# Patient Record
Sex: Male | Born: 1935 | Race: White | Hispanic: No | Marital: Married | Smoking: Former smoker
Health system: Southern US, Community
[De-identification: ages and names within clinical notes are randomized; demographics above are authoritative.]

## PROBLEM LIST (undated history)

## (undated) DIAGNOSIS — K5792 Diverticulitis of intestine, part unspecified, without perforation or abscess without bleeding: Secondary | ICD-10-CM

## (undated) DIAGNOSIS — I5022 Chronic systolic (congestive) heart failure: Secondary | ICD-10-CM

## (undated) DIAGNOSIS — C9210 Chronic myeloid leukemia, BCR/ABL-positive, not having achieved remission: Secondary | ICD-10-CM

## (undated) DIAGNOSIS — T4145XA Adverse effect of unspecified anesthetic, initial encounter: Secondary | ICD-10-CM

## (undated) DIAGNOSIS — Z8719 Personal history of other diseases of the digestive system: Secondary | ICD-10-CM

## (undated) DIAGNOSIS — I255 Ischemic cardiomyopathy: Secondary | ICD-10-CM

## (undated) DIAGNOSIS — N183 Chronic kidney disease, stage 3 unspecified: Secondary | ICD-10-CM

## (undated) DIAGNOSIS — E785 Hyperlipidemia, unspecified: Secondary | ICD-10-CM

## (undated) DIAGNOSIS — K2270 Barrett's esophagus without dysplasia: Secondary | ICD-10-CM

## (undated) DIAGNOSIS — I6529 Occlusion and stenosis of unspecified carotid artery: Secondary | ICD-10-CM

## (undated) DIAGNOSIS — T8859XA Other complications of anesthesia, initial encounter: Secondary | ICD-10-CM

## (undated) DIAGNOSIS — G4730 Sleep apnea, unspecified: Secondary | ICD-10-CM

## (undated) DIAGNOSIS — R490 Dysphonia: Secondary | ICD-10-CM

## (undated) DIAGNOSIS — I2510 Atherosclerotic heart disease of native coronary artery without angina pectoris: Secondary | ICD-10-CM

## (undated) DIAGNOSIS — Z87442 Personal history of urinary calculi: Secondary | ICD-10-CM

## (undated) DIAGNOSIS — J381 Polyp of vocal cord and larynx: Secondary | ICD-10-CM

## (undated) DIAGNOSIS — K631 Perforation of intestine (nontraumatic): Secondary | ICD-10-CM

## (undated) HISTORY — DX: Polyp of vocal cord and larynx: J38.1

## (undated) HISTORY — DX: Barrett's esophagus without dysplasia: K22.70

## (undated) HISTORY — DX: Occlusion and stenosis of unspecified carotid artery: I65.29

## (undated) HISTORY — PX: CHOLECYSTECTOMY: SHX55

## (undated) HISTORY — PX: BOWEL RESECTION: SHX1257

## (undated) HISTORY — PX: LAPAROSCOPIC CHOLECYSTECTOMY: SUR755

## (undated) HISTORY — PX: EYE SURGERY: SHX253

## (undated) HISTORY — DX: Dysphonia: R49.0

## (undated) HISTORY — PX: CAROTID ENDARTERECTOMY: SUR193

## (undated) HISTORY — PX: INGUINAL HERNIA REPAIR: SHX194

---
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
## 2009-06-07 ENCOUNTER — Emergency Department (HOSPITAL_BASED_OUTPATIENT_CLINIC_OR_DEPARTMENT_OTHER): Admission: EM | Admit: 2009-06-07 | Discharge: 2009-06-07 | Payer: Self-pay | Admitting: Emergency Medicine

## 2012-11-15 ENCOUNTER — Inpatient Hospital Stay (HOSPITAL_BASED_OUTPATIENT_CLINIC_OR_DEPARTMENT_OTHER)
Admission: EM | Admit: 2012-11-15 | Discharge: 2012-11-20 | DRG: 871 | Disposition: A | Discharge status: Home / Self Care | Payer: Medicare Other | Attending: Internal Medicine | Admitting: Internal Medicine

## 2012-11-15 ENCOUNTER — Emergency Department (HOSPITAL_BASED_OUTPATIENT_CLINIC_OR_DEPARTMENT_OTHER): Payer: Medicare Other

## 2012-11-15 ENCOUNTER — Encounter (HOSPITAL_BASED_OUTPATIENT_CLINIC_OR_DEPARTMENT_OTHER): Payer: Self-pay | Admitting: *Deleted

## 2012-11-15 DIAGNOSIS — E860 Dehydration: Secondary | ICD-10-CM

## 2012-11-15 DIAGNOSIS — Z66 Do not resuscitate: Secondary | ICD-10-CM | POA: Diagnosis present

## 2012-11-15 DIAGNOSIS — E872 Acidosis, unspecified: Secondary | ICD-10-CM | POA: Diagnosis present

## 2012-11-15 DIAGNOSIS — N170 Acute kidney failure with tubular necrosis: Secondary | ICD-10-CM | POA: Diagnosis present

## 2012-11-15 DIAGNOSIS — R197 Diarrhea, unspecified: Secondary | ICD-10-CM | POA: Diagnosis present

## 2012-11-15 DIAGNOSIS — K5732 Diverticulitis of large intestine without perforation or abscess without bleeding: Secondary | ICD-10-CM

## 2012-11-15 DIAGNOSIS — N179 Acute kidney failure, unspecified: Secondary | ICD-10-CM

## 2012-11-15 DIAGNOSIS — A419 Sepsis, unspecified organism: Principal | ICD-10-CM | POA: Diagnosis present

## 2012-11-15 DIAGNOSIS — K5792 Diverticulitis of intestine, part unspecified, without perforation or abscess without bleeding: Secondary | ICD-10-CM

## 2012-11-15 DIAGNOSIS — E876 Hypokalemia: Secondary | ICD-10-CM

## 2012-11-15 LAB — URINALYSIS, ROUTINE W REFLEX MICROSCOPIC
Glucose, UA: NEGATIVE mg/dL
Ketones, ur: 15 mg/dL — AB
Nitrite: NEGATIVE
Protein, ur: 100 mg/dL — AB
Specific Gravity, Urine: 1.02 (ref 1.005–1.030)
Urobilinogen, UA: 1 mg/dL (ref 0.0–1.0)
pH: 5 (ref 5.0–8.0)

## 2012-11-15 LAB — CBC WITH DIFFERENTIAL/PLATELET
Band Neutrophils: 9 % (ref 0–10)
Basophils Absolute: 0 10*3/uL (ref 0.0–0.1)
Basophils Relative: 0 % (ref 0–1)
Eosinophils Absolute: 0 10*3/uL (ref 0.0–0.7)
Eosinophils Relative: 0 % (ref 0–5)
HCT: 52 % (ref 39.0–52.0)
Hemoglobin: 18.3 g/dL — ABNORMAL HIGH (ref 13.0–17.0)
Lymphocytes Relative: 8 % — ABNORMAL LOW (ref 12–46)
Lymphs Abs: 0.5 10*3/uL — ABNORMAL LOW (ref 0.7–4.0)
MCH: 29.1 pg (ref 26.0–34.0)
MCHC: 35.2 g/dL (ref 30.0–36.0)
MCV: 82.7 fL (ref 78.0–100.0)
Monocytes Absolute: 0.8 10*3/uL (ref 0.1–1.0)
Monocytes Relative: 13 % — ABNORMAL HIGH (ref 3–12)
Neutro Abs: 5.2 10*3/uL (ref 1.7–7.7)
Neutrophils Relative %: 70 % (ref 43–77)
Platelets: 220 10*3/uL (ref 150–400)
RBC: 6.29 MIL/uL — ABNORMAL HIGH (ref 4.22–5.81)
RDW: 14.9 % (ref 11.5–15.5)
WBC: 6.5 10*3/uL (ref 4.0–10.5)

## 2012-11-15 LAB — COMPREHENSIVE METABOLIC PANEL
ALT: 51 U/L (ref 0–53)
AST: 225 U/L — ABNORMAL HIGH (ref 0–37)
Albumin: 3.7 g/dL (ref 3.5–5.2)
Alkaline Phosphatase: 138 U/L — ABNORMAL HIGH (ref 39–117)
BUN: 56 mg/dL — ABNORMAL HIGH (ref 6–23)
CO2: 14 mEq/L — ABNORMAL LOW (ref 19–32)
Calcium: 8.2 mg/dL — ABNORMAL LOW (ref 8.4–10.5)
Chloride: 93 mEq/L — ABNORMAL LOW (ref 96–112)
Creatinine, Ser: 4.4 mg/dL — ABNORMAL HIGH (ref 0.50–1.35)
GFR calc Af Amer: 13 mL/min — ABNORMAL LOW (ref >90)
GFR calc non Af Amer: 11 mL/min — ABNORMAL LOW (ref >90)
Glucose, Bld: 125 mg/dL — ABNORMAL HIGH (ref 70–99)
Potassium: 3 mEq/L — ABNORMAL LOW (ref 3.5–5.1)
Sodium: 131 mEq/L — ABNORMAL LOW (ref 135–145)
Total Bilirubin: 0.7 mg/dL (ref 0.3–1.2)
Total Protein: 8.3 g/dL (ref 6.0–8.3)

## 2012-11-15 LAB — URINE MICROSCOPIC-ADD ON

## 2012-11-15 LAB — LACTIC ACID, PLASMA: Lactic Acid, Venous: 2.1 mmol/L (ref 0.5–2.2)

## 2012-11-15 LAB — LIPASE, BLOOD: Lipase: 12 U/L (ref 11–59)

## 2012-11-15 IMAGING — CT CT ABD-PELV W/O CM
2 of 4 series · 15 of 46 positions shown, 17 images · non-contrast
Comparison: Report from CT of the abdomen and pelvis performed
[DATE]

CLINICAL DATA: Vomiting for 3 days.

CT ABDOMEN AND PELVIS WITHOUT CONTRAST
TECHNIQUE: Multidetector CT imaging of the abdomen and pelvis was
performed following the standard protocol without intravenous
contrast.

[Series 2: abd/pelvis 5.0 b31f · axial · 0.73mm/px · z∈[-468,-38]mm · 12 of 94 slices shown, 14 images]
[im 4/94  soft-tissue]
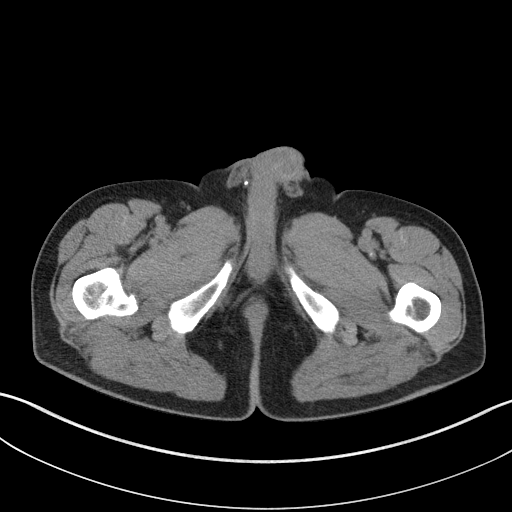
[im 4/94  bone]
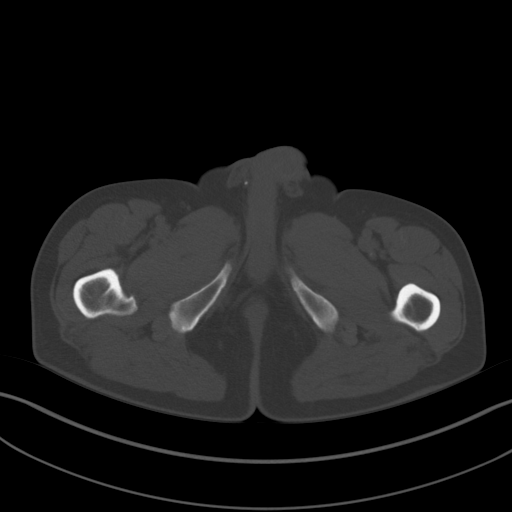
[im 12/94  soft-tissue]
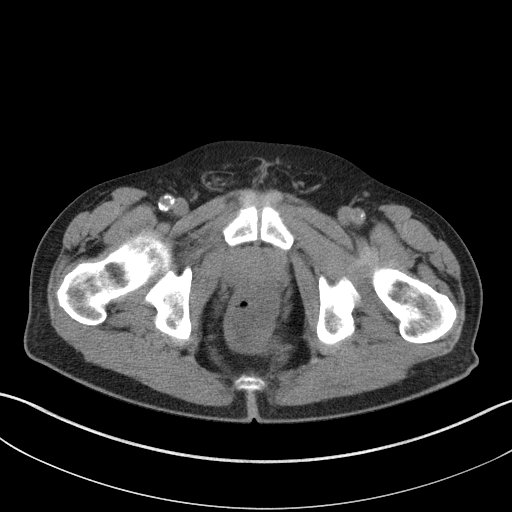
[im 20/94  soft-tissue]
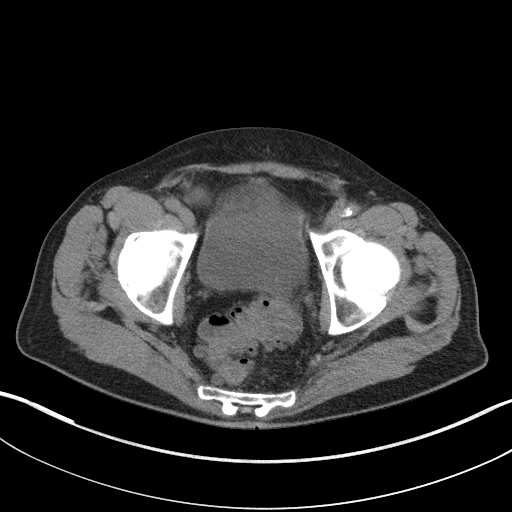
[im 28/94  soft-tissue]
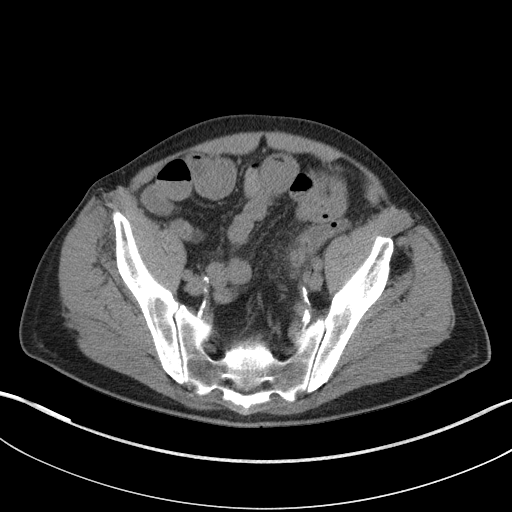
[im 35/94  soft-tissue]
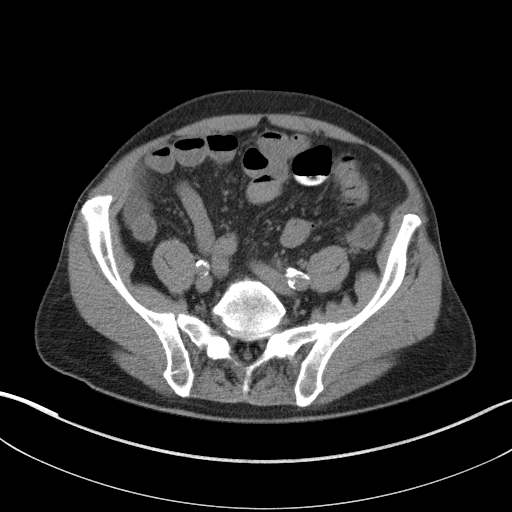
[im 43/94  soft-tissue]
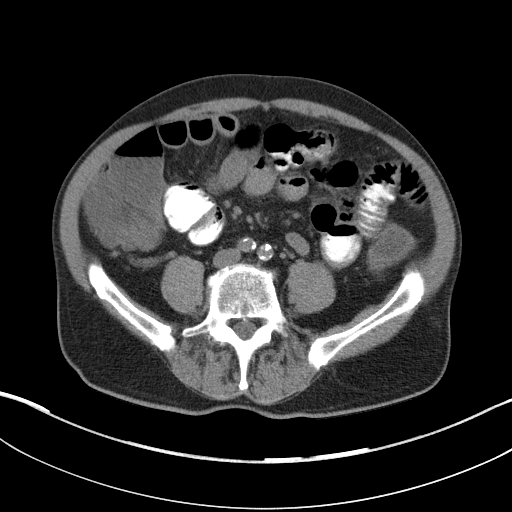
[im 51/94  soft-tissue]
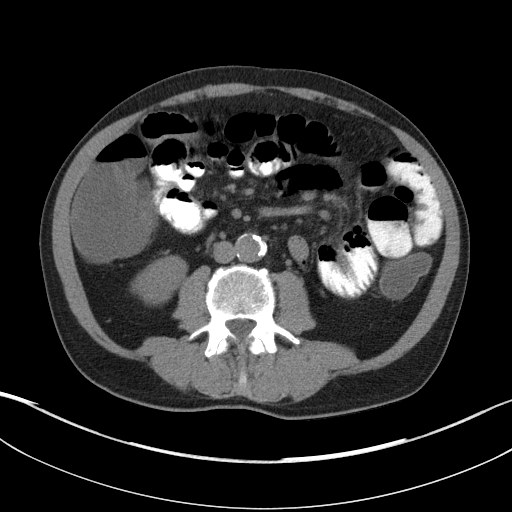
[im 59/94  soft-tissue]
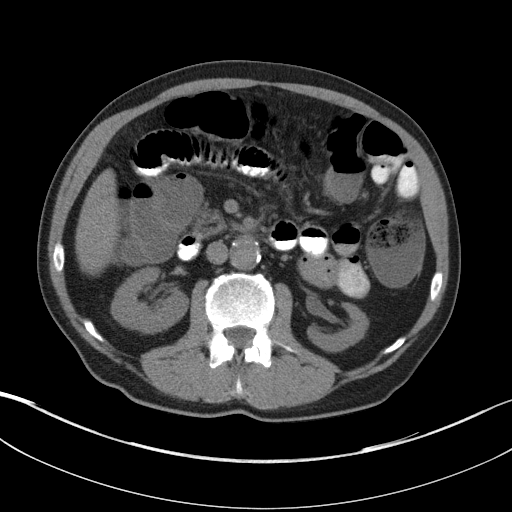
[im 66/94  soft-tissue]
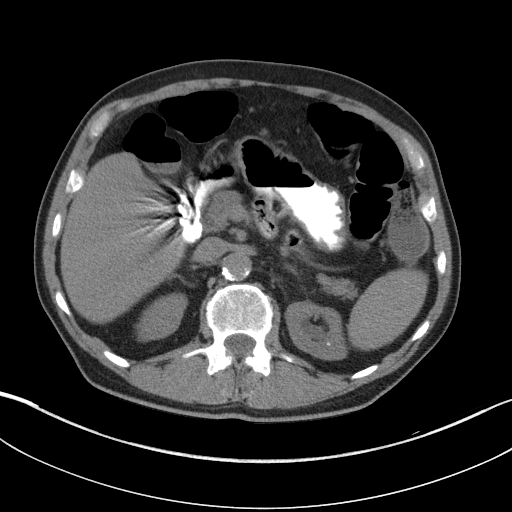
[im 66/94  bone]
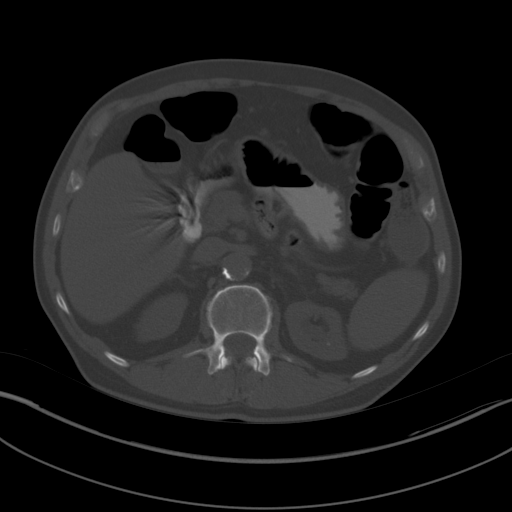
[im 74/94  soft-tissue]
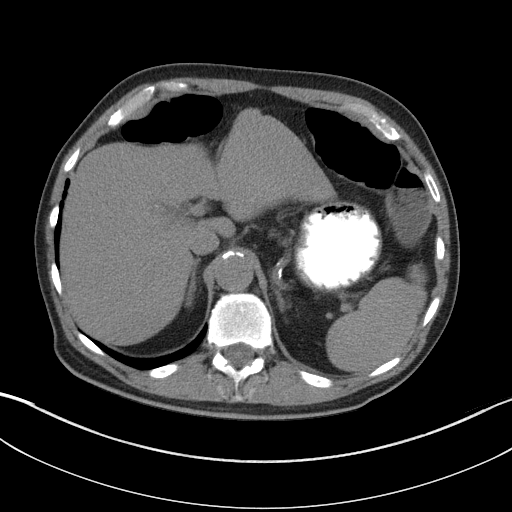
[im 82/94  soft-tissue]
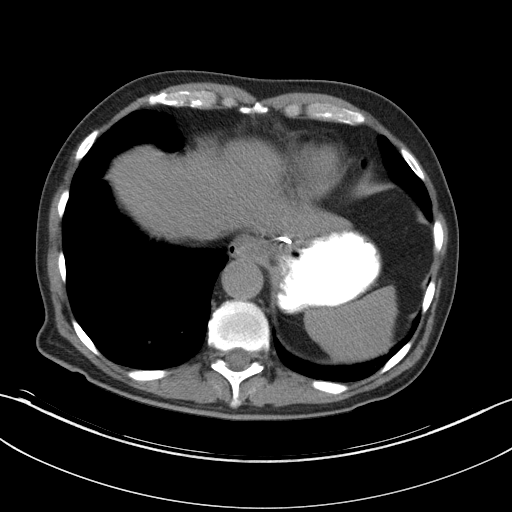
[im 90/94  soft-tissue]
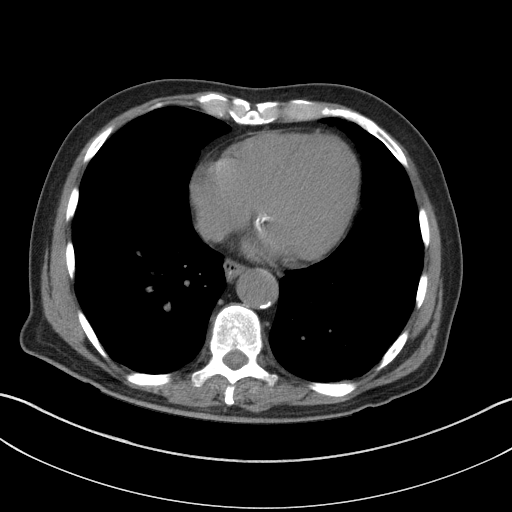

[Series 5: abd/pelvis 3.0 coronal · coronal · 0.88mm/px · 3 of 86 slices shown]
[im 29/86  soft-tissue]
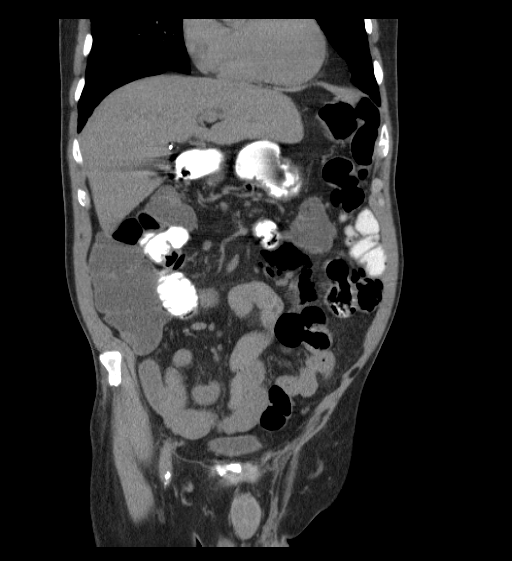
[im 38/86  soft-tissue]
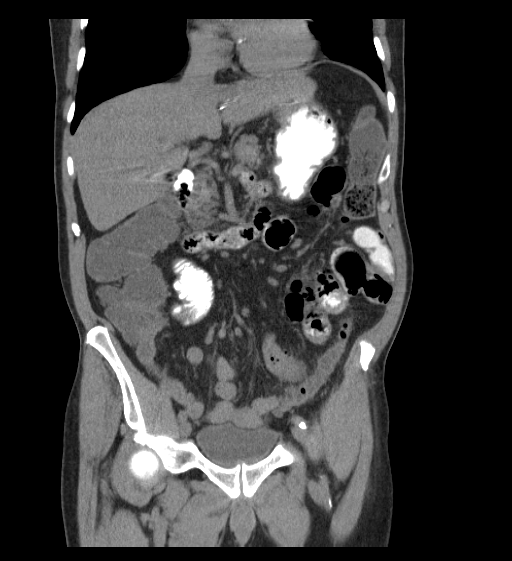
[im 48/86  soft-tissue]
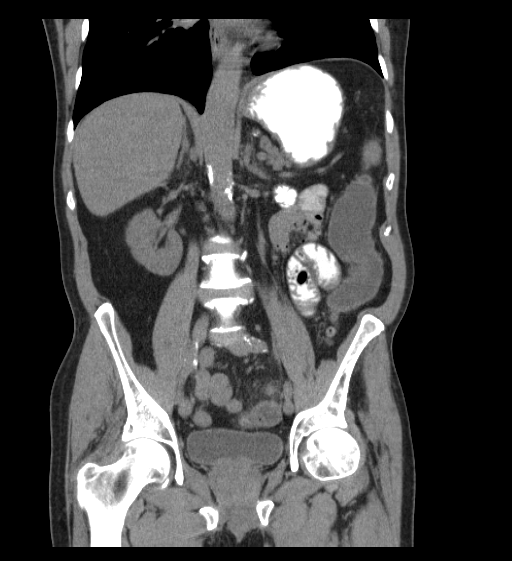

[15 of 46 positions shown; findings below may reference images not displayed]

FINDINGS: Minimal bibasilar atelectasis is noted; scattered
emphysematous change is seen at the lung bases.  Mild scattered
coronary artery calcifications are noted.  Postoperative change is
noted along the gastroesophageal junction.

The liver and spleen are unremarkable in appearance. The patient is
status post cholecystectomy, with clips noted at the gallbladder
fossa.  The slightly decreased attenuation at the pancreatic head
is thought to reflect beam hardening artifact from adjacent
contrast.  The pancreas and adrenal glands are grossly unremarkable
in appearance.

Scattered small left renal stones are seen, measuring up to 6 mm in
size.  Mild left-sided renal pelvicaliectasis remains within normal
limits, and likely reflects the inflammatory process of the sigmoid
colon impressing against the distal left ureter. The right kidney
is grossly unremarkable in appearance.  Minimal nonspecific right-
sided perinephric stranding is seen.

The small bowel is unremarkable in appearance.  The stomach is
within normal limits.  No acute vascular abnormalities are seen.
Numerous scattered mildly prominent mesenteric nodes are seen,
nonspecific in appearance.  Relatively diffuse calcification is
noted along the abdominal aorta and its branches, with mild ectasia
of the distal abdominal aorta.

The appendix is normal in caliber, without evidence for
appendicitis.

There is focal soft tissue inflammation noted along the mid sigmoid
colon, with associated inflamed diverticula, compatible with mild
acute diverticulitis.  Additional diverticulosis is seen scattered
about the sigmoid colon.  The colon is largely filled with fluid
and is otherwise unremarkable in appearance.  There is no evidence
of perforation or abscess formation.  No significant free fluid is
seen.

The bladder is mildly distended and grossly unremarkable.  The
prostate is borderline enlarged, measuring 4.9 cm in transverse
dimension.  No inguinal lymphadenopathy is seen.

No acute osseous abnormalities are identified.  Vacuum phenomenon
and disc space narrowing are noted at L4-L5, with mild associated
endplate sclerotic change.  An associated disc protrusion is seen,
without evidence of impression on exiting nerve roots.
IMPRESSION: 1.  Mild acute diverticulitis noted along the mid sigmoid colon,
with associated focal soft tissue inflammation.  No evidence of
perforation or abscess formation; no free fluid seen.
2.  Diverticulosis scattered about the sigmoid colon.
3.  Relatively diffuse calcification along the abdominal aorta and
its branches.
4.  Scattered small left renal stones seen, measuring up to 6 mm in
size.  Mild left-sided pelvicaliectasis appears to reflect the
inflammatory process at the sigmoid colon impressing against the
distal left ureter; no evidence of hydronephrosis.
5.  Mild scattered coronary artery calcifications seen.
6.  Scattered emphysematous change at the lung bases.
7.  Borderline enlarged prostate.

## 2012-11-15 MED ORDER — ONDANSETRON HCL 4 MG/2ML IJ SOLN
4.0000 mg | Freq: Once | INTRAMUSCULAR | Status: AC
  Administered 2012-11-15: 4 mg via INTRAVENOUS
  Filled 2012-11-15: qty 2

## 2012-11-15 MED ORDER — SODIUM CHLORIDE 0.9 % IV BOLUS (SEPSIS)
1000.0000 mL | Freq: Once | INTRAVENOUS | Status: AC
  Administered 2012-11-15: 1000 mL via INTRAVENOUS

## 2012-11-15 MED ORDER — METRONIDAZOLE IN NACL 5-0.79 MG/ML-% IV SOLN
500.0000 mg | Freq: Once | INTRAVENOUS | Status: AC
  Administered 2012-11-16: 500 mg via INTRAVENOUS
  Filled 2012-11-15: qty 100

## 2012-11-15 MED ORDER — MORPHINE SULFATE 4 MG/ML IJ SOLN
4.0000 mg | Freq: Once | INTRAMUSCULAR | Status: AC
  Administered 2012-11-15: 4 mg via INTRAVENOUS
  Filled 2012-11-15: qty 1

## 2012-11-15 MED ORDER — IOHEXOL 300 MG/ML  SOLN
25.0000 mL | INTRAMUSCULAR | Status: AC
  Administered 2012-11-15 (×2): 25 mL via ORAL

## 2012-11-15 MED ORDER — SODIUM CHLORIDE 0.9 % IV BOLUS (SEPSIS): 1000.0000 mL | Freq: Once | INTRAVENOUS | Status: DC

## 2012-11-15 MED ORDER — CIPROFLOXACIN IN D5W 400 MG/200ML IV SOLN
400.0000 mg | Freq: Once | INTRAVENOUS | Status: AC
  Administered 2012-11-15: 400 mg via INTRAVENOUS
  Filled 2012-11-15: qty 200

## 2012-11-15 MED ORDER — SODIUM CHLORIDE 0.9 % IV SOLN: INTRAVENOUS | Status: DC

## 2012-11-15 NOTE — ED Notes (Signed)
Patient transported to CT 

## 2012-11-15 NOTE — ED Notes (Signed)
Kathi Der, phone [phone].  Call when we know the dispo pla

## 2012-11-15 NOTE — ED Notes (Signed)
Pt to triage in w/c with wife reporting pt with vomiting x 3 days, seen at [facility] and given medications. Cont with vomiting, unable to retain po.

## 2012-11-15 NOTE — ED Notes (Signed)
Patient finished w/ oral contrast, tolerated well.

## 2012-11-15 NOTE — ED Provider Notes (Addendum)
History     CSN: [id number]  Arrival date & time 11/15/12  1845   First MD Initiated Contact with Patient 11/15/12 1922      Chief Complaint  Patient presents with  . Emesis    (Consider location/radiation/quality/duration/timing/severity/associated sxs/prior treatment) Patient is a 78 y.o. male presenting with vomiting and abdominal pain. The history is provided by the patient and the spouse.  Emesis  This is a new problem. Episode onset: 3 days ago. The problem occurs 2 to 4 times per day. The problem has not changed since onset.The emesis has an appearance of stomach contents. There has been no fever. Associated symptoms include abdominal pain, chills and diarrhea. Associated symptoms comments: No bloody stools. Risk factors: No ill contacts, bad food exposure or, recent travel or antibiotics.  Abdominal Pain The primary symptoms of the illness include abdominal pain, vomiting and diarrhea.  The abdominal pain began yesterday. The abdominal pain has been gradually worsening since its onset. The abdominal pain is located in the LLQ and suprapubic region. The abdominal pain does not radiate. The severity of the abdominal pain is 8/10. The abdominal pain is relieved by nothing. The abdominal pain is exacerbated by movement and eating.  Additional symptoms associated with the illness include chills and anorexia. Symptoms associated with the illness do not include urgency, frequency or back pain.    History reviewed. No pertinent past medical history.  History reviewed. No pertinent past surgical history.  History reviewed. No pertinent family history.  History  Substance Use Topics  . Smoking status: Not on file  . Smokeless tobacco: Not on file  . Alcohol Use: Not on file      Review of Systems  Constitutional: Positive for chills.  Gastrointestinal: Positive for vomiting, abdominal pain, diarrhea and anorexia.  Genitourinary: Negative for urgency and frequency.    Musculoskeletal: Negative for back pain.  All other systems reviewed and are negative.    Allergies  Review of patient's allergies indicates no known allergies.  Home Medications  No current outpatient prescriptions on file.  BP 114/70  Pulse 88  Resp 20  SpO2 98%  Physical Exam  Constitutional: He is oriented to person, place, and time. He appears well-developed and well-nourished. He appears distressed.  HENT:  Head: Normocephalic and atraumatic.  Right Ear: External ear normal.  Left Ear: External ear normal.  Mouth/Throat: Oropharynx is clear and moist. Mucous membranes are dry.  Eyes: Conjunctivae normal and EOM are normal. Pupils are equal, round, and reactive to light. Right eye exhibits no discharge.  Neck: Normal range of motion. Neck supple.  Cardiovascular: Normal rate, regular rhythm, normal heart sounds and intact distal pulses.   No murmur heard. Pulmonary/Chest: Tachypnea noted. No respiratory distress. He has no decreased breath sounds. He has no wheezes. He has no rhonchi. He has no rales.  Abdominal: Soft. Normal appearance and bowel sounds are normal. He exhibits distension. There is tenderness in the suprapubic area and left lower quadrant. There is guarding. There is no rebound and no CVA tenderness.  Musculoskeletal: Normal range of motion. He exhibits no edema and no tenderness.  Neurological: He is alert and oriented to person, place, and time.  Skin: Skin is warm and dry. No rash noted.  Psychiatric: He has a normal mood and affect.    ED Course  Procedures (including critical care time)  Labs Reviewed  CBC WITH DIFFERENTIAL - Abnormal; Notable for the following:    RBC 6.29 (*)  Hemoglobin 18.3 (*)     Lymphocytes Relative 8 (*)     Monocytes Relative 13 (*)     Lymphs Abs 0.5 (*)     All other components within normal limits  COMPREHENSIVE METABOLIC PANEL - Abnormal; Notable for the following:    Sodium 131 (*)     Potassium 3.0 (*)      Chloride 93 (*)     CO2 14 (*)     Glucose, Bld 125 (*)     BUN 56 (*)     Creatinine, Ser 4.40 (*)     Calcium 8.2 (*)     AST 225 (*)     Alkaline Phosphatase 138 (*)     GFR calc non Af Amer 11 (*)     GFR calc Af Amer 13 (*)     All other components within normal limits  URINALYSIS, ROUTINE W REFLEX MICROSCOPIC - Abnormal; Notable for the following:    Color, Urine AMBER (*)  BIOCHEMICALS MAY BE AFFECTED BY COLOR   APPearance TURBID (*)     Hgb urine dipstick LARGE (*)     Bilirubin Urine MODERATE (*)     Ketones, ur 15 (*)     Protein, ur 100 (*)     Leukocytes, UA SMALL (*)     All other components within normal limits  URINE MICROSCOPIC-ADD ON - Abnormal; Notable for the following:    Bacteria, UA FEW (*)     Casts GRANULAR CAST (*)  HYALINE CASTS   All other components within normal limits  LIPASE, BLOOD  LACTIC ACID, PLASMA  CLOSTRIDIUM DIFFICILE BY PCR  CLOSTRIDIUM DIFFICILE BY PCR   Ct Abdomen Pelvis Wo Contrast  11/15/2012  *RADIOLOGY REPORT*  Clinical Data: Vomiting for 3 days.  CT ABDOMEN AND PELVIS WITHOUT CONTRAST  Technique:  Multidetector CT imaging of the abdomen and pelvis was performed following the standard protocol without intravenous contrast.  Comparison: Report from CT of the abdomen and pelvis performed 04/08/2010  Findings: Minimal bibasilar atelectasis is noted; scattered emphysematous change is seen at the lung bases.  Mild scattered coronary artery calcifications are noted.  Postoperative change is noted along the gastroesophageal junction.  The liver and spleen are unremarkable in appearance. The patient is status post cholecystectomy, with clips noted at the gallbladder fossa.  The slightly decreased attenuation at the pancreatic head is thought to reflect beam hardening artifact from adjacent contrast.  The pancreas and adrenal glands are grossly unremarkable in appearance.  Scattered small left renal stones are seen, measuring up to 6 mm in size.   Mild left-sided renal pelvicaliectasis remains within normal limits, and likely reflects the inflammatory process of the sigmoid colon impressing against the distal left ureter. The right kidney is grossly unremarkable in appearance.  Minimal nonspecific right- sided perinephric stranding is seen.  The small bowel is unremarkable in appearance.  The stomach is within normal limits.  No acute vascular abnormalities are seen. Numerous scattered mildly prominent mesenteric nodes are seen, nonspecific in appearance.  Relatively diffuse calcification is noted along the abdominal aorta and its branches, with mild ectasia of the distal abdominal aorta.  The appendix is normal in caliber, without evidence for appendicitis.  There is focal soft tissue inflammation noted along the mid sigmoid colon, with associated inflamed diverticula, compatible with mild acute diverticulitis.  Additional diverticulosis is seen scattered about the sigmoid colon.  The colon is largely filled with fluid and is otherwise unremarkable in appearance.  There  is no evidence of perforation or abscess formation.  No significant free fluid is seen.  The bladder is mildly distended and grossly unremarkable.  The prostate is borderline enlarged, measuring 4.9 cm in transverse dimension.  No inguinal lymphadenopathy is seen.  No acute osseous abnormalities are identified.  Vacuum phenomenon and disc space narrowing are noted at L4-L5, with mild associated endplate sclerotic change.  An associated disc protrusion is seen, without evidence of impression on exiting nerve roots.  IMPRESSION:  1.  Mild acute diverticulitis noted along the mid sigmoid colon, with associated focal soft tissue inflammation.  No evidence of perforation or abscess formation; no free fluid seen. 2.  Diverticulosis scattered about the sigmoid colon. 3.  Relatively diffuse calcification along the abdominal aorta and its branches. 4.  Scattered small left renal stones seen, measuring  up to 6 mm in size.  Mild left-sided pelvicaliectasis appears to reflect the inflammatory process at the sigmoid colon impressing against the distal left ureter; no evidence of hydronephrosis. 5.  Mild scattered coronary artery calcifications seen. 6.  Scattered emphysematous change at the lung bases. 7.  Borderline enlarged prostate.   Original Report Authenticated By: Tonia Ghent, M.D.    CRITICAL CARE Performed by: Gwyneth Sprout   Total critical care time: 30  Critical care time was exclusive of separately billable procedures and treating other patients.  Critical care was necessary to treat or prevent imminent or life-threatening deterioration.  Critical care was time spent personally by me on the following activities: development of treatment plan with patient and/or surrogate as well as nursing, discussions with consultants, evaluation of patient's response to treatment, examination of patient, obtaining history from patient or surrogate, ordering and performing treatments and interventions, ordering and review of laboratory studies, ordering and review of radiographic studies, pulse oximetry and re-evaluation of patient's condition.   1. Acute renal failure   2. Diverticulitis   3. Dehydration       MDM   Patient with nausea, vomiting, diarrhea and abdominal pain for the last 3 days that's worsening. He was seen at urgent care earlier today and given an IM injection of pain and nausea medicine however he continues to feel badly eating here. Patient takes no medications and has no past medical history. He is status post cholecystectomy but states he still has his appendix. He denies any recent antibiotic use. On exam he is dehydrated with a left lower corner tenderness that is significant. Concern for diverticulitis versus extraction versus appendicitis versus UTI. Patient given IV fluids. CBC, CMP, lipase, UA, lactic acid, CT of the abdomen and pelvis pending.  11:30  PM Patient found to be in acute renal failure, also elevation in his liver enzymes which I also feel is from dehydration. CT showing acute diverticulitis which would be the cause for his lower abdominal pain. Patient was started on Cipro and Flagyl. He has no risk factors for C. difficile however he did have a loose stool here and it was sent for C. difficile and stool PCR. He is feeling better after IV fluids however due to the above issues he is receiving his third liter of fluid and he will be admitted for further care.      Gwyneth Sprout, MD 11/15/12 2252  Gwyneth Sprout, MD 11/15/12 1610  Gwyneth Sprout, MD 11/15/12 2340

## 2012-11-15 NOTE — Progress Notes (Addendum)
78 yo M 3 day history of N/V/D, mild diverticulitis, acute kidney failure.  No h/o CKD.  Initial labs c/w ATN and severe dehydration.

## 2012-11-16 ENCOUNTER — Inpatient Hospital Stay (HOSPITAL_COMMUNITY): Payer: Medicare Other

## 2012-11-16 ENCOUNTER — Encounter (HOSPITAL_COMMUNITY): Payer: Self-pay | Admitting: *Deleted

## 2012-11-16 DIAGNOSIS — E876 Hypokalemia: Secondary | ICD-10-CM | POA: Diagnosis present

## 2012-11-16 DIAGNOSIS — K5792 Diverticulitis of intestine, part unspecified, without perforation or abscess without bleeding: Secondary | ICD-10-CM | POA: Diagnosis present

## 2012-11-16 DIAGNOSIS — N179 Acute kidney failure, unspecified: Secondary | ICD-10-CM | POA: Diagnosis present

## 2012-11-16 DIAGNOSIS — E860 Dehydration: Secondary | ICD-10-CM | POA: Diagnosis present

## 2012-11-16 LAB — CBC
HCT: 43.8 % (ref 39.0–52.0)
Hemoglobin: 15.1 g/dL (ref 13.0–17.0)
MCH: 29.2 pg (ref 26.0–34.0)
MCHC: 34.5 g/dL (ref 30.0–36.0)
MCV: 84.7 fL (ref 78.0–100.0)
Platelets: 211 10*3/uL (ref 150–400)
RBC: 5.17 MIL/uL (ref 4.22–5.81)
RDW: 14.1 % (ref 11.5–15.5)
WBC: 13.9 10*3/uL — ABNORMAL HIGH (ref 4.0–10.5)

## 2012-11-16 LAB — BASIC METABOLIC PANEL
BUN: 59 mg/dL — ABNORMAL HIGH (ref 6–23)
CO2: 15 mEq/L — ABNORMAL LOW (ref 19–32)
Calcium: 7.6 mg/dL — ABNORMAL LOW (ref 8.4–10.5)
Chloride: 94 mEq/L — ABNORMAL LOW (ref 96–112)
Creatinine, Ser: 3.89 mg/dL — ABNORMAL HIGH (ref 0.50–1.35)
GFR calc Af Amer: 15 mL/min — ABNORMAL LOW (ref >90)
GFR calc non Af Amer: 13 mL/min — ABNORMAL LOW (ref >90)
Glucose, Bld: 92 mg/dL (ref 70–99)
Potassium: 3.2 mEq/L — ABNORMAL LOW (ref 3.5–5.1)
Sodium: 130 mEq/L — ABNORMAL LOW (ref 135–145)

## 2012-11-16 LAB — TSH: TSH: 4.564 u[IU]/mL — ABNORMAL HIGH (ref 0.350–4.500)

## 2012-11-16 LAB — CLOSTRIDIUM DIFFICILE BY PCR
Toxigenic C. Difficile by PCR: NEGATIVE
Toxigenic C. Difficile by PCR: NEGATIVE

## 2012-11-16 LAB — PROTIME-INR
INR: 1.38 (ref 0.00–1.49)
Prothrombin Time: 16.6 seconds — ABNORMAL HIGH (ref 11.6–15.2)

## 2012-11-16 LAB — CREATININE, URINE, RANDOM: Creatinine, Urine: 223.36 mg/dL

## 2012-11-16 LAB — MAGNESIUM: Magnesium: 1 mg/dL — ABNORMAL LOW (ref 1.5–2.5)

## 2012-11-16 LAB — SODIUM, URINE, RANDOM: Sodium, Ur: 17 mEq/L

## 2012-11-16 IMAGING — US US RENAL
1 series · 14 of 25 positions shown · non-contrast
Comparison: CT [DATE]

CLINICAL DATA: Acute renal failure.

RENAL/URINARY TRACT ULTRASOUND COMPLETE

[Series 1: us renal · 0.30mm/px · 14 of 44 slices shown]
[im 1/44]
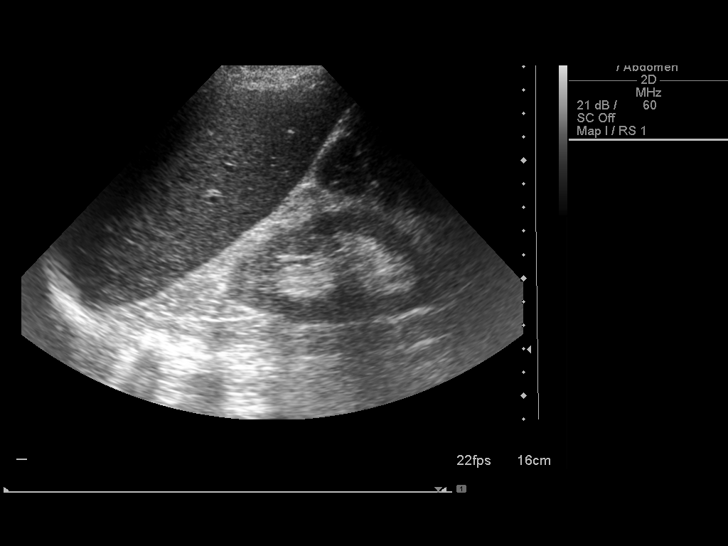
[im 4/44]
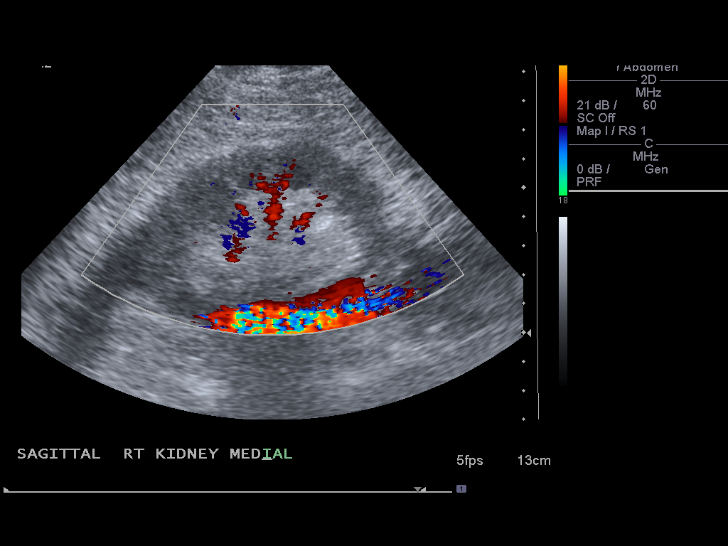
[im 8/44]
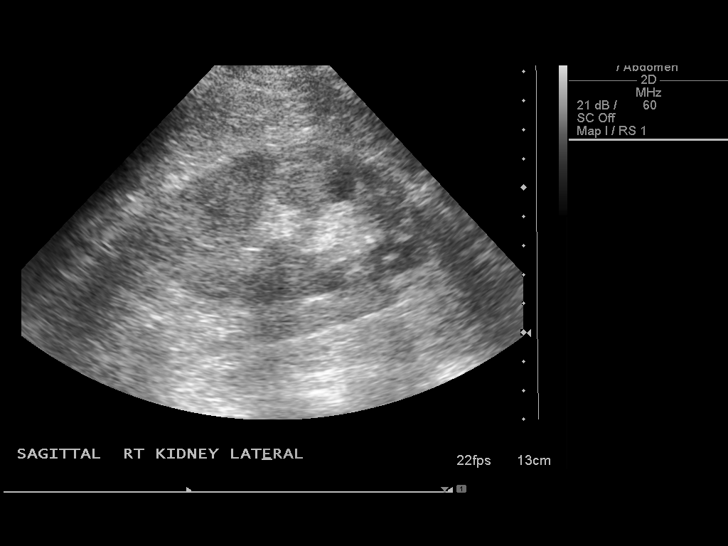
[im 11/44]
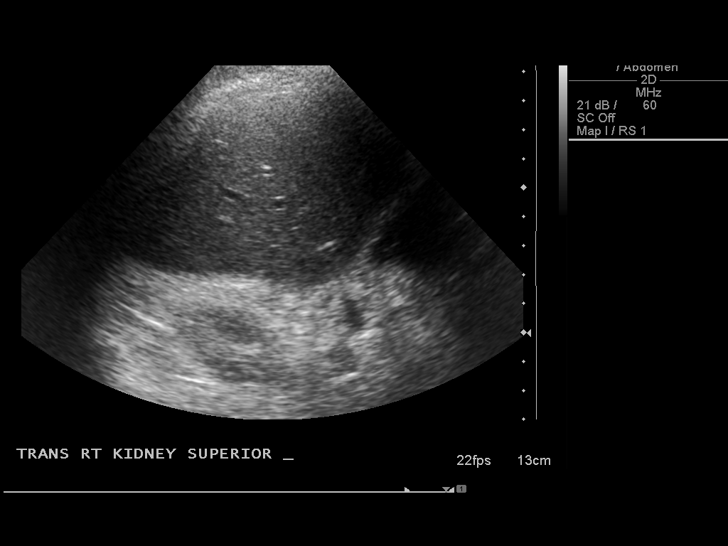
[im 15/44]
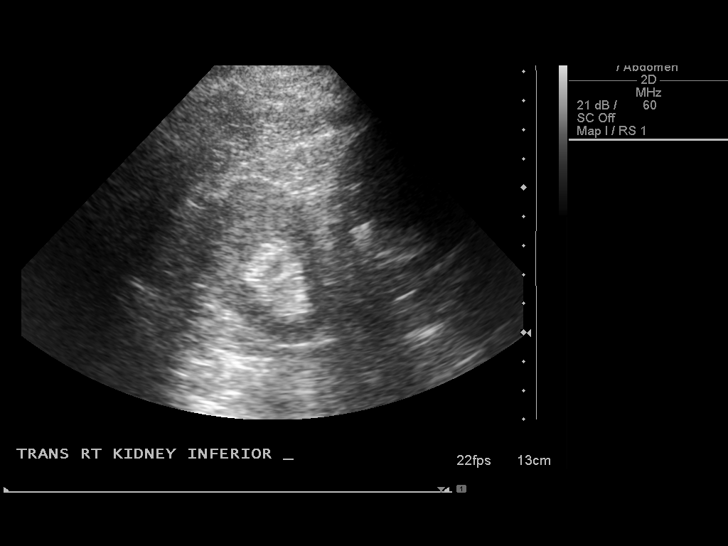
[im 17/44]
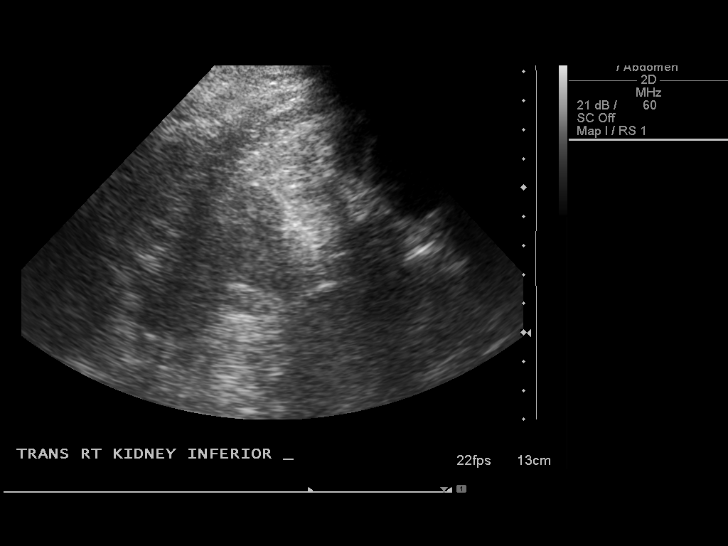
[im 20/44]
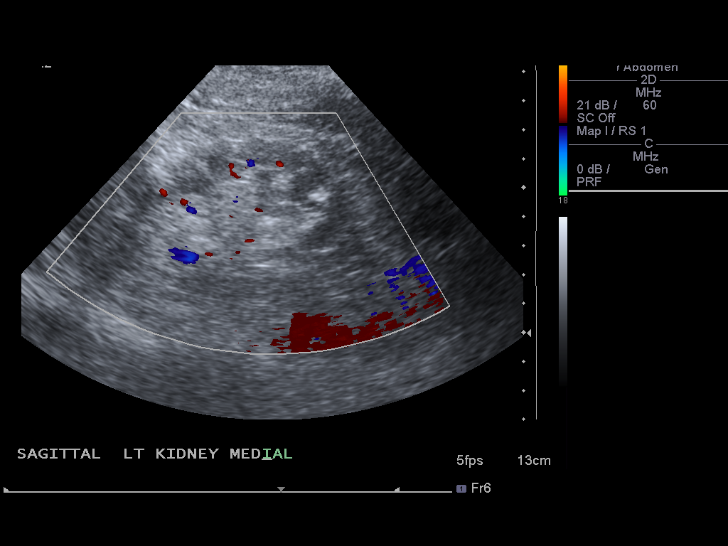
[im 24/44]
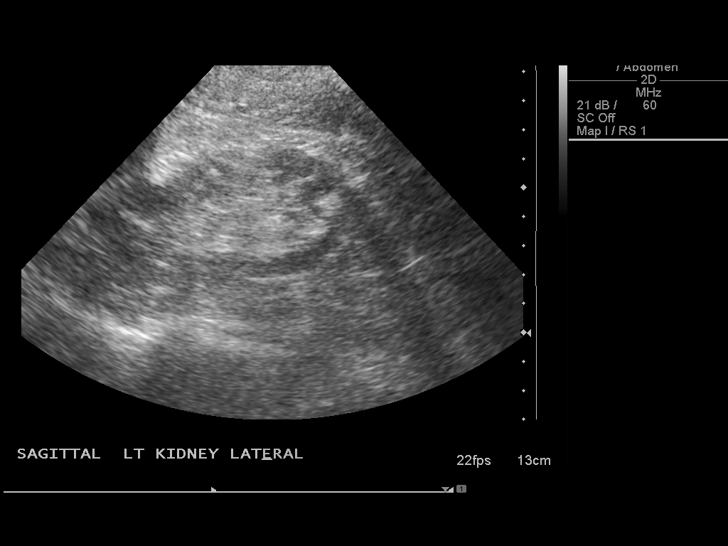
[im 27/44]
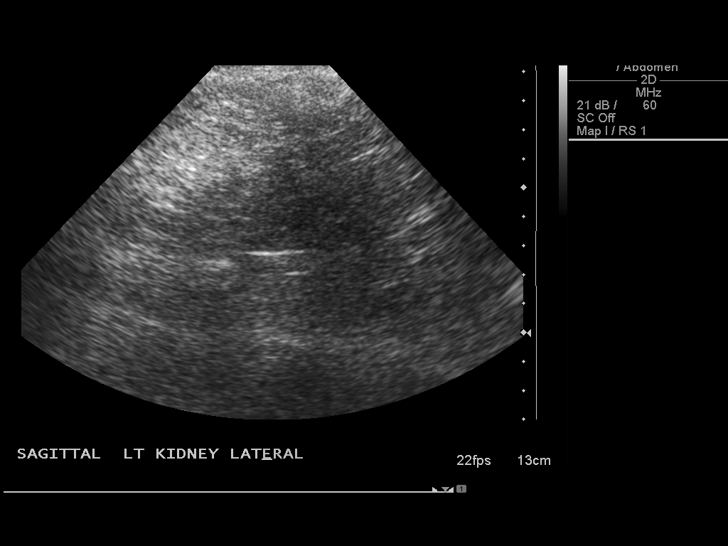
[im 29/44]
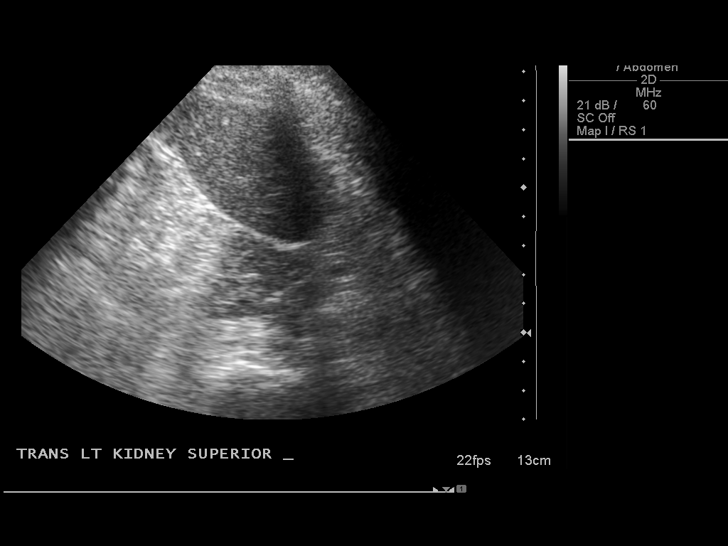
[im 33/44]
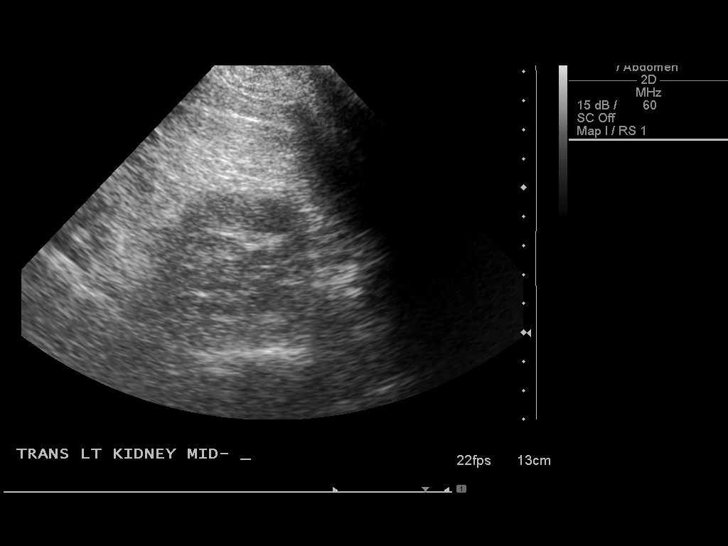
[im 36/44]
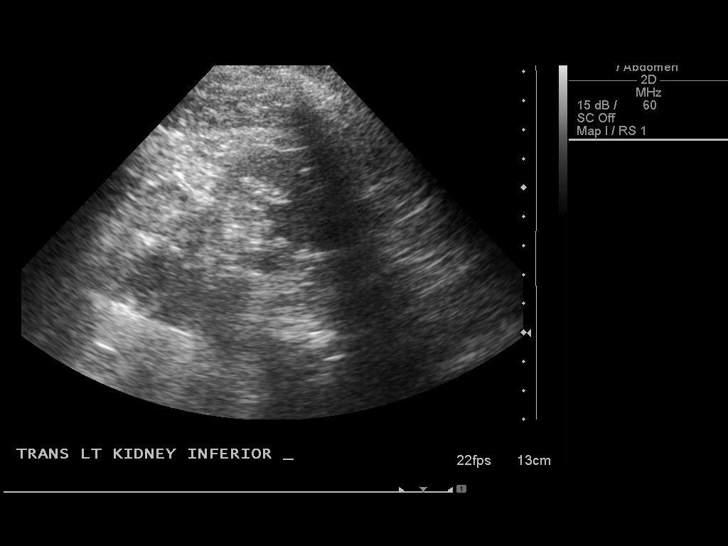
[im 40/44]
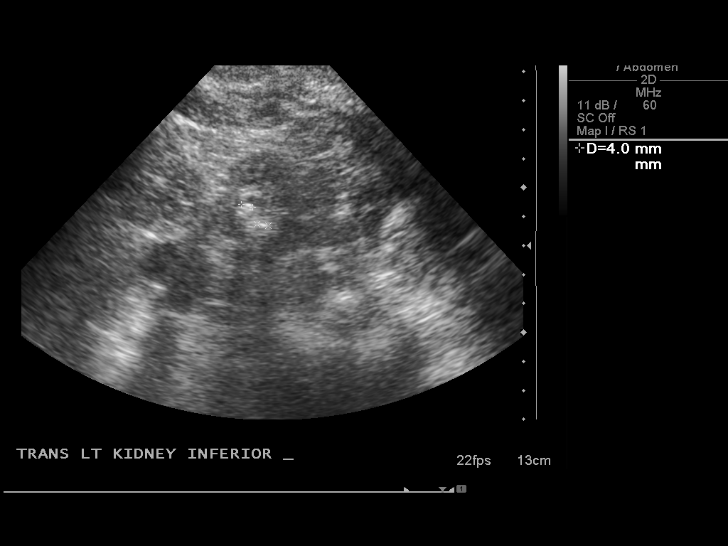
[im 44/44]
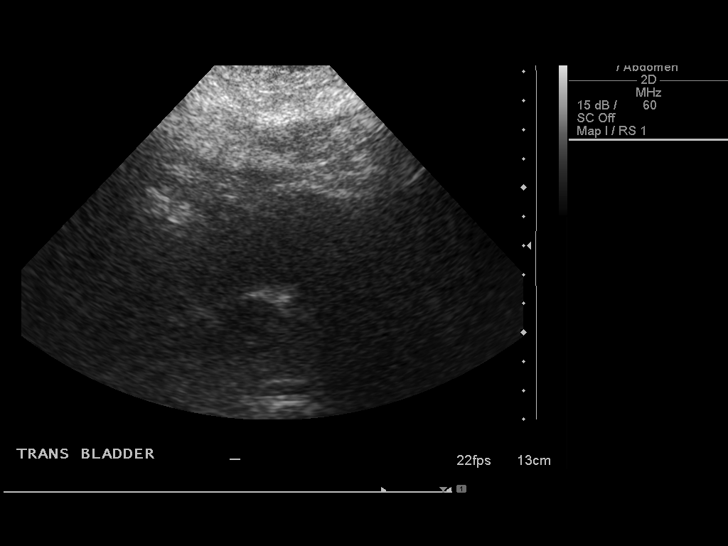

[14 of 25 positions shown; findings below may reference images not displayed]

FINDINGS: Right Kidney:  10.9 cm.  No focal abnormality.  No hydronephrosis.
Normal echotexture.

Left Kidney:  10.0 cm.  Mild cortical thinning.  Echogenic
shadowing foci within the left kidney compatible with
nonobstructing stones as seen on prior CT.  No hydronephrosis.
Normal echotexture.

Bladder:   Decompressed.  Not well visualized.
IMPRESSION: Mild cortical thinning within the left kidney.  Left
nephrolithiasis.  No acute findings.

## 2012-11-16 MED ORDER — POTASSIUM CHLORIDE IN NACL 20-0.9 MEQ/L-% IV SOLN
INTRAVENOUS | Status: DC
  Filled 2012-11-16 (×2): qty 1000

## 2012-11-16 MED ORDER — METRONIDAZOLE IN NACL 5-0.79 MG/ML-% IV SOLN
500.0000 mg | Freq: Three times a day (TID) | INTRAVENOUS | Status: DC
  Administered 2012-11-16 (×3): 500 mg via INTRAVENOUS
  Filled 2012-11-16 (×5): qty 100

## 2012-11-16 MED ORDER — HEPARIN SODIUM (PORCINE) 5000 UNIT/ML IJ SOLN
5000.0000 [IU] | Freq: Three times a day (TID) | INTRAMUSCULAR | Status: DC
  Administered 2012-11-16 – 2012-11-20 (×12): 5000 [IU] via SUBCUTANEOUS
  Filled 2012-11-16 (×15): qty 1

## 2012-11-16 MED ORDER — POTASSIUM CHLORIDE IN NACL 20-0.9 MEQ/L-% IV SOLN: INTRAVENOUS | Status: DC

## 2012-11-16 MED ORDER — POTASSIUM CHLORIDE CRYS ER 20 MEQ PO TBCR
40.0000 meq | EXTENDED_RELEASE_TABLET | Freq: Once | ORAL | Status: AC
  Administered 2012-11-16: 40 meq via ORAL
  Filled 2012-11-16: qty 2

## 2012-11-16 MED ORDER — SODIUM CHLORIDE 0.9 % IV BOLUS (SEPSIS): 500.0000 mL | Freq: Once | INTRAVENOUS | Status: DC

## 2012-11-16 MED ORDER — CIPROFLOXACIN IN D5W 400 MG/200ML IV SOLN
400.0000 mg | Freq: Two times a day (BID) | INTRAVENOUS | Status: DC
  Administered 2012-11-16 (×2): 400 mg via INTRAVENOUS
  Filled 2012-11-16 (×4): qty 200

## 2012-11-16 MED ORDER — HYDROCODONE-ACETAMINOPHEN 5-325 MG PO TABS
1.0000 | ORAL_TABLET | ORAL | Status: DC | PRN
  Administered 2012-11-16 – 2012-11-19 (×5): 1 via ORAL
  Filled 2012-11-16 (×5): qty 1

## 2012-11-16 MED ORDER — SODIUM BICARBONATE 650 MG PO TABS
650.0000 mg | ORAL_TABLET | Freq: Four times a day (QID) | ORAL | Status: DC
  Administered 2012-11-16 – 2012-11-20 (×18): 650 mg via ORAL
  Filled 2012-11-16 (×21): qty 1

## 2012-11-16 MED ORDER — POTASSIUM CHLORIDE IN NACL 20-0.9 MEQ/L-% IV SOLN
INTRAVENOUS | Status: AC
  Administered 2012-11-16: 03:00:00 via INTRAVENOUS
  Filled 2012-11-16: qty 1000

## 2012-11-16 MED ORDER — GUAIFENESIN-DM 100-10 MG/5ML PO SYRP
5.0000 mL | ORAL_SOLUTION | ORAL | Status: DC | PRN
  Filled 2012-11-16: qty 5

## 2012-11-16 MED ORDER — ONDANSETRON HCL 4 MG PO TABS: 4.0000 mg | ORAL_TABLET | Freq: Four times a day (QID) | ORAL | Status: DC | PRN

## 2012-11-16 MED ORDER — ONDANSETRON HCL 4 MG/2ML IJ SOLN: 4.0000 mg | Freq: Four times a day (QID) | INTRAMUSCULAR | Status: DC | PRN

## 2012-11-16 MED ORDER — BOOST / RESOURCE BREEZE PO LIQD
1.0000 | Freq: Three times a day (TID) | ORAL | Status: DC
  Administered 2012-11-16 – 2012-11-19 (×10): 1 via ORAL

## 2012-11-16 MED ORDER — ALBUTEROL SULFATE (5 MG/ML) 0.5% IN NEBU: 2.5000 mg | INHALATION_SOLUTION | RESPIRATORY_TRACT | Status: DC | PRN

## 2012-11-16 NOTE — ED Notes (Addendum)
Disregard previous weight documentation. Wrong patient,weight incorrect.

## 2012-11-16 NOTE — Progress Notes (Signed)
Patient seen and evaluated earlier this AM by my associate.  Please review his H and P for further details.  Patient currently mentions that he feels improved.   ARF: - Creatinine improving on IVF's. Most likely due to prerenal azotemia - Renal u/s pending but Abdominal CT scan mentions the following regarding patient's kidneys:  Scattered small left renal stones are seen, measuring up to 6 mm in  size. Mild left-sided renal pelvicaliectasis remains within normal  limits, and likely reflects the inflammatory process of the sigmoid  colon impressing against the distal left ureter. The right kidney  is grossly unremarkable in appearance. Minimal nonspecific right-  sided perinephric stranding is seen. - Will plan on continuing IVF at this juncture  Diverticulitis - continue antibiotic regimen - CT scan reports no evidence of perforation or abscess formation  Continue to monitor  Joydan Gretzinger, [facility]

## 2012-11-16 NOTE — Progress Notes (Signed)
INITIAL NUTRITION ASSESSMENT  DOCUMENTATION CODES Per approved criteria  -Underweight   INTERVENTION:  Resource Breeze supplement 3 times daily between meals (250 kcals, 9 gm protein per 8 fl oz carton) RD to follow for nutrition care plan  NUTRITION DIAGNOSIS: Inadequate oral intake related to diverticulitis as evidenced by clear liquid diet  Goal: Oral intake with meals & supplements to meet >/= 90% of estimated nutrition needs  Monitor:  PO & supplemental intake, weight, labs, I/O's  Reason for Assessment: Malnutrition Screening Tool Report  78 y.o. male  Admitting Dx: Diverticulitis  ASSESSMENT: Patient admitted with abdominal pain and vomiting; CT scan showed no evidence of perforation or abscess formation; patient sleeping upon RD entry; RD briefly spoke with daughter, however, unable to provide much nutrition hx on the as she lives in [place], [place]; states patient was not eating well 2 days PTA; believes his weight has been stable; would benefit from supplements -- RD to order.  Height: Ht Readings from Last 1 Encounters:  11/16/12 6\' 9"  (2.057 m)    Weight: Wt Readings from Last 1 Encounters:  11/16/12 145 lb 4.5 oz (65.9 kg)    Ideal Body Weight: 105.4 kg  % Ideal Body Weight: 63%  Wt Readings from Last 10 Encounters:  11/16/12 145 lb 4.5 oz (65.9 kg)    Usual Body Weight: ~ 150 lb  % Usual Body Weight: 97%  BMI:  Body mass index is 15.57 kg/(m^2).  Estimated Nutritional Needs: Kcal: 2000-2200 Protein: 100-110 gm Fluid: 2.0-2.2 L  Skin: Intact  Diet Order: Clear Liquid  EDUCATION NEEDS: -No education needs identified at this time   Intake/Output Summary (Last 24 hours) at 11/16/12 1509 Last data filed at 11/16/12 1300  Gross per 24 hour  Intake    560 ml  Output      3 ml  Net    557 ml    Last BM: 12/28  Labs:   Lab 11/16/12 0500 11/15/12 2100  NA 130* 131*  K 3.2* 3.0*  CL 94* 93*  CO2 15* 14*  BUN 59* 56*  CREATININE  3.89* 4.40*  CALCIUM 7.6* 8.2*  MG 1.0* --  PHOS -- --  GLUCOSE 92 125*    Scheduled Meds:   . ciprofloxacin  400 mg Intravenous BID  . heparin  5,000 Units Subcutaneous Q8H  . metronidazole  500 mg Intravenous Q8H  . sodium bicarbonate  650 mg Oral QID  . sodium chloride  1,000 mL Intravenous Once  . sodium chloride  500 mL Intravenous Once    Continuous Infusions:   History reviewed. No pertinent past medical history.  History reviewed. No pertinent past surgical history.  Kirkland Hun, RD, LDN Pager #: [phone] After-Hours Pager #: [phone]

## 2012-11-16 NOTE — H&P (Addendum)
[facility]                                                                                    Patient Demographics  [name] [name], is a 78 y.o. male  CSN: [id number]  MRN: [id number]  DOB - January 10, 1930  Admit Date - 11/15/2012  Outpatient Primary MD for the patient is No primary provider on file.   With History of -  History reviewed. No pertinent past medical history.    History reviewed. No pertinent past surgical history.  in for   Chief Complaint  Patient presents with  . Emesis     HPI  [name] [name]  is a 78 y.o. male, who is in excellent health and has no past medical problems all home medications, had last been lab work at primary care physician's office one year ago and it was normal, he apparently went out with family members on Tuesday to have breakfast at a [employer], following which he started developing abdominal pain and diarrhea with some emesis since yesterday, his abdominal pain is in the lower quadrants, it is intermittent and crampy in nature, associated with nausea vomiting diarrhea, nonradiating pain with no aggravating or relieving factors, he has been feeling thirsty and making less urine today, he [facility] as his symptoms are not getting better where he was diagnosed with diverticulitis on CT scan along with dehydration and acute renal failure.   Patient denies any fever chills, denies any exposure to sick contacts or antibiotics recently. Denies any previous history of renal problems.    Review of Systems    In addition to the HPI above, No Fever-chills, No Headache, No changes with Vision or hearing, No problems swallowing food or Liquids, No Chest pain, Cough or Shortness of Breath, No Blood in stool or Urine, No dysuria, No new skin rashes or bruises, No new joints pains-aches,  No new weakness, tingling, numbness in any extremity, No recent weight gain or loss, No polyuria, polydypsia or  polyphagia, No significant Mental Stressors.  A full 10 point Review of Systems was done, except as stated above, all other Review of Systems were negative.   Social History History  Substance Use Topics  . Smoking status: Not on file  . Smokeless tobacco: Not on file  . Alcohol Use: Not on file     Family History No history of colon cancer or renal failure  Prior to Admission medications   Not on File    No Known Allergies  Physical Exam  Vitals  Blood pressure 89/56, pulse 92, temperature 98.9 F (37.2 C), temperature source Rectal, resp. rate 14, weight 63.957 kg (141 lb), SpO2 99.00%.   1. General elderly white male lying in bed in NAD,    2. Normal affect and insight, Not Suicidal or Homicidal, Awake Alert, Oriented X 3.  3. No F.N deficits, ALL C.Nerves Intact, Strength 5/5 all 4 extremities, Sensation intact all 4 extremities, Plantars down going.  4. Ears and Eyes appear Normal, Conjunctivae clear, PERRLA. Moist Oral Mucosa.  5. Supple Neck, No JVD, No cervical lymphadenopathy appriciated, No Carotid Bruits.  6. Symmetrical Chest  wall movement, Good air movement bilaterally, CTAB.  7. RRR, No Gallops, Rubs or Murmurs, No Parasternal Heave.  8. Positive Bowel Sounds, Abdomen Soft, some lower quadrant tenderness, No organomegaly appriciated,No rebound -guarding or rigidity.  9.  No Cyanosis, Normal Skin Turgor, No Skin Rash or Bruise.  10. Good muscle tone,  joints appear normal , no effusions, Normal ROM.  11. No Palpable Lymph Nodes in Neck or Axillae    Data Review  CBC  Lab 11/15/12 2100  WBC 6.5  HGB 18.3*  HCT 52.0  PLT 220  MCV 82.7  MCH 29.1  MCHC 35.2  RDW 14.9  LYMPHSABS 0.5*  MONOABS 0.8  EOSABS 0.0  BASOSABS 0.0  BANDABS --   ------------------------------------------------------------------------------------------------------------------  Chemistries   Lab 11/15/12 2100  NA 131*  K 3.0*  CL 93*  CO2 14*  GLUCOSE  125*  BUN 56*  CREATININE 4.40*  CALCIUM 8.2*  MG --  AST 225*  ALT 51  ALKPHOS 138*  BILITOT 0.7   ------------------------------------------------------------------------------------------------------------------ CrCl is unknown because there is no height on file for the current visit. ------------------------------------------------------------------------------------------------------------------ No results found for this basename: TSH,T4TOTAL,FREET3,T3FREE,THYROIDAB in the last 72 hours   Coagulation profile No results found for this basename: INR:5,PROTIME:5 in the last 168 hours ------------------------------------------------------------------------------------------------------------------- No results found for this basename: DDIMER:2 in the last 72 hours -------------------------------------------------------------------------------------------------------------------  Cardiac Enzymes No results found for this basename: CK:3,CKMB:3,TROPONINI:3,MYOGLOBIN:3 in the last 168 hours ------------------------------------------------------------------------------------------------------------------ No components found with this basename: POCBNP:3   ---------------------------------------------------------------------------------------------------------------  Urinalysis    Component Value Date/Time   COLORURINE AMBER* 11/15/2012 2244   APPEARANCEUR TURBID* 11/15/2012 2244   LABSPEC 1.020 11/15/2012 2244   PHURINE 5.0 11/15/2012 2244   GLUCOSEU NEGATIVE 11/15/2012 2244   HGBUR LARGE* 11/15/2012 2244   BILIRUBINUR MODERATE* 11/15/2012 2244   KETONESUR 15* 11/15/2012 2244   PROTEINUR 100* 11/15/2012 2244   UROBILINOGEN 1.0 11/15/2012 2244   NITRITE NEGATIVE 11/15/2012 2244   LEUKOCYTESUR SMALL* 11/15/2012 2244    ----------------------------------------------------------------------------------------------------------------  Imaging results:   Ct Abdomen Pelvis Wo  Contrast  11/15/2012  *RADIOLOGY REPORT*  Clinical Data: Vomiting for 3 days.  CT ABDOMEN AND PELVIS WITHOUT CONTRAST  Technique:  Multidetector CT imaging of the abdomen and pelvis was performed following the standard protocol without intravenous contrast.  Comparison: Report from CT of the abdomen and pelvis performed 04/08/2010  Findings: Minimal bibasilar atelectasis is noted; scattered emphysematous change is seen at the lung bases.  Mild scattered coronary artery calcifications are noted.  Postoperative change is noted along the gastroesophageal junction.  The liver and spleen are unremarkable in appearance. The patient is status post cholecystectomy, with clips noted at the gallbladder fossa.  The slightly decreased attenuation at the pancreatic head is thought to reflect beam hardening artifact from adjacent contrast.  The pancreas and adrenal glands are grossly unremarkable in appearance.  Scattered small left renal stones are seen, measuring up to 6 mm in size.  Mild left-sided renal pelvicaliectasis remains within normal limits, and likely reflects the inflammatory process of the sigmoid colon impressing against the distal left ureter. The right kidney is grossly unremarkable in appearance.  Minimal nonspecific right- sided perinephric stranding is seen.  The small bowel is unremarkable in appearance.  The stomach is within normal limits.  No acute vascular abnormalities are seen. Numerous scattered mildly prominent mesenteric nodes are seen, nonspecific in appearance.  Relatively diffuse calcification is noted along the abdominal aorta and its branches, with mild ectasia of the distal abdominal  aorta.  The appendix is normal in caliber, without evidence for appendicitis.  There is focal soft tissue inflammation noted along the mid sigmoid colon, with associated inflamed diverticula, compatible with mild acute diverticulitis.  Additional diverticulosis is seen scattered about the sigmoid colon.  The  colon is largely filled with fluid and is otherwise unremarkable in appearance.  There is no evidence of perforation or abscess formation.  No significant free fluid is seen.  The bladder is mildly distended and grossly unremarkable.  The prostate is borderline enlarged, measuring 4.9 cm in transverse dimension.  No inguinal lymphadenopathy is seen.  No acute osseous abnormalities are identified.  Vacuum phenomenon and disc space narrowing are noted at L4-L5, with mild associated endplate sclerotic change.  An associated disc protrusion is seen, without evidence of impression on exiting nerve roots.  IMPRESSION:  1.  Mild acute diverticulitis noted along the mid sigmoid colon, with associated focal soft tissue inflammation.  No evidence of perforation or abscess formation; no free fluid seen. 2.  Diverticulosis scattered about the sigmoid colon. 3.  Relatively diffuse calcification along the abdominal aorta and its branches. 4.  Scattered small left renal stones seen, measuring up to 6 mm in size.  Mild left-sided pelvicaliectasis appears to reflect the inflammatory process at the sigmoid colon impressing against the distal left ureter; no evidence of hydronephrosis. 5.  Mild scattered coronary artery calcifications seen. 6.  Scattered emphysematous change at the lung bases. 7.  Borderline enlarged prostate.   Original Report Authenticated By: Tonia Ghent, M.D.       Assessment & Plan   1. Acute diverticulitis causing diarrhea nausea vomiting, dehydration , hypotension with prerenal azotemia and acute renal failure. Plan is to do the patient on MedSurg bed, obtain stool cultures along with C. difficile, for now empiric IV Cipro Flagyl, aggressive IV fluids, repeat CBC BMP in the morning.    2. Acute renal failure due to #1 above. No past history of renal problems, patient on no medications, likely due to dehydration from #1 above, will obtain UA, urine sodium and creatinine, IV fluids, repeat BMP in  the morning, check renal ultrasound.    3. Hypokalemia. Potassium will be replaced and rechecked.    4. Metabolic acidosis due to #2, will replace bicarbonate by mouth, monitor BMP closely, if no rapid improvement changed to IV bicarbonate replacement. We'll try to give normal saline instead of D5 bicarbonate IV as his blood pressure is low.    5. Monitor the above highlighted CT findings around the ureter.     DVT Prophylaxis Heparin    AM Labs Ordered, also please review Full Orders  Family Communication: Admission, patients condition and plan of care including tests being ordered have been discussed with the patient and son who indicate understanding and agree with the plan and Code Status.  Code Status DNR  Disposition Plan: home  Time spent in minutes : 35  Condition [name] [name] K M.D on 11/16/2012 at 2:40 AM  Between 7am to 7pm - Pager - [phone]  After 7pm go to [email or web address] - password [email or web address]  And look for the night coverage person covering me after hours  [facility] Office  [phone]

## 2012-11-17 DIAGNOSIS — A419 Sepsis, unspecified organism: Principal | ICD-10-CM

## 2012-11-17 LAB — BASIC METABOLIC PANEL
BUN: 54 mg/dL — ABNORMAL HIGH (ref 6–23)
CO2: 17 mEq/L — ABNORMAL LOW (ref 19–32)
Calcium: 8 mg/dL — ABNORMAL LOW (ref 8.4–10.5)
Chloride: 98 mEq/L (ref 96–112)
Creatinine, Ser: 3.08 mg/dL — ABNORMAL HIGH (ref 0.50–1.35)
GFR calc Af Amer: 20 mL/min — ABNORMAL LOW (ref >90)
GFR calc non Af Amer: 18 mL/min — ABNORMAL LOW (ref >90)
Glucose, Bld: 82 mg/dL (ref 70–99)
Potassium: 2.6 mEq/L — CL (ref 3.5–5.1)
Sodium: 132 mEq/L — ABNORMAL LOW (ref 135–145)

## 2012-11-17 LAB — CBC
HCT: 43.2 % (ref 39.0–52.0)
Hemoglobin: 15.5 g/dL (ref 13.0–17.0)
MCH: 30.1 pg (ref 26.0–34.0)
MCHC: 35.9 g/dL (ref 30.0–36.0)
MCV: 83.9 fL (ref 78.0–100.0)
Platelets: 200 10*3/uL (ref 150–400)
RBC: 5.15 MIL/uL (ref 4.22–5.81)
RDW: 14.1 % (ref 11.5–15.5)
WBC: 16.1 10*3/uL — ABNORMAL HIGH (ref 4.0–10.5)

## 2012-11-17 LAB — INFLUENZA PANEL BY PCR (TYPE A & B)
H1N1 flu by pcr: NOT DETECTED
Influenza A By PCR: NEGATIVE
Influenza B By PCR: NEGATIVE

## 2012-11-17 LAB — MAGNESIUM: Magnesium: 1.2 mg/dL — ABNORMAL LOW (ref 1.5–2.5)

## 2012-11-17 MED ORDER — POTASSIUM CHLORIDE 20 MEQ/15ML (10%) PO LIQD
40.0000 meq | Freq: Once | ORAL | Status: AC
  Administered 2012-11-17: 40 meq via ORAL
  Filled 2012-11-17: qty 30

## 2012-11-17 MED ORDER — PIPERACILLIN-TAZOBACTAM IN DEX 2-0.25 GM/50ML IV SOLN
2.2500 g | Freq: Four times a day (QID) | INTRAVENOUS | Status: DC
  Administered 2012-11-17 – 2012-11-18 (×5): 2.25 g via INTRAVENOUS
  Filled 2012-11-17 (×8): qty 50

## 2012-11-17 MED ORDER — CHOLESTYRAMINE 4 G PO PACK
4.0000 g | PACK | Freq: Every day | ORAL | Status: DC
  Administered 2012-11-17 – 2012-11-20 (×4): 4 g via ORAL
  Filled 2012-11-17 (×5): qty 1

## 2012-11-17 MED ORDER — MAGNESIUM SULFATE 40 MG/ML IJ SOLN
4.0000 g | Freq: Once | INTRAMUSCULAR | Status: AC
  Administered 2012-11-17: 4 g via INTRAVENOUS
  Filled 2012-11-17: qty 100

## 2012-11-17 MED ORDER — POTASSIUM CHLORIDE 10 MEQ/100ML IV SOLN
10.0000 meq | INTRAVENOUS | Status: AC
  Administered 2012-11-17 (×2): 10 meq via INTRAVENOUS
  Filled 2012-11-17 (×2): qty 100

## 2012-11-17 MED ORDER — SODIUM CHLORIDE 0.9 % IV SOLN
INTRAVENOUS | Status: DC
  Administered 2012-11-17 – 2012-11-19 (×2): via INTRAVENOUS
  Filled 2012-11-17 (×6): qty 1000

## 2012-11-17 NOTE — Progress Notes (Signed)
CRITICAL VALUE ALERT  Critical value received: Potassium 2.6  Date of notification:  11/17/12  Time of notification:  12:58  Critical value read back:yes  Nurse who received alert:  Fayne Norrie, RN  MD notified (1st page):  Dr. Algis Downs. Tat  Time of first page:  1300  MD notified (2nd page):  Time of second page:  Responding MD:  Dr. Algis Downs. Tat  Time MD responded:  1306- Orders placed for IV and PO potassium (see MAR for details).

## 2012-11-17 NOTE — Progress Notes (Signed)
[facility] PROGRESS NOTE  [name] [name] ZOX:[id number] DOB: 28-Dec-1929 DOA: 11/15/2012 PCP: No primary provider on file.  Assessment/Plan: Sepsis -present at time of admission -Secondary to acute diverticulitis -WBC increased today -Blood cultures x2 sets -Empirically broaden antibiotic coverage -Start Zosyn;  discontinue Cipro and Flagyl  Acute diverticulitis  -Appears to be clinically improving with decreased abdominal pain and improved vomiting  -Advance diet  -Continue IV antibiotics  Acute kidney injury  -Likely a combination of ATN and hypovolemia -Continue IV hydration  -Renal ultrasound negative for hydronephrosis  Hypokalemia  -Replete  -Partly due to diarrhea and hypomagnesemia  -Replete magnesium  Diarrhea  -Questran when necessary -Clostridium difficile toxin negative x2    Family Communication:   Daughter at beside Disposition Plan:   Home when medically stable      Antibiotics:  Cipro December 27 >> December 29    Flagyl December 27 >>> December 29    Zosyn December 29 >>>    Procedures/Studies: Ct Abdomen Pelvis Wo Contrast  11/15/2012  *RADIOLOGY REPORT*  Clinical Data: Vomiting for 3 days.  CT ABDOMEN AND PELVIS WITHOUT CONTRAST  Technique:  Multidetector CT imaging of the abdomen and pelvis was performed following the standard protocol without intravenous contrast.  Comparison: Report from CT of the abdomen and pelvis performed 04/08/2010  Findings: Minimal bibasilar atelectasis is noted; scattered emphysematous change is seen at the lung bases.  Mild scattered coronary artery calcifications are noted.  Postoperative change is noted along the gastroesophageal junction.  The liver and spleen are unremarkable in appearance. The patient is status post cholecystectomy, with clips noted at the gallbladder fossa.  The slightly decreased attenuation at the pancreatic head is thought to reflect beam hardening artifact from adjacent contrast.  The  pancreas and adrenal glands are grossly unremarkable in appearance.  Scattered small left renal stones are seen, measuring up to 6 mm in size.  Mild left-sided renal pelvicaliectasis remains within normal limits, and likely reflects the inflammatory process of the sigmoid colon impressing against the distal left ureter. The right kidney is grossly unremarkable in appearance.  Minimal nonspecific right- sided perinephric stranding is seen.  The small bowel is unremarkable in appearance.  The stomach is within normal limits.  No acute vascular abnormalities are seen. Numerous scattered mildly prominent mesenteric nodes are seen, nonspecific in appearance.  Relatively diffuse calcification is noted along the abdominal aorta and its branches, with mild ectasia of the distal abdominal aorta.  The appendix is normal in caliber, without evidence for appendicitis.  There is focal soft tissue inflammation noted along the mid sigmoid colon, with associated inflamed diverticula, compatible with mild acute diverticulitis.  Additional diverticulosis is seen scattered about the sigmoid colon.  The colon is largely filled with fluid and is otherwise unremarkable in appearance.  There is no evidence of perforation or abscess formation.  No significant free fluid is seen.  The bladder is mildly distended and grossly unremarkable.  The prostate is borderline enlarged, measuring 4.9 cm in transverse dimension.  No inguinal lymphadenopathy is seen.  No acute osseous abnormalities are identified.  Vacuum phenomenon and disc space narrowing are noted at L4-L5, with mild associated endplate sclerotic change.  An associated disc protrusion is seen, without evidence of impression on exiting nerve roots.  IMPRESSION:  1.  Mild acute diverticulitis noted along the mid sigmoid colon, with associated focal soft tissue inflammation.  No evidence of perforation or abscess formation; no free fluid seen. 2.  Diverticulosis scattered about the  sigmoid colon. 3.  Relatively diffuse calcification along the abdominal aorta and its branches. 4.  Scattered small left renal stones seen, measuring up to 6 mm in size.  Mild left-sided pelvicaliectasis appears to reflect the inflammatory process at the sigmoid colon impressing against the distal left ureter; no evidence of hydronephrosis. 5.  Mild scattered coronary artery calcifications seen. 6.  Scattered emphysematous change at the lung bases. 7.  Borderline enlarged prostate.   Original Report Authenticated By: Tonia Ghent, M.D.    US Renal  11/16/2012  *RADIOLOGY REPORT*  Clinical Data: Acute renal failure.  RENAL/URINARY TRACT ULTRASOUND COMPLETE  Comparison:  CT 11/15/2012  Findings:  Right Kidney:  10.9 cm.  No focal abnormality.  No hydronephrosis. Normal echotexture.  Left Kidney:  10.0 cm.  Mild cortical thinning.  Echogenic shadowing foci within the left kidney compatible with nonobstructing stones as seen on prior CT.  No hydronephrosis. Normal echotexture.  Bladder:   Decompressed.  Not well visualized.  IMPRESSION: Mild cortical thinning within the left kidney.  Left nephrolithiasis.  No acute findings.   Original Report Authenticated By: Charlett Nose, M.D.          Subjective:  patient is feeling slightly better today. Abdominal pain is improving. No more vomiting. No more nausea. He wants to eat more substantial food. Denies fevers, chills, chest pain, shortness of breath, dysuria, hematuria. No rashes.   Objective: Filed Vitals:   11/16/12 1338 11/16/12 1800 11/16/12 2200 11/17/12 0600  BP: 90/57 99/64 87/57  87/57  Pulse: 83 81 73 73  Temp: 97.5 F (36.4 C) 98.2 F (36.8 C) 98 F (36.7 C) 97.6 F (36.4 C)  TempSrc: Oral Axillary Oral Oral  Resp: 18 18 18 18   Height:      Weight:   67.6 kg (149 lb 0.5 oz)   SpO2: 97% 96% 92% 93%    Intake/Output Summary (Last 24 hours) at 11/17/12 0917 Last data filed at 11/17/12 0700  Gross per 24 hour  Intake   1260 ml    Output    607 ml  Net    653 ml   Weight change: -40.265 kg (-88 lb 12.3 oz) Exam:   General:  Pt is alert, follows commands appropriately, not in acute distress  HEENT: No icterus, No thrush,  [place]/AT  Cardiovascular: RRR, S1/S2, no rubs, no gallops  Respiratory: CTA bilaterally, no wheezing, no crackles, no rhonchi  Abdomen: Soft/+BS,, non distended, no guarding; mild left lower quadrant tenderness without any guarding or peritoneal signs.   Extremities: No edema, No lymphangitis, No petechiae, No rashes, no synovitis  Data Reviewed: Basic Metabolic Panel:  Lab 11/16/12 1610 11/15/12 2100  NA 130* 131*  K 3.2* 3.0*  CL 94* 93*  CO2 15* 14*  GLUCOSE 92 125*  BUN 59* 56*  CREATININE 3.89* 4.40*  CALCIUM 7.6* 8.2*  MG 1.0* --  PHOS -- --   Liver Function Tests:  Lab 11/15/12 2100  AST 225*  ALT 51  ALKPHOS 138*  BILITOT 0.7  PROT 8.3  ALBUMIN 3.7    Lab 11/15/12 2100  LIPASE 12  AMYLASE --   No results found for this basename: AMMONIA:5 in the last 168 hours CBC:  Lab 11/17/12 0739 11/16/12 0500 11/15/12 2100  WBC 16.1* 13.9* 6.5  NEUTROABS -- -- 5.2  HGB 15.5 15.1 18.3*  HCT 43.2 43.8 52.0  MCV 83.9 84.7 82.7  PLT 200 211 220   Cardiac Enzymes: No results found for this basename: CKTOTAL:5,CKMB:5,CKMBINDEX:5,TROPONINI:5  in the last 168 hours BNP: No components found with this basename: POCBNP:5 CBG: No results found for this basename: GLUCAP:5 in the last 168 hours  Recent Results (from the past 240 hour(s))  CLOSTRIDIUM DIFFICILE BY PCR     Status: Normal   Collection Time   11/15/12  9:15 PM      Component Value Range Status Comment   C difficile by pcr NEGATIVE  NEGATIVE Final   CLOSTRIDIUM DIFFICILE BY PCR     Status: Normal   Collection Time   11/16/12  3:20 AM      Component Value Range Status Comment   C difficile by pcr NEGATIVE  NEGATIVE Final      Scheduled Meds:   . feeding supplement  1 Container Oral TID BM  . heparin   5,000 Units Subcutaneous Q8H  . piperacillin-tazobactam (ZOSYN)  IV  2.25 g Intravenous Q6H  . sodium bicarbonate  650 mg Oral QID  . sodium chloride  1,000 mL Intravenous Once  . sodium chloride  500 mL Intravenous Once   Continuous Infusions:   . sodium chloride 0.9 % 1,000 mL with potassium chloride 20 mEq infusion       Fumiye Lubben, DO  [facility] Pager [phone]  If 7PM-7AM, please contact night-coverage [email or web address] Password [email or web address] 11/17/2012, 9:17 AM   LOS: 2 days

## 2012-11-17 NOTE — Progress Notes (Signed)
Late entry: Upon initiation of IV potassium at 13:45, at a rate of 121ml/hr, patient c/o severe pain in Left forearm at site of IV. IV fluid stopped until patient states pain no longer remains. Restarted IV potassium at 65ml/hr. Pt immediately states severe pain at site of IV in left ac. Discontinued left ac IV. Attempted two peripheral IV starts, had charge nurse make 3rd attempt; all unsuccessful. IV team paged for new IV insertion. Prior to new IV establishment pt given PO potassium dose. Dr. Arbutus Leas made aware of pt's inability to tolerate IV potassium and discontinuation of IV. New IV established by IV team in right forearm. IV potassium restarted at 50 ml/hr. Pt tolerated for 30 minutes at that rate and then began to c/o severe pain at IV site in rite forearm. Discontinued potassium and began run of Normal Saline with Magnesium piggy backed. See MAR for details. Patient tolerated well. Will continue to monitor patient closely and relay information to night shift nurse.

## 2012-11-18 LAB — CBC WITH DIFFERENTIAL/PLATELET
Basophils Absolute: 0 10*3/uL (ref 0.0–0.1)
Basophils Relative: 0 % (ref 0–1)
Eosinophils Absolute: 0.2 10*3/uL (ref 0.0–0.7)
Eosinophils Relative: 2 % (ref 0–5)
HCT: 41.5 % (ref 39.0–52.0)
Hemoglobin: 14.4 g/dL (ref 13.0–17.0)
Lymphocytes Relative: 11 % — ABNORMAL LOW (ref 12–46)
Lymphs Abs: 1.1 10*3/uL (ref 0.7–4.0)
MCH: 28.9 pg (ref 26.0–34.0)
MCHC: 34.7 g/dL (ref 30.0–36.0)
MCV: 83.2 fL (ref 78.0–100.0)
Monocytes Absolute: 1.3 10*3/uL — ABNORMAL HIGH (ref 0.1–1.0)
Monocytes Relative: 12 % (ref 3–12)
Neutro Abs: 8.2 10*3/uL — ABNORMAL HIGH (ref 1.7–7.7)
Neutrophils Relative %: 75 % (ref 43–77)
Platelets: 207 10*3/uL (ref 150–400)
RBC: 4.99 MIL/uL (ref 4.22–5.81)
RDW: 14.4 % (ref 11.5–15.5)
WBC: 11 10*3/uL — ABNORMAL HIGH (ref 4.0–10.5)

## 2012-11-18 LAB — BASIC METABOLIC PANEL
BUN: 40 mg/dL — ABNORMAL HIGH (ref 6–23)
CO2: 16 mEq/L — ABNORMAL LOW (ref 19–32)
Calcium: 8.1 mg/dL — ABNORMAL LOW (ref 8.4–10.5)
Chloride: 103 mEq/L (ref 96–112)
Creatinine, Ser: 2.32 mg/dL — ABNORMAL HIGH (ref 0.50–1.35)
GFR calc Af Amer: 29 mL/min — ABNORMAL LOW (ref >90)
GFR calc non Af Amer: 25 mL/min — ABNORMAL LOW (ref >90)
Glucose, Bld: 81 mg/dL (ref 70–99)
Potassium: 3.2 mEq/L — ABNORMAL LOW (ref 3.5–5.1)
Sodium: 135 mEq/L (ref 135–145)

## 2012-11-18 LAB — MAGNESIUM: Magnesium: 2.5 mg/dL (ref 1.5–2.5)

## 2012-11-18 MED ORDER — POTASSIUM CHLORIDE 20 MEQ/15ML (10%) PO LIQD
40.0000 meq | Freq: Once | ORAL | Status: AC
  Administered 2012-11-18: 40 meq via ORAL
  Filled 2012-11-18: qty 30

## 2012-11-18 MED ORDER — PIPERACILLIN-TAZOBACTAM 3.375 G IVPB
3.3750 g | Freq: Three times a day (TID) | INTRAVENOUS | Status: DC
  Administered 2012-11-18 – 2012-11-19 (×3): 3.375 g via INTRAVENOUS
  Filled 2012-11-18 (×5): qty 50

## 2012-11-18 NOTE — Progress Notes (Signed)
[facility] PROGRESS NOTE  [name] [name] NWG:[id number] DOB: [birth year] DOA: 11/15/2012 PCP: No primary provider on file.  Assessment/Plan: Sepsis  -present at time of admission  -Secondary to acute diverticulitis  -WBC improving today  -Blood cultures x2 sets  -Empirically broaden antibiotic coverage  -Start Zosyn; discontinue Cipro and Flagyl  Acute diverticulitis  -Appears to be clinically improving with decreased abdominal pain and improved vomiting  -Advance diet to cardiac -Continue IV antibiotics  Acute kidney injury  -Likely a combination of ATN and hypovolemia  -Continue IV hydration  -Renal ultrasound negative for hydronephrosis  Hypokalemia  -Replete  -Partly due to diarrhea and hypomagnesemia  -Repleted magnesium  Diarrhea  -Questran when necessary  -Clostridium difficile toxin negative x2  Family Communication: wife at beside  Disposition Plan: Home when medically stable  Antibiotics:  Cipro December 27 >> December 29  Flagyl December 27 >>> December 29  Zosyn December 29 >>>      Family Communication:   Pt at beside Disposition Plan:   Home when medically stable     Procedures/Studies: Ct Abdomen Pelvis Wo Contrast  11/15/2012  *RADIOLOGY REPORT*  Clinical Data: Vomiting for 3 days.  CT ABDOMEN AND PELVIS WITHOUT CONTRAST  Technique:  Multidetector CT imaging of the abdomen and pelvis was performed following the standard protocol without intravenous contrast.  Comparison: Report from CT of the abdomen and pelvis performed 04/08/2010  Findings: Minimal bibasilar atelectasis is noted; scattered emphysematous change is seen at the lung bases.  Mild scattered coronary artery calcifications are noted.  Postoperative change is noted along the gastroesophageal junction.  The liver and spleen are unremarkable in appearance. The patient is status post cholecystectomy, with clips noted at the gallbladder fossa.  The slightly decreased attenuation at the  pancreatic head is thought to reflect beam hardening artifact from adjacent contrast.  The pancreas and adrenal glands are grossly unremarkable in appearance.  Scattered small left renal stones are seen, measuring up to 6 mm in size.  Mild left-sided renal pelvicaliectasis remains within normal limits, and likely reflects the inflammatory process of the sigmoid colon impressing against the distal left ureter. The right kidney is grossly unremarkable in appearance.  Minimal nonspecific right- sided perinephric stranding is seen.  The small bowel is unremarkable in appearance.  The stomach is within normal limits.  No acute vascular abnormalities are seen. Numerous scattered mildly prominent mesenteric nodes are seen, nonspecific in appearance.  Relatively diffuse calcification is noted along the abdominal aorta and its branches, with mild ectasia of the distal abdominal aorta.  The appendix is normal in caliber, without evidence for appendicitis.  There is focal soft tissue inflammation noted along the mid sigmoid colon, with associated inflamed diverticula, compatible with mild acute diverticulitis.  Additional diverticulosis is seen scattered about the sigmoid colon.  The colon is largely filled with fluid and is otherwise unremarkable in appearance.  There is no evidence of perforation or abscess formation.  No significant free fluid is seen.  The bladder is mildly distended and grossly unremarkable.  The prostate is borderline enlarged, measuring 4.9 cm in transverse dimension.  No inguinal lymphadenopathy is seen.  No acute osseous abnormalities are identified.  Vacuum phenomenon and disc space narrowing are noted at L4-L5, with mild associated endplate sclerotic change.  An associated disc protrusion is seen, without evidence of impression on exiting nerve roots.  IMPRESSION:  1.  Mild acute diverticulitis noted along the mid sigmoid colon, with associated focal soft tissue inflammation.  No evidence  of  perforation or abscess formation; no free fluid seen. 2.  Diverticulosis scattered about the sigmoid colon. 3.  Relatively diffuse calcification along the abdominal aorta and its branches. 4.  Scattered small left renal stones seen, measuring up to 6 mm in size.  Mild left-sided pelvicaliectasis appears to reflect the inflammatory process at the sigmoid colon impressing against the distal left ureter; no evidence of hydronephrosis. 5.  Mild scattered coronary artery calcifications seen. 6.  Scattered emphysematous change at the lung bases. 7.  Borderline enlarged prostate.   Original Report Authenticated By: Tonia Ghent, M.D.    US Renal  11/16/2012  *RADIOLOGY REPORT*  Clinical Data: Acute renal failure.  RENAL/URINARY TRACT ULTRASOUND COMPLETE  Comparison:  CT 11/15/2012  Findings:  Right Kidney:  10.9 cm.  No focal abnormality.  No hydronephrosis. Normal echotexture.  Left Kidney:  10.0 cm.  Mild cortical thinning.  Echogenic shadowing foci within the left kidney compatible with nonobstructing stones as seen on prior CT.  No hydronephrosis. Normal echotexture.  Bladder:   Decompressed.  Not well visualized.  IMPRESSION: Mild cortical thinning within the left kidney.  Left nephrolithiasis.  No acute findings.   Original Report Authenticated By: Charlett Nose, M.D.          Subjective: Patient is tolerating his diet. He continues to feel better. No more nausea or vomiting. Movements of slow down. Denies any fevers, chills, chest pain, shortness breath, abdominal pain, tissue, hematuria, rashes, headache, dizziness.  Objective: Filed Vitals:   11/17/12 1700 11/17/12 2207 11/18/12 0603 11/18/12 1017  BP: 117/69 118/66 122/66 129/71  Pulse: 72 74 75 76  Temp: 98.5 F (36.9 C) 97.6 F (36.4 C) 97.4 F (36.3 C) 98 F (36.7 C)  TempSrc:  Oral Oral Oral  Resp: 18 18 18 18   Height:      Weight:  66.7 kg (147 lb 0.8 oz)    SpO2: 90% 96% 99% 97%    Intake/Output Summary (Last 24 hours) at  11/18/12 1343 Last data filed at 11/18/12 1301  Gross per 24 hour  Intake   1410 ml  Output   1100 ml  Net    310 ml   Weight change: -0.9 kg (-1 lb 15.8 oz) Exam:   General:  Pt is alert, follows commands appropriately, not in acute distress  HEENT: No icterus, No thrush,  [place]/AT  Cardiovascular: RRR, S1/S2, no rubs, no gallops  Respiratory: CTA bilaterally, no wheezing, no crackles, no rhonchi  Abdomen: Soft/+BS, non tender, non distended, no guarding  Extremities: No edema, No lymphangitis, No petechiae, No rashes, no synovitis  Data Reviewed: Basic Metabolic Panel:  Lab 11/18/12 4540 11/17/12 1056 11/16/12 0500 11/15/12 2100  NA 135 132* 130* 131*  K 3.2* 2.6* 3.2* 3.0*  CL 103 98 94* 93*  CO2 16* 17* 15* 14*  GLUCOSE 81 82 92 125*  BUN 40* 54* 59* 56*  CREATININE 2.32* 3.08* 3.89* 4.40*  CALCIUM 8.1* 8.0* 7.6* 8.2*  MG 2.5 1.2* 1.0* --  PHOS -- -- -- --   Liver Function Tests:  Lab 11/15/12 2100  AST 225*  ALT 51  ALKPHOS 138*  BILITOT 0.7  PROT 8.3  ALBUMIN 3.7    Lab 11/15/12 2100  LIPASE 12  AMYLASE --   No results found for this basename: AMMONIA:5 in the last 168 hours CBC:  Lab 11/18/12 0720 11/17/12 0739 11/16/12 0500 11/15/12 2100  WBC 11.0* 16.1* 13.9* 6.5  NEUTROABS 8.2* -- -- 5.2  HGB  14.4 15.5 15.1 18.3*  HCT 41.5 43.2 43.8 52.0  MCV 83.2 83.9 84.7 82.7  PLT 207 200 211 220   Cardiac Enzymes: No results found for this basename: CKTOTAL:5,CKMB:5,CKMBINDEX:5,TROPONINI:5 in the last 168 hours BNP: No components found with this basename: POCBNP:5 CBG: No results found for this basename: GLUCAP:5 in the last 168 hours  Recent Results (from the past 240 hour(s))  CLOSTRIDIUM DIFFICILE BY PCR     Status: Normal   Collection Time   11/15/12  9:15 PM      Component Value Range Status Comment   C difficile by pcr NEGATIVE  NEGATIVE Final   STOOL CULTURE     Status: Normal (Preliminary result)   Collection Time   11/16/12  3:19 AM       Component Value Range Status Comment   Specimen Description STOOL   Final    Special Requests Normal   Final    Culture NO SUSPICIOUS COLONIES, CONTINUING TO HOLD   Final    Report Status PENDING   Incomplete   CLOSTRIDIUM DIFFICILE BY PCR     Status: Normal   Collection Time   11/16/12  3:20 AM      Component Value Range Status Comment   C difficile by pcr NEGATIVE  NEGATIVE Final   CULTURE, BLOOD (ROUTINE X 2)     Status: Normal (Preliminary result)   Collection Time   11/17/12 11:25 AM      Component Value Range Status Comment   Specimen Description BLOOD RIGHT ARM   Final    Special Requests BOTTLES DRAWN AEROBIC AND ANAEROBIC 10CC   Final    Culture  Setup Time 11/17/2012 18:44   Final    Culture     Final    Value:        BLOOD CULTURE RECEIVED NO GROWTH TO DATE CULTURE WILL BE HELD FOR 5 DAYS BEFORE ISSUING A FINAL NEGATIVE REPORT   Report Status PENDING   Incomplete   CULTURE, BLOOD (ROUTINE X 2)     Status: Normal (Preliminary result)   Collection Time   11/17/12 11:35 AM      Component Value Range Status Comment   Specimen Description BLOOD RIGHT HAND   Final    Special Requests BOTTLES DRAWN AEROBIC ONLY 5CC   Final    Culture  Setup Time 11/17/2012 18:44   Final    Culture     Final    Value:        BLOOD CULTURE RECEIVED NO GROWTH TO DATE CULTURE WILL BE HELD FOR 5 DAYS BEFORE ISSUING A FINAL NEGATIVE REPORT   Report Status PENDING   Incomplete      Scheduled Meds:   . cholestyramine  4 g Oral Daily  . feeding supplement  1 Container Oral TID BM  . heparin  5,000 Units Subcutaneous Q8H  . piperacillin-tazobactam (ZOSYN)  IV  2.25 g Intravenous Q6H  . sodium bicarbonate  650 mg Oral QID  . sodium chloride  1,000 mL Intravenous Once  . sodium chloride  500 mL Intravenous Once   Continuous Infusions:   . sodium chloride 0.9 % 1,000 mL with potassium chloride 20 mEq infusion 75 mL/hr at 11/17/12 1659     Asbury Hair, DO  [facility] Pager  [phone]  If 7PM-7AM, please contact night-coverage [email or web address] Password [email or web address] 11/18/2012, 1:43 PM   LOS: 3 days

## 2012-11-18 NOTE — Progress Notes (Signed)
Utilization review completed.  

## 2012-11-19 LAB — BASIC METABOLIC PANEL
BUN: 32 mg/dL — ABNORMAL HIGH (ref 6–23)
CO2: 17 mEq/L — ABNORMAL LOW (ref 19–32)
Calcium: 7.9 mg/dL — ABNORMAL LOW (ref 8.4–10.5)
Chloride: 107 mEq/L (ref 96–112)
Creatinine, Ser: 2 mg/dL — ABNORMAL HIGH (ref 0.50–1.35)
GFR calc Af Amer: 35 mL/min — ABNORMAL LOW (ref >90)
GFR calc non Af Amer: 30 mL/min — ABNORMAL LOW (ref >90)
Glucose, Bld: 98 mg/dL (ref 70–99)
Potassium: 3.9 mEq/L (ref 3.5–5.1)
Sodium: 135 mEq/L (ref 135–145)

## 2012-11-19 LAB — STOOL CULTURE: Special Requests: NORMAL

## 2012-11-19 LAB — CBC
HCT: 39.3 % (ref 39.0–52.0)
Hemoglobin: 13.7 g/dL (ref 13.0–17.0)
MCH: 29.3 pg (ref 26.0–34.0)
MCHC: 34.9 g/dL (ref 30.0–36.0)
MCV: 84.2 fL (ref 78.0–100.0)
Platelets: 186 10*3/uL (ref 150–400)
RBC: 4.67 MIL/uL (ref 4.22–5.81)
RDW: 14.8 % (ref 11.5–15.5)
WBC: 10.2 10*3/uL (ref 4.0–10.5)

## 2012-11-19 LAB — MAGNESIUM: Magnesium: 2.2 mg/dL (ref 1.5–2.5)

## 2012-11-19 MED ORDER — METRONIDAZOLE 500 MG PO TABS
500.0000 mg | ORAL_TABLET | Freq: Three times a day (TID) | ORAL | Status: DC
  Administered 2012-11-19 – 2012-11-20 (×3): 500 mg via ORAL
  Filled 2012-11-19 (×6): qty 1

## 2012-11-19 MED ORDER — LEVOFLOXACIN 750 MG PO TABS
750.0000 mg | ORAL_TABLET | ORAL | Status: DC
  Administered 2012-11-19: 750 mg via ORAL
  Filled 2012-11-19: qty 1

## 2012-11-19 MED ORDER — POTASSIUM CHLORIDE 2 MEQ/ML IV SOLN
INTRAVENOUS | Status: DC
  Administered 2012-11-19: 20:00:00 via INTRAVENOUS
  Filled 2012-11-19 (×3): qty 1000

## 2012-11-19 NOTE — Plan of Care (Signed)
Problem: Food- and Nutrition-Related Knowledge Deficit (NB-1.1) Goal: Nutrition education Formal process to instruct or train a patient/client in a skill or to impart knowledge to help patients/clients voluntarily manage or modify food choices and eating behavior to maintain or improve health.  Outcome: Completed/Met Date Met:  11/19/12  RD consulted for nutrition education regarding diverticulitis.  RD provided "Low Fiber Diet Education" and "Gradually Increasing Fiber in Diet" handouts from the [employer] of [employer]. Discussed different food groups and need for reduced fiber when symptoms resolve.  Teach back method used.  Expect good compliance.  RD to continue to follow nutrition care plan.  Jarold Motto MS, RD, LDN Pager: [phone] After-hours pager: [phone]

## 2012-11-19 NOTE — Progress Notes (Signed)
[facility] PROGRESS NOTE  Nathanal Hermiz OZH:[id number] DOB: 08-Feb-1930 DOA: 11/15/2012 PCP: No primary provider on file.  Assessment/Plan: Sepsis  -present at time of admission  -Secondary to acute diverticulitis  -WBC improving   -Blood cultures x2 sets  -Empirically broaden antibiotic coverage  -Discontinue Zosyn, start oral Levaquin and Flagyl Acute diverticulitis  -Appears to be clinically improving with decreased abdominal pain and improved vomiting  -Advance diet to cardiac  -Convert to by mouth antibiotics as discussed above Acute kidney injury  -Likely a combination of ATN and hypovolemia  -Continue IV hydration  -Renal ultrasound negative for hydronephrosis  -Serum creatinine continues to improve Hypokalemia  -Replete  -Partly due to diarrhea and hypomagnesemia  -Repleted magnesium  Diarrhea  -Questran when necessary  -Clostridium difficile toxin negative x2  Family Communication: wife at beside  Disposition Plan: Home when medically stable  Antibiotics:  Cipro December 27 >> December 29  Flagyl December 27 >>> December 29  Zosyn December 29 >>> December 31 Levaquin December 31>>> Flagyl December 31>>>      Family Communication:   Wife at beside Disposition Plan:   Home when medically stable    Procedures/Studies: Ct Abdomen Pelvis Wo Contrast  11/15/2012  *RADIOLOGY REPORT*  Clinical Data: Vomiting for 3 days.  CT ABDOMEN AND PELVIS WITHOUT CONTRAST  Technique:  Multidetector CT imaging of the abdomen and pelvis was performed following the standard protocol without intravenous contrast.  Comparison: Report from CT of the abdomen and pelvis performed 04/08/2010  Findings: Minimal bibasilar atelectasis is noted; scattered emphysematous change is seen at the lung bases.  Mild scattered coronary artery calcifications are noted.  Postoperative change is noted along the gastroesophageal junction.  The liver and spleen are unremarkable in appearance. The  patient is status post cholecystectomy, with clips noted at the gallbladder fossa.  The slightly decreased attenuation at the pancreatic head is thought to reflect beam hardening artifact from adjacent contrast.  The pancreas and adrenal glands are grossly unremarkable in appearance.  Scattered small left renal stones are seen, measuring up to 6 mm in size.  Mild left-sided renal pelvicaliectasis remains within normal limits, and likely reflects the inflammatory process of the sigmoid colon impressing against the distal left ureter. The right kidney is grossly unremarkable in appearance.  Minimal nonspecific right- sided perinephric stranding is seen.  The small bowel is unremarkable in appearance.  The stomach is within normal limits.  No acute vascular abnormalities are seen. Numerous scattered mildly prominent mesenteric nodes are seen, nonspecific in appearance.  Relatively diffuse calcification is noted along the abdominal aorta and its branches, with mild ectasia of the distal abdominal aorta.  The appendix is normal in caliber, without evidence for appendicitis.  There is focal soft tissue inflammation noted along the mid sigmoid colon, with associated inflamed diverticula, compatible with mild acute diverticulitis.  Additional diverticulosis is seen scattered about the sigmoid colon.  The colon is largely filled with fluid and is otherwise unremarkable in appearance.  There is no evidence of perforation or abscess formation.  No significant free fluid is seen.  The bladder is mildly distended and grossly unremarkable.  The prostate is borderline enlarged, measuring 4.9 cm in transverse dimension.  No inguinal lymphadenopathy is seen.  No acute osseous abnormalities are identified.  Vacuum phenomenon and disc space narrowing are noted at L4-L5, with mild associated endplate sclerotic change.  An associated disc protrusion is seen, without evidence of impression on exiting nerve roots.  IMPRESSION:  1.  Mild  acute diverticulitis noted along the mid sigmoid colon, with associated focal soft tissue inflammation.  No evidence of perforation or abscess formation; no free fluid seen. 2.  Diverticulosis scattered about the sigmoid colon. 3.  Relatively diffuse calcification along the abdominal aorta and its branches. 4.  Scattered small left renal stones seen, measuring up to 6 mm in size.  Mild left-sided pelvicaliectasis appears to reflect the inflammatory process at the sigmoid colon impressing against the distal left ureter; no evidence of hydronephrosis. 5.  Mild scattered coronary artery calcifications seen. 6.  Scattered emphysematous change at the lung bases. 7.  Borderline enlarged prostate.   Original Report Authenticated By: Tonia Ghent, M.D.    US Renal  11/16/2012  *RADIOLOGY REPORT*  Clinical Data: Acute renal failure.  RENAL/URINARY TRACT ULTRASOUND COMPLETE  Comparison:  CT 11/15/2012  Findings:  Right Kidney:  10.9 cm.  No focal abnormality.  No hydronephrosis. Normal echotexture.  Left Kidney:  10.0 cm.  Mild cortical thinning.  Echogenic shadowing foci within the left kidney compatible with nonobstructing stones as seen on prior CT.  No hydronephrosis. Normal echotexture.  Bladder:   Decompressed.  Not well visualized.  IMPRESSION: Mild cortical thinning within the left kidney.  Left nephrolithiasis.  No acute findings.   Original Report Authenticated By: Charlett Nose, M.D.          Subjective: Patient states that stools firming up, but is still loose. Denies any hematochezia. Denies fevers, chills, chest pain, shortness breath, nausea, vomiting, diarrhea, abdominal pain. He is able to eat a cardiac diet.  Objective: Filed Vitals:   11/19/12 0449 11/19/12 0800 11/19/12 1400 11/19/12 1816  BP: 112/61 106/73 108/80 123/73  Pulse: 68 72 84 84  Temp: 98.1 F (36.7 C) 97.6 F (36.4 C) 97.2 F (36.2 C) 98.7 F (37.1 C)  TempSrc: Oral Oral Oral Oral  Resp: 18 18 18 18   Height:        Weight:      SpO2: 100% 100% 100% 100%    Intake/Output Summary (Last 24 hours) at 11/19/12 1925 Last data filed at 11/19/12 1817  Gross per 24 hour  Intake 2392.5 ml  Output   3176 ml  Net -783.5 ml   Weight change: 0.001 kg (0.1 oz) Exam:   General:  Pt is alert, follows commands appropriately, not in acute distress  HEENT: No icterus, No thrush, No neck mass, [place]/AT  Cardiovascular: RRR, S1/S2, no rubs, no gallops  Respiratory: CTA bilaterally, no wheezing, no crackles, no rhonchi  Abdomen: Soft/+BS, non tender, non distended, no guarding  Extremities: No edema, No lymphangitis, No petechiae, No rashes, no synovitis  Data Reviewed: Basic Metabolic Panel:  Lab 11/19/12 2440 11/18/12 0720 11/17/12 1056 11/16/12 0500 11/15/12 2100  NA 135 135 132* 130* 131*  K 3.9 3.2* 2.6* 3.2* 3.0*  CL 107 103 98 94* 93*  CO2 17* 16* 17* 15* 14*  GLUCOSE 98 81 82 92 125*  BUN 32* 40* 54* 59* 56*  CREATININE 2.00* 2.32* 3.08* 3.89* 4.40*  CALCIUM 7.9* 8.1* 8.0* 7.6* 8.2*  MG 2.2 2.5 1.2* 1.0* --  PHOS -- -- -- -- --   Liver Function Tests:  Lab 11/15/12 2100  AST 225*  ALT 51  ALKPHOS 138*  BILITOT 0.7  PROT 8.3  ALBUMIN 3.7    Lab 11/15/12 2100  LIPASE 12  AMYLASE --   No results found for this basename: AMMONIA:5 in the last 168 hours CBC:  Lab 11/19/12 0600 11/18/12 0720  11/17/12 0739 11/16/12 0500 11/15/12 2100  WBC 10.2 11.0* 16.1* 13.9* 6.5  NEUTROABS -- 8.2* -- -- 5.2  HGB 13.7 14.4 15.5 15.1 18.3*  HCT 39.3 41.5 43.2 43.8 52.0  MCV 84.2 83.2 83.9 84.7 82.7  PLT 186 207 200 211 220   Cardiac Enzymes: No results found for this basename: CKTOTAL:5,CKMB:5,CKMBINDEX:5,TROPONINI:5 in the last 168 hours BNP: No components found with this basename: POCBNP:5 CBG: No results found for this basename: GLUCAP:5 in the last 168 hours  Recent Results (from the past 240 hour(s))  CLOSTRIDIUM DIFFICILE BY PCR     Status: Normal   Collection Time   11/15/12  9:15  PM      Component Value Range Status Comment   C difficile by pcr NEGATIVE  NEGATIVE Final   STOOL CULTURE     Status: Normal   Collection Time   11/16/12  3:19 AM      Component Value Range Status Comment   Specimen Description STOOL   Final    Special Requests Normal   Final    Culture     Final    Value: NO SALMONELLA, SHIGELLA, CAMPYLOBACTER, YERSINIA, OR E.COLI 0157:H7 ISOLATED   Report Status 11/19/2012 FINAL   Final   CLOSTRIDIUM DIFFICILE BY PCR     Status: Normal   Collection Time   11/16/12  3:20 AM      Component Value Range Status Comment   C difficile by pcr NEGATIVE  NEGATIVE Final   CULTURE, BLOOD (ROUTINE X 2)     Status: Normal (Preliminary result)   Collection Time   11/17/12 11:25 AM      Component Value Range Status Comment   Specimen Description BLOOD RIGHT ARM   Final    Special Requests BOTTLES DRAWN AEROBIC AND ANAEROBIC 10CC   Final    Culture  Setup Time 11/17/2012 18:44   Final    Culture     Final    Value:        BLOOD CULTURE RECEIVED NO GROWTH TO DATE CULTURE WILL BE HELD FOR 5 DAYS BEFORE ISSUING A FINAL NEGATIVE REPORT   Report Status PENDING   Incomplete   CULTURE, BLOOD (ROUTINE X 2)     Status: Normal (Preliminary result)   Collection Time   11/17/12 11:35 AM      Component Value Range Status Comment   Specimen Description BLOOD RIGHT HAND   Final    Special Requests BOTTLES DRAWN AEROBIC ONLY 5CC   Final    Culture  Setup Time 11/17/2012 18:44   Final    Culture     Final    Value:        BLOOD CULTURE RECEIVED NO GROWTH TO DATE CULTURE WILL BE HELD FOR 5 DAYS BEFORE ISSUING A FINAL NEGATIVE REPORT   Report Status PENDING   Incomplete      Scheduled Meds:   . cholestyramine  4 g Oral Daily  . feeding supplement  1 Container Oral TID BM  . heparin  5,000 Units Subcutaneous Q8H  . levofloxacin  750 mg Oral Q48H  . metroNIDAZOLE  500 mg Oral Q8H  . sodium bicarbonate  650 mg Oral QID  . sodium chloride  1,000 mL Intravenous Once  .  sodium chloride  500 mL Intravenous Once   Continuous Infusions:   . sodium chloride 0.9 % 1,000 mL with potassium chloride 10 mEq infusion       Topanga Alvelo, DO  [facility] Pager  [phone]  If 7PM-7AM, please contact night-coverage [email or web address] Password [email or web address] 11/19/2012, 7:25 PM   LOS: 4 days

## 2012-11-20 HISTORY — PX: COLON SURGERY: SHX602

## 2012-11-20 LAB — BASIC METABOLIC PANEL
BUN: 22 mg/dL (ref 6–23)
CO2: 19 mEq/L (ref 19–32)
Calcium: 8.1 mg/dL — ABNORMAL LOW (ref 8.4–10.5)
Chloride: 107 mEq/L (ref 96–112)
Creatinine, Ser: 1.58 mg/dL — ABNORMAL HIGH (ref 0.50–1.35)
GFR calc Af Amer: 46 mL/min — ABNORMAL LOW (ref >90)
GFR calc non Af Amer: 40 mL/min — ABNORMAL LOW (ref >90)
Glucose, Bld: 85 mg/dL (ref 70–99)
Potassium: 3.6 mEq/L (ref 3.5–5.1)
Sodium: 136 mEq/L (ref 135–145)

## 2012-11-20 MED ORDER — LEVOFLOXACIN 750 MG PO TABS: 750.0000 mg | ORAL_TABLET | ORAL | Status: DC

## 2012-11-20 MED ORDER — METRONIDAZOLE 500 MG PO TABS: 500.0000 mg | ORAL_TABLET | Freq: Three times a day (TID) | ORAL | Status: DC

## 2012-11-20 NOTE — Progress Notes (Signed)
Patient discharged to home. Patient AVS reviewed. Patient verbalized understanding of medications and follow-up appointments.  Patient remains stable; no signs or symptoms of distress.  Pt educated to return to the ER in cases of exacerbation of admitting diagnosis, SOB, dizziness, fever, chest pain, or fainting.

## 2012-11-20 NOTE — Progress Notes (Signed)
Patient declined both pneumonia and influenza vaccine. Pneumonia and Influenza information sheets given to patient.

## 2012-11-20 NOTE — Discharge Summary (Signed)
Physician Discharge Summary  [name] [name] UVO:[id number] DOB: [birth year] DOA: 11/15/2012  PCP: No primary provider on file.  Admit date: 11/15/2012 Discharge date: 11/20/2012  Recommendations for Outpatient Follow-up:  1. Pt will need to follow up with PCP in 1 week post discharge 2. Please obtain BMP to evaluate electrolytes and kidney function 3. Please also check CBC to evaluate Hg and Hct levels  Discharge Diagnoses:  Sepsis  -present at time of admission  -Secondary to acute diverticulitis  -WBC improving  -Blood cultures x2 sets --neg -Discontinued Zosyn, started oral Levaquin and Flagyl x 7 days more Acute diverticulitis  -Appears to be clinically improving with decreased abdominal pain and improved vomiting  -tolerating diet -Convert to by mouth antibiotics as discussed above  Acute kidney injury  -Likely a combination of ATN and hypovolemia  -Continue IV hydration  -Renal ultrasound negative for hydronephrosis  -Serum creatinine continues to improve  Hypokalemia  -Replete  -Partly due to diarrhea and hypomagnesemia  -Repleted magnesium  Diarrhea  -Questran when necessary  -Clostridium difficile toxin negative x2   Antibiotics:  Cipro December 27 >> December 29  Flagyl December 27 >>> December 29  Zosyn December 29 >>> December 31  Levaquin December 31>>>  Flagyl December 31>>>   Discharge Condition: stable  Disposition: home  Diet:cardiac Wt Readings from Last 3 Encounters:  11/19/12 66.701 kg (147 lb 0.8 oz)    History of present illness:  79 y.o. male, who is in excellent health and has no past medical problems all home medications, had last been lab work at primary care physician's office one year ago and it was normal, he apparently went out with family members on Tuesday to have breakfast at a [employer], following which he started developing abdominal pain and diarrhea with some emesis since day before admission. Pain inlowerquadrants, it is  intermittent and crampy in nature, associated with nausea vomiting diarrhea, nonradiating pain with no aggravating or relieving factors, he has been feeling thirsty and making less urine today, he [facility] as his symptoms are not getting better where he was diagnosed with diverticulitis on CT scan along with dehydration and acute renal failure.      Hospital Course:  The patient was found to be in acute renal failure with a serum creatinine of 4.40 at the time of admission. The patient was started on intravenous fluids. As the patient has been very healthy, there was no previous blood draws to clarify what his baseline renal function was. The patient was also found to be hypomagnesemic and hypokalemic secondary to his vomiting and diarrhea. C. difficile toxin was done and it was negative x2. Stool cultures were negative. Blood cultures x2 sets were negative. Influenza PCR was negative. CT of the abdomen and pelvis was obtained and showed mild acute diverticulitis of the sigmoid colon. Renal ultrasound did not show any hydronephrosis. There was a nonobstructive left kidney stone. There was no evidence of perforation or abscess on CT of the abdomen and pelvis. The patient was started on Cipro and Flagyl empirically. Initial white blood cell count was noted to be 13,900. Increased to 16,100 on the following day. The patient's antibiotic coverage was broadened to Zosyn. His Cipro and Flagyl were discontinued. After changing his antibiotics, the patient's white blood cell count improved and gradually came back to normal. With intravenous hydration and electrolyte replacement the patient's renal function improved. On the day of discharge, his serum creatinine was 1.58. The patient's potassium is  3.6 on day of discharge. Magnesium was normal. The patient was temporarily placed on bicarbonate replacement do to his gap metabolic acidosis. This was thought to be due to his acute renal failure  with superimposed diarrhea which likely contributed to a concomitant non-gap metabolic acidosis. The patient's vomiting improved. His diet was gradually advanced. On the day of discharge, the patient had been tolerating a cardiac diet x48 hours. The patient's loose stool gradually improved with Questran 4 g daily. On the day of discharge, the patient states that his stool was firming up. There was no hematochezia or melena. The patient remained hemodynamically stable. On the day prior to discharge, the patient's Zosyn was discontinued, and the patient was started on Levaquin and Flagyl orally. He tolerated it well. The patient remained afebrile and hemodynamically stable, and he continued to improve. The patient will be discharged with Levaquin and Flagyl for 7 additional days which would complete 11 days of therapy.   Consultants: none  Discharge Exam: Filed Vitals:   11/20/12 0446  BP: 132/75  Pulse: 73  Temp: 98.3 F (36.8 C)  Resp: 18   Filed Vitals:   11/19/12 1400 11/19/12 1816 11/19/12 2013 11/20/12 0446  BP: 108/80 123/73 119/68 132/75  Pulse: 84 84 75 73  Temp: 97.2 F (36.2 C) 98.7 F (37.1 C) 97.5 F (36.4 C) 98.3 F (36.8 C)  TempSrc: Oral Oral Oral Oral  Resp: 18 18 18 18   Height:   5' 10.5" (1.791 m)   Weight:   66.701 kg (147 lb 0.8 oz)   SpO2: 100% 100% 96% 94%   General: A&O x 3, NAD, pleasant, cooperative Cardiovascular: RRR, no rub, no gallop, no S3 Respiratory: CTAB, no wheeze, no rhonchi Abdomen:soft, nontender, nondistended, positive bowel sounds Extremities: No edema, No lymphangitis, no petechiae  Discharge Instructions  Discharge Orders    Future Orders Please Complete By Expires   Diet - low sodium heart healthy      Increase activity slowly      Discharge instructions      Comments:   Follow up with your primary care doctor in 1 week       Medication List     As of 11/20/2012  8:55 AM    TAKE these medications          CALCIUM-MAGNESIUM-ZINC PO   Take 1 tablet by mouth daily.      CINNAMON PO   Take 1 tablet by mouth daily.      CO Q-10 PO   Take 1 capsule by mouth daily.      fish oil-omega-3 fatty acids 1000 MG capsule   Take 1 g by mouth daily.      levofloxacin 750 MG tablet   Commonly known as: LEVAQUIN   Take 1 tablet (750 mg total) by mouth every other day.      metroNIDAZOLE 500 MG tablet   Commonly known as: FLAGYL   Take 1 tablet (500 mg total) by mouth every 8 (eight) hours.      NIACIN PO   Take 1 tablet by mouth daily.      POTASSIUM PO   Take 1 tablet by mouth daily.      VITAMIN A PO   Take 1 tablet by mouth daily.      VITAMIN B-12 PO   Take 1 tablet by mouth daily.      VITAMIN B-6 PO   Take 1 tablet by mouth daily.      VITAMIN  C PO   Take 1 tablet by mouth daily.      VITAMIN E PO   Take 1 tablet by mouth daily.      ZINC PO   Take 1 tablet by mouth daily.      ASK your doctor about these medications         VITAMIN D PO   Take 1 tablet by mouth daily.         The results of significant diagnostics from this hospitalization (including imaging, microbiology, ancillary and laboratory) are listed below for reference.    Significant Diagnostic Studies: Ct Abdomen Pelvis Wo Contrast  11/15/2012  *RADIOLOGY REPORT*  Clinical Data: Vomiting for 3 days.  CT ABDOMEN AND PELVIS WITHOUT CONTRAST  Technique:  Multidetector CT imaging of the abdomen and pelvis was performed following the standard protocol without intravenous contrast.  Comparison: Report from CT of the abdomen and pelvis performed 04/08/2010  Findings: Minimal bibasilar atelectasis is noted; scattered emphysematous change is seen at the lung bases.  Mild scattered coronary artery calcifications are noted.  Postoperative change is noted along the gastroesophageal junction.  The liver and spleen are unremarkable in appearance. The patient is status post cholecystectomy, with clips noted at the  gallbladder fossa.  The slightly decreased attenuation at the pancreatic head is thought to reflect beam hardening artifact from adjacent contrast.  The pancreas and adrenal glands are grossly unremarkable in appearance.  Scattered small left renal stones are seen, measuring up to 6 mm in size.  Mild left-sided renal pelvicaliectasis remains within normal limits, and likely reflects the inflammatory process of the sigmoid colon impressing against the distal left ureter. The right kidney is grossly unremarkable in appearance.  Minimal nonspecific right- sided perinephric stranding is seen.  The small bowel is unremarkable in appearance.  The stomach is within normal limits.  No acute vascular abnormalities are seen. Numerous scattered mildly prominent mesenteric nodes are seen, nonspecific in appearance.  Relatively diffuse calcification is noted along the abdominal aorta and its branches, with mild ectasia of the distal abdominal aorta.  The appendix is normal in caliber, without evidence for appendicitis.  There is focal soft tissue inflammation noted along the mid sigmoid colon, with associated inflamed diverticula, compatible with mild acute diverticulitis.  Additional diverticulosis is seen scattered about the sigmoid colon.  The colon is largely filled with fluid and is otherwise unremarkable in appearance.  There is no evidence of perforation or abscess formation.  No significant free fluid is seen.  The bladder is mildly distended and grossly unremarkable.  The prostate is borderline enlarged, measuring 4.9 cm in transverse dimension.  No inguinal lymphadenopathy is seen.  No acute osseous abnormalities are identified.  Vacuum phenomenon and disc space narrowing are noted at L4-L5, with mild associated endplate sclerotic change.  An associated disc protrusion is seen, without evidence of impression on exiting nerve roots.  IMPRESSION:  1.  Mild acute diverticulitis noted along the mid sigmoid colon, with  associated focal soft tissue inflammation.  No evidence of perforation or abscess formation; no free fluid seen. 2.  Diverticulosis scattered about the sigmoid colon. 3.  Relatively diffuse calcification along the abdominal aorta and its branches. 4.  Scattered small left renal stones seen, measuring up to 6 mm in size.  Mild left-sided pelvicaliectasis appears to reflect the inflammatory process at the sigmoid colon impressing against the distal left ureter; no evidence of hydronephrosis. 5.  Mild scattered coronary artery calcifications seen. 6.  Scattered  emphysematous change at the lung bases. 7.  Borderline enlarged prostate.   Original Report Authenticated By: Tonia Ghent, M.D.    US Renal  11/16/2012  *RADIOLOGY REPORT*  Clinical Data: Acute renal failure.  RENAL/URINARY TRACT ULTRASOUND COMPLETE  Comparison:  CT 11/15/2012  Findings:  Right Kidney:  10.9 cm.  No focal abnormality.  No hydronephrosis. Normal echotexture.  Left Kidney:  10.0 cm.  Mild cortical thinning.  Echogenic shadowing foci within the left kidney compatible with nonobstructing stones as seen on prior CT.  No hydronephrosis. Normal echotexture.  Bladder:   Decompressed.  Not well visualized.  IMPRESSION: Mild cortical thinning within the left kidney.  Left nephrolithiasis.  No acute findings.   Original Report Authenticated By: Charlett Nose, M.D.      Microbiology: Recent Results (from the past 240 hour(s))  CLOSTRIDIUM DIFFICILE BY PCR     Status: Normal   Collection Time   11/15/12  9:15 PM      Component Value Range Status Comment   C difficile by pcr NEGATIVE  NEGATIVE Final   STOOL CULTURE     Status: Normal   Collection Time   11/16/12  3:19 AM      Component Value Range Status Comment   Specimen Description STOOL   Final    Special Requests Normal   Final    Culture     Final    Value: NO SALMONELLA, SHIGELLA, CAMPYLOBACTER, YERSINIA, OR E.COLI 0157:H7 ISOLATED   Report Status 11/19/2012 FINAL   Final     CLOSTRIDIUM DIFFICILE BY PCR     Status: Normal   Collection Time   11/16/12  3:20 AM      Component Value Range Status Comment   C difficile by pcr NEGATIVE  NEGATIVE Final   CULTURE, BLOOD (ROUTINE X 2)     Status: Normal (Preliminary result)   Collection Time   11/17/12 11:25 AM      Component Value Range Status Comment   Specimen Description BLOOD RIGHT ARM   Final    Special Requests BOTTLES DRAWN AEROBIC AND ANAEROBIC 10CC   Final    Culture  Setup Time 11/17/2012 18:44   Final    Culture     Final    Value:        BLOOD CULTURE RECEIVED NO GROWTH TO DATE CULTURE WILL BE HELD FOR 5 DAYS BEFORE ISSUING A FINAL NEGATIVE REPORT   Report Status PENDING   Incomplete   CULTURE, BLOOD (ROUTINE X 2)     Status: Normal (Preliminary result)   Collection Time   11/17/12 11:35 AM      Component Value Range Status Comment   Specimen Description BLOOD RIGHT HAND   Final    Special Requests BOTTLES DRAWN AEROBIC ONLY 5CC   Final    Culture  Setup Time 11/17/2012 18:44   Final    Culture     Final    Value:        BLOOD CULTURE RECEIVED NO GROWTH TO DATE CULTURE WILL BE HELD FOR 5 DAYS BEFORE ISSUING A FINAL NEGATIVE REPORT   Report Status PENDING   Incomplete      Labs: Basic Metabolic Panel:  Lab 11/20/12 4098 11/19/12 0600 11/18/12 0720 11/17/12 1056 11/16/12 0500  NA 136 135 135 132* 130*  K 3.6 3.9 -- -- --  CL 107 107 103 98 94*  CO2 19 17* 16* 17* 15*  GLUCOSE 85 98 81 82 92  BUN 22 32*  40* 54* 59*  CREATININE 1.58* 2.00* 2.32* 3.08* 3.89*  CALCIUM 8.1* 7.9* 8.1* 8.0* 7.6*  MG -- 2.2 2.5 1.2* 1.0*  PHOS -- -- -- -- --   Liver Function Tests:  Lab 11/15/12 2100  AST 225*  ALT 51  ALKPHOS 138*  BILITOT 0.7  PROT 8.3  ALBUMIN 3.7    Lab 11/15/12 2100  LIPASE 12  AMYLASE --   No results found for this basename: AMMONIA:5 in the last 168 hours CBC:  Lab 11/19/12 0600 11/18/12 0720 11/17/12 0739 11/16/12 0500 11/15/12 2100  WBC 10.2 11.0* 16.1* 13.9* 6.5   NEUTROABS -- 8.2* -- -- 5.2  HGB 13.7 14.4 15.5 15.1 18.3*  HCT 39.3 41.5 43.2 43.8 52.0  MCV 84.2 83.2 83.9 84.7 82.7  PLT 186 207 200 211 220   Cardiac Enzymes: No results found for this basename: CKTOTAL:5,CKMB:5,CKMBINDEX:5,TROPONINI:5 in the last 168 hours BNP: No components found with this basename: POCBNP:5 CBG: No results found for this basename: GLUCAP:5 in the last 168 hours  Time coordinating discharge:  Greater than 30 minutes  Signed:  Maryella Abood, DO [facility] Pager: [phone] 11/20/2012, 8:55 AM

## 2012-11-23 LAB — CULTURE, BLOOD (ROUTINE X 2)
Culture: NO GROWTH
Culture: NO GROWTH

## 2013-07-22 ENCOUNTER — Encounter: Payer: Self-pay | Admitting: Cardiology

## 2013-07-24 ENCOUNTER — Encounter: Payer: Self-pay | Admitting: Cardiology

## 2013-08-06 ENCOUNTER — Inpatient Hospital Stay (HOSPITAL_COMMUNITY)
Admission: EM | Admit: 2013-08-06 | Discharge: 2013-08-12 | DRG: 388 | Disposition: A | Discharge status: Home / Self Care | Payer: Medicare Other | Attending: General Surgery | Admitting: General Surgery

## 2013-08-06 ENCOUNTER — Encounter (HOSPITAL_COMMUNITY): Payer: Self-pay | Admitting: Emergency Medicine

## 2013-08-06 ENCOUNTER — Emergency Department (HOSPITAL_COMMUNITY): Payer: Medicare Other

## 2013-08-06 DIAGNOSIS — J810 Acute pulmonary edema: Secondary | ICD-10-CM

## 2013-08-06 DIAGNOSIS — J9601 Acute respiratory failure with hypoxia: Secondary | ICD-10-CM

## 2013-08-06 DIAGNOSIS — J690 Pneumonitis due to inhalation of food and vomit: Secondary | ICD-10-CM | POA: Diagnosis not present

## 2013-08-06 DIAGNOSIS — I5021 Acute systolic (congestive) heart failure: Secondary | ICD-10-CM

## 2013-08-06 DIAGNOSIS — R7309 Other abnormal glucose: Secondary | ICD-10-CM | POA: Diagnosis not present

## 2013-08-06 DIAGNOSIS — G9340 Encephalopathy, unspecified: Secondary | ICD-10-CM | POA: Diagnosis present

## 2013-08-06 DIAGNOSIS — K56609 Unspecified intestinal obstruction, unspecified as to partial versus complete obstruction: Principal | ICD-10-CM

## 2013-08-06 DIAGNOSIS — R109 Unspecified abdominal pain: Secondary | ICD-10-CM

## 2013-08-06 DIAGNOSIS — I2589 Other forms of chronic ischemic heart disease: Secondary | ICD-10-CM | POA: Diagnosis present

## 2013-08-06 DIAGNOSIS — J811 Chronic pulmonary edema: Secondary | ICD-10-CM | POA: Diagnosis not present

## 2013-08-06 DIAGNOSIS — J9600 Acute respiratory failure, unspecified whether with hypoxia or hypercapnia: Secondary | ICD-10-CM | POA: Diagnosis not present

## 2013-08-06 DIAGNOSIS — I509 Heart failure, unspecified: Secondary | ICD-10-CM | POA: Diagnosis present

## 2013-08-06 DIAGNOSIS — N183 Chronic kidney disease, stage 3 unspecified: Secondary | ICD-10-CM

## 2013-08-06 DIAGNOSIS — R112 Nausea with vomiting, unspecified: Secondary | ICD-10-CM

## 2013-08-06 DIAGNOSIS — I2510 Atherosclerotic heart disease of native coronary artery without angina pectoris: Secondary | ICD-10-CM | POA: Diagnosis present

## 2013-08-06 HISTORY — DX: Chronic kidney disease, stage 3 unspecified: N18.30

## 2013-08-06 HISTORY — DX: Chronic kidney disease, stage 3 (moderate): N18.3

## 2013-08-06 HISTORY — DX: Perforation of intestine (nontraumatic): K63.1

## 2013-08-06 HISTORY — DX: Diverticulitis of intestine, part unspecified, without perforation or abscess without bleeding: K57.92

## 2013-08-06 LAB — LIPASE, BLOOD: Lipase: 40 U/L (ref 11–59)

## 2013-08-06 LAB — CBC WITH DIFFERENTIAL/PLATELET
Basophils Absolute: 0 10*3/uL (ref 0.0–0.1)
Basophils Relative: 0 % (ref 0–1)
Eosinophils Absolute: 0.2 10*3/uL (ref 0.0–0.7)
Eosinophils Relative: 3 % (ref 0–5)
HCT: 46.4 % (ref 39.0–52.0)
Hemoglobin: 15.3 g/dL (ref 13.0–17.0)
Lymphocytes Relative: 25 % (ref 12–46)
Lymphs Abs: 2 10*3/uL (ref 0.7–4.0)
MCH: 28.8 pg (ref 26.0–34.0)
MCHC: 33 g/dL (ref 30.0–36.0)
MCV: 87.4 fL (ref 78.0–100.0)
Monocytes Absolute: 0.6 10*3/uL (ref 0.1–1.0)
Monocytes Relative: 7 % (ref 3–12)
Neutro Abs: 5.5 10*3/uL (ref 1.7–7.7)
Neutrophils Relative %: 66 % (ref 43–77)
Platelets: 161 10*3/uL (ref 150–400)
RBC: 5.31 MIL/uL (ref 4.22–5.81)
RDW: 14.4 % (ref 11.5–15.5)
WBC: 8.3 10*3/uL (ref 4.0–10.5)

## 2013-08-06 LAB — COMPREHENSIVE METABOLIC PANEL
ALT: 16 U/L (ref 0–53)
AST: 23 U/L (ref 0–37)
Albumin: 3.9 g/dL (ref 3.5–5.2)
Alkaline Phosphatase: 122 U/L — ABNORMAL HIGH (ref 39–117)
BUN: 16 mg/dL (ref 6–23)
CO2: 26 mEq/L (ref 19–32)
Calcium: 9.6 mg/dL (ref 8.4–10.5)
Chloride: 103 mEq/L (ref 96–112)
Creatinine, Ser: 1.25 mg/dL (ref 0.50–1.35)
GFR calc Af Amer: 61 mL/min — ABNORMAL LOW (ref >90)
GFR calc non Af Amer: 53 mL/min — ABNORMAL LOW (ref >90)
Glucose, Bld: 150 mg/dL — ABNORMAL HIGH (ref 70–99)
Potassium: 4.2 mEq/L (ref 3.5–5.1)
Sodium: 141 mEq/L (ref 135–145)
Total Bilirubin: 0.3 mg/dL (ref 0.3–1.2)
Total Protein: 7.8 g/dL (ref 6.0–8.3)

## 2013-08-06 LAB — URINALYSIS, ROUTINE W REFLEX MICROSCOPIC
Bilirubin Urine: NEGATIVE
Glucose, UA: NEGATIVE mg/dL
Hgb urine dipstick: NEGATIVE
Ketones, ur: NEGATIVE mg/dL
Nitrite: NEGATIVE
Protein, ur: NEGATIVE mg/dL
Specific Gravity, Urine: 1.016 (ref 1.005–1.030)
Urobilinogen, UA: 0.2 mg/dL (ref 0.0–1.0)
pH: 5 (ref 5.0–8.0)

## 2013-08-06 LAB — TYPE AND SCREEN
ABO/RH(D): O POS
Antibody Screen: NEGATIVE

## 2013-08-06 LAB — URINE MICROSCOPIC-ADD ON

## 2013-08-06 LAB — POCT I-STAT TROPONIN I: Troponin i, poc: 0.02 ng/mL (ref 0.00–0.08)

## 2013-08-06 LAB — ABO/RH: ABO/RH(D): O POS

## 2013-08-06 LAB — CG4 I-STAT (LACTIC ACID): Lactic Acid, Venous: 4.33 mmol/L — ABNORMAL HIGH (ref 0.5–2.2)

## 2013-08-06 LAB — LACTIC ACID, PLASMA: Lactic Acid, Venous: 1.1 mmol/L (ref 0.5–2.2)

## 2013-08-06 LAB — TROPONIN I: Troponin I: <0.3 ng/mL (ref <0.30)

## 2013-08-06 IMAGING — US US AORTA
1 series · 14 of 16 positions shown · non-contrast
Comparison: CT scan dated [DATE]

CLINICAL DATA: Abdominal pain.

ULTRASOUND OF ABDOMINAL AORTA
TECHNIQUE: Ultrasound examination of the abdominal aorta was
performed to evaluate for abdominal aortic aneurysm.

[Series 1: us aorta · 0.27mm/px · 14 of 16 slices shown]
[im 1/16]
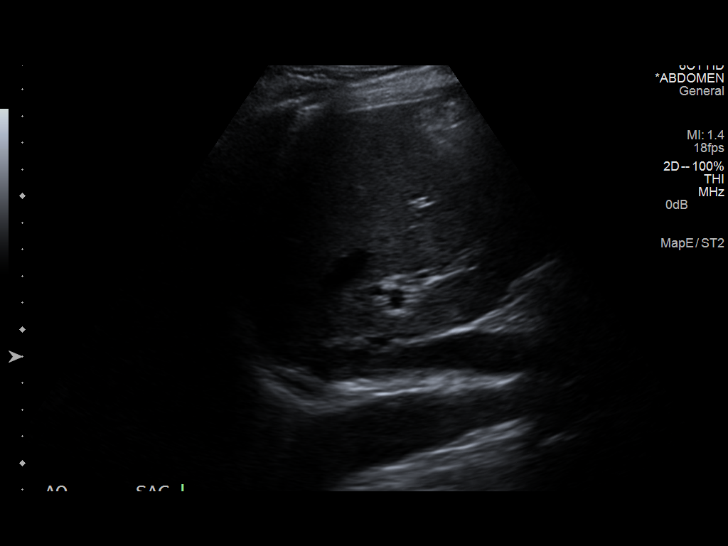
[im 2/16]
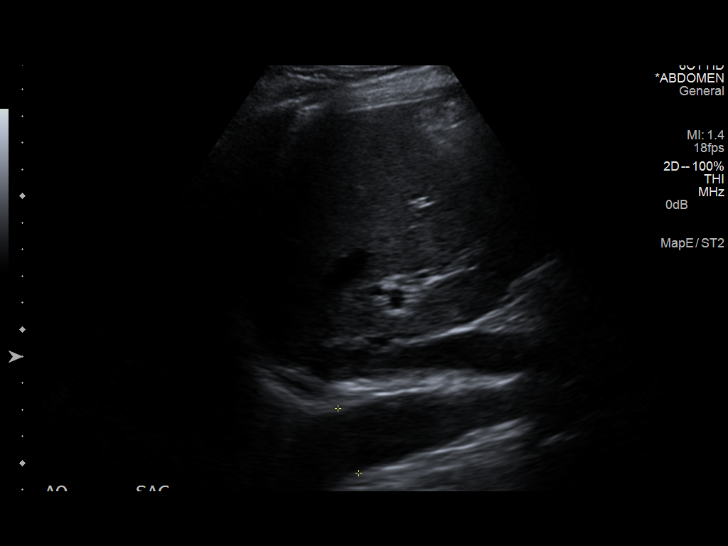
[im 3/16]
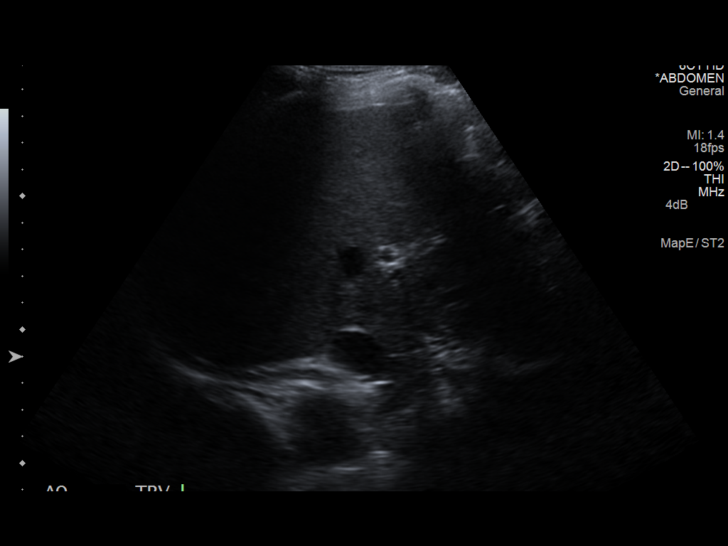
[im 5/16]
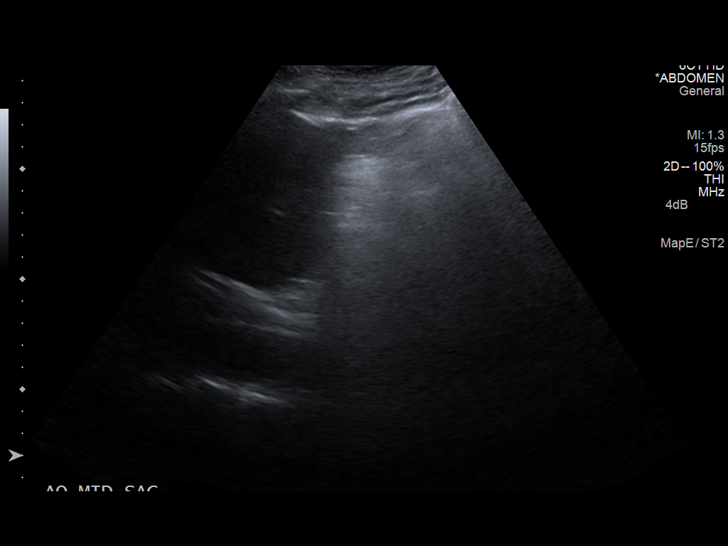
[im 6/16]
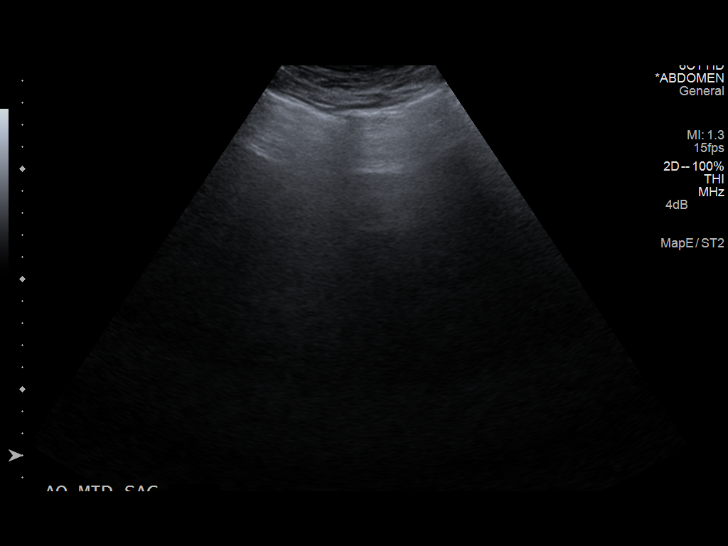
[im 7/16]
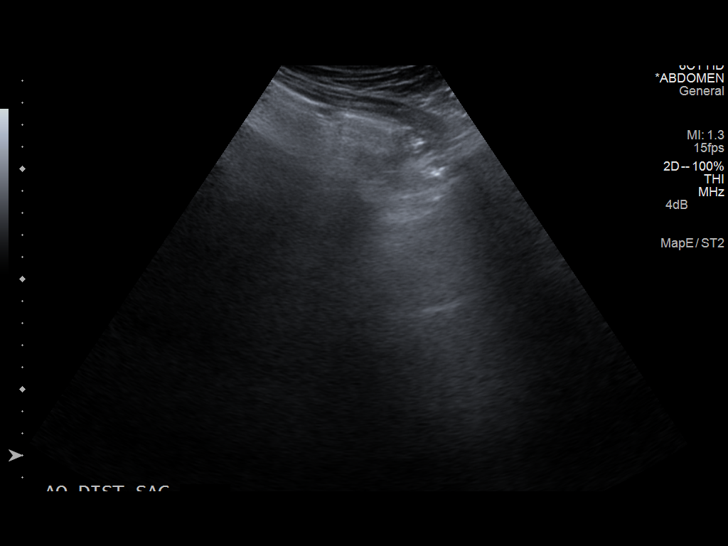
[im 8/16]
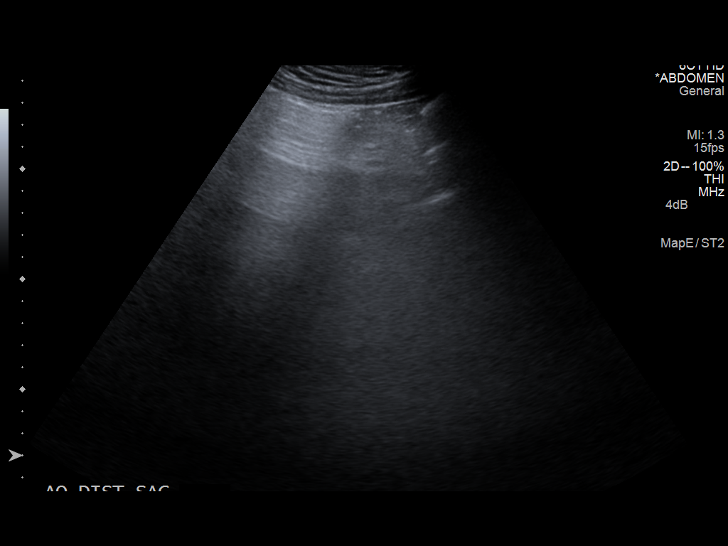
[im 9/16]
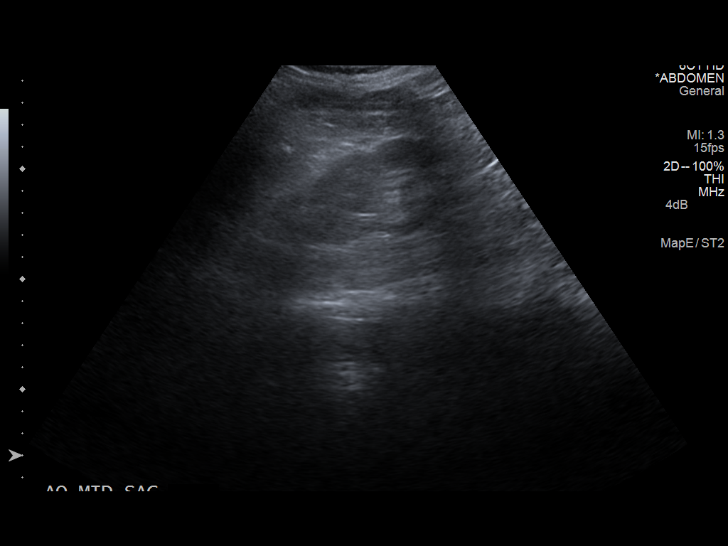
[im 10/16]
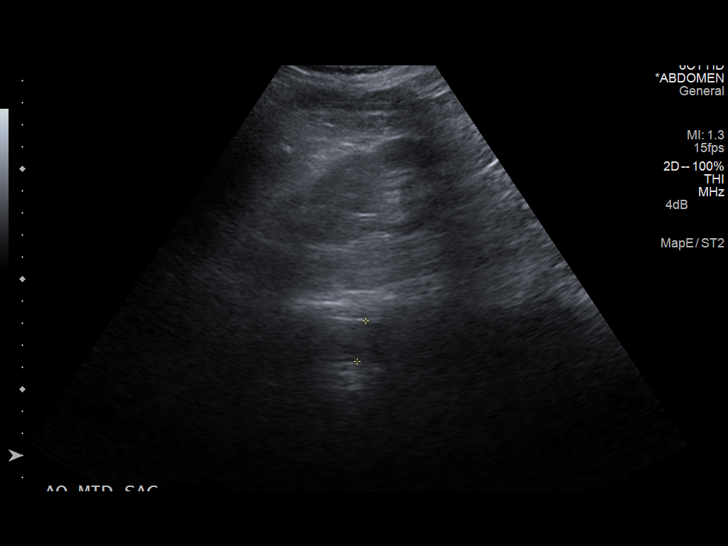
[im 11/16]
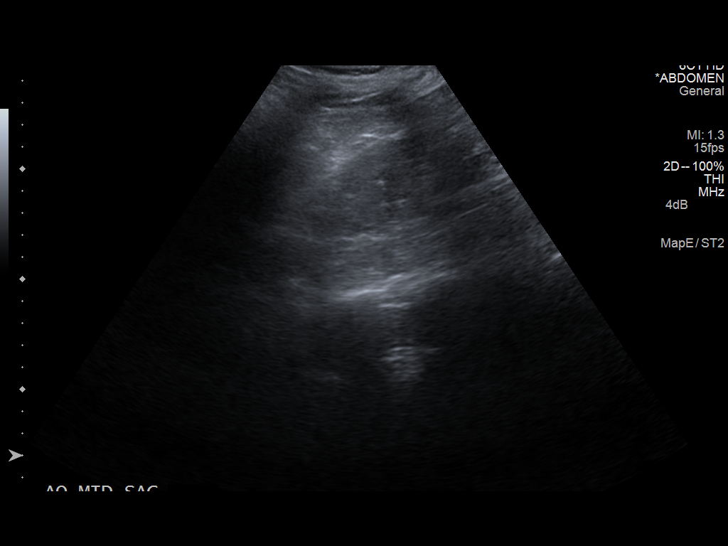
[im 13/16]
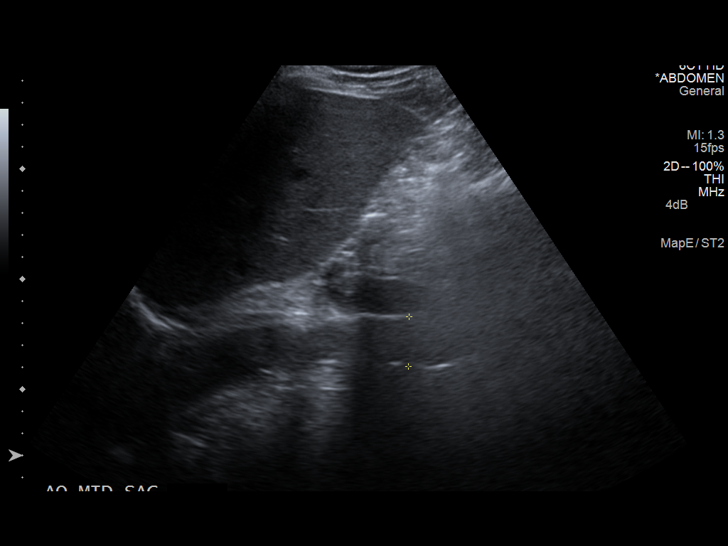
[im 14/16]
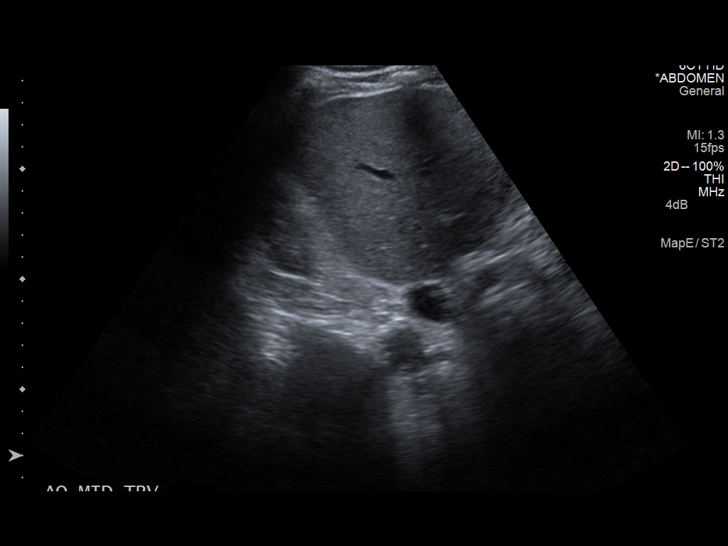
[im 15/16]
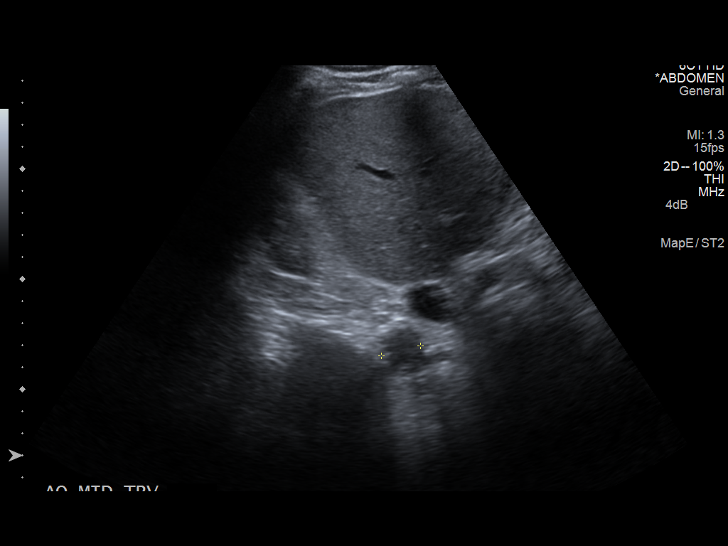
[im 16/16]
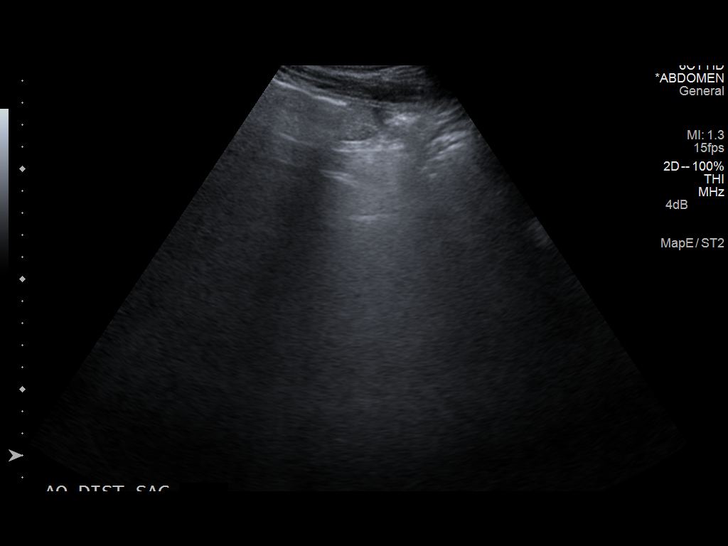

[14 of 16 positions shown; findings below may reference images not displayed]

Abdominal Aorta:  No aneurysm identified.

      Maximum AP diameter:  2.5 cm.
      Maximum TRV diameter:  2.8 cm.
IMPRESSION: No abdominal aortic aneurysm identified.Atherosclerotic
irregularity.

## 2013-08-06 IMAGING — CT CT ABD-PELV W/ CM
1 of 3 series · 14 of 32 positions shown, 19 images · IV contrast (OMNIPAQUE 300)
Comparison: [DATE]

CLINICAL DATA: Right flank pain, dysuria.

EXAM:
CT ABDOMEN AND PELVIS WITH CONTRAST
TECHNIQUE: Multidetector CT imaging of the abdomen and pelvis was performed
using the standard protocol following bolus administration of
intravenous contrast.
CONTRAST:  80mL OMNIPAQUE IOHEXOL 300 MG/ML  SOLN

[Series 2: abd/pel with · axial · 0.79mm/px · z∈[-523,-103]mm · 14 of 94 slices shown, 19 images]
[im 5/94  soft-tissue]
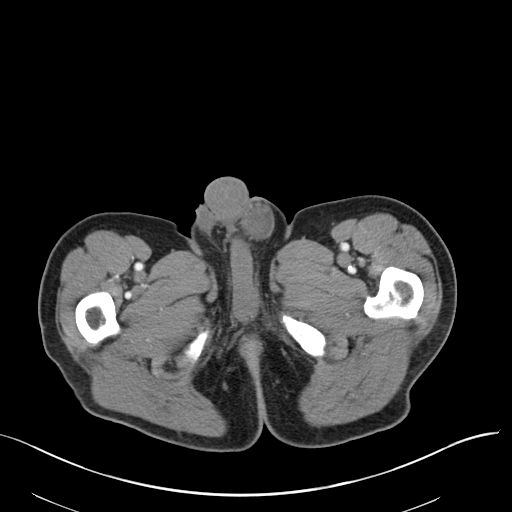
[im 5/94  bone]
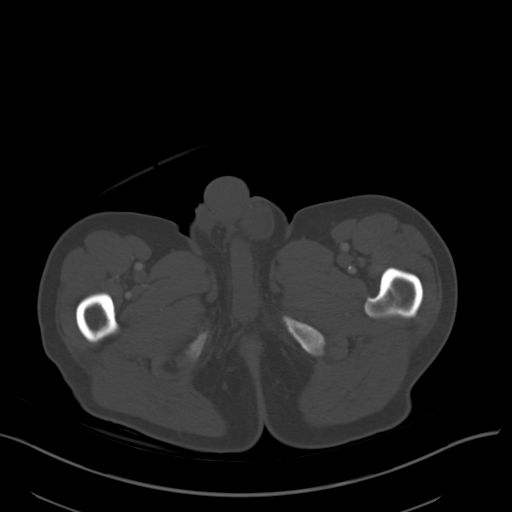
[im 15/94  soft-tissue]
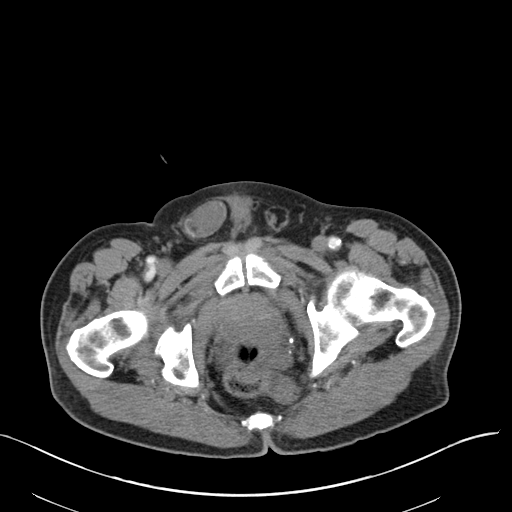
[im 20/94  soft-tissue]
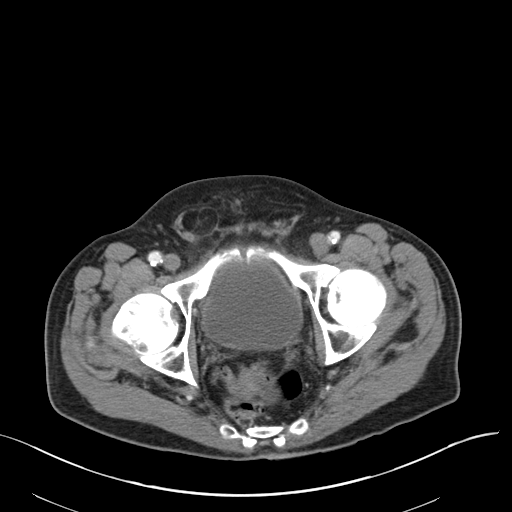
[im 25/94  soft-tissue]
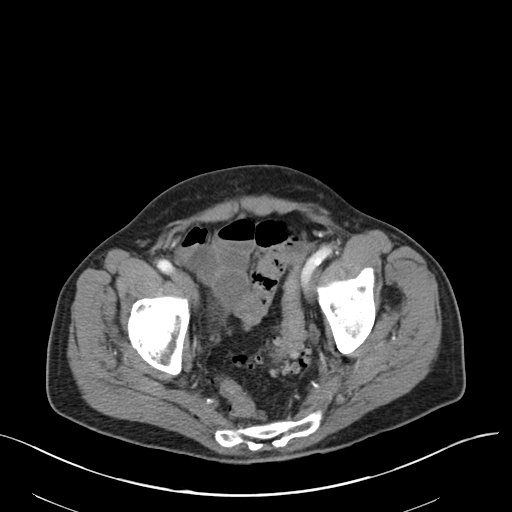
[im 35/94  soft-tissue]
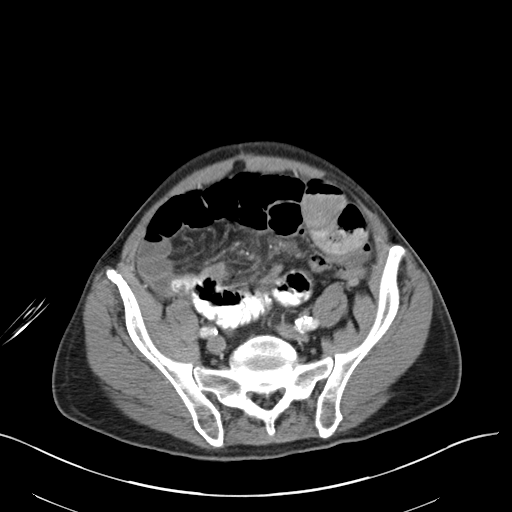
[im 40/94  soft-tissue]
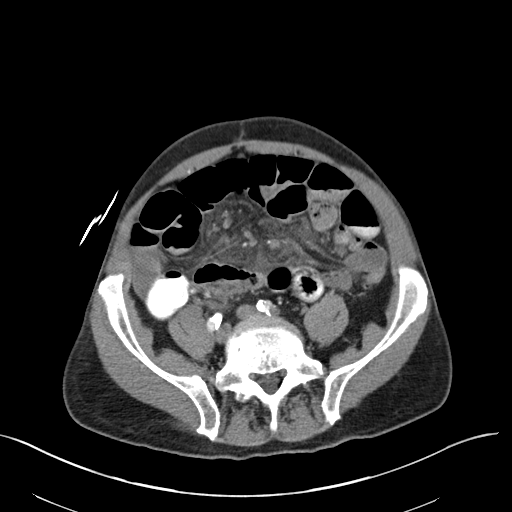
[im 49/94  soft-tissue]
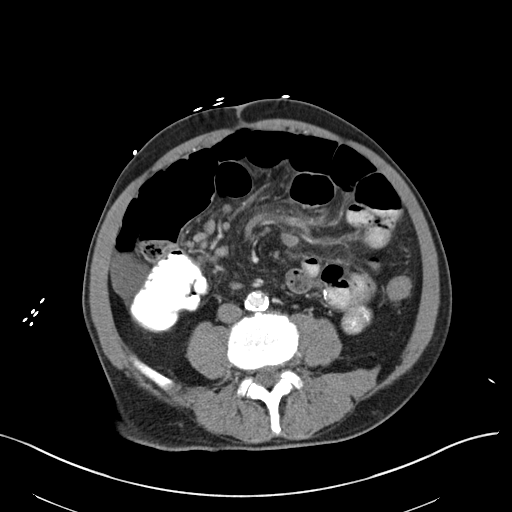
[im 54/94  soft-tissue]
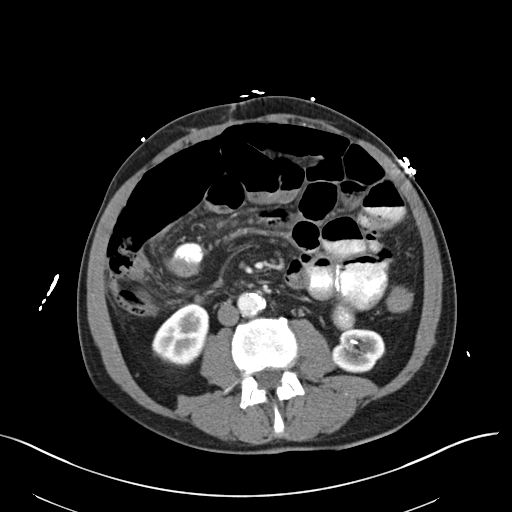
[im 59/94  soft-tissue]
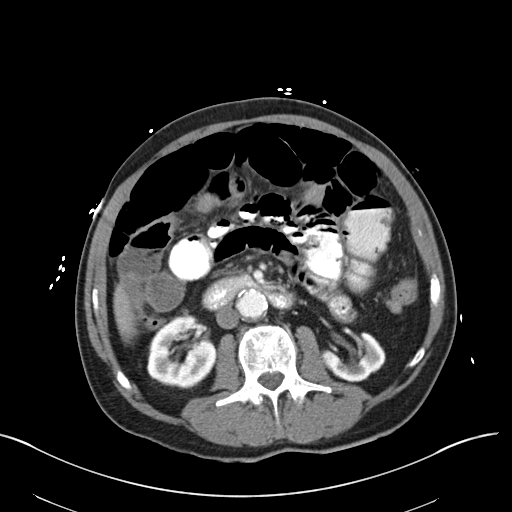
[im 59/94  bone]
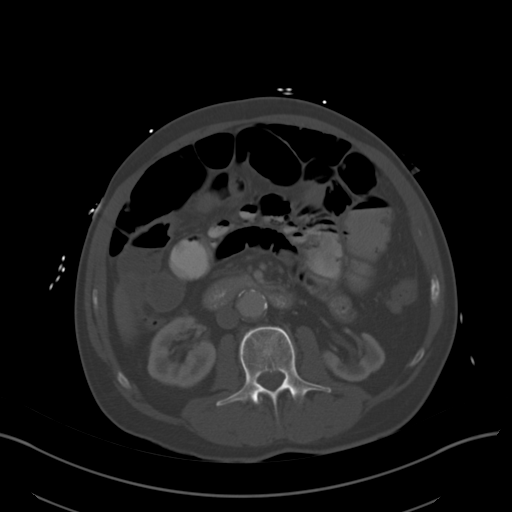
[im 69/94  soft-tissue]
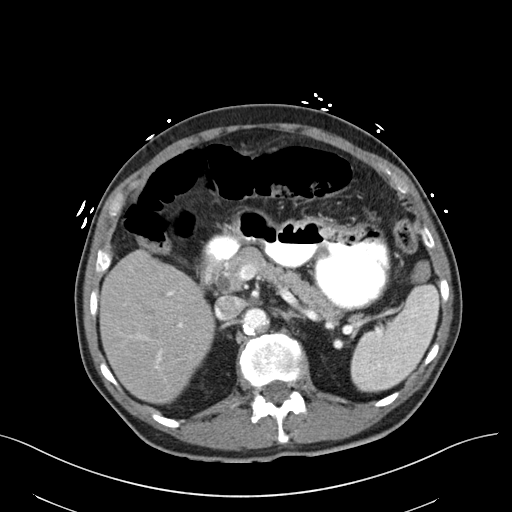
[im 74/94  soft-tissue]
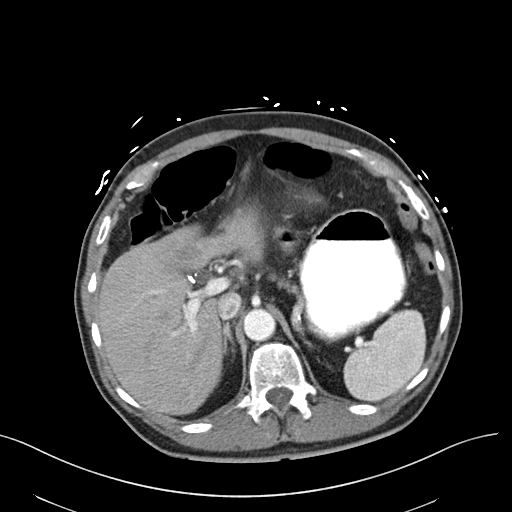
[im 74/94  lung]
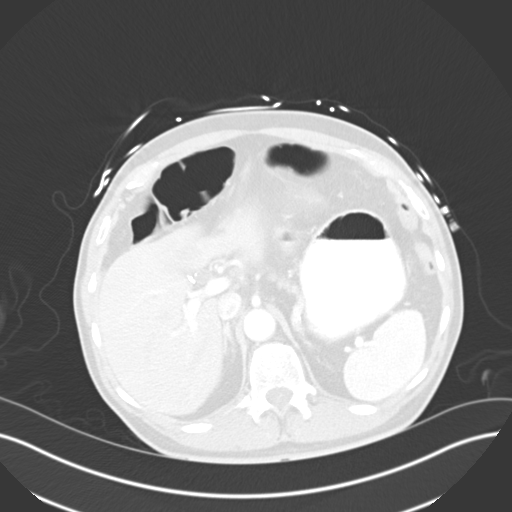
[im 79/94  soft-tissue]
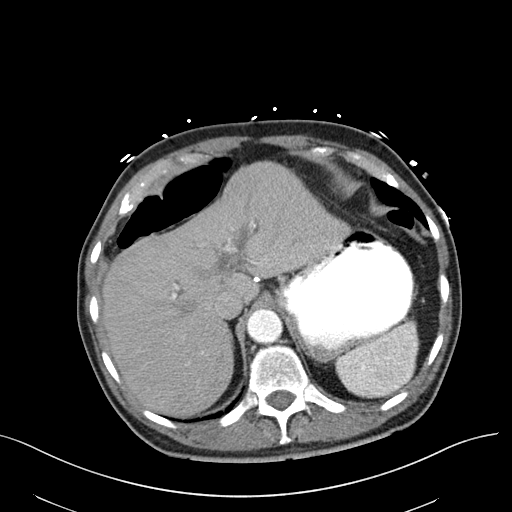
[im 79/94  lung]
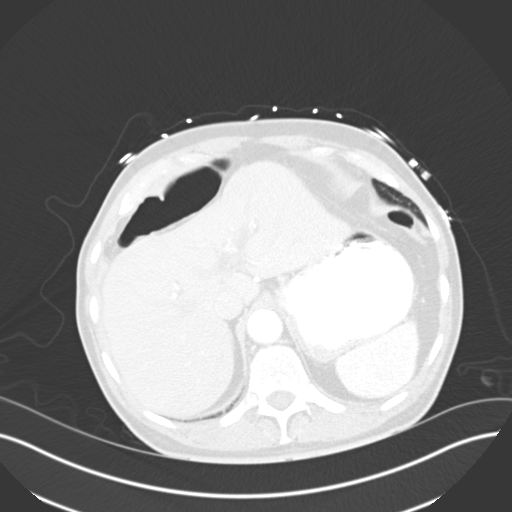
[im 84/94  lung]
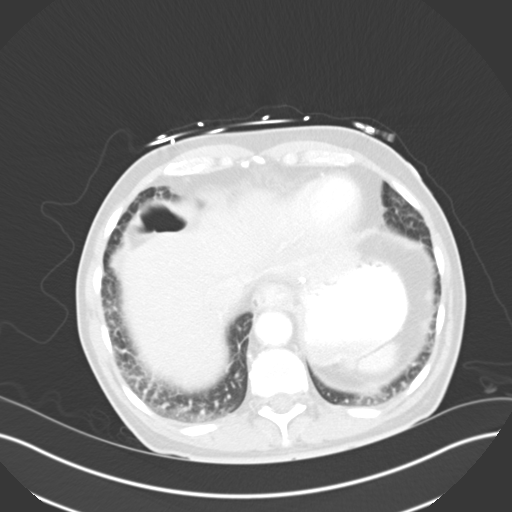
[im 89/94  soft-tissue]
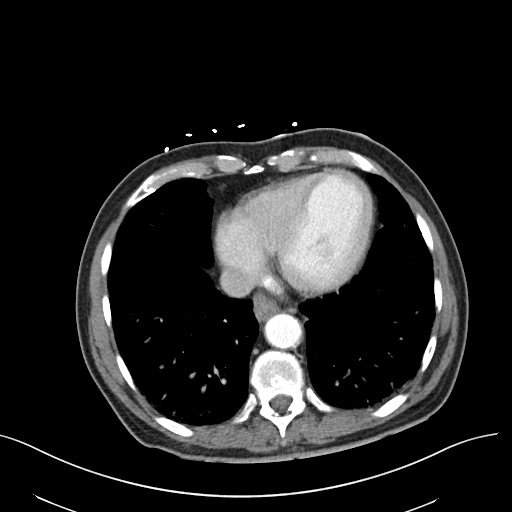
[im 89/94  lung]
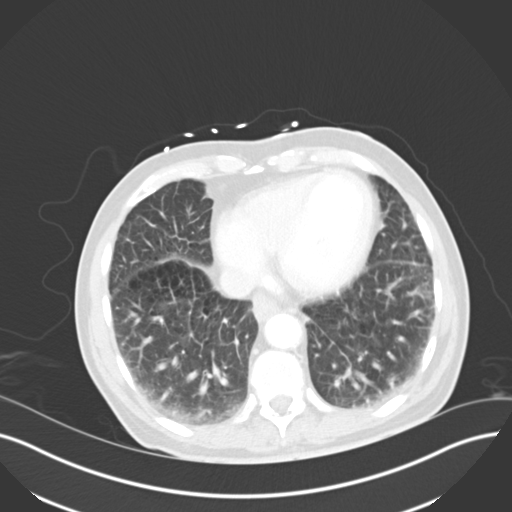

[14 of 32 positions shown; findings below may reference images not displayed]

FINDINGS: Dependent atelectasis in the visualized lung bases.

Coronary calcifications. Surgical clips in the gallbladder fossa.
Stable prominence of the common duct without significant
intrahepatic biliary ductal dilatation. Unremarkable spleen, adrenal
glands, pancreas, right kidney. There is mild left lower pole renal
parenchymal atrophy with a 6 mm calculus in the lower pole. No
hydronephrosis. Moderate aortoiliac atheromatous plaque. Stomach
physiologically distended. Multiple distended small bowel loops with
poor progression of the oral contrast material beyond the proximal
small bowel. Anastomotic staple line is noted in the mid jejunum.
There is no definite transition point however to any distal
decompressed small bowel. The colon is nondilated, with multiple
distal descending and sigmoid diverticula. No definite abscess.
Urinary bladder is physiologically distended. Mild enlargement of
prostate. No ascites. No free air. Interval increase in number of
visible central mesentery lymph nodes, some enlarged up to 1 cm
short axis diameter. There is mild edema in the lower mesentery.
Degenerate disc disease L4-5.
IMPRESSION: 1. Dilated small bowel with poor progression of oral contrast
material, no definite transition point or etiology of obstruction.
2. Progressive central mesenteric adenopathy with some mild
mesenteric edema.
3. Left nephrolithiasis without hydronephrosis.

## 2013-08-06 IMAGING — CR DG ABDOMEN ACUTE W/ 1V CHEST
3 series · 3 of 3 positions shown · non-contrast
Comparison: CT [DATE].

CLINICAL DATA: Upper abdominal pain, nausea, vomiting.

EXAM:
ACUTE ABDOMEN SERIES (ABDOMEN 2 VIEW & CHEST 1 VIEW)

[w abdomen decub]
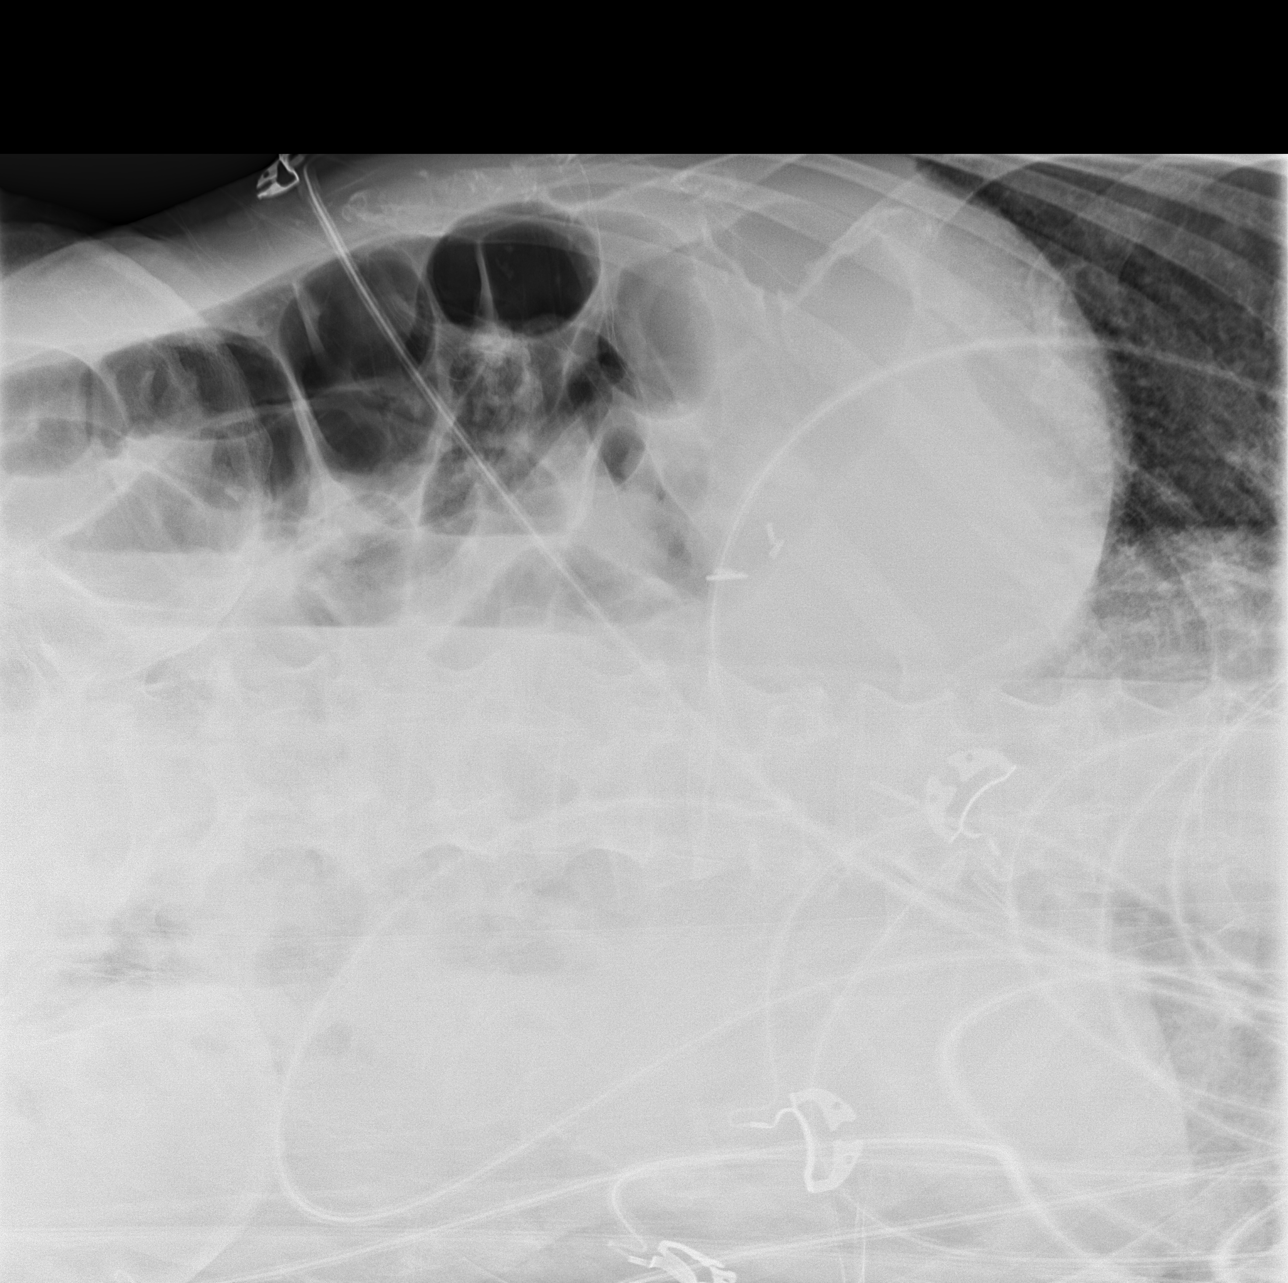

[x abdomen supine]
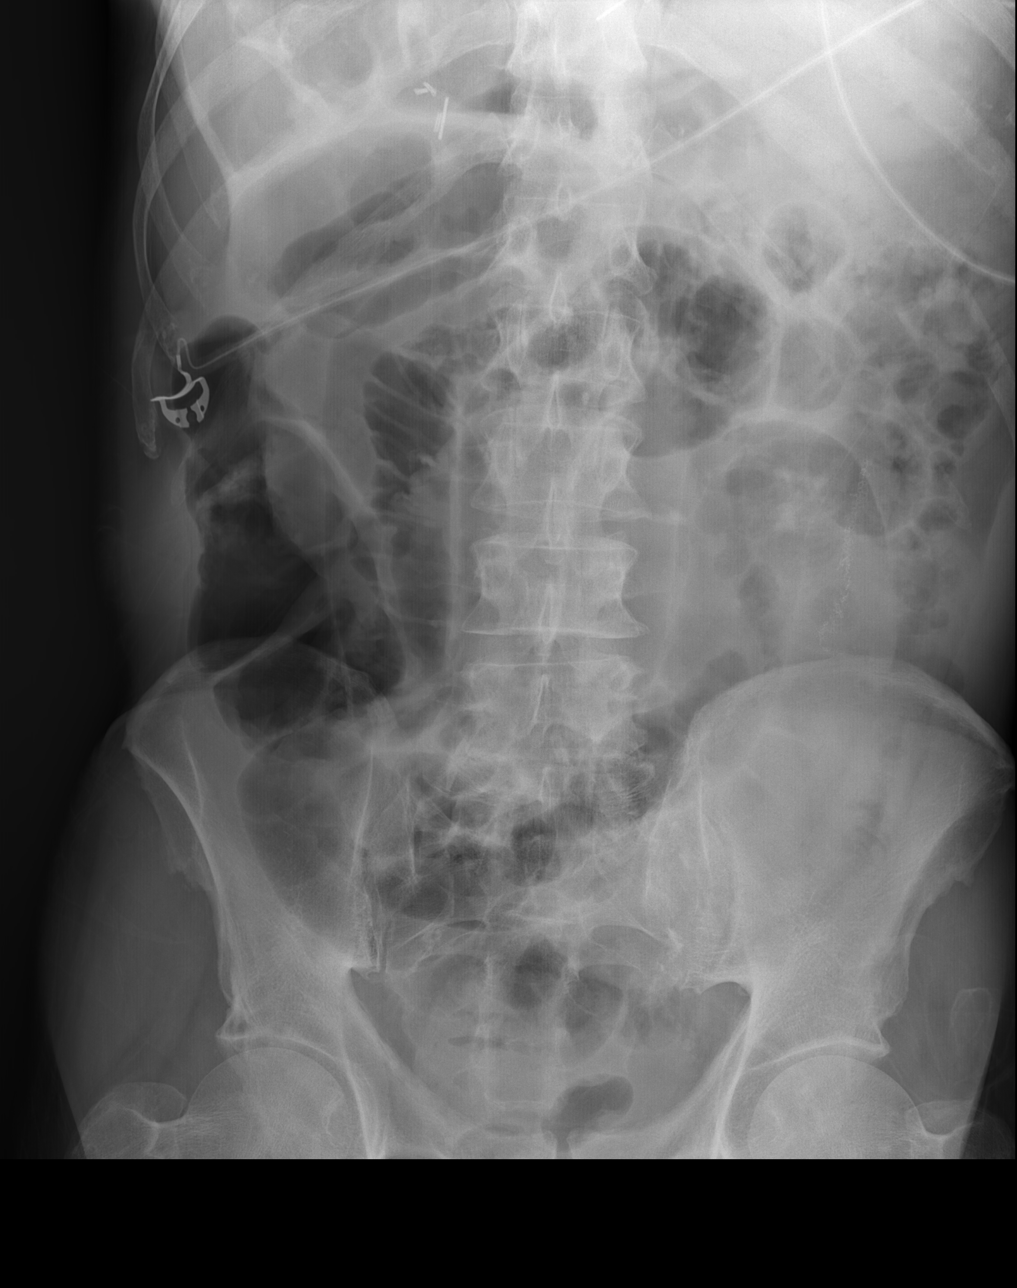

[x chest ap]
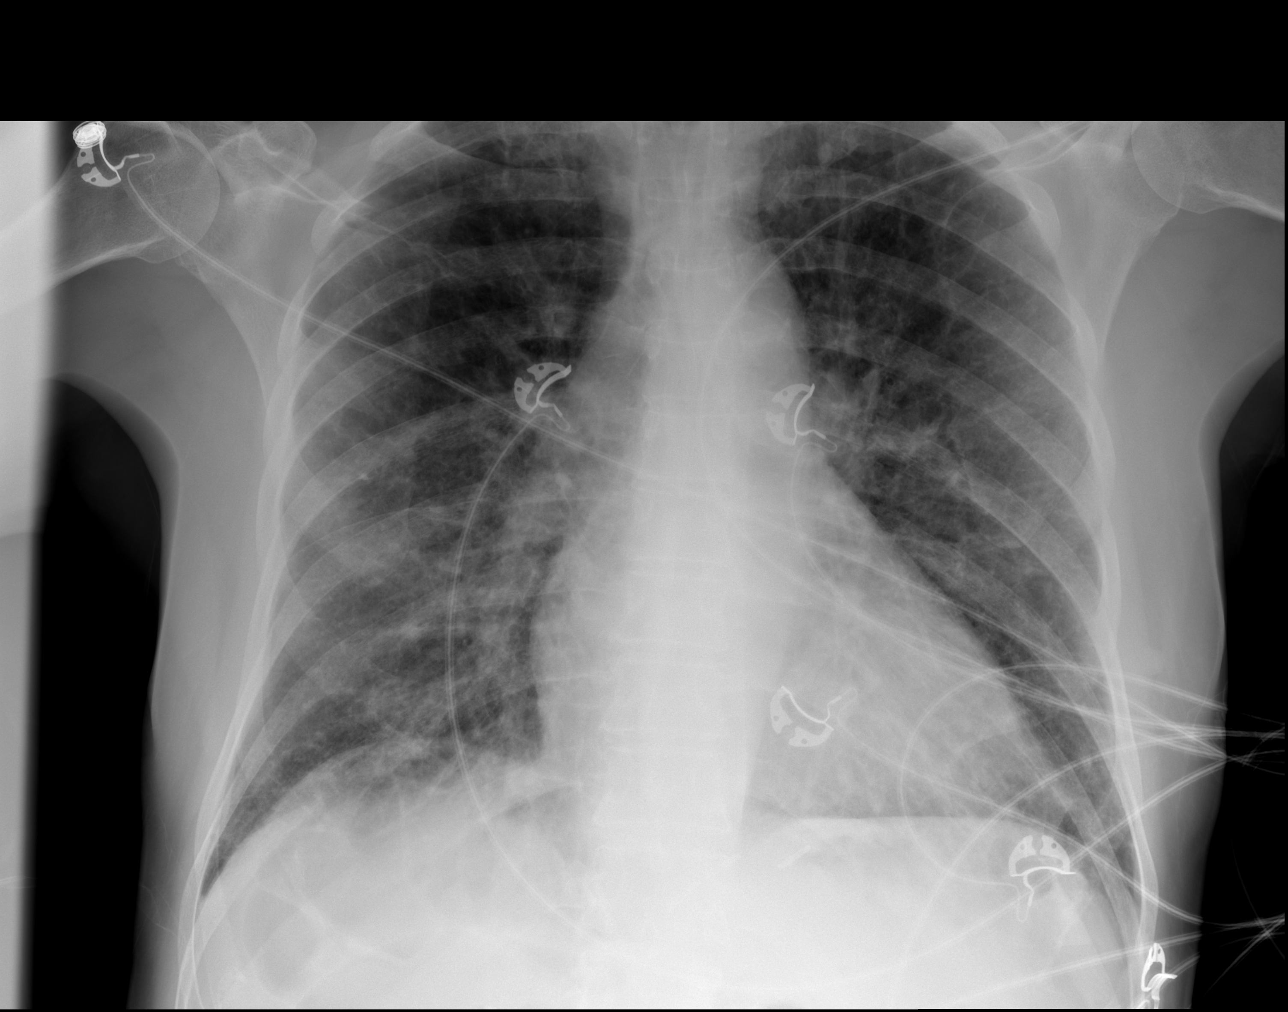

[3 of 3 positions shown; findings below may reference images not displayed]

FINDINGS: Heart is borderline in size. Mild interstitial prominence throughout
the lungs, likely chronic interstitial lung disease. No confluent
opacities or effusions.

Mild gaseous distension of bowel throughout the abdomen and pelvis.
This may reflect mild ileus. No evidence of bowel obstruction or
free air. Prior cholecystectomy. No organomegaly. No suspicious
calcification.
IMPRESSION: Mild diffuse gaseous distention of bowel, question mild ileus. No
evidence of bowel obstruction or free air.

Diffuse interstitial prominence throughout the lungs, likely chronic
interstitial lung disease.

## 2013-08-06 MED ORDER — MORPHINE SULFATE 4 MG/ML IJ SOLN
4.0000 mg | Freq: Once | INTRAMUSCULAR | Status: AC
  Administered 2013-08-06: 4 mg via INTRAVENOUS
  Filled 2013-08-06: qty 1

## 2013-08-06 MED ORDER — ONDANSETRON HCL 4 MG/2ML IJ SOLN
4.0000 mg | Freq: Once | INTRAMUSCULAR | Status: AC
  Administered 2013-08-06: 4 mg via INTRAVENOUS
  Filled 2013-08-06: qty 2

## 2013-08-06 MED ORDER — HYDROCORTISONE 1 % EX CREA
1.0000 "application " | TOPICAL_CREAM | Freq: Two times a day (BID) | CUTANEOUS | Status: DC | PRN
  Filled 2013-08-06: qty 28

## 2013-08-06 MED ORDER — MORPHINE SULFATE 2 MG/ML IJ SOLN
2.0000 mg | INTRAMUSCULAR | Status: DC | PRN
  Administered 2013-08-06: 2 mg via INTRAVENOUS
  Filled 2013-08-06: qty 1

## 2013-08-06 MED ORDER — IOHEXOL 300 MG/ML  SOLN
100.0000 mL | Freq: Once | INTRAMUSCULAR | Status: AC | PRN
  Administered 2013-08-06: 80 mL via INTRAVENOUS

## 2013-08-06 MED ORDER — HEPARIN SODIUM (PORCINE) 5000 UNIT/ML IJ SOLN
5000.0000 [IU] | Freq: Three times a day (TID) | INTRAMUSCULAR | Status: DC
  Administered 2013-08-06 – 2013-08-12 (×17): 5000 [IU] via SUBCUTANEOUS
  Filled 2013-08-06 (×20): qty 1

## 2013-08-06 MED ORDER — ONDANSETRON HCL 4 MG/2ML IJ SOLN: 4.0000 mg | Freq: Four times a day (QID) | INTRAMUSCULAR | Status: DC | PRN

## 2013-08-06 MED ORDER — POLYVINYL ALCOHOL 1.4 % OP SOLN
1.0000 [drp] | Freq: Three times a day (TID) | OPHTHALMIC | Status: DC | PRN
  Filled 2013-08-06: qty 15

## 2013-08-06 MED ORDER — SODIUM CHLORIDE 0.9 % IV SOLN
INTRAVENOUS | Status: DC
  Administered 2013-08-06 – 2013-08-08 (×4): via INTRAVENOUS

## 2013-08-06 MED ORDER — CARBOXYMETHYLCELLULOSE SODIUM 0.5 % OP SOLN: 1.0000 [drp] | Freq: Three times a day (TID) | OPHTHALMIC | Status: DC | PRN

## 2013-08-06 MED ORDER — SODIUM CHLORIDE 0.9 % IV BOLUS (SEPSIS)
1000.0000 mL | Freq: Once | INTRAVENOUS | Status: AC
  Administered 2013-08-06: 1000 mL via INTRAVENOUS

## 2013-08-06 MED ORDER — IOHEXOL 300 MG/ML  SOLN
50.0000 mL | Freq: Once | INTRAMUSCULAR | Status: AC | PRN
  Administered 2013-08-06: 50 mL via ORAL

## 2013-08-06 NOTE — Progress Notes (Signed)
Utilization Review completed.  Arlina Sabina RN CM  

## 2013-08-06 NOTE — ED Notes (Addendum)
Pt reports having 10/10 centralized abdominal pain that had a sudden onset two hours ago. Pt reports nausea, emesis, diaphoresis, and abdominal distention, however denies diarrhea. Pt states he had a perforated bowel in January of 2014. Pt is A/O x4.

## 2013-08-06 NOTE — H&P (Signed)
[name] [name] is an 79 y.o. male.   Chief Complaint: abd pain, nausea, vomiting HPI: this is an 79 year old male who presented to the ER at night with diffuse abdominal pain. He states that this started approximately 2:00 this afternoon with an episode of nausea and vomiting. He has noticed some bloating after meals for approximately 2 weeks. He is otherwise healthy with no significant chronic past medical history.  He has had a laparoscopic cholecystectomy and open hiatal hernia repair in the distant past. He has also underwent bilateral inguinal hernia repairs. Last December he had an episode of mild diverticulitis, for which he was hospitalized here for approximately a week. He then had new abdominal pain in January. He was seen by his primary care physician, where a CT was ordered and showed perforation of the small bowel. He underwent an emergent surgery with [place] bowel [place].  History reviewed. No pertinent past medical history.  Past Surgical History  Procedure Laterality Date  . Cholecystectomy    . Inguinal hernia repair    small bowel resection  hiatal hernia repair  No family history on file. Social History:  reports that he has never smoked. He has never used smokeless tobacco. He reports that he does not drink alcohol. His drug history is not on file.  Allergies: No Known Allergies   (Not in a hospital admission)  Results for orders placed during the hospital encounter of 08/06/13 (from the past 48 hour(s))  CBC WITH DIFFERENTIAL     Status: None   Collection Time    08/06/13  2:30 PM      Result Value Range   WBC 8.3  4.0 - 10.5 K/uL   RBC 5.31  4.22 - 5.81 MIL/uL   Hemoglobin 15.3  13.0 - 17.0 g/dL   HCT 95.6  21.3 - 08.6 %   MCV 87.4  78.0 - 100.0 fL   MCH 28.8  26.0 - 34.0 pg   MCHC 33.0  30.0 - 36.0 g/dL   RDW 57.8  46.9 - 62.9 %   Platelets 161  150 - 400 K/uL   Neutrophils Relative % 66  43 - 77 %   Neutro Abs 5.5  1.7 - 7.7 K/uL   Lymphocytes Relative 25  12 - 46 %   Lymphs Abs 2.0  0.7 - 4.0 K/uL   Monocytes Relative 7  3 - 12 %   Monocytes Absolute 0.6  0.1 - 1.0 K/uL   Eosinophils Relative 3  0 - 5 %   Eosinophils Absolute 0.2  0.0 - 0.7 K/uL   Basophils Relative 0  0 - 1 %   Basophils Absolute 0.0  0.0 - 0.1 K/uL  COMPREHENSIVE METABOLIC PANEL     Status: Abnormal   Collection Time    08/06/13  2:30 PM      Result Value Range   Sodium 141  135 - 145 mEq/L   Potassium 4.2  3.5 - 5.1 mEq/L   Chloride 103  96 - 112 mEq/L   CO2 26  19 - 32 mEq/L   Glucose, Bld 150 (*) 70 - 99 mg/dL   BUN 16  6 - 23 mg/dL   Creatinine, Ser 5.28  0.50 - 1.35 mg/dL   Calcium 9.6  8.4 - 41.3 mg/dL   Total Protein 7.8  6.0 - 8.3 g/dL   Albumin 3.9  3.5 - 5.2 g/dL   AST 23  0 - 37 U/L   ALT 16  0 - 53 U/L   Alkaline Phosphatase 122 (*) 39 - 117 U/L   Total Bilirubin 0.3  0.3 - 1.2 mg/dL   GFR calc non Af Amer 53 (*) >90 mL/min   GFR calc Af Amer 61 (*) >90 mL/min   Comment: (NOTE)     The eGFR has been calculated using the CKD EPI equation.     This calculation has not been validated in all clinical situations.     eGFR's persistently <90 mL/min signify possible Chronic Kidney     Disease.  LIPASE, BLOOD     Status: None   Collection Time    08/06/13  2:48 PM      Result Value Range   Lipase 40  11 - 59 U/L  TROPONIN I     Status: None   Collection Time    08/06/13  2:48 PM      Result Value Range   Troponin I <0.30  <0.30 ng/mL   Comment:            Due to the release kinetics of cTnI,     a negative result within the first hours     of the onset of symptoms does not rule out     myocardial infarction with certainty.     If myocardial infarction is still suspected,     repeat the test at appropriate intervals.  POCT I-STAT TROPONIN I     Status: None   Collection Time    08/06/13  2:48 PM      Result Value Range   Troponin i, poc 0.02  0.00 - 0.08 ng/mL   Comment 3            Comment: Due to the release  kinetics of cTnI,     a negative result within the first hours     of the onset of symptoms does not rule out     myocardial infarction with certainty.     If myocardial infarction is still suspected,     repeat the test at appropriate intervals.  ABO/RH     Status: None   Collection Time    08/06/13  2:55 PM      Result Value Range   ABO/RH(D) O POS    TYPE AND SCREEN     Status: None   Collection Time    08/06/13  3:10 PM      Result Value Range   ABO/RH(D) O POS     Antibody Screen NEG     Sample Expiration 08/09/2013    CG4 I-STAT (LACTIC ACID)     Status: Abnormal   Collection Time    08/06/13  3:14 PM      Result Value Range   Lactic Acid, Venous 4.33 (*) 0.5 - 2.2 mmol/L  URINALYSIS, ROUTINE W REFLEX MICROSCOPIC     Status: Abnormal   Collection Time    08/06/13  4:34 PM      Result Value Range   Color, Urine YELLOW  YELLOW   APPearance CLEAR  CLEAR   Specific Gravity, Urine 1.016  1.005 - 1.030   pH 5.0  5.0 - 8.0   Glucose, UA NEGATIVE  NEGATIVE mg/dL   Hgb urine dipstick NEGATIVE  NEGATIVE   Bilirubin Urine NEGATIVE  NEGATIVE   Ketones, ur NEGATIVE  NEGATIVE mg/dL   Protein, ur NEGATIVE  NEGATIVE mg/dL   Urobilinogen, UA 0.2  0.0 - 1.0 mg/dL   Nitrite NEGATIVE  NEGATIVE  Leukocytes, UA TRACE (*) NEGATIVE  URINE MICROSCOPIC-ADD ON     Status: Abnormal   Collection Time    08/06/13  4:34 PM      Result Value Range   Squamous Epithelial / LPF RARE  RARE   WBC, UA 3-6  <3 WBC/hpf   Bacteria, UA RARE  RARE   Casts HYALINE CASTS (*) NEGATIVE   Ct Abdomen Pelvis W Contrast  08/06/2013   CLINICAL DATA:  Right flank pain, dysuria.  EXAM: CT ABDOMEN AND PELVIS WITH CONTRAST  TECHNIQUE: Multidetector CT imaging of the abdomen and pelvis was performed using the standard protocol following bolus administration of intravenous contrast.  CONTRAST:  80mL OMNIPAQUE IOHEXOL 300 MG/ML  SOLN  COMPARISON:  12/09/2012  FINDINGS: Dependent atelectasis in the visualized lung  bases.  Coronary calcifications. Surgical clips in the gallbladder fossa. Stable prominence of the common duct without significant intrahepatic biliary ductal dilatation. Unremarkable spleen, adrenal glands, pancreas, right kidney. There is mild left lower pole renal parenchymal atrophy with a 6 mm calculus in the lower pole. No hydronephrosis. Moderate aortoiliac atheromatous plaque. Stomach physiologically distended. Multiple distended small bowel loops with poor progression of the oral contrast material beyond the proximal small bowel. Anastomotic staple line is noted in the mid jejunum. There is no definite transition point however to any distal decompressed small bowel. The colon is nondilated, with multiple distal descending and sigmoid diverticula. No definite abscess. Urinary bladder is physiologically distended. Mild enlargement of prostate. No ascites. No free air. Interval increase in number of visible central mesentery lymph nodes, some enlarged up to 1 cm short axis diameter. There is mild edema in the lower mesentery. Degenerate disc disease L4-5.  IMPRESSION: 1. Dilated small bowel with poor progression of oral contrast material, no definite transition point or etiology of obstruction. 2. Progressive central mesenteric adenopathy with some mild mesenteric edema. 3. Left nephrolithiasis without hydronephrosis.   Electronically Signed   By: Oley Balm M.D.   On: 08/06/2013 17:49   US Aorta  08/06/2013   *RADIOLOGY REPORT*  Clinical Data:  Abdominal pain.  ULTRASOUND OF ABDOMINAL AORTA  Technique:  Ultrasound examination of the abdominal aorta was performed to evaluate for abdominal aortic aneurysm.  Comparison: CT scan dated 12/09/2012  Abdominal Aorta:  No aneurysm identified.        Maximum AP diameter:  2.5 cm.       Maximum TRV diameter:  2.8 cm.  IMPRESSION: No abdominal aortic aneurysm identified.Atherosclerotic irregularity.   Original Report Authenticated By: Francene Boyers, M.D.   Dg  Abd Acute W/chest  08/06/2013   CLINICAL DATA:  Upper abdominal pain, nausea, vomiting.  EXAM: ACUTE ABDOMEN SERIES (ABDOMEN 2 VIEW & CHEST 1 VIEW)  COMPARISON:  CT 12/09/2012.  FINDINGS: Heart is borderline in size. Mild interstitial prominence throughout the lungs, likely chronic interstitial lung disease. No confluent opacities or effusions.  Mild gaseous distension of bowel throughout the abdomen and pelvis. This may reflect mild ileus. No evidence of bowel obstruction or free air. Prior cholecystectomy. No organomegaly. No suspicious calcification.  IMPRESSION: Mild diffuse gaseous distention of bowel, question mild ileus. No evidence of bowel obstruction or free air.  Diffuse interstitial prominence throughout the lungs, likely chronic interstitial lung disease.   Electronically Signed   By: Charlett Nose M.D.   On: 08/06/2013 16:10    Review of Systems  Constitutional: Negative for fever and chills.  Eyes: Negative for blurred vision.  Respiratory: Negative for cough and shortness of  breath.   Cardiovascular: Negative for chest pain and palpitations.  Gastrointestinal: Positive for nausea, vomiting and abdominal pain. Negative for heartburn, diarrhea, constipation and blood in stool.  Genitourinary: Negative for dysuria, urgency and frequency.  Musculoskeletal: Negative for myalgias.  Skin: Negative for itching.  Neurological: Negative for dizziness and headaches.    Blood pressure 178/79, pulse 58, temperature 98.2 F (36.8 C), temperature source Oral, resp. rate 18, SpO2 97.00%. Physical Exam  Constitutional: He is oriented to person, place, and time. He appears well-developed and well-nourished.  HENT:  Head: Normocephalic and atraumatic.  Eyes: Pupils are equal, round, and reactive to light.  Neck: Normal range of motion.  Cardiovascular: Normal rate and regular rhythm.   Respiratory: Effort normal and breath sounds normal.  GI: Soft. He exhibits distension. There is no  tenderness. There is no rebound and no guarding.  Musculoskeletal: Normal range of motion.  Neurological: He is alert and oriented to person, place, and time.  Skin: Skin is warm and dry.     Assessment/Plan Kaeo Jacome is an 79 year old male who presents to the emergency department with increasing diffuse abdominal pain and nausea and vomiting. A CT scan has revealed concern for small bowel obstruction. An NG had been placed and 7 mL of fluid has been removed so far. His lab work appears normal except for mildly elevated lactate. His vitals have also been normal. I would like to admit him to a telemetry monitoring floor.  I will start him on some IV fluids and recheck his lactate later tonight to make sure it is trending in the right direction. We will also recheck his labs in the morning. I will also get an abdominal x-ray tomorrow morning to evaluate for resolution/progression of small bowel obstruction.  Chayne Baumgart C. 08/06/2013, 7:06 PM

## 2013-08-06 NOTE — Progress Notes (Signed)
pcp is dr Delton See [place] in [place]

## 2013-08-06 NOTE — ED Provider Notes (Signed)
CSN: [id number]     Arrival date & time 08/06/13  1357 History   First MD Initiated Contact with Patient 08/06/13 1455     Chief Complaint  Patient presents with  . Abdominal Pain   (Consider location/radiation/quality/duration/timing/severity/associated sxs/prior Treatment) HPI Comments: Patient presents with sudden onset of midline abdominal pain. 3 Hours ago. Has been constant. Associated with one episode of vomiting the pain somewhat better. His history of perforated small intestine last year. States this is not feel the same. Denies any fevers, chills, testicular pain or urinary symptoms. No chest pain or shortness of breath. Denies any change in bowel habits. No blood in the stools. Pain is not radiating range central. He feels he is more distended than usual.  The history is provided by the patient.    History reviewed. No pertinent past medical history. Past Surgical History  Procedure Laterality Date  . Cholecystectomy    . Inguinal hernia repair     No family history on file. History  Substance Use Topics  . Smoking status: Never Smoker   . Smokeless tobacco: Never Used  . Alcohol Use: No    Review of Systems  Constitutional: Positive for activity change and appetite change. Negative for fever.  HENT: Negative for congestion and rhinorrhea.   Eyes: Negative for visual disturbance.  Respiratory: Negative for cough, chest tightness and shortness of breath.   Cardiovascular: Negative for chest pain.  Gastrointestinal: Positive for nausea, vomiting and abdominal pain. Negative for diarrhea.  Genitourinary: Negative for dysuria and hematuria.  Musculoskeletal: Negative for back pain and arthralgias.  Skin: Negative for rash.  Neurological: Negative for dizziness, weakness and headaches.  A complete 10 system review of systems was obtained and all systems are negative except as noted in the HPI and PMH.    Allergies  Review of patient's allergies indicates no known  allergies.  Home Medications   Current Outpatient Rx  Name  Route  Sig  Dispense  Refill  . Ascorbic Acid (VITAMIN C PO)   Oral   Take 1,000 mg by mouth daily.          Marland Kitchen CALCIUM-MAGNESIUM-ZINC PO   Oral   Take 1 tablet by mouth daily.         . carboxymethylcellulose (REFRESH PLUS) 0.5 % SOLN   Both Ears   Place 1 drop into both ears 3 (three) times daily as needed.         . Cholecalciferol (VITAMIN D PO)   Oral   Take 1 tablet by mouth daily.         Marland Kitchen CINNAMON PO   Oral   Take 1 tablet by mouth daily.         . Coenzyme Q10 (CO Q-10 PO)   Oral   Take 1 capsule by mouth daily.         . fish oil-omega-3 fatty acids 1000 MG capsule   Oral   Take 1 g by mouth daily.         . hydrocortisone cream 1 %   Topical   Apply 1 application topically 2 (two) times daily as needed. Itching         . NIACIN PO   Oral   Take 1 tablet by mouth daily.         Marland Kitchen omeprazole (PRILOSEC) 20 MG capsule   Oral   Take 20 mg by mouth daily.         Marland Kitchen POTASSIUM PO  Oral   Take 1 tablet by mouth daily.         . Pyridoxine HCl (VITAMIN B-6 PO)   Oral   Take 1 tablet by mouth daily.         . Thiamine HCl (VITAMIN B-1 PO)   Oral   Take 1 tablet by mouth daily.         Marland Kitchen VITAMIN A PO   Oral   Take 1 tablet by mouth daily.         Marland Kitchen VITAMIN E PO   Oral   Take 1 tablet by mouth daily.         . Cyanocobalamin (VITAMIN B-12 PO)   Oral   Take 1 tablet by mouth daily.          BP 178/79  Pulse 58  Temp(Src) 98.2 F (36.8 C) (Oral)  Resp 18  SpO2 97% Physical Exam  Constitutional: He is oriented to person, place, and time. He appears well-developed and well-nourished. No distress.  HENT:  Head: Normocephalic and atraumatic.  Mouth/Throat: Oropharynx is clear and moist. No oropharyngeal exudate.  Eyes: Conjunctivae and EOM are normal. Pupils are equal, round, and reactive to light.  Neck: Normal range of motion.  Cardiovascular:  Normal rate, regular rhythm, normal heart sounds and intact distal pulses.   No murmur heard. +2 femoral DP, PT pulses  Pulmonary/Chest: Effort normal and breath sounds normal. No respiratory distress.  Abdominal: Soft. He exhibits distension. There is tenderness. There is no rebound and no guarding.  dfstended abdomen, tympanitic No peritoneal signs  Musculoskeletal: Normal range of motion. He exhibits no edema and no tenderness.  Neurological: He is alert and oriented to person, place, and time. No cranial nerve deficit. He exhibits normal muscle tone. Coordination normal.    ED Course  Procedures (including critical care time) Labs Review Labs Reviewed  COMPREHENSIVE METABOLIC PANEL - Abnormal; Notable for the following:    Glucose, Bld 150 (*)    Alkaline Phosphatase 122 (*)    GFR calc non Af Amer 53 (*)    GFR calc Af Amer 61 (*)    All other components within normal limits  URINALYSIS, ROUTINE W REFLEX MICROSCOPIC - Abnormal; Notable for the following:    Leukocytes, UA TRACE (*)    All other components within normal limits  URINE MICROSCOPIC-ADD ON - Abnormal; Notable for the following:    Casts HYALINE CASTS (*)    All other components within normal limits  CG4 I-STAT (LACTIC ACID) - Abnormal; Notable for the following:    Lactic Acid, Venous 4.33 (*)    All other components within normal limits  CBC WITH DIFFERENTIAL  LIPASE, BLOOD  TROPONIN I  POCT I-STAT TROPONIN I  TYPE AND SCREEN  ABO/RH   Imaging Review Ct Abdomen Pelvis W Contrast  08/06/2013   CLINICAL DATA:  Right flank pain, dysuria.  EXAM: CT ABDOMEN AND PELVIS WITH CONTRAST  TECHNIQUE: Multidetector CT imaging of the abdomen and pelvis was performed using the standard protocol following bolus administration of intravenous contrast.  CONTRAST:  80mL OMNIPAQUE IOHEXOL 300 MG/ML  SOLN  COMPARISON:  12/09/2012  FINDINGS: Dependent atelectasis in the visualized lung bases.  Coronary calcifications. Surgical  clips in the gallbladder fossa. Stable prominence of the common duct without significant intrahepatic biliary ductal dilatation. Unremarkable spleen, adrenal glands, pancreas, right kidney. There is mild left lower pole renal parenchymal atrophy with a 6 mm calculus in the lower pole. No hydronephrosis. Moderate  aortoiliac atheromatous plaque. Stomach physiologically distended. Multiple distended small bowel loops with poor progression of the oral contrast material beyond the proximal small bowel. Anastomotic staple line is noted in the mid jejunum. There is no definite transition point however to any distal decompressed small bowel. The colon is nondilated, with multiple distal descending and sigmoid diverticula. No definite abscess. Urinary bladder is physiologically distended. Mild enlargement of prostate. No ascites. No free air. Interval increase in number of visible central mesentery lymph nodes, some enlarged up to 1 cm short axis diameter. There is mild edema in the lower mesentery. Degenerate disc disease L4-5.  IMPRESSION: 1. Dilated small bowel with poor progression of oral contrast material, no definite transition point or etiology of obstruction. 2. Progressive central mesenteric adenopathy with some mild mesenteric edema. 3. Left nephrolithiasis without hydronephrosis.   Electronically Signed   By: Oley Balm M.D.   On: 08/06/2013 17:49   US Aorta  08/06/2013   *RADIOLOGY REPORT*  Clinical Data:  Abdominal pain.  ULTRASOUND OF ABDOMINAL AORTA  Technique:  Ultrasound examination of the abdominal aorta was performed to evaluate for abdominal aortic aneurysm.  Comparison: CT scan dated 12/09/2012  Abdominal Aorta:  No aneurysm identified.        Maximum AP diameter:  2.5 cm.       Maximum TRV diameter:  2.8 cm.  IMPRESSION: No abdominal aortic aneurysm identified.Atherosclerotic irregularity.   Original Report Authenticated By: Francene Boyers, M.D.   Dg Abd Acute W/chest  08/06/2013   CLINICAL  DATA:  Upper abdominal pain, nausea, vomiting.  EXAM: ACUTE ABDOMEN SERIES (ABDOMEN 2 VIEW & CHEST 1 VIEW)  COMPARISON:  CT 12/09/2012.  FINDINGS: Heart is borderline in size. Mild interstitial prominence throughout the lungs, likely chronic interstitial lung disease. No confluent opacities or effusions.  Mild gaseous distension of bowel throughout the abdomen and pelvis. This may reflect mild ileus. No evidence of bowel obstruction or free air. Prior cholecystectomy. No organomegaly. No suspicious calcification.  IMPRESSION: Mild diffuse gaseous distention of bowel, question mild ileus. No evidence of bowel obstruction or free air.  Diffuse interstitial prominence throughout the lungs, likely chronic interstitial lung disease.   Electronically Signed   By: Charlett Nose M.D.   On: 08/06/2013 16:10    MDM   1. Bowel obstruction    Abdominal pain that was sudden onset, distended without peritoneal signs.   AAS shows ileus.  Lactate 4.4  Intact distal pulses. US shows no AAA.  CT scan shows dilated small bowel without evidence transition point. We'll place an NG tube for apparent small bowel obstruction. In setting of elevated a lactate and mesenteric edema, mesenteric ischemia is considered but seems less likely given small bowel obstruction findings.  Discussed with Dr. Maisie Fus of surgery. NG tube was placed and 700 cc of stomach contents was removed. Vitals remained stable throughout ED course.     Glynn Octave, MD 08/06/13 [phone]

## 2013-08-06 NOTE — Progress Notes (Deleted)
Pt confirms pcp is Dr Lenise Herald in [place]  EPIC updated

## 2013-08-07 ENCOUNTER — Inpatient Hospital Stay (HOSPITAL_COMMUNITY): Payer: Medicare Other

## 2013-08-07 LAB — CBC
HCT: 38.4 % — ABNORMAL LOW (ref 39.0–52.0)
Hemoglobin: 12.4 g/dL — ABNORMAL LOW (ref 13.0–17.0)
MCH: 28.2 pg (ref 26.0–34.0)
MCHC: 32.3 g/dL (ref 30.0–36.0)
MCV: 87.5 fL (ref 78.0–100.0)
Platelets: 147 10*3/uL — ABNORMAL LOW (ref 150–400)
RBC: 4.39 MIL/uL (ref 4.22–5.81)
RDW: 14.6 % (ref 11.5–15.5)
WBC: 8.8 10*3/uL (ref 4.0–10.5)

## 2013-08-07 LAB — BASIC METABOLIC PANEL
BUN: 16 mg/dL (ref 6–23)
CO2: 25 mEq/L (ref 19–32)
Calcium: 8.2 mg/dL — ABNORMAL LOW (ref 8.4–10.5)
Chloride: 105 mEq/L (ref 96–112)
Creatinine, Ser: 1.25 mg/dL (ref 0.50–1.35)
GFR calc Af Amer: 61 mL/min — ABNORMAL LOW (ref >90)
GFR calc non Af Amer: 53 mL/min — ABNORMAL LOW (ref >90)
Glucose, Bld: 102 mg/dL — ABNORMAL HIGH (ref 70–99)
Potassium: 3.8 mEq/L (ref 3.5–5.1)
Sodium: 139 mEq/L (ref 135–145)

## 2013-08-07 LAB — LACTIC ACID, PLASMA: Lactic Acid, Venous: 0.8 mmol/L (ref 0.5–2.2)

## 2013-08-07 IMAGING — CR DG ABD PORTABLE 2V
1 series · 2 of 2 positions shown · non-contrast
Comparison: [DATE] CT

CLINICAL DATA: NG tube placement

PORTABLE ABDOMEN - 2 VIEW

[Series 1: AP · U · 2 of 2 slices shown]
[im 1/2]
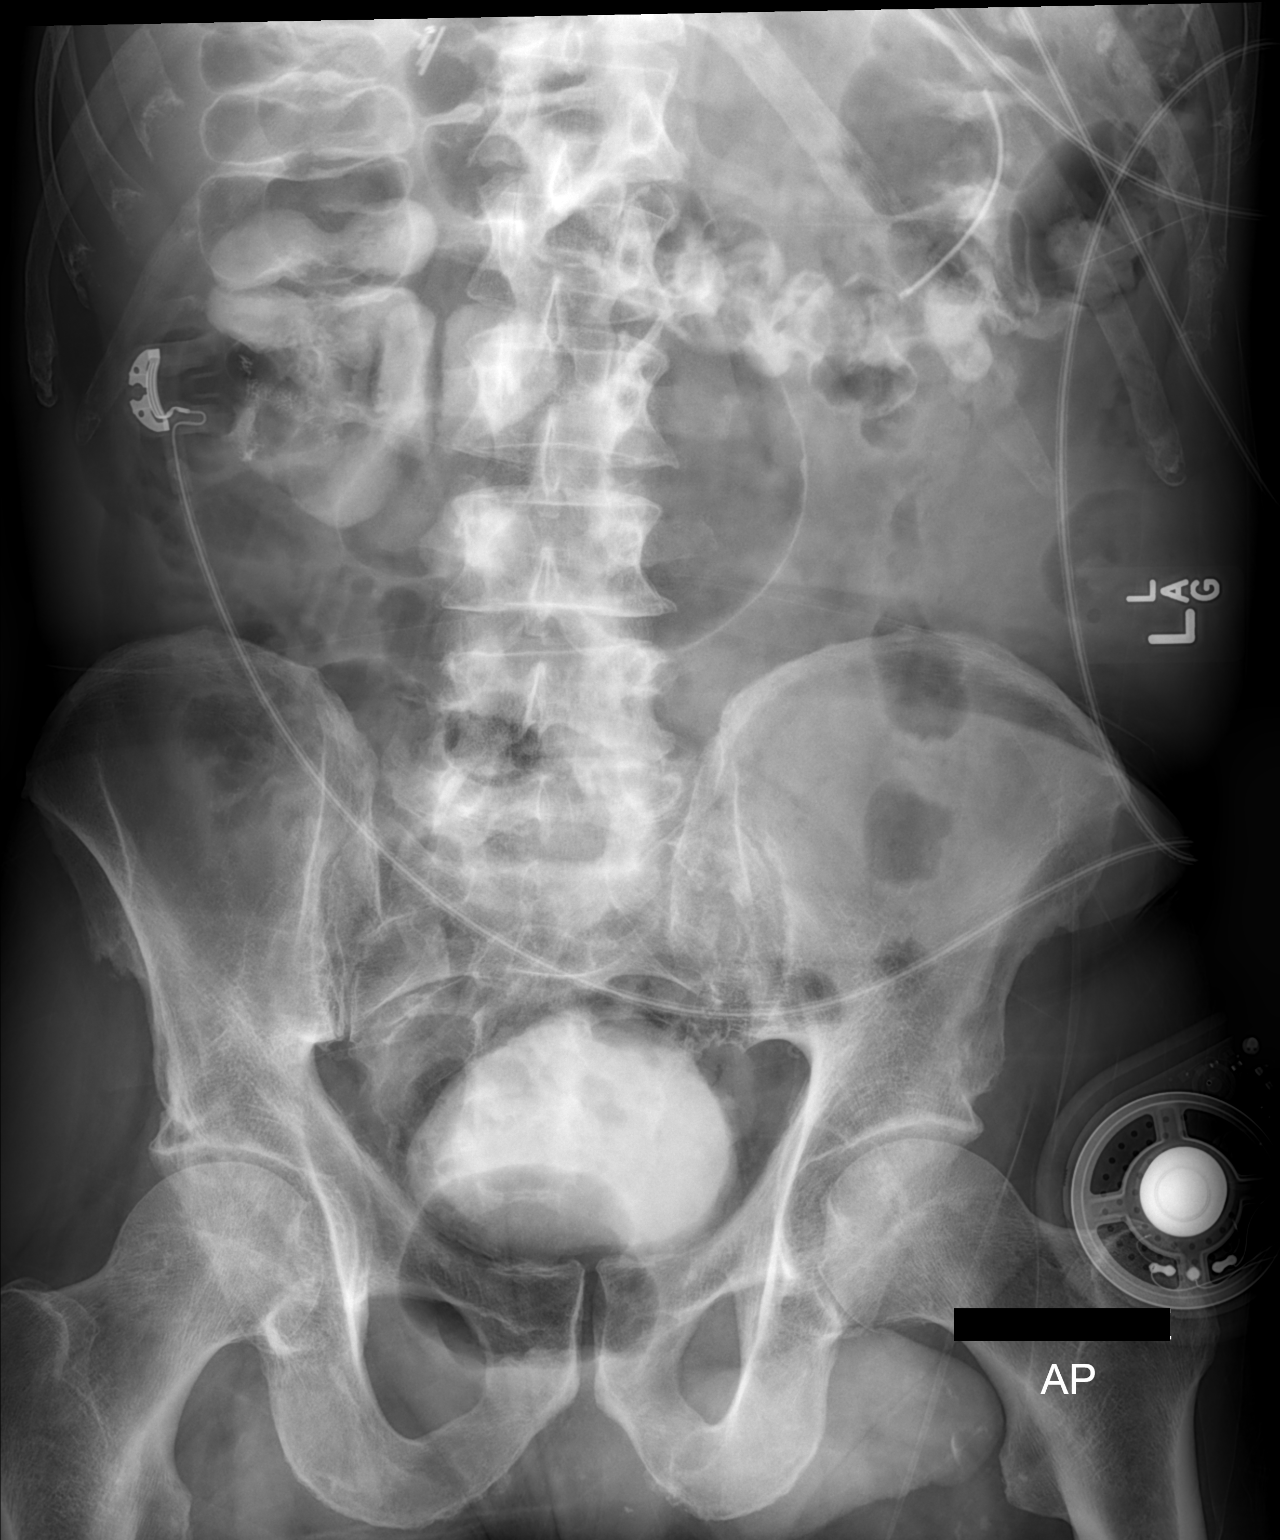
[im 2/2]
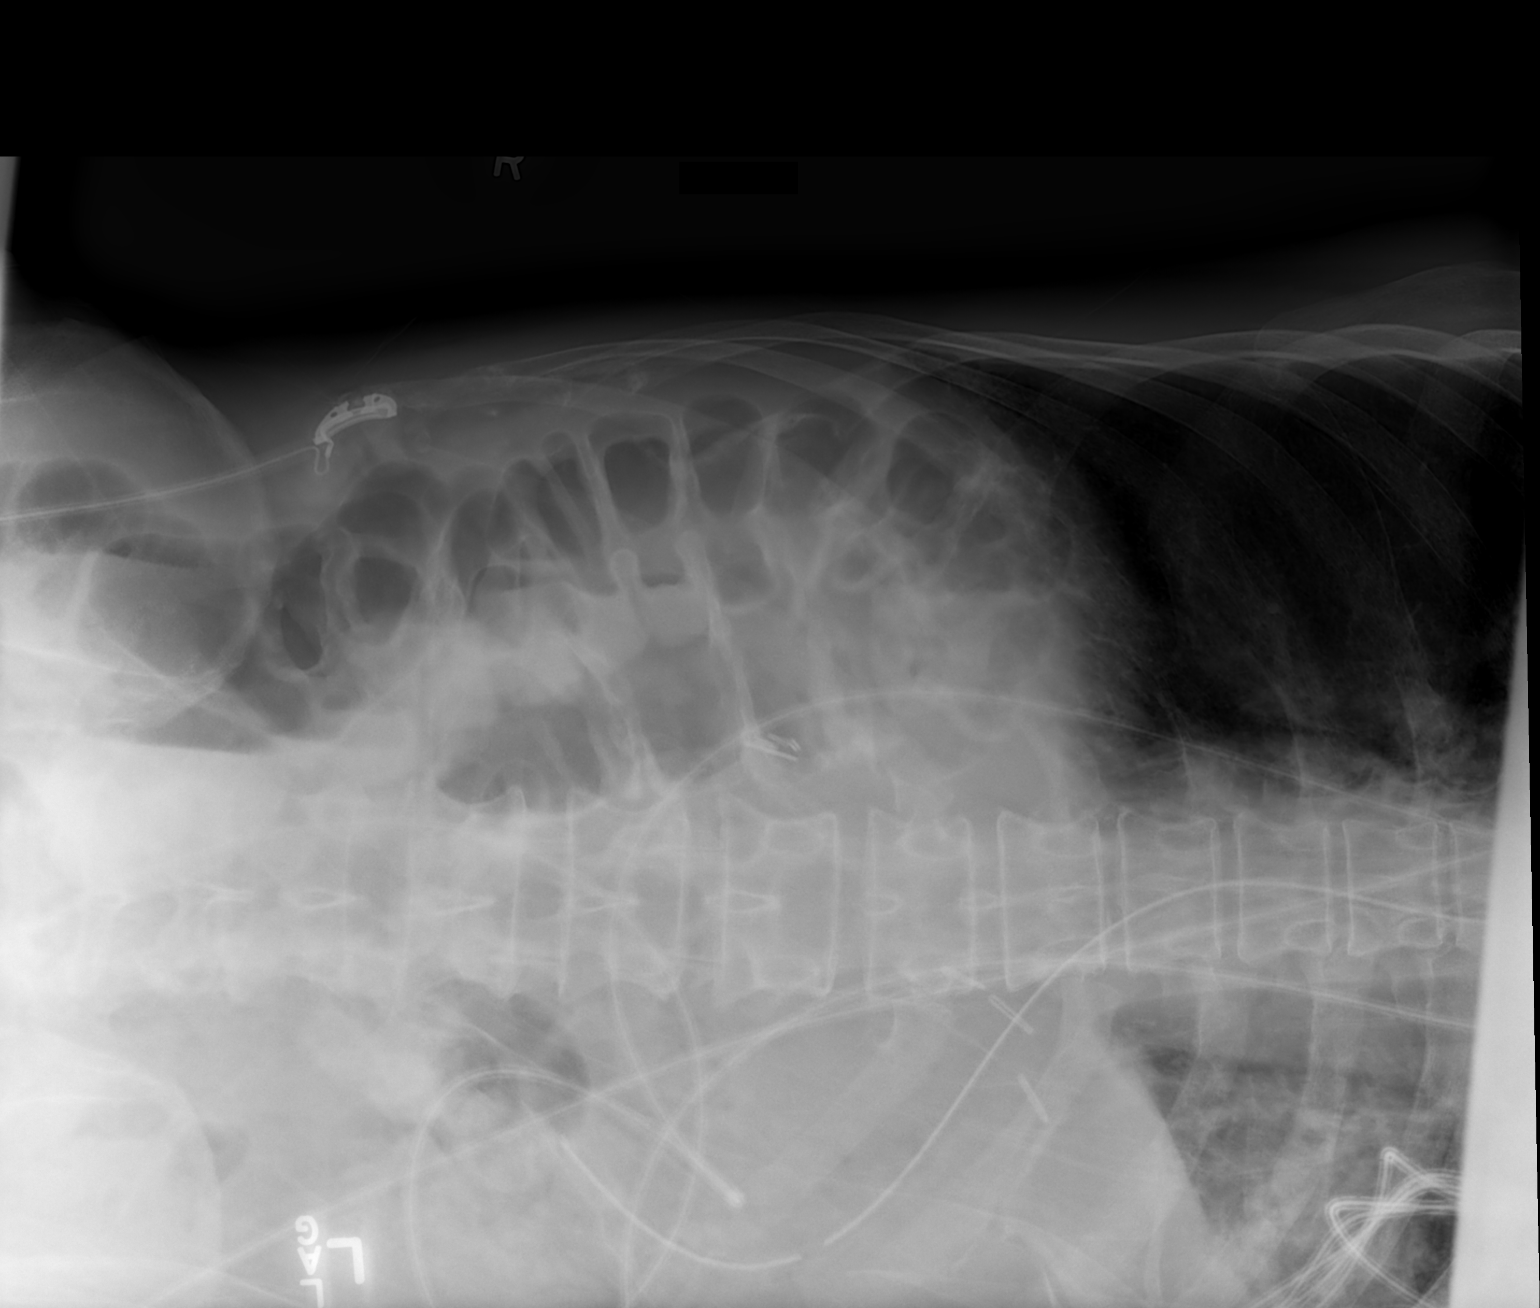

[2 of 2 positions shown; findings below may reference images not displayed]

FINDINGS: Contrast material is noted within nondilated colon as
well as residual contrast within the bladder.  Nasogastric tube
terminates over the expected location of the decompressed stomach.
A loop of bowel over the mid abdomen could represent a mildly
dilated small bowel but accurate measurement cannot be obtained due
to overlying structures.  Overall, there is less gaseous small
bowel distention than on prior exam.  No free air.  No air fluid
level.
IMPRESSION: Interval distal passage of contrast and nondilated colon and
decreased gaseous distention of small bowel, suggesting improvement
in small bowel obstruction or ileus.

## 2013-08-07 MED ORDER — PHENOL 1.4 % MT LIQD
1.0000 | OROMUCOSAL | Status: DC | PRN
  Administered 2013-08-08: 1 via OROMUCOSAL
  Filled 2013-08-07: qty 177

## 2013-08-07 MED ORDER — CEPASTAT 14.5 MG MT LOZG
1.0000 | LOZENGE | OROMUCOSAL | Status: DC | PRN
  Administered 2013-08-08: 1 via BUCCAL
  Filled 2013-08-07 (×2): qty 18

## 2013-08-07 NOTE — Progress Notes (Signed)
Subjective: Feels better he has had a little flatus.  Less abdominal distension.  Objective: Vital signs in last 24 hours: Temp:  [97 F (36.1 C)-98.2 F (36.8 C)] 97 F (36.1 C) (09/18 0428) Pulse Rate:  [51-58] 51 (09/18 0428) Resp:  [16-20] 16 (09/18 0428) BP: (108-178)/(56-79) 108/56 mmHg (09/18 0428) SpO2:  [94 %-98 %] 98 % (09/18 0428) Weight:  [67.631 kg (149 lb 1.6 oz)] 67.631 kg (149 lb 1.6 oz) (09/17 2100) Last BM Date: 08/06/13 1150 ml from NG recorded. NPO Afebrile VSS Labs OK, H/H down some with hydration Film show some contrast is in the colon now.  Intake/Output from previous day: 09/17 0701 - 09/18 0700 In: -  Out: 1420 [Urine:270; Emesis/NG output:1150] Intake/Output this shift: Total I/O In: -  Out: 300 [Urine:300]  General appearance: alert, cooperative and no distress GI: soft, mildly distended, few BS, he is not tender.  Lab Results:   Recent Labs  08/06/13 1430 08/07/13 0445  WBC 8.3 8.8  HGB 15.3 12.4*  HCT 46.4 38.4*  PLT 161 147*    BMET  Recent Labs  08/06/13 1430 08/07/13 0445  NA 141 139  K 4.2 3.8  CL 103 105  CO2 26 25  GLUCOSE 150* 102*  BUN 16 16  CREATININE 1.25 1.25  CALCIUM 9.6 8.2*   PT/INR No results found for this basename: LABPROT, INR,  in the last 72 hours   Recent Labs Lab 08/06/13 1430  AST 23  ALT 16  ALKPHOS 122*  BILITOT 0.3  PROT 7.8  ALBUMIN 3.9     Lipase     Component Value Date/Time   LIPASE 40 08/06/2013 1448     Studies/Results: Ct Abdomen Pelvis W Contrast  08/06/2013   CLINICAL DATA:  Right flank pain, dysuria.  EXAM: CT ABDOMEN AND PELVIS WITH CONTRAST  TECHNIQUE: Multidetector CT imaging of the abdomen and pelvis was performed using the standard protocol following bolus administration of intravenous contrast.  CONTRAST:  80mL OMNIPAQUE IOHEXOL 300 MG/ML  SOLN  COMPARISON:  12/09/2012  FINDINGS: Dependent atelectasis in the visualized lung bases.  Coronary calcifications.  Surgical clips in the gallbladder fossa. Stable prominence of the common duct without significant intrahepatic biliary ductal dilatation. Unremarkable spleen, adrenal glands, pancreas, right kidney. There is mild left lower pole renal parenchymal atrophy with a 6 mm calculus in the lower pole. No hydronephrosis. Moderate aortoiliac atheromatous plaque. Stomach physiologically distended. Multiple distended small bowel loops with poor progression of the oral contrast material beyond the proximal small bowel. Anastomotic staple line is noted in the mid jejunum. There is no definite transition point however to any distal decompressed small bowel. The colon is nondilated, with multiple distal descending and sigmoid diverticula. No definite abscess. Urinary bladder is physiologically distended. Mild enlargement of prostate. No ascites. No free air. Interval increase in number of visible central mesentery lymph nodes, some enlarged up to 1 cm short axis diameter. There is mild edema in the lower mesentery. Degenerate disc disease L4-5.  IMPRESSION: 1. Dilated small bowel with poor progression of oral contrast material, no definite transition point or etiology of obstruction. 2. Progressive central mesenteric adenopathy with some mild mesenteric edema. 3. Left nephrolithiasis without hydronephrosis.   Electronically Signed   By: Oley Balm M.D.   On: 08/06/2013 17:49   US Aorta  08/06/2013   *RADIOLOGY REPORT*  Clinical Data:  Abdominal pain.  ULTRASOUND OF ABDOMINAL AORTA  Technique:  Ultrasound examination of the abdominal aorta was performed  to evaluate for abdominal aortic aneurysm.  Comparison: CT scan dated 12/09/2012  Abdominal Aorta:  No aneurysm identified.        Maximum AP diameter:  2.5 cm.       Maximum TRV diameter:  2.8 cm.  IMPRESSION: No abdominal aortic aneurysm identified.Atherosclerotic irregularity.   Original Report Authenticated By: Francene Boyers, M.D.   Dg Abd Acute W/chest  08/06/2013    CLINICAL DATA:  Upper abdominal pain, nausea, vomiting.  EXAM: ACUTE ABDOMEN SERIES (ABDOMEN 2 VIEW & CHEST 1 VIEW)  COMPARISON:  CT 12/09/2012.  FINDINGS: Heart is borderline in size. Mild interstitial prominence throughout the lungs, likely chronic interstitial lung disease. No confluent opacities or effusions.  Mild gaseous distension of bowel throughout the abdomen and pelvis. This may reflect mild ileus. No evidence of bowel obstruction or free air. Prior cholecystectomy. No organomegaly. No suspicious calcification.  IMPRESSION: Mild diffuse gaseous distention of bowel, question mild ileus. No evidence of bowel obstruction or free air.  Diffuse interstitial prominence throughout the lungs, likely chronic interstitial lung disease.   Electronically Signed   By: Charlett Nose M.D.   On: 08/06/2013 16:10   Dg Abd Portable 2v  08/07/2013   *RADIOLOGY REPORT*  Clinical Data: NG tube placement  PORTABLE ABDOMEN - 2 VIEW  Comparison: 08/06/2013 CT  Findings: Contrast material is noted within nondilated colon as well as residual contrast within the bladder.  Nasogastric tube terminates over the expected location of the decompressed stomach. A loop of bowel over the mid abdomen could represent a mildly dilated small bowel but accurate measurement cannot be obtained due to overlying structures.  Overall, there is less gaseous small bowel distention than on prior exam.  No free air.  No air fluid level.  IMPRESSION: Interval distal passage of contrast and nondilated colon and decreased gaseous distention of small bowel, suggesting improvement in small bowel obstruction or ileus.   Original Report Authenticated By: Christiana Pellant, M.D.    Medications: . heparin  5,000 Units Subcutaneous Q8H   . sodium chloride 100 mL/hr at 08/07/13 0981   Prior to Admission medications   Medication Sig Start Date End Date Taking? Authorizing Provider  Ascorbic Acid (VITAMIN C PO) Take 1,000 mg by mouth daily.    Yes Historical  Provider, MD  CALCIUM-MAGNESIUM-ZINC PO Take 1 tablet by mouth daily.   Yes Historical Provider, MD  carboxymethylcellulose (REFRESH PLUS) 0.5 % SOLN Place 1 drop into both ears 3 (three) times daily as needed.   Yes Historical Provider, MD  Cholecalciferol (VITAMIN D PO) Take 1 tablet by mouth daily.   Yes Historical Provider, MD  CINNAMON PO Take 1 tablet by mouth daily.   Yes Historical Provider, MD  Coenzyme Q10 (CO Q-10 PO) Take 1 capsule by mouth daily.   Yes Historical Provider, MD  fish oil-omega-3 fatty acids 1000 MG capsule Take 1 g by mouth daily.   Yes Historical Provider, MD  hydrocortisone cream 1 % Apply 1 application topically 2 (two) times daily as needed. Itching   Yes Historical Provider, MD  NIACIN PO Take 1 tablet by mouth daily.   Yes Historical Provider, MD  omeprazole (PRILOSEC) 20 MG capsule Take 20 mg by mouth daily.   Yes Historical Provider, MD  POTASSIUM PO Take 1 tablet by mouth daily.   Yes Historical Provider, MD  Pyridoxine HCl (VITAMIN B-6 PO) Take 1 tablet by mouth daily.   Yes Historical Provider, MD  Thiamine HCl (VITAMIN B-1 PO) Take 1  tablet by mouth daily.   Yes Historical Provider, MD  VITAMIN A PO Take 1 tablet by mouth daily.   Yes Historical Provider, MD  VITAMIN E PO Take 1 tablet by mouth daily.   Yes Historical Provider, MD  Cyanocobalamin (VITAMIN B-12 PO) Take 1 tablet by mouth daily.    Historical Provider, MD     Assessment/Plan SBO; s/p cholecystectomy, SB repair, Hiatal hernia repair and inguinal hernia repair Hospitalized 10/2012 with ARF/Dehydration/sepsis/acute diverticulitis/enteritis Colon resection Jan 2014 in [facility], for Diverticulosis episode in 10/2012.  Plan:  Continue NG for now, hydration and bowel rest.  Recheck labs in AM.    LOS: 1 day    Kimimila Tauzin 08/07/2013

## 2013-08-07 NOTE — Progress Notes (Signed)
INITIAL NUTRITION ASSESSMENT  DOCUMENTATION CODES Per approved criteria  -Not Applicable   INTERVENTION: Diet advancement per team. RD to follow along to assess need for further nutrition interventions.  NUTRITION DIAGNOSIS: Inadequate oral intake related to inability to eat as evidenced by NPO status.   Goal: Diet advancement as tolerated. Intake to meet at least 90% of estimated needs.  Monitor:  weight trends, lab trends, I/O's, diet advancement/PO intake  Reason for Assessment: Malnutrition Screening Tool  79 y.o. male  Admitting Dx: possible small bowel obstruction  ASSESSMENT: PMHx significant for cholecystectomy, inguinal hernia repair, mild diverticulitis, and small bowel resection. Admitted with abdominal pain, n/v x x 1 day; bloating after meals x 2 weeks. Work-up reveals possible small bowel obstruction.  NGT placed on admission. Pt with 1.4 liters of output yesterday, 300 ml so far this morning. Abdominal xray this morning suggests improvement in small bowel obstruction/ileus.  Patient reports that his weight is improving. He had surgery in January and dropped down to 130 lb, pt has since re-gained his weight. He doesn't follow any diet restrictions or have any food allergies. Pt was able to eat well even during his reported symptoms of bloating x 2 weeks post meals.  Pt denies any questions/concerns at this time re: nutrition.  Height: Ht Readings from Last 1 Encounters:  08/06/13 5\' 9"  (1.753 m)    Weight: Wt Readings from Last 1 Encounters:  08/06/13 149 lb 1.6 oz (67.631 kg)    Ideal Body Weight: 160 lb  % Ideal Body Weight: 93%  Wt Readings from Last 10 Encounters:  08/06/13 149 lb 1.6 oz (67.631 kg)  11/19/12 147 lb 0.8 oz (66.701 kg)    Usual Body Weight: 147 lb  % Usual Body Weight: 101%  BMI:  Body mass index is 22.01 kg/(m^2). WNL  Estimated Nutritional Needs: Kcal: 1450 - 1700 Protein: 67 - 80 g Fluid: 1.8 - 2 liters  Skin:  intact  Diet Order: NPO  EDUCATION NEEDS: -No education needs identified at this time   Intake/Output Summary (Last 24 hours) at 08/07/13 0837 Last data filed at 08/07/13 0754  Gross per 24 hour  Intake      0 ml  Output   1720 ml  Net  -1720 ml    Last BM: 9/17  Labs:   Recent Labs Lab 08/06/13 1430 08/07/13 0445  NA 141 139  K 4.2 3.8  CL 103 105  CO2 26 25  BUN 16 16  CREATININE 1.25 1.25  CALCIUM 9.6 8.2*  GLUCOSE 150* 102*    CBG (last 3)  No results found for this basename: GLUCAP,  in the last 72 hours  Scheduled Meds: . heparin  5,000 Units Subcutaneous Q8H    Continuous Infusions: . sodium chloride 100 mL/hr at 08/06/13 2237    History reviewed. No pertinent past medical history.  Past Surgical History  Procedure Laterality Date  . Cholecystectomy    . Inguinal hernia repair      Jarold Motto MS, RD, LDN Pager: [phone] After-hours pager: [phone]

## 2013-08-07 NOTE — Progress Notes (Signed)
Seen, examined, agree with above.    SBO seems to be clinically improving.

## 2013-08-08 LAB — BASIC METABOLIC PANEL
BUN: 18 mg/dL (ref 6–23)
CO2: 25 mEq/L (ref 19–32)
Calcium: 8.7 mg/dL (ref 8.4–10.5)
Chloride: 106 mEq/L (ref 96–112)
Creatinine, Ser: 1.31 mg/dL (ref 0.50–1.35)
GFR calc Af Amer: 58 mL/min — ABNORMAL LOW (ref >90)
GFR calc non Af Amer: 50 mL/min — ABNORMAL LOW (ref >90)
Glucose, Bld: 70 mg/dL (ref 70–99)
Potassium: 3.6 mEq/L (ref 3.5–5.1)
Sodium: 142 mEq/L (ref 135–145)

## 2013-08-08 LAB — CBC
HCT: 41.2 % (ref 39.0–52.0)
Hemoglobin: 13.5 g/dL (ref 13.0–17.0)
MCH: 28.9 pg (ref 26.0–34.0)
MCHC: 32.8 g/dL (ref 30.0–36.0)
MCV: 88.2 fL (ref 78.0–100.0)
Platelets: 147 10*3/uL — ABNORMAL LOW (ref 150–400)
RBC: 4.67 MIL/uL (ref 4.22–5.81)
RDW: 14.7 % (ref 11.5–15.5)
WBC: 8.3 10*3/uL (ref 4.0–10.5)

## 2013-08-08 MED ORDER — POTASSIUM CHLORIDE IN NACL 40-0.9 MEQ/L-% IV SOLN
INTRAVENOUS | Status: DC
  Administered 2013-08-08 – 2013-08-09 (×3): via INTRAVENOUS
  Administered 2013-08-09: 20 mL/h via INTRAVENOUS
  Filled 2013-08-08 (×6): qty 1000

## 2013-08-08 MED ORDER — PANTOPRAZOLE SODIUM 40 MG IV SOLR
40.0000 mg | Freq: Every day | INTRAVENOUS | Status: DC
  Administered 2013-08-08 – 2013-08-11 (×4): 40 mg via INTRAVENOUS
  Filled 2013-08-08 (×6): qty 40

## 2013-08-08 NOTE — Progress Notes (Signed)
Seen,examined.  Start clears.   Hope to d/c NGT and advance diet more tomorrow.

## 2013-08-08 NOTE — Progress Notes (Signed)
Subjective: He feels better, he says he's hungry, does not feel distended  Objective: Vital signs in last 24 hours: Temp:  [97.6 F (36.4 C)-97.8 F (36.6 C)] 97.8 F (36.6 C) (09/19 0700) Pulse Rate:  [53-68] 68 (09/19 0700) Resp:  [18-20] 20 (09/19 0700) BP: (109-142)/(55-89) 142/62 mmHg (09/19 0700) SpO2:  [90 %-97 %] 93 % (09/19 0700) Last BM Date: 08/06/13 2450 from NG yesterday Afebrile, VSS Labs OK No film this Am Intake/Output from previous day: 09/18 0701 - 09/19 0700 In: 3238.3 [I.V.:3238.3] Out: 3650 [Urine:1200; Emesis/NG output:2450] Intake/Output this shift:    General appearance: alert, cooperative, no distress and aside from the NG tube Resp: clear to auscultation bilaterally GI: soft, not tender, +BS,  Lab Results:   Recent Labs  08/07/13 0445 08/08/13 0400  WBC 8.8 8.3  HGB 12.4* 13.5  HCT 38.4* 41.2  PLT 147* 147*    BMET  Recent Labs  08/07/13 0445 08/08/13 0400  NA 139 142  K 3.8 3.6  CL 105 106  CO2 25 25  GLUCOSE 102* 70  BUN 16 18  CREATININE 1.25 1.31  CALCIUM 8.2* 8.7   PT/INR No results found for this basename: LABPROT, INR,  in the last 72 hours   Recent Labs Lab 08/06/13 1430  AST 23  ALT 16  ALKPHOS 122*  BILITOT 0.3  PROT 7.8  ALBUMIN 3.9     Lipase     Component Value Date/Time   LIPASE 40 08/06/2013 1448     Studies/Results: Ct Abdomen Pelvis W Contrast  08/06/2013   CLINICAL DATA:  Right flank pain, dysuria.  EXAM: CT ABDOMEN AND PELVIS WITH CONTRAST  TECHNIQUE: Multidetector CT imaging of the abdomen and pelvis was performed using the standard protocol following bolus administration of intravenous contrast.  CONTRAST:  80mL OMNIPAQUE IOHEXOL 300 MG/ML  SOLN  COMPARISON:  12/09/2012  FINDINGS: Dependent atelectasis in the visualized lung bases.  Coronary calcifications. Surgical clips in the gallbladder fossa. Stable prominence of the common duct without significant intrahepatic biliary ductal  dilatation. Unremarkable spleen, adrenal glands, pancreas, right kidney. There is mild left lower pole renal parenchymal atrophy with a 6 mm calculus in the lower pole. No hydronephrosis. Moderate aortoiliac atheromatous plaque. Stomach physiologically distended. Multiple distended small bowel loops with poor progression of the oral contrast material beyond the proximal small bowel. Anastomotic staple line is noted in the mid jejunum. There is no definite transition point however to any distal decompressed small bowel. The colon is nondilated, with multiple distal descending and sigmoid diverticula. No definite abscess. Urinary bladder is physiologically distended. Mild enlargement of prostate. No ascites. No free air. Interval increase in number of visible central mesentery lymph nodes, some enlarged up to 1 cm short axis diameter. There is mild edema in the lower mesentery. Degenerate disc disease L4-5.  IMPRESSION: 1. Dilated small bowel with poor progression of oral contrast material, no definite transition point or etiology of obstruction. 2. Progressive central mesenteric adenopathy with some mild mesenteric edema. 3. Left nephrolithiasis without hydronephrosis.   Electronically Signed   By: Oley Balm M.D.   On: 08/06/2013 17:49   US Aorta  08/06/2013   *RADIOLOGY REPORT*  Clinical Data:  Abdominal pain.  ULTRASOUND OF ABDOMINAL AORTA  Technique:  Ultrasound examination of the abdominal aorta was performed to evaluate for abdominal aortic aneurysm.  Comparison: CT scan dated 12/09/2012  Abdominal Aorta:  No aneurysm identified.        Maximum AP diameter:  2.5  cm.       Maximum TRV diameter:  2.8 cm.  IMPRESSION: No abdominal aortic aneurysm identified.Atherosclerotic irregularity.   Original Report Authenticated By: Francene Boyers, M.D.   Dg Abd Acute W/chest  08/06/2013   CLINICAL DATA:  Upper abdominal pain, nausea, vomiting.  EXAM: ACUTE ABDOMEN SERIES (ABDOMEN 2 VIEW & CHEST 1 VIEW)   COMPARISON:  CT 12/09/2012.  FINDINGS: Heart is borderline in size. Mild interstitial prominence throughout the lungs, likely chronic interstitial lung disease. No confluent opacities or effusions.  Mild gaseous distension of bowel throughout the abdomen and pelvis. This may reflect mild ileus. No evidence of bowel obstruction or free air. Prior cholecystectomy. No organomegaly. No suspicious calcification.  IMPRESSION: Mild diffuse gaseous distention of bowel, question mild ileus. No evidence of bowel obstruction or free air.  Diffuse interstitial prominence throughout the lungs, likely chronic interstitial lung disease.   Electronically Signed   By: Charlett Nose M.D.   On: 08/06/2013 16:10   Dg Abd Portable 2v  08/07/2013   *RADIOLOGY REPORT*  Clinical Data: NG tube placement  PORTABLE ABDOMEN - 2 VIEW  Comparison: 08/06/2013 CT  Findings: Contrast material is noted within nondilated colon as well as residual contrast within the bladder.  Nasogastric tube terminates over the expected location of the decompressed stomach. A loop of bowel over the mid abdomen could represent a mildly dilated small bowel but accurate measurement cannot be obtained due to overlying structures.  Overall, there is less gaseous small bowel distention than on prior exam.  No free air.  No air fluid level.  IMPRESSION: Interval distal passage of contrast and nondilated colon and decreased gaseous distention of small bowel, suggesting improvement in small bowel obstruction or ileus.   Original Report Authenticated By: Christiana Pellant, M.D.    Medications: . heparin  5,000 Units Subcutaneous Q8H    Assessment/Plan SBO; s/p cholecystectomy, SB repair, Hiatal hernia repair and inguinal hernia repair  Hospitalized 10/2012 with ARF/Dehydration/sepsis/acute diverticulitis/enteritis  Colon resection Jan 2014 in [facility], for Diverticulosis episode in 10/2012.   Plan:  I am going to try some clamping trials, and walk him around some  today.   Increase fluids to 125 ml/hr.   LOS: 2 days    Edmond Ginsberg 08/08/2013

## 2013-08-08 NOTE — Progress Notes (Addendum)
Care of pt assumed at this time.  Pt resting comfortably in chair, NG tube clamped since 1pm, tolerating well.  Denies questions or needs at this time.  Agree with previously charted assessment.  Will monitor.  Ardyth Gal, RN 08/08/2013

## 2013-08-09 ENCOUNTER — Encounter (HOSPITAL_COMMUNITY): Payer: Medicare Other | Admitting: Anesthesiology

## 2013-08-09 ENCOUNTER — Encounter (HOSPITAL_COMMUNITY): Payer: Self-pay | Admitting: Anesthesiology

## 2013-08-09 ENCOUNTER — Inpatient Hospital Stay (HOSPITAL_COMMUNITY): Payer: Medicare Other

## 2013-08-09 DIAGNOSIS — R0602 Shortness of breath: Secondary | ICD-10-CM

## 2013-08-09 DIAGNOSIS — J9600 Acute respiratory failure, unspecified whether with hypoxia or hypercapnia: Secondary | ICD-10-CM

## 2013-08-09 DIAGNOSIS — K56609 Unspecified intestinal obstruction, unspecified as to partial versus complete obstruction: Secondary | ICD-10-CM | POA: Diagnosis present

## 2013-08-09 DIAGNOSIS — J9601 Acute respiratory failure with hypoxia: Secondary | ICD-10-CM | POA: Diagnosis not present

## 2013-08-09 DIAGNOSIS — J811 Chronic pulmonary edema: Secondary | ICD-10-CM | POA: Diagnosis not present

## 2013-08-09 DIAGNOSIS — J810 Acute pulmonary edema: Secondary | ICD-10-CM

## 2013-08-09 LAB — D-DIMER, QUANTITATIVE (NOT AT ARMC): D-Dimer, Quant: 3.02 ug/mL-FEU — ABNORMAL HIGH (ref 0.00–0.48)

## 2013-08-09 LAB — BLOOD GAS, ARTERIAL
Acid-base deficit: 5.7 mmol/L — ABNORMAL HIGH (ref 0.0–2.0)
Bicarbonate: 21.1 mEq/L (ref 20.0–24.0)
Drawn by: 310571
FIO2: 1 %
MECHVT: 550 mL
O2 Saturation: 96.3 %
PEEP: 10 cmH2O
Patient temperature: 98.6
RATE: 15 resp/min
TCO2: 18.6 mmol/L (ref 0–100)
pCO2 arterial: 47.3 mmHg — ABNORMAL HIGH (ref 35.0–45.0)
pH, Arterial: 7.272 — ABNORMAL LOW (ref 7.350–7.450)
pO2, Arterial: 102 mmHg — ABNORMAL HIGH (ref 80.0–100.0)

## 2013-08-09 LAB — BASIC METABOLIC PANEL
BUN: 17 mg/dL (ref 6–23)
CO2: 24 mEq/L (ref 19–32)
Calcium: 8.4 mg/dL (ref 8.4–10.5)
Chloride: 111 mEq/L (ref 96–112)
Creatinine, Ser: 1.22 mg/dL (ref 0.50–1.35)
GFR calc Af Amer: 63 mL/min — ABNORMAL LOW (ref >90)
GFR calc non Af Amer: 54 mL/min — ABNORMAL LOW (ref >90)
Glucose, Bld: 98 mg/dL (ref 70–99)
Potassium: 4.2 mEq/L (ref 3.5–5.1)
Sodium: 143 mEq/L (ref 135–145)

## 2013-08-09 LAB — TROPONIN I
Troponin I: <0.3 ng/mL (ref <0.30)
Troponin I: <0.3 ng/mL (ref <0.30)

## 2013-08-09 LAB — LACTIC ACID, PLASMA: Lactic Acid, Venous: 4 mmol/L — ABNORMAL HIGH (ref 0.5–2.2)

## 2013-08-09 LAB — PROCALCITONIN: Procalcitonin: <0.1 ng/mL

## 2013-08-09 LAB — MRSA PCR SCREENING: MRSA by PCR: NEGATIVE

## 2013-08-09 LAB — GLUCOSE, CAPILLARY: Glucose-Capillary: 110 mg/dL — ABNORMAL HIGH (ref 70–99)

## 2013-08-09 IMAGING — DX DG CHEST 1V PORT
1 series · 2 of 2 positions shown · non-contrast
Comparison: Of [DATE]

CLINICAL DATA: Respiratory distress.

EXAM:
PORTABLE CHEST - 1 VIEW

[Series 1: AP · U · 2 of 2 slices shown]
[im 1/2]
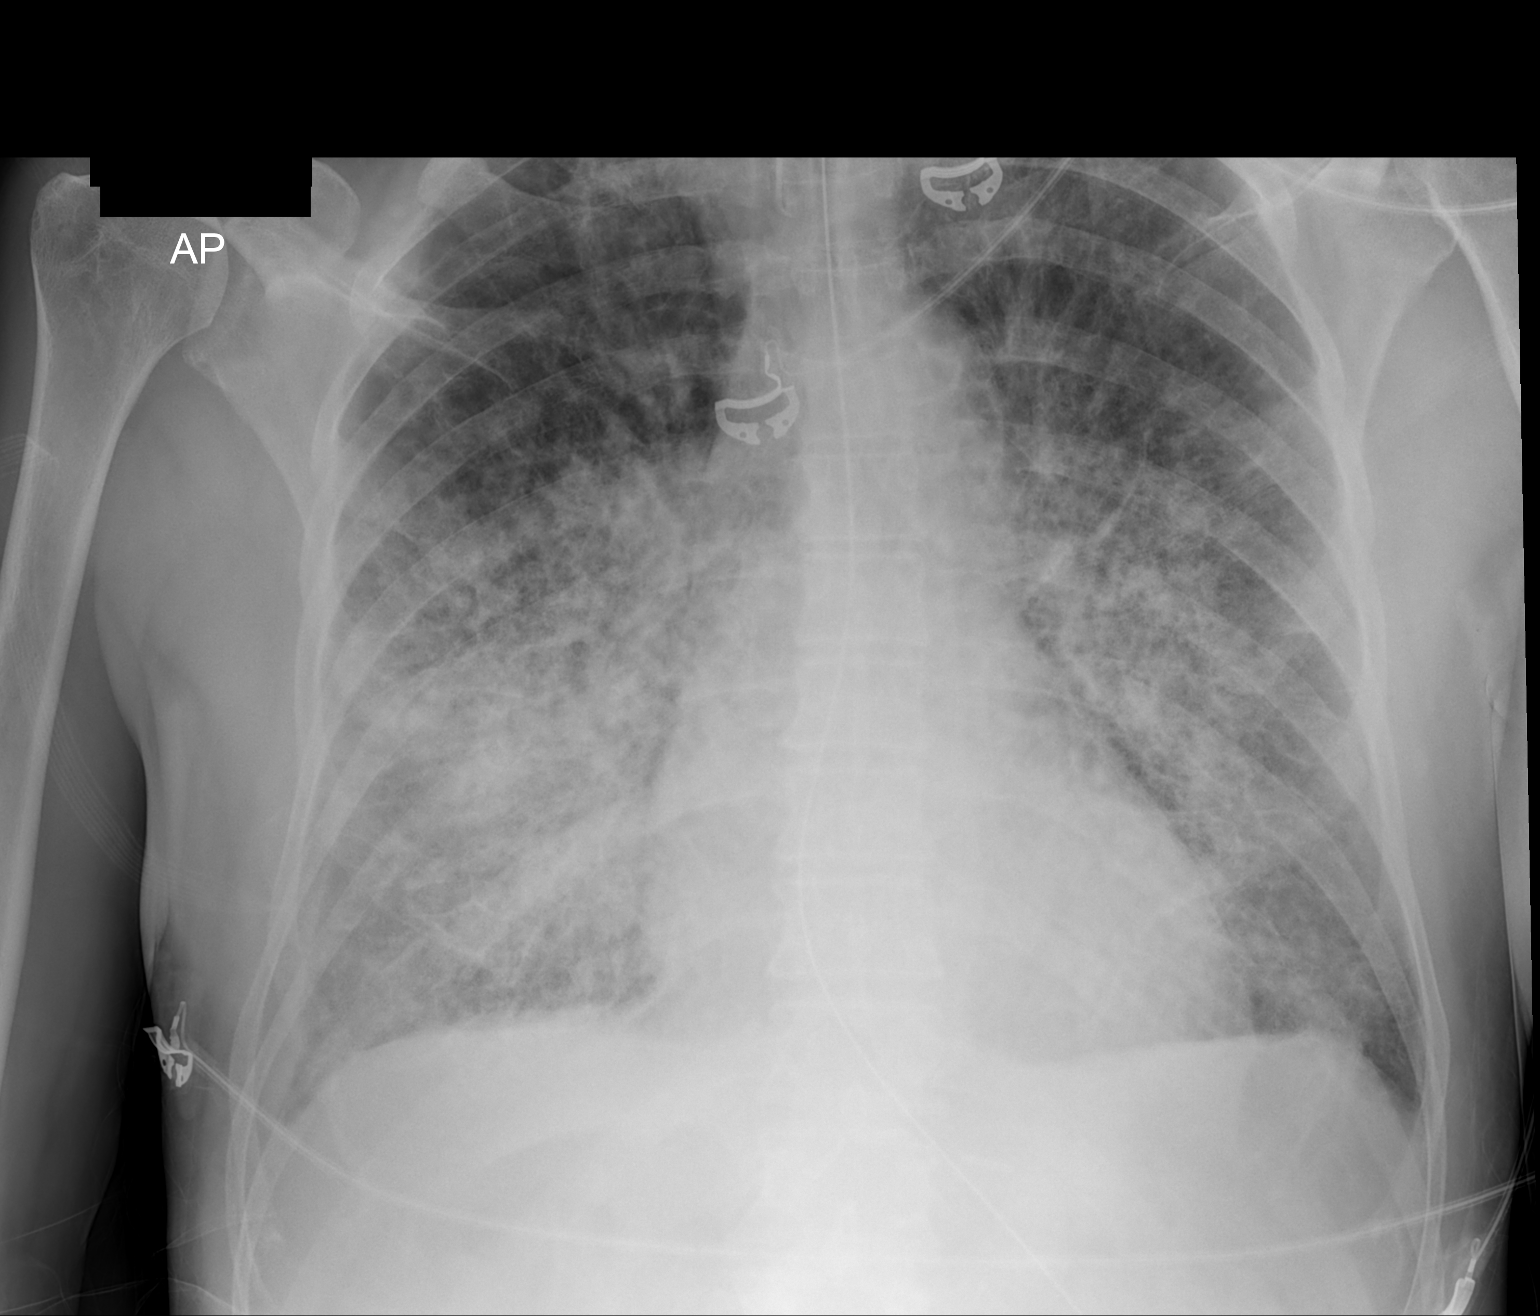
[im 2/2]
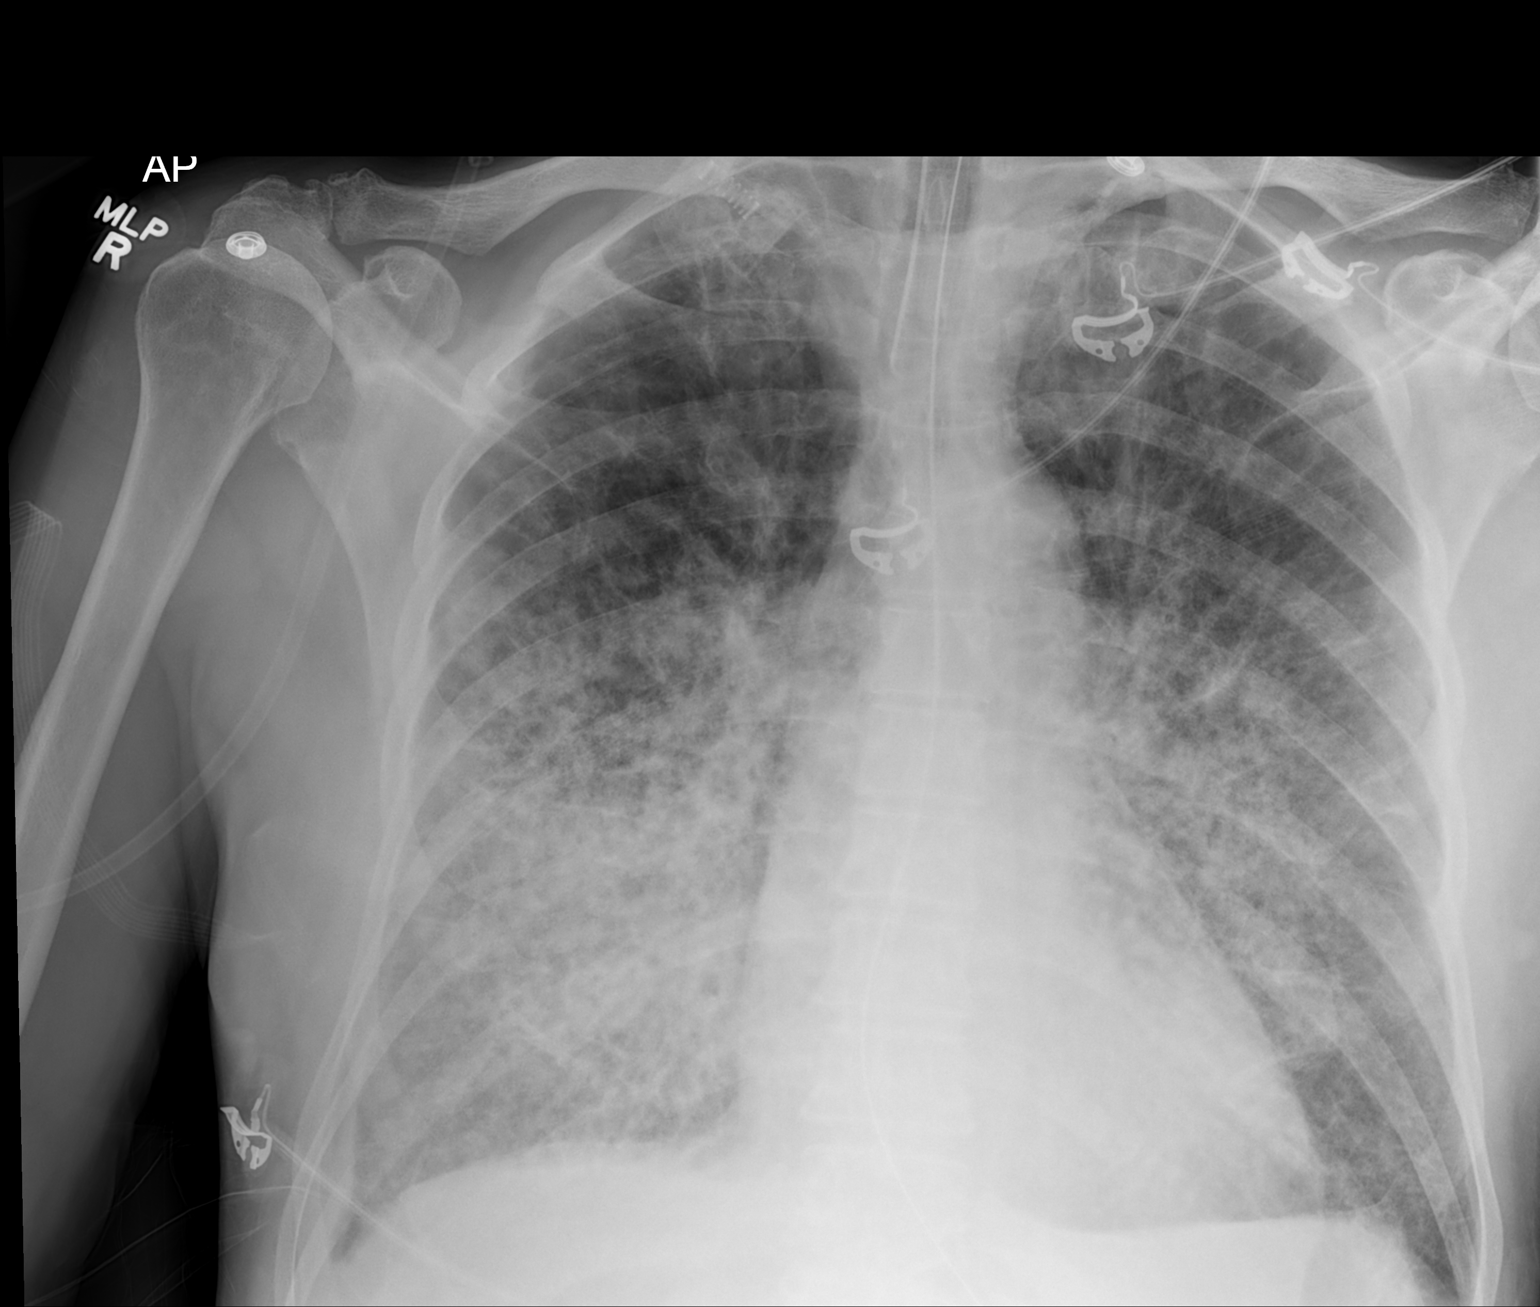

[2 of 2 positions shown; findings below may reference images not displayed]

FINDINGS: Endotracheal tube tip 6 cm above the carina. Bilateral interstitial
and airspace opacities minimally worsened compared to the earlier
exam. Underlying emphysema is present. No cardiomegaly.
IMPRESSION: 1. Mildly worsened perihilar airspace opacity and diffuse
interstitial opacity. Appearance may reflect ARDS, atypical
pneumonia, or noncardiogenic edema superimposed on underlying
emphysema.

## 2013-08-09 IMAGING — CR DG CHEST 1V PORT
1 series · 2 of 2 positions shown · non-contrast
Comparison: [DATE]

CLINICAL DATA: Shortness of breath.

EXAM:
PORTABLE CHEST - 1 VIEW

[Series 1: AP · U · 2 of 2 slices shown]
[im 1/2]
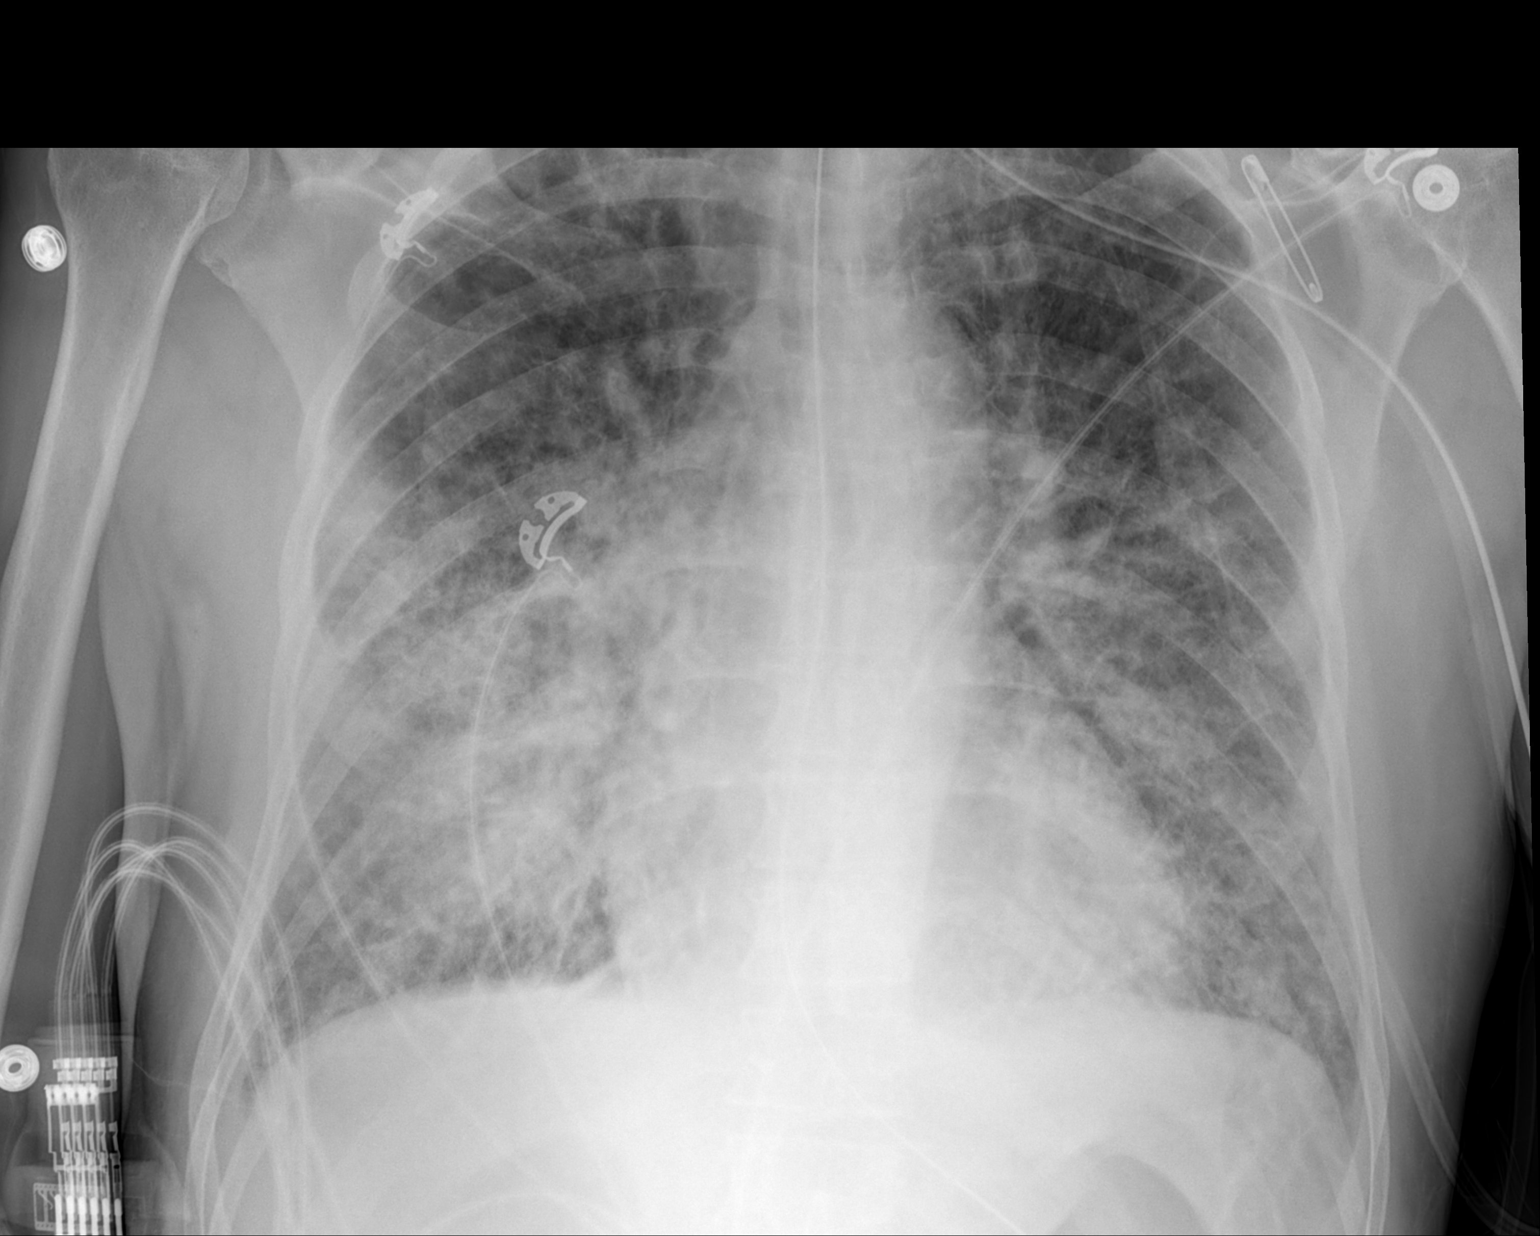
[im 2/2]
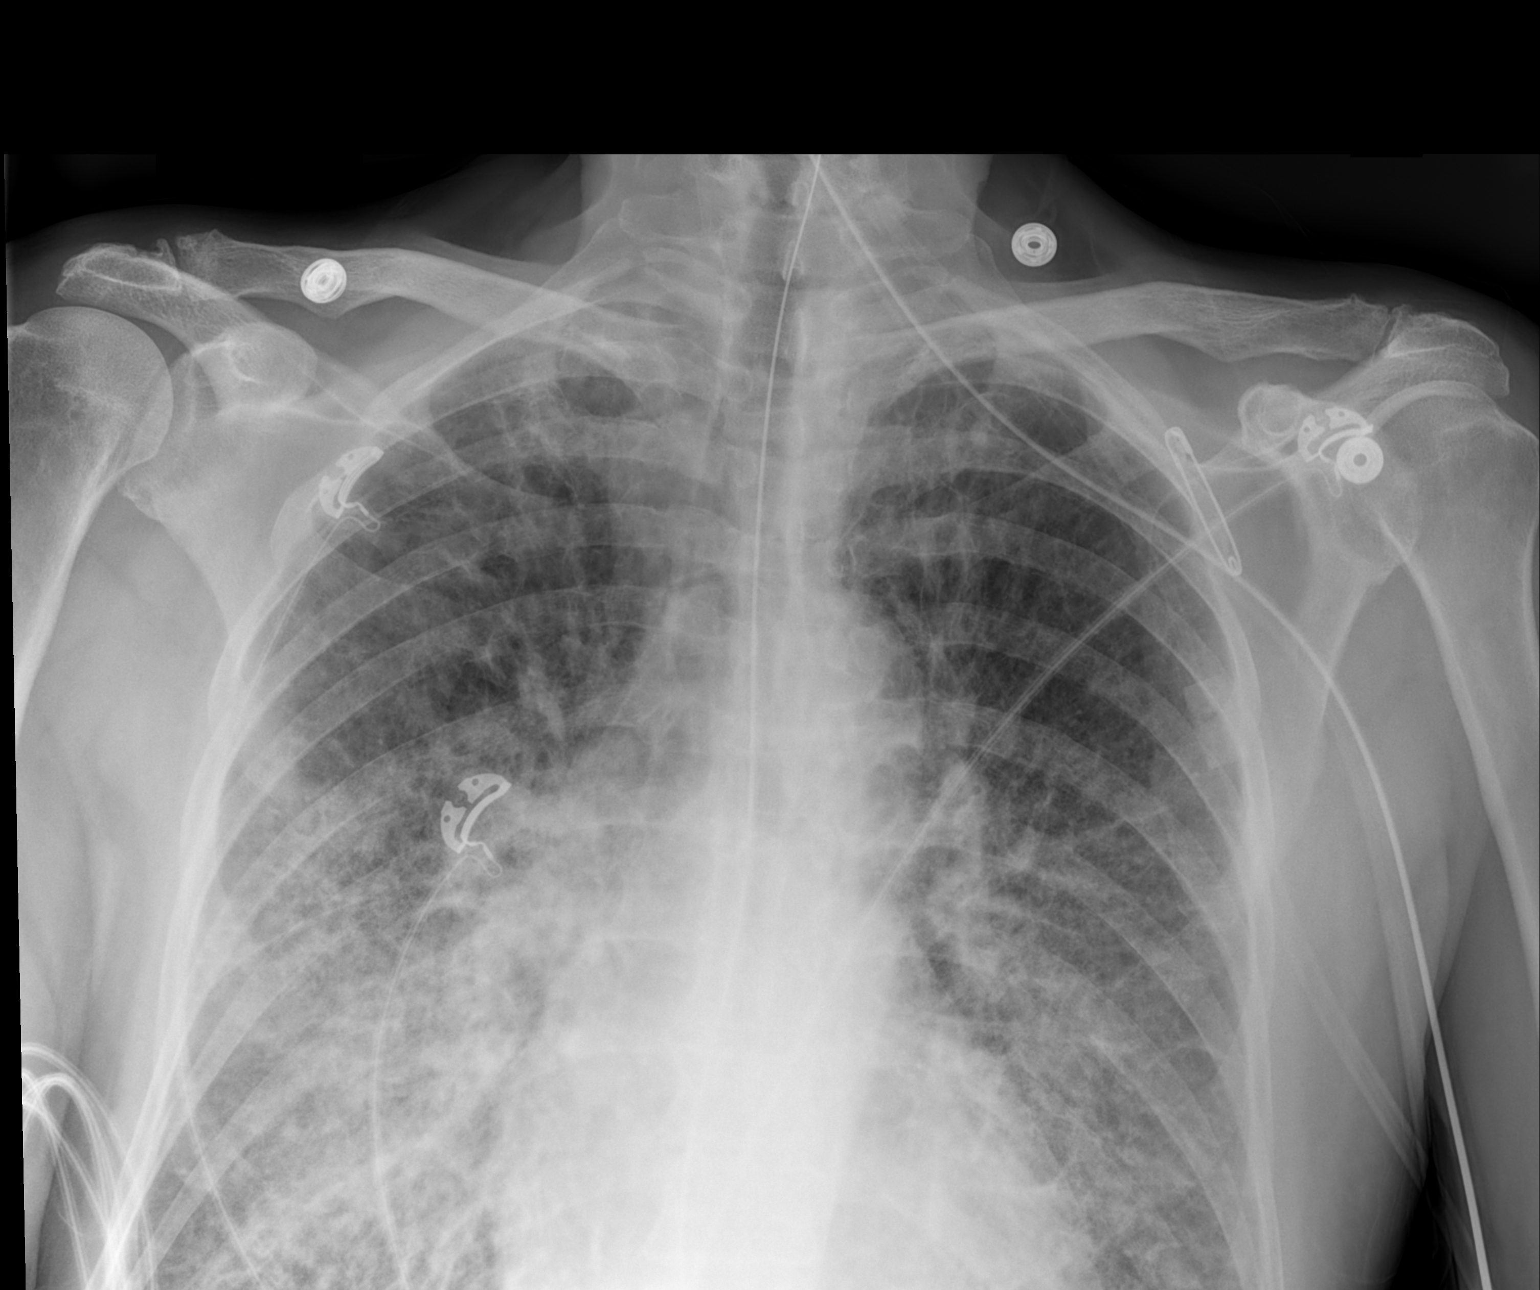

[2 of 2 positions shown; findings below may reference images not displayed]

FINDINGS: Nasogastric tube extends down into the stomach.

Bilateral interstitial and right greater than left perihilar
airspace opacities are present, significantly increased compared to
[DATE]. Heart size remains within normal limits.

Emphysema noted.
IMPRESSION: 1. Diffuse interstitial opacity with perihilar airspace opacities,
right greater than left, with top differential diagnostic
considerations including atypical pneumonia and acute noncardiogenic
pulmonary edema.
2. Emphysema.

## 2013-08-09 IMAGING — CR DG CHEST 1V PORT
1 series · 1 of 1 positions shown · non-contrast
Comparison: Study obtained earlier in the day

CLINICAL DATA: Hypoxia with endotracheal tube repositioning

EXAM:
PORTABLE CHEST - 1 VIEW

[AP]
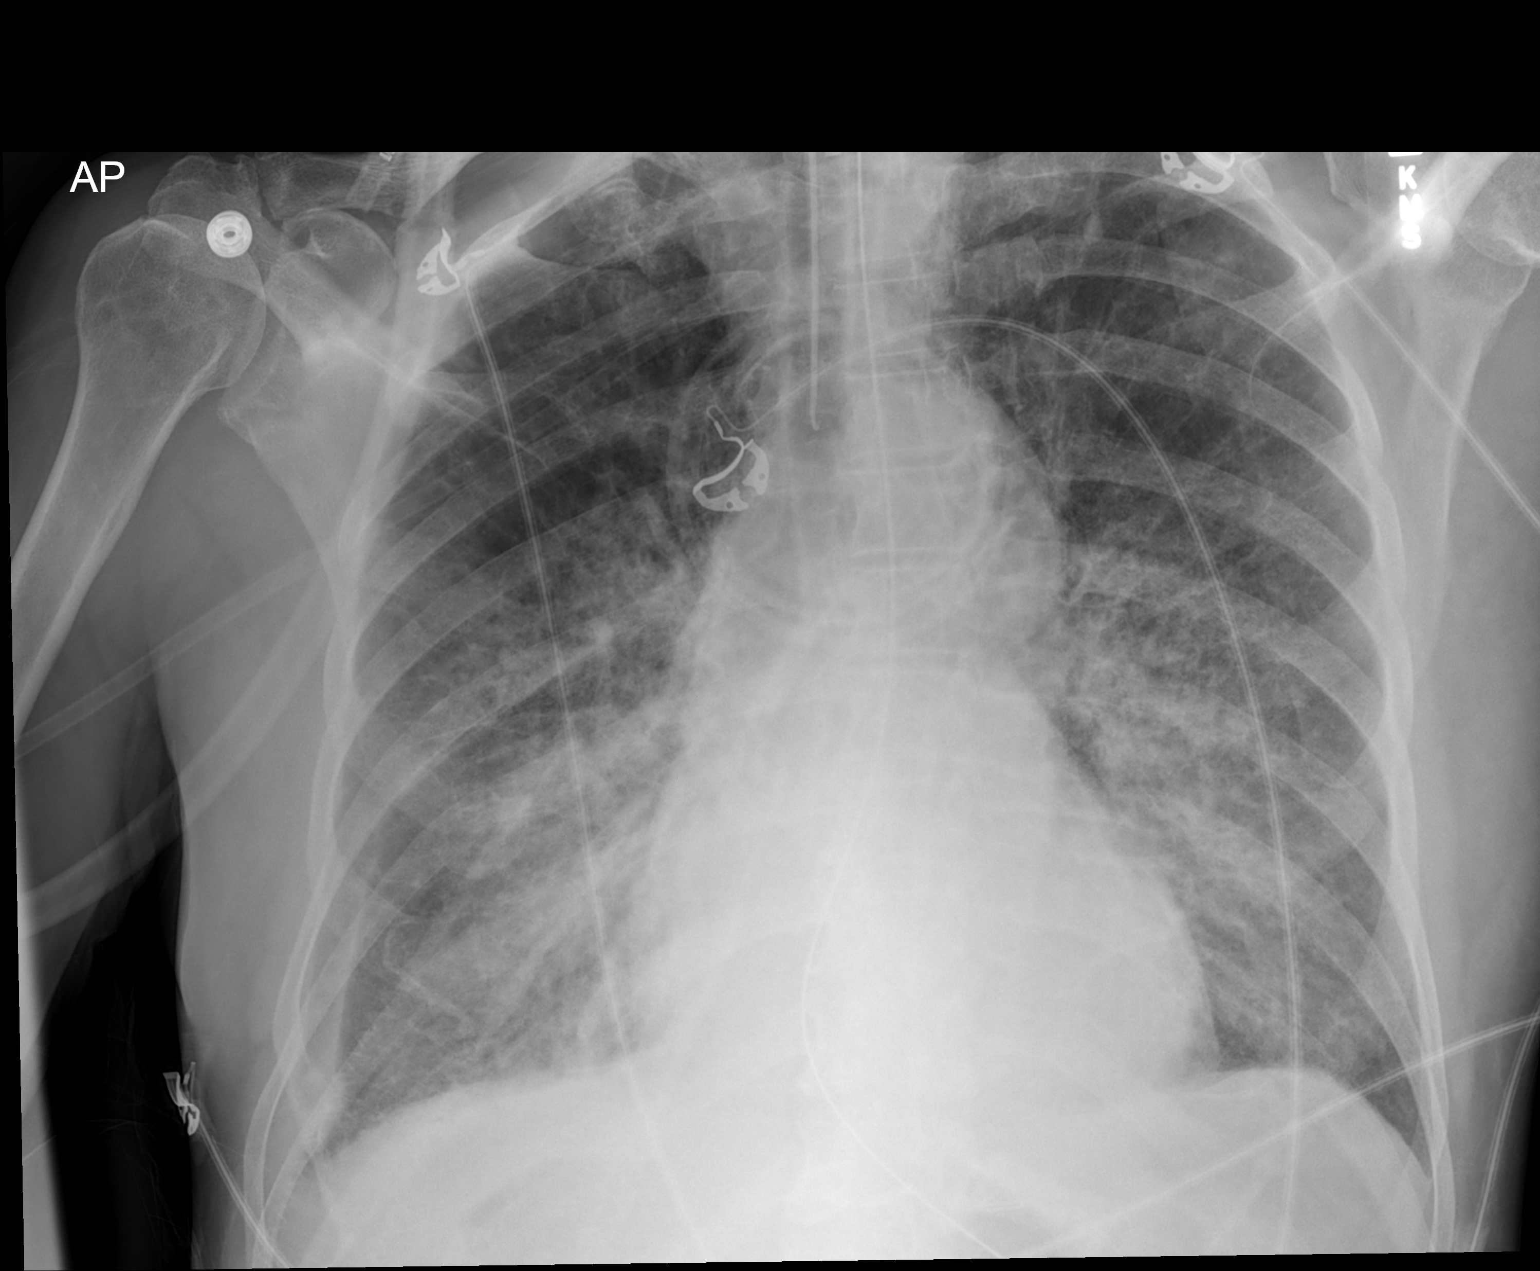

[1 of 1 positions shown; findings below may reference images not displayed]

FINDINGS: The endotracheal tube tip is 3.7 cm above the carina. No
pneumothorax. There remains moderate interstitial and alveolar
edema, less than earlier in the day. No new opacity. The heart size
and pulmonary vascularity are normal. No apparent adenopathy.
IMPRESSION: Endotracheal tube tip 3.7 cm above the carina. No pneumothorax.
Moderate edema, less than earlier in the day. No new opacity.

## 2013-08-09 MED ORDER — BIOTENE DRY MOUTH MT LIQD
15.0000 mL | Freq: Four times a day (QID) | OROMUCOSAL | Status: DC
  Administered 2013-08-09 – 2013-08-10 (×3): 15 mL via OROMUCOSAL

## 2013-08-09 MED ORDER — SODIUM CHLORIDE 0.9 % IV SOLN
25.0000 ug/h | INTRAVENOUS | Status: DC
  Administered 2013-08-09: 25 ug/h via INTRAVENOUS
  Filled 2013-08-09: qty 50

## 2013-08-09 MED ORDER — MIDAZOLAM HCL 5 MG/5ML IJ SOLN
INTRAMUSCULAR | Status: DC | PRN
  Administered 2013-08-09: 2 mg via INTRAVENOUS

## 2013-08-09 MED ORDER — MIDAZOLAM HCL 2 MG/2ML IJ SOLN
2.0000 mg | INTRAMUSCULAR | Status: DC | PRN
  Administered 2013-08-09: 2 mg via INTRAVENOUS
  Administered 2013-08-09: 4 mg via INTRAVENOUS
  Administered 2013-08-09: 2 mg via INTRAVENOUS
  Administered 2013-08-09: 4 mg via INTRAVENOUS
  Administered 2013-08-10 (×3): 2 mg via INTRAVENOUS
  Filled 2013-08-09: qty 4
  Filled 2013-08-09: qty 2
  Filled 2013-08-09: qty 4
  Filled 2013-08-09: qty 2
  Filled 2013-08-09: qty 4

## 2013-08-09 MED ORDER — PROPOFOL 10 MG/ML IV BOLUS
INTRAVENOUS | Status: DC | PRN
  Administered 2013-08-09: 20 mg via INTRAVENOUS
  Administered 2015-09-17: 110 mg via INTRAVENOUS

## 2013-08-09 MED ORDER — LEVALBUTEROL HCL 0.63 MG/3ML IN NEBU
0.6300 mg | INHALATION_SOLUTION | Freq: Once | RESPIRATORY_TRACT | Status: DC
  Filled 2013-08-09: qty 3

## 2013-08-09 MED ORDER — FENTANYL BOLUS VIA INFUSION
25.0000 ug | Freq: Four times a day (QID) | INTRAVENOUS | Status: DC | PRN
  Administered 2013-08-09: 75 ug via INTRAVENOUS
  Administered 2013-08-09: 50 ug via INTRAVENOUS
  Filled 2013-08-09: qty 100

## 2013-08-09 MED ORDER — CHLORHEXIDINE GLUCONATE 0.12 % MT SOLN
15.0000 mL | Freq: Two times a day (BID) | OROMUCOSAL | Status: DC
  Administered 2013-08-09: 15 mL via OROMUCOSAL
  Filled 2013-08-09 (×3): qty 15

## 2013-08-09 MED ORDER — FENTANYL CITRATE 0.05 MG/ML IJ SOLN
100.0000 ug | Freq: Once | INTRAMUSCULAR | Status: AC
  Administered 2013-08-09: 100 ug via INTRAVENOUS

## 2013-08-09 MED ORDER — MIDAZOLAM HCL 2 MG/2ML IJ SOLN
4.0000 mg | Freq: Once | INTRAMUSCULAR | Status: AC
  Administered 2013-08-09: 4 mg via INTRAVENOUS
  Filled 2013-08-09: qty 4

## 2013-08-09 MED ORDER — FUROSEMIDE 10 MG/ML IJ SOLN
INTRAMUSCULAR | Status: AC
  Administered 2013-08-09: 40 mg
  Filled 2013-08-09: qty 4

## 2013-08-09 MED ORDER — FUROSEMIDE 10 MG/ML IJ SOLN
40.0000 mg | Freq: Three times a day (TID) | INTRAMUSCULAR | Status: AC
  Administered 2013-08-09 (×2): 40 mg via INTRAVENOUS
  Filled 2013-08-09: qty 4

## 2013-08-09 MED ORDER — MORPHINE SULFATE 10 MG/ML IJ SOLN
INTRAMUSCULAR | Status: AC
  Administered 2013-08-09: 10 mg
  Filled 2013-08-09: qty 1

## 2013-08-09 NOTE — Consult Note (Signed)
PULMONARY  / CRITICAL CARE MEDICINE  Name: [name] [name] MRN: [id number] DOB: [birth year]    ADMISSION DATE:  08/06/2013 CONSULTATION DATE:  08/09/13  REFERRING MD :  [facility] - Dr. Biagio Quint PRIMARY SERVICE: [facility]  CHIEF COMPLAINT:  Acute Respiratory Failure  BRIEF PATIENT DESCRIPTION: 79 y/o M admitted 9/17 with diffuse abdominal pain, N/V.  Found to have SBO on CT ABD.  Medically managed per [facility].  Decompensated 9/20 from a respiratory standpoint requiring intubation.  PCCM consulted for ICU mgmt.  SIGNIFICANT EVENTS / STUDIES:  9/17 - Admit per [facility] for SBO.  CT ABD >>concerning for SBO 9/20 - Decompensated on floor with resp distress, intubated.  Tx to ICU  LINES / TUBES: OETT 9/20>>> PIVs Foley 9/20  CULTURES: BCx2 9/20>>> Sputum 9/20>>>  ANTIBIOTICS: NONE  HISTORY OF PRESENT ILLNESS:  79 y/o M with PMH of laparoscopic cholecystectomy and open hiatal hernia repair (remote), bilateral inguinal hernia repair, diverticulitis, perforated small bowel s/p resection 11/2012 (in [place]) who presented to [facility] ER on 9/17 with diffuse abdominal pain, nausea and vomiting.  He relayed he had abdominal bloating after meals for 2 weeks prior to admit  Hospital evaluation revealed a CT ABD with dilated small bowel with poor progression of oral contrast  material, no definite transition point or etiology of obstruction.  Patient was treated with medical management - bowel rest, NGT etc.  9/19 NGT clamped and patient tolerated well.  Early am 9/20 patient was awake / alert, developed cough overnight.  Around lunch time 9/20, patient developed acute onset shortness of breath with cxr concerning for edema, progression to obtunded with sats in 50's requiring intubation per anesthesia.  PCCM consulted for ICU mgmt.    PAST MEDICAL HISTORY :  History reviewed. No pertinent past medical history. Past Surgical History  Procedure Laterality Date  . Cholecystectomy    . Inguinal hernia repair     Prior to  Admission medications   Medication Sig Start Date End Date Taking? Authorizing Provider  Ascorbic Acid (VITAMIN C PO) Take 1,000 mg by mouth daily.    Yes Historical Provider, MD  CALCIUM-MAGNESIUM-ZINC PO Take 1 tablet by mouth daily.   Yes Historical Provider, MD  carboxymethylcellulose (REFRESH PLUS) 0.5 % SOLN Place 1 drop into both ears 3 (three) times daily as needed.   Yes Historical Provider, MD  Cholecalciferol (VITAMIN D PO) Take 1 tablet by mouth daily.   Yes Historical Provider, MD  CINNAMON PO Take 1 tablet by mouth daily.   Yes Historical Provider, MD  Coenzyme Q10 (CO Q-10 PO) Take 1 capsule by mouth daily.   Yes Historical Provider, MD  fish oil-omega-3 fatty acids 1000 MG capsule Take 1 g by mouth daily.   Yes Historical Provider, MD  hydrocortisone cream 1 % Apply 1 application topically 2 (two) times daily as needed. Itching   Yes Historical Provider, MD  NIACIN PO Take 1 tablet by mouth daily.   Yes Historical Provider, MD  omeprazole (PRILOSEC) 20 MG capsule Take 20 mg by mouth daily.   Yes Historical Provider, MD  POTASSIUM PO Take 1 tablet by mouth daily.   Yes Historical Provider, MD  Pyridoxine HCl (VITAMIN B-6 PO) Take 1 tablet by mouth daily.   Yes Historical Provider, MD  Thiamine HCl (VITAMIN B-1 PO) Take 1 tablet by mouth daily.   Yes Historical Provider, MD  VITAMIN A PO Take 1 tablet by mouth daily.   Yes Historical Provider, MD  VITAMIN E PO Take  1 tablet by mouth daily.   Yes Historical Provider, MD  Cyanocobalamin (VITAMIN B-12 PO) Take 1 tablet by mouth daily.    Historical Provider, MD   No Known Allergies  FAMILY HISTORY:  No family history on file. SOCIAL HISTORY:  reports that he has never smoked. He has never used smokeless tobacco. He reports that he does not drink alcohol. His drug history is not on file.  REVIEW OF SYSTEMS:  Unable to complete as pt is altered on vent.   SUBJECTIVE:  Agitated, not yet getting sedatives  VITAL SIGNS: Temp:   [97.5 F (36.4 C)-98.1 F (36.7 C)] 98.1 F (36.7 C) (09/20 0444) Pulse Rate:  [56-100] 100 (09/20 1104) Resp:  [18-22] 22 (09/20 1104) BP: (112-154)/(52-76) 154/52 mmHg (09/20 1104) SpO2:  [94 %-95 %] 94 % (09/20 1104) FiO2 (%):  [100 %] 100 % (09/20 1212)  HEMODYNAMICS:    VENTILATOR SETTINGS: Vent Mode:  [-] PRVC FiO2 (%):  [100 %] 100 % Set Rate:  [15 bmp] 15 bmp Vt Set:  [550 mL] 550 mL PEEP:  [10 cmH20] 10 cmH20 Plateau Pressure:  [31 cmH20] 31 cmH20  INTAKE / OUTPUT: Intake/Output     09/19 0701 - 09/20 0700 09/20 0701 - 09/21 0700   P.O. 600 480   I.V. (mL/kg) 2347.9 (34.7)    NG/GT 700    Total Intake(mL/kg) 3647.9 (53.9) 480 (7.1)   Urine (mL/kg/hr) 1300 (0.8)    Emesis/NG output     Total Output 1300     Net +2347.9 +480        Stool Occurrence 1 x      PHYSICAL EXAMINATION: General:  wdwn elderly male in NAD on vent Neuro: sedate on vent HEENT:  Mm pink/moist, OETT / NGT in place Cardiovascular:  s1s2 rrr, distant tones due to adventitious breath sounds Lungs:  resp's even/non-labored, lungs bilaterally coarse rhonchi, crackles, pink frothy secretions in HME Abdomen:  Distended, slightly firm, decreased BS Musculoskeletal:  No acute deformities Skin:  Warm/dry, no edema  LABS:  CBC Recent Labs     08/06/13  1430  08/07/13  0445  08/08/13  0400  WBC  8.3  8.8  8.3  HGB  15.3  12.4*  13.5  HCT  46.4  38.4*  41.2  PLT  161  147*  147*   Coag's No results found for this basename: APTT, INR,  in the last 72 hours BMET Recent Labs     08/07/13  0445  08/08/13  0400  08/09/13  0621  NA  139  142  143  K  3.8  3.6  4.2  CL  105  106  111  CO2  25  25  24   BUN  16  18  17   CREATININE  1.25  1.31  1.22  GLUCOSE  102*  70  98   Electrolytes Recent Labs     08/07/13  0445  08/08/13  0400  08/09/13  0621  CALCIUM  8.2*  8.7  8.4   Sepsis Markers No results found for this basename: LACTICACIDVEN, PROCALCITON, O2SATVEN,  in the last 72  hours ABG No results found for this basename: PHART, PCO2ART, PO2ART,  in the last 72 hours Liver Enzymes Recent Labs     08/06/13  1430  AST  23  ALT  16  ALKPHOS  122*  BILITOT  0.3  ALBUMIN  3.9   Cardiac Enzymes Recent Labs     08/06/13  1448  TROPONINI  <0.30   Glucose No results found for this basename: GLUCAP,  in the last 72 hours  Imaging Dg Chest Port 1 View  08/09/2013   CLINICAL DATA:  Shortness of breath.  EXAM: PORTABLE CHEST - 1 VIEW  COMPARISON:  08/06/2013  FINDINGS: Nasogastric tube extends down into the stomach.  Bilateral interstitial and right greater than left perihilar airspace opacities are present, significantly increased compared to 08/06/2013. Heart size remains within normal limits.  Emphysema noted.  IMPRESSION: 1. Diffuse interstitial opacity with perihilar airspace opacities, right greater than left, with top differential diagnostic considerations including atypical pneumonia and acute noncardiogenic pulmonary edema. 2. Emphysema.   Electronically Signed   By: Herbie Baltimore   On: 08/09/2013 11:48    ASSESSMENT / PLAN:  PULMONARY A: Acute Respiratory Failure - in setting of pulmonary edema.  Lower lungs on CT ABD were essentially clear on admit.  Acute Pulmonary Edema  Acute Hypoxemia  P:   -full vent support -f/u ABG in one hour -f/u cxr in am  -assess D-Dimer, LE Doppler for stratification of PE -SBT / WUA in am  -now lasix -aspiration precautions  CARDIOVASCULAR A:  Tachycardia  R/O MI - in setting of acute decompensation  CAD - calcifications noted on CT abd P:  -assess EKG -cycle enzymes -assess ECHO   RENAL A:   P:   -follow sr cr with diuresis -NS with KCL, decrease to KVO -monitory lytes with NGT  GASTROINTESTINAL A:   SBO - verified with CT Abd P:   -NGT to suction  -bowel rest -maintain electrolytes -PPI -? Short term course reglan to aide in motility  HEMATOLOGIC A:   DVT Proph  P:  -Heparin for  DVT  INFECTIOUS A:   Low clinical suspicion for acute infectious etiology P:   -assess PCT -assess blood cultures, sputum   ENDOCRINE A:   Hyperglycemia - no hx of DM  P:   -monitor glucose -q6 CBG's while NPO  NEUROLOGIC A:   Acute Encephalopathy - in setting of hypoxemia / respiratory distress P:   -monitor mental status, more alert post intubation.  Following commands.  -sedation protocol  Canary Brim, NP-C [facility] Pgr: [phone] or [phone]    I have personally obtained a history, examined the patient, evaluated laboratory and imaging results, formulated the assessment and plan and placed orders.  Todays summary: Acute onset pulmonary edema on an 82yoM admitted with SBO on surgery service. Plan full vent supp, diureses as able, cycle enzymes, check echo.   CRITICAL CARE: The patient is critically ill with multiple organ systems failure and requires high complexity decision making for assessment and support, frequent evaluation and titration of therapies, application of advanced monitoring technologies and extensive interpretation of multiple databases. Critical Care Time devoted to patient care services described in this note is 40 minutes.   Dorcas Carrow Beeper  [phone]  Cell  [phone]  If no response or cell goes to voicemail, call beeper [phone]  08/09/2013, 12:31 PM

## 2013-08-09 NOTE — Progress Notes (Signed)
Patient still has the NG tube clamped.  Patient did not c/o nausea or vomiting last night. Will continue to monitor the patient.

## 2013-08-09 NOTE — Progress Notes (Signed)
Called to patient's room by patient's daughter stating that pt could not breathe. Upon entering room pt noted to be in respiratory distress sitting on side trying to catch his breath  with abdominal breathing. Congested breath sounds noted. MD on floor and called to pt's room. Rapid response initiated. Rapid response up to floor and assisted with care of patient. Anesthesia notified for intubation. Pt intubated on floor at 1148. 2 mg of versed given IV at 1151. 10 of morphine IV also given at 1152.  20 of deprivan given at 1154. Pt was intubated and transferred to ICU rm 1232. Vwilliams,rn.

## 2013-08-09 NOTE — Progress Notes (Signed)
VASCULAR LAB PRELIMINARY  PRELIMINARY  PRELIMINARY  PRELIMINARY  Bilateral lower extremity venous Dopplers completed.    Preliminary report:  There is no DVT or SVT noted in the bilateral lower extremities.  Joyce Leckey, RVT 08/09/2013, 5:48 PM

## 2013-08-09 NOTE — Significant Event (Signed)
Rapid Response Event Note  Overview: Time Called: 1130 Arrival Time: 1133 Event Type: Respiratory  Initial Focused Assessment: Pt lying in bed, in resp distress. Pt diaphoretic and tachypneic.  Dr Biagio Quint present.  Pt was on NRM, became less responsive, assisted with ambu bag until anesthesia arrived to intubate pt.   Interventions: Lasix IV push given, EKG done.  Intubated by anesthesia. Given MS 10mg , Versed and Diprivan per CRNA.     Event Summary:Pt transferred to rm 1232.   BP 168/95.      at      at    Outcome: Transferred (Comment)     Natasha Bence

## 2013-08-09 NOTE — Progress Notes (Addendum)
  Subjective: He has been doing okay.  Tolerating clears.  No nausea or vomiting.  No abdominal pain.  Had acute onset of SOB this am and has had a cough develop last night.    Objective: Vital signs in last 24 hours: Temp:  [97.5 F (36.4 C)-98.1 F (36.7 C)] 98.1 F (36.7 C) (09/20 0444) Pulse Rate:  [56-100] 100 (09/20 1104) Resp:  [18-22] 22 (09/20 1104) BP: (112-154)/(52-76) 154/52 mmHg (09/20 1104) SpO2:  [94 %-95 %] 94 % (09/20 1104) Last BM Date: 08/06/13  Intake/Output from previous day: 09/19 0701 - 09/20 0700 In: 3647.9 [P.O.:600; I.V.:2347.9; NG/GT:700] Out: 1300 [Urine:1300] Intake/Output this shift: Total I/O In: 480 [P.O.:480] Out: -   General appearance: alert, cooperative and no distress Resp: bilat wheeze, sats, 94% on 2L [place] Cardio: HR 94, regular, on tele and rhythm strip looks okay without EKG changes GI: soft, nontender, mild distension, no peritoneal signs, NG clamped  Lab Results:   Recent Labs  08/07/13 0445 08/08/13 0400  WBC 8.8 8.3  HGB 12.4* 13.5  HCT 38.4* 41.2  PLT 147* 147*   BMET  Recent Labs  08/08/13 0400 08/09/13 0621  NA 142 143  K 3.6 4.2  CL 106 111  CO2 25 24  GLUCOSE 70 98  BUN 18 17  CREATININE 1.31 1.22  CALCIUM 8.7 8.4   PT/INR No results found for this basename: LABPROT, INR,  in the last 72 hours ABG No results found for this basename: PHART, PCO2, PO2, HCO3,  in the last 72 hours  Studies/Results: No results found.  Anti-infectives: Anti-infectives   None      Assessment/Plan: s/p * No surgery found * will hold on advancing since he is still a little distended and given his acute respiratory issue.  He looks okay but sounds like he has some wheezing.  Given his new cough and wheeze will try xopenex (HR 94) and check xray, I suspect that this is respiratory origin and possible pneumonia.  If xray with infiltrate, will start abx.  Continuous pulse ox.  Check EKG.  On subq heparin.  LOS: 3 days     Ceilidh Torregrossa DAVID 08/09/2013  Within minutes of writing this note, he began having more significant cough and labored breathing.   xray was concerning for pulmonary edema.  40mg  lasix given and foley placed but he continued to struggle with his breathing.  He became obtunded and sats down to 50's.  Anesthesia performed rapid  intubation in the room and we have transferred him to ICU for vent management.  Will plan on consulting CCM for vent management and management of his pulmonary edema.  This could be aspiration as well or pneumonia.  Will leave NG to suction.

## 2013-08-09 NOTE — Progress Notes (Signed)
Comfortable on vent.  sats normal.  O2 down to 50%.  Xray improved.

## 2013-08-09 NOTE — Progress Notes (Signed)
Much more comfortable.  sats 91% on 80%fio2.  HR 70 NSR

## 2013-08-10 ENCOUNTER — Inpatient Hospital Stay (HOSPITAL_COMMUNITY): Payer: Medicare Other

## 2013-08-10 DIAGNOSIS — J95821 Acute postprocedural respiratory failure: Secondary | ICD-10-CM

## 2013-08-10 DIAGNOSIS — I359 Nonrheumatic aortic valve disorder, unspecified: Secondary | ICD-10-CM

## 2013-08-10 DIAGNOSIS — K56609 Unspecified intestinal obstruction, unspecified as to partial versus complete obstruction: Secondary | ICD-10-CM

## 2013-08-10 LAB — GLUCOSE, CAPILLARY
Glucose-Capillary: 103 mg/dL — ABNORMAL HIGH (ref 70–99)
Glucose-Capillary: 93 mg/dL (ref 70–99)
Glucose-Capillary: 98 mg/dL (ref 70–99)
Glucose-Capillary: 111 mg/dL — ABNORMAL HIGH (ref 70–99)

## 2013-08-10 LAB — CBC
HCT: 46.5 % (ref 39.0–52.0)
Hemoglobin: 15.2 g/dL (ref 13.0–17.0)
MCH: 29 pg (ref 26.0–34.0)
MCHC: 32.7 g/dL (ref 30.0–36.0)
MCV: 88.6 fL (ref 78.0–100.0)
Platelets: 180 10*3/uL (ref 150–400)
RBC: 5.25 MIL/uL (ref 4.22–5.81)
RDW: 14.9 % (ref 11.5–15.5)
WBC: 14.3 10*3/uL — ABNORMAL HIGH (ref 4.0–10.5)

## 2013-08-10 LAB — PROCALCITONIN: Procalcitonin: 0.38 ng/mL

## 2013-08-10 LAB — BASIC METABOLIC PANEL
BUN: 17 mg/dL (ref 6–23)
CO2: 26 mEq/L (ref 19–32)
Calcium: 9 mg/dL (ref 8.4–10.5)
Chloride: 103 mEq/L (ref 96–112)
Creatinine, Ser: 1.53 mg/dL — ABNORMAL HIGH (ref 0.50–1.35)
GFR calc Af Amer: 48 mL/min — ABNORMAL LOW (ref >90)
GFR calc non Af Amer: 41 mL/min — ABNORMAL LOW (ref >90)
Glucose, Bld: 119 mg/dL — ABNORMAL HIGH (ref 70–99)
Potassium: 4 mEq/L (ref 3.5–5.1)
Sodium: 139 mEq/L (ref 135–145)

## 2013-08-10 LAB — TROPONIN I: Troponin I: <0.3 ng/mL (ref <0.30)

## 2013-08-10 IMAGING — CR DG CHEST 1V PORT
1 series · 1 of 1 positions shown · non-contrast
Comparison: [DATE]

CLINICAL DATA: Check ETT

EXAM:
PORTABLE CHEST - 1 VIEW

[AP]
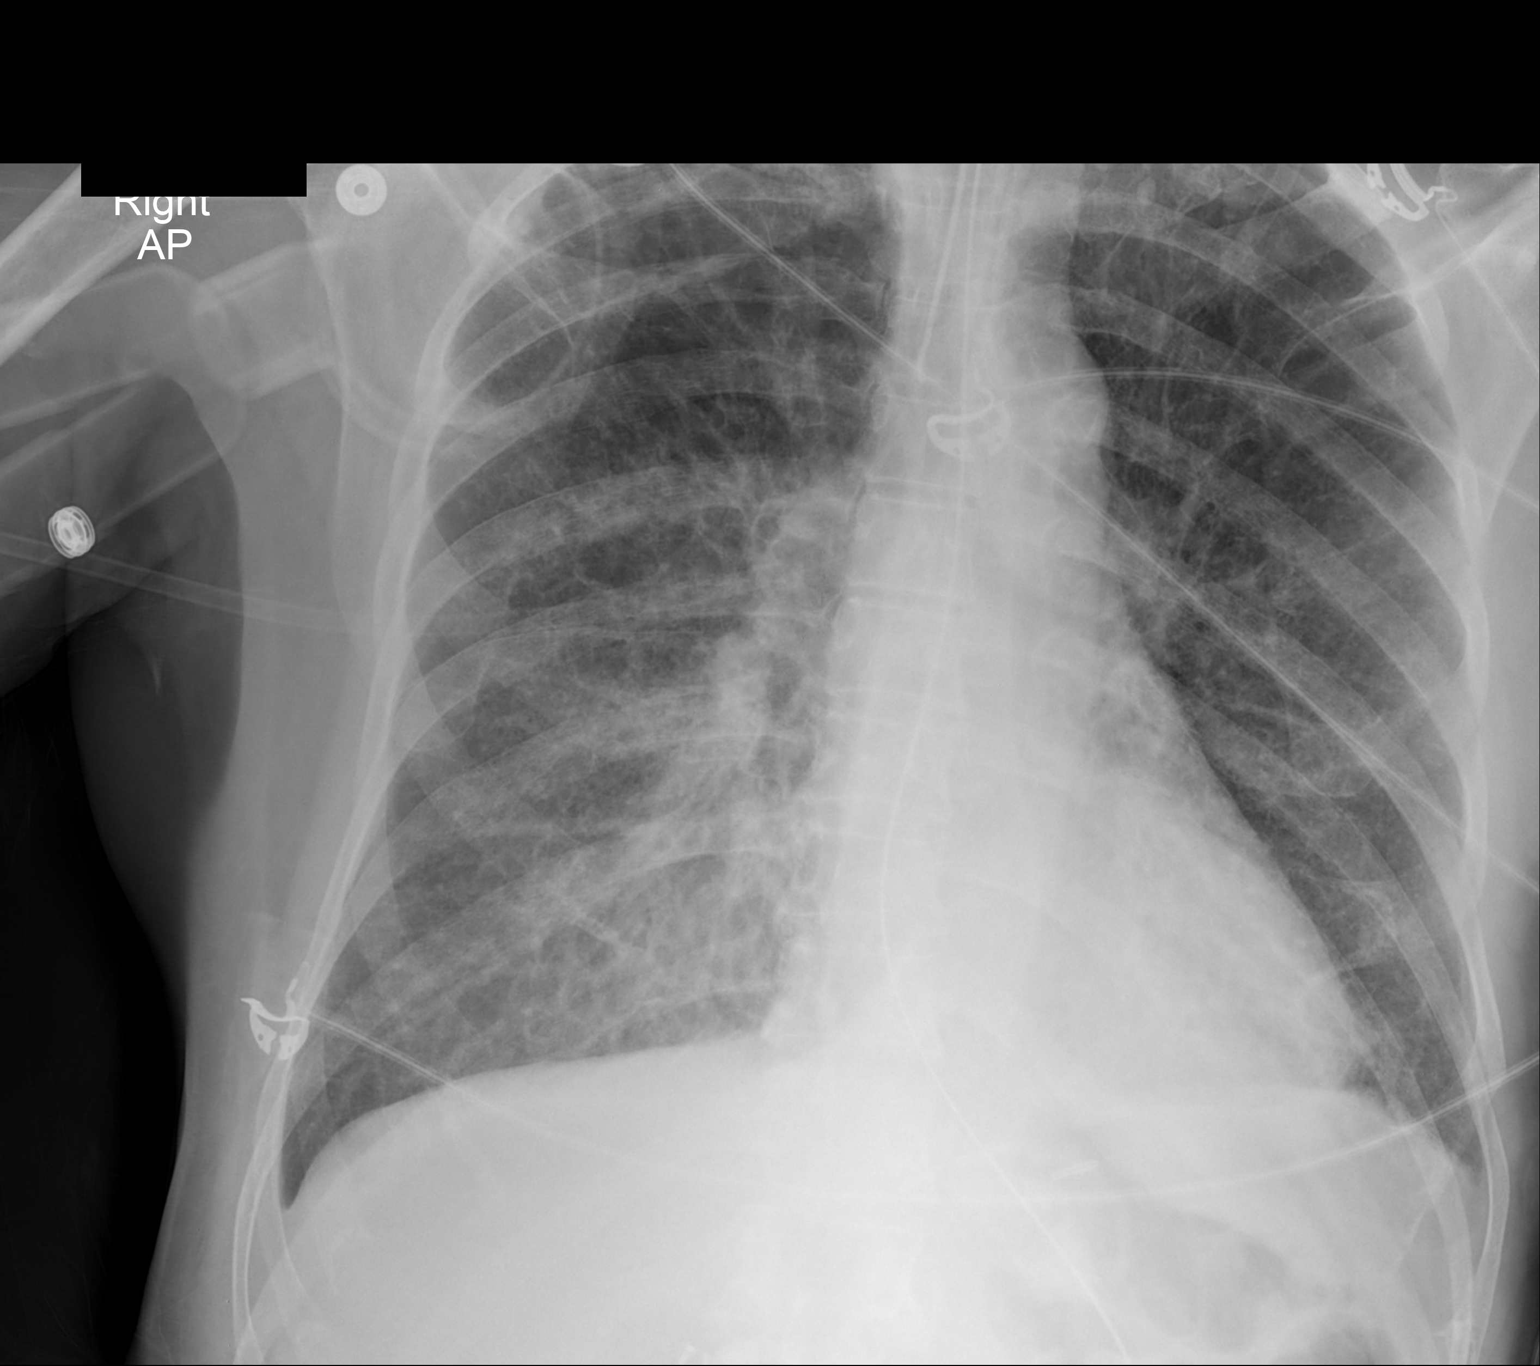

[1 of 1 positions shown; findings below may reference images not displayed]

FINDINGS: Endotracheal tube terminates 3.5 cm above the carina.

Mild patchy perihilar/right lower lobe opacities, reflecting mild
interstitial edema, improved. Possible trace left pleural effusion.
No pneumothorax.

The heart is normal in size.

Enteric tube courses below the diaphragm.
IMPRESSION: Endotracheal tube terminates 3.5 cm above the carina.

Suspected improving mild interstitial edema.

## 2013-08-10 MED ORDER — LIP MEDEX EX OINT
1.0000 "application " | TOPICAL_OINTMENT | Freq: Two times a day (BID) | CUTANEOUS | Status: DC
  Administered 2013-08-10 – 2013-08-11 (×4): 1 via TOPICAL
  Filled 2013-08-10: qty 7

## 2013-08-10 MED ORDER — MAGIC MOUTHWASH
15.0000 mL | Freq: Four times a day (QID) | ORAL | Status: DC | PRN
  Filled 2013-08-10: qty 15

## 2013-08-10 MED ORDER — ALUM & MAG HYDROXIDE-SIMETH 200-200-20 MG/5ML PO SUSP: 30.0000 mL | Freq: Four times a day (QID) | ORAL | Status: DC | PRN

## 2013-08-10 MED ORDER — DIPHENHYDRAMINE HCL 50 MG/ML IJ SOLN
12.5000 mg | Freq: Four times a day (QID) | INTRAMUSCULAR | Status: DC | PRN
  Administered 2013-08-11 (×2): 12.5 mg via INTRAVENOUS
  Filled 2013-08-10: qty 1

## 2013-08-10 MED ORDER — FUROSEMIDE 10 MG/ML IJ SOLN
40.0000 mg | Freq: Three times a day (TID) | INTRAMUSCULAR | Status: AC
  Administered 2013-08-10 (×2): 40 mg via INTRAVENOUS
  Filled 2013-08-10 (×2): qty 4

## 2013-08-10 MED ORDER — BISACODYL 10 MG RE SUPP
10.0000 mg | Freq: Every day | RECTAL | Status: DC
  Administered 2013-08-11 – 2013-08-12 (×2): 10 mg via RECTAL
  Filled 2013-08-10 (×2): qty 1

## 2013-08-10 MED ORDER — RISAQUAD PO CAPS
1.0000 | ORAL_CAPSULE | Freq: Every day | ORAL | Status: DC
  Administered 2013-08-10 – 2013-08-12 (×3): 1 via ORAL
  Filled 2013-08-10 (×3): qty 1

## 2013-08-10 MED FILL — Medication: Qty: 1 | Status: AC

## 2013-08-10 NOTE — Progress Notes (Signed)
Wasted in sink 100 ml Fentanyl  70mcg/ml in 250 ml infusion. Witnessed by Sandie Ano, RN.

## 2013-08-10 NOTE — Progress Notes (Signed)
PULMONARY  / CRITICAL CARE MEDICINE  Name: [name] [name] MRN: [id number] DOB: [birth year]    ADMISSION DATE:  08/06/2013 CONSULTATION DATE:  08/09/13  REFERRING MD :  [facility] - Dr. Biagio Quint PRIMARY SERVICE: [facility]  CHIEF COMPLAINT:  Acute Respiratory Failure  BRIEF PATIENT DESCRIPTION: 79 y/o M admitted 9/17 with diffuse abdominal pain, N/V.  Found to have SBO on CT ABD.  Medically managed per CCS.  Decompensated 9/20 from a respiratory standpoint requiring intubation.  PCCM consulted for ICU mgmt.  SIGNIFICANT EVENTS / STUDIES:  9/17 - Admit per CCS for SBO.  CT ABD >>concerning for SBO 9/20 - Decompensated on floor with resp distress, intubated.  Tx to ICU  LINES / TUBES: OETT 9/20>>> PIVs Foley 9/20  CULTURES: BCx2 9/20>>> Sputum 9/20>>>  ANTIBIOTICS: NONE  SUBJECTIVE:  Improved. VC 2400 on Ps 5/5.   VITAL SIGNS: Temp:  [97.5 F (36.4 C)-99.2 F (37.3 C)] 97.5 F (36.4 C) (09/21 0035) Pulse Rate:  [56-106] 61 (09/21 0630) Resp:  [14-27] 15 (09/21 0630) BP: (81-171)/(43-122) 108/52 mmHg (09/21 0630) SpO2:  [91 %-97 %] 94 % (09/21 0630) FiO2 (%):  [40 %-100 %] 40 % (09/21 0344)  HEMODYNAMICS: CV stable    VENTILATOR SETTINGS: Vent Mode:  [-] PRVC FiO2 (%):  [40 %-100 %] 40 % Set Rate:  [15 bmp] 15 bmp Vt Set:  [550 mL] 550 mL PEEP:  [8 cmH20-10 cmH20] 8 cmH20 Plateau Pressure:  [17 cmH20-31 cmH20] 17 cmH20  INTAKE / OUTPUT: Intake/Output     09/20 0701 - 09/21 0700 09/21 0701 - 09/22 0700   P.O. 480    I.V. (mL/kg) 475 (7)    Other 230    NG/GT 120    Total Intake(mL/kg) 1305 (19.3)    Urine (mL/kg/hr) 4200 (2.6)    Emesis/NG output 400 (0.2)    Total Output 4600     Net -3295           Diuresed well  PHYSICAL EXAMINATION: General:  wdwn elderly male in NAD on vent Neuro: sedate on vent HEENT:  Mm pink/moist, OETT / NGT in place Cardiovascular:  s1s2 rrr, distant tones due to adventitious breath sounds Lungs: improved BS Abdomen:  Distended, slightly  firm, decreased BS Musculoskeletal:  No acute deformities Skin:  Warm/dry, no edema  LABS:  CBC Recent Labs     08/08/13  0400  08/10/13  0100  WBC  8.3  14.3*  HGB  13.5  15.2  HCT  41.2  46.5  PLT  147*  180   Coag's No results found for this basename: APTT, INR,  in the last 72 hours BMET Recent Labs     08/08/13  0400  08/09/13  0621  08/10/13  0100  NA  142  143  139  K  3.6  4.2  4.0  CL  106  111  103  CO2  25  24  26   BUN  18  17  17   CREATININE  1.31  1.22  1.53*  GLUCOSE  70  98  119*   Electrolytes Recent Labs     08/08/13  0400  08/09/13  0621  08/10/13  0100  CALCIUM  8.7  8.4  9.0   Sepsis Markers Recent Labs     08/09/13  1240  08/10/13  0100  PROCALCITON  <0.10  0.38   ABG Recent Labs     08/09/13  1315  PHART  7.272*  PCO2ART  47.3*  PO2ART  102.0*   Liver Enzymes No results found for this basename: AST, ALT, ALKPHOS, BILITOT, ALBUMIN,  in the last 72 hours Cardiac Enzymes Recent Labs     08/09/13  1240  08/09/13  1836  08/10/13  0100  TROPONINI  <0.30  <0.30  <0.30   Glucose Recent Labs     08/09/13  1634  08/10/13  0041  GLUCAP  110*  103*    Imaging Dg Chest Port 1 View  08/10/2013   CLINICAL DATA:  Check ETT  EXAM: PORTABLE CHEST - 1 VIEW  COMPARISON:  08/09/2013  FINDINGS: Endotracheal tube terminates 3.5 cm above the carina.  Mild patchy perihilar/right lower lobe opacities, reflecting mild interstitial edema, improved. Possible trace left pleural effusion. No pneumothorax.  The heart is normal in size.  Enteric tube courses below the diaphragm.  IMPRESSION: Endotracheal tube terminates 3.5 cm above the carina.  Suspected improving mild interstitial edema.   Electronically Signed   By: Charline Bills M.D.   On: 08/10/2013 07:36   Dg Chest Port 1 View  08/09/2013   CLINICAL DATA:  Hypoxia with endotracheal tube repositioning  EXAM: PORTABLE CHEST - 1 VIEW  COMPARISON:  Study obtained earlier in the day  FINDINGS:  The endotracheal tube tip is 3.7 cm above the carina. No pneumothorax. There remains moderate interstitial and alveolar edema, less than earlier in the day. No new opacity. The heart size and pulmonary vascularity are normal. No apparent adenopathy.  IMPRESSION: Endotracheal tube tip 3.7 cm above the carina. No pneumothorax. Moderate edema, less than earlier in the day. No new opacity.   Electronically Signed   By: Bretta Bang   On: 08/09/2013 17:10   Dg Chest Port 1 View  08/09/2013   CLINICAL DATA:  Respiratory distress.  EXAM: PORTABLE CHEST - 1 VIEW  COMPARISON:  Of 08/09/2013  FINDINGS: Endotracheal tube tip 6 cm above the carina. Bilateral interstitial and airspace opacities minimally worsened compared to the earlier exam. Underlying emphysema is present. No cardiomegaly.  IMPRESSION: 1. Mildly worsened perihilar airspace opacity and diffuse interstitial opacity. Appearance may reflect ARDS, atypical pneumonia, or noncardiogenic edema superimposed on underlying emphysema.   Electronically Signed   By: Herbie Baltimore   On: 08/09/2013 12:35   Dg Chest Port 1 View  08/09/2013   CLINICAL DATA:  Shortness of breath.  EXAM: PORTABLE CHEST - 1 VIEW  COMPARISON:  08/06/2013  FINDINGS: Nasogastric tube extends down into the stomach.  Bilateral interstitial and right greater than left perihilar airspace opacities are present, significantly increased compared to 08/06/2013. Heart size remains within normal limits.  Emphysema noted.  IMPRESSION: 1. Diffuse interstitial opacity with perihilar airspace opacities, right greater than left, with top differential diagnostic considerations including atypical pneumonia and acute noncardiogenic pulmonary edema. 2. Emphysema.   Electronically Signed   By: Herbie Baltimore   On: 08/09/2013 11:48    ASSESSMENT / PLAN:  PULMONARY A: Acute Respiratory Failure - in setting of pulmonary edema.  Lower lungs on CT ABD were essentially clear on admit.  Acute Pulmonary  Edema >>improved 9/21 Acute Hypoxemia  Doubt VTE but note D Dimer elevated 3.0   Venous doppler pending Doubt infection P:   -push to extubate 9/21 AM -diurese further   CARDIOVASCULAR A:  Tachycardia  Neg cardiac enzymes CAD - calcifications noted on CT abd P:  -assess ECHO Diurese further   RENAL A:  Mild elevation SCr Mild elevation in lactic acid P:   -follow sr cr  with diuresis - KVO ivf -monitory lytes with NGT  GASTROINTESTINAL A:   SBO - verified with CT Abd P:   -NGT to suction  -bowel rest -maintain electrolytes -PPI  HEMATOLOGIC A:   DVT Proph  P:  -Heparin for DVT  INFECTIOUS A:   Low clinical suspicion for acute infectious etiology PCT normal P:   -assess PCT>>>normal -assess blood cultures, sputum  -hold ABX for now  ENDOCRINE A:   Hyperglycemia - no hx of DM  P:   -monitor glucose -q6 CBG's while NPO  NEUROLOGIC A:   Acute Encephalopathy now improved 9/21 P:   -hold fentanyl drip    I have personally obtained a history, examined the patient, evaluated laboratory and imaging results, formulated the assessment and plan and placed orders.  Todays summary: Acute onset pulmonary edema on an 82yoM admitted with SBO on surgery service. Now improved 9/21 with neg cardiac enzymes.  Plan PSV 5/5 and poss extubation 9/21.   CRITICAL CARE: The patient is critically ill with multiple organ systems failure and requires high complexity decision making for assessment and support, frequent evaluation and titration of therapies, application of advanced monitoring technologies and extensive interpretation of multiple databases. Critical Care Time devoted to patient care services described in this note is 35 minutes.   Dorcas Carrow Beeper  [phone]  Cell  [phone]  If no response or cell goes to voicemail, call beeper [phone]  08/10/2013, 8:00 AM

## 2013-08-10 NOTE — Progress Notes (Signed)
Pt up to chair 3.5 hours. Continues to require 4L [place] to maintain >90% O2 sats. Has not experienced any nausea or cramping with consumption of clear liquids. Passing gas. No residuals from NG tube. Abdomen soft with bowel sounds.

## 2013-08-10 NOTE — Procedures (Signed)
Extubation Procedure Note  Patient Details:   Name: [name] [name] DOB: 12-26-1929 MRN: [id number]   Airway Documentation:  Airway 7.5 mm (Active)  Secured at (cm) 26 cm 08/10/2013  9:12 AM  Measured From Lips 08/10/2013  9:12 AM  Secured Location Right 08/10/2013  9:12 AM  Secured By [employer] 08/10/2013  9:12 AM  Tube Holder Repositioned Yes 08/10/2013  9:12 AM  Cuff Pressure (cm H2O) 24 cm H2O 08/09/2013  8:35 PM  Site Condition Dry 08/09/2013 12:12 PM    Evaluation  O2 sats: stable throughout Complications: No apparent complications Patient did tolerate procedure well. Bilateral Breath Sounds: Clear;Diminished Suctioning: Oral Yes  Ellin Mayhew 08/10/2013, 10:53 AM

## 2013-08-10 NOTE — Progress Notes (Signed)
  Echocardiogram 2D Echocardiogram has been performed.  Arvil Chaco 08/10/2013, 9:08 AM

## 2013-08-10 NOTE — Progress Notes (Addendum)
[name] [name] [id number] 02/04/1930  CARE TEAM:  PCP: Albertina Senegal, MD  Outpatient Care Team: Patient Care Team: Albertina Senegal, MD as PCP - General (Internal Medicine)  Inpatient Treatment Team: Treatment Team: Attending Provider: Bishop Limbo, MD; Registered Nurse: Gareth Eagle, RN; Technician: Karren Burly, NT; Registered Nurse: Velda Shell, RN; Rounding Team: Md Pccm, MD; Technician: Levora Dredge, NT; Registered Nurse: Toney Sang, RN; Registered Nurse: Fenton Foy, RN   Subjective:  Intubated -3 L yesterday from 4L UOP Much more alert / strong ICU & family in room Pulm/CCM at door - planning extub trial this AM No events on vent - minimal settings   Objective:  Vital signs:  Filed Vitals:   08/10/13 0344 08/10/13 0400 08/10/13 0500 08/10/13 0630  BP: 90/49 117/93 95/60 108/52  Pulse: 61 64 60 61  Temp:      TempSrc:      Resp: 20 17 15 15   Height:      Weight:      SpO2: 97% 93% 94% 94%    Last BM Date: 08/06/13  Intake/Output   Yesterday:  09/20 0701 - 09/21 0700 In: 1305 [P.O.:480; I.V.:475; NG/GT:120] Out: 4600 [Urine:4200; Emesis/NG output:400] This shift:     Bowel function:  Flatus: y  BM: n  Drain: NGT 400 thin bilious  Physical Exam:  General: Pt awake/alert in no acute distress.   Eyes: PERRL, normal EOM.  Sclera clear.  No icterus Neuro: CN II-XII intact w/o focal sensory/motor deficits. Lymph: No head/neck/groin lymphadenopathy Psych:  No delerium/psychosis/paranoia HENT: Normocephalic, Mucus membranes moist.  ETT & NGT in place.  No thrush Neck: Supple, No tracheal deviation Chest: Coarse BS but no chest wall pain w good excursion CV:  Pulses intact.  Regular rhythm MS: Normal AROM mjr joints.  No obvious deformity Abdomen: Soft.  Mildly distended.  Nontender.  No evidence of peritonitis.  No incarcerated hernias. Ext:  SCDs BLE.  No mjr edema.  No cyanosis Skin: No petechiae / purpura   Problem  List:   Principal Problem:   Acute respiratory failure with hypoxia Active Problems:   Acute pulmonary edema   Small bowel obstruction   Assessment  [name] [name]  79 y.o. male       Improving but w many issues  Plan:  -agree w weaning trial of vent -f/u ECHO (being performed right now) -diuresis as tolerated - follow inc Cr closely  -NGT until extubated, then OK to start clears PO w NGT clamped if succeeds in extibation today -VTE prophylaxis- SCDs, etc -mobilize as tolerated to help recovery  I updated the patient's status to the pt's daughter x .  Recommendations were made.  Questions were answered.  She expressed understanding & appreciation.   Ardeth Sportsman, M.D., F.A.C.S. Gastrointestinal and Minimally Invasive Surgery [facility] [address] [place], [place] [address] [phone] Main / Paging   08/10/2013   Results:   Labs: Results for orders placed during the hospital encounter of 08/06/13 (from the past 48 hour(s))  BASIC METABOLIC PANEL     Status: Abnormal   Collection Time    08/09/13  6:21 AM      Result Value Range   Sodium 143  135 - 145 mEq/L   Potassium 4.2  3.5 - 5.1 mEq/L   Chloride 111  96 - 112 mEq/L   CO2 24  19 - 32 mEq/L   Glucose, Bld 98  70 -  99 mg/dL   BUN 17  6 - 23 mg/dL   Creatinine, Ser 1.61  0.50 - 1.35 mg/dL   Calcium 8.4  8.4 - 09.6 mg/dL   GFR calc non Af Amer 54 (*) >90 mL/min   GFR calc Af Amer 63 (*) >90 mL/min   Comment: (NOTE)     The eGFR has been calculated using the CKD EPI equation.     This calculation has not been validated in all clinical situations.     eGFR's persistently <90 mL/min signify possible Chronic Kidney     Disease.  TROPONIN I     Status: None   Collection Time    08/09/13 12:40 PM      Result Value Range   Troponin I <0.30  <0.30 ng/mL   Comment:            Due to the release kinetics of cTnI,     a negative result within the first hours     of  the onset of symptoms does not rule out     myocardial infarction with certainty.     If myocardial infarction is still suspected,     repeat the test at appropriate intervals.  LACTIC ACID, PLASMA     Status: Abnormal   Collection Time    08/09/13 12:40 PM      Result Value Range   Lactic Acid, Venous 4.0 (*) 0.5 - 2.2 mmol/L  D-DIMER, QUANTITATIVE     Status: Abnormal   Collection Time    08/09/13 12:40 PM      Result Value Range   D-Dimer, Quant 3.02 (*) 0.00 - 0.48 ug/mL-FEU   Comment:            AT THE INHOUSE ESTABLISHED CUTOFF     VALUE OF 0.48 ug/mL FEU,     THIS ASSAY HAS BEEN DOCUMENTED     IN THE LITERATURE TO HAVE     A SENSITIVITY AND NEGATIVE     PREDICTIVE VALUE OF AT LEAST     98 TO 99%.  THE TEST RESULT     SHOULD BE CORRELATED WITH     AN ASSESSMENT OF THE CLINICAL     PROBABILITY OF DVT / VTE.  PROCALCITONIN     Status: None   Collection Time    08/09/13 12:40 PM      Result Value Range   Procalcitonin <0.10     Comment:            Interpretation:     PCT (Procalcitonin) <= 0.5 ng/mL:     Systemic infection (sepsis) is not likely.     Local bacterial infection is possible.     (NOTE)             ICU PCT Algorithm               Non ICU PCT Algorithm        ----------------------------     ------------------------------             PCT < 0.25 ng/mL                 PCT < 0.1 ng/mL         Stopping of antibiotics            Stopping of antibiotics           strongly encouraged.               strongly encouraged.        ----------------------------     ------------------------------  PCT level decrease by               PCT < 0.25 ng/mL           >= 80% from peak PCT           OR PCT 0.25 - 0.5 ng/mL          Stopping of antibiotics                                                 encouraged.         Stopping of antibiotics               encouraged.        ----------------------------     ------------------------------           PCT level decrease  by              PCT >= 0.25 ng/mL           < 80% from peak PCT            AND PCT >= 0.5 ng/mL            Continuing antibiotics                                                  encouraged.           Continuing antibiotics                encouraged.        ----------------------------     ------------------------------         PCT level increase compared          PCT > 0.5 ng/mL             with peak PCT AND              PCT >= 0.5 ng/mL             Escalation of antibiotics                                              strongly encouraged.          Escalation of antibiotics            strongly encouraged.  BLOOD GAS, ARTERIAL     Status: Abnormal   Collection Time    08/09/13  1:15 PM      Result Value Range   FIO2 1.00     Delivery systems VENTILATOR     Mode PRESSURE REGULATED VOLUME CONTROL     VT 550     Rate 15     Peep/cpap 10.0     pH, Arterial 7.272 (*) 7.350 - 7.450   pCO2 arterial 47.3 (*) 35.0 - 45.0 mmHg   pO2, Arterial 102.0 (*) 80.0 - 100.0 mmHg   Bicarbonate 21.1  20.0 - 24.0 mEq/L   TCO2 18.6  0 - 100 mmol/L   Acid-base deficit 5.7 (*) 0.0 - 2.0 mmol/L   O2 Saturation 96.3  Patient temperature 98.6     Collection site RIGHT RADIAL     Drawn by [phone]     Sample type ARTERIAL DRAW     Allens test (pass/fail) PASS  PASS  GLUCOSE, CAPILLARY     Status: Abnormal   Collection Time    08/09/13  4:34 PM      Result Value Range   Glucose-Capillary 110 (*) 70 - 99 mg/dL  TROPONIN I     Status: None   Collection Time    08/09/13  6:36 PM      Result Value Range   Troponin I <0.30  <0.30 ng/mL   Comment:            Due to the release kinetics of cTnI,     a negative result within the first hours     of the onset of symptoms does not rule out     myocardial infarction with certainty.     If myocardial infarction is still suspected,     repeat the test at appropriate intervals.  MRSA PCR SCREENING     Status: None   Collection Time    08/09/13  7:10 PM       Result Value Range   MRSA by PCR NEGATIVE  NEGATIVE   Comment:            The GeneXpert MRSA Assay (FDA     approved for NASAL specimens     only), is one component of a     comprehensive MRSA colonization     surveillance program. It is not     intended to diagnose MRSA     infection nor to guide or     monitor treatment for     MRSA infections.  GLUCOSE, CAPILLARY     Status: Abnormal   Collection Time    08/10/13 12:41 AM      Result Value Range   Glucose-Capillary 103 (*) 70 - 99 mg/dL   Comment 1 Notify RN    TROPONIN I     Status: None   Collection Time    08/10/13  1:00 AM      Result Value Range   Troponin I <0.30  <0.30 ng/mL   Comment:            Due to the release kinetics of cTnI,     a negative result within the first hours     of the onset of symptoms does not rule out     myocardial infarction with certainty.     If myocardial infarction is still suspected,     repeat the test at appropriate intervals.  PROCALCITONIN     Status: None   Collection Time    08/10/13  1:00 AM      Result Value Range   Procalcitonin 0.38     Comment:            Interpretation:     PCT (Procalcitonin) <= 0.5 ng/mL:     Systemic infection (sepsis) is not likely.     Local bacterial infection is possible.     (NOTE)             ICU PCT Algorithm               Non ICU PCT Algorithm        ----------------------------     ------------------------------             PCT < 0.25 ng/mL  PCT < 0.1 ng/mL         Stopping of antibiotics            Stopping of antibiotics           strongly encouraged.               strongly encouraged.        ----------------------------     ------------------------------           PCT level decrease by               PCT < 0.25 ng/mL           >= 80% from peak PCT           OR PCT 0.25 - 0.5 ng/mL          Stopping of antibiotics                                                 encouraged.         Stopping of antibiotics                encouraged.        ----------------------------     ------------------------------           PCT level decrease by              PCT >= 0.25 ng/mL           < 80% from peak PCT            AND PCT >= 0.5 ng/mL            Continuing antibiotics                                                  encouraged.           Continuing antibiotics                encouraged.        ----------------------------     ------------------------------         PCT level increase compared          PCT > 0.5 ng/mL             with peak PCT AND              PCT >= 0.5 ng/mL             Escalation of antibiotics                                              strongly encouraged.          Escalation of antibiotics            strongly encouraged.  BASIC METABOLIC PANEL     Status: Abnormal   Collection Time    08/10/13  1:00 AM      Result Value Range   Sodium 139  135 - 145 mEq/L   Potassium 4.0  3.5 - 5.1 mEq/L   Chloride 103  96 - 112  mEq/L   CO2 26  19 - 32 mEq/L   Glucose, Bld 119 (*) 70 - 99 mg/dL   BUN 17  6 - 23 mg/dL   Creatinine, Ser 1.61 (*) 0.50 - 1.35 mg/dL   Calcium 9.0  8.4 - 09.6 mg/dL   GFR calc non Af Amer 41 (*) >90 mL/min   GFR calc Af Amer 48 (*) >90 mL/min   Comment: (NOTE)     The eGFR has been calculated using the CKD EPI equation.     This calculation has not been validated in all clinical situations.     eGFR's persistently <90 mL/min signify possible Chronic Kidney     Disease.  CBC     Status: Abnormal   Collection Time    08/10/13  1:00 AM      Result Value Range   WBC 14.3 (*) 4.0 - 10.5 K/uL   RBC 5.25  4.22 - 5.81 MIL/uL   Hemoglobin 15.2  13.0 - 17.0 g/dL   HCT 04.5  40.9 - 81.1 %   MCV 88.6  78.0 - 100.0 fL   MCH 29.0  26.0 - 34.0 pg   MCHC 32.7  30.0 - 36.0 g/dL   RDW 91.4  78.2 - 95.6 %   Platelets 180  150 - 400 K/uL    Imaging / Studies: Dg Chest Port 1 View  08/10/2013   CLINICAL DATA:  Check ETT  EXAM: PORTABLE CHEST - 1 VIEW  COMPARISON:  08/09/2013   FINDINGS: Endotracheal tube terminates 3.5 cm above the carina.  Mild patchy perihilar/right lower lobe opacities, reflecting mild interstitial edema, improved. Possible trace left pleural effusion. No pneumothorax.  The heart is normal in size.  Enteric tube courses below the diaphragm.  IMPRESSION: Endotracheal tube terminates 3.5 cm above the carina.  Suspected improving mild interstitial edema.   Electronically Signed   By: Charline Bills M.D.   On: 08/10/2013 07:36   Dg Chest Port 1 View  08/09/2013   CLINICAL DATA:  Hypoxia with endotracheal tube repositioning  EXAM: PORTABLE CHEST - 1 VIEW  COMPARISON:  Study obtained earlier in the day  FINDINGS: The endotracheal tube tip is 3.7 cm above the carina. No pneumothorax. There remains moderate interstitial and alveolar edema, less than earlier in the day. No new opacity. The heart size and pulmonary vascularity are normal. No apparent adenopathy.  IMPRESSION: Endotracheal tube tip 3.7 cm above the carina. No pneumothorax. Moderate edema, less than earlier in the day. No new opacity.   Electronically Signed   By: Bretta Bang   On: 08/09/2013 17:10   Dg Chest Port 1 View  08/09/2013   CLINICAL DATA:  Respiratory distress.  EXAM: PORTABLE CHEST - 1 VIEW  COMPARISON:  Of 08/09/2013  FINDINGS: Endotracheal tube tip 6 cm above the carina. Bilateral interstitial and airspace opacities minimally worsened compared to the earlier exam. Underlying emphysema is present. No cardiomegaly.  IMPRESSION: 1. Mildly worsened perihilar airspace opacity and diffuse interstitial opacity. Appearance may reflect ARDS, atypical pneumonia, or noncardiogenic edema superimposed on underlying emphysema.   Electronically Signed   By: Herbie Baltimore   On: 08/09/2013 12:35   Dg Chest Port 1 View  08/09/2013   CLINICAL DATA:  Shortness of breath.  EXAM: PORTABLE CHEST - 1 VIEW  COMPARISON:  08/06/2013  FINDINGS: Nasogastric tube extends down into the stomach.  Bilateral  interstitial and right greater than left perihilar airspace opacities are present, significantly increased compared to 08/06/2013. Heart  size remains within normal limits.  Emphysema noted.  IMPRESSION: 1. Diffuse interstitial opacity with perihilar airspace opacities, right greater than left, with top differential diagnostic considerations including atypical pneumonia and acute noncardiogenic pulmonary edema. 2. Emphysema.   Electronically Signed   By: Herbie Baltimore   On: 08/09/2013 11:48    Medications / Allergies: per chart  Antibiotics: Anti-infectives   None

## 2013-08-11 ENCOUNTER — Encounter (HOSPITAL_COMMUNITY): Payer: Self-pay | Admitting: Nurse Practitioner

## 2013-08-11 ENCOUNTER — Inpatient Hospital Stay (HOSPITAL_COMMUNITY): Payer: Medicare Other

## 2013-08-11 DIAGNOSIS — I5021 Acute systolic (congestive) heart failure: Secondary | ICD-10-CM | POA: Diagnosis not present

## 2013-08-11 DIAGNOSIS — E876 Hypokalemia: Secondary | ICD-10-CM

## 2013-08-11 DIAGNOSIS — N183 Chronic kidney disease, stage 3 unspecified: Secondary | ICD-10-CM

## 2013-08-11 DIAGNOSIS — I509 Heart failure, unspecified: Secondary | ICD-10-CM

## 2013-08-11 LAB — BASIC METABOLIC PANEL
BUN: 22 mg/dL (ref 6–23)
CO2: 29 mEq/L (ref 19–32)
Calcium: 9.1 mg/dL (ref 8.4–10.5)
Chloride: 97 mEq/L (ref 96–112)
Creatinine, Ser: 1.63 mg/dL — ABNORMAL HIGH (ref 0.50–1.35)
GFR calc Af Amer: 44 mL/min — ABNORMAL LOW (ref >90)
GFR calc non Af Amer: 38 mL/min — ABNORMAL LOW (ref >90)
Glucose, Bld: 113 mg/dL — ABNORMAL HIGH (ref 70–99)
Potassium: 3.3 mEq/L — ABNORMAL LOW (ref 3.5–5.1)
Sodium: 138 mEq/L (ref 135–145)

## 2013-08-11 LAB — CBC
HCT: 43.1 % (ref 39.0–52.0)
Hemoglobin: 14.5 g/dL (ref 13.0–17.0)
MCH: 29.2 pg (ref 26.0–34.0)
MCHC: 33.6 g/dL (ref 30.0–36.0)
MCV: 86.9 fL (ref 78.0–100.0)
Platelets: 158 10*3/uL (ref 150–400)
RBC: 4.96 MIL/uL (ref 4.22–5.81)
RDW: 14.6 % (ref 11.5–15.5)
WBC: 9.8 10*3/uL (ref 4.0–10.5)

## 2013-08-11 LAB — GLUCOSE, CAPILLARY
Glucose-Capillary: 101 mg/dL — ABNORMAL HIGH (ref 70–99)
Glucose-Capillary: 127 mg/dL — ABNORMAL HIGH (ref 70–99)
Glucose-Capillary: 142 mg/dL — ABNORMAL HIGH (ref 70–99)
Glucose-Capillary: 96 mg/dL (ref 70–99)

## 2013-08-11 LAB — PROCALCITONIN: Procalcitonin: 0.17 ng/mL

## 2013-08-11 IMAGING — CR DG CHEST 1V PORT
1 series · 1 of 1 positions shown · non-contrast
Comparison: [DATE]

CLINICAL DATA: Edema

EXAM:
PORTABLE CHEST - 1 VIEW

[AP]
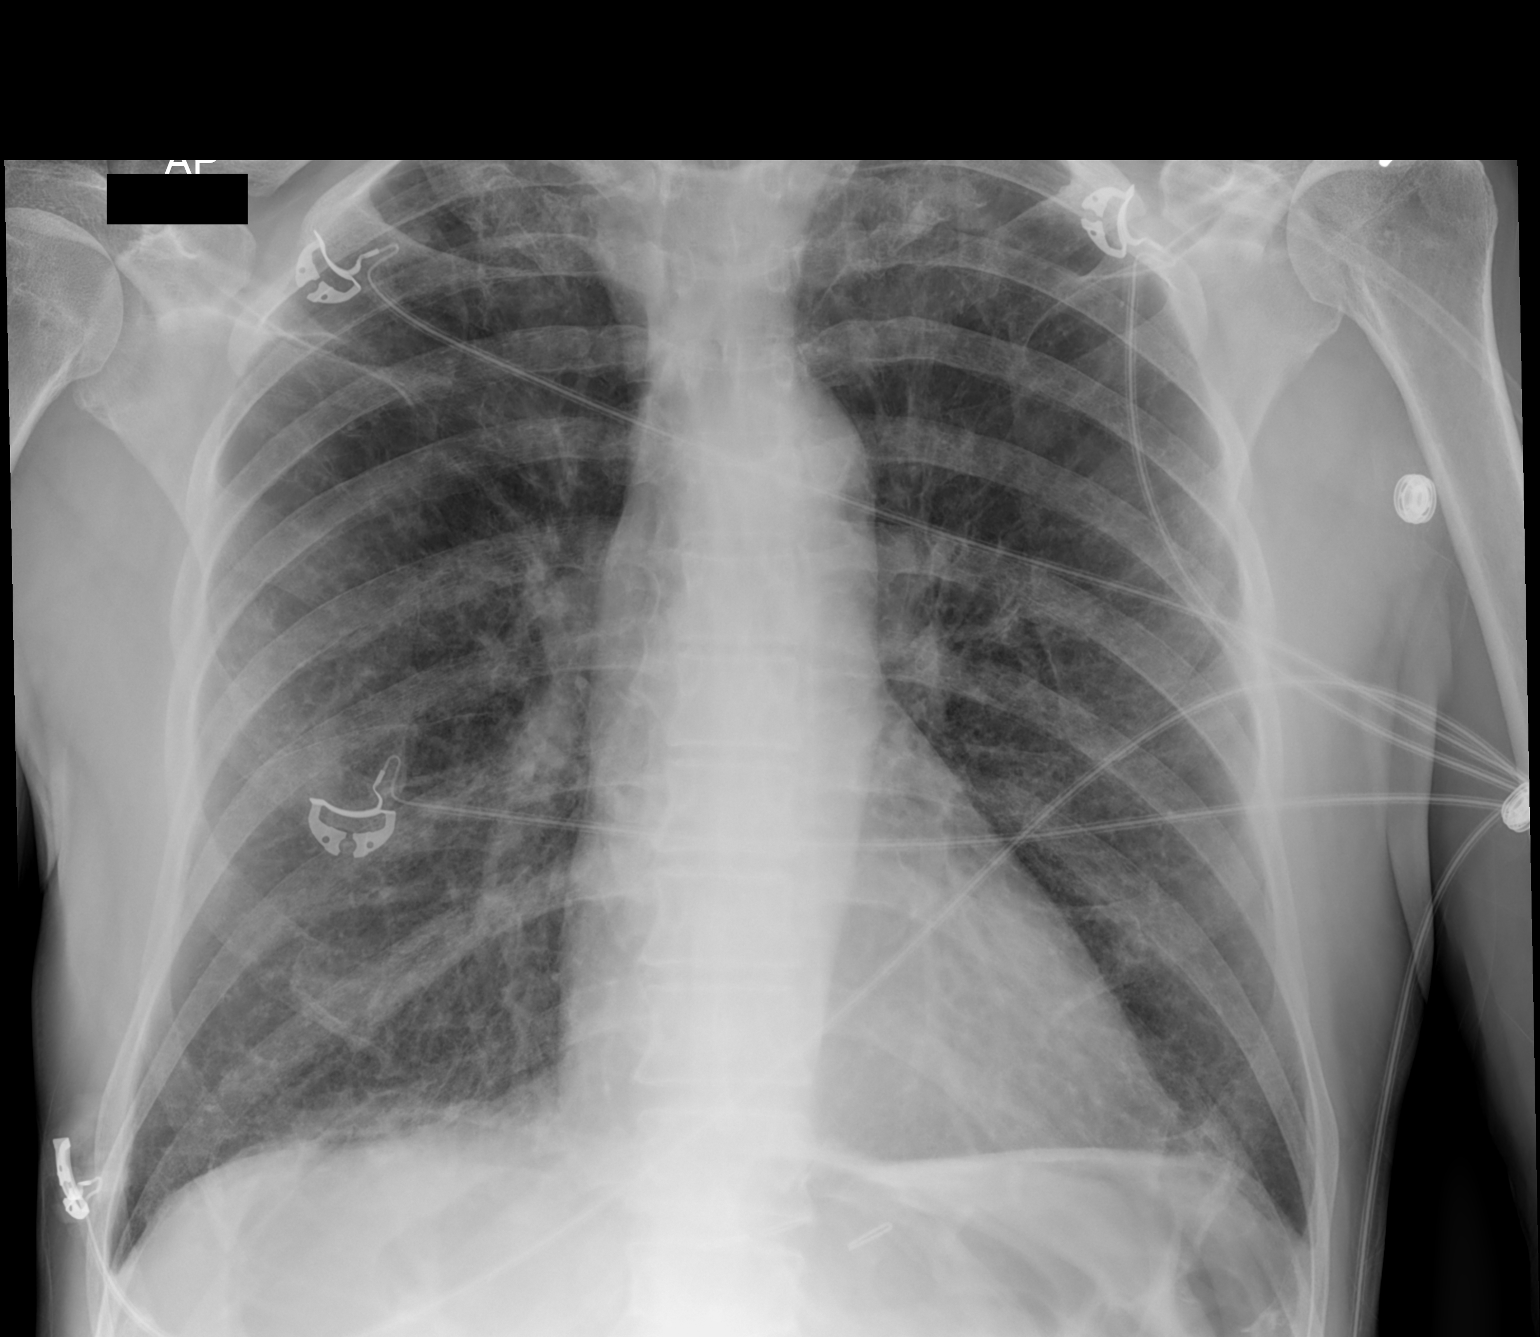

[1 of 1 positions shown; findings below may reference images not displayed]

FINDINGS: Interval tracheal and esophageal extubation. Decreasing interstitial
opacities, with residual coarse interstitium possibly the patient's
background. Hyperinflated lungs. Normal heart size and mediastinal
contours.
IMPRESSION: Continued improvement in pulmonary edema.

## 2013-08-11 MED ORDER — CARVEDILOL 3.125 MG PO TABS
3.1250 mg | ORAL_TABLET | Freq: Two times a day (BID) | ORAL | Status: DC
  Administered 2013-08-11 – 2013-08-12 (×2): 3.125 mg via ORAL
  Filled 2013-08-11 (×4): qty 1

## 2013-08-11 MED ORDER — POTASSIUM CHLORIDE CRYS ER 20 MEQ PO TBCR
20.0000 meq | EXTENDED_RELEASE_TABLET | Freq: Every day | ORAL | Status: DC
  Administered 2013-08-11 – 2013-08-12 (×2): 20 meq via ORAL
  Filled 2013-08-11 (×2): qty 1

## 2013-08-11 NOTE — Progress Notes (Signed)
PULMONARY  / CRITICAL CARE MEDICINE  Name: [name] [name] MRN: [id number] DOB: 1930/01/04    ADMISSION DATE:  08/06/2013 CONSULTATION DATE:  08/09/13  REFERRING MD :  [facility] - Dr. Biagio Quint PRIMARY SERVICE: [facility]  CHIEF COMPLAINT:  Acute Respiratory Failure  BRIEF PATIENT DESCRIPTION: 79 y/o M admitted 9/17 with diffuse abdominal pain, N/V.  Found to have SBO on CT ABD.  Medically managed per [facility].  Decompensated 9/20 from a respiratory standpoint requiring intubation.  PCCM consulted for ICU mgmt.  SIGNIFICANT EVENTS / STUDIES:  9/17 - Admit per [facility] for SBO.  CT ABD >>concerning for SBO 9/20 - Decompensated on floor with resp distress, intubated.  Tx to ICU 9/20 - doppler legs >> no DVT 9/21 - Echo >>  EF 35 to 40%, mod AR  LINES / TUBES: OETT 9/20>>> PIVs Foley 9/20  CULTURES: BCx2 9/20>>> Sputum 9/20>>>  ANTIBIOTICS: NONE  SUBJECTIVE:  C/o sore throat.  Breathing better.  Denies chest pain.  VITAL SIGNS: Temp:  [97.1 F (36.2 C)-98.8 F (37.1 C)] 97.9 F (36.6 C) (09/22 0800) Pulse Rate:  [69-80] 69 (09/21 2000) Resp:  [12-20] 17 (09/22 0800) BP: (99-150)/(49-94) 129/94 mmHg (09/22 0800) SpO2:  [93 %-96 %] 95 % (09/22 0800) 2 liters [place]  INTAKE / OUTPUT: Intake/Output     09/21 0701 - 09/22 0700 09/22 0701 - 09/23 0700   P.O. 860 660   I.V. (mL/kg) 425 (6.3) 30 (0.4)   Other 20    NG/GT     Total Intake(mL/kg) 1305 (19.3) 690 (10.2)   Urine (mL/kg/hr) 3400 (2.1)    Emesis/NG output 120 (0.1)    Total Output 3520     Net -2215 +690        Urine Occurrence 1 x    Stool Occurrence 1 x     PHYSICAL EXAMINATION: General: No distress Neuro: alert, normal strength HEENT: no sinus tenderness Cardiovascular: regular, 2/6 SM Lungs: no wheeze Abdomen: soft Musculoskeletal:  No edema Skin: rashes  LABS:  CBC Recent Labs     08/10/13  0100  08/11/13  0330  WBC  14.3*  9.8  HGB  15.2  14.5  HCT  46.5  43.1  PLT  180  158   Coag's No results found for this  basename: APTT, INR,  in the last 72 hours  BMET Recent Labs     08/09/13  0621  08/10/13  0100  08/11/13  0330  NA  143  139  138  K  4.2  4.0  3.3*  CL  111  103  97  CO2  24  26  29   BUN  17  17  22   CREATININE  1.22  1.53*  1.63*  GLUCOSE  98  119*  113*   Electrolytes Recent Labs     08/09/13  0621  08/10/13  0100  08/11/13  0330  CALCIUM  8.4  9.0  9.1   Sepsis Markers Recent Labs     08/09/13  1240  08/10/13  0100  08/11/13  0330  PROCALCITON  <0.10  0.38  0.17   ABG Recent Labs     08/09/13  1315  PHART  7.272*  PCO2ART  47.3*  PO2ART  102.0*   Liver Enzymes No results found for this basename: AST, ALT, ALKPHOS, BILITOT, ALBUMIN,  in the last 72 hours  Cardiac Enzymes Recent Labs     08/09/13  1240  08/09/13  1836  08/10/13  0100  TROPONINI  <  0.30  <0.30  <0.30   Glucose Recent Labs     08/10/13  0041  08/10/13  0624  08/10/13  0852  08/10/13  1313  08/11/13  0005  08/11/13  0624  GLUCAP  103*  98  93  111*  127*  101*    Imaging Dg Chest Port 1 View  08/11/2013   CLINICAL DATA:  Edema  EXAM: PORTABLE CHEST - 1 VIEW  COMPARISON:  08/10/2013  FINDINGS: Interval tracheal and esophageal extubation. Decreasing interstitial opacities, with residual coarse interstitium possibly the patient's background. Hyperinflated lungs. Normal heart size and mediastinal contours.  IMPRESSION: Continued improvement in pulmonary edema.   Electronically Signed   By: Tiburcio Pea   On: 08/11/2013 05:39   Dg Chest Port 1 View  08/10/2013   CLINICAL DATA:  Check ETT  EXAM: PORTABLE CHEST - 1 VIEW  COMPARISON:  08/09/2013  FINDINGS: Endotracheal tube terminates 3.5 cm above the carina.  Mild patchy perihilar/right lower lobe opacities, reflecting mild interstitial edema, improved. Possible trace left pleural effusion. No pneumothorax.  The heart is normal in size.  Enteric tube courses below the diaphragm.  IMPRESSION: Endotracheal tube terminates 3.5 cm above the  carina.  Suspected improving mild interstitial edema.   Electronically Signed   By: Charline Bills M.D.   On: 08/10/2013 07:36   Dg Chest Port 1 View  08/09/2013   CLINICAL DATA:  Hypoxia with endotracheal tube repositioning  EXAM: PORTABLE CHEST - 1 VIEW  COMPARISON:  Study obtained earlier in the day  FINDINGS: The endotracheal tube tip is 3.7 cm above the carina. No pneumothorax. There remains moderate interstitial and alveolar edema, less than earlier in the day. No new opacity. The heart size and pulmonary vascularity are normal. No apparent adenopathy.  IMPRESSION: Endotracheal tube tip 3.7 cm above the carina. No pneumothorax. Moderate edema, less than earlier in the day. No new opacity.   Electronically Signed   By: Bretta Bang   On: 08/09/2013 17:10   Dg Chest Port 1 View  08/09/2013   CLINICAL DATA:  Respiratory distress.  EXAM: PORTABLE CHEST - 1 VIEW  COMPARISON:  Of 08/09/2013  FINDINGS: Endotracheal tube tip 6 cm above the carina. Bilateral interstitial and airspace opacities minimally worsened compared to the earlier exam. Underlying emphysema is present. No cardiomegaly.  IMPRESSION: 1. Mildly worsened perihilar airspace opacity and diffuse interstitial opacity. Appearance may reflect ARDS, atypical pneumonia, or noncardiogenic edema superimposed on underlying emphysema.   Electronically Signed   By: Herbie Baltimore   On: 08/09/2013 12:35   Dg Chest Port 1 View  08/09/2013   CLINICAL DATA:  Shortness of breath.  EXAM: PORTABLE CHEST - 1 VIEW  COMPARISON:  08/06/2013  FINDINGS: Nasogastric tube extends down into the stomach.  Bilateral interstitial and right greater than left perihilar airspace opacities are present, significantly increased compared to 08/06/2013. Heart size remains within normal limits.  Emphysema noted.  IMPRESSION: 1. Diffuse interstitial opacity with perihilar airspace opacities, right greater than left, with top differential diagnostic considerations including  atypical pneumonia and acute noncardiogenic pulmonary edema. 2. Emphysema.   Electronically Signed   By: Herbie Baltimore   On: 08/09/2013 11:48    ASSESSMENT / PLAN:  PULMONARY A: Acute Respiratory Failure 2nd to acute pulmonary edema >> resolved. P:   -oxygen as needed to keep SpO2 > 92% -bronchial hygiene -f/u CXR as needed  CARDIOVASCULAR A:  Acute systolic heart failure with acute pulmonary edema? Cause >> coronary  calcification noted on CT abd. P:  -recommend cardiology evaluation prior to d/c home -goal even to negative fluid balance  RENAL A:   Stage 3 CKD. P:   -monitor renal fx, urine outpt, electrolytes  GASTROINTESTINAL A:   SBO. Nutrition. P:   -per [facility]  HEMATOLOGIC A:   No acute issues. P:  -SQ Heparin for DVT  INFECTIOUS A:   No evidence for infection. P:   -monitor clincially  ENDOCRINE A:   Hyperglycemia - no hx of DM  P:   -monitor glucose -q6 CBG's while NPO  NEUROLOGIC A:   Acute Encephalopathy 2nd to hypoxia >> resolved. P:   -monitor clinically  Summary: He has acute systolic heart failure with coronary calcification noted on CT abd.  Respiratory failure resolved with diuresis.  He needs cardiology evaluation prior to d/c home.    Okay to transfer to telemetry.  PCCM will sign off.  Updated family at bedside about plan.  Coralyn Helling, MD [facility] Pulmonary/Critical Care 08/11/2013, 10:13 AM Pager:  [phone] After 3pm call: [phone]

## 2013-08-11 NOTE — Progress Notes (Signed)
  CARE MANAGEMENT NOTE 08/11/2013  Patient:  [name] [name], [name] [name]   Account Number:  [id number]  Date Initiated:  08/11/2013  Documentation initiated by:  DAVIS,RHONDA  Subjective/Objective Assessment:   pt admitted with sbo and having cardiac arrthymias, recent hx of bowel surg.     Action/Plan:   home when stable   Anticipated DC Date:  08/14/2013   Anticipated DC Plan:  HOME/SELF CARE         Choice offered to / List presented to:             Status of service:  In process, will continue to follow Medicare Important Message given?   (If response is "NO", the following Medicare IM given date fields will be blank) Date Medicare IM given:   Date Additional Medicare IM given:    Discharge Disposition:    Per UR Regulation:  Reviewed for med. necessity/level of care/duration of stay  If discussed at Long Length of Stay Meetings, dates discussed:    Comments:  [id number]/Rhonda Stark Jock, BSN, [place] [phone] Chart Reviewed for discharge and hospital needs.  Patient transferred to telem [id number]/po meds switched Discharge needs at time of review:  None Review of patient progress due on [id number].

## 2013-08-11 NOTE — Consult Note (Signed)
CARDIOLOGY CONSULT NOTE  [name] MRN: [id number], DOB/AGE: 07-21-1930   Admit date: 08/06/2013 Date of Consult: 08/11/2013   Primary Physician: Albertina Senegal, MD Primary Cardiologist: new - seen by J. Ozan Maclay, MD - lives in [place]  Pt. Profile  79 year old male without prior cardiac history who was admitted with small bowel obstruction and developed acute pulmonary edema and subsequently found to have a newly diagnosed cardiomyopathy.  Problem List  Past Medical History  Diagnosis Date  . Small bowel perforation     a. 11/2012 s/p emergent SB resection.  . Diverticulitis   . CKD (chronic kidney disease), stage III     Past Surgical History  Procedure Laterality Date  . Cholecystectomy    . Inguinal hernia repair       Allergies  No Known Allergies  HPI   79 year old male without prior cardiac history. He reports being very active at home without any history of chest pain. He is noted on 2 occasions dyspnea on exertion while mowing his lawn this hasn't happened since May of this year. Patient says he is typically very healthy with the exception of a few admissions for diverticulitis and subsequently small bowel perforation in January 2014.  Patient was in his usual state of health until approximately 2 weeks prior to admission when he began to note bloating after meals. On the day of admission, he noted diffuse abdominal discomfort and presented to the emergency department where CT showed small bowel obstruction. He was admitted by [facility] surgery for further evaluation. With conservative care and NG tube placement, patient began to feel better. History with IV fluids and as of the morning of September 20, he was net +2.3 L for the admission. Just after lunch time on September 20, patient developed acute dyspnea and respiratory failure requiring intubation. Chest x-ray showed pulmonary edema. He was seen back on her medicine and placed on intravenous Lasix  with improved oxygenation and he was able to be extubated by September 21. A 2-D echocardiogram was carried out and revealed an EF of 35-40% with akinesis of the apical septal, apical lateral, and apical myocardium. Moderate aortic insufficiency was also noted. Despite this acute event with finding of cardiac myopathy, patient's troponins have been normal. Since extubation, he's been doing well. His breathing is much improved and remains IV Lasix. Since the 20th, he has had a net negative of 4.8 L. He denies chest pain, PND, orthopnea, dizziness, syncope, edema, or early satiety.  Inpatient Medications  . acidophilus  1 capsule Oral Daily  . bisacodyl  10 mg Rectal Daily  . heparin  5,000 Units Subcutaneous Q8H  . lip balm  1 application Topical BID  . pantoprazole (PROTONIX) IV  40 mg Intravenous QHS  . potassium chloride  20 mEq Oral Q lunch   Family History Family History  Problem Relation Age of Onset  . Emphysema Father     died @ 60  . Diabetes Mother     died @ 72    Social History History   Social History  . Marital Status: Married    Spouse Name: N/A    Number of Children: N/A  . Years of Education: N/A   Occupational History  . Not on file.   Social History Main Topics  . Smoking status: Former Smoker -- 1.00 packs/day for 45 years  . Smokeless tobacco: Never Used  . Alcohol Use: No  . Drug Use: No  . Sexual Activity: Not Currently  Other Topics Concern  . Not on file   Social History Narrative   Pt lives in [place] with his wife of 14 yrs (second wife).  Retired Radio broadcast assistant.  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  Very active @ home.  Still push mows his yard.    Review of Systems  General:  No chills, fever, night sweats or weight changes.  Cardiovascular:  No chest pain, +++ dyspnea on 9/20, no edema, orthopnea, palpitations, paroxysmal nocturnal dyspnea. Dermatological: No rash, lesions/masses Respiratory: No cough, dyspnea Urologic:  No hematuria, dysuria Abdominal:  +++ abd discomfort, n, v,  leading to admission.  No diarrhea, bright red blood per rectum, melena, or hematemesis Neurologic:  No visual changes, wkns, changes in mental status. All other systems reviewed and are otherwise negative except as noted above.  Physical Exam  Blood pressure 112/62, pulse 69, temperature 97.9 F (36.6 C), temperature source Axillary, resp. rate 17, height 5\' 9"  (1.753 m), weight 149 lb 1.6 oz (67.631 kg), SpO2 98.00%.  General: Pleasant, NAD Psych: Normal affect. Neuro: Alert and oriented X 3. Moves all extremities spontaneously. HEENT: Normal  Neck: Supple without bruits or JVD. Lungs:  Resp regular and unlabored, few crackles @ bilat bases. Heart: RRR no s3, s4, or murmurs. Abdomen: Soft, non-tender, non-distended, BS + x 4.  Extremities: No clubbing, cyanosis or edema. DP/PT/Radials 2+ and equal bilaterally.  Labs   Recent Labs  08/09/13 1240 08/09/13 1836 08/10/13 0100  TROPONINI <0.30 <0.30 <0.30   Lab Results  Component Value Date   WBC 9.8 08/11/2013   HGB 14.5 08/11/2013   HCT 43.1 08/11/2013   MCV 86.9 08/11/2013   PLT 158 08/11/2013    Recent Labs Lab 08/06/13 1430  08/11/13 0330  NA 141  < > 138  K 4.2  < > 3.3*  CL 103  < > 97  CO2 26  < > 29  BUN 16  < > 22  CREATININE 1.25  < > 1.63*  CALCIUM 9.6  < > 9.1  PROT 7.8  --   --   BILITOT 0.3  --   --   ALKPHOS 122*  --   --   ALT 16  --   --   AST 23  --   --   GLUCOSE 150*  < > 113*  < > = values in this interval not displayed. No results found for this basename: CHOL,  HDL,  LDLCALC,  TRIG   Lab Results  Component Value Date   DDIMER 3.02* 08/09/2013    Radiology/Studies  Ct Abdomen Pelvis W Contrast  08/06/2013   CLINICAL DATA:  Right flank pain, dysuria.  EXAM: CT ABDOMEN AND PELVIS WITH CONTRAST IMPRESSION: 1. Dilated small bowel with poor progression of oral contrast material, no definite transition point or etiology of  obstruction. 2. Progressive central mesenteric adenopathy with some mild mesenteric edema. 3. Left nephrolithiasis without hydronephrosis.   Electronically Signed   By: Oley Balm M.D.   On: 08/06/2013 17:49   US Aorta  08/06/2013   *RADIOLOGY REPORT*  Clinical Data:  Abdominal pain.  ULTRASOUND OF ABDOMINAL AORTA   IMPRESSION: No abdominal aortic aneurysm identified.Atherosclerotic irregularity.   Original Report Authenticated By: Francene Boyers, M.D.   Dg Chest Port 1 View  08/11/2013   CLINICAL DATA:  Edema  EXAM: PORTABLE CHEST - 1 VIEW   IMPRESSION: Continued improvement in pulmonary edema.   Electronically Signed   By: Tiburcio Pea  On: 08/11/2013 05:39   Dg Abd Acute W/chest  08/06/2013   CLINICAL DATA:  Upper abdominal pain, nausea, vomiting.  EXAM: ACUTE ABDOMEN SERIES (ABDOMEN 2 VIEW & CHEST 1 VIEW)   IMPRESSION: Mild diffuse gaseous distention of bowel, question mild ileus. No evidence of bowel obstruction or free air.  Diffuse interstitial prominence throughout the lungs, likely chronic interstitial lung disease.   Electronically Signed   By: Charlett Nose M.D.   On: 08/06/2013 16:10   Dg Abd Portable 2v  08/07/2013   *RADIOLOGY REPORT*  Clinical Data: NG tube placement  PORTABLE ABDOMEN - 2 VIEW  IMPRESSION: Interval distal passage of contrast and nondilated colon and decreased gaseous distention of small bowel, suggesting improvement in small bowel obstruction or ileus.   Original Report Authenticated By: Christiana Pellant, M.D.   ECG  St, 102, poor R prog, lateral ST flattening with twi in V5-6.  2D Echocardiogram   Study Conclusions  - Study data: Technically difficult study - Left ventricle: The cavity size was normal. Wall thickness   was normal. Systolic function was moderately reduced. The   estimated ejection fraction was in the range of 35% to   40%. - Regional wall motion abnormality: Akinesis of the apical   septal, apical lateral, and apical myocardium. -  Aortic valve: Moderately calcified annulus.Uncertain   number of leaflets,moderately thickened leaflets. Moderate   regurgitation. Valve area: 1.95cm^2(VTI). Valve area:   1.81cm^2 (Vmax). - Pericardium, extracardiac: Suggestion of small pericardial   effusion anterior to the right venticle in the subcostal   view. No evidence of RA or RV diastolic collapse. _____________   ASSESSMENT AND PLAN  1.  Acute pulmonary edema with new finding of cardiomyopathy:  Pt developed acute pulmonary edema on 9/20 after receiving a fair amt of IV fluid in the setting of SBO.  He has no prior h/o CAD or CHF.  Of note, his admission ECG does show poor R progression suggestive of prior anterior infarct.  His echo shows EF of 35-40% with apical wall motion abnormalities.  Despite this acute event, his troponin's have been normal.  This all suggests that he likely had an out of hospital MI at some point with resultant cardiomyopathy and subsequent development of acute pulmonary edema/CHF in the setting of IVF and volume overload.  He is now diuresed and asymptomatic.  Will add low-dose coreg.  No acei/arb at this time 2/2 CKD.  Switch lasix to PO (may not need at d/c).  Will arrange outpt MV and outpt f/u.  May need cath if high risk MV.  Signed, Nicolasa Ducking, NP 08/11/2013, 1:08 PM  History and all data above reviewed.  Patient examined.  I agree with the findings as above.   The patient has new onset CHF with a reduced EF and new wall motion abnormality on echo.  However, he has no prior symptoms and no on going symptoms.  The patient denies any new symptoms such as chest discomfort, neck or arm discomfort. There has been no new shortness of breath, PND or orthopnea. There have been no reported palpitations, presyncope or syncope.   He is very active .  He has ruled out.  The EKG shows biphasic T wave in the anterior lead.  This may indicate a recent event but he has absolutely no recent or acute symptoms prior to  his acute dyspnea this admission.  He now feels very well and wants to eat and go home.   The patient exam reveals  COR:RRR  ,  Lungs: Clear  ,  Abd: Positive bowel sounds, no rebound no guarding, Ext No edema  .  All available labs, radiology testing, previous records reviewed. Agree with documented assessment and plan. Cardiomyopathy probably ischemic.  I will add a low dose of beta blocker and plan outpatient follow up with Lexiscan Myoview.  Fayrene Fearing Shinika Estelle  1:47 PM  08/11/2013

## 2013-08-11 NOTE — Progress Notes (Signed)
Pt walked to bathroom and was able to have very small formed BM and also passed a large amount of gas. Pt states he feels much more comfortable.

## 2013-08-11 NOTE — Progress Notes (Signed)
Subjective: Feels much better, extubated yesterday, and started some clears, had first clears dinner last PM.  Small BM x 1.  Objective: Vital signs in last 24 hours: Temp:  [97.1 F (36.2 C)-98.8 F (37.1 C)] 98.5 F (36.9 C) (09/22 0350) Pulse Rate:  [58-80] 69 (09/21 2000) Resp:  [11-20] 16 (09/22 0500) BP: (99-150)/(49-84) 106/49 mmHg (09/22 0317) SpO2:  [93 %-96 %] 95 % (09/22 0500) FiO2 (%):  [40 %] 40 % (09/21 0912) Last BM Date: 08/06/13 860 PO Afebrile, 99/56, 103/49 - last 2 pressures, HR in the 60's, SAts 93-95% K+3.3 Diet Clear liquids Intake/Output from previous day: 09/21 0701 - 09/22 0700 In: 1305 [P.O.:860; I.V.:425] Out: 3520 [Urine:3400; Emesis/NG output:120] Intake/Output this shift:    General appearance: alert, cooperative and no distress Resp: clear to auscultation bilaterally GI: soft, non-tender; bowel sounds normal; no masses,  no organomegaly and BS still a little hyper active.  Lab Results:   Recent Labs  08/10/13 0100 08/11/13 0330  WBC 14.3* 9.8  HGB 15.2 14.5  HCT 46.5 43.1  PLT 180 158    BMET  Recent Labs  08/10/13 0100 08/11/13 0330  NA 139 138  K 4.0 3.3*  CL 103 97  CO2 26 29  GLUCOSE 119* 113*  BUN 17 22  CREATININE 1.53* 1.63*  CALCIUM 9.0 9.1   PT/INR No results found for this basename: LABPROT, INR,  in the last 72 hours   Recent Labs Lab 08/06/13 1430  AST 23  ALT 16  ALKPHOS 122*  BILITOT 0.3  PROT 7.8  ALBUMIN 3.9     Lipase     Component Value Date/Time   LIPASE 40 08/06/2013 1448     Studies/Results: Dg Chest Port 1 View  08/11/2013   CLINICAL DATA:  Edema  EXAM: PORTABLE CHEST - 1 VIEW  COMPARISON:  08/10/2013  FINDINGS: Interval tracheal and esophageal extubation. Decreasing interstitial opacities, with residual coarse interstitium possibly the patient's background. Hyperinflated lungs. Normal heart size and mediastinal contours.  IMPRESSION: Continued improvement in pulmonary edema.    Electronically Signed   By: Tiburcio Pea   On: 08/11/2013 05:39   Dg Chest Port 1 View  08/10/2013   CLINICAL DATA:  Check ETT  EXAM: PORTABLE CHEST - 1 VIEW  COMPARISON:  08/09/2013  FINDINGS: Endotracheal tube terminates 3.5 cm above the carina.  Mild patchy perihilar/right lower lobe opacities, reflecting mild interstitial edema, improved. Possible trace left pleural effusion. No pneumothorax.  The heart is normal in size.  Enteric tube courses below the diaphragm.  IMPRESSION: Endotracheal tube terminates 3.5 cm above the carina.  Suspected improving mild interstitial edema.   Electronically Signed   By: Charline Bills M.D.   On: 08/10/2013 07:36   Dg Chest Port 1 View  08/09/2013   CLINICAL DATA:  Hypoxia with endotracheal tube repositioning  EXAM: PORTABLE CHEST - 1 VIEW  COMPARISON:  Study obtained earlier in the day  FINDINGS: The endotracheal tube tip is 3.7 cm above the carina. No pneumothorax. There remains moderate interstitial and alveolar edema, less than earlier in the day. No new opacity. The heart size and pulmonary vascularity are normal. No apparent adenopathy.  IMPRESSION: Endotracheal tube tip 3.7 cm above the carina. No pneumothorax. Moderate edema, less than earlier in the day. No new opacity.   Electronically Signed   By: Bretta Bang   On: 08/09/2013 17:10   Dg Chest Port 1 View  08/09/2013   CLINICAL DATA:  Respiratory distress.  EXAM: PORTABLE CHEST - 1 VIEW  COMPARISON:  Of 08/09/2013  FINDINGS: Endotracheal tube tip 6 cm above the carina. Bilateral interstitial and airspace opacities minimally worsened compared to the earlier exam. Underlying emphysema is present. No cardiomegaly.  IMPRESSION: 1. Mildly worsened perihilar airspace opacity and diffuse interstitial opacity. Appearance may reflect ARDS, atypical pneumonia, or noncardiogenic edema superimposed on underlying emphysema.   Electronically Signed   By: Herbie Baltimore   On: 08/09/2013 12:35   Dg Chest Port  1 View  08/09/2013   CLINICAL DATA:  Shortness of breath.  EXAM: PORTABLE CHEST - 1 VIEW  COMPARISON:  08/06/2013  FINDINGS: Nasogastric tube extends down into the stomach.  Bilateral interstitial and right greater than left perihilar airspace opacities are present, significantly increased compared to 08/06/2013. Heart size remains within normal limits.  Emphysema noted.  IMPRESSION: 1. Diffuse interstitial opacity with perihilar airspace opacities, right greater than left, with top differential diagnostic considerations including atypical pneumonia and acute noncardiogenic pulmonary edema. 2. Emphysema.   Electronically Signed   By: Herbie Baltimore   On: 08/09/2013 11:48    Medications: . acidophilus  1 capsule Oral Daily  . bisacodyl  10 mg Rectal Daily  . heparin  5,000 Units Subcutaneous Q8H  . levalbuterol  0.63 mg Nebulization Once  . lip balm  1 application Topical BID  . pantoprazole (PROTONIX) IV  40 mg Intravenous QHS    Assessment/Plan SBO; s/p cholecystectomy, SB repair, Hiatal hernia repair and inguinal hernia repair  Hospitalized 10/2012 with ARF/Dehydration/sepsis/acute diverticulitis/enteritis  Colon resection Jan 2014 in [facility], for Diverticulosis episode in 10/2012. Acute respiratory distress with intubation 08/09/13 2D Echo 08/10/13  EF 35-40% CRI new this admit, he has ARF/dehydration in DEC 2013.   Plan:  Will let CCM see and discuss, This may all be due to low CHF, no prior history of cardiac issues.  Will need medicine to see and work this up CM.   Continue to monitor renal function. K+  Give him a small amount.  LOS: 5 days    Aydrian Halpin 08/11/2013

## 2013-08-11 NOTE — Progress Notes (Signed)
[name] [name] is complaining of much discomfort related to his NGT. It is currently clamped, with only 20ml aspirated for residual. His abdomen is soft, not distended, and has active bowel sounds with considerable flatus. Dr. Deloris Ping consulted. NGT removed.

## 2013-08-11 NOTE — Progress Notes (Signed)
[facility] Nursing ICU Electrolyte Replacement Protocol  Patient Name: [name] [name] DOB: [birth year] MRN: [id number]  Date of Service  08/11/2013   HPI/Events of Note    Recent Labs Lab 08/07/13 0445 08/08/13 0400 08/09/13 0621 08/10/13 0100 08/11/13 0330  NA 139 142 143 139 138  K 3.8 3.6 4.2 4.0 3.3*  CL 105 106 111 103 97  CO2 25 25 24 26 29   GLUCOSE 102* 70 98 119* 113*  BUN 16 18 17 17 22   CREATININE 1.25 1.31 1.22 1.53* 1.63*  CALCIUM 8.2* 8.7 8.4 9.0 9.1    Estimated Creatinine Clearance: 34.6 ml/min (by C-G formula based on Cr of 1.63).  Intake/Output     09/21 0701 - 09/22 0700   P.O. 860   I.V. (mL/kg) 425 (6.3)   Other 20   Total Intake(mL/kg) 1305 (19.3)   Urine (mL/kg/hr) 3400 (2.1)   Emesis/NG output 120 (0.1)   Total Output 3520   Net -2215       Urine Occurrence 1 x   Stool Occurrence 1 x    - I/O DETAILED x24h    Total I/O In: 290 [P.O.:120; I.V.:170] Out: 1020 [Urine:1000; Emesis/NG output:20] - I/O THIS SHIFT    ASSESSMENT   eICURN Interventions  K+ 3.3  Electrolyte protocol criteria met not due to GFR 38.  NOT Replace per protocol.  MD notified   ASSESSMENT: MAJOR ELECTROLYTE    Merita Norton 08/11/2013, 6:07 AM

## 2013-08-12 DIAGNOSIS — I5021 Acute systolic (congestive) heart failure: Secondary | ICD-10-CM

## 2013-08-12 LAB — BASIC METABOLIC PANEL
BUN: 22 mg/dL (ref 6–23)
CO2: 30 mEq/L (ref 19–32)
Calcium: 8.7 mg/dL (ref 8.4–10.5)
Chloride: 98 mEq/L (ref 96–112)
Creatinine, Ser: 1.55 mg/dL — ABNORMAL HIGH (ref 0.50–1.35)
GFR calc Af Amer: 47 mL/min — ABNORMAL LOW (ref >90)
GFR calc non Af Amer: 41 mL/min — ABNORMAL LOW (ref >90)
Glucose, Bld: 103 mg/dL — ABNORMAL HIGH (ref 70–99)
Potassium: 3.4 mEq/L — ABNORMAL LOW (ref 3.5–5.1)
Sodium: 137 mEq/L (ref 135–145)

## 2013-08-12 LAB — GLUCOSE, CAPILLARY
Glucose-Capillary: 103 mg/dL — ABNORMAL HIGH (ref 70–99)
Glucose-Capillary: 93 mg/dL (ref 70–99)
Glucose-Capillary: 98 mg/dL (ref 70–99)

## 2013-08-12 LAB — CULTURE, RESPIRATORY W GRAM STAIN

## 2013-08-12 LAB — CULTURE, RESPIRATORY: Special Requests: NORMAL

## 2013-08-12 MED ORDER — CARVEDILOL 3.125 MG PO TABS: 3.1250 mg | ORAL_TABLET | Freq: Two times a day (BID) | ORAL | Status: DC

## 2013-08-12 MED ORDER — FUROSEMIDE 20 MG PO TABS: 20.0000 mg | ORAL_TABLET | Freq: Every day | ORAL | Status: DC

## 2013-08-12 NOTE — Progress Notes (Signed)
    Subjective:  C/o generalized weakness. Otherwise feels ok. Denies chest pain or dyspnea.   Objective:  Vital Signs in the last 24 hours: Temp:  [97.8 F (36.6 C)-98.3 F (36.8 C)] 97.8 F (36.6 C) (09/23 0558) Pulse Rate:  [56-71] 66 (09/23 0818) Resp:  [16-20] 16 (09/23 0558) BP: (106-125)/(58-73) 109/59 mmHg (09/23 0818) SpO2:  [90 %-98 %] 90 % (09/23 0558)  Intake/Output from previous day: 09/22 0701 - 09/23 0700 In: 930 [P.O.:900; I.V.:30] Out: 300 [Urine:300]  Physical Exam: Pt is alert and oriented, NAD HEENT: normal Neck: JVP - normal Lungs: CTA bilaterally CV: RRR without murmur or gallop Abd: soft, NT, Positive BS, no hepatomegaly Ext: no C/C/E, distal pulses intact and equal Skin: warm/dry no rash   Lab Results:  Recent Labs  08/10/13 0100 08/11/13 0330  WBC 14.3* 9.8  HGB 15.2 14.5  PLT 180 158    Recent Labs  08/11/13 0330 08/12/13 0435  NA 138 137  K 3.3* 3.4*  CL 97 98  CO2 29 30  GLUCOSE 113* 103*  BUN 22 22  CREATININE 1.63* 1.55*    Recent Labs  08/09/13 1836 08/10/13 0100  TROPONINI <0.30 <0.30    Cardiac Studies: 2D Echo 08/10/2013: Study Conclusions  - Study data: Technically difficult study - Left ventricle: The cavity size was normal. Wall thickness was normal. Systolic function was moderately reduced. The estimated ejection fraction was in the range of 35% to 40%. - Regional wall motion abnormality: Akinesis of the apical septal, apical lateral, and apical myocardium. - Aortic valve: Moderately calcified annulus.Uncertain number of leaflets,moderately thickened leaflets. Moderate regurgitation. Valve area: 1.95cm^2(VTI). Valve area: 1.81cm^2 (Vmax). - Pericardium, extracardiac: Suggestion of small pericardial effusion anterior to the right venticle in the subcostal view. No evidence of RA or RV diastolic collapse.  Tele: Sinus rhythm, personally reviewed  Assessment/Plan:  1. Acute pulmonary edema -  resolved 2. Cardiomyopathy, suspect ischemic (segmental LV wall motion abnormalities) 3. Small bowel obstruction, improved with conservative Rx  Plans and med Rx as per note of Dr Antoine Poche from 9/22. He is tolerating coreg and lasix and I would continue these. Consider add ACE as outpatient, but hold for now considering his reduced creatinine clearance and risk of acute kidney injury. Plan outpatient Myoview and then follow-up with Dr Antoine Poche (this will be arranged). Please call if questions. thx   Tonny Bollman, M.D. 08/12/2013, 8:21 AM

## 2013-08-12 NOTE — Progress Notes (Signed)
Subjective: Pt feels great.  No abdominal pain, No N/V.  Having BM's and flatus.  No SOB/chest pain, not on O2.  Wants to go home.  Tolerating clears, would like to eat something more substantial.    Objective: Vital signs in last 24 hours: Temp:  [97.8 F (36.6 C)-98.3 F (36.8 C)] 97.8 F (36.6 C) (09/23 0558) Pulse Rate:  [56-71] 66 (09/23 0818) Resp:  [16-20] 16 (09/23 0558) BP: (106-125)/(58-73) 109/59 mmHg (09/23 0818) SpO2:  [90 %-98 %] 90 % (09/23 0558) Last BM Date: 08/11/13  Intake/Output from previous day: 09/22 0701 - 09/23 0700 In: 930 [P.O.:900; I.V.:30] Out: 300 [Urine:300] Intake/Output this shift: Total I/O In: -  Out: 150 [Urine:150]  PE: Gen:  Alert, NAD, pleasant Card:  RRR, no M/G/R heard Pulm:  CTA, no W/R/R, great effort Abd: Soft, NT/ND, +BS, no HSM, abdominal scars noted   Lab Results:   Recent Labs  08/10/13 0100 08/11/13 0330  WBC 14.3* 9.8  HGB 15.2 14.5  HCT 46.5 43.1  PLT 180 158   BMET  Recent Labs  08/11/13 0330 08/12/13 0435  NA 138 137  K 3.3* 3.4*  CL 97 98  CO2 29 30  GLUCOSE 113* 103*  BUN 22 22  CREATININE 1.63* 1.55*  CALCIUM 9.1 8.7   PT/INR No results found for this basename: LABPROT, INR,  in the last 72 hours CMP     Component Value Date/Time   NA 137 08/12/2013 0435   K 3.4* 08/12/2013 0435   CL 98 08/12/2013 0435   CO2 30 08/12/2013 0435   GLUCOSE 103* 08/12/2013 0435   BUN 22 08/12/2013 0435   CREATININE 1.55* 08/12/2013 0435   CALCIUM 8.7 08/12/2013 0435   PROT 7.8 08/06/2013 1430   ALBUMIN 3.9 08/06/2013 1430   AST 23 08/06/2013 1430   ALT 16 08/06/2013 1430   ALKPHOS 122* 08/06/2013 1430   BILITOT 0.3 08/06/2013 1430   GFRNONAA 41* 08/12/2013 0435   GFRAA 47* 08/12/2013 0435   Lipase     Component Value Date/Time   LIPASE 40 08/06/2013 1448       Studies/Results: Dg Chest Port 1 View  08/11/2013   CLINICAL DATA:  Edema  EXAM: PORTABLE CHEST - 1 VIEW  COMPARISON:  08/10/2013  FINDINGS:  Interval tracheal and esophageal extubation. Decreasing interstitial opacities, with residual coarse interstitium possibly the patient's background. Hyperinflated lungs. Normal heart size and mediastinal contours.  IMPRESSION: Continued improvement in pulmonary edema.   Electronically Signed   By: Tiburcio Pea   On: 08/11/2013 05:39    Anti-infectives: Anti-infectives   None       Assessment/Plan SBO; s/p cholecystectomy, SB repair, Hiatal hernia repair and inguinal hernia repair  Hospitalized 10/2012 with ARF/Dehydration/sepsis/acute diverticulitis/enteritis  Colon resection Jan 2014 in [facility], for Diverticulosis episode in 10/2012.  Acute respiratory distress with intubation 08/09/13  2D Echo 08/10/13 EF 35-40%  CRI new this admit, he has ARF/dehydration in DEC 2013.    Plan:  1.  Pulmonary has signed off, respiratory failure resolved 2.  Kidney function continues to improve 3.  Heart failure - symptoms are improved since diuresed, cards recommended OP evaluation and to continue Coreg and lasix.  Hold ACE due to ARF.  F/u with cardiology as an OP 4.  Made appt with PCP Dr. Juliene Pina PA-C [place] 5.  Discharge today after tolerating regular food.    LOS: 6 days    [name] [name] 08/12/2013, 10:32 AM Pager: [phone]

## 2013-08-12 NOTE — Discharge Summary (Signed)
Physician Discharge Summary  [name] MRN: [id number] DOB/AGE: [birth year] 79 y.o.  Admit date: 08/06/2013 Discharge date: 08/12/2013  Admitting Diagnosis: SBO Nausea/vomiting Abdominal pain  Discharge Diagnosis Patient Active Problem List   Diagnosis Date Noted  . Acute systolic heart failure 08/11/2013  . Chronic kidney disease (CKD), stage III (moderate) 08/11/2013  . Acute respiratory failure with hypoxia 08/09/2013  . Acute pulmonary edema 08/09/2013  . Small bowel obstruction 08/09/2013    Consultants Dr. Delford Field (Critical care/Pulmonology) Dr. Antoine Poche (Cardiology)  Imaging: Dg Chest Port 1 View  08/11/2013   CLINICAL DATA:  Edema  EXAM: PORTABLE CHEST - 1 VIEW  COMPARISON:  08/10/2013  FINDINGS: Interval tracheal and esophageal extubation. Decreasing interstitial opacities, with residual coarse interstitium possibly the patient's background. Hyperinflated lungs. Normal heart size and mediastinal contours.  IMPRESSION: Continued improvement in pulmonary edema.   Electronically Signed   By: Tiburcio Pea   On: 08/11/2013 05:39    Procedures None  Hospital Course:  79 year old male who presented to the ER at night with diffuse abdominal pain. He states that this started approximately 2:00 this on the day of admission with an episode of nausea and vomiting. He has noticed some bloating after meals for approximately 2 weeks. He has a history of a hospitalization 10/2012 for ARF, dehydration, sepsis, acute diverticulitis and enteritis.  He required a colon resection in 1//2014 for diverticulitis.  He has had a laparoscopic cholecystectomy and open hiatal hernia repair in the distant past. He has also underwent bilateral inguinal hernia repairs.   Workup showed a SBO on abdominal CT.  He was treated conservatively with medical management including bowel rest and NG tube.  On 08/08/13 the NG tube was clamped and the patient tolerated it well.  Early on 9/20 the  patient developed acute onset of SOB with CXR concerning for edema.  He progressed to be obtuned with oxygen saturations in the 50's requiring intubation per anesthesia.  Critical care saw the patient and transferred to the ICU.  He required diuresis which soon improved his pulmonary edema.  He also had mild elevation in SCr levels.  He was weaned from the ventilator and extubated on 08/10/13.  An echo was obtained and showed EF 35 to 40%, mod AR.  Bilateral LE dopplers showed no DVT.  He continued to improve after extubation and was moved to the floor on 08/11/13.  He was started on clear liquids.  He was then advanced on HD #7 to soft diet which he tolerated well.  His SCr has gradually improved and is trending down. On HD #7, the patient was voiding well, tolerating diet, ambulating well, pain well controlled, vital signs stable, and felt stable for discharge home.  Patient will follow up in our office as needed and knows to call with questions or concerns.  He will follow up with his PCP on this upcoming Tuesday and have his SCr rechecked.  He will also f/u with Cardiology for additional workup.  He will continue on Coreg at discharge.     Medication List         CALCIUM-MAGNESIUM-ZINC PO  Take 1 tablet by mouth daily.     carboxymethylcellulose 0.5 % Soln  Commonly known as:  REFRESH PLUS  Place 1 drop into both ears 3 (three) times daily as needed.     carvedilol 3.125 MG tablet  Commonly known as:  COREG  Take 1 tablet (3.125 mg total) by mouth 2 (two) times daily with a  meal.     CINNAMON PO  Take 1 tablet by mouth daily.     CO Q-10 PO  Take 1 capsule by mouth daily.     fish oil-omega-3 fatty acids 1000 MG capsule  Take 1 g by mouth daily.     hydrocortisone cream 1 %  Apply 1 application topically 2 (two) times daily as needed. Itching     NIACIN PO  Take 1 tablet by mouth daily.     omeprazole 20 MG capsule  Commonly known as:  PRILOSEC  Take 20 mg by mouth daily.      POTASSIUM PO  Take 1 tablet by mouth daily.     VITAMIN A PO  Take 1 tablet by mouth daily.     VITAMIN B-1 PO  Take 1 tablet by mouth daily.     VITAMIN B-12 PO  Take 1 tablet by mouth daily.     VITAMIN B-6 PO  Take 1 tablet by mouth daily.     VITAMIN C PO  Take 1,000 mg by mouth daily.     VITAMIN D PO  Take 1 tablet by mouth daily.     VITAMIN E PO  Take 1 tablet by mouth daily.             Follow-up Information   Follow up with DOYLE, PATRICIA K, PA-C On 08/19/2013. (Your appointment is on Tuesday September 30th at 10:00am, please arrive prior to your appointment for check in)    Specialty:  Physician Assistant   Contact information:   [address] [place] [address] [phone]       Follow up with CCS,MD, MD. (Follow up with Dr. Romie Levee as needed, but no specific follow up needed unless your symptoms worsen)    Specialty:  General Surgery   Contact information:   [address] [place] [address] [phone]       Call Rollene Rotunda, MD. (The office should call you with your appointment time)    Specialty:  Cardiology   Contact information:   [address] [place] [address] [phone]      This discharge summary took 65 minutes to write as numerous notes, labs, studies were reviewed to compile the patients hospital course.  In addition, I had to consulting with physicians for discharge arrangements, follow up and medication management.   Signed: Aris Georgia, [facility] [phone]  08/12/2013, 11:24 AM

## 2013-08-16 LAB — CULTURE, BLOOD (ROUTINE X 2)
Culture: NO GROWTH
Culture: NO GROWTH

## 2013-08-19 ENCOUNTER — Other Ambulatory Visit: Payer: Self-pay | Admitting: *Deleted

## 2013-08-19 ENCOUNTER — Other Ambulatory Visit: Payer: Self-pay

## 2013-08-19 ENCOUNTER — Encounter: Payer: Self-pay | Admitting: Cardiology

## 2013-08-19 DIAGNOSIS — I429 Cardiomyopathy, unspecified: Secondary | ICD-10-CM

## 2013-08-19 DIAGNOSIS — R0600 Dyspnea, unspecified: Secondary | ICD-10-CM

## 2013-09-12 ENCOUNTER — Encounter: Payer: Medicare Other | Admitting: Physician Assistant

## 2013-09-12 ENCOUNTER — Ambulatory Visit: Payer: Medicare Other | Admitting: Cardiology

## 2013-10-02 ENCOUNTER — Encounter: Payer: Self-pay | Admitting: *Deleted

## 2013-10-02 ENCOUNTER — Telehealth: Payer: Self-pay | Admitting: *Deleted

## 2013-10-02 ENCOUNTER — Ambulatory Visit (INDEPENDENT_AMBULATORY_CARE_PROVIDER_SITE_OTHER): Payer: Medicare Other | Admitting: Cardiology

## 2013-10-02 ENCOUNTER — Ambulatory Visit (INDEPENDENT_AMBULATORY_CARE_PROVIDER_SITE_OTHER)
Admission: RE | Admit: 2013-10-02 | Discharge: 2013-10-02 | Disposition: A | Discharge status: Home / Self Care | Payer: Medicare Other | Source: Ambulatory Visit | Attending: Cardiology | Admitting: Cardiology

## 2013-10-02 VITALS — BP 124/76 | HR 72 | Ht 69.0 in | Wt 154.0 lb

## 2013-10-02 DIAGNOSIS — I5022 Chronic systolic (congestive) heart failure: Secondary | ICD-10-CM

## 2013-10-02 DIAGNOSIS — I509 Heart failure, unspecified: Secondary | ICD-10-CM

## 2013-10-02 DIAGNOSIS — J811 Chronic pulmonary edema: Secondary | ICD-10-CM

## 2013-10-02 DIAGNOSIS — I2510 Atherosclerotic heart disease of native coronary artery without angina pectoris: Secondary | ICD-10-CM

## 2013-10-02 DIAGNOSIS — N183 Chronic kidney disease, stage 3 unspecified: Secondary | ICD-10-CM

## 2013-10-02 DIAGNOSIS — E876 Hypokalemia: Secondary | ICD-10-CM

## 2013-10-02 LAB — BASIC METABOLIC PANEL
BUN: 12 mg/dL (ref 6–23)
CO2: 29 mEq/L (ref 19–32)
Calcium: 8.1 mg/dL — ABNORMAL LOW (ref 8.4–10.5)
Chloride: 104 mEq/L (ref 96–112)
Creatinine, Ser: 1.3 mg/dL (ref 0.4–1.5)
GFR: 57.34 mL/min — ABNORMAL LOW (ref >60.00)
Glucose, Bld: 97 mg/dL (ref 70–99)
Potassium: 2.8 mEq/L — CL (ref 3.5–5.1)
Sodium: 138 mEq/L (ref 135–145)

## 2013-10-02 LAB — CBC WITH DIFFERENTIAL/PLATELET
Basophils Absolute: 0 10*3/uL (ref 0.0–0.1)
Basophils Relative: 0.2 % (ref 0.0–3.0)
Eosinophils Absolute: 0.3 10*3/uL (ref 0.0–0.7)
Eosinophils Relative: 3.1 % (ref 0.0–5.0)
HCT: 37.6 % — ABNORMAL LOW (ref 39.0–52.0)
Hemoglobin: 12.5 g/dL — ABNORMAL LOW (ref 13.0–17.0)
Lymphocytes Relative: 18.6 % (ref 12.0–46.0)
Lymphs Abs: 1.8 10*3/uL (ref 0.7–4.0)
MCHC: 33.3 g/dL (ref 30.0–36.0)
MCV: 86.2 fl (ref 78.0–100.0)
Monocytes Absolute: 1.3 10*3/uL — ABNORMAL HIGH (ref 0.1–1.0)
Monocytes Relative: 13.2 % — ABNORMAL HIGH (ref 3.0–12.0)
Neutro Abs: 6.1 10*3/uL (ref 1.4–7.7)
Neutrophils Relative %: 64.9 % (ref 43.0–77.0)
Platelets: 214 10*3/uL (ref 150.0–400.0)
RBC: 4.36 Mil/uL (ref 4.22–5.81)
RDW: 15.3 % — ABNORMAL HIGH (ref 11.5–14.6)
WBC: 9.5 10*3/uL (ref 4.5–10.5)

## 2013-10-02 LAB — BRAIN NATRIURETIC PEPTIDE: Pro B Natriuretic peptide (BNP): 856 pg/mL — ABNORMAL HIGH (ref 0.0–100.0)

## 2013-10-02 LAB — PROTIME-INR
INR: 1.3 ratio — ABNORMAL HIGH (ref 0.8–1.0)
Prothrombin Time: 13.3 s — ABNORMAL HIGH (ref 10.2–12.4)

## 2013-10-02 IMAGING — CR DG CHEST 2V
2 series · 2 of 2 positions shown · non-contrast
Comparison: [DATE]

CLINICAL DATA: Ex-smoker with pulmonary edema.

EXAM:
CHEST  2 VIEW

[view not recorded (1 of 2)]
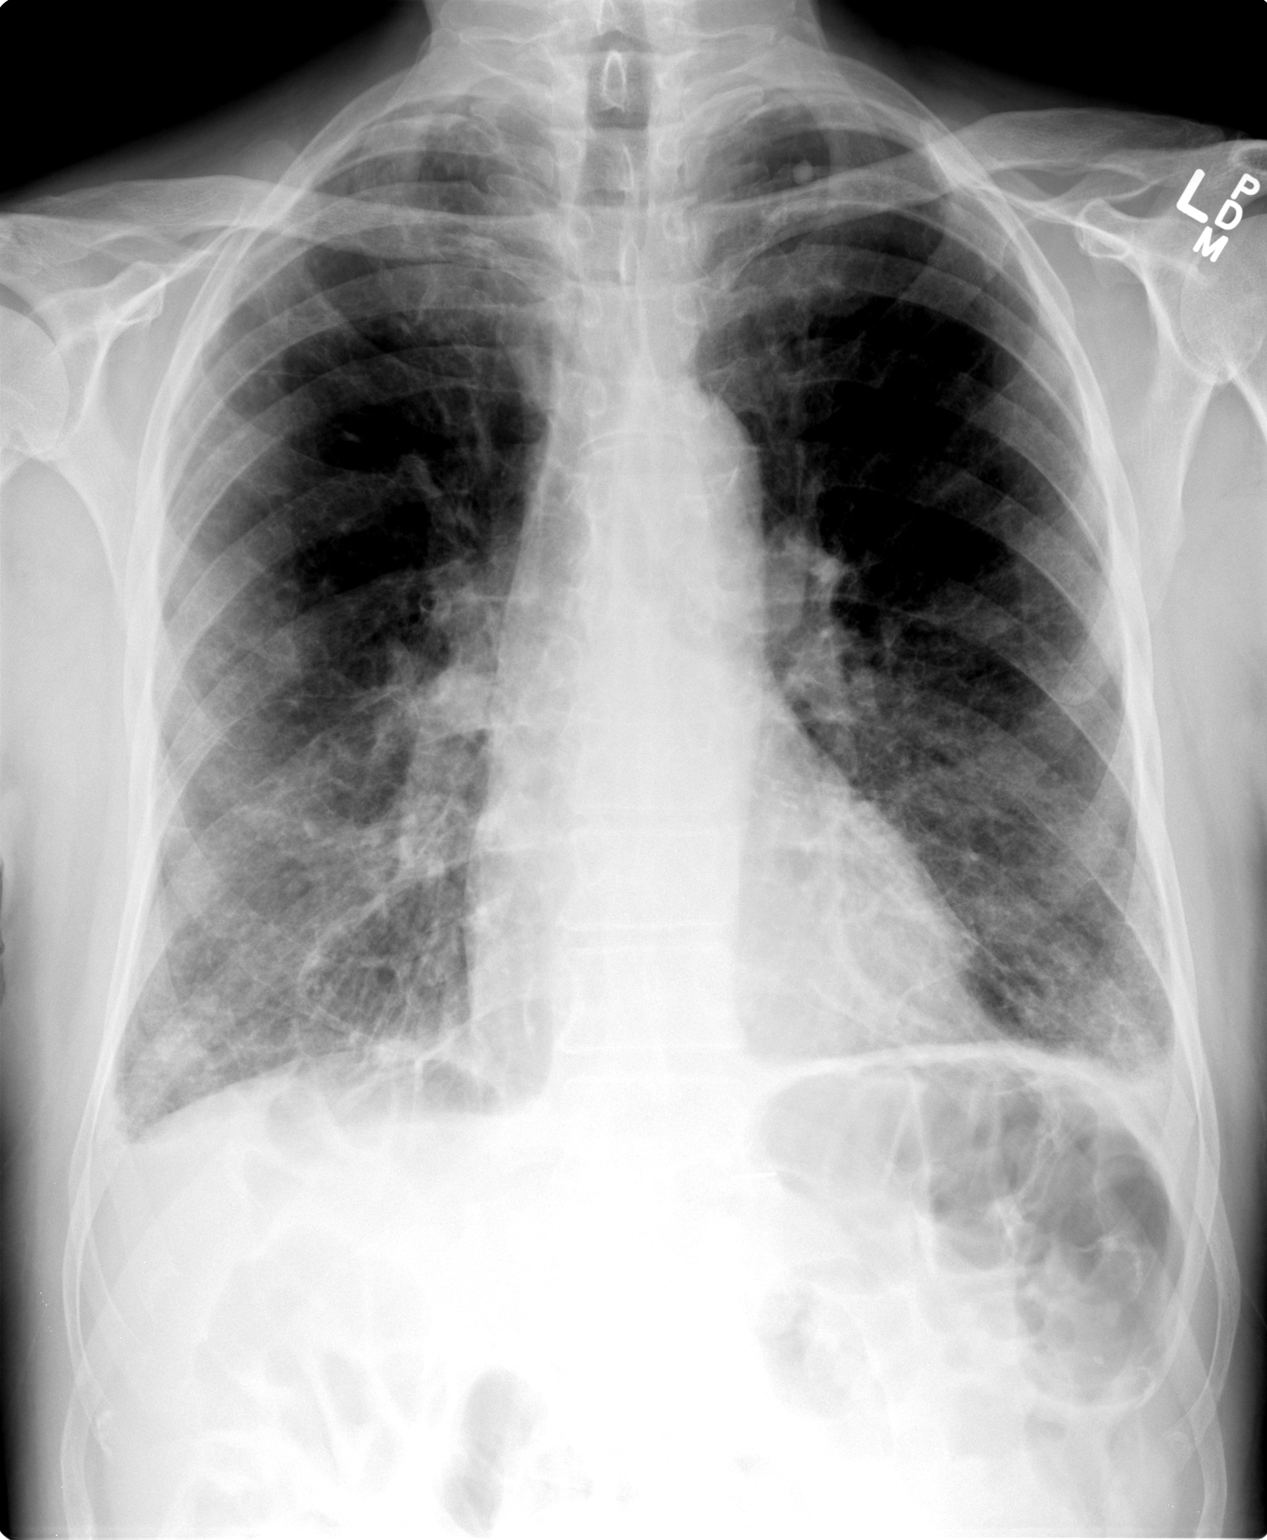

[view not recorded (2 of 2)]
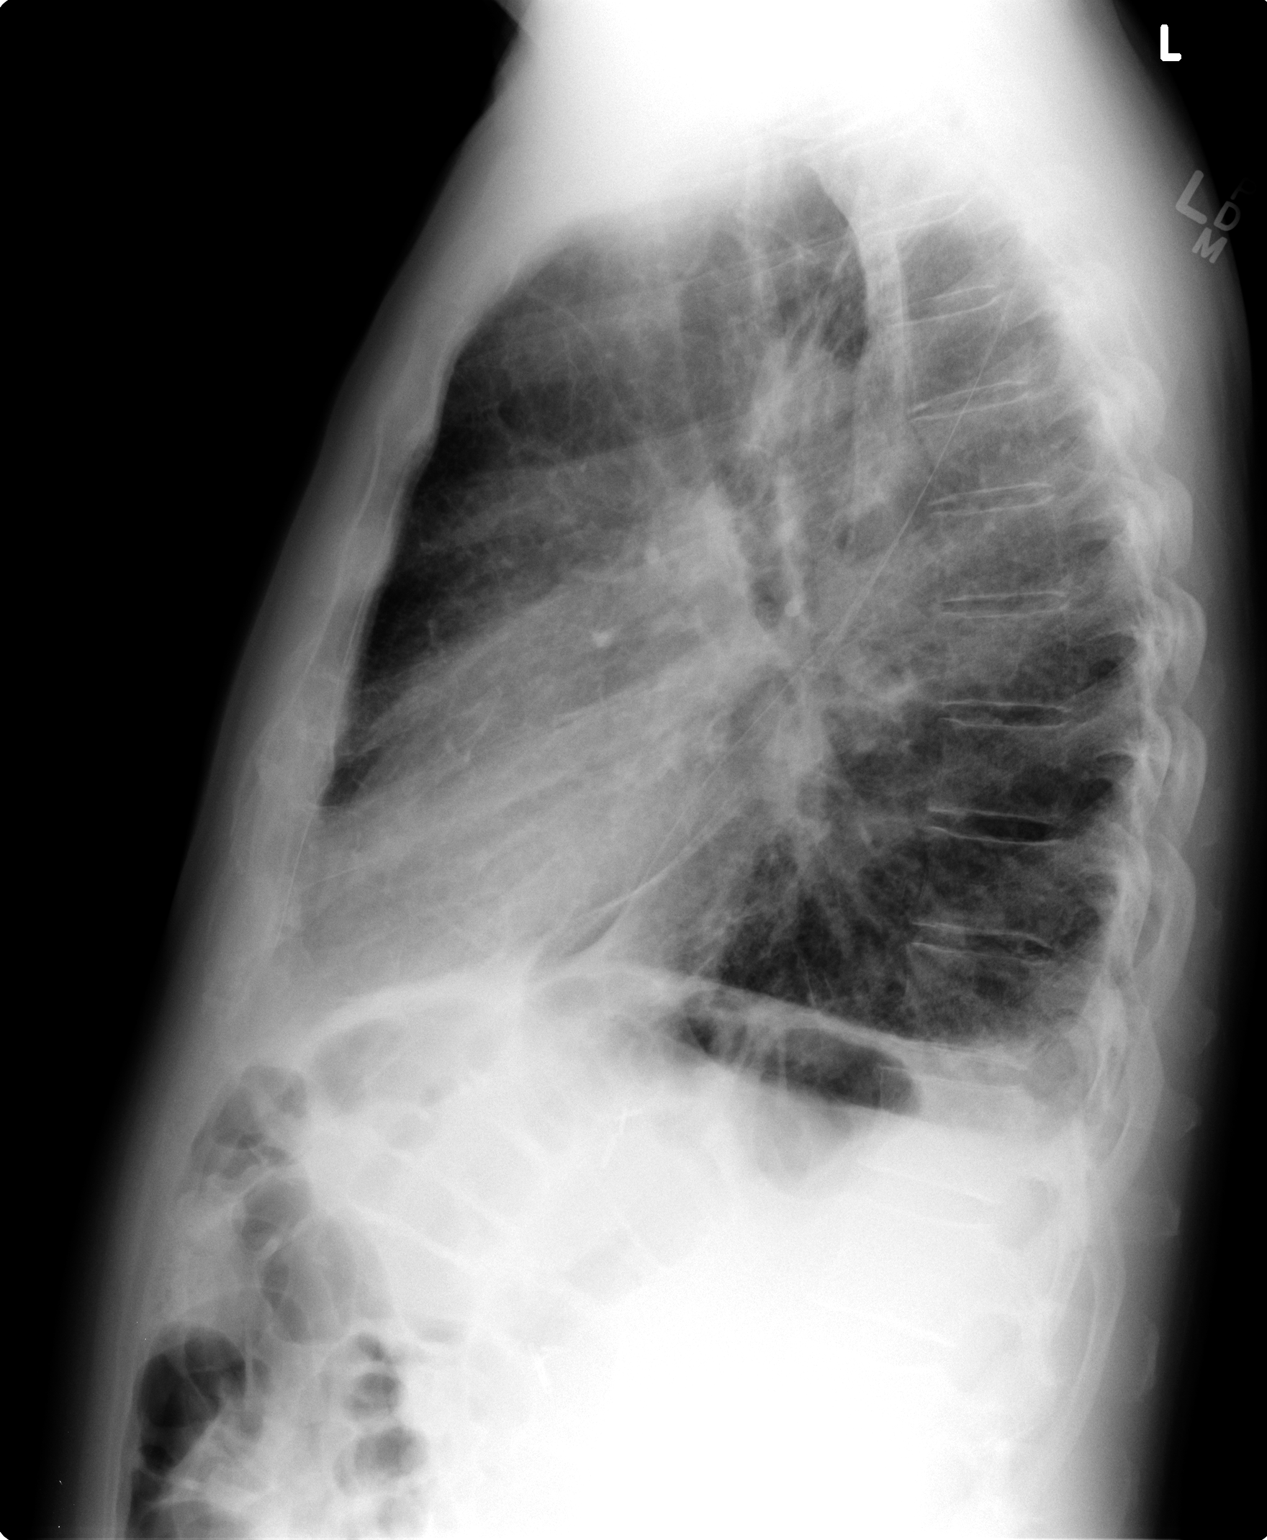

[2 of 2 positions shown; findings below may reference images not displayed]

FINDINGS: Midline trachea. Normal heart size with atherosclerosis in the
transverse aorta. Trace bilateral pleural effusions. No
pneumothorax. Left apical calcified granuloma. No lobar
consolidation. Patchy lower lobe predominant interstitial thickening
which is slightly increased. Mild bibasilar volume loss.

Mildly prominent gas-filled loops of bowel in the upper abdomen.
IMPRESSION: Slight increase in lower lobe predominant interstitial thickening,
suspicious for mild interstitial edema.

Small bilateral pleural effusions.

Prominent gas-filled bowel loops in the upper abdomen. Correlate
with abdominal complaints.

## 2013-10-02 MED ORDER — FUROSEMIDE 20 MG PO TABS: 20.0000 mg | ORAL_TABLET | Freq: Every day | ORAL | Status: DC

## 2013-10-02 MED ORDER — POTASSIUM CHLORIDE ER 10 MEQ PO TBCR: 10.0000 meq | EXTENDED_RELEASE_TABLET | Freq: Every day | ORAL | Status: DC

## 2013-10-02 MED ORDER — CARVEDILOL 3.125 MG PO TABS: 3.1250 mg | ORAL_TABLET | Freq: Two times a day (BID) | ORAL | Status: DC

## 2013-10-02 MED ORDER — ASPIRIN EC 81 MG PO TBEC
81.0000 mg | DELAYED_RELEASE_TABLET | Freq: Every day | ORAL | Status: AC
Start: 2013-10-02 — End: ?

## 2013-10-02 MED ORDER — LISINOPRIL 2.5 MG PO TABS
2.5000 mg | ORAL_TABLET | Freq: Every day | ORAL | Status: DC
Start: 2013-10-02 — End: 2013-10-10

## 2013-10-02 NOTE — Telephone Encounter (Signed)
Pt advised,verbalized understanding. 

## 2013-10-02 NOTE — Telephone Encounter (Signed)
Lab called K 2.8 today  Dr Shirlee Latch ordered pt take:  60 mEq K now Then 40 mEq daily BMET 1 week  I have attempted to contact pt by telephone several times with no answer. I will forward to Triage for follow-.

## 2013-10-02 NOTE — Patient Instructions (Addendum)
Take aspirin 81mg  daily.   Start coreg(carvedilol) 3.125mg  two times a day.   Start lisinopril 2.5mg  daily.  Start lasix (furosemide) 20mg  daily.  Start KCL(potassium) 10 mEq daily.  Your physician recommends that you have lab work today--BMET/BNP/CBCd/PT/INR.   A chest x-ray takes a picture of the organs and structures inside the chest, including the heart, lungs, and blood vessels. This test can show several things, including, whether the heart is enlarges; whether fluid is building up in the lungs; and whether pacemaker / defibrillator leads are still in place. TODAY  Your physician has requested that you have a cardiac catheterization. Cardiac catheterization is used to diagnose and/or treat various heart conditions. Doctors may recommend this procedure for a number of different reasons. The most common reason is to evaluate chest pain. Chest pain can be a symptom of coronary artery disease (CAD), and cardiac catheterization can show whether plaque is narrowing or blocking your heart's arteries. This procedure is also used to evaluate the valves, as well as measure the blood flow and oxygen levels in different parts of your heart. For further information please visit [email or web address] Please follow instruction sheet, as given. Thursday November 20,2014   Your physician recommends that you schedule a follow-up appointment in: about 3 weeks with Dr Shirlee Latch.

## 2013-10-03 ENCOUNTER — Other Ambulatory Visit: Payer: Self-pay | Admitting: *Deleted

## 2013-10-03 ENCOUNTER — Encounter (HOSPITAL_COMMUNITY): Payer: Self-pay | Admitting: Pharmacy Technician

## 2013-10-03 DIAGNOSIS — I2510 Atherosclerotic heart disease of native coronary artery without angina pectoris: Secondary | ICD-10-CM | POA: Insufficient documentation

## 2013-10-03 DIAGNOSIS — I5022 Chronic systolic (congestive) heart failure: Secondary | ICD-10-CM

## 2013-10-03 DIAGNOSIS — J811 Chronic pulmonary edema: Secondary | ICD-10-CM

## 2013-10-03 MED ORDER — FUROSEMIDE 20 MG PO TABS: 20.0000 mg | ORAL_TABLET | Freq: Every day | ORAL | Status: DC

## 2013-10-03 NOTE — Progress Notes (Signed)
[name], male   DOB: 07/10/1930, 79 y.o.   MRN: [id number] PCP: Dr. Pollock ([place])  79 yo with history of recurrent small bowel obstruction presents for followup after recent hospitalization.  In 9/14, patient was hospitalized with recurrent abdominal pain from small bowel obstruction.  This was managed conservatively with NG tube/NPO and IV fluid.  In the hospital, the patient developed pulmonary edema and actually ended up being intubated.  Troponin was normal.  Echo was done showing EF 35-40% with anteroseptal, anterolateral, and apical akinesis along with moderate AI.  He had no prior known cardiac disease.  It was thought that the patient had had an out of hospital MI at some point then developed pulmonary edema with IVF while NPO.  He was diuresed and extubated.  Creatinine was up to about 1.6 in the hospital so no cardiac cath was done (not thought to be urgent given normal creatinine).   Prior to admission, he was very active (push mowed lawn, etc).  Since discharge, he has felt "poorly" but has been trying to get back to his usual activities.  He can walk on flat ground without problem but does get short of breath walking up steps.  He has been more short of breath and "wheezy" this week, and he has also had a cough.  He has attributed all this to a "cold."  He was sent home from the hospital on Coreg and Lasix but has run out of these medications.  Creatinine was down to 1.4 when last checked.    Labs (9/14): K 3.4, creatinine 1.55 => 1.4, LDL 89, HDL 38  ECG: NSR, poor anterior R wave progression concerning for old anterior MI  PMH: 1. Nephrolithiasis 2. H/o CCY 3. H/o diverticulitis 4. H/o SBO: initially in 1/14 with small bowel resection, again in 9/14 treated conservatively.  5. Ischemic cardiomyopathy: Presumed.  Echo (9/14) with EF 35-40%, anteroseptal/anterolateral/apical akinesis and moderate AI.  Patient had acute pulmonary edema while hospitalized in 9/14.   6. CKD 7. Aortic insufficiency: Moderate on last echo  SH: Married (2nd time) with 4 children.  Lives in [place].  Previous heavy smoker.  Retired electrical contractor.   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   ROS: All systems reviewed and negative except as per HPI.   Current Outpatient Prescriptions  Medication Sig Dispense Refill  . CALCIUM-MAGNESIUM-ZINC PO Take 1 tablet by mouth daily.      . carboxymethylcellulose (REFRESH PLUS) 0.5 % SOLN Place 1 drop into both ears 3 (three) times daily as needed.      . carvedilol (COREG) 3.125 MG tablet Take 1 tablet (3.125 mg total) by mouth 2 (two) times daily with a meal.  60 tablet  3  . Cholecalciferol (VITAMIN D PO) Take 1 tablet by mouth daily.      . CINNAMON PO Take 1 tablet by mouth daily.      . Coenzyme Q10 (CO Q-10 PO) Take 1 capsule by mouth daily.      . Cyanocobalamin (VITAMIN B-12 PO) Take 1 tablet by mouth daily.      . fish oil-omega-3 fatty acids 1000 MG capsule Take 1 g by mouth daily.      . furosemide (LASIX) 20 MG tablet Take 1 tablet (20 mg total) by mouth daily.  30 tablet  3  . hydrocortisone cream 1 % Apply 1 application topically 2 (two) times daily as needed. Itching      . NIACIN PO Take 1   tablet by mouth daily.      . omeprazole (PRILOSEC) 20 MG capsule Take 20 mg by mouth daily.      . Pyridoxine HCl (VITAMIN B-6 PO) Take 1 tablet by mouth daily.      . Thiamine HCl (VITAMIN B-1 PO) Take 1 tablet by mouth daily.      . VITAMIN A PO Take 1 tablet by mouth daily.      . VITAMIN E PO Take 1 tablet by mouth daily.      . Ascorbic Acid (VITAMIN C PO) Take 1,000 mg by mouth daily.       . aspirin EC 81 MG tablet Take 1 tablet (81 mg total) by mouth daily.  90 tablet  3  . lisinopril (PRINIVIL,ZESTRIL) 2.5 MG tablet Take 1 tablet (2.5 mg total) by mouth daily.  30 tablet  3  . potassium chloride (K-DUR) 10 MEQ tablet 4 tablets (total 40 mEq) daily       No current facility-administered medications for this visit.    Facility-Administered Medications Ordered in Other Visits  Medication Dose Route Frequency Provider Last Rate Last Dose  . midazolam (VERSED) 5 MG/5ML injection    Anesthesia Intra-op Rebecca S Sullivan, CRNA   2 mg at 08/09/13 1146  . propofol (DIPRIVAN) 10 mg/mL bolus/IV push    Anesthesia Intra-op Rebecca S Sullivan, CRNA   20 mg at 08/09/13 1147    BP 124/76  Pulse 72  Ht 5' 9" (1.753 m)  Wt 154 lb (69.854 kg)  BMI 22.73 kg/m2  SpO2 96% General: NAD Neck: JVP 8-9 cm, no thyromegaly or thyroid nodule.  Lungs: Crackles at bases bilaterally. CV: Nondisplaced PMI.  Heart regular S1/S2, no S3/S4, 2/6 SEM RUSB, 1/6 diastolic murmur RUSB.  1+ ankle edema.  No carotid bruit.  Normal pedal pulses.  Abdomen: Soft, nontender, no hepatosplenomegaly, no distention.  Skin: Intact without lesions or rashes.  Neurologic: Alert and oriented x 3.  Psych: Normal affect. Extremities: No clubbing or cyanosis.   Assessment/Plan: 1. CHF: Chronic systolic CHF.  I suspect this is an ischemic cardiomyopathy based on ECG and echo.  However, stress (Takotsubo-type) cardiomyopathy is possible as well).  He is volume overloaded on exam today with probably NYHA class III symptoms.  He has run out of Coreg and Lasix.   - Start Lasix 20 mg daily + KCl 20 mEq daily.  - Restart Coreg 3.125 mg bid.  - Given improving creatinine, will start low dose ACEI with lisinopril 2.5 mg daily.  - Needs BMET in about a week.  - Will get CXR today given cough and wheezing.  2. CAD: Suspected based on ECG and echo.  He may have had an out of hospital MI prior to last hospitalization.  He had acute pulmonary edema in the setting of IV fluids but troponin was negative.  I do think that it would be helpful to document the presence and extent of CAD now that creatinine has improved.  - I will arrange for LHC.  Would avoid LV-gram with CKD.  - Start ASA 81 mg daily.   - Will check lipids and start statin if significant CAD is  found on cath.  3. CKD: Creatinine was elevated while NPO at last admission.  It was trending down when last checked.  Will check creatinine today.   Norleen Xie 10/03/2013    

## 2013-10-07 ENCOUNTER — Other Ambulatory Visit (INDEPENDENT_AMBULATORY_CARE_PROVIDER_SITE_OTHER): Payer: Medicare Other

## 2013-10-07 DIAGNOSIS — I5022 Chronic systolic (congestive) heart failure: Secondary | ICD-10-CM

## 2013-10-07 DIAGNOSIS — E876 Hypokalemia: Secondary | ICD-10-CM

## 2013-10-07 DIAGNOSIS — J811 Chronic pulmonary edema: Secondary | ICD-10-CM

## 2013-10-07 LAB — BASIC METABOLIC PANEL
BUN: 14 mg/dL (ref 6–23)
CO2: 30 mEq/L (ref 19–32)
Calcium: 8.6 mg/dL (ref 8.4–10.5)
Chloride: 103 mEq/L (ref 96–112)
Creatinine, Ser: 1.3 mg/dL (ref 0.4–1.5)
GFR: 54.86 mL/min — ABNORMAL LOW (ref >60.00)
Glucose, Bld: 99 mg/dL (ref 70–99)
Potassium: 3.7 mEq/L (ref 3.5–5.1)
Sodium: 139 mEq/L (ref 135–145)

## 2013-10-09 ENCOUNTER — Encounter (HOSPITAL_COMMUNITY)
Admission: RE | Disposition: A | Discharge status: Home / Self Care | Payer: Self-pay | Source: Ambulatory Visit | Attending: Cardiology

## 2013-10-09 ENCOUNTER — Encounter (HOSPITAL_COMMUNITY): Payer: Self-pay | Admitting: General Practice

## 2013-10-09 ENCOUNTER — Ambulatory Visit (HOSPITAL_COMMUNITY)
Admission: RE | Admit: 2013-10-09 | Discharge: 2013-10-10 | Disposition: A | Discharge status: Home / Self Care | Payer: Medicare Other | Source: Ambulatory Visit | Attending: Cardiology | Admitting: Cardiology

## 2013-10-09 DIAGNOSIS — Z87891 Personal history of nicotine dependence: Secondary | ICD-10-CM | POA: Insufficient documentation

## 2013-10-09 DIAGNOSIS — Z7982 Long term (current) use of aspirin: Secondary | ICD-10-CM | POA: Insufficient documentation

## 2013-10-09 DIAGNOSIS — E785 Hyperlipidemia, unspecified: Secondary | ICD-10-CM | POA: Insufficient documentation

## 2013-10-09 DIAGNOSIS — N183 Chronic kidney disease, stage 3 unspecified: Secondary | ICD-10-CM | POA: Insufficient documentation

## 2013-10-09 DIAGNOSIS — I5021 Acute systolic (congestive) heart failure: Secondary | ICD-10-CM

## 2013-10-09 DIAGNOSIS — I2510 Atherosclerotic heart disease of native coronary artery without angina pectoris: Secondary | ICD-10-CM

## 2013-10-09 DIAGNOSIS — I359 Nonrheumatic aortic valve disorder, unspecified: Secondary | ICD-10-CM | POA: Insufficient documentation

## 2013-10-09 DIAGNOSIS — I5022 Chronic systolic (congestive) heart failure: Secondary | ICD-10-CM

## 2013-10-09 DIAGNOSIS — I200 Unstable angina: Secondary | ICD-10-CM | POA: Insufficient documentation

## 2013-10-09 DIAGNOSIS — I2589 Other forms of chronic ischemic heart disease: Secondary | ICD-10-CM | POA: Insufficient documentation

## 2013-10-09 DIAGNOSIS — I509 Heart failure, unspecified: Secondary | ICD-10-CM | POA: Insufficient documentation

## 2013-10-09 DIAGNOSIS — I2089 Other forms of angina pectoris: Secondary | ICD-10-CM

## 2013-10-09 DIAGNOSIS — E876 Hypokalemia: Secondary | ICD-10-CM

## 2013-10-09 DIAGNOSIS — I208 Other forms of angina pectoris: Secondary | ICD-10-CM

## 2013-10-09 DIAGNOSIS — J811 Chronic pulmonary edema: Secondary | ICD-10-CM

## 2013-10-09 HISTORY — PX: CARDIAC CATHETERIZATION: SHX172

## 2013-10-09 HISTORY — PX: PERCUTANEOUS CORONARY STENT INTERVENTION (PCI-S): SHX5485

## 2013-10-09 HISTORY — DX: Hyperlipidemia, unspecified: E78.5

## 2013-10-09 HISTORY — DX: Chronic systolic (congestive) heart failure: I50.22

## 2013-10-09 HISTORY — DX: Atherosclerotic heart disease of native coronary artery without angina pectoris: I25.10

## 2013-10-09 HISTORY — PX: CORONARY ANGIOPLASTY: SHX604

## 2013-10-09 HISTORY — PX: LEFT HEART CATHETERIZATION WITH CORONARY ANGIOGRAM: SHX5451

## 2013-10-09 HISTORY — DX: Ischemic cardiomyopathy: I25.5

## 2013-10-09 LAB — POCT ACTIVATED CLOTTING TIME: Activated Clotting Time: 380 seconds

## 2013-10-09 SURGERY — LEFT HEART CATHETERIZATION WITH CORONARY ANGIOGRAM
Anesthesia: LOCAL

## 2013-10-09 MED ORDER — SODIUM CHLORIDE 0.9 % IV SOLN
INTRAVENOUS | Status: DC
  Administered 2013-10-09: 07:00:00 via INTRAVENOUS

## 2013-10-09 MED ORDER — CLOPIDOGREL BISULFATE 300 MG PO TABS
ORAL_TABLET | ORAL | Status: AC
  Filled 2013-10-09: qty 1

## 2013-10-09 MED ORDER — SODIUM CHLORIDE 0.9 % IV SOLN
INTRAVENOUS | Status: AC
  Administered 2013-10-09: 20:00:00 via INTRAVENOUS

## 2013-10-09 MED ORDER — CLOPIDOGREL BISULFATE 75 MG PO TABS
75.0000 mg | ORAL_TABLET | Freq: Every day | ORAL | Status: DC
  Administered 2013-10-10: 75 mg via ORAL
  Filled 2013-10-09: qty 1

## 2013-10-09 MED ORDER — SODIUM CHLORIDE 0.9 % IV SOLN
0.2500 mg/kg/h | INTRAVENOUS | Status: AC
  Filled 2013-10-09: qty 250

## 2013-10-09 MED ORDER — FENTANYL CITRATE 0.05 MG/ML IJ SOLN
INTRAMUSCULAR | Status: AC
  Filled 2013-10-09: qty 2

## 2013-10-09 MED ORDER — ACETAMINOPHEN 325 MG PO TABS: 650.0000 mg | ORAL_TABLET | ORAL | Status: DC | PRN

## 2013-10-09 MED ORDER — MIDAZOLAM HCL 2 MG/2ML IJ SOLN
INTRAMUSCULAR | Status: AC
  Filled 2013-10-09: qty 2

## 2013-10-09 MED ORDER — VERAPAMIL HCL 2.5 MG/ML IV SOLN
INTRAVENOUS | Status: AC
  Filled 2013-10-09: qty 2

## 2013-10-09 MED ORDER — SODIUM CHLORIDE 0.9 % IV SOLN: 250.0000 mL | INTRAVENOUS | Status: DC | PRN

## 2013-10-09 MED ORDER — ASPIRIN 81 MG PO CHEW
81.0000 mg | CHEWABLE_TABLET | ORAL | Status: AC
  Administered 2013-10-09: 81 mg via ORAL

## 2013-10-09 MED ORDER — ONDANSETRON HCL 4 MG/2ML IJ SOLN: 4.0000 mg | Freq: Four times a day (QID) | INTRAMUSCULAR | Status: DC | PRN

## 2013-10-09 MED ORDER — NITROGLYCERIN 0.2 MG/ML ON CALL CATH LAB
INTRAVENOUS | Status: AC
  Filled 2013-10-09: qty 1

## 2013-10-09 MED ORDER — SODIUM CHLORIDE 0.9 % IJ SOLN: 3.0000 mL | INTRAMUSCULAR | Status: DC | PRN

## 2013-10-09 MED ORDER — ASPIRIN 81 MG PO CHEW
CHEWABLE_TABLET | ORAL | Status: AC
  Filled 2013-10-09: qty 1

## 2013-10-09 MED ORDER — SODIUM CHLORIDE 0.9 % IJ SOLN: 3.0000 mL | Freq: Two times a day (BID) | INTRAMUSCULAR | Status: DC

## 2013-10-09 MED ORDER — HEPARIN (PORCINE) IN NACL 2-0.9 UNIT/ML-% IJ SOLN
INTRAMUSCULAR | Status: AC
  Filled 2013-10-09: qty 1000

## 2013-10-09 MED ORDER — ASPIRIN EC 325 MG PO TBEC
325.0000 mg | DELAYED_RELEASE_TABLET | Freq: Every day | ORAL | Status: DC
  Filled 2013-10-09: qty 1

## 2013-10-09 MED ORDER — BIVALIRUDIN 250 MG IV SOLR
INTRAVENOUS | Status: AC
  Filled 2013-10-09: qty 250

## 2013-10-09 MED ORDER — HEPARIN SODIUM (PORCINE) 1000 UNIT/ML IJ SOLN
INTRAMUSCULAR | Status: AC
  Filled 2013-10-09: qty 1

## 2013-10-09 MED ORDER — LIDOCAINE HCL (PF) 1 % IJ SOLN
INTRAMUSCULAR | Status: AC
  Filled 2013-10-09: qty 30

## 2013-10-09 NOTE — CV Procedure (Signed)
    Cardiac Catheterization Procedure Note  Name: [name] [name] MRN: [id number] DOB: Jun 05, 1930  Procedure: Left Heart Cath, Selective Coronary Angiography  Indication: Exertional dyspnea, wall motion abnormalities on echo with decreased EF.    Procedural Details: Allen's test on right wrist was positive.  The right wrist was prepped, draped, and anesthetized with 1% lidocaine. Using the modified Seldinger technique, a 5 French sheath was introduced into the right radial artery. 3 mg of verapamil was administered through the sheath, weight-based unfractionated heparin was administered intravenously. Standard Judkins catheters were used for selective coronary angiography. Catheter exchanges were performed over an exchange length guidewire. There were no immediate procedural complications. Patient will be evaluated now for PCI.   Procedural Findings: Hemodynamics: AO 158/75 LV 158/18  Coronary angiography: Coronary dominance: left  Left mainstem: Mild luminal irregularities.   Left anterior descending (LAD): There is a small, high diagonal with 50-60% proximal stenosis.  There is a tight 95% proximal LAD stenosis with calcification.  He has good flow down the LAD beyond the stenosis.   Left circumflex (LCx): 30% stenosis proximal LCx, large OM1 with diffuse up to 40% stenosis in the mid-vessel, moderate OM2 with 60-70% proximal stenosis.  The LCx provides a left-sided PDA.   Right coronary artery (RCA): Nondominant small right without significant disease.   Left ventriculography: Not done, CKD.   Final Conclusions:  Tight proximal LAD stenosis is the likely cause of his symptoms and wall motion abnormalities on echo.  Discussed with Dr. Katrinka Blazing, plan intervention today.   [name] 10/09/2013 8:30 AM

## 2013-10-09 NOTE — H&P (View-Only) (Signed)
[name], male   DOB: [birth year], 79 y.o.   MRN: [id number] PCP: Dr. Julius Bowels [facility])  79 yo with history of recurrent small bowel obstruction presents for followup after recent hospitalization.  In 9/14, patient was hospitalized with recurrent abdominal pain from small bowel obstruction.  This was managed conservatively with NG tube/NPO and IV fluid.  In the hospital, the patient developed pulmonary edema and actually ended up being intubated.  Troponin was normal.  Echo was done showing EF 35-40% with anteroseptal, anterolateral, and apical akinesis along with moderate AI.  He had no prior known cardiac disease.  It was thought that the patient had had an out of hospital MI at some point then developed pulmonary edema with IVF while NPO.  He was diuresed and extubated.  Creatinine was up to about 1.6 in the hospital so no cardiac cath was done (not thought to be urgent given normal creatinine).   Prior to admission, he was very active (push [employer], etc).  Since discharge, he has felt "poorly" but has been trying to get back to his usual activities.  He can walk on flat ground without problem but does get short of breath walking up steps.  He has been more short of breath and "wheezy" this week, and he has also had a cough.  He has attributed all this to a "cold."  He was sent home from the hospital on Coreg and Lasix but has run out of these medications.  Creatinine was down to 1.4 when last checked.    Labs (9/14): K 3.4, creatinine 1.55 => 1.4, LDL 89, HDL 38  ECG: NSR, poor anterior R wave progression concerning for old anterior MI  PMH: 1. Nephrolithiasis 2. H/o CCY 3. H/o diverticulitis 4. H/o SBO: initially in 1/14 with small bowel resection, again in 9/14 treated conservatively.  5. Ischemic cardiomyopathy: Presumed.  Echo (9/14) with EF 35-40%, anteroseptal/anterolateral/apical akinesis and moderate AI.  Patient had acute pulmonary edema while hospitalized in 9/14.   6. CKD 7. Aortic insufficiency: Moderate on last echo  SH: Married (2nd time) with 4 children.  Lives in [place].  Previous heavy smoker.  Retired Radio broadcast assistant.   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   ROS: All systems reviewed and negative except as per HPI.   Current Outpatient Prescriptions  Medication Sig Dispense Refill  . CALCIUM-MAGNESIUM-ZINC PO Take 1 tablet by mouth daily.      . carboxymethylcellulose (REFRESH PLUS) 0.5 % SOLN Place 1 drop into both ears 3 (three) times daily as needed.      . carvedilol (COREG) 3.125 MG tablet Take 1 tablet (3.125 mg total) by mouth 2 (two) times daily with a meal.  60 tablet  3  . Cholecalciferol (VITAMIN D PO) Take 1 tablet by mouth daily.      Marland Kitchen CINNAMON PO Take 1 tablet by mouth daily.      . Coenzyme Q10 (CO Q-10 PO) Take 1 capsule by mouth daily.      . Cyanocobalamin (VITAMIN B-12 PO) Take 1 tablet by mouth daily.      . fish oil-omega-3 fatty acids 1000 MG capsule Take 1 g by mouth daily.      . furosemide (LASIX) 20 MG tablet Take 1 tablet (20 mg total) by mouth daily.  30 tablet  3  . hydrocortisone cream 1 % Apply 1 application topically 2 (two) times daily as needed. Itching      . NIACIN PO Take 1  tablet by mouth daily.      Marland Kitchen omeprazole (PRILOSEC) 20 MG capsule Take 20 mg by mouth daily.      . Pyridoxine HCl (VITAMIN B-6 PO) Take 1 tablet by mouth daily.      . Thiamine HCl (VITAMIN B-1 PO) Take 1 tablet by mouth daily.      Marland Kitchen VITAMIN A PO Take 1 tablet by mouth daily.      Marland Kitchen VITAMIN E PO Take 1 tablet by mouth daily.      . Ascorbic Acid (VITAMIN C PO) Take 1,000 mg by mouth daily.       Marland Kitchen aspirin EC 81 MG tablet Take 1 tablet (81 mg total) by mouth daily.  90 tablet  3  . lisinopril (PRINIVIL,ZESTRIL) 2.5 MG tablet Take 1 tablet (2.5 mg total) by mouth daily.  30 tablet  3  . potassium chloride (K-DUR) 10 MEQ tablet 4 tablets (total 40 mEq) daily       No current facility-administered medications for this visit.    Facility-Administered Medications Ordered in Other Visits  Medication Dose Route Frequency Provider Last Rate Last Dose  . midazolam (VERSED) 5 MG/5ML injection    Anesthesia Intra-op Valeda Malm, CRNA   2 mg at 08/09/13 1146  . propofol (DIPRIVAN) 10 mg/mL bolus/IV push    Anesthesia Intra-op Valeda Malm, CRNA   20 mg at 08/09/13 1147    BP 124/76  Pulse 72  Ht 5\' 9"  (1.753 m)  Wt 154 lb (69.854 kg)  BMI 22.73 kg/m2  SpO2 96% General: NAD Neck: JVP 8-9 cm, no thyromegaly or thyroid nodule.  Lungs: Crackles at bases bilaterally. CV: Nondisplaced PMI.  Heart regular S1/S2, no S3/S4, 2/6 SEM RUSB, 1/6 diastolic murmur RUSB.  1+ ankle edema.  No carotid bruit.  Normal pedal pulses.  Abdomen: Soft, nontender, no hepatosplenomegaly, no distention.  Skin: Intact without lesions or rashes.  Neurologic: Alert and oriented x 3.  Psych: Normal affect. Extremities: No clubbing or cyanosis.   Assessment/Plan: 1. CHF: Chronic systolic CHF.  I suspect this is an ischemic cardiomyopathy based on ECG and echo.  However, stress (Takotsubo-type) cardiomyopathy is possible as well).  He is volume overloaded on exam today with probably NYHA class III symptoms.  He has run out of Coreg and Lasix.   - Start Lasix 20 mg daily + KCl 20 mEq daily.  - Restart Coreg 3.125 mg bid.  - Given improving creatinine, will start low dose ACEI with lisinopril 2.5 mg daily.  - Needs BMET in about a week.  - Will get CXR today given cough and wheezing.  2. CAD: Suspected based on ECG and echo.  He may have had an out of hospital MI prior to last hospitalization.  He had acute pulmonary edema in the setting of IV fluids but troponin was negative.  I do think that it would be helpful to document the presence and extent of CAD now that creatinine has improved.  - I will arrange for LHC.  Would avoid LV-gram with CKD.  - Start ASA 81 mg daily.   - Will check lipids and start statin if significant CAD is  found on cath.  3. CKD: Creatinine was elevated while NPO at last admission.  It was trending down when last checked.  Will check creatinine today.   [name] 10/03/2013

## 2013-10-09 NOTE — Progress Notes (Signed)
TR BAND REMOVAL  LOCATION:    right radial  DEFLATED PER PROTOCOL:    yes  TIME BAND OFF / DRESSING APPLIED:    1750   SITE UPON ARRIVAL:    Level 0  SITE AFTER BAND REMOVAL:    Level 0  REVERSE ALLEN'S TEST:     positive  CIRCULATION SENSATION AND MOVEMENT:    Within Normal Limits   yes  COMMENTS:   Tolerated procedure well

## 2013-10-09 NOTE — H&P (Signed)
  Was asked to evaluate the patient by Dr. Shirlee Latch. He has a history of acute heart failure and known to have an anterior wall motion abnormality on echo. Catheterization today demonstrated high-grade mid LAD disease beyond a heavily calcified proximal segment. No other significant coronary disease is present. He frequently has dyspnea without provocation. This is considered to be an anginal equivalent. This would therefore be classified as class III to class IV angina. Angiography demonstrated a segmental heavily calcified greater than 95% stenosis in the mid LAD. Dr. Shirlee Latch has requested that we perform percutaneous revascularization. This was discussed with the patient and a higher than usual risk related to proximal calcification. After some consideration we decided to move forward. We'll also mindful of renal insufficiency and the need to conserve contrast. The risk of stroke, death, myocardial infarction, emergency surgery, infection, limb ischemia, among others were discussed in detail with the patient while on the Cath Lab table.

## 2013-10-09 NOTE — Interval H&P Note (Signed)
Cath Lab Visit (complete for each Cath Lab visit)  Clinical Evaluation Leading to the Procedure:   ACS: no  Non-ACS:    Anginal Classification: CCS III  Anti-ischemic medical therapy: Minimal Therapy (1 class of medications)  Non-Invasive Test Results: No non-invasive testing performed  Prior CABG: No previous CABG      History and Physical Interval Note:  10/09/2013 7:48 AM  [name] [name]  has presented today for surgery, with the diagnosis of c/p  The various methods of treatment have been discussed with the patient and family. After consideration of risks, benefits and other options for treatment, the patient has consented to  Procedure(s): LEFT HEART CATHETERIZATION WITH CORONARY ANGIOGRAM (N/A) as a surgical intervention .  The patient's history has been reviewed, patient examined, no change in status, stable for surgery.  I have reviewed the patient's chart and labs.  Questions were answered to the patient's satisfaction.     Jhene Westmoreland [employer]

## 2013-10-09 NOTE — CV Procedure (Signed)
Percutaneous Coronary Intervention Report  [name] [name]  79 y.o.  male 07-21-1930  Procedure Date: 10/09/2013  Referring Physician: [name], M.D. Primary Cardiologist:: Same  INDICATIONS: Left ventricular systolic dysfunction, recurrent heart failure, spontaneously occurring episodes of dyspnea, and high grade proximal to mid LAD stenosis felt to represent the culprit lesion for the patient's spontaneous episodes of dyspnea/heart failure. The symptom complex would classify as crescendo angina/last III-IV  PROCEDURE: PCI with overlapping Drug Eluting stents proximal to mid LAD  CONSENT:  The risks, benefits, and details of the procedure were explained in detail to the patient. Risks including death, stroke, heart attack, kidney injury, allergy, limb ischemia, bleeding and radiation injury were discussed.  The patient verbalized understanding and wanted to proceed.  Informed written consent was obtained.  PROCEDURE TECHNIQUE:  After Xylocaine anesthesia the 5 French Slender sheath in the right artery was used to place an XB LAD 6 cm guide catheter. No difficulty with placement was noted. Guide shots were obtained. An Angiomax bolus and infusion was started. 600 mg of Plavix was loaded.  The procedure was felt to carry at least moderate risk because of heavy vessel calcification and tortuosity. We easily crossed the stenosis with a Pro-water 0.014 wire. I prophylactically placed a BMW 0.014 wire into the circumflex to provide guide catheter support. We predilated using a 2.5 x 15 mm long semi compliant balloon.. We then tried a position a 20 x 2.75 Promus Premier. This stent would not track through proximal tortuosity in the LAD. Because of this a buddy wire was placed down the LAD in addition to the guidewire being used as are rail for the procedure. Despite having this additional support the stent would not track. We then predilated the more proximal moderately diseased segment. This  region had to be aggressively dilated up to 3.0 mm with a noncompliant balloon. We were then able to positioned and deployed the [employer] stent. The plan was then to place a more proximal stent overlapping with the mid stent. After deployment of the mid stent, we could not advance a more proximal stent into the previously placed stent. More aggressive dilatation at the proximal stent margin with a 3.25 x 12 mm Baltic balloon to 14 atmospheres was performed. Again with the double buddy wire for support, we were eventually able to get a 20 x 3.0 Promus Premier stent to overlap. This stent was deployed back to the ostium of the LAD. High pressure post deployment balloon dilatation was performed from the mid portion of the distal stent back to the proximal portion of the overlapping stented region to 14 atmospheres using a 3.25 mm balloon. Postdilatation in the more distal mid stent was performed to high pressure using a 3.0 x 12 mm balloon to 14 atmospheres. A distal eccentric step up at the stent margin is concerning but brisk flow was noted. After some deliberation, my goal was to deploy a third stent overlapping with the distal margin of the previously placed stents. An 8 x 2.75 mm Promus Premier would not track to the midportion of the overlapping stents due to friction area the case was terminated. The patient remained stable throughout the procedure.  MEDICATIONS: Versed/fentanyl/Plavix/Angiomax    CONTRAST:  Total of 400 cc. (325 for the PCI)  COMPLICATIONS:  Large contrast volume.  RESULTS: 95% mid LAD calcified lesion reduced to 0 % after ballooning and overlapping stents in the proximal to mid vessel. Beyond the distal stent margin, an eccentric 50%  stenosis is noted which does not limit flow. I was unable to pass an 8 mm long stent beyond the mid vessel.  CONCLUSION: 1. Successful PCI of a heavily calcified proximal to mid LAD stenosis form 95% to 0%.  2. 50% eccentric stenosis beyond the  distal stent margin, without flow limitation. Unable to pass an 8 mm stent thru the proximal segment to treat.  PLAN: Aspirin and plavix, indefinite.  The distal lesion(beyond stents) will likely be treatable after some intimal growth decreases the frictional resistance in the stented segment. This will not be necessary if no symptoms occur.  Home in AM if no problems with renal function.  Aggressive hydration due to contrast load.

## 2013-10-10 ENCOUNTER — Other Ambulatory Visit: Payer: Self-pay | Admitting: Nurse Practitioner

## 2013-10-10 ENCOUNTER — Encounter (HOSPITAL_COMMUNITY): Payer: Self-pay | Admitting: Nurse Practitioner

## 2013-10-10 DIAGNOSIS — I2510 Atherosclerotic heart disease of native coronary artery without angina pectoris: Secondary | ICD-10-CM

## 2013-10-10 DIAGNOSIS — I509 Heart failure, unspecified: Secondary | ICD-10-CM

## 2013-10-10 DIAGNOSIS — E785 Hyperlipidemia, unspecified: Secondary | ICD-10-CM | POA: Diagnosis present

## 2013-10-10 DIAGNOSIS — N183 Chronic kidney disease, stage 3 unspecified: Secondary | ICD-10-CM

## 2013-10-10 DIAGNOSIS — I208 Other forms of angina pectoris: Secondary | ICD-10-CM

## 2013-10-10 DIAGNOSIS — I255 Ischemic cardiomyopathy: Secondary | ICD-10-CM | POA: Insufficient documentation

## 2013-10-10 DIAGNOSIS — I2089 Other forms of angina pectoris: Secondary | ICD-10-CM

## 2013-10-10 DIAGNOSIS — I5022 Chronic systolic (congestive) heart failure: Secondary | ICD-10-CM

## 2013-10-10 DIAGNOSIS — I200 Unstable angina: Secondary | ICD-10-CM

## 2013-10-10 LAB — BASIC METABOLIC PANEL
BUN: 14 mg/dL (ref 6–23)
CO2: 24 mEq/L (ref 19–32)
Calcium: 8.4 mg/dL (ref 8.4–10.5)
Chloride: 104 mEq/L (ref 96–112)
Creatinine, Ser: 1.16 mg/dL (ref 0.50–1.35)
GFR calc Af Amer: 67 mL/min — ABNORMAL LOW (ref >90)
GFR calc non Af Amer: 58 mL/min — ABNORMAL LOW (ref >90)
Glucose, Bld: 103 mg/dL — ABNORMAL HIGH (ref 70–99)
Potassium: 3.8 mEq/L (ref 3.5–5.1)
Sodium: 140 mEq/L (ref 135–145)

## 2013-10-10 LAB — CBC
HCT: 38.7 % — ABNORMAL LOW (ref 39.0–52.0)
Hemoglobin: 12.9 g/dL — ABNORMAL LOW (ref 13.0–17.0)
MCH: 29.3 pg (ref 26.0–34.0)
MCHC: 33.3 g/dL (ref 30.0–36.0)
MCV: 87.8 fL (ref 78.0–100.0)
Platelets: 209 10*3/uL (ref 150–400)
RBC: 4.41 MIL/uL (ref 4.22–5.81)
RDW: 14.7 % (ref 11.5–15.5)
WBC: 9.8 10*3/uL (ref 4.0–10.5)

## 2013-10-10 MED ORDER — LISINOPRIL 2.5 MG PO TABS: 2.5000 mg | ORAL_TABLET | Freq: Every day | ORAL | Status: DC

## 2013-10-10 MED ORDER — ATORVASTATIN CALCIUM 40 MG PO TABS
40.0000 mg | ORAL_TABLET | Freq: Every day | ORAL | Status: DC
  Filled 2013-10-10: qty 1

## 2013-10-10 MED ORDER — POTASSIUM CHLORIDE ER 20 MEQ PO TBCR: 20.0000 meq | EXTENDED_RELEASE_TABLET | Freq: Every day | ORAL | Status: DC

## 2013-10-10 MED ORDER — ASPIRIN EC 81 MG PO TBEC
81.0000 mg | DELAYED_RELEASE_TABLET | Freq: Every day | ORAL | Status: DC
  Administered 2013-10-10: 81 mg via ORAL
  Filled 2013-10-10: qty 1

## 2013-10-10 MED ORDER — POTASSIUM CHLORIDE CRYS ER 20 MEQ PO TBCR
20.0000 meq | EXTENDED_RELEASE_TABLET | Freq: Once | ORAL | Status: AC
  Administered 2013-10-10: 09:00:00 20 meq via ORAL
  Filled 2013-10-10: qty 1

## 2013-10-10 MED ORDER — FUROSEMIDE 20 MG PO TABS
20.0000 mg | ORAL_TABLET | Freq: Every day | ORAL | Status: DC
  Administered 2013-10-10: 10:00:00 20 mg via ORAL
  Filled 2013-10-10: qty 1

## 2013-10-10 MED ORDER — PANTOPRAZOLE SODIUM 40 MG PO TBEC: 40.0000 mg | DELAYED_RELEASE_TABLET | Freq: Every day | ORAL | Status: DC

## 2013-10-10 MED ORDER — ATORVASTATIN CALCIUM 40 MG PO TABS: 40.0000 mg | ORAL_TABLET | Freq: Every day | ORAL | Status: DC

## 2013-10-10 MED ORDER — CLOPIDOGREL BISULFATE 75 MG PO TABS: 75.0000 mg | ORAL_TABLET | Freq: Every day | ORAL | Status: DC

## 2013-10-10 MED ORDER — CARVEDILOL 3.125 MG PO TABS
3.1250 mg | ORAL_TABLET | Freq: Two times a day (BID) | ORAL | Status: DC
  Administered 2013-10-10: 3.125 mg via ORAL
  Filled 2013-10-10: qty 1

## 2013-10-10 NOTE — Discharge Summary (Signed)
Discharge Summary   [name],  MRN: [id number], DOB/AGE: 02-24-1930 79 y.o.  Admit date: 10/09/2013 Discharge date: 10/10/2013  Primary Care Provider: Albertina Senegal Primary Cardiologist: Golden Circle, MD   Discharge Diagnoses Principal Problem:   Unstable angina  **s/p PCI and DES to the LAD x 2 this admission.  Active Problems:   CAD (coronary artery disease)   Chronic kidney disease (CKD), stage III (moderate)   Chronic systolic CHF (congestive heart failure)   Hyperlipidemia  Allergies No Known Allergies  Procedures  Cardiac Catheterization and Percutaneous Coronary Intervention 11.20.2014  Hemodynamics: AO 158/75 LV 158/18  Coronary angiography: Coronary dominance: left  Left mainstem: Mild luminal irregularities.  Left anterior descending (LAD): There is a small, high diagonal with 50-60% proximal stenosis.  There is a tight 95% proximal LAD stenosis with calcification.  He has good flow down the LAD beyond the stenosis.     **The LAD was successfully stented using a 2.75 x 20 mm Promus Premier DES and a 3.0 x 20 mm Promus Premier DES.**  Left circumflex (LCx): 30% stenosis proximal LCx, large OM1 with diffuse up to 40% stenosis in the mid-vessel, moderate OM2 with 60-70% proximal stenosis.  The LCx provides a left-sided PDA.   Right coronary artery (RCA): Nondominant small right without significant disease.   Left ventriculography: Not done, CKD.   _____________   History of Present Illness  79 y/o male with a h/o cardiomyopathy which was diagnosed in 07/2013 while he was hospitalized with a small bowel obstruction and he developed pulmonary edema requiring intubation.  Echocardiogram showed and EF of 35-40% with anteroseptal, anterolateral, and apical akinesis.  Troponin was normal and thus it was felt that he likely had a prior out of hospital MI at some point.  Creatinine was elevated and so an initial conservative approach was planned.   Unfortunately, upon being seen in clinic recently, he c/o dyspnea on exertion and was felt to be volume overloaded.  His medications were adjusted and given his history, decision was made to pursue diagnostic catheterization.  Hospital Course  Patient presented to the [facility] cath lab on 11/20 and underwent diagnostic catheterization revealing severe proximal LAD disease and otherwise non-obstructive CAD.  Films were reviewed and decision was made to intervene upon the LAD.  He then underwent successful placement of 2 Promus Premier drug-eluting stents within the proximal LAD.  Patient tolerated procedure well and post-procedure, he has been ambulating without recurrent symptoms or limitations.  He will be discharged home today in good condition.  In the setting of chronic kidney disease, his lisinopril and lasix were held leading into his catheterization.  We have advised him to resume his lasix today and lisinopril tomorrow.  We have arranged for outpatient BMET in 1 week and cardiology f/u in 2 wks.  Discharge Vitals Blood pressure 146/78, pulse 78, temperature 97.7 F (36.5 C), temperature source Oral, resp. rate 18, height 5\' 9"  (1.753 m), weight 135 lb (61.236 kg), SpO2 95.00%.  Filed Weights   10/09/13 0637  Weight: 135 lb (61.236 kg)   Labs  CBC  Recent Labs  10/10/13 0625  WBC 9.8  HGB 12.9*  HCT 38.7*  MCV 87.8  PLT 209   Basic Metabolic Panel  Recent Labs  10/10/13 0625  NA 140  K 3.8  CL 104  CO2 24  GLUCOSE 103*  BUN 14  CREATININE 1.16  CALCIUM 8.4   Disposition  Pt is being discharged home today in good condition.  Follow-up Plans & Appointments  Follow-up Information   Follow up with [name], MD On 10/22/2013. (11:45 AM)    Specialty:  Cardiology   Contact information:   [address] [place] [address] [phone]       Follow up with [facility] On 10/17/2013. (anytime between 9am and 2pm for blood chemistry)     Contact information:   [address] [place] [address] [phone]      Follow up with Albertina Senegal, MD. (as scheduled.)    Specialty:  Internal Medicine   Contact information:   [address] [place] [address] [phone]      Discharge Medications    Medication List    STOP taking these medications       omeprazole 20 MG capsule  Commonly known as:  PRILOSEC      TAKE these medications       aspirin EC 81 MG tablet  Take 1 tablet (81 mg total) by mouth daily.     atorvastatin 40 MG tablet  Commonly known as:  LIPITOR  Take 1 tablet (40 mg total) by mouth daily at 6 PM.     carvedilol 3.125 MG tablet  Commonly known as:  COREG  Take 1 tablet (3.125 mg total) by mouth 2 (two) times daily with a meal.     clopidogrel 75 MG tablet  Commonly known as:  PLAVIX  Take 1 tablet (75 mg total) by mouth daily with breakfast.     furosemide 20 MG tablet  Commonly known as:  LASIX  Take 1 tablet (20 mg total) by mouth daily.     lisinopril 2.5 MG tablet  Commonly known as:  PRINIVIL,ZESTRIL  Take 1 tablet (2.5 mg total) by mouth daily.     NIACIN PO  Take 1 tablet by mouth daily.     pantoprazole 40 MG tablet  Commonly known as:  PROTONIX  Take 1 tablet (40 mg total) by mouth daily.     Potassium Chloride ER 20 MEQ Tbcr  Take 20 mEq by mouth daily.     VITAMIN B-1 PO  Take 1 tablet by mouth daily.     VITAMIN C PO  Take 1,000 mg by mouth daily.     VITAMIN E PO  Take 1 tablet by mouth daily.       Outstanding Labs/Studies  BMET next week.  Duration of Discharge Encounter   Greater than 30 minutes including physician time.  Signed, Nicolasa Ducking NP 10/10/2013, 10:57 AM

## 2013-10-10 NOTE — Progress Notes (Signed)
[name], male   DOB: 1930/01/24, 79 y.o.   MRN: [id number]   SUBJECTIVE: No complaints this morning, no chest pain.  Wants to go home.   Marland Kitchen aspirin EC  81 mg Oral Daily  . atorvastatin  40 mg Oral q1800  . carvedilol  3.125 mg Oral BID WC  . clopidogrel  75 mg Oral Q breakfast  . furosemide  20 mg Oral Daily  . potassium chloride  20 mEq Oral Once      Filed Vitals:   10/09/13 1948 10/09/13 2000 10/10/13 0001 10/10/13 0400  BP: 162/82  141/69 164/78  Pulse:  72 75 77  Temp:   97.7 F (36.5 C) 97.6 F (36.4 C)  TempSrc:   Oral Oral  Resp:   20 18  Height:      Weight:      SpO2:  94% 93% 95%    Intake/Output Summary (Last 24 hours) at 10/10/13 0737 Last data filed at 10/10/13 0500  Gross per 24 hour  Intake   2935 ml  Output   1326 ml  Net   1609 ml    LABS: Basic Metabolic Panel:  Recent Labs  14/78/29 1054 10/10/13 0625  NA 139 140  K 3.7 3.8  CL 103 104  CO2 30 24  GLUCOSE 99 103*  BUN 14 14  CREATININE 1.3 1.16  CALCIUM 8.6 8.4   Liver Function Tests: No results found for this basename: AST, ALT, ALKPHOS, BILITOT, PROT, ALBUMIN,  in the last 72 hours No results found for this basename: LIPASE, AMYLASE,  in the last 72 hours CBC:  Recent Labs  10/10/13 0625  WBC 9.8  HGB 12.9*  HCT 38.7*  MCV 87.8  PLT 209   Cardiac Enzymes: No results found for this basename: CKTOTAL, CKMB, CKMBINDEX, TROPONINI,  in the last 72 hours BNP: No components found with this basename: POCBNP,  D-Dimer: No results found for this basename: DDIMER,  in the last 72 hours Hemoglobin A1C: No results found for this basename: HGBA1C,  in the last 72 hours Fasting Lipid Panel: No results found for this basename: CHOL, HDL, LDLCALC, TRIG, CHOLHDL, LDLDIRECT,  in the last 72 hours Thyroid Function Tests: No results found for this basename: TSH, T4TOTAL, FREET3, T3FREE, THYROIDAB,  in the last 72 hours Anemia Panel: No results found for this basename:  VITAMINB12, FOLATE, FERRITIN, TIBC, IRON, RETICCTPCT,  in the last 72 hours  RADIOLOGY: Dg Chest 2 View  10/02/2013   CLINICAL DATA:  Ex-smoker with pulmonary edema.  EXAM: CHEST  2 VIEW  COMPARISON:  08/11/2013  FINDINGS: Midline trachea. Normal heart size with atherosclerosis in the transverse aorta. Trace bilateral pleural effusions. No pneumothorax. Left apical calcified granuloma. No lobar consolidation. Patchy lower lobe predominant interstitial thickening which is slightly increased. Mild bibasilar volume loss.  Mildly prominent gas-filled loops of bowel in the upper abdomen.  IMPRESSION: Slight increase in lower lobe predominant interstitial thickening, suspicious for mild interstitial edema.  Small bilateral pleural effusions.  Prominent gas-filled bowel loops in the upper abdomen. Correlate with abdominal complaints.   Electronically Signed   By: Jeronimo Greaves M.D.   On: 10/02/2013 16:34    PHYSICAL EXAM General: NAD Neck: JVP 10 cm, no thyromegaly or thyroid nodule.  Lungs: Clear to auscultation bilaterally with normal respiratory effort. CV: Nondisplaced PMI.  Heart regular S1/S2, no S3/S4, no murmur.  No peripheral edema.  No carotid bruit.  Normal pedal pulses.  Abdomen: Soft, nontender,  no hepatosplenomegaly, no distention.  Neurologic: Alert and oriented x 3.  Psych: Normal affect. Extremities: No clubbing or cyanosis.   TELEMETRY: Reviewed telemetry pt in normal rhythm  ASSESSMENT AND PLAN: 79 yo with newly noted cardiomyopathy and exertional dyspnea had LHC yesterday showing tight LAD stenosis.  He had PCI with DES.  1. CAD: PCI with DES to proximal LAD yesterday.  Considerable contrast was used but creatinine is normal today after hydration.  He will continue ASA 81 and Plavix 75 long-term. I will start him on atorvastatin 80 mg daily.  2. Ischemic cardiomyopathy: EF 35-40% with periapical wall motion abnormalities.  He is mildly volume overloaded today after IV fluid  hydration.  No dyspnea.   - Restart Lasix 20 mg daily and Coreg.  - Would hold lisinopril until tomorrow.  3. Disposition: Can go home today.  Needs followup with me in 2 weeks.  Needs BMET next week however.  Meds: ASA 81, Plavix 75, atorvastatin 40, lisinopril 2.5 daily (start tomorrow), Coreg 3.125 mg bid, Lasix 20 daily, KCl 20 daily.   [name] 10/10/2013 7:44 AM

## 2013-10-10 NOTE — Progress Notes (Signed)
CHF education packet reviewed w/ pt, CHF video viewed.  Questions answered.  Pt voices understanding.

## 2013-10-10 NOTE — Progress Notes (Signed)
CARDIAC REHAB PHASE I   PRE:  Rate/Rhythm: 87SR  BP:  Supine: 146/78  Sitting:   Standing:    SaO2:   MODE:  Ambulation: 600 ft   POST:  Rate/Rhythm: 106ST  BP:  Supine:   Sitting: 158/80  Standing:    SaO2: 94%RA 0850-0946 Pt walked 600 ft on RA with steady gait. Tolerated well. No CP. Education completed. Discussed CRP 2. Pt declined. States very active and wants to exercise on his own. Reviewed daily weights, watching sodium and zones of when to call MD. Pt has viewed CHF video.   Luetta Nutting, RN BSN  10/10/2013 9:39 AM

## 2013-10-14 ENCOUNTER — Telehealth: Payer: Self-pay | Admitting: Cardiology

## 2013-10-14 NOTE — Telephone Encounter (Signed)
New message     Talk to a nurse regarding next appt for Dr [name] [name] about some things she want the dr to know but do not want her dad to know she told the dr.

## 2013-10-14 NOTE — Telephone Encounter (Signed)
Answered daughter, Bev's questions.

## 2013-10-17 ENCOUNTER — Other Ambulatory Visit (INDEPENDENT_AMBULATORY_CARE_PROVIDER_SITE_OTHER): Payer: Medicare Other

## 2013-10-17 DIAGNOSIS — N183 Chronic kidney disease, stage 3 unspecified: Secondary | ICD-10-CM

## 2013-10-17 LAB — BASIC METABOLIC PANEL
BUN: 27 mg/dL — ABNORMAL HIGH (ref 6–23)
CO2: 24 mEq/L (ref 19–32)
Calcium: 9 mg/dL (ref 8.4–10.5)
Chloride: 103 mEq/L (ref 96–112)
Creatinine, Ser: 1.6 mg/dL — ABNORMAL HIGH (ref 0.4–1.5)
GFR: 45.63 mL/min — ABNORMAL LOW (ref >60.00)
Glucose, Bld: 95 mg/dL (ref 70–99)
Potassium: 4.9 mEq/L (ref 3.5–5.1)
Sodium: 134 mEq/L — ABNORMAL LOW (ref 135–145)

## 2013-10-22 ENCOUNTER — Encounter: Payer: Self-pay | Admitting: Cardiology

## 2013-10-22 ENCOUNTER — Encounter: Payer: Self-pay | Admitting: *Deleted

## 2013-10-22 ENCOUNTER — Ambulatory Visit (INDEPENDENT_AMBULATORY_CARE_PROVIDER_SITE_OTHER): Payer: Medicare Other | Admitting: Cardiology

## 2013-10-22 VITALS — BP 114/68 | HR 75 | Ht 68.0 in | Wt 140.0 lb

## 2013-10-22 DIAGNOSIS — I2510 Atherosclerotic heart disease of native coronary artery without angina pectoris: Secondary | ICD-10-CM

## 2013-10-22 DIAGNOSIS — I509 Heart failure, unspecified: Secondary | ICD-10-CM

## 2013-10-22 DIAGNOSIS — E785 Hyperlipidemia, unspecified: Secondary | ICD-10-CM

## 2013-10-22 DIAGNOSIS — N183 Chronic kidney disease, stage 3 unspecified: Secondary | ICD-10-CM

## 2013-10-22 DIAGNOSIS — I5022 Chronic systolic (congestive) heart failure: Secondary | ICD-10-CM

## 2013-10-22 MED ORDER — CARVEDILOL 6.25 MG PO TABS: 6.2500 mg | ORAL_TABLET | Freq: Two times a day (BID) | ORAL | Status: DC

## 2013-10-22 MED ORDER — FUROSEMIDE 20 MG PO TABS: ORAL_TABLET | ORAL | Status: DC

## 2013-10-22 MED ORDER — LOSARTAN POTASSIUM 25 MG PO TABS: ORAL_TABLET | ORAL | Status: DC

## 2013-10-22 NOTE — Patient Instructions (Signed)
Stop lisinopril.  Start losartan 12.5mg  daily. This will be 1/2 of a 25mg  tablet daily.   Decrease lasix(furosemide) to 20mg  every other day.   Stop KCL(potassium).  Increase coreg (carvedilol) to 6.25mg  two times a day. You can take 2 of your 3.125mg  tablets two times a day and use your current supply.  You have been referred to Cardiac Rehab at [facility].   Your physician recommends that you return for lab work in: 2 weeks--BMET/BNP.  Your physician recommends that you schedule a follow-up appointment in: February 2015 with Dr Shirlee Latch.  Your physician recommends that you return for a FASTING lipid profile: February 2015 before you see Dr Shirlee Latch.  Your physician has requested that you have an echocardiogram. Echocardiography is a painless test that uses sound waves to create images of your heart. It provides your doctor with information about the size and shape of your heart and how well your heart's chambers and valves are working. This procedure takes approximately one hour. There are no restrictions for this procedure. February 2015 before you see Dr Shirlee Latch.

## 2013-10-22 NOTE — Progress Notes (Signed)
[name], male   DOB: 1930/01/11, 79 y.o.   MRN: [id number] PCP: Dr. Julius Bowels [facility])  79 yo with history of recurrent small bowel obstruction and CAD with ischemic cardiomyopathy presents for followup.  In 9/14, patient was hospitalized with recurrent abdominal pain from small bowel obstruction.  This was managed conservatively with NG tube/NPO and IV fluid.  In the hospital, the patient developed pulmonary edema and actually ended up being intubated.  Troponin was normal. Echo was done showing EF 35-40% with anteroseptal, anterolateral, and apical akinesis along with moderate AI.  He had no prior known cardiac disease.  It was thought that the patient had had an out of hospital MI at some point then developed pulmonary edema with IVF while NPO.  He was diuresed and extubated.  Creatinine was up to about 1.6 in the hospital so no cardiac cath was done (not thought to be urgent given normal creatinine).   Prior to that admission, he was very active (push [employer], etc). Since then, he had been short of breath with moderate exertion such as walking up steps.  I took him for [facility].  This showed 95% mid LAD stenosis that was treated with 2 overlapping Promus DES.  Since then, he has not been very active but denies exertional dyspnea or chest pain.  At last appointment, I started him on Lasix 20 mg daily, and he has lost 14 lbs.  He has developed a dry cough since starting lisinopril.   Labs (9/14): K 3.4, creatinine 1.55 => 1.4, LDL 89, HDL 38 Labs (11/14): K 4.9, creatinine 1.6  PMH: 1. Nephrolithiasis 2. H/o CCY 3. H/o diverticulitis 4. H/o SBO: initially in 1/14 with small bowel resection, again in 9/14 treated conservatively.  5. Ischemic cardiomyopathy: Echo (9/14) with EF 35-40%, anteroseptal/anterolateral/apical akinesis and moderate AI.  Patient had acute pulmonary edema while hospitalized in 9/14.  6. CKD 7. Aortic insufficiency: Moderate on last echo 8. CAD: LHC (11/14)  with 95% mLAD stenosis treated with 2 overlapping Promus DES.  There was residual 50% stenosis beyond the stent margin.    SH: Married (2nd time) with 4 children.  Lives in [place].  Previous heavy smoker.  Retired Radio broadcast assistant.   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   ROS: All systems reviewed and negative except as per HPI.   Current Outpatient Prescriptions  Medication Sig Dispense Refill  . Ascorbic Acid (VITAMIN C PO) Take 1,000 mg by mouth daily.       Marland Kitchen aspirin EC 81 MG tablet Take 1 tablet (81 mg total) by mouth daily.  90 tablet  3  . atorvastatin (LIPITOR) 40 MG tablet Take 1 tablet (40 mg total) by mouth daily at 6 PM.  30 tablet  6  . clopidogrel (PLAVIX) 75 MG tablet Take 1 tablet (75 mg total) by mouth daily with breakfast.  30 tablet  6  . NIACIN PO Take 1 tablet by mouth daily.      . pantoprazole (PROTONIX) 40 MG tablet Take 1 tablet (40 mg total) by mouth daily.  30 tablet  6  . Thiamine HCl (VITAMIN B-1 PO) Take 1 tablet by mouth daily.      Marland Kitchen VITAMIN E PO Take 1 tablet by mouth daily.      . carvedilol (COREG) 6.25 MG tablet Take 1 tablet (6.25 mg total) by mouth 2 (two) times daily.  60 tablet  3  . furosemide (LASIX) 20 MG tablet 1 tablet every other  day  30 tablet  3  . losartan (COZAAR) 25 MG tablet 1/2 tablet (total 12.5mg ) daily  30 tablet  3   No current facility-administered medications for this visit.   Facility-Administered Medications Ordered in Other Visits  Medication Dose Route Frequency Provider Last Rate Last Dose  . midazolam (VERSED) 5 MG/5ML injection    Anesthesia Intra-op Valeda Malm, CRNA   2 mg at 08/09/13 1146  . propofol (DIPRIVAN) 10 mg/mL bolus/IV push    Anesthesia Intra-op Valeda Malm, CRNA   20 mg at 08/09/13 1147    BP 114/68  Pulse 75  Ht 5\' 8"  (1.727 m)  Wt 140 lb (63.504 kg)  BMI 21.29 kg/m2 General: NAD Neck: JVP 7 cm, no thyromegaly or thyroid nodule.  Lungs: Clear bilaterally CV: Nondisplaced PMI.   Heart regular S1/S2, no S3/S4, 2/6 SEM RUSB, 1/6 diastolic murmur RUSB.  No edema.  No carotid bruit.  Normal pedal pulses.  Abdomen: Soft, nontender, no hepatosplenomegaly, no distention.  Skin: Intact without lesions or rashes.  Neurologic: Alert and oriented x 3.  Psych: Normal affect. Extremities: No clubbing or cyanosis.   Assessment/Plan: 1. Chronic systolic CHF: Ischemic cardiomyopathy, EF 35-40%. He looks euvolemic today.  - Decrease Lasix to 20 mg every other day and stop KCl. - Increase Coreg to 6.25 mg bid.  - Cough may be due to lisinopril.  Stop lisinopril, start losartan 12.5 mg daily.  - BMET/BNP in 2 wks.  - Repeat echo in 2/14.    2. CAD:  He may have had an out of hospital MI prior to last hospitalization.  He had acute pulmonary edema in the setting of IV fluids but troponin was negative.  Now s/p DES to mLAD with overlapping Promus stents.  Of note, there was residual 50% stenosis just beyond the stent margin.    - Continue ASA, Plavix and statin.  - I recommended that he do cardiac rehab.  3. Hyperlipidemia: Continue atorvastatin, check lipids in 2/14.  3. CKD: Last creatinine 1.6. Will get BMET in 2 wks as above.   [name] 10/22/2013

## 2013-11-05 ENCOUNTER — Other Ambulatory Visit: Payer: Self-pay | Admitting: *Deleted

## 2013-11-05 ENCOUNTER — Other Ambulatory Visit (INDEPENDENT_AMBULATORY_CARE_PROVIDER_SITE_OTHER): Payer: Medicare Other

## 2013-11-05 DIAGNOSIS — I2510 Atherosclerotic heart disease of native coronary artery without angina pectoris: Secondary | ICD-10-CM

## 2013-11-05 DIAGNOSIS — I5022 Chronic systolic (congestive) heart failure: Secondary | ICD-10-CM

## 2013-11-05 DIAGNOSIS — E876 Hypokalemia: Secondary | ICD-10-CM

## 2013-11-05 LAB — BASIC METABOLIC PANEL
BUN: 14 mg/dL (ref 6–23)
CO2: 27 mEq/L (ref 19–32)
Calcium: 7.9 mg/dL — ABNORMAL LOW (ref 8.4–10.5)
Chloride: 105 mEq/L (ref 96–112)
Creatinine, Ser: 1.3 mg/dL (ref 0.4–1.5)
GFR: 57.33 mL/min — ABNORMAL LOW (ref >60.00)
Glucose, Bld: 94 mg/dL (ref 70–99)
Potassium: 3.3 mEq/L — ABNORMAL LOW (ref 3.5–5.1)
Sodium: 139 mEq/L (ref 135–145)

## 2013-11-05 LAB — BRAIN NATRIURETIC PEPTIDE: Pro B Natriuretic peptide (BNP): 142 pg/mL — ABNORMAL HIGH (ref 0.0–100.0)

## 2013-11-05 MED ORDER — POTASSIUM CHLORIDE CRYS ER 20 MEQ PO TBCR: 20.0000 meq | EXTENDED_RELEASE_TABLET | Freq: Every day | ORAL | Status: DC

## 2013-11-21 ENCOUNTER — Other Ambulatory Visit (INDEPENDENT_AMBULATORY_CARE_PROVIDER_SITE_OTHER): Payer: Managed Care, Other (non HMO)

## 2013-11-21 DIAGNOSIS — E876 Hypokalemia: Secondary | ICD-10-CM

## 2013-11-21 LAB — BASIC METABOLIC PANEL
BUN: 17 mg/dL (ref 6–23)
CO2: 29 mEq/L (ref 19–32)
Calcium: 8.5 mg/dL (ref 8.4–10.5)
Chloride: 105 mEq/L (ref 96–112)
Creatinine, Ser: 1.4 mg/dL (ref 0.4–1.5)
GFR: 50.44 mL/min — ABNORMAL LOW (ref >60.00)
Glucose, Bld: 104 mg/dL — ABNORMAL HIGH (ref 70–99)
Potassium: 4.1 mEq/L (ref 3.5–5.1)
Sodium: 141 mEq/L (ref 135–145)

## 2013-12-01 ENCOUNTER — Telehealth: Payer: Self-pay | Admitting: Cardiology

## 2013-12-01 NOTE — Telephone Encounter (Signed)
New Prob   Pt has a few questions regarding his Potassium prescription. Please call.

## 2013-12-01 NOTE — Telephone Encounter (Signed)
Spoke with patient about potassium prescription.

## 2013-12-22 ENCOUNTER — Ambulatory Visit (HOSPITAL_COMMUNITY): Payer: Managed Care, Other (non HMO) | Attending: Cardiology | Admitting: Cardiology

## 2013-12-22 ENCOUNTER — Other Ambulatory Visit (INDEPENDENT_AMBULATORY_CARE_PROVIDER_SITE_OTHER): Payer: Managed Care, Other (non HMO)

## 2013-12-22 ENCOUNTER — Encounter: Payer: Self-pay | Admitting: Interventional Cardiology

## 2013-12-22 DIAGNOSIS — I359 Nonrheumatic aortic valve disorder, unspecified: Secondary | ICD-10-CM | POA: Insufficient documentation

## 2013-12-22 DIAGNOSIS — I5022 Chronic systolic (congestive) heart failure: Secondary | ICD-10-CM

## 2013-12-22 DIAGNOSIS — I2510 Atherosclerotic heart disease of native coronary artery without angina pectoris: Secondary | ICD-10-CM

## 2013-12-22 DIAGNOSIS — I2589 Other forms of chronic ischemic heart disease: Secondary | ICD-10-CM | POA: Insufficient documentation

## 2013-12-22 DIAGNOSIS — I509 Heart failure, unspecified: Secondary | ICD-10-CM | POA: Insufficient documentation

## 2013-12-22 DIAGNOSIS — I059 Rheumatic mitral valve disease, unspecified: Secondary | ICD-10-CM | POA: Insufficient documentation

## 2013-12-22 DIAGNOSIS — E785 Hyperlipidemia, unspecified: Secondary | ICD-10-CM | POA: Insufficient documentation

## 2013-12-22 LAB — LIPID PANEL
Cholesterol: 77 mg/dL (ref 0–200)
HDL: 32 mg/dL — ABNORMAL LOW (ref >39.00)
LDL Cholesterol: 34 mg/dL (ref 0–99)
Total CHOL/HDL Ratio: 2
Triglycerides: 54 mg/dL (ref 0.0–149.0)
VLDL: 10.8 mg/dL (ref 0.0–40.0)

## 2013-12-22 NOTE — Progress Notes (Signed)
Echo performed. 

## 2013-12-26 ENCOUNTER — Encounter: Payer: Self-pay | Admitting: Cardiology

## 2013-12-26 ENCOUNTER — Ambulatory Visit: Payer: Medicare Other | Admitting: Cardiology

## 2013-12-26 ENCOUNTER — Ambulatory Visit (INDEPENDENT_AMBULATORY_CARE_PROVIDER_SITE_OTHER): Payer: Managed Care, Other (non HMO) | Admitting: Cardiology

## 2013-12-26 VITALS — BP 122/68 | HR 65 | Ht 69.0 in | Wt 146.0 lb

## 2013-12-26 DIAGNOSIS — I5022 Chronic systolic (congestive) heart failure: Secondary | ICD-10-CM

## 2013-12-26 DIAGNOSIS — I509 Heart failure, unspecified: Secondary | ICD-10-CM

## 2013-12-26 DIAGNOSIS — N183 Chronic kidney disease, stage 3 unspecified: Secondary | ICD-10-CM

## 2013-12-26 DIAGNOSIS — I2510 Atherosclerotic heart disease of native coronary artery without angina pectoris: Secondary | ICD-10-CM

## 2013-12-26 MED ORDER — SPIRONOLACTONE 25 MG PO TABS: ORAL_TABLET | ORAL | Status: DC

## 2013-12-26 MED ORDER — CARVEDILOL 12.5 MG PO TABS: 12.5000 mg | ORAL_TABLET | Freq: Two times a day (BID) | ORAL | Status: DC

## 2013-12-26 NOTE — Patient Instructions (Signed)
Stop KCL(potassium).  Start spironolactone 12.5mg  daily. This will be 1/2 of a 25mg  tablet daily.  Increase coreg (carvedilol) to 12.5mg  two times a day. You can take 2 of your 6.25mg  tablets two times a day and use your current supply.  Your physician recommends that you return for lab work in: 2 weeks--BMET.   Your physician recommends that you schedule a follow-up appointment in: 3 months with Dr Aundra Dubin.

## 2013-12-28 NOTE — Progress Notes (Signed)
[name], male   DOB: 1930/01/09, 80 y.o.   MRN: [id number] PCP: Dr. Sallyanne Havers [facility])  80 yo with history of recurrent small bowel obstruction and CAD with ischemic cardiomyopathy presents for followup.  In 9/14, patient was hospitalized with recurrent abdominal pain from small bowel obstruction.  This was managed conservatively with NG tube/NPO and IV fluid.  In the hospital, the patient developed pulmonary edema and actually ended up being intubated.  Troponin was normal. Echo was done showing EF 35-40% with anteroseptal, anterolateral, and apical akinesis along with moderate AI.  He had no prior known cardiac disease.  It was thought that the patient had had an out of hospital MI at some point then developed pulmonary edema with IVF while NPO.  He was diuresed and extubated.  Creatinine was up to about 1.6 in the hospital so no cardiac cath was done (not thought to be urgent given normal creatinine).   I took him for [facility] in 11/14.  This showed 95% mid LAD stenosis that was treated with 2 overlapping Promus DES.    He has been doing well recently.  He is in cardiac rehab at the hospital in [facility].  He denies exertional chest pain or dyspnea.  Echo in 1/15 shosed EF 30-35% with normal RV.   Labs (9/14): K 3.4, creatinine 1.55 => 1.4, LDL 89, HDL 38 Labs (11/14): K 4.9, creatinine 1.6 Labs (1/15): K 4.1, creatinine 1.4 Labs (2/15): LDL 34, HDL 32  PMH: 1. Nephrolithiasis 2. H/o CCY 3. H/o diverticulitis 4. H/o SBO: initially in 1/14 with small bowel resection, again in 9/14 treated conservatively.  5. Ischemic cardiomyopathy: Echo (9/14) with EF 35-40%, anteroseptal/anterolateral/apical akinesis and moderate AI.  Patient had acute pulmonary edema while hospitalized in 9/14. Echo (1/15) with EF 30-35% with wall motion abnormalities, normal RV size and systolic function, mild MR and mild AI.  6. CKD 7. Aortic insufficiency: mild on last echo 8. CAD: LHC (11/14) with 95% mLAD  stenosis treated with 2 overlapping Promus DES.  There was residual 50% stenosis beyond the stent margin.    SH: Married (2nd time) with 4 children.  Lives in [place].  Previous heavy smoker.  Retired Chief Financial Officer.   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   ROS: All systems reviewed and negative except as per HPI.   Current Outpatient Prescriptions  Medication Sig Dispense Refill  . Ascorbic Acid (VITAMIN C PO) Take 1,000 mg by mouth daily.       Marland Kitchen aspirin EC 81 MG tablet Take 1 tablet (81 mg total) by mouth daily.  90 tablet  3  . atorvastatin (LIPITOR) 40 MG tablet Take 1 tablet (40 mg total) by mouth daily at 6 PM.  30 tablet  6  . clopidogrel (PLAVIX) 75 MG tablet Take 1 tablet (75 mg total) by mouth daily with breakfast.  30 tablet  6  . furosemide (LASIX) 20 MG tablet 1 tablet every other day  30 tablet  3  . losartan (COZAAR) 25 MG tablet 1/2 tablet (total 12.5mg ) daily  30 tablet  3  . NIACIN PO Take 1 tablet by mouth daily.      . pantoprazole (PROTONIX) 40 MG tablet Take 1 tablet (40 mg total) by mouth daily.  30 tablet  6  . Thiamine HCl (VITAMIN B-1 PO) Take 1 tablet by mouth daily.      . carvedilol (COREG) 12.5 MG tablet Take 1 tablet (12.5 mg total) by mouth 2 (  two) times daily.  60 tablet  6  . spironolactone (ALDACTONE) 25 MG tablet 1/2 tablet (12.5mg ) daily  15 tablet  6  . VITAMIN E PO Take 1 tablet by mouth daily.       No current facility-administered medications for this visit.   Facility-Administered Medications Ordered in Other Visits  Medication Dose Route Frequency Provider Last Rate Last Dose  . midazolam (VERSED) 5 MG/5ML injection    Anesthesia Intra-op Sherry Ruffing, CRNA   2 mg at 08/09/13 1146  . propofol (DIPRIVAN) 10 mg/mL bolus/IV push    Anesthesia Intra-op Sherry Ruffing, CRNA   20 mg at 08/09/13 1147    BP 122/68  Pulse 65  Ht 5\' 9"  (1.753 m)  Wt 66.225 kg (146 lb)  BMI 21.55 kg/m2 General: NAD Neck: JVP 7 cm, no thyromegaly  or thyroid nodule.  Lungs: Clear bilaterally CV: Nondisplaced PMI.  Heart regular S1/S2, no S3/S4, 1/6 SEM RUSB.  No edema.  No carotid bruit.  Normal pedal pulses.  Abdomen: Soft, nontender, no hepatosplenomegaly, no distention.  Skin: Intact without lesions or rashes.  Neurologic: Alert and oriented x 3.  Psych: Normal affect. Extremities: No clubbing or cyanosis.   Assessment/Plan: 1. Chronic systolic CHF: Ischemic cardiomyopathy, EF 30-35%. He looks euvolemic today.  NYHA class I-II symptoms.  - Continue current Lasix and losartan.  - Increase Coreg to 12.5 mg bid.  - Start spironolactone 12.5 mg daily and can stop KCl.   - BMET in 2 wks.  - Would avoid ICD given age.     2. CAD:  He may have had an out of hospital MI prior to last hospitalization in the fall.  Now s/p DES to mLAD with overlapping Promus stents.  Of note, there was residual 50% stenosis just beyond the stent margin.    - Continue ASA, Plavix and statin.  - Continue cardiac rehab.  3. Hyperlipidemia: Continue atorvastatin, good lipids in 2/15.   3. CKD: Stable.   Loralie Champagne 12/28/2013

## 2014-04-28 ENCOUNTER — Other Ambulatory Visit (HOSPITAL_COMMUNITY): Payer: Self-pay | Admitting: Nurse Practitioner

## 2014-05-04 ENCOUNTER — Telehealth: Payer: Self-pay | Admitting: Cardiology

## 2014-05-04 NOTE — Telephone Encounter (Signed)
Pt's daughter, Rise Paganini states pt thinks his BP has been running too low.

## 2014-05-04 NOTE — Telephone Encounter (Signed)
New problem   Pt thinks his bottom number on his bp is low. Please call pt's daughter

## 2014-05-04 NOTE — Telephone Encounter (Signed)
She states pt's BP has been about 120s/50-60s. Pt is tired, for several months, not new, denies lightheadedness or dizziness or other symptoms. Pt's daughter is going to take and record BP and HR. I have scheduled pt to see Dr Aundra Dubin 06/09/14 and daughter will bring record of BP and HR.

## 2014-05-15 ENCOUNTER — Other Ambulatory Visit (HOSPITAL_COMMUNITY): Payer: Self-pay | Admitting: Nurse Practitioner

## 2014-06-02 ENCOUNTER — Encounter: Payer: Self-pay | Admitting: *Deleted

## 2014-06-09 ENCOUNTER — Encounter (INDEPENDENT_AMBULATORY_CARE_PROVIDER_SITE_OTHER): Payer: Self-pay

## 2014-06-09 ENCOUNTER — Ambulatory Visit (INDEPENDENT_AMBULATORY_CARE_PROVIDER_SITE_OTHER): Payer: Managed Care, Other (non HMO) | Admitting: Cardiology

## 2014-06-09 ENCOUNTER — Encounter: Payer: Self-pay | Admitting: Cardiology

## 2014-06-09 VITALS — BP 127/72 | HR 51 | Ht 69.0 in | Wt 150.6 lb

## 2014-06-09 DIAGNOSIS — I5022 Chronic systolic (congestive) heart failure: Secondary | ICD-10-CM

## 2014-06-09 DIAGNOSIS — I2589 Other forms of chronic ischemic heart disease: Secondary | ICD-10-CM

## 2014-06-09 DIAGNOSIS — I255 Ischemic cardiomyopathy: Secondary | ICD-10-CM

## 2014-06-09 DIAGNOSIS — I509 Heart failure, unspecified: Secondary | ICD-10-CM

## 2014-06-09 DIAGNOSIS — I2510 Atherosclerotic heart disease of native coronary artery without angina pectoris: Secondary | ICD-10-CM

## 2014-06-09 MED ORDER — CARVEDILOL 6.25 MG PO TABS: 6.2500 mg | ORAL_TABLET | Freq: Two times a day (BID) | ORAL | Status: DC

## 2014-06-09 NOTE — Patient Instructions (Addendum)
Your physician recommends that you have  lab work as soon as possible--BMET/BNP.  Decrease coreg (carvedilol) to 6.25mg  two times a day.   Take lasix (furosemide) as needed for weight gain of 2 pounds in 24 hours or 3 pounds in 1 week.   Your physician recommends that you schedule a follow-up appointment in: 2 months with Dr Aundra Dubin.

## 2014-06-10 ENCOUNTER — Other Ambulatory Visit (HOSPITAL_COMMUNITY): Payer: Self-pay | Admitting: Cardiology

## 2014-06-10 NOTE — Progress Notes (Signed)
Patient ID: Bastian Andreoli, male   DOB: 09-30-1930, 80 y.o.   MRN: [id number] PCP: Dr. Sallyanne Havers [facility])  80 yo with history of recurrent small bowel obstruction and CAD with ischemic cardiomyopathy presents for followup.  In 9/14, patient was hospitalized with recurrent abdominal pain from small bowel obstruction.  This was managed conservatively with NG tube/NPO and IV fluid.  In the hospital, the patient developed pulmonary edema and actually ended up being intubated.  Troponin was normal. Echo was done showing EF 35-40% with anteroseptal, anterolateral, and apical akinesis along with moderate AI.  He had no prior known cardiac disease.  It was thought that the patient had had an out of hospital MI at some point then developed pulmonary edema with IVF while NPO.  He was diuresed and extubated.  Creatinine was up to about 1.6 in the hospital so no cardiac cath was done (not thought to be urgent given normal creatinine).   I took him for [facility] in 11/14.  This showed 95% mid LAD stenosis that was treated with 2 overlapping Promus DES.  Echo in 2/15 showed EF 30-35% with normal RV.  Recently, Mr Marchi has felt like his BP is too low at times (SBP 90s-100s on occasion).  HR has ranged 40s-60s.  He brings HR and BP readings from home.  He feels tired and lethargic.  He has mild dyspnea when he mows his grass.  He does not get significant dyspnea walking on flat ground or up a flight of steps.  Weight is stable.  No chest pain.    Labs (9/14): K 3.4, creatinine 1.55 => 1.4, LDL 89, HDL 38 Labs (11/14): K 4.9, creatinine 1.6 Labs (1/15): K 4.1, creatinine 1.4 Labs (2/15): LDL 34, HDL 32  ECG: NSR, anterior T wave inversions  PMH: 1. Nephrolithiasis 2. H/o CCY 3. H/o diverticulitis 4. H/o SBO: initially in 1/14 with small bowel resection, again in 9/14 treated conservatively.  5. Ischemic cardiomyopathy: Echo (9/14) with EF 35-40%, anteroseptal/anterolateral/apical akinesis and moderate AI.  Patient had  acute pulmonary edema while hospitalized in 9/14. Echo (2/15) with EF 30-35% with wall motion abnormalities, normal RV size and systolic function, mild MR and mild AI.  6. CKD 7. Aortic insufficiency: mild on last echo 8. CAD: LHC (11/14) with 95% mLAD stenosis treated with 2 overlapping Promus DES.  There was residual 50% stenosis beyond the stent margin.    SH: Married (2nd time) with 4 children.  Lives in [place].  Previous heavy smoker.  Retired Chief Financial Officer.   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   ROS: All systems reviewed and negative except as per HPI.   Current Outpatient Prescriptions  Medication Sig Dispense Refill  . Ascorbic Acid (VITAMIN C PO) Take 1,000 mg by mouth daily.       Marland Kitchen aspirin EC 81 MG tablet Take 1 tablet (81 mg total) by mouth daily.  90 tablet  3  . atorvastatin (LIPITOR) 40 MG tablet TAKE 1 TABLET BY MOUTH DAILY AT 6 PM  30 tablet  0  . clopidogrel (PLAVIX) 75 MG tablet TAKE 1 TABLET BY MOUTH EVERY DAY WITH BREAKFAST  30 tablet  1  . Cyanocobalamin (VITAMIN B 12 PO) Take by mouth daily.      Marland Kitchen losartan (COZAAR) 25 MG tablet 1/2 tablet (total 12.5mg ) daily  30 tablet  3  . NIACIN PO Take 1 tablet by mouth daily.      . pantoprazole (PROTONIX) 40 MG tablet TAKE 1  TABLET BY MOUTH DAILY.  30 tablet  0  . spironolactone (ALDACTONE) 25 MG tablet 1/2 tablet (12.5mg ) daily  15 tablet  6  . Thiamine HCl (VITAMIN B-1 PO) Take 1 tablet by mouth daily.      Marland Kitchen VITAMIN E PO Take 1 tablet by mouth daily.      . carvedilol (COREG) 6.25 MG tablet Take 1 tablet (6.25 mg total) by mouth 2 (two) times daily.  180 tablet  3  . furosemide (LASIX) 20 MG tablet 1 tablet as needed       No current facility-administered medications for this visit.   Facility-Administered Medications Ordered in Other Visits  Medication Dose Route Frequency Provider Last Rate Last Dose  . midazolam (VERSED) 5 MG/5ML injection    Anesthesia Intra-op Sherry Ruffing, CRNA   2 mg at 08/09/13  1146  . propofol (DIPRIVAN) 10 mg/mL bolus/IV push    Anesthesia Intra-op Sherry Ruffing, CRNA   20 mg at 08/09/13 1147    BP 127/72  Pulse 51  Ht 5\' 9"  (1.753 m)  Wt 150 lb 9.6 oz (68.312 kg)  BMI 22.23 kg/m2 General: NAD Neck: JVP 7 cm, no thyromegaly or thyroid nodule.  Lungs: Clear bilaterally CV: Nondisplaced PMI.  Heart regular S1/S2, no S3/S4, 1/6 SEM RUSB.  No edema.  No carotid bruit.  Normal pedal pulses.  Abdomen: Soft, nontender, no hepatosplenomegaly, no distention.  Skin: Intact without lesions or rashes.  Neurologic: Alert and oriented x 3.  Psych: Normal affect. Extremities: No clubbing or cyanosis.   Assessment/Plan: 1. Chronic systolic CHF: Ischemic cardiomyopathy, EF 30-35%. He looks euvolemic today.  NYHA class II symptoms.  Main complaint is fatigue with low HR and at times, low BP.  - Continue current spironolactone and losartan.  - Cut back on Coreg to 6.25 mg bid.  Fatigue seemed to worsen after Coreg was increased at last visit.  - He can stop Lasix and use it as needed at this point (2 lb weight gain in 24 hours or 3 lbs in a week).   - BMET/BNP today. - Would avoid ICD given age.     2. CAD:  He likely had an out of hospital MI prior to last hospitalization in fall 2014.  Now s/p DES to mLAD with overlapping Promus stents.  Of note, there was residual 50% stenosis just beyond the stent margin.    - Continue ASA, Plavix and statin.  3. Hyperlipidemia: Continue atorvastatin, good lipids in 2/15.   3. CKD: Stable.   Loralie Champagne 06/10/2014

## 2014-06-11 ENCOUNTER — Other Ambulatory Visit (INDEPENDENT_AMBULATORY_CARE_PROVIDER_SITE_OTHER): Payer: Managed Care, Other (non HMO)

## 2014-06-11 DIAGNOSIS — I5022 Chronic systolic (congestive) heart failure: Secondary | ICD-10-CM

## 2014-06-11 DIAGNOSIS — I2510 Atherosclerotic heart disease of native coronary artery without angina pectoris: Secondary | ICD-10-CM

## 2014-06-11 LAB — BASIC METABOLIC PANEL
BUN: 19 mg/dL (ref 6–23)
CO2: 26 mEq/L (ref 19–32)
Calcium: 8.5 mg/dL (ref 8.4–10.5)
Chloride: 108 mEq/L (ref 96–112)
Creatinine, Ser: 1.5 mg/dL (ref 0.4–1.5)
GFR: 48.04 mL/min — ABNORMAL LOW (ref >60.00)
Glucose, Bld: 114 mg/dL — ABNORMAL HIGH (ref 70–99)
Potassium: 4.4 mEq/L (ref 3.5–5.1)
Sodium: 139 mEq/L (ref 135–145)

## 2014-06-11 LAB — BRAIN NATRIURETIC PEPTIDE: Pro B Natriuretic peptide (BNP): 179 pg/mL — ABNORMAL HIGH (ref 0.0–100.0)

## 2014-06-23 ENCOUNTER — Other Ambulatory Visit: Payer: Self-pay | Admitting: Cardiology

## 2014-07-14 ENCOUNTER — Other Ambulatory Visit (HOSPITAL_COMMUNITY): Payer: Self-pay | Admitting: Cardiology

## 2014-07-28 ENCOUNTER — Other Ambulatory Visit: Payer: Self-pay | Admitting: Cardiology

## 2014-08-13 ENCOUNTER — Other Ambulatory Visit (HOSPITAL_COMMUNITY): Payer: Self-pay | Admitting: Cardiology

## 2014-08-18 ENCOUNTER — Encounter: Payer: Self-pay | Admitting: Cardiology

## 2014-08-18 ENCOUNTER — Ambulatory Visit (INDEPENDENT_AMBULATORY_CARE_PROVIDER_SITE_OTHER): Payer: Managed Care, Other (non HMO) | Admitting: Cardiology

## 2014-08-18 VITALS — BP 116/60 | HR 55 | Ht 69.0 in | Wt 149.0 lb

## 2014-08-18 DIAGNOSIS — I2510 Atherosclerotic heart disease of native coronary artery without angina pectoris: Secondary | ICD-10-CM

## 2014-08-18 DIAGNOSIS — I509 Heart failure, unspecified: Secondary | ICD-10-CM

## 2014-08-18 DIAGNOSIS — N183 Chronic kidney disease, stage 3 unspecified: Secondary | ICD-10-CM

## 2014-08-18 DIAGNOSIS — I5022 Chronic systolic (congestive) heart failure: Secondary | ICD-10-CM

## 2014-08-18 NOTE — Patient Instructions (Signed)
Your physician recommends that you have  lab work today--BMET.  Your physician wants you to follow-up in: 4 months with Dr Aundra Dubin in the [facility] at [facility].

## 2014-08-19 LAB — BASIC METABOLIC PANEL
BUN: 18 mg/dL (ref 6–23)
CO2: 26 mEq/L (ref 19–32)
Calcium: 8.5 mg/dL (ref 8.4–10.5)
Chloride: 105 mEq/L (ref 96–112)
Creatinine, Ser: 1.6 mg/dL — ABNORMAL HIGH (ref 0.4–1.5)
GFR: 45.54 mL/min — ABNORMAL LOW (ref >60.00)
Glucose, Bld: 93 mg/dL (ref 70–99)
Potassium: 4 mEq/L (ref 3.5–5.1)
Sodium: 139 mEq/L (ref 135–145)

## 2014-08-19 NOTE — Progress Notes (Signed)
[name], male   DOB: 09/19/1930, 80 y.o.   MRN: [id number] PCP: Dr. Sallyanne Havers [facility])  80 yo with history of recurrent small bowel obstruction and CAD with ischemic cardiomyopathy presents for followup.  In 9/14, patient was hospitalized with recurrent abdominal pain from small bowel obstruction.  This was managed conservatively with NG tube/NPO and IV fluid.  In the hospital, the patient developed pulmonary edema and actually ended up being intubated.  Troponin was normal. Echo was done showing EF 35-40% with anteroseptal, anterolateral, and apical akinesis along with moderate AI.  He had no prior known cardiac disease.  It was thought that the patient had had an out of hospital MI at some point then developed pulmonary edema with IVF while NPO.  He was diuresed and extubated.  Creatinine was up to about 1.6 in the hospital so no cardiac cath was done (not thought to be urgent given normal creatinine).   I took him for [facility] in 11/14.  This showed 95% mid LAD stenosis that was treated with 2 overlapping Promus DES.  Echo in 2/15 showed EF 30-35% with normal RV.  At last visit, BP and HR were running low and [name] [name] was feeling fatigued.  I decreased Coreg to 6.25 mg bid and stopped Lasix. Since then, he is feeling better.  He uses Lasix once every 1-2 weeks.  He feels like his fatigue has improved.  He can mow the yard with no problem. No exertional dyspnea or chest pain.  He has been bruising a lot and wants to stop Plavix or ASA.  Labs (9/14): K 3.4, creatinine 1.55 => 1.4, LDL 89, HDL 38 Labs (11/14): K 4.9, creatinine 1.6 Labs (1/15): K 4.1, creatinine 1.4 Labs (2/15): LDL 34, HDL 32 Labs (7/15): K 4.4, creatinine 1.5, BNP 17  PMH: 1. Nephrolithiasis 2. H/o CCY 3. H/o diverticulitis 4. H/o SBO: initially in 1/14 with small bowel resection, again in 9/14 treated conservatively.  5. Ischemic cardiomyopathy: Echo (9/14) with EF 35-40%, anteroseptal/anterolateral/apical akinesis  and moderate AI.  Patient had acute pulmonary edema while hospitalized in 9/14. Echo (2/15) with EF 30-35% with wall motion abnormalities, normal RV size and systolic function, mild [name] and mild AI.  6. CKD 7. Aortic insufficiency: mild on last echo 8. CAD: LHC (11/14) with 95% mLAD stenosis treated with 2 overlapping Promus DES.  There was residual 50% stenosis beyond the stent margin.    SH: Married (2nd time) with 4 children.  Lives in [place].  Previous heavy smoker.  Retired Chief Financial Officer.   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   ROS: All systems reviewed and negative except as per HPI.   Current Outpatient Prescriptions  Medication Sig Dispense Refill  . Ascorbic Acid (VITAMIN C PO) Take 1,000 mg by mouth daily.       Marland Kitchen aspirin EC 81 MG tablet Take 1 tablet (81 mg total) by mouth daily.  90 tablet  3  . atorvastatin (LIPITOR) 40 MG tablet TAKE 1 TABLET BY MOUTH DAILY AT 6 PM  30 tablet  5  . carvedilol (COREG) 12.5 MG tablet Take 6.25 mg by mouth 2 (two) times daily with a meal.      . clopidogrel (PLAVIX) 75 MG tablet TAKE 1 TABLET BY MOUTH DAILY WITH BREAKFAST  30 tablet  3  . Coenzyme Q10 (COQ10) 200 MG CAPS Take 1 capsule by mouth daily.      . Cyanocobalamin (VITAMIN B 12 PO) Take by mouth daily.      Marland Kitchen  furosemide (LASIX) 20 MG tablet Take 1 tablet (20 mg total) by mouth as needed.  30 tablet  3  . Krill Oil 300 MG CAPS Take 1 capsule by mouth daily.      Marland Kitchen losartan (COZAAR) 25 MG tablet TAKE 1/2 TABLET DAILY.  30 tablet  5  . Magnesium 250 MG TABS Take 1 tablet by mouth daily.      Marland Kitchen NIACIN PO Take 1 tablet by mouth daily.      . pantoprazole (PROTONIX) 40 MG tablet TAKE 1 TABLET BY MOUTH DAILY AS NEEDED      . spironolactone (ALDACTONE) 25 MG tablet TAKE 1/2 TABLET BY MOUTH DAILY  15 tablet  0  . Thiamine HCl (VITAMIN B-1 PO) Take 1 tablet by mouth daily.      . vitamin A 8000 UNIT capsule Take 8,000 Units by mouth daily.      Marland Kitchen VITAMIN E PO Take 1 tablet by mouth  daily.       No current facility-administered medications for this visit.   Facility-Administered Medications Ordered in Other Visits  Medication Dose Route Frequency Provider Last Rate Last Dose  . midazolam (VERSED) 5 MG/5ML injection    Anesthesia Intra-op Sherry Ruffing, CRNA   2 mg at 08/09/13 1146  . propofol (DIPRIVAN) 10 mg/mL bolus/IV push    Anesthesia Intra-op Sherry Ruffing, CRNA   20 mg at 08/09/13 1147    BP 116/60  Pulse 55  Ht 5\' 9"  (1.753 m)  Wt 149 lb (67.586 kg)  BMI 21.99 kg/m2  SpO2 97% General: NAD Neck: JVP 7 cm, no thyromegaly or thyroid nodule.  Lungs: Clear bilaterally CV: Nondisplaced PMI.  Heart regular S1/S2, no S3/S4, 1/6 SEM RUSB.  No edema.  No carotid bruit.  Normal pedal pulses.  Abdomen: Soft, nontender, no hepatosplenomegaly, no distention.  Skin: Intact without lesions or rashes.  Neurologic: Alert and oriented x 3.  Psych: Normal affect. Extremities: No clubbing or cyanosis.   Assessment/Plan: 1. Chronic systolic CHF: Ischemic cardiomyopathy, EF 30-35%. He looks euvolemic today.  NYHA class II symptoms, less fatigue with lower Coreg.  - Continue current Coreg, spironolactone, and losartan.  - Continue to use Lasix prn.   - BMET today. - Would avoid ICD given age.     2. CAD:  He likely had an out of hospital MI prior to last hospitalization in fall 2014.  Now s/p DES to mLAD with overlapping Promus stents.  Of note, there was residual 50% stenosis just beyond the stent margin.    - Continue ASA, Plavix and statin. He wants to stop ASA or Plavix due to bruising.  I asked him to continue ASA 81 long-term.  I think that he can stop Plavix in 12/15.  3. Hyperlipidemia: Continue atorvastatin, good lipids in 2/15.   4. CKD: Stable.   Followup in 4 months in CHF clinic.   Loralie Champagne 08/19/2014

## 2014-08-31 ENCOUNTER — Other Ambulatory Visit: Payer: Self-pay | Admitting: Cardiology

## 2014-09-28 ENCOUNTER — Other Ambulatory Visit: Payer: Self-pay | Admitting: Cardiology

## 2014-10-27 ENCOUNTER — Other Ambulatory Visit: Payer: Self-pay | Admitting: Cardiology

## 2014-10-29 ENCOUNTER — Encounter (HOSPITAL_COMMUNITY): Payer: Self-pay | Admitting: Cardiology

## 2014-12-08 ENCOUNTER — Telehealth: Payer: Self-pay | Admitting: Cardiology

## 2014-12-08 NOTE — Telephone Encounter (Signed)
Discussed with patient

## 2014-12-08 NOTE — Telephone Encounter (Signed)
New message      Pt want to know why is Dr Aundra Dubin seeing him at the heart failure clinic instead of [address]?  He always sees him at [address]

## 2014-12-14 ENCOUNTER — Ambulatory Visit (HOSPITAL_COMMUNITY)
Admission: RE | Admit: 2014-12-14 | Discharge: 2014-12-14 | Disposition: A | Discharge status: Home / Self Care | Payer: Medicare HMO | Source: Ambulatory Visit | Attending: Cardiology | Admitting: Cardiology

## 2014-12-14 VITALS — BP 108/64 | HR 60 | Wt 145.0 lb

## 2014-12-14 DIAGNOSIS — N189 Chronic kidney disease, unspecified: Secondary | ICD-10-CM | POA: Insufficient documentation

## 2014-12-14 DIAGNOSIS — R0989 Other specified symptoms and signs involving the circulatory and respiratory systems: Secondary | ICD-10-CM | POA: Diagnosis not present

## 2014-12-14 DIAGNOSIS — Z79899 Other long term (current) drug therapy: Secondary | ICD-10-CM | POA: Insufficient documentation

## 2014-12-14 DIAGNOSIS — I2510 Atherosclerotic heart disease of native coronary artery without angina pectoris: Secondary | ICD-10-CM | POA: Insufficient documentation

## 2014-12-14 DIAGNOSIS — I5022 Chronic systolic (congestive) heart failure: Secondary | ICD-10-CM | POA: Diagnosis not present

## 2014-12-14 DIAGNOSIS — Z7982 Long term (current) use of aspirin: Secondary | ICD-10-CM | POA: Insufficient documentation

## 2014-12-14 DIAGNOSIS — E785 Hyperlipidemia, unspecified: Secondary | ICD-10-CM | POA: Diagnosis not present

## 2014-12-14 DIAGNOSIS — Z7902 Long term (current) use of antithrombotics/antiplatelets: Secondary | ICD-10-CM | POA: Diagnosis not present

## 2014-12-14 DIAGNOSIS — I252 Old myocardial infarction: Secondary | ICD-10-CM | POA: Diagnosis not present

## 2014-12-14 DIAGNOSIS — I255 Ischemic cardiomyopathy: Secondary | ICD-10-CM | POA: Insufficient documentation

## 2014-12-14 DIAGNOSIS — I2583 Coronary atherosclerosis due to lipid rich plaque: Secondary | ICD-10-CM

## 2014-12-14 NOTE — Progress Notes (Signed)
[name], male   DOB: 12/13/1929, 81 y.o.   MRN: [id number] PCP: Dr. Sallyanne Havers [facility])  81 yo male with history of recurrent small bowel obstruction and CAD with ischemic cardiomyopathy presents for followup.  In 9/14, patient was hospitalized with recurrent abdominal pain from small bowel obstruction.  This was managed conservatively with NG tube/NPO and IV fluid.  In the hospital, the patient developed pulmonary edema and actually ended up being intubated.  Troponin was normal. Echo was done showing EF 35-40% with anteroseptal, anterolateral, and apical akinesis along with moderate AI.  He had no prior known cardiac disease.  It was thought that the patient had had an out of hospital MI at some point then developed pulmonary edema with IVF while NPO.  He was diuresed and extubated.  Creatinine was up to about 1.6 in the hospital so no cardiac cath was done (not thought to be urgent given normal creatinine).   I took him for [facility] in 11/14.  This showed 95% mid LAD stenosis that was treated with 2 overlapping Promus DES.  Echo in 2/15 showed EF 30-35% with normal RV.  At last visit, Dr. Aundra Dubin cut back on carvedilol to 6.25 bid due to bradycardia and fatigue. Says he feels a little better but still doesn't have energy he thinks he should have. Brings in list of BPs and HR. SBP 96-136 (average about 115) HR 55-65. Remains active. No CP or dyspnea. Weight is stable. Only uses lasix prn for feet swelling. (once every 3-4 weeks)  Labs (9/14): K 3.4, creatinine 1.55 => 1.4, LDL 89, HDL 38 Labs (11/14): K 4.9, creatinine 1.6 Labs (1/15): K 4.1, creatinine 1.4 Labs (2/15): LDL 34, HDL 32  ECG: NSR, anterior T wave inversions  PMH: 1. Nephrolithiasis 2. H/o CCY 3. H/o diverticulitis 4. H/o SBO: initially in 1/14 with small bowel resection, again in 9/14 treated conservatively.  5. Ischemic cardiomyopathy: Echo (9/14) with EF 35-40%, anteroseptal/anterolateral/apical akinesis and moderate  AI.  Patient had acute pulmonary edema while hospitalized in 9/14. Echo (2/15) with EF 30-35% with wall motion abnormalities, normal RV size and systolic function, mild MR and mild AI.  6. CKD 7. Aortic insufficiency: mild on last echo 8. CAD: LHC (11/14) with 95% mLAD stenosis treated with 2 overlapping Promus DES.  There was residual 50% stenosis beyond the stent margin.    SH: Married (2nd time) with 4 children.  Lives in [place].  Previous heavy smoker.  Retired Chief Financial Officer.   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   ROS: All systems reviewed and negative except as per HPI.   Current Outpatient Prescriptions  Medication Sig Dispense Refill  . Ascorbic Acid (VITAMIN C PO) Take 1,000 mg by mouth daily.     Marland Kitchen aspirin EC 81 MG tablet Take 1 tablet (81 mg total) by mouth daily. 90 tablet 3  . atorvastatin (LIPITOR) 40 MG tablet TAKE 1 TABLET BY MOUTH DAILY AT 6 PM 30 tablet 5  . carvedilol (COREG) 12.5 MG tablet Take 6.25 mg by mouth 2 (two) times daily with a meal.    . carvedilol (COREG) 12.5 MG tablet Take 0.5 tablets (6.25 mg total) by mouth 2 (two) times daily. 30 tablet 3  . clopidogrel (PLAVIX) 75 MG tablet TAKE 1 TABLET BY MOUTH DAILY WITH BREAKFAST 30 tablet 3  . Coenzyme Q10 (COQ10) 200 MG CAPS Take 1 capsule by mouth daily.    . Cyanocobalamin (VITAMIN B 12 PO) Take by mouth daily.    Marland Kitchen  furosemide (LASIX) 20 MG tablet Take 1 tablet (20 mg total) by mouth as needed. 30 tablet 3  . Krill Oil 300 MG CAPS Take 1 capsule by mouth daily.    Marland Kitchen losartan (COZAAR) 25 MG tablet TAKE 1/2 TABLET DAILY. 30 tablet 5  . Magnesium 250 MG TABS Take 1 tablet by mouth daily.    Marland Kitchen NIACIN PO Take 1 tablet by mouth daily.    . pantoprazole (PROTONIX) 40 MG tablet TAKE 1 TABLET BY MOUTH DAILY 30 tablet 1  . spironolactone (ALDACTONE) 25 MG tablet TAKE 1/2 TABLET BY MOUTH EVERY DAY 15 tablet 11  . Thiamine HCl (VITAMIN B-1 PO) Take 1 tablet by mouth daily.    . vitamin A 8000 UNIT capsule Take  8,000 Units by mouth daily.    Marland Kitchen VITAMIN E PO Take 1 tablet by mouth daily.     No current facility-administered medications for this encounter.   Facility-Administered Medications Ordered in Other Encounters  Medication Dose Route Frequency Provider Last Rate Last Dose  . midazolam (VERSED) 5 MG/5ML injection    Anesthesia Intra-op Sherry Ruffing, CRNA   2 mg at 08/09/13 1146  . propofol (DIPRIVAN) 10 mg/mL bolus/IV push    Anesthesia Intra-op Sherry Ruffing, CRNA   20 mg at 08/09/13 1147    BP 108/64 mmHg  Pulse 60  Wt 145 lb (65.772 kg)  SpO2 97% General: NAD Neck: JVP 6 cm, no thyromegaly or thyroid nodule.  Lungs: Clear bilaterally CV: Nondisplaced PMI.  Heart regular S1/S2, no S3/S4, 1/6 SEM RUSB.  No edema.  Soft R carotid bruit.  Normal pedal pulses.  Abdomen: Soft, nontender, no hepatosplenomegaly, no distention.  Skin: Intact without lesions or rashes.  Neurologic: Alert and oriented x 3.  Psych: Normal affect. Extremities: No clubbing or cyanosis.   Assessment/Plan: 1. Chronic systolic CHF: Ischemic cardiomyopathy, EF 30-35%. He looks euvolemic today.  NYHA class II symptoms.  HR is improved with cutting back carvedilol to 6.25 bid - Continue current spironolactone, carvedilol and losartan.  - He can stop Lasix and use it as needed at this point (2 lb weight gain in 24 hours or 3 lbs in a week).   - Would avoid ICD given age.     2. CAD:  He likely had an out of hospital MI prior to last hospitalization in fall 2014.  Now s/p DES to mLAD with overlapping Promus stents.  Of note, there was residual 50% stenosis just beyond the stent margin.    - Continue ASA, Plavix and statin.  3. Hyperlipidemia: Continue atorvastatin, good lipids in 2/15.  Will have PCP recheck next month.  4. CKD: Stable. Will check BMET today. 5. R carotid bruit - will order carotid dopplers.   Glori Bickers MD 12/14/2014

## 2014-12-14 NOTE — Patient Instructions (Signed)
LABS TODAY  Your physician has requested that you have a carotid duplex. This test is an ultrasound of the carotid arteries in your neck. It looks at blood flow through these arteries that supply the brain with blood. Allow one hour for this exam. There are no restrictions or special instructions.   Your physician recommends that you schedule a follow-up appointment in: 4 MONTHS  Do the following things EVERYDAY: 1) Weigh yourself in the morning before breakfast. Write it down and keep it in a log. 2) Take your medicines as prescribed 3) Eat low salt foods-Limit salt (sodium) to 2000 mg per day.  4) Stay as active as you can everyday 5) Limit all fluids for the day to less than 2 liters 6)

## 2014-12-14 NOTE — Addendum Note (Signed)
Encounter addended by: Kerry Dory, CMA on: 12/14/2014  4:19 PM<BR>     Documentation filed: Visit Diagnoses, Dx Association, Patient Instructions Section, Orders

## 2014-12-16 ENCOUNTER — Other Ambulatory Visit (HOSPITAL_COMMUNITY): Payer: Self-pay

## 2014-12-16 ENCOUNTER — Telehealth: Payer: Self-pay | Admitting: Cardiology

## 2014-12-16 DIAGNOSIS — R0989 Other specified symptoms and signs involving the circulatory and respiratory systems: Secondary | ICD-10-CM

## 2014-12-16 NOTE — Telephone Encounter (Signed)
She thinks her father prefers to be seen in the [facility]. I advised her follow up with Dr Aundra Dubin could be scheduled at either location. She thanked me and said she would discuss with her father.

## 2014-12-16 NOTE — Telephone Encounter (Addendum)
New message     Pt request to talk to [facility].  She would not tell me what she wanted

## 2014-12-16 NOTE — Telephone Encounter (Signed)
Discussed [facility] appt 12/14/14 with daughter, pt was scheduled with Dr Haroldine Laws and not Dr Aundra Dubin. Advised daughter I felt that was a scheduling error and any follow up can be scheduled with Dr Aundra Dubin.

## 2014-12-19 ENCOUNTER — Other Ambulatory Visit: Payer: Self-pay | Admitting: Cardiology

## 2014-12-24 ENCOUNTER — Other Ambulatory Visit (HOSPITAL_COMMUNITY): Payer: Self-pay

## 2014-12-24 ENCOUNTER — Ambulatory Visit (HOSPITAL_COMMUNITY): Payer: Medicare HMO | Attending: Cardiology | Admitting: Cardiology

## 2014-12-24 ENCOUNTER — Other Ambulatory Visit: Payer: Self-pay | Admitting: *Deleted

## 2014-12-24 ENCOUNTER — Ambulatory Visit (HOSPITAL_COMMUNITY): Payer: Managed Care, Other (non HMO)

## 2014-12-24 DIAGNOSIS — R0989 Other specified symptoms and signs involving the circulatory and respiratory systems: Secondary | ICD-10-CM | POA: Diagnosis not present

## 2014-12-24 DIAGNOSIS — I6521 Occlusion and stenosis of right carotid artery: Secondary | ICD-10-CM

## 2014-12-24 DIAGNOSIS — I6523 Occlusion and stenosis of bilateral carotid arteries: Secondary | ICD-10-CM | POA: Diagnosis present

## 2014-12-24 MED ORDER — ATORVASTATIN CALCIUM 40 MG PO TABS: ORAL_TABLET | ORAL | Status: DC

## 2014-12-24 NOTE — Progress Notes (Signed)
Carotid duplex performed 

## 2014-12-25 ENCOUNTER — Other Ambulatory Visit: Payer: Self-pay | Admitting: *Deleted

## 2014-12-25 ENCOUNTER — Encounter: Payer: Self-pay | Admitting: Surgery

## 2014-12-25 ENCOUNTER — Other Ambulatory Visit (HOSPITAL_COMMUNITY): Payer: Medicare HMO

## 2014-12-25 ENCOUNTER — Encounter: Payer: Medicare HMO | Admitting: Vascular Surgery

## 2014-12-25 DIAGNOSIS — I6521 Occlusion and stenosis of right carotid artery: Secondary | ICD-10-CM

## 2014-12-28 ENCOUNTER — Encounter: Payer: Self-pay | Admitting: Surgery

## 2014-12-28 ENCOUNTER — Ambulatory Visit (HOSPITAL_COMMUNITY)
Admission: RE | Admit: 2014-12-28 | Discharge: 2014-12-28 | Disposition: A | Discharge status: Home / Self Care | Payer: Medicare HMO | Source: Ambulatory Visit | Attending: Surgery | Admitting: Surgery

## 2014-12-28 ENCOUNTER — Ambulatory Visit (INDEPENDENT_AMBULATORY_CARE_PROVIDER_SITE_OTHER): Payer: Medicare HMO | Admitting: Surgery

## 2014-12-28 ENCOUNTER — Other Ambulatory Visit (HOSPITAL_COMMUNITY): Payer: Medicare HMO

## 2014-12-28 ENCOUNTER — Other Ambulatory Visit: Payer: Self-pay | Admitting: *Deleted

## 2014-12-28 ENCOUNTER — Encounter: Payer: Medicare HMO | Admitting: Surgery

## 2014-12-28 VITALS — BP 122/71 | HR 61 | Ht 69.0 in | Wt 143.0 lb

## 2014-12-28 DIAGNOSIS — Z01810 Encounter for preprocedural cardiovascular examination: Secondary | ICD-10-CM

## 2014-12-28 DIAGNOSIS — I6523 Occlusion and stenosis of bilateral carotid arteries: Secondary | ICD-10-CM

## 2014-12-28 DIAGNOSIS — I6521 Occlusion and stenosis of right carotid artery: Secondary | ICD-10-CM | POA: Diagnosis not present

## 2014-12-28 NOTE — Progress Notes (Signed)
HISTORY AND PHYSICAL     CC:  Carotid artery  Referring Provider:  Drake Leach, MD  HPI: This is a 81 y.o. male who states he is here today b/c Dr. Tempie Hoist heard a swish in the right side of his neck, which lead to an ultrasound.  This revealed > 80% critical stenosis and the pt was referred to [facility].  He states he has not had any symptoms and denies any temporary blindness, hemiparesis, or trouble speaking.    In September 2014, he was hospitalized with recurrent abdominal pain from SBO.  It was managed conservatively with NGT and IVF.  He developed pulmonary edema and and was intubated.  Echo revealed 35-40%.  He was diuresed and extubated.  At that time, his creatinine was slightly elevated at 1.6 and was taken to the cath lab at a later date (Novemeber 2014) and was found to have a mid LAD stenosis and had a DES placed.  He is on Plavix for this.    He states that he does have some pain in his feet at rest.  He tells me that he had a herniated disk in the past and has had trouble with numbness and tingling in his legs.  He states that after he underwent a cholecystectomy, he did not have any back pain after that.    He is referred to [facility] for his abnormal carotid duplex study.  He is currently on aspirin, Plavix and statin as well as a beta blocker and ARB for his heart disease.    Past Medical History  Diagnosis Date  . Small bowel perforation     a. 11/2012 s/p emergent SB resection.  . Diverticulitis   . CKD (chronic kidney disease), stage III   . Coronary artery disease     a. 09/2013 Cath: LM min irregs, LAD 95p(2.75x20 Promus Premier DES & 3.0x20 Promus Premier DES overlapping), D1 50-60, LCX 30p, OM1 12m, OM2 60-70p, RCA small, nl.  . Ischemic cardiomyopathy     a. 07/2013 Echo: EF 35-40%, apical septal, apical lat, apical AK, mod MR.  . Chronic systolic CHF (congestive heart failure)     a. 07/2013 Echo: EF 35-40%  . Hyperlipidemia     Past Surgical History  Procedure  Laterality Date  . Laparoscopic cholecystectomy    . Inguinal hernia repair    . Cardiac catheterization  10/09/2013  . Coronary angioplasty  10/09/2013    MID LAD  . Colon surgery  11/2012    BOWEL SURGERY   . Left heart catheterization with coronary angiogram N/A 10/09/2013    Procedure: LEFT HEART CATHETERIZATION WITH CORONARY ANGIOGRAM;  Surgeon: Larey Dresser, MD;  Location: [facility] CATH LAB;  Service: Cardiovascular;  Laterality: N/A;  . Percutaneous coronary stent intervention (pci-s)  10/09/2013    Procedure: PERCUTANEOUS CORONARY STENT INTERVENTION (PCI-S);  Surgeon: Larey Dresser, MD;  Location: [facility] CATH LAB;  Service: Cardiovascular;;    No Known Allergies  Current Outpatient Prescriptions  Medication Sig Dispense Refill  . aspirin EC 81 MG tablet Take 1 tablet (81 mg total) by mouth daily. 90 tablet 3  . carvedilol (COREG) 12.5 MG tablet Take 6.25 mg by mouth 2 (two) times daily with a meal.    . Ascorbic Acid (VITAMIN C PO) Take 1,000 mg by mouth daily.     Marland Kitchen atorvastatin (LIPITOR) 40 MG tablet TAKE 1 TABLET BY MOUTH DAILY AT 6 PM 30 tablet 5  . carvedilol (COREG) 12.5 MG tablet Take 0.5  tablets (6.25 mg total) by mouth 2 (two) times daily. 30 tablet 3  . clopidogrel (PLAVIX) 75 MG tablet TAKE 1 TABLET BY MOUTH DAILY WITH BREAKFAST 30 tablet 6  . Coenzyme Q10 (COQ10) 200 MG CAPS Take 1 capsule by mouth daily.    . Cyanocobalamin (VITAMIN B 12 PO) Take by mouth daily.    . furosemide (LASIX) 20 MG tablet Take 1 tablet (20 mg total) by mouth as needed. 30 tablet 3  . Krill Oil 300 MG CAPS Take 1 capsule by mouth daily.    Marland Kitchen losartan (COZAAR) 25 MG tablet TAKE 1/2 TABLET DAILY. 30 tablet 5  . Magnesium 250 MG TABS Take 1 tablet by mouth daily.    Marland Kitchen NIACIN PO Take 1 tablet by mouth daily.    . pantoprazole (PROTONIX) 40 MG tablet TAKE 1 TABLET BY MOUTH DAILY 30 tablet 1  . spironolactone (ALDACTONE) 25 MG tablet TAKE 1/2 TABLET BY MOUTH EVERY DAY 15 tablet 11  . Thiamine HCl  (VITAMIN B-1 PO) Take 1 tablet by mouth daily.    . vitamin A 8000 UNIT capsule Take 8,000 Units by mouth daily.    Marland Kitchen VITAMIN E PO Take 1 tablet by mouth daily.     No current facility-administered medications for this visit.   Facility-Administered Medications Ordered in Other Visits  Medication Dose Route Frequency Provider Last Rate Last Dose  . midazolam (VERSED) 5 MG/5ML injection    Anesthesia Intra-op Sherry Ruffing, CRNA   2 mg at 08/09/13 1146  . propofol (DIPRIVAN) 10 mg/mL bolus/IV push    Anesthesia Intra-op Sherry Ruffing, CRNA   20 mg at 08/09/13 1147    Family History  Problem Relation Age of Onset  . Emphysema Father     died @ 85  . Diabetes Mother     died @ 57    History   Social History  . Marital Status: Married    Spouse Name: N/A    Number of Children: N/A  . Years of Education: N/A   Occupational History  . Not on file.   Social History Main Topics  . Smoking status: Former Smoker -- 1.00 packs/day for 45 years    Types: Cigarettes    Quit date: 06/09/1999  . Smokeless tobacco: Never Used  . Alcohol Use: No  . Drug Use: No  . Sexual Activity: Not Currently   Other Topics Concern  . Not on file   Social History Narrative   Pt lives in [place] with his wife of 14 yrs (second wife).  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  Very active @ home.  Still push mows his yard.     ROS: [x]  Positive   [ ]  Negative   [ ]  All sytems reviewed and are negative  Cardiovascular: []  chest pain/pressure []  palpitations []  SOB lying flat []  DOE []  pain in legs while walking [x]  pain in feet when lying flat []  hx of DVT []  hx of phlebitis []  swelling in legs []  varicose veins  Pulmonary: []  productive cough []  asthma []  wheezing  Neurologic: []  weakness in []  arms []  legs []  numbness in []  arms []  legs [] difficulty speaking or slurred speech []  temporary loss of vision in one eye []   dizziness  Hematologic: []  bleeding problems []  problems with blood clotting easily  GI []  vomiting blood []  blood in stool  GU: []  burning with urination []  blood in urine  Psychiatric: []  hx of major depression  Musculoskeletal: [x]  hx herniated disk  Integumentary: []  rashes []  ulcers  Constitutional: []  fever []  chills   PHYSICAL EXAMINATION:  Filed Vitals:   12/28/14 1516  BP: 122/71  Pulse:    Body mass index is 21.11 kg/(m^2).  General:  WDWN in NAD Gait: Not observed HENT: WNL, normocephalic Pulmonary: normal non-labored breathing , without Rales, rhonchi,  wheezing Cardiac: RRR, without  Murmurs, rubs or gallops; without carotid bruits Skin: without rashes, without ulcers  Vascular Exam/Pulses:  Right Left  Radial 2+ (normal) 2+ (normal)  Ulnar 1+ (weak) Unable to palpate   Popliteal Unable to palpate  Unable to palpate   DP Unable to palpate  Unable to palpate   PT Unable to palpate  Unable to palpate    Extremities: without ischemic changes, without Gangrene , without cellulitis; without open wounds;  Musculoskeletal: no muscle wasting or atrophy  Neurologic: A&O X 3; Appropriate Affect ; SENSATION: normal; MOTOR FUNCTION:  moving all extremities equally. Speech is fluent/normal   Non-Invasive Vascular Imaging:    Carotid duplex Jeneen Rinks 12/28/14: Right ICA velocities suggest a 60-79% stenosis (high end range)  Pt meds includes: Statin:  Yes.   Beta Blocker:  Yes.   Aspirin:  Yes.   ACEI:  No. ARB:  Yes.   Other Antiplatelet/Anticoagulant:  Yes.   Plavix   ASSESSMENT/PLAN:: 81 y.o. male with asymtomatic bilateral carotid artery stenosis with right > left   -pt was referred to [facility] for abnormal carotid duplex, which revealed 80-99% stenosis of the right ICA and 60-79% left ICA stenosis.  -pt had the carotid duplex repeated at our office today-this revealed 60-79% stenosis of the right ICA.  Given the differences in the scans,  we will obtain a CTA of his carotids to better determine the degree of stenosis.  -after the CTA, the pt will follow up with Dr. Trula Slade to discuss the results and determine the next step -pt and his family understand that if has sx, he is to go to the ED   Leontine Locket, PA-C [facility] [phone]  Clinic MD:  Pt seen and examined in conjunction with Dr. Trula Slade  I agree with the above.  I have seen and evaluated the patient.  He has asymptomatic left carotid stenosis.  Outside ultrasound identified greater than 80% stenosis.  We could not repeat the velocities in our office.  Here we measured 60-79 percent left carotid stenosis.  I have discussed this with the patient.  I feel a confirmatory test is necessary to determine the degree of stenosis.  In order to avoid an invasive procedure I'm sending him for CT angiogram of the neck and he will follow-up with me once that has been done.

## 2014-12-30 ENCOUNTER — Other Ambulatory Visit: Payer: Self-pay | Admitting: *Deleted

## 2014-12-30 DIAGNOSIS — Z01810 Encounter for preprocedural cardiovascular examination: Secondary | ICD-10-CM

## 2014-12-30 DIAGNOSIS — I6523 Occlusion and stenosis of bilateral carotid arteries: Secondary | ICD-10-CM

## 2015-01-06 ENCOUNTER — Telehealth: Payer: Self-pay | Admitting: *Deleted

## 2015-01-06 NOTE — Telephone Encounter (Signed)
[name] [name]'s PCP, Dr. Sallyanne Havers, called me today reporting that patient's bloodwork has shown that he has a leukocytosis issue that needs to be addressed prior to patient's carotid workup. Dr. Sallyanne Havers is informing the patient today of his findings and they will be sending him to hematology/ oncology for further evaluation. [name] [name] CTA of the neck has been tentatively rescheduled for 02-05-15 at [facility]. I sent a staff message to Dr. Trula Slade.

## 2015-01-11 ENCOUNTER — Ambulatory Visit: Payer: Medicare HMO | Admitting: Surgery

## 2015-02-05 ENCOUNTER — Encounter: Payer: Self-pay | Admitting: Surgery

## 2015-02-08 ENCOUNTER — Encounter: Payer: Self-pay | Admitting: Surgery

## 2015-02-08 ENCOUNTER — Telehealth (HOSPITAL_COMMUNITY): Payer: Self-pay | Admitting: Vascular Surgery

## 2015-02-08 ENCOUNTER — Other Ambulatory Visit: Payer: Self-pay | Admitting: Cardiology

## 2015-02-08 ENCOUNTER — Ambulatory Visit (INDEPENDENT_AMBULATORY_CARE_PROVIDER_SITE_OTHER): Payer: Medicare HMO | Admitting: Surgery

## 2015-02-08 VITALS — BP 127/54 | HR 61 | Ht 69.0 in | Wt 147.0 lb

## 2015-02-08 DIAGNOSIS — I6523 Occlusion and stenosis of bilateral carotid arteries: Secondary | ICD-10-CM | POA: Diagnosis not present

## 2015-02-08 NOTE — Telephone Encounter (Signed)
[facility] called needs cardiac clearance to get Vascular surgery .Marland Kitchen Please advise

## 2015-02-08 NOTE — Progress Notes (Signed)
Patient name: [name] [name] MRN: [id number] DOB: [birth year] Sex: male     Chief Complaint  Patient presents with  . Re-evaluation    1-2 wk f/u CTA neck - [facility]     HISTORY OF PRESENT ILLNESS: The patient is here today for follow-up of his carotid occlusive disease.  This was recently detected when a bruit was auscultated on exam.  His initial ultrasound showed greater than 80% right carotid stenosis.  That study is repeated here at [facility], and the stenosis was not as pronounced.  Therefore, I sent him for a confirmatory CT angiogram.  He denies having any interval symptoms.  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  He has not had any side effects from this medication.  The patient has a history of coronary artery disease.  His most recent intervention was DES treatment of his LAD in November 2014.  He has ischemic cardiomyopathy with an ejection fraction in the 30-40 percent range.  He is on dual antiplatelet therapy with aspirin and Plavix.  His hyperlipidemia is managed with a statin.  His blood pressure is well controlled with an ARB and beta blocker.  Past Medical History  Diagnosis Date  . Small bowel perforation     a. 11/2012 s/p emergent SB resection.  . Diverticulitis   . CKD (chronic kidney disease), stage III   . Coronary artery disease     a. 09/2013 Cath: LM min irregs, LAD 95p(2.75x20 Promus Premier DES & 3.0x20 Promus Premier DES overlapping), D1 50-60, LCX 30p, OM1 43m, OM2 60-70p, RCA small, nl.  . Ischemic cardiomyopathy     a. 07/2013 Echo: EF 35-40%, apical septal, apical lat, apical AK, mod MR.  . Chronic systolic CHF (congestive heart failure)     a. 07/2013 Echo: EF 35-40%  . Hyperlipidemia     Past Surgical History  Procedure Laterality Date  . Laparoscopic cholecystectomy    . Inguinal hernia repair    . Cardiac catheterization  10/09/2013  . Coronary angioplasty  10/09/2013    MID LAD  . Colon surgery   11/2012    BOWEL SURGERY   . Left heart catheterization with coronary angiogram N/A 10/09/2013    Procedure: LEFT HEART CATHETERIZATION WITH CORONARY ANGIOGRAM;  Surgeon: Larey Dresser, MD;  Location: [facility] CATH LAB;  Service: Cardiovascular;  Laterality: N/A;  . Percutaneous coronary stent intervention (pci-s)  10/09/2013    Procedure: PERCUTANEOUS CORONARY STENT INTERVENTION (PCI-S);  Surgeon: Larey Dresser, MD;  Location: [facility] CATH LAB;  Service: Cardiovascular;;    History   Social History  . Marital Status: Married    Spouse Name: N/A  . Number of Children: N/A  . Years of Education: N/A   Occupational History  . Not on file.   Social History Main Topics  . Smoking status: Former Smoker -- 1.00 packs/day for 45 years    Types: Cigarettes    Quit date: 06/09/1999  . Smokeless tobacco: Never Used  . Alcohol Use: No  . Drug Use: No  . Sexual Activity: Not Currently   Other Topics Concern  . Not on file   Social History Narrative   Pt lives in [place] with his wife of 14 yrs (second wife).  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  Very active @ home.  Still push mows his yard.    Family History  Problem Relation Age of Onset  .  Emphysema Father     died @ 17  . Diabetes Mother     died @ 57    Allergies as of February 17, 2015  . (No Known Allergies)    Current Outpatient Prescriptions on File Prior to Visit  Medication Sig Dispense Refill  . Ascorbic Acid (VITAMIN C PO) Take 1,000 mg by mouth daily.     Marland Kitchen aspirin EC 81 MG tablet Take 1 tablet (81 mg total) by mouth daily. 90 tablet 3  . atorvastatin (LIPITOR) 40 MG tablet TAKE 1 TABLET BY MOUTH DAILY AT 6 PM 30 tablet 5  . carvedilol (COREG) 12.5 MG tablet Take 6.25 mg by mouth 2 (two) times daily with a meal.    . clopidogrel (PLAVIX) 75 MG tablet TAKE 1 TABLET BY MOUTH DAILY WITH BREAKFAST 30 tablet 6  . Coenzyme Q10 (COQ10) 200 MG CAPS Take 1 capsule by mouth daily.    .  Cyanocobalamin (VITAMIN B 12 PO) Take by mouth daily.    . furosemide (LASIX) 20 MG tablet Take 1 tablet (20 mg total) by mouth as needed. 30 tablet 3  . Krill Oil 300 MG CAPS Take 1 capsule by mouth daily.    Marland Kitchen losartan (COZAAR) 25 MG tablet TAKE 1/2 TABLET DAILY. 30 tablet 5  . Magnesium 250 MG TABS Take 1 tablet by mouth daily.    Marland Kitchen NIACIN PO Take 1 tablet by mouth daily.    . pantoprazole (PROTONIX) 40 MG tablet TAKE 1 TABLET BY MOUTH DAILY 30 tablet 1  . spironolactone (ALDACTONE) 25 MG tablet TAKE 1/2 TABLET BY MOUTH EVERY DAY 15 tablet 11  . Thiamine HCl (VITAMIN B-1 PO) Take 1 tablet by mouth daily.    . vitamin A 8000 UNIT capsule Take 8,000 Units by mouth daily.    Marland Kitchen VITAMIN E PO Take 1 tablet by mouth daily.     Current Facility-Administered Medications on File Prior to Visit  Medication Dose Route Frequency Provider Last Rate Last Dose  . midazolam (VERSED) 5 MG/5ML injection    Anesthesia Intra-op Janine Ores, CRNA   2 mg at 08/09/13 1146  . propofol (DIPRIVAN) 10 mg/mL bolus/IV push    Anesthesia Intra-op Janine Ores, CRNA   20 mg at 08/09/13 1147     REVIEW OF SYSTEMS: Please see history of present illness, otherwise no changes since last visit   PHYSICAL EXAMINATION:   Vital signs are  Filed Vitals:   02-17-2015 1430 02/17/15 1432  BP: 118/6 127/54  Pulse: 61   Height: 5\' 9"  (1.753 m)   Weight: 147 lb (66.679 kg)   SpO2: 97%    Body mass index is 21.7 kg/(m^2). General: The patient appears their stated age. HEENT:  No gross abnormalities Pulmonary:  Non labored breathing Musculoskeletal: There are no major deformities. Neurologic: No focal weakness or paresthesias are detected, Skin: There are no ulcer or rashes noted. Psychiatric: The patient has normal affect. Cardiovascular: There is a regular rate and rhythm positive bruit   Diagnostic Studies Outside CT scan shows 90% right carotid stenosis at its origin.  The left is in the 50-75%  range  Assessment: Asymptomatic right carotid stenosis Plan: I spent greater than 45 minutes reviewing the patient's information, greater than 50% of this was face-to-face contact.  We presented data regarding treatment of a symptomatically carotid stenosis.  We will proceed with right carotid endarterectomy once he has been cleared from his oncologist as well as his cardiologist.  We discussed the risks of  operation including the risk of stroke nerve injury and bleeding.  I would like for him to be off his Plavix if possible, however I will do his operation on Plavix.  With his new diagnosis of CML and the initiation of Glivac, I want to give formal clearance from Dr. [name] before proceeding.  We will contact the patient to schedule his operation once he has been cleared medically.  Eldridge Abrahams, M.D. [facility] [place] Office: [phone] Pager:  [phone]

## 2015-02-10 NOTE — Telephone Encounter (Signed)
Dr. Trula Slade plans to do right carotid endarterectomy for stenosis >50%.  Willing to do while patient on plavix if necessary.  Needs clearance from our office.  Will forward to MD for review.  Renee Pain

## 2015-05-07 ENCOUNTER — Telehealth: Payer: Self-pay | Admitting: Cardiology

## 2015-05-07 NOTE — Progress Notes (Signed)
This nurse called [name] [name] to inquire about scheduling right carotid endarterectomy with Dr. Trula Slade. Patient states, "I am not going to have it right now. I like the doctor and everything. I will call if I change my mind." This nurse instructed patient to call if he had any further questions or concerns. Patient verbalized understanding.

## 2015-05-07 NOTE — Telephone Encounter (Signed)
OK to hold Plavix for surgery 

## 2015-05-07 NOTE — Telephone Encounter (Signed)
Request for surgical clearance:  1. What type of surgery is being performed? Carotid surgery   2. When is this surgery scheduled? Not yet scheduled   3. Are there any medications that need to be held prior to surgery and how long? Requesting Plavix held 5 days prior to surgery   4. Name of physician performing surgery? Dr. Trula Slade   5. What is your office phone and fax number?     [facility] Phone: [phone] Fax: [phone]

## 2015-05-20 ENCOUNTER — Other Ambulatory Visit: Payer: Self-pay | Admitting: Cardiology

## 2015-05-25 ENCOUNTER — Other Ambulatory Visit: Payer: Self-pay | Admitting: Cardiology

## 2015-06-17 ENCOUNTER — Telehealth: Payer: Self-pay | Admitting: Cardiology

## 2015-06-17 NOTE — Telephone Encounter (Signed)
Pt c/o swelling: STAT is pt has developed SOB within 24 hours  1. How long have you been experiencing swelling? Couple of months  2. Where is the swelling located? Eyes and feet  3.  Are you currently taking a "fluid pill"? Yes  4.  Are you currently SOB? No  5.  Have you traveled recently? No  Pt's daughter calling stating that he has been feeling really tired. Please call back and advise.

## 2015-06-17 NOTE — Telephone Encounter (Signed)
Patient's daughter calling about her father. She is worried about his swelling in his BLE and feeling tired all the time. Daughter states patient does not have any chest pain or SOB and his swelling goes down after he takes his lasix.  She is concerned that this might be his heart or Gleevec, the medication he is on for Leukemia. Daughter is really concerned and worried because her dad is trying to take a road trip from here to [place] soon. Daughter wants patient to be seen by Dr. Aundra Dubin. Patient has the earliest appointment which is in October. Daughter does not think he can wait that long and she is refusing to see a PA or NP. Informed patient's daughter that I would forward her message to Dr. Aundra Dubin and his nurse Webb Silversmith. Patient does not know his daughter is calling at this time.

## 2015-06-17 NOTE — Telephone Encounter (Signed)
I will arrange to see him next week on a day I am in CHF clinic.  Please set him up to see me then [facility].

## 2015-06-19 ENCOUNTER — Other Ambulatory Visit: Payer: Self-pay | Admitting: Cardiology

## 2015-06-21 ENCOUNTER — Ambulatory Visit (HOSPITAL_COMMUNITY)
Admission: RE | Admit: 2015-06-21 | Discharge: 2015-06-21 | Disposition: A | Discharge status: Home / Self Care | Payer: Medicare HMO | Source: Ambulatory Visit | Attending: Cardiology | Admitting: Cardiology

## 2015-06-21 ENCOUNTER — Encounter (HOSPITAL_COMMUNITY): Payer: Self-pay

## 2015-06-21 VITALS — BP 132/70 | HR 54 | Wt 146.5 lb

## 2015-06-21 DIAGNOSIS — Z7982 Long term (current) use of aspirin: Secondary | ICD-10-CM | POA: Diagnosis not present

## 2015-06-21 DIAGNOSIS — I2510 Atherosclerotic heart disease of native coronary artery without angina pectoris: Secondary | ICD-10-CM | POA: Diagnosis not present

## 2015-06-21 DIAGNOSIS — I5022 Chronic systolic (congestive) heart failure: Secondary | ICD-10-CM | POA: Insufficient documentation

## 2015-06-21 DIAGNOSIS — C9210 Chronic myeloid leukemia, BCR/ABL-positive, not having achieved remission: Secondary | ICD-10-CM | POA: Diagnosis not present

## 2015-06-21 DIAGNOSIS — R0989 Other specified symptoms and signs involving the circulatory and respiratory systems: Secondary | ICD-10-CM | POA: Diagnosis not present

## 2015-06-21 DIAGNOSIS — I252 Old myocardial infarction: Secondary | ICD-10-CM | POA: Diagnosis not present

## 2015-06-21 DIAGNOSIS — I255 Ischemic cardiomyopathy: Secondary | ICD-10-CM | POA: Insufficient documentation

## 2015-06-21 DIAGNOSIS — Z79899 Other long term (current) drug therapy: Secondary | ICD-10-CM | POA: Diagnosis not present

## 2015-06-21 DIAGNOSIS — I6523 Occlusion and stenosis of bilateral carotid arteries: Secondary | ICD-10-CM | POA: Diagnosis not present

## 2015-06-21 DIAGNOSIS — Z87891 Personal history of nicotine dependence: Secondary | ICD-10-CM | POA: Diagnosis not present

## 2015-06-21 DIAGNOSIS — N183 Chronic kidney disease, stage 3 unspecified: Secondary | ICD-10-CM

## 2015-06-21 DIAGNOSIS — R600 Localized edema: Secondary | ICD-10-CM | POA: Insufficient documentation

## 2015-06-21 DIAGNOSIS — N189 Chronic kidney disease, unspecified: Secondary | ICD-10-CM | POA: Diagnosis not present

## 2015-06-21 DIAGNOSIS — E785 Hyperlipidemia, unspecified: Secondary | ICD-10-CM | POA: Diagnosis not present

## 2015-06-21 LAB — LIPID PANEL
Cholesterol: 74 mg/dL (ref 0–200)
HDL: 33 mg/dL — ABNORMAL LOW (ref >40)
LDL Cholesterol: 26 mg/dL (ref 0–99)
Total CHOL/HDL Ratio: 2.2 RATIO
Triglycerides: 75 mg/dL (ref <150)
VLDL: 15 mg/dL (ref 0–40)

## 2015-06-21 LAB — CBC
HCT: 37.3 % — ABNORMAL LOW (ref 39.0–52.0)
Hemoglobin: 12.2 g/dL — ABNORMAL LOW (ref 13.0–17.0)
MCH: 31.7 pg (ref 26.0–34.0)
MCHC: 32.7 g/dL (ref 30.0–36.0)
MCV: 96.9 fL (ref 78.0–100.0)
Platelets: 130 10*3/uL — ABNORMAL LOW (ref 150–400)
RBC: 3.85 MIL/uL — ABNORMAL LOW (ref 4.22–5.81)
RDW: 15.8 % — ABNORMAL HIGH (ref 11.5–15.5)
WBC: 5.5 10*3/uL (ref 4.0–10.5)

## 2015-06-21 LAB — BASIC METABOLIC PANEL
Anion gap: 8 (ref 5–15)
BUN: 20 mg/dL (ref 6–20)
CO2: 26 mmol/L (ref 22–32)
Calcium: 8.6 mg/dL — ABNORMAL LOW (ref 8.9–10.3)
Chloride: 102 mmol/L (ref 101–111)
Creatinine, Ser: 1.87 mg/dL — ABNORMAL HIGH (ref 0.61–1.24)
GFR calc Af Amer: 37 mL/min — ABNORMAL LOW (ref >60)
GFR calc non Af Amer: 32 mL/min — ABNORMAL LOW (ref >60)
Glucose, Bld: 99 mg/dL (ref 65–99)
Potassium: 4.2 mmol/L (ref 3.5–5.1)
Sodium: 136 mmol/L (ref 135–145)

## 2015-06-21 MED ORDER — CARVEDILOL 12.5 MG PO TABS: 6.2500 mg | ORAL_TABLET | Freq: Two times a day (BID) | ORAL | Status: DC

## 2015-06-21 MED ORDER — FUROSEMIDE 20 MG PO TABS: 20.0000 mg | ORAL_TABLET | Freq: Every day | ORAL | Status: DC

## 2015-06-21 NOTE — Telephone Encounter (Signed)
Spoke with patient to give him a add on appointment for today 06/21/2015 @ 13p Pt reports his daughter is "full of it" and does not feel he needs the appointment Patient denies, SOB, LE edema, cough or chest pain, patient states he does not check his weight daily-checked sometime last week 143lbs  advised patient that daughter called with concerns of edema and increased fatigue, would he be willing to return to office for a quick visit  Pt agreeable, garage code and instructions given on how to reach [place] in the CHF clinic

## 2015-06-21 NOTE — Patient Instructions (Signed)
TAKE Lasix 20mg  (1 tablet) daily.  PLEASE WEAR COMPRESSION STOCKINGS.  FOLLOW UP in 3 MONTHS.  Labs TODAY (BMET CBC LIPIDS)  YOUR PROVIDER REQUESTS YOU HAVE AN ECHO at [facility] st location.

## 2015-06-22 ENCOUNTER — Telehealth (HOSPITAL_COMMUNITY): Payer: Self-pay | Admitting: *Deleted

## 2015-06-22 NOTE — Progress Notes (Signed)
[name], male   DOB: Nov 02, 1930, 81 y.o.   MRN: [id number] PCP: Dr. Sallyanne Havers [facility])  81 yo male with history of recurrent small bowel obstruction and CAD with ischemic cardiomyopathy presents for followup.  In 9/14, patient was hospitalized with recurrent abdominal pain from small bowel obstruction.  This was managed conservatively with NG tube/NPO and IV fluid.  In the hospital, the patient developed pulmonary edema and actually ended up being intubated.  Troponin was normal. Echo was done showing EF 35-40% with anteroseptal, anterolateral, and apical akinesis along with moderate AI.  He had no prior known cardiac disease.  It was thought that the patient had had an out of hospital MI at some point then developed pulmonary edema with IVF while NPO.  He was diuresed and extubated.  Creatinine was up to about 1.6 in the hospital so no cardiac cath was done (not thought to be urgent given normal creatinine).   I took him for [facility] in 11/14.  This showed 95% mid LAD stenosis that was treated with 2 overlapping Promus DES.  Echo in 2/15 showed EF 30-35% with normal RV.  Coreg was cut back to 6.25 bid due to bradycardia and fatigue.   Since last appointment, severe right carotid stenosis was diagnosed by carotid dopplers (bruit had been heard, asymptomatic). He saw Dr Trula Slade and was offered right CEA.  He is still thinking about this.  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  The CML seems to be under control.  Main complaint today is lower extremity edema.  It has been present since starting imatinib.  It waxes and wanes.  He has started taking Lasix 20 mg daily (had been using prn prior).  No exertional dyspnea though he fatigues more easily than in the past.  No chest pain.  No claudication. No stroke-like symptoms.   Labs (9/14): K 3.4, creatinine 1.55 => 1.4, LDL 89, HDL 38 Labs (11/14): K 4.9, creatinine 1.6 Labs (1/15): K 4.1, creatinine 1.4 Labs  (2/15): LDL 34, HDL 32 Labs (2/16): K 4.5, creatinine 1.5  PMH: 1. Nephrolithiasis 2. H/o CCY 3. H/o diverticulitis 4. H/o SBO: initially in 1/14 with small bowel resection, again in 9/14 treated conservatively.  5. Ischemic cardiomyopathy: Echo (9/14) with EF 35-40%, anteroseptal/anterolateral/apical akinesis and moderate AI.  Patient had acute pulmonary edema while hospitalized in 9/14. Echo (2/15) with EF 30-35% with wall motion abnormalities, normal RV size and systolic function, mild MR and mild AI.  6. CKD 7. Aortic insufficiency: mild on last echo 8. CAD: LHC (11/14) with 95% mLAD stenosis treated with 2 overlapping Promus DES.  There was residual 50% stenosis beyond the stent margin.   9. Carotid stenosis: 2/16 carotid dopplers with 80-99% RICA stenosis, 95-18% LICA stenosis. CTA neck showed 90% RICA stenosis.  10. CML: Diagnosed in 2016, on imatinib.   SH: Married (2nd time) with 4 children.  Lives in [place].  Previous heavy smoker.  Retired Chief Financial Officer.   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   ROS: All systems reviewed and negative except as per HPI.   Current Outpatient Prescriptions  Medication Sig Dispense Refill  . Ascorbic Acid (VITAMIN C PO) Take 1,000 mg by mouth daily.     Marland Kitchen aspirin EC 81 MG tablet Take 1 tablet (81 mg total) by mouth daily. 90 tablet 3  . atorvastatin (LIPITOR) 40 MG tablet TAKE 1 TABLET BY MOUTH DAILY AT 6 PM 30 tablet  5  . carvedilol (COREG) 12.5 MG tablet Take 0.5 tablets (6.25 mg total) by mouth 2 (two) times daily with a meal. 30 tablet 3  . Coenzyme Q10 (COQ10) 200 MG CAPS Take 1 capsule by mouth daily.    . Cyanocobalamin (VITAMIN B 12 PO) Take by mouth daily.    . furosemide (LASIX) 20 MG tablet Take 1 tablet (20 mg total) by mouth daily. Take an additional 20mg  daily as needed for swelling. 45 tablet 0  . Krill Oil 300 MG CAPS Take 1 capsule by mouth daily.    Marland Kitchen losartan (COZAAR) 25 MG tablet TAKE 1/2 TABLET BY MOUTH DAILY 30  tablet 0  . Magnesium 250 MG TABS Take 1 tablet by mouth daily.    Marland Kitchen NIACIN PO Take 1 tablet by mouth daily.    Marland Kitchen spironolactone (ALDACTONE) 25 MG tablet TAKE 1/2 TABLET BY MOUTH EVERY DAY 15 tablet 11  . Thiamine HCl (VITAMIN B-1 PO) Take 1 tablet by mouth daily.    . vitamin A 8000 UNIT capsule Take 8,000 Units by mouth daily.    Marland Kitchen VITAMIN E PO Take 1 tablet by mouth daily.    . pantoprazole (PROTONIX) 40 MG tablet TAKE 1 TABLET BY MOUTH DAILY (Patient not taking: Reported on 06/21/2015) 30 tablet 1   No current facility-administered medications for this encounter.   Facility-Administered Medications Ordered in Other Encounters  Medication Dose Route Frequency Provider Last Rate Last Dose  . midazolam (VERSED) 5 MG/5ML injection    Anesthesia Intra-op Janine Ores, CRNA   2 mg at 08/09/13 1146  . propofol (DIPRIVAN) 10 mg/mL bolus/IV push    Anesthesia Intra-op Janine Ores, CRNA   20 mg at 08/09/13 1147    BP 132/70 mmHg  Pulse 54  Wt 146 lb 8 oz (66.452 kg)  SpO2 97% General: NAD Neck: JVP 6 cm, no thyromegaly or thyroid nodule.  Lungs: Clear bilaterally CV: Nondisplaced PMI.  Heart regular S1/S2, no S3/S4, 1/6 SEM RUSB.  1+ edema on left, trace edema on right.  Soft R carotid bruit.  Normal pedal pulses.  Abdomen: Soft, nontender, no hepatosplenomegaly, no distention.  Skin: Intact without lesions or rashes.  Neurologic: Alert and oriented x 3.  Psych: Normal affect. Extremities: No clubbing or cyanosis.   Assessment/Plan: 1. Chronic systolic CHF: Ischemic cardiomyopathy, EF 30-35% in 2/15. He has peripheral edema but no JVD.  NYHA class II symptoms with more fatigue recently.  Lower extremity edema is present but no JVD.  It is possible that the peripheral edema is an effect of imatinib (this is one of the more common side effects).   - Continue current spironolactone, carvedilol and losartan.  - Take Lasix 20 mg daily.   - I would like him to get a repeat echo. -  Would avoid ICD given age. - Check BMET/BNP.      2. CAD:  He likely had an out of hospital MI prior to last hospitalization in fall 2014.  Now s/p DES to mLAD with overlapping Promus stents.  Of note, there was residual 50% stenosis just beyond the stent margin.    - Continue ASA and statin.     3. Hyperlipidemia: Continue atorvastatin, check lipids today.  4. CKD: Stable. Will check BMET today. 5. Carotid stenosis: Severe on right, moderate te on left.  No stroke-like symptoms. I recommended that he have CEA.  He is still thinking about it (has seen Dr Trula Slade).    Loralie Champagne MD 06/22/2015

## 2015-06-23 ENCOUNTER — Other Ambulatory Visit (HOSPITAL_COMMUNITY): Payer: Self-pay | Admitting: *Deleted

## 2015-06-23 MED ORDER — FUROSEMIDE 20 MG PO TABS: 20.0000 mg | ORAL_TABLET | ORAL | Status: DC

## 2015-06-24 ENCOUNTER — Encounter (HOSPITAL_COMMUNITY): Payer: Medicare HMO

## 2015-06-28 ENCOUNTER — Telehealth (HOSPITAL_COMMUNITY): Payer: Self-pay | Admitting: *Deleted

## 2015-07-13 ENCOUNTER — Other Ambulatory Visit (HOSPITAL_COMMUNITY): Payer: Self-pay | Admitting: Cardiology

## 2015-07-19 ENCOUNTER — Other Ambulatory Visit: Payer: Self-pay

## 2015-07-19 ENCOUNTER — Ambulatory Visit (HOSPITAL_COMMUNITY): Payer: Medicare HMO | Attending: Cardiology

## 2015-07-19 DIAGNOSIS — I071 Rheumatic tricuspid insufficiency: Secondary | ICD-10-CM | POA: Diagnosis not present

## 2015-07-19 DIAGNOSIS — I340 Nonrheumatic mitral (valve) insufficiency: Secondary | ICD-10-CM | POA: Insufficient documentation

## 2015-07-19 DIAGNOSIS — I517 Cardiomegaly: Secondary | ICD-10-CM | POA: Diagnosis not present

## 2015-07-19 DIAGNOSIS — I5022 Chronic systolic (congestive) heart failure: Secondary | ICD-10-CM | POA: Diagnosis not present

## 2015-07-19 DIAGNOSIS — I351 Nonrheumatic aortic (valve) insufficiency: Secondary | ICD-10-CM | POA: Insufficient documentation

## 2015-07-20 ENCOUNTER — Other Ambulatory Visit: Payer: Self-pay | Admitting: Cardiology

## 2015-07-22 ENCOUNTER — Telehealth: Payer: Self-pay | Admitting: Cardiology

## 2015-07-22 NOTE — Telephone Encounter (Signed)
I had told him before that I thought it would be a good idea and that his risk is not markedly high from it.

## 2015-07-22 NOTE — Telephone Encounter (Signed)
New message     Pt daughter has questions regarding echo test results Please call to discuss

## 2015-07-22 NOTE — Telephone Encounter (Signed)
Spoke w/pt's daughter regarding results, she is aware and would like Dr Claris Gladden recommendation for pt to have carotid surgery will send to him to review and give her a call back

## 2015-07-23 NOTE — Telephone Encounter (Signed)
Left message to call back  

## 2015-07-28 NOTE — Telephone Encounter (Signed)
Left message to call back  

## 2015-07-29 NOTE — Telephone Encounter (Signed)
Spoke w/pts daughter she is aware and agreeable

## 2015-08-09 ENCOUNTER — Ambulatory Visit: Payer: Medicare HMO | Admitting: Surgery

## 2015-08-11 ENCOUNTER — Ambulatory Visit: Payer: Medicare HMO | Admitting: Cardiology

## 2015-08-11 ENCOUNTER — Encounter: Payer: Self-pay | Admitting: Surgery

## 2015-08-16 ENCOUNTER — Encounter: Payer: Self-pay | Admitting: Surgery

## 2015-08-16 ENCOUNTER — Ambulatory Visit (INDEPENDENT_AMBULATORY_CARE_PROVIDER_SITE_OTHER): Payer: Medicare HMO | Admitting: Surgery

## 2015-08-16 VITALS — BP 125/59 | HR 59 | Ht 69.0 in | Wt 150.0 lb

## 2015-08-16 DIAGNOSIS — I6523 Occlusion and stenosis of bilateral carotid arteries: Secondary | ICD-10-CM

## 2015-08-16 NOTE — Progress Notes (Signed)
Patient name: [name] [name] MRN: [id number] DOB: 1930-05-28 Sex: male     Chief Complaint  Patient presents with  . Re-evaluation    OV to discuss CEA    HISTORY OF PRESENT ILLNESS:  The patient was initially referred to me several months ago for carotid stenosis. This was detected by hearing a bruit which led to an ultrasound. He has an outside CT scan that shows approximately 90% right carotid stenosis.   I scheduled him for right carotid endarterectomy which he delayed.  I needed to have clearance from cardiology which I have obtained.  I'll also like clearance from his oncologist who is treating him with Glivac  For CML.  He takes a statin for hypercholesterolemia. He takes an ARB for hypertension.  In September 2014, he was hospitalized with recurrent abdominal pain from SBO. It was managed conservatively with NGT and IVF. He developed pulmonary edema and and was intubated. Echo revealed 35-40%. He was diuresed and extubated. At that time, his creatinine was slightly elevated at 1.6 and was taken to the cath lab at a later date (Novemeber 2014) and was found to have a mid LAD stenosis and had a DES placed. He is on Plavix for this.   He states that he does have some pain in his feet at rest. He tells me that he had a herniated disk in the past and has had trouble with numbness and tingling in his legs. He states that after he underwent a cholecystectomy, he did not have any back pain after that.    Past Medical History  Diagnosis Date  . Small bowel perforation     a. 11/2012 s/p emergent SB resection.  . Diverticulitis   . CKD (chronic kidney disease), stage III   . Coronary artery disease     a. 09/2013 Cath: LM min irregs, LAD 95p(2.75x20 Promus Premier DES & 3.0x20 Promus Premier DES overlapping), D1 50-60, LCX 30p, OM1 37m, OM2 60-70p, RCA small, nl.  . Ischemic cardiomyopathy     a. 07/2013 Echo: EF 35-40%, apical septal, apical lat, apical AK, mod MR.  . Chronic  systolic CHF (congestive heart failure)     a. 07/2013 Echo: EF 35-40%  . Hyperlipidemia   . Carotid artery occlusion     Past Surgical History  Procedure Laterality Date  . Laparoscopic cholecystectomy    . Inguinal hernia repair    . Cardiac catheterization  10/09/2013  . Coronary angioplasty  10/09/2013    MID LAD  . Colon surgery  11/2012    BOWEL SURGERY   . Left heart catheterization with coronary angiogram N/A 10/09/2013    Procedure: LEFT HEART CATHETERIZATION WITH CORONARY ANGIOGRAM;  Surgeon: Larey Dresser, MD;  Location: [name] CATH LAB;  Service: Cardiovascular;  Laterality: N/A;  . Percutaneous coronary stent intervention (pci-s)  10/09/2013    Procedure: PERCUTANEOUS CORONARY STENT INTERVENTION (PCI-S);  Surgeon: Larey Dresser, MD;  Location: [facility] CATH LAB;  Service: Cardiovascular;;    Social History   Social History  . Marital Status: Married    Spouse Name: N/A  . Number of Children: N/A  . Years of Education: N/A   Occupational History  . Not on file.   Social History Main Topics  . Smoking status: Former Smoker -- 1.00 packs/day for 45 years    Types: Cigarettes    Quit date: 06/09/1999  . Smokeless tobacco: Never Used  . Alcohol Use: No  . Drug Use: No  .  Sexual Activity: Not Currently   Other Topics Concern  . Not on file   Social History Narrative   Pt lives in [place] with his wife of 14 yrs (second wife).  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  Very active @ home.  Still push mows his yard.    Family History  Problem Relation Age of Onset  . Emphysema Father     died @ 4  . Diabetes Mother     died @ 39    Allergies as of August 28, 2015  . (No Known Allergies)    Current Outpatient Prescriptions on File Prior to Visit  Medication Sig Dispense Refill  . Ascorbic Acid (VITAMIN C PO) Take 1,000 mg by mouth daily.     Marland Kitchen aspirin EC 81 MG tablet Take 1 tablet (81 mg total) by mouth daily. 90 tablet 3    . atorvastatin (LIPITOR) 40 MG tablet TAKE 1 TABLET BY MOUTH DAILY AT 6 PM 30 tablet 5  . carvedilol (COREG) 12.5 MG tablet Take 0.5 tablets (6.25 mg total) by mouth 2 (two) times daily with a meal. 30 tablet 3  . Coenzyme Q10 (COQ10) 200 MG CAPS Take 1 capsule by mouth daily.    . Cyanocobalamin (VITAMIN B 12 PO) Take by mouth daily.    . furosemide (LASIX) 20 MG tablet Take 1 tablet (20 mg total) by mouth every other day. Take an additional 20mg  daily as needed for swelling. 45 tablet 0  . furosemide (LASIX) 20 MG tablet TAKE 1 TABLET BY MOUTH DAILY. TAKE AN ADDITIONAL TABLET DAILY AS NEEDED 45 tablet 0  . Krill Oil 300 MG CAPS Take 1 capsule by mouth daily.    Marland Kitchen losartan (COZAAR) 25 MG tablet TAKE 1/2 TABLET BY MOUTH DAILY 30 tablet 0  . Magnesium 250 MG TABS Take 1 tablet by mouth daily.    Marland Kitchen NIACIN PO Take 1 tablet by mouth daily.    Marland Kitchen spironolactone (ALDACTONE) 25 MG tablet TAKE 1/2 TABLET BY MOUTH EVERY DAY 15 tablet 11  . Thiamine HCl (VITAMIN B-1 PO) Take 1 tablet by mouth daily.    . vitamin A 8000 UNIT capsule Take 8,000 Units by mouth daily.    Marland Kitchen VITAMIN E PO Take 1 tablet by mouth daily.    . pantoprazole (PROTONIX) 40 MG tablet TAKE 1 TABLET BY MOUTH DAILY (Patient not taking: Reported on 08-28-15) 30 tablet 1   Current Facility-Administered Medications on File Prior to Visit  Medication Dose Route Frequency Provider Last Rate Last Dose  . midazolam (VERSED) 5 MG/5ML injection    Anesthesia Intra-op Janine Ores, CRNA   2 mg at 08/09/13 1146  . propofol (DIPRIVAN) 10 mg/mL bolus/IV push    Anesthesia Intra-op Janine Ores, CRNA   20 mg at 08/09/13 1147     REVIEW OF SYSTEMS: Cardiovascular: No chest pain, chest pressure, palpitations, orthopnea, or dyspnea on exertion. No claudication or rest pain,  No history of DVT or phlebitis. Positive for foot pain at rest Pulmonary: No productive cough, asthma or wheezing. Neurologic: No weakness, paresthesias, aphasia, or  amaurosis. No dizziness. Hematologic: No bleeding problems or clotting disorders. Musculoskeletal: No joint pain or joint swelling. Gastrointestinal: No blood in stool or hematemesis Genitourinary: No dysuria or hematuria. Psychiatric:: No history of major depression. Integumentary: No rashes or ulcers. Constitutional: No fever or chills.  PHYSICAL EXAMINATION:   Vital signs are  Filed Vitals:   August 28, 2015 1435 28-Aug-2015 1436  BP: 132/60 125/59  Pulse: 59   Height: 5\' 9"  (1.753 m)   Weight: 150 lb (68.04 kg)   SpO2: 100%    Body mass index is 22.14 kg/(m^2). General: The patient appears their stated age. HEENT:  No gross abnormalities Pulmonary:  Non labored breathing Abdomen: Soft and non-tender Musculoskeletal: There are no major deformities. Neurologic: No focal weakness or paresthesias are detected, Skin: There are no ulcer or rashes noted. Psychiatric: The patient has normal affect. Cardiovascular: There is a regular rate and rhythm without significant murmur appreciated. Positive carotid bruit   Diagnostic Studies  outside CT scan shows 90% right carotid stenosis.  Assessment:  asymptomatic right carotid stenosis Plan:  the patient initially had conflicting ultrasound studies about the degree of stenosis.  A CT scan was performed to define his degree of stenosis which confirmed 90%.  He remained asymptomatic.  I have obtained cardiac clearance to proceed.  Per the patient, he is no longer taking Plavix.  This is not on his most recent medication record.  I would like to get formal clearance from his oncologist.  I am unsure of the surgical risks with Glivac , or if this needs to be  Discontinued in advance of his operation.  Once this is been completed, we will proceed with right carotid endarterectomy.  I discussed the risks and benefits the operation with the patient's family including his wife and daughter.  We discussed the risk of stroke, nerve injury, and bleeding , as  well as facial numbness.  He understands this and once to proceed at this time.  Eldridge Abrahams, M.D. [facility] Office: [phone] Pager:  [phone]

## 2015-08-17 ENCOUNTER — Other Ambulatory Visit: Payer: Self-pay | Admitting: Cardiology

## 2015-08-20 ENCOUNTER — Other Ambulatory Visit: Payer: Self-pay

## 2015-08-27 ENCOUNTER — Ambulatory Visit: Payer: Medicare HMO | Admitting: Cardiology

## 2015-09-02 ENCOUNTER — Encounter (HOSPITAL_COMMUNITY): Payer: Medicare HMO

## 2015-09-02 ENCOUNTER — Other Ambulatory Visit: Payer: Self-pay | Admitting: Cardiology

## 2015-09-06 ENCOUNTER — Encounter (HOSPITAL_BASED_OUTPATIENT_CLINIC_OR_DEPARTMENT_OTHER): Payer: Self-pay | Admitting: Emergency Medicine

## 2015-09-06 ENCOUNTER — Emergency Department (HOSPITAL_BASED_OUTPATIENT_CLINIC_OR_DEPARTMENT_OTHER)
Admission: EM | Admit: 2015-09-06 | Discharge: 2015-09-06 | Disposition: A | Discharge status: Home / Self Care | Payer: Medicare HMO | Attending: Emergency Medicine | Admitting: Emergency Medicine

## 2015-09-06 ENCOUNTER — Emergency Department (HOSPITAL_BASED_OUTPATIENT_CLINIC_OR_DEPARTMENT_OTHER): Payer: Medicare HMO

## 2015-09-06 DIAGNOSIS — Z8719 Personal history of other diseases of the digestive system: Secondary | ICD-10-CM | POA: Diagnosis not present

## 2015-09-06 DIAGNOSIS — N183 Chronic kidney disease, stage 3 (moderate): Secondary | ICD-10-CM | POA: Insufficient documentation

## 2015-09-06 DIAGNOSIS — Z79899 Other long term (current) drug therapy: Secondary | ICD-10-CM | POA: Diagnosis not present

## 2015-09-06 DIAGNOSIS — R1030 Lower abdominal pain, unspecified: Secondary | ICD-10-CM | POA: Diagnosis present

## 2015-09-06 DIAGNOSIS — E785 Hyperlipidemia, unspecified: Secondary | ICD-10-CM | POA: Insufficient documentation

## 2015-09-06 DIAGNOSIS — Z87891 Personal history of nicotine dependence: Secondary | ICD-10-CM | POA: Insufficient documentation

## 2015-09-06 DIAGNOSIS — I2510 Atherosclerotic heart disease of native coronary artery without angina pectoris: Secondary | ICD-10-CM | POA: Diagnosis not present

## 2015-09-06 DIAGNOSIS — Z9889 Other specified postprocedural states: Secondary | ICD-10-CM | POA: Diagnosis not present

## 2015-09-06 DIAGNOSIS — N201 Calculus of ureter: Secondary | ICD-10-CM | POA: Diagnosis not present

## 2015-09-06 DIAGNOSIS — N289 Disorder of kidney and ureter, unspecified: Secondary | ICD-10-CM | POA: Insufficient documentation

## 2015-09-06 DIAGNOSIS — Z7982 Long term (current) use of aspirin: Secondary | ICD-10-CM | POA: Insufficient documentation

## 2015-09-06 DIAGNOSIS — I5022 Chronic systolic (congestive) heart failure: Secondary | ICD-10-CM | POA: Insufficient documentation

## 2015-09-06 DIAGNOSIS — Z9861 Coronary angioplasty status: Secondary | ICD-10-CM | POA: Diagnosis not present

## 2015-09-06 LAB — URINALYSIS, ROUTINE W REFLEX MICROSCOPIC
Bilirubin Urine: NEGATIVE
Glucose, UA: NEGATIVE mg/dL
Hgb urine dipstick: NEGATIVE
Ketones, ur: NEGATIVE mg/dL
Leukocytes, UA: NEGATIVE
Nitrite: NEGATIVE
Protein, ur: NEGATIVE mg/dL
Specific Gravity, Urine: 1.007 (ref 1.005–1.030)
Urobilinogen, UA: 0.2 mg/dL (ref 0.0–1.0)
pH: 6 (ref 5.0–8.0)

## 2015-09-06 LAB — DIFFERENTIAL
Basophils Absolute: 0 10*3/uL (ref 0.0–0.1)
Basophils Relative: 0 %
Eosinophils Absolute: 0.4 10*3/uL (ref 0.0–0.7)
Eosinophils Relative: 7 %
Lymphocytes Relative: 18 %
Lymphs Abs: 1.1 10*3/uL (ref 0.7–4.0)
Monocytes Absolute: 0.7 10*3/uL (ref 0.1–1.0)
Monocytes Relative: 11 %
Neutro Abs: 4 10*3/uL (ref 1.7–7.7)
Neutrophils Relative %: 64 %

## 2015-09-06 LAB — COMPREHENSIVE METABOLIC PANEL
ALT: 19 U/L (ref 17–63)
AST: 31 U/L (ref 15–41)
Albumin: 3.6 g/dL (ref 3.5–5.0)
Alkaline Phosphatase: 88 U/L (ref 38–126)
Anion gap: 5 (ref 5–15)
BUN: 15 mg/dL (ref 6–20)
CO2: 27 mmol/L (ref 22–32)
Calcium: 7.3 mg/dL — ABNORMAL LOW (ref 8.9–10.3)
Chloride: 107 mmol/L (ref 101–111)
Creatinine, Ser: 1.46 mg/dL — ABNORMAL HIGH (ref 0.61–1.24)
GFR calc Af Amer: 50 mL/min — ABNORMAL LOW (ref >60)
GFR calc non Af Amer: 43 mL/min — ABNORMAL LOW (ref >60)
Glucose, Bld: 104 mg/dL — ABNORMAL HIGH (ref 65–99)
Potassium: 3.6 mmol/L (ref 3.5–5.1)
Sodium: 139 mmol/L (ref 135–145)
Total Bilirubin: 0.7 mg/dL (ref 0.3–1.2)
Total Protein: 6.3 g/dL — ABNORMAL LOW (ref 6.5–8.1)

## 2015-09-06 LAB — CBC
HCT: 35.8 % — ABNORMAL LOW (ref 39.0–52.0)
Hemoglobin: 11.6 g/dL — ABNORMAL LOW (ref 13.0–17.0)
MCH: 32.9 pg (ref 26.0–34.0)
MCHC: 32.4 g/dL (ref 30.0–36.0)
MCV: 101.4 fL — ABNORMAL HIGH (ref 78.0–100.0)
Platelets: 137 10*3/uL — ABNORMAL LOW (ref 150–400)
RBC: 3.53 MIL/uL — ABNORMAL LOW (ref 4.22–5.81)
RDW: 13.9 % (ref 11.5–15.5)
WBC: 6.2 10*3/uL (ref 4.0–10.5)

## 2015-09-06 LAB — LIPASE, BLOOD: Lipase: 32 U/L (ref 22–51)

## 2015-09-06 LAB — I-STAT CG4 LACTIC ACID, ED: Lactic Acid, Venous: 0.83 mmol/L (ref 0.5–2.0)

## 2015-09-06 IMAGING — CT CT ABD-PELV W/O CM
1 of 2 series · 15 of 32 positions shown, 19 images · non-contrast
Comparison: None.

CLINICAL DATA: Left lower quadrant pain for 3 days

EXAM:
CT ABDOMEN AND PELVIS WITHOUT CONTRAST
TECHNIQUE: Multidetector CT imaging of the abdomen and pelvis was performed
following the standard protocol without IV contrast.

[Series 2: abd/pelvis 5.0 b31f · axial · 0.68mm/px · z∈[-528,-118]mm · 15 of 90 slices shown, 19 images]
[im 4/90  soft-tissue]
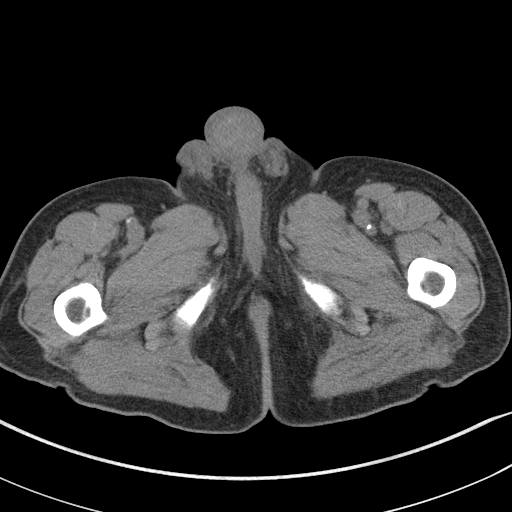
[im 4/90  bone]
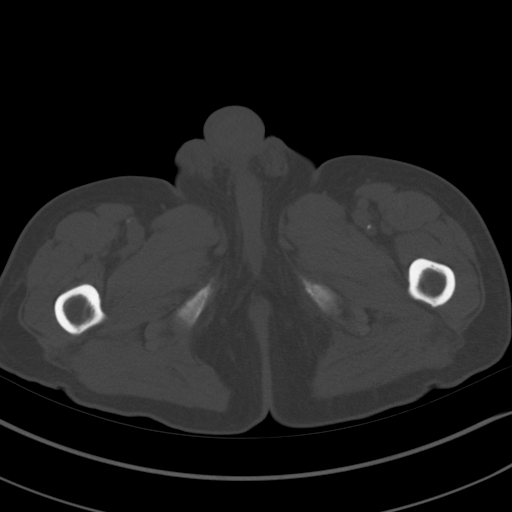
[im 12/90  soft-tissue]
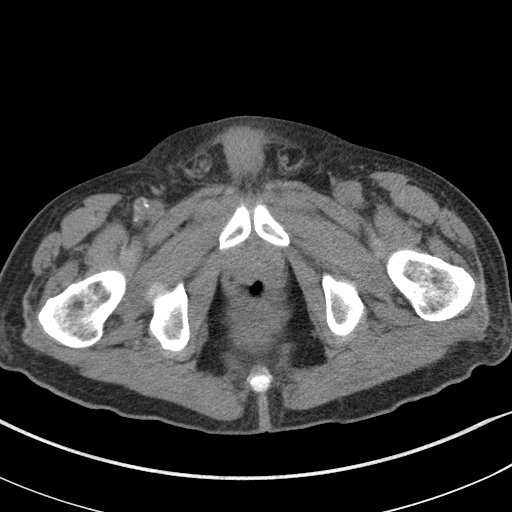
[im 20/90  soft-tissue]
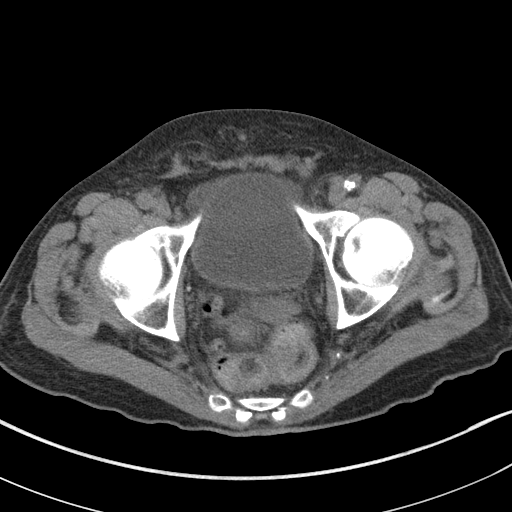
[im 24/90  soft-tissue]
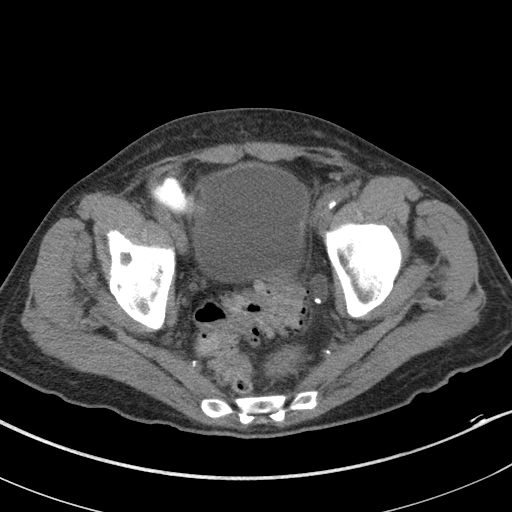
[im 31/90  soft-tissue]
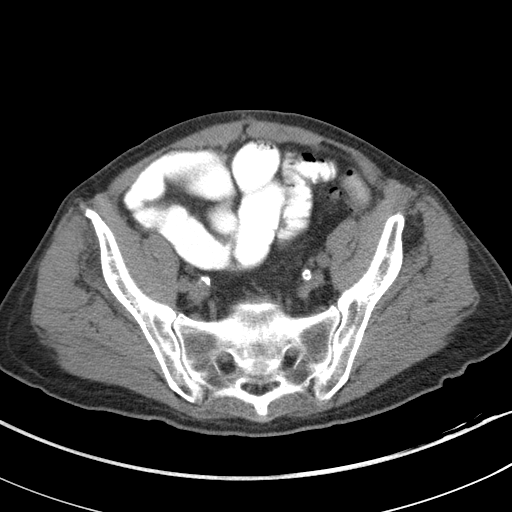
[im 39/90  soft-tissue]
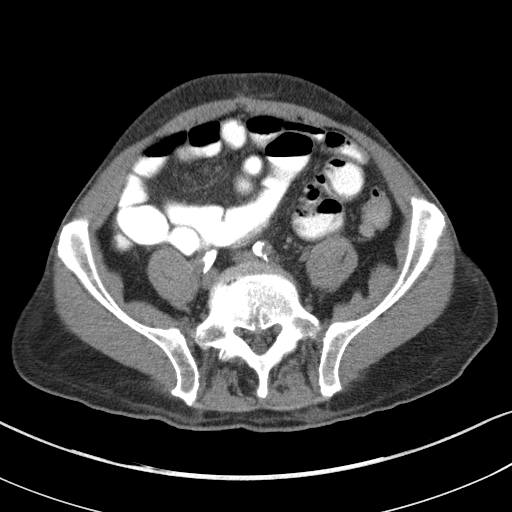
[im 47/90  soft-tissue]
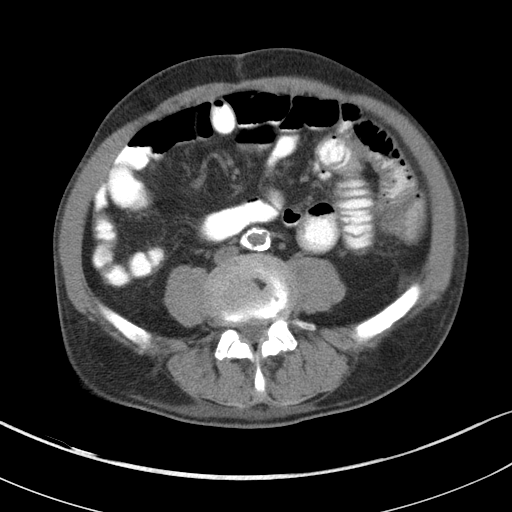
[im 51/90  soft-tissue]
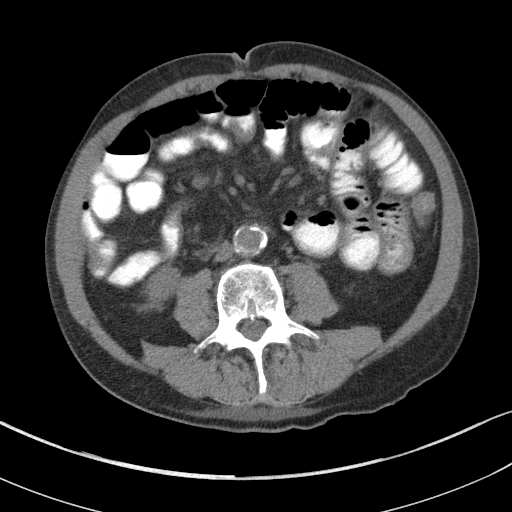
[im 59/90  soft-tissue]
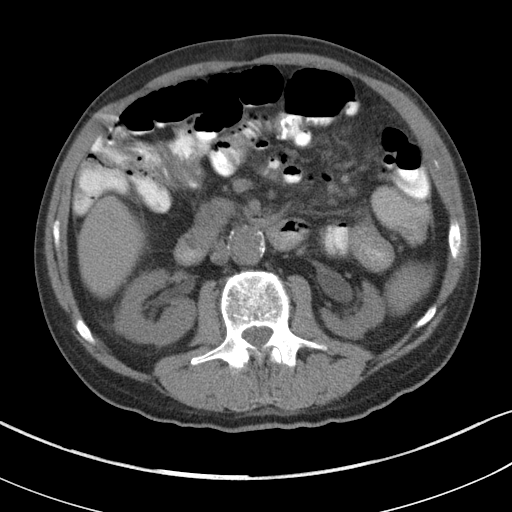
[im 59/90  bone]
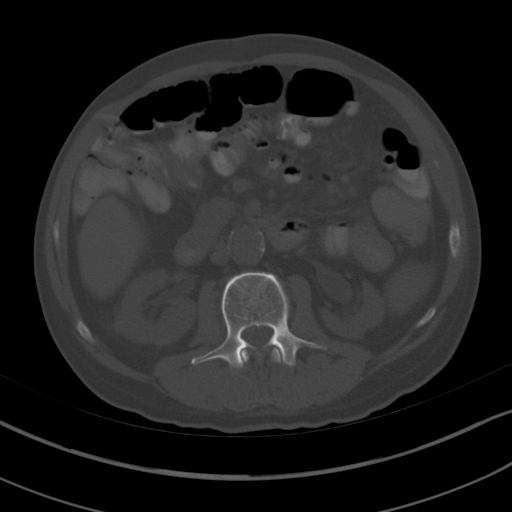
[im 66/90  soft-tissue]
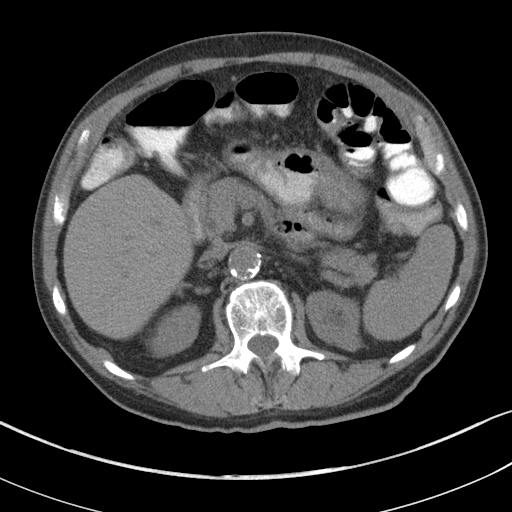
[im 70/90  soft-tissue]
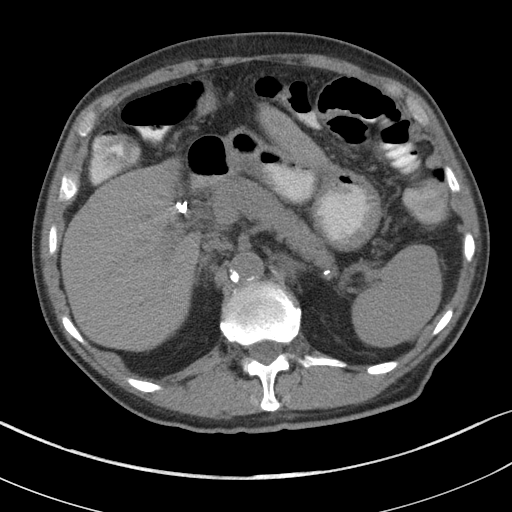
[im 74/90  lung]
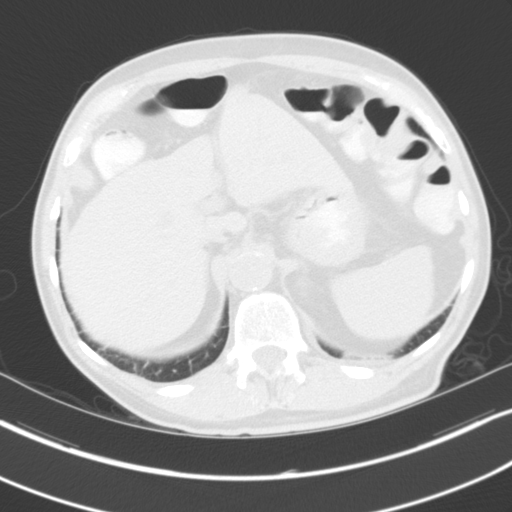
[im 78/90  soft-tissue]
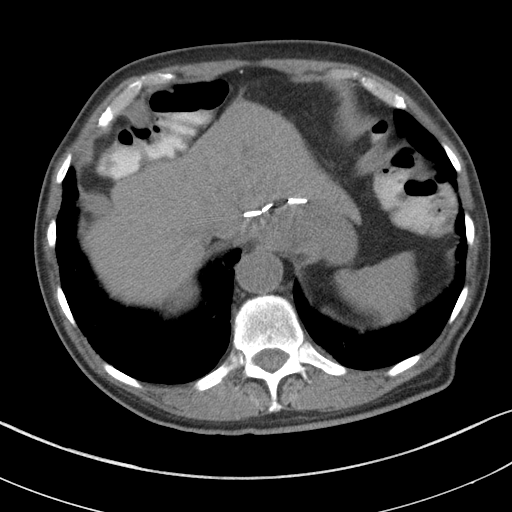
[im 78/90  lung]
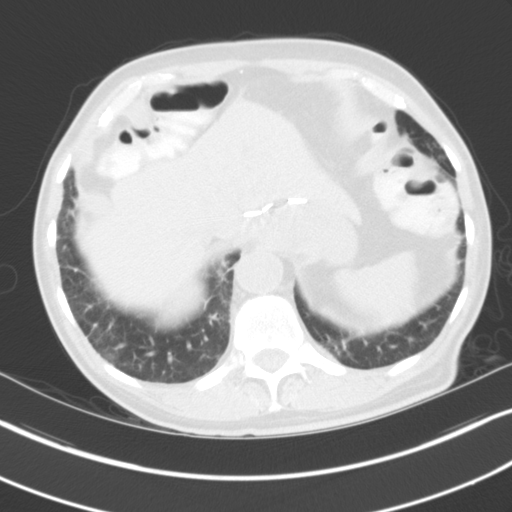
[im 82/90  lung]
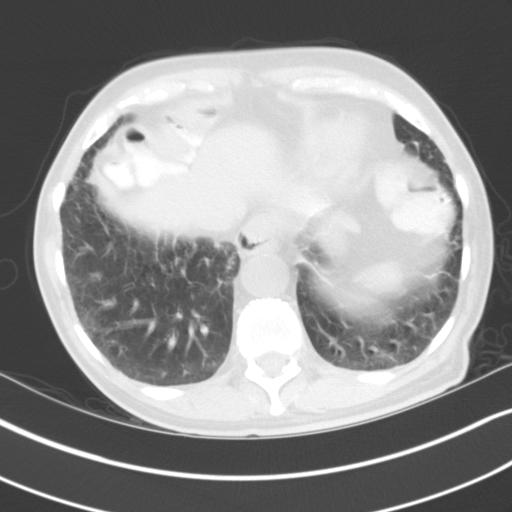
[im 86/90  soft-tissue]
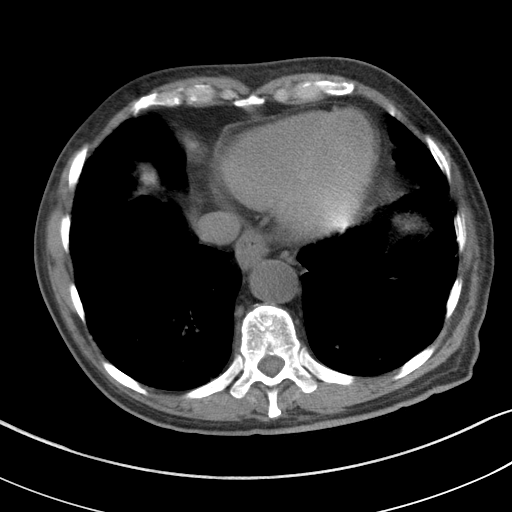
[im 86/90  lung]
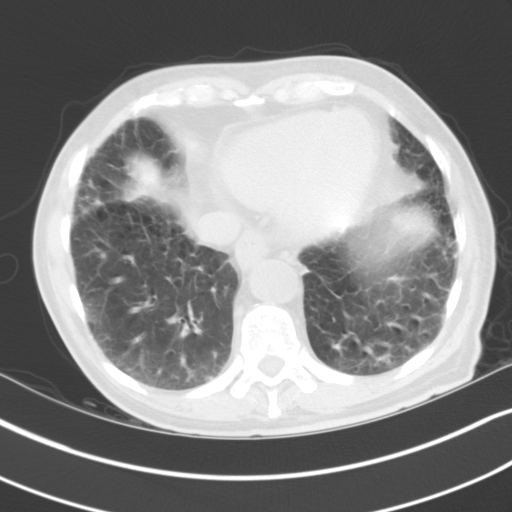

[15 of 32 positions shown; findings below may reference images not displayed]

FINDINGS: Emphysematous changes are noted in the lung bases bilaterally.

The gallbladder has been surgically removed. The liver, spleen,
adrenal glands and pancreas are all normal in their CT appearance.
The kidneys are well visualized bilaterally and demonstrate some
nonobstructing renal stones on the left. Fullness of the left renal
collecting system is seen although no definitive obstructive changes
are seen. There is a stable dilated area in the distal left ureter
best seen on image number 67 of series 2 which contains a small 5 mm
stone.

Aortoiliac calcifications are noted with changes of prior dissection
which are stable from the prior study. Diffuse diverticular change
of the descending and sigmoid colon is noted without evidence of
diverticulitis. The appendix is not well visualized although no
inflammatory changes are noted. Mild prominence of the small bowel
is noted although no true obstructive changes are seen. This may
represent a mild generalized ileus.

The bladder is well distended. No pelvic mass lesion is seen. No
acute bony abnormality is noted.
IMPRESSION: Nonobstructing left renal calculi stable from the prior exam.

Stable dilated distal left ureter containing a small 5 mm stone. No
acute obstructive changes are noted.

Diverticulosis without diverticulitis.

## 2015-09-06 MED ORDER — TAMSULOSIN HCL 0.4 MG PO CAPS: 0.4000 mg | ORAL_CAPSULE | Freq: Every day | ORAL | Status: DC

## 2015-09-06 MED ORDER — OXYCODONE-ACETAMINOPHEN 5-325 MG PO TABS: 1.0000 | ORAL_TABLET | ORAL | Status: DC | PRN

## 2015-09-06 MED ORDER — IOHEXOL 300 MG/ML  SOLN
50.0000 mL | Freq: Once | INTRAMUSCULAR | Status: AC | PRN
  Administered 2015-09-06: 50 mL via ORAL

## 2015-09-06 MED ORDER — MORPHINE SULFATE (PF) 4 MG/ML IV SOLN
4.0000 mg | Freq: Once | INTRAVENOUS | Status: DC
  Filled 2015-09-06: qty 1

## 2015-09-06 NOTE — ED Provider Notes (Signed)
CSN: [id number]     Arrival date & time 09/06/15  1623 History  By signing my name below, I, [name] [name], attest that this documentation has been prepared under the direction and in the presence of [name] Fuel, MD. Electronically Signed: Meriel [name], ED Scribe. 09/06/2015. 6:24 PM.   Chief Complaint  Patient presents with  . Groin Pain   The history is provided by the patient. No language interpreter was used.   HPI Comments: [name] [name] is a 81 y.o. male, with a PMhx of CKD and hernias and PShx of left inguinal hernia repair, who presents to the Emergency Department complaining of sudden onset, constant left suprapubic abdominal pain that is only present with movement onset 3 days ago after lifting bags into his car. He rates the pain as severe with movement but notes his pain is alleviated completely when he is stationary. He describes the pain to be similar to past pain experienced with hernias. Pt states he is able to feel a raised area in his left groin. PCP: Drake Leach, MD.  Past Medical History  Diagnosis Date  . Small bowel perforation ([place])     a. 11/2012 s/p emergent SB resection.  . Diverticulitis   . CKD (chronic kidney disease), stage III   . Coronary artery disease     a. 09/2013 Cath: LM min irregs, LAD 95p(2.75x20 Promus Premier DES & 3.0x20 Promus Premier DES overlapping), D1 50-60, LCX 30p, OM1 90m, OM2 60-70p, RCA small, nl.  . Ischemic cardiomyopathy     a. 07/2013 Echo: EF 35-40%, apical septal, apical lat, apical AK, mod MR.  . Chronic systolic CHF (congestive heart failure) ([place])     a. 07/2013 Echo: EF 35-40%  . Hyperlipidemia   . Carotid artery occlusion    Past Surgical History  Procedure Laterality Date  . Laparoscopic cholecystectomy    . Inguinal hernia repair    . Cardiac catheterization  10/09/2013  . Coronary angioplasty  10/09/2013    MID LAD  . Colon surgery  11/2012    BOWEL SURGERY   . Left heart catheterization with coronary angiogram  N/A 10/09/2013    Procedure: LEFT HEART CATHETERIZATION WITH CORONARY ANGIOGRAM;  Surgeon: Larey Dresser, MD;  Location: [facility] CATH LAB;  Service: Cardiovascular;  Laterality: N/A;  . Percutaneous coronary stent intervention (pci-s)  10/09/2013    Procedure: PERCUTANEOUS CORONARY STENT INTERVENTION (PCI-S);  Surgeon: Larey Dresser, MD;  Location: [facility] CATH LAB;  Service: Cardiovascular;;   Family History  Problem Relation Age of Onset  . Emphysema Father     died @ 41  . Diabetes Mother     died @ 47   Social History  Substance Use Topics  . Smoking status: Former Smoker -- 1.00 packs/day for 45 years    Types: Cigarettes    Quit date: 06/09/1999  . Smokeless tobacco: Never Used  . Alcohol Use: No    Review of Systems  Constitutional: Negative for fever.  Gastrointestinal: Positive for abdominal pain ( left suprapubic ).  All other systems reviewed and are negative.  Allergies  Review of patient's allergies indicates no known allergies.  Home Medications   Prior to Admission medications   Medication Sig Start Date End Date Taking? Authorizing Provider  Ascorbic Acid (VITAMIN C PO) Take 1,000 mg by mouth daily.     Historical Provider, MD  aspirin EC 81 MG tablet Take 1 tablet (81 mg total) by mouth daily. 10/02/13   Larey Dresser, MD  atorvastatin (LIPITOR) 40 MG tablet TAKE 1 TABLET BY MOUTH DAILY AT 6:00 PM 08/17/15   Larey Dresser, MD  carvedilol (COREG) 12.5 MG tablet Take 0.5 tablets (6.25 mg total) by mouth 2 (two) times daily with a meal. 06/21/15   Larey Dresser, MD  Coenzyme Q10 (COQ10) 200 MG CAPS Take 1 capsule by mouth daily.    Historical Provider, MD  Cyanocobalamin (VITAMIN B 12 PO) Take by mouth daily.    Historical Provider, MD  furosemide (LASIX) 20 MG tablet Take 1 tablet (20 mg total) by mouth every other day. Take an additional 20mg  daily as needed for swelling. 06/23/15   Larey Dresser, MD  furosemide (LASIX) 20 MG tablet TAKE 1 TABLET BY MOUTH DAILY.  TAKE AN ADDITIONAL TABLET DAILY AS NEEDED 07/13/15   Jolaine Artist, MD  Javier Docker Oil 300 MG CAPS Take 1 capsule by mouth daily.    Historical Provider, MD  Magnesium 250 MG TABS Take 1 tablet by mouth daily.    Historical Provider, MD  NIACIN PO Take 1 tablet by mouth daily.    Historical Provider, MD  pantoprazole (PROTONIX) 40 MG tablet TAKE 1 TABLET BY MOUTH DAILY Patient not taking: Reported on 08/16/2015 10/28/14   Larey Dresser, MD  spironolactone (ALDACTONE) 25 MG tablet TAKE 1/2 TABLET BY MOUTH EVERY DAY 09/02/15   Larey Dresser, MD  Thiamine HCl (VITAMIN B-1 PO) Take 1 tablet by mouth daily.    Historical Provider, MD  vitamin A 8000 UNIT capsule Take 8,000 Units by mouth daily.    Historical Provider, MD  VITAMIN E PO Take 1 tablet by mouth daily.    Historical Provider, MD   BP 148/72 mmHg  Pulse 67  Temp(Src) 98.4 F (36.9 C) (Oral)  Resp 16  Ht 5\' 9"  (1.753 m)  Wt 142 lb (64.411 kg)  BMI 20.96 kg/m2  SpO2 100% Physical Exam  Constitutional: He is oriented to person, place, and time. He appears well-developed and well-nourished.  HENT:  Head: Normocephalic and atraumatic.  Eyes: EOM are normal. Pupils are equal, round, and reactive to light.  Neck: Normal range of motion. Neck supple. No JVD present.  Cardiovascular: Normal rate, regular rhythm and normal heart sounds.   No murmur heard. Pulmonary/Chest: Effort normal and breath sounds normal. He has no wheezes. He has no rales. He exhibits no tenderness.  Abdominal: Soft. Bowel sounds are normal. He exhibits no distension and no mass. There is tenderness. There is no rebound and no guarding.  Localized tenderness to left suprapubic area, no hernia identified.   Genitourinary:  Circumcised penis, testes descended without masses or tenderness, no inguinal masses or hernias.   Musculoskeletal: Normal range of motion. He exhibits no edema.  Lymphadenopathy:    He has no cervical adenopathy.  Neurological: He is alert  and oriented to person, place, and time. No cranial nerve deficit. He exhibits normal muscle tone. Coordination normal.  Skin: Skin is warm and dry. No rash noted.  Psychiatric: He has a normal mood and affect. His behavior is normal. Judgment and thought content normal.  Nursing note and vitals reviewed.   ED Course  Procedures  DIAGNOSTIC STUDIES: Oxygen Saturation is 100% on RA, normal by my interpretation.    COORDINATION OF CARE: 6:18 PM Discussed treatment plan with pt at bedside and pt agreed to plan. Will order pain management medication, diagnostic labs and CT abdomen/pelvis.    Labs Review Results for orders placed or performed  during the hospital encounter of 09/06/15  Lipase, blood  Result Value Ref Range   Lipase 32 22 - 51 U/L  Comprehensive metabolic panel  Result Value Ref Range   Sodium 139 135 - 145 mmol/L   Potassium 3.6 3.5 - 5.1 mmol/L   Chloride 107 101 - 111 mmol/L   CO2 27 22 - 32 mmol/L   Glucose, Bld 104 (H) 65 - 99 mg/dL   BUN 15 6 - 20 mg/dL   Creatinine, Ser 1.46 (H) 0.61 - 1.24 mg/dL   Calcium 7.3 (L) 8.9 - 10.3 mg/dL   Total Protein 6.3 (L) 6.5 - 8.1 g/dL   Albumin 3.6 3.5 - 5.0 g/dL   AST 31 15 - 41 U/L   ALT 19 17 - 63 U/L   Alkaline Phosphatase 88 38 - 126 U/L   Total Bilirubin 0.7 0.3 - 1.2 mg/dL   GFR calc non Af Amer 43 (L) >60 mL/min   GFR calc Af Amer 50 (L) >60 mL/min   Anion gap 5 5 - 15  CBC  Result Value Ref Range   WBC 6.2 4.0 - 10.5 K/uL   RBC 3.53 (L) 4.22 - 5.81 MIL/uL   Hemoglobin 11.6 (L) 13.0 - 17.0 g/dL   HCT 35.8 (L) 39.0 - 52.0 %   MCV 101.4 (H) 78.0 - 100.0 fL   MCH 32.9 26.0 - 34.0 pg   MCHC 32.4 30.0 - 36.0 g/dL   RDW 13.9 11.5 - 15.5 %   Platelets 137 (L) 150 - 400 K/uL  Urinalysis, Routine w reflex microscopic (not at [facility])  Result Value Ref Range   Color, Urine YELLOW YELLOW   APPearance CLEAR CLEAR   Specific Gravity, Urine 1.007 1.005 - 1.030   pH 6.0 5.0 - 8.0   Glucose, UA NEGATIVE NEGATIVE mg/dL    Hgb urine dipstick NEGATIVE NEGATIVE   Bilirubin Urine NEGATIVE NEGATIVE   Ketones, ur NEGATIVE NEGATIVE mg/dL   Protein, ur NEGATIVE NEGATIVE mg/dL   Urobilinogen, UA 0.2 0.0 - 1.0 mg/dL   Nitrite NEGATIVE NEGATIVE   Leukocytes, UA NEGATIVE NEGATIVE  Differential  Result Value Ref Range   Neutrophils Relative % 64 %   Neutro Abs 4.0 1.7 - 7.7 K/uL   Lymphocytes Relative 18 %   Lymphs Abs 1.1 0.7 - 4.0 K/uL   Monocytes Relative 11 %   Monocytes Absolute 0.7 0.1 - 1.0 K/uL   Eosinophils Relative 7 %   Eosinophils Absolute 0.4 0.0 - 0.7 K/uL   Basophils Relative 0 %   Basophils Absolute 0.0 0.0 - 0.1 K/uL  I-Stat CG4 Lactic Acid, ED  Result Value Ref Range   Lactic Acid, Venous 0.83 0.5 - 2.0 mmol/L    Imaging Review Ct Abdomen Pelvis Wo Contrast  09/06/2015  CLINICAL DATA:  Left lower quadrant pain for 3 days EXAM: CT ABDOMEN AND PELVIS WITHOUT CONTRAST TECHNIQUE: Multidetector CT imaging of the abdomen and pelvis was performed following the standard protocol without IV contrast. COMPARISON:  None. FINDINGS: Emphysematous changes are noted in the lung bases bilaterally. The gallbladder has been surgically removed. The liver, spleen, adrenal glands and pancreas are all normal in their CT appearance. The kidneys are well visualized bilaterally and demonstrate some nonobstructing renal stones on the left. Fullness of the left renal collecting system is seen although no definitive obstructive changes are seen. There is a stable dilated area in the distal left ureter best seen on image number 67 of series 2 which contains a  small 5 mm stone. Aortoiliac calcifications are noted with changes of prior dissection which are stable from the prior study. Diffuse diverticular change of the descending and sigmoid colon is noted without evidence of diverticulitis. The appendix is not well visualized although no inflammatory changes are noted. Mild prominence of the small bowel is noted although no true  obstructive changes are seen. This may represent a mild generalized ileus. The bladder is well distended. No pelvic mass lesion is seen. No acute bony abnormality is noted. IMPRESSION: Nonobstructing left renal calculi stable from the prior exam. Stable dilated distal left ureter containing a small 5 mm stone. No acute obstructive changes are noted. Diverticulosis without diverticulitis. Electronically Signed   By: Inez Catalina M.D.   On: 09/06/2015 20:44   I have personally reviewed and evaluated these images and lab results as part of my medical decision-making.   MDM   Final diagnoses:  Ureteral calculus  Renal insufficiency    Left lower abdominal pain of uncertain cause. Patient reports history of hernia but I see no evidence of hernia on exam. Screening labs are obtained and he is sent for CT of abdomen and pelvis. Studydone without contrast because of renal insufficiency. He was ordered morphine for pain, but refused.  CT shows apparently chronic dilatation of ureter and there is a 5 mm calculus in the distal left ureter. I went to x-ray and this finding to the patient and he stated that he is completely pain-free. I suspect that he had temporary ureteral obstruction from the calculus but it moved to a dependent area of the ureter where it is no longer causing obstruction. Patient relates that in the remote past, he had a ureteral dilatation which left the ureter chronically dilated. I am concerned that this stone may have difficulty passing a both a stoma size and based on the abnormal ureter. He is discharged with a prescription for oxycodone-acetaminophen and tamsulosin and is referred to urology for follow-up.  I, Addis Tuohy, personally performed the services described in this documentation. All medical record entries made by the scribe were at my direction and in my presence.  I have reviewed the chart and discharge instructions and agree that the record reflects my personal performance  and is accurate and complete. Abbee Cremeens.  10/06/3566. 9:32 PM.      [name] Fuel, MD 01/41/03 0131

## 2015-09-06 NOTE — ED Notes (Signed)
Patient has a history of 2 hernia's and reports that on Friday while lifting some bags he had a sharp pain to his left groin. The patient reports that it was fine till this am when it became unbearable.

## 2015-09-06 NOTE — Discharge Instructions (Signed)
Return if pain is not being adequately controlled, or if you start running a fever.  Kidney Stones Kidney stones (urolithiasis) are deposits that form inside your kidneys. The intense pain is caused by the stone moving through the urinary tract. When the stone moves, the ureter goes into spasm around the stone. The stone is usually passed in the urine.  CAUSES   A disorder that makes certain neck glands produce too much parathyroid hormone (primary hyperparathyroidism).  A buildup of uric acid crystals, similar to gout in your joints.  Narrowing (stricture) of the ureter.  A kidney obstruction present at birth (congenital obstruction).  Previous surgery on the kidney or ureters.  Numerous kidney infections. SYMPTOMS   Feeling sick to your stomach (nauseous).  Throwing up (vomiting).  Blood in the urine (hematuria).  Pain that usually spreads (radiates) to the groin.  Frequency or urgency of urination. DIAGNOSIS   Taking a history and physical exam.  Blood or urine tests.  CT scan.  Occasionally, an examination of the inside of the urinary bladder (cystoscopy) is performed. TREATMENT   Observation.  Increasing your fluid intake.  Extracorporeal shock wave lithotripsy--This is a noninvasive procedure that uses shock waves to break up kidney stones.  Surgery may be needed if you have severe pain or persistent obstruction. There are various surgical procedures. Most of the procedures are performed with the use of small instruments. Only small incisions are needed to accommodate these instruments, so recovery time is minimized. The size, location, and chemical composition are all important variables that will determine the proper choice of action for you. Talk to your health care provider to better understand your situation so that you will minimize the risk of injury to yourself and your kidney.  HOME CARE INSTRUCTIONS   Drink enough water and fluids to keep your urine  clear or pale yellow. This will help you to pass the stone or stone fragments.  Strain all urine through the provided strainer. Keep all particulate matter and stones for your health care provider to see. The stone causing the pain may be as small as a grain of salt. It is very important to use the strainer each and every time you pass your urine. The collection of your stone will allow your health care provider to analyze it and verify that a stone has actually passed. The stone analysis will often identify what you can do to reduce the incidence of recurrences.  Only take over-the-counter or prescription medicines for pain, discomfort, or fever as directed by your health care provider.  Keep all follow-up visits as told by your health care provider. This is important.  Get follow-up X-rays if required. The absence of pain does not always mean that the stone has passed. It may have only stopped moving. If the urine remains completely obstructed, it can cause loss of kidney function or even complete destruction of the kidney. It is your responsibility to make sure X-rays and follow-ups are completed. Ultrasounds of the kidney can show blockages and the status of the kidney. Ultrasounds are not associated with any radiation and can be performed easily in a matter of minutes.  Make changes to your daily diet as told by your health care provider. You may be told to:  Limit the amount of salt that you eat.  Eat 5 or more servings of fruits and vegetables each day.  Limit the amount of meat, poultry, fish, and eggs that you eat.  Collect a 24-hour urine sample  as told by your health care provider.You may need to collect another urine sample every 6-12 months. SEEK MEDICAL CARE IF:  You experience pain that is progressive and unresponsive to any pain medicine you have been prescribed. SEEK IMMEDIATE MEDICAL CARE IF:   Pain cannot be controlled with the prescribed medicine.  You have a fever or  shaking chills.  The severity or intensity of pain increases over 18 hours and is not relieved by pain medicine.  You develop a new onset of abdominal pain.  You feel faint or pass out.  You are unable to urinate.   This information is not intended to replace advice given to you by your health care provider. Make sure you discuss any questions you have with your health care provider.   Document Released: 11/06/2005 Document Revised: 07/28/2015 Document Reviewed: 04/09/2013 Elsevier Interactive Patient Education 2016 Elsevier Inc.  Tamsulosin capsules What is this medicine? TAMSULOSIN (tam SOO loe sin) is used to treat enlargement of the prostate gland in men, a condition called benign prostatic hyperplasia or BPH. It is not for use in women. It works by relaxing muscles in the prostate and bladder neck. This improves urine flow and reduces BPH symptoms. This medicine may be used for other purposes; ask your health care provider or pharmacist if you have questions. What should I tell my health care provider before I take this medicine? They need to know if you have any of the following conditions: -advanced kidney disease -advanced liver disease -low blood pressure -prostate cancer -an unusual or allergic reaction to tamsulosin, sulfa drugs, other medicines, foods, dyes, or preservatives -pregnant or trying to get pregnant -breast-feeding How should I use this medicine? Take this medicine by mouth about 30 minutes after the same meal every day. Follow the directions on the prescription label. Swallow the capsules whole with a glass of water. Do not crush, chew, or open capsules. Do not take your medicine more often than directed. Do not stop taking your medicine unless your doctor tells you to. Talk to your pediatrician regarding the use of this medicine in children. Special care may be needed. Overdosage: If you think you have taken too much of this medicine contact a poison control  center or emergency room at once. NOTE: This medicine is only for you. Do not share this medicine with others. What if I miss a dose? If you miss a dose, take it as soon as you can. If it is almost time for your next dose, take only that dose. Do not take double or extra doses. If you stop taking your medicine for several days or more, ask your doctor or health care professional what dose you should start back on. What may interact with this medicine? -cimetidine -fluoxetine -ketoconazole -medicines for erectile disfunction like sildenafil, tadalafil, vardenafil -medicines for high blood pressure -other alpha-blockers like alfuzosin, doxazosin, phentolamine, phenoxybenzamine, prazosin, terazosin -warfarin This list may not describe all possible interactions. Give your health care provider a list of all the medicines, herbs, non-prescription drugs, or dietary supplements you use. Also tell them if you smoke, drink alcohol, or use illegal drugs. Some items may interact with your medicine. What should I watch for while using this medicine? Visit your doctor or health care professional for regular check ups. You will need lab work done before you start this medicine and regularly while you are taking it. Check your blood pressure as directed. Ask your health care professional what your blood pressure should be, and  when you should contact him or her. This medicine may make you feel dizzy or lightheaded. This is more likely to happen after the first dose, after an increase in dose, or during hot weather or exercise. Drinking alcohol and taking some medicines can make this worse. Do not drive, use machinery, or do anything that needs mental alertness until you know how this medicine affects you. Do not sit or stand up quickly. If you begin to feel dizzy, sit down until you feel better. These effects can decrease once your body adjusts to the medicine. Contact your doctor or health care professional right  away if you have an erection that lasts longer than 4 hours or if it becomes painful. This may be a sign of a serious problem and must be treated right away to prevent permanent damage. If you are thinking of having cataract surgery, tell your eye surgeon that you have taken this medicine. What side effects may I notice from receiving this medicine? Side effects that you should report to your doctor or health care professional as soon as possible: -allergic reactions like skin rash or itching, hives, swelling of the lips, mouth, tongue, or throat -breathing problems -change in vision -feeling faint or lightheaded -irregular heartbeat -prolonged or painful erection -weakness Side effects that usually do not require medical attention (report to your doctor or health care professional if they continue or are bothersome): -back pain -change in sex drive or performance -constipation, nausea or vomiting -cough -drowsy -runny or stuffy nose -trouble sleeping This list may not describe all possible side effects. Call your doctor for medical advice about side effects. You may report side effects to FDA at 1-800-FDA-1088. Where should I keep my medicine? Keep out of the reach of children. Store at room temperature between 15 and 30 degrees C (59 and 86 degrees F). Throw away any unused medicine after the expiration date. NOTE: This sheet is a summary. It may not cover all possible information. If you have questions about this medicine, talk to your doctor, pharmacist, or health care provider.    2016, Elsevier/Gold Standard. (2012-11-06 14:11:34)  Acetaminophen; Oxycodone tablets What is this medicine? ACETAMINOPHEN; OXYCODONE (a set a MEE noe fen; ox i KOE done) is a pain reliever. It is used to treat moderate to severe pain. This medicine may be used for other purposes; ask your health care provider or pharmacist if you have questions. What should I tell my health care provider before I take  this medicine? They need to know if you have any of these conditions: -brain tumor -Crohn's disease, inflammatory bowel disease, or ulcerative colitis -drug abuse or addiction -head injury -heart or circulation problems -if you often drink alcohol -kidney disease or problems going to the bathroom -liver disease -lung disease, asthma, or breathing problems -an unusual or allergic reaction to acetaminophen, oxycodone, other opioid analgesics, other medicines, foods, dyes, or preservatives -pregnant or trying to get pregnant -breast-feeding How should I use this medicine? Take this medicine by mouth with a full glass of water. Follow the directions on the prescription label. You can take it with or without food. If it upsets your stomach, take it with food. Take your medicine at regular intervals. Do not take it more often than directed. Talk to your pediatrician regarding the use of this medicine in children. Special care may be needed. Patients over 60 years old may have a stronger reaction and need a smaller dose. Overdosage: If you think you have taken too  much of this medicine contact a poison control center or emergency room at once. NOTE: This medicine is only for you. Do not share this medicine with others. What if I miss a dose? If you miss a dose, take it as soon as you can. If it is almost time for your next dose, take only that dose. Do not take double or extra doses. What may interact with this medicine? -alcohol -antihistamines -barbiturates like amobarbital, butalbital, butabarbital, methohexital, pentobarbital, phenobarbital, thiopental, and secobarbital -benztropine -drugs for bladder problems like solifenacin, trospium, oxybutynin, tolterodine, hyoscyamine, and methscopolamine -drugs for breathing problems like ipratropium and tiotropium -drugs for certain stomach or intestine problems like propantheline, homatropine methylbromide, glycopyrrolate, atropine, belladonna, and  dicyclomine -general anesthetics like etomidate, ketamine, nitrous oxide, propofol, desflurane, enflurane, halothane, isoflurane, and sevoflurane -medicines for depression, anxiety, or psychotic disturbances -medicines for sleep -muscle relaxants -naltrexone -narcotic medicines (opiates) for pain -phenothiazines like perphenazine, thioridazine, chlorpromazine, mesoridazine, fluphenazine, prochlorperazine, promazine, and trifluoperazine -scopolamine -tramadol -trihexyphenidyl This list may not describe all possible interactions. Give your health care provider a list of all the medicines, herbs, non-prescription drugs, or dietary supplements you use. Also tell them if you smoke, drink alcohol, or use illegal drugs. Some items may interact with your medicine. What should I watch for while using this medicine? Tell your doctor or health care professional if your pain does not go away, if it gets worse, or if you have new or a different type of pain. You may develop tolerance to the medicine. Tolerance means that you will need a higher dose of the medication for pain relief. Tolerance is normal and is expected if you take this medicine for a long time. Do not suddenly stop taking your medicine because you may develop a severe reaction. Your body becomes used to the medicine. This does NOT mean you are addicted. Addiction is a behavior related to getting and using a drug for a non-medical reason. If you have pain, you have a medical reason to take pain medicine. Your doctor will tell you how much medicine to take. If your doctor wants you to stop the medicine, the dose will be slowly lowered over time to avoid any side effects. You may get drowsy or dizzy. Do not drive, use machinery, or do anything that needs mental alertness until you know how this medicine affects you. Do not stand or sit up quickly, especially if you are an older patient. This reduces the risk of dizzy or fainting spells. Alcohol may  interfere with the effect of this medicine. Avoid alcoholic drinks. There are different types of narcotic medicines (opiates) for pain. If you take more than one type at the same time, you may have more side effects. Give your health care provider a list of all medicines you use. Your doctor will tell you how much medicine to take. Do not take more medicine than directed. Call emergency for help if you have problems breathing. The medicine will cause constipation. Try to have a bowel movement at least every 2 to 3 days. If you do not have a bowel movement for 3 days, call your doctor or health care professional. Do not take Tylenol (acetaminophen) or medicines that have acetaminophen with this medicine. Too much acetaminophen can be very dangerous. Many nonprescription medicines contain acetaminophen. Always read the labels carefully to avoid taking more acetaminophen. What side effects may I notice from receiving this medicine? Side effects that you should report to your doctor or health care professional as  soon as possible: -allergic reactions like skin rash, itching or hives, swelling of the face, lips, or tongue -breathing difficulties, wheezing -confusion -light headedness or fainting spells -severe stomach pain -unusually weak or tired -yellowing of the skin or the whites of the eyes Side effects that usually do not require medical attention (report to your doctor or health care professional if they continue or are bothersome): -dizziness -drowsiness -nausea -vomiting This list may not describe all possible side effects. Call your doctor for medical advice about side effects. You may report side effects to FDA at 1-800-FDA-1088. Where should I keep my medicine? Keep out of the reach of children. This medicine can be abused. Keep your medicine in a safe place to protect it from theft. Do not share this medicine with anyone. Selling or giving away this medicine is dangerous and against the  law. This medicine may cause accidental overdose and death if it taken by other adults, children, or pets. Mix any unused medicine with a substance like cat litter or coffee grounds. Then throw the medicine away in a sealed container like a sealed bag or a coffee can with a lid. Do not use the medicine after the expiration date. Store at room temperature between 20 and 25 degrees C (68 and 77 degrees F). NOTE: This sheet is a summary. It may not cover all possible information. If you have questions about this medicine, talk to your doctor, pharmacist, or health care provider.    2016, Elsevier/Gold Standard. (2014-10-07 15:18:46)

## 2015-09-09 ENCOUNTER — Inpatient Hospital Stay (HOSPITAL_COMMUNITY): Admission: RE | Admit: 2015-09-09 | Payer: Medicare HMO | Source: Ambulatory Visit

## 2015-09-14 ENCOUNTER — Other Ambulatory Visit: Payer: Self-pay | Admitting: Cardiology

## 2015-09-16 ENCOUNTER — Encounter (HOSPITAL_COMMUNITY)
Admission: RE | Admit: 2015-09-16 | Discharge: 2015-09-16 | Disposition: A | Discharge status: Home / Self Care | Payer: Medicare HMO | Source: Ambulatory Visit | Attending: Surgery | Admitting: Surgery

## 2015-09-16 ENCOUNTER — Encounter (HOSPITAL_COMMUNITY): Payer: Self-pay

## 2015-09-16 HISTORY — DX: Personal history of urinary calculi: Z87.442

## 2015-09-16 HISTORY — DX: Personal history of other diseases of the digestive system: Z87.19

## 2015-09-16 HISTORY — DX: Chronic myeloid leukemia, BCR/ABL-positive, not having achieved remission: C92.10

## 2015-09-16 LAB — CBC
HCT: 36.8 % — ABNORMAL LOW (ref 39.0–52.0)
Hemoglobin: 12.1 g/dL — ABNORMAL LOW (ref 13.0–17.0)
MCH: 32.9 pg (ref 26.0–34.0)
MCHC: 32.9 g/dL (ref 30.0–36.0)
MCV: 100 fL (ref 78.0–100.0)
Platelets: 136 10*3/uL — ABNORMAL LOW (ref 150–400)
RBC: 3.68 MIL/uL — ABNORMAL LOW (ref 4.22–5.81)
RDW: 14.2 % (ref 11.5–15.5)
WBC: 6.3 10*3/uL (ref 4.0–10.5)

## 2015-09-16 LAB — ABO/RH: ABO/RH(D): O POS

## 2015-09-16 LAB — APTT: aPTT: 32 seconds (ref 24–37)

## 2015-09-16 LAB — TYPE AND SCREEN
ABO/RH(D): O POS
Antibody Screen: NEGATIVE

## 2015-09-16 LAB — PROTIME-INR
INR: 1.23 (ref 0.00–1.49)
Prothrombin Time: 15.7 seconds — ABNORMAL HIGH (ref 11.6–15.2)

## 2015-09-16 LAB — SURGICAL PCR SCREEN
MRSA, PCR: NEGATIVE
Staphylococcus aureus: POSITIVE — AB

## 2015-09-16 MED ORDER — CHLORHEXIDINE GLUCONATE CLOTH 2 % EX PADS: 6.0000 | MEDICATED_PAD | Freq: Once | CUTANEOUS | Status: DC

## 2015-09-16 MED ORDER — DEXTROSE 5 % IV SOLN
1.5000 g | INTRAVENOUS | Status: AC
  Administered 2015-09-17: 1.5 g via INTRAVENOUS
  Filled 2015-09-16: qty 1.5

## 2015-09-16 MED ORDER — SODIUM CHLORIDE 0.9 % IV SOLN: INTRAVENOUS | Status: DC

## 2015-09-16 NOTE — Progress Notes (Signed)
Left message on pt's voicemail with positive PCR of staph results. Will start treatment in AM when he arrives for surgery.

## 2015-09-16 NOTE — Pre-Procedure Instructions (Signed)
    [name] [name]  09/16/2015      [id number] - [place], [place] - [name] [place] [address] Phone: [phone] Fax: [phone]    Your procedure is scheduled on Friday, October 28th, 2016.  Report to [facility] Admitting at 6:30 A.M.  Call this number if you have problems the morning of surgery:  [phone]   Remember:  Do not eat food or drink liquids after midnight.  Take these medicines the morning of surgery with A SIP OF WATER: Carvedilol, Oxycodone-acetaminophen (Percocet), Pantoprazole (Protonix).  Follow MD's instructions for Aspirin.    Stop taking: Naproxen, Aleve, Ibuprofen, Advil, Motrin, BC's, Goody's, fish oil, all herbal medications, and all vitamins.     Do not wear jewelry.  Do not wear lotions, powders, or colognes.  You may NOT wear deodorant.   Men may shave face and neck.  Do not bring valuables to the hospital.  [facility] is not responsible for any belongings or valuables.  Contacts, dentures or bridgework may not be worn into surgery.  Leave your suitcase in the car.  After surgery it may be brought to your room.  For patients admitted to the hospital, discharge time will be determined by your treatment team.  Patients discharged the day of surgery will not be allowed to drive home.   Special instructions:  See attached sheets.   Please read over the following fact sheets that you were given. Pain Booklet, Coughing and Deep Breathing, Blood Transfusion Information, MRSA Information and Surgical Site Infection Prevention

## 2015-09-16 NOTE — Progress Notes (Signed)
PCP - Dr. Drake Leach Cardiologist - Dr. Aundra Dubin  EKG - 09/16/2015  CXR - denies  Echo - 06/2015 - Epic Stress test - 07/2013 - epic Cardiac cath - 09/2013 - epic  Patient denies shortness of breath and chest pain at PAT appointment.  Patient states that he was instructed to Aspirin prior to surgery.  Patient states that he normally takes Aspirin later in the day and will take it today, 10/27.

## 2015-09-16 NOTE — Progress Notes (Signed)
Anesthesia Chart Review: Patient is a 81 year old male scheduled for right CEA tomorrow by Dr. Trula Slade. PAT was at 2 PM today.  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  PCP is listed as Dr. Drake Leach. HEM-ONC is Dr. [name] (see Care Everywhere). Cardiologist is Dr. Loralie Champagne who referred patient to [facility] back in 12/2014 for treatment of his carotid artery disease. Patient was initially reluctant to proceed and then he required HEM-ON[facility] referral for leukocytosis and ultimately diagnosed with CML.  Dr. Stephens Shire 08/16/15 note indicates he has received cardiac clearance, as well as, oncology clearance.  Note from 05/07/15 indicates that Dr. Aundra Dubin gave permission to hold Plavix for surgery.   09/16/15 EKG: NSR, non-specific T wave abnormality.  07/19/15 Echo: Study Conclusions - Left ventricle: The cavity size was mildly dilated. Wall thickness was normal. Systolic function was moderately reduced. The estimated ejection fraction was in the range of 35% to 40%. There is akinesis of the mid-apicalanteroseptal and apical myocardium. Doppler parameters are consistent with abnormal left ventricular relaxation (grade 1 diastolic dysfunction). - Aortic valve: There was mild regurgitation. - Mitral valve: Calcified annulus. There was mild regurgitation. - Left atrium: The atrium was mildly dilated. Impressions: - Akinesis of the mid/distal anterior wall and apex; overall moderately reduced LV function; grade 1 diastolic dysfunction; mild LAE; mild AI and MR; trace  TR. (Previous EF 30-35% 12/22/13 and 35-40% 08/10/13.) 06/22/15 note from Dr. Aundra Dubin indicates that he would avoid ICD given age.   10/09/13 LHC:  Coronary angiography: Coronary dominance: left - Left mainstem: Mild luminal irregularities.  - Left anterior descending (LAD): There is a small, high diagonal with 50-60% proximal stenosis. There is a tight 95% proximal LAD stenosis with calcification. He has good flow down the LAD beyond the stenosis.  - Left circumflex (LCx): 30% stenosis proximal LCx, large OM1 with diffuse up to 40% stenosis in the mid-vessel, moderate OM2 with 60-70% proximal stenosis. The LCx provides a left-sided PDA.  - Right coronary artery (RCA): Nondominant small right without significant disease.  - Left ventriculography: Not done, CKD.  Final Conclusions: Tight proximal LAD stenosis is the likely cause of his symptoms and wall motion abnormalities on echo. Discussed with Dr. Tamala Julian, plan intervention today.  Intervention: 1. Successful PCI with overlapping Drug Eluting stents to heavily calcified proximal to mid LAD stenosis form 95% to 0%. 2. 50% eccentric stenosis beyond the distal stent margin, without flow limitation. Unable to pass an 8 mm stent thru the proximal segment to treat. The distal lesion (beyond stents) will likely be treatable after some intimal growth decreases the frictional resistance in the stented segment. This will not be necessary if no symptoms occur. Plan: Aspirin and plavix, indefinite.  12/24/14 Carotid duplex showed 80-99% RICA stenosis, 00-37% LICA stenosis. Normal subclavian arteries bilaterally. Patent vertebrals with antegrade flow. 12/28/14 right carotid duplex at [facility] showed 60-79% RICA stenosis (high end of range). Due to the discrepancy, carotid/neck CTA was done on 02/05/15 and showed severe RICA stenosis at its origin, estimated 90%. No stenotic atheromatous change left ICA origin with moderate disease proximal left ECA estimated 50-75%  stenosis.    Labs from 09/06/15. Cr 1.46,  down from 1.87 on 06/21/15. CBC and PT/PTT from today noted. WBC 6.3. PLT 136K.  If no acute changes then I would anticipate that he could proceed as planned.  George Hugh [facility] Short Stay Center/Anesthesiology Phone [phone] 09/16/2015 3:41 PM

## 2015-09-17 ENCOUNTER — Encounter (HOSPITAL_COMMUNITY): Payer: Self-pay | Admitting: *Deleted

## 2015-09-17 ENCOUNTER — Encounter (HOSPITAL_COMMUNITY)
Admission: RE | Disposition: A | Discharge status: Home / Self Care | Payer: Self-pay | Source: Ambulatory Visit | Attending: Surgery

## 2015-09-17 ENCOUNTER — Inpatient Hospital Stay (HOSPITAL_COMMUNITY): Payer: Medicare HMO | Admitting: Certified Registered"

## 2015-09-17 ENCOUNTER — Inpatient Hospital Stay (HOSPITAL_COMMUNITY)
Admission: RE | Admit: 2015-09-17 | Discharge: 2015-09-18 | DRG: 038 | Disposition: A | Discharge status: Home / Self Care | Payer: Medicare HMO | Source: Ambulatory Visit | Attending: Surgery | Admitting: Surgery

## 2015-09-17 ENCOUNTER — Inpatient Hospital Stay (HOSPITAL_COMMUNITY): Payer: Medicare HMO | Admitting: Vascular Surgery

## 2015-09-17 DIAGNOSIS — Z7902 Long term (current) use of antithrombotics/antiplatelets: Secondary | ICD-10-CM

## 2015-09-17 DIAGNOSIS — I130 Hypertensive heart and chronic kidney disease with heart failure and stage 1 through stage 4 chronic kidney disease, or unspecified chronic kidney disease: Secondary | ICD-10-CM | POA: Diagnosis present

## 2015-09-17 DIAGNOSIS — E785 Hyperlipidemia, unspecified: Secondary | ICD-10-CM | POA: Diagnosis present

## 2015-09-17 DIAGNOSIS — Z87442 Personal history of urinary calculi: Secondary | ICD-10-CM

## 2015-09-17 DIAGNOSIS — Z955 Presence of coronary angioplasty implant and graft: Secondary | ICD-10-CM | POA: Diagnosis not present

## 2015-09-17 DIAGNOSIS — C9210 Chronic myeloid leukemia, BCR/ABL-positive, not having achieved remission: Secondary | ICD-10-CM | POA: Diagnosis present

## 2015-09-17 DIAGNOSIS — Z79899 Other long term (current) drug therapy: Secondary | ICD-10-CM | POA: Diagnosis not present

## 2015-09-17 DIAGNOSIS — I255 Ischemic cardiomyopathy: Secondary | ICD-10-CM | POA: Diagnosis present

## 2015-09-17 DIAGNOSIS — I6521 Occlusion and stenosis of right carotid artery: Principal | ICD-10-CM | POA: Diagnosis present

## 2015-09-17 DIAGNOSIS — N183 Chronic kidney disease, stage 3 (moderate): Secondary | ICD-10-CM | POA: Diagnosis present

## 2015-09-17 DIAGNOSIS — Z87891 Personal history of nicotine dependence: Secondary | ICD-10-CM

## 2015-09-17 DIAGNOSIS — I2510 Atherosclerotic heart disease of native coronary artery without angina pectoris: Secondary | ICD-10-CM | POA: Diagnosis present

## 2015-09-17 DIAGNOSIS — Z951 Presence of aortocoronary bypass graft: Secondary | ICD-10-CM

## 2015-09-17 DIAGNOSIS — I5022 Chronic systolic (congestive) heart failure: Secondary | ICD-10-CM | POA: Diagnosis present

## 2015-09-17 DIAGNOSIS — R339 Retention of urine, unspecified: Secondary | ICD-10-CM | POA: Diagnosis present

## 2015-09-17 DIAGNOSIS — I6529 Occlusion and stenosis of unspecified carotid artery: Secondary | ICD-10-CM | POA: Diagnosis present

## 2015-09-17 DIAGNOSIS — Z7982 Long term (current) use of aspirin: Secondary | ICD-10-CM | POA: Diagnosis not present

## 2015-09-17 DIAGNOSIS — R0989 Other specified symptoms and signs involving the circulatory and respiratory systems: Secondary | ICD-10-CM | POA: Diagnosis present

## 2015-09-17 HISTORY — PX: ENDARTERECTOMY: SHX5162

## 2015-09-17 LAB — CBC
HCT: 34.8 % — ABNORMAL LOW (ref 39.0–52.0)
Hemoglobin: 11.9 g/dL — ABNORMAL LOW (ref 13.0–17.0)
MCH: 33.1 pg (ref 26.0–34.0)
MCHC: 34.2 g/dL (ref 30.0–36.0)
MCV: 96.9 fL (ref 78.0–100.0)
Platelets: 121 10*3/uL — ABNORMAL LOW (ref 150–400)
RBC: 3.59 MIL/uL — ABNORMAL LOW (ref 4.22–5.81)
RDW: 13.9 % (ref 11.5–15.5)
WBC: 7.5 10*3/uL (ref 4.0–10.5)

## 2015-09-17 LAB — CREATININE, SERUM
Creatinine, Ser: 1.35 mg/dL — ABNORMAL HIGH (ref 0.61–1.24)
GFR calc Af Amer: 55 mL/min — ABNORMAL LOW (ref >60)
GFR calc non Af Amer: 47 mL/min — ABNORMAL LOW (ref >60)

## 2015-09-17 SURGERY — ENDARTERECTOMY, CAROTID
Anesthesia: General | Site: Neck | Laterality: Right

## 2015-09-17 MED ORDER — EPHEDRINE SULFATE 50 MG/ML IJ SOLN
INTRAMUSCULAR | Status: DC | PRN
  Administered 2015-09-17 (×2): 15 mg via INTRAVENOUS

## 2015-09-17 MED ORDER — PROTAMINE SULFATE 10 MG/ML IV SOLN
INTRAVENOUS | Status: DC | PRN
  Administered 2015-09-17: 50 mg via INTRAVENOUS

## 2015-09-17 MED ORDER — PHENYLEPHRINE HCL 10 MG/ML IJ SOLN
INTRAMUSCULAR | Status: AC
  Filled 2015-09-17: qty 1

## 2015-09-17 MED ORDER — DIPHENHYDRAMINE HCL 25 MG PO CAPS: 25.0000 mg | ORAL_CAPSULE | Freq: Four times a day (QID) | ORAL | Status: DC | PRN

## 2015-09-17 MED ORDER — ASPIRIN EC 81 MG PO TBEC
81.0000 mg | DELAYED_RELEASE_TABLET | Freq: Every day | ORAL | Status: DC
  Filled 2015-09-17: qty 1

## 2015-09-17 MED ORDER — REMIFENTANIL HCL 1 MG IV SOLR
INTRAVENOUS | Status: DC | PRN
  Administered 2015-09-17: .15 ug/kg/min via INTRAVENOUS

## 2015-09-17 MED ORDER — EPHEDRINE SULFATE 50 MG/ML IJ SOLN
INTRAMUSCULAR | Status: AC
  Filled 2015-09-17: qty 1

## 2015-09-17 MED ORDER — PHENYLEPHRINE HCL 10 MG/ML IJ SOLN: 10.0000 mg | INTRAVENOUS | Status: DC | PRN

## 2015-09-17 MED ORDER — ONDANSETRON HCL 4 MG/2ML IJ SOLN
INTRAMUSCULAR | Status: DC | PRN
  Administered 2015-09-17: 8 mg via INTRAVENOUS

## 2015-09-17 MED ORDER — DOCUSATE SODIUM 100 MG PO CAPS: 100.0000 mg | ORAL_CAPSULE | Freq: Every day | ORAL | Status: DC

## 2015-09-17 MED ORDER — STERILE WATER FOR INJECTION IJ SOLN
INTRAMUSCULAR | Status: AC
  Filled 2015-09-17: qty 10

## 2015-09-17 MED ORDER — POTASSIUM CHLORIDE CRYS ER 20 MEQ PO TBCR: 20.0000 meq | EXTENDED_RELEASE_TABLET | Freq: Every day | ORAL | Status: DC | PRN

## 2015-09-17 MED ORDER — BISACODYL 5 MG PO TBEC: 5.0000 mg | DELAYED_RELEASE_TABLET | Freq: Every day | ORAL | Status: DC | PRN

## 2015-09-17 MED ORDER — TAMSULOSIN HCL 0.4 MG PO CAPS
0.4000 mg | ORAL_CAPSULE | Freq: Every day | ORAL | Status: DC
  Administered 2015-09-17: 0.4 mg via ORAL
  Filled 2015-09-17 (×2): qty 1

## 2015-09-17 MED ORDER — ALUM & MAG HYDROXIDE-SIMETH 200-200-20 MG/5ML PO SUSP
15.0000 mL | ORAL | Status: DC | PRN
Start: 2015-09-17 — End: 2015-09-18

## 2015-09-17 MED ORDER — MAGNESIUM 250 MG PO TABS: 1.0000 | ORAL_TABLET | Freq: Every day | ORAL | Status: DC

## 2015-09-17 MED ORDER — SENNOSIDES-DOCUSATE SODIUM 8.6-50 MG PO TABS
1.0000 | ORAL_TABLET | Freq: Every evening | ORAL | Status: DC | PRN
  Filled 2015-09-17: qty 1

## 2015-09-17 MED ORDER — HEMOSTATIC AGENTS (NO CHARGE) OPTIME
TOPICAL | Status: DC | PRN
  Administered 2015-09-17: 1 via TOPICAL

## 2015-09-17 MED ORDER — ACETAMINOPHEN 650 MG RE SUPP: 325.0000 mg | RECTAL | Status: DC | PRN

## 2015-09-17 MED ORDER — PHENOL 1.4 % MT LIQD: 1.0000 | OROMUCOSAL | Status: DC | PRN

## 2015-09-17 MED ORDER — MORPHINE SULFATE (PF) 2 MG/ML IV SOLN: 2.0000 mg | INTRAVENOUS | Status: DC | PRN

## 2015-09-17 MED ORDER — MAGNESIUM SULFATE 2 GM/50ML IV SOLN
2.0000 g | Freq: Every day | INTRAVENOUS | Status: DC | PRN
Start: 2015-09-17 — End: 2015-09-18

## 2015-09-17 MED ORDER — HYDROMORPHONE HCL 1 MG/ML IJ SOLN
INTRAMUSCULAR | Status: AC
  Administered 2015-09-17: 0.25 mg via INTRAVENOUS
  Filled 2015-09-17: qty 1

## 2015-09-17 MED ORDER — SUCCINYLCHOLINE CHLORIDE 20 MG/ML IJ SOLN
INTRAMUSCULAR | Status: AC
  Filled 2015-09-17: qty 1

## 2015-09-17 MED ORDER — REMIFENTANIL HCL 2 MG IV SOLR: 0.5000 ug/kg | Freq: Once | INTRAVENOUS | Status: DC

## 2015-09-17 MED ORDER — MAGNESIUM OXIDE 400 (241.3 MG) MG PO TABS
200.0000 mg | ORAL_TABLET | Freq: Every day | ORAL | Status: DC
  Filled 2015-09-17: qty 0.5

## 2015-09-17 MED ORDER — ONDANSETRON HCL 4 MG/2ML IJ SOLN
INTRAMUSCULAR | Status: AC
  Filled 2015-09-17: qty 2

## 2015-09-17 MED ORDER — 0.9 % SODIUM CHLORIDE (POUR BTL) OPTIME
TOPICAL | Status: DC | PRN
  Administered 2015-09-17: 2000 mL

## 2015-09-17 MED ORDER — REMIFENTANIL HCL 1 MG IV SOLR
INTRAVENOUS | Status: DC | PRN
  Administered 2015-09-17: 100 ug via INTRAVENOUS

## 2015-09-17 MED ORDER — METOPROLOL TARTRATE 1 MG/ML IV SOLN: 2.0000 mg | INTRAVENOUS | Status: DC | PRN

## 2015-09-17 MED ORDER — HEPARIN SODIUM (PORCINE) 1000 UNIT/ML IJ SOLN
INTRAMUSCULAR | Status: DC | PRN
  Administered 2015-09-17: 7000 [IU] via INTRAVENOUS

## 2015-09-17 MED ORDER — LIDOCAINE HCL (CARDIAC) 20 MG/ML IV SOLN
INTRAVENOUS | Status: DC | PRN
  Administered 2015-09-17: 50 mg via INTRAVENOUS

## 2015-09-17 MED ORDER — ACETAMINOPHEN 325 MG PO TABS: 325.0000 mg | ORAL_TABLET | ORAL | Status: DC | PRN

## 2015-09-17 MED ORDER — PROTAMINE SULFATE 10 MG/ML IV SOLN
INTRAVENOUS | Status: AC
  Filled 2015-09-17: qty 5

## 2015-09-17 MED ORDER — HEPARIN SODIUM (PORCINE) 1000 UNIT/ML IJ SOLN
INTRAMUSCULAR | Status: AC
  Filled 2015-09-17: qty 1

## 2015-09-17 MED ORDER — DEXAMETHASONE SODIUM PHOSPHATE 10 MG/ML IJ SOLN
INTRAMUSCULAR | Status: AC
  Filled 2015-09-17: qty 1

## 2015-09-17 MED ORDER — FUROSEMIDE 20 MG PO TABS
20.0000 mg | ORAL_TABLET | ORAL | Status: DC
  Filled 2015-09-17: qty 1

## 2015-09-17 MED ORDER — LIDOCAINE HCL (CARDIAC) 20 MG/ML IV SOLN
INTRAVENOUS | Status: AC
  Filled 2015-09-17: qty 5

## 2015-09-17 MED ORDER — SPIRONOLACTONE 12.5 MG HALF TABLET
12.5000 mg | ORAL_TABLET | Freq: Every day | ORAL | Status: DC
  Administered 2015-09-17: 12.5 mg via ORAL
  Filled 2015-09-17 (×2): qty 1

## 2015-09-17 MED ORDER — LABETALOL HCL 5 MG/ML IV SOLN: 10.0000 mg | INTRAVENOUS | Status: DC | PRN

## 2015-09-17 MED ORDER — DEXTROSE 5 % IV SOLN
1.5000 g | Freq: Two times a day (BID) | INTRAVENOUS | Status: DC
  Administered 2015-09-17: 1.5 g via INTRAVENOUS
  Filled 2015-09-17 (×2): qty 1.5

## 2015-09-17 MED ORDER — ONDANSETRON HCL 4 MG/2ML IJ SOLN: 4.0000 mg | Freq: Four times a day (QID) | INTRAMUSCULAR | Status: DC | PRN

## 2015-09-17 MED ORDER — LOSARTAN POTASSIUM 25 MG PO TABS
12.5000 mg | ORAL_TABLET | Freq: Every day | ORAL | Status: DC
  Filled 2015-09-17 (×2): qty 0.5

## 2015-09-17 MED ORDER — REMIFENTANIL HCL 2 MG IV SOLR
0.5000 ug/kg | INTRAVENOUS | Status: AC
  Administered 2015-09-17: 1000 ug via INTRAVENOUS

## 2015-09-17 MED ORDER — ATORVASTATIN CALCIUM 40 MG PO TABS
40.0000 mg | ORAL_TABLET | Freq: Every day | ORAL | Status: DC
  Filled 2015-09-17 (×2): qty 1

## 2015-09-17 MED ORDER — GLYCOPYRROLATE 0.2 MG/ML IJ SOLN
INTRAMUSCULAR | Status: AC
  Filled 2015-09-17: qty 1

## 2015-09-17 MED ORDER — OXYCODONE-ACETAMINOPHEN 5-325 MG PO TABS: 1.0000 | ORAL_TABLET | ORAL | Status: DC | PRN

## 2015-09-17 MED ORDER — LIDOCAINE HCL (CARDIAC) 20 MG/ML IV SOLN: INTRAVENOUS | Status: DC | PRN

## 2015-09-17 MED ORDER — HYDROCODONE-ACETAMINOPHEN 5-325 MG PO TABS: 1.0000 | ORAL_TABLET | ORAL | Status: DC | PRN

## 2015-09-17 MED ORDER — ALBUMIN HUMAN 5 % IV SOLN
INTRAVENOUS | Status: DC | PRN
  Administered 2015-09-17: 10:00:00 via INTRAVENOUS

## 2015-09-17 MED ORDER — ENOXAPARIN SODIUM 40 MG/0.4ML ~~LOC~~ SOLN
40.0000 mg | SUBCUTANEOUS | Status: DC
  Filled 2015-09-17 (×2): qty 0.4

## 2015-09-17 MED ORDER — SODIUM CHLORIDE 0.9 % IV SOLN
INTRAVENOUS | Status: DC
  Administered 2015-09-17: 20:00:00 via INTRAVENOUS

## 2015-09-17 MED ORDER — PROMETHAZINE HCL 25 MG/ML IJ SOLN: 6.2500 mg | INTRAMUSCULAR | Status: DC | PRN

## 2015-09-17 MED ORDER — LACTATED RINGERS IV SOLN
INTRAVENOUS | Status: DC
Start: 2015-09-17 — End: 2015-09-18
  Administered 2015-09-17 (×2): via INTRAVENOUS

## 2015-09-17 MED ORDER — SODIUM CHLORIDE 0.9 % IV SOLN: 500.0000 mL | Freq: Once | INTRAVENOUS | Status: DC | PRN

## 2015-09-17 MED ORDER — PROPOFOL 10 MG/ML IV BOLUS
INTRAVENOUS | Status: AC
  Filled 2015-09-17: qty 20

## 2015-09-17 MED ORDER — DEXAMETHASONE SODIUM PHOSPHATE 10 MG/ML IJ SOLN
INTRAMUSCULAR | Status: DC | PRN
  Administered 2015-09-17: 10 mg via INTRAVENOUS

## 2015-09-17 MED ORDER — HYDROMORPHONE HCL 1 MG/ML IJ SOLN
0.2500 mg | INTRAMUSCULAR | Status: DC | PRN
  Administered 2015-09-17 (×2): 0.25 mg via INTRAVENOUS

## 2015-09-17 MED ORDER — THROMBIN 20000 UNITS EX SOLR
CUTANEOUS | Status: AC
  Filled 2015-09-17: qty 20000

## 2015-09-17 MED ORDER — SUCCINYLCHOLINE CHLORIDE 20 MG/ML IJ SOLN: INTRAMUSCULAR | Status: DC | PRN

## 2015-09-17 MED ORDER — PANTOPRAZOLE SODIUM 40 MG PO TBEC: 40.0000 mg | DELAYED_RELEASE_TABLET | Freq: Every day | ORAL | Status: DC

## 2015-09-17 MED ORDER — PHENYLEPHRINE HCL 10 MG/ML IJ SOLN
10.0000 mg | INTRAVENOUS | Status: DC | PRN
  Administered 2015-09-17: 50 ug/min via INTRAVENOUS

## 2015-09-17 MED ORDER — SODIUM CHLORIDE 0.9 % IV SOLN
INTRAVENOUS | Status: DC | PRN
  Administered 2015-09-17: 500 mL

## 2015-09-17 MED ORDER — HYDRALAZINE HCL 20 MG/ML IJ SOLN: 5.0000 mg | INTRAMUSCULAR | Status: DC | PRN

## 2015-09-17 MED ORDER — LIDOCAINE HCL (PF) 1 % IJ SOLN
INTRAMUSCULAR | Status: AC
  Filled 2015-09-17: qty 30

## 2015-09-17 MED ORDER — POTASSIUM GLUCONATE 595 MG PO CAPS: 1.0000 | ORAL_CAPSULE | Freq: Every day | ORAL | Status: DC

## 2015-09-17 MED ORDER — GLYCOPYRROLATE 0.2 MG/ML IJ SOLN
INTRAMUSCULAR | Status: DC | PRN
  Administered 2015-09-17: 0.2 mg via INTRAVENOUS

## 2015-09-17 MED ORDER — GUAIFENESIN-DM 100-10 MG/5ML PO SYRP: 15.0000 mL | ORAL_SOLUTION | ORAL | Status: DC | PRN

## 2015-09-17 MED ORDER — LOSARTAN POTASSIUM 25 MG PO TABS: 12.5000 mg | ORAL_TABLET | Freq: Every day | ORAL | Status: DC

## 2015-09-17 MED ORDER — SUCCINYLCHOLINE CHLORIDE 20 MG/ML IJ SOLN
INTRAMUSCULAR | Status: DC | PRN
  Administered 2015-09-17: 70 mg via INTRAVENOUS

## 2015-09-17 MED ORDER — CARVEDILOL 6.25 MG PO TABS
6.2500 mg | ORAL_TABLET | Freq: Two times a day (BID) | ORAL | Status: DC
  Administered 2015-09-17: 6.25 mg via ORAL
  Filled 2015-09-17 (×4): qty 1

## 2015-09-17 MED ORDER — MUPIROCIN 2 % EX OINT
1.0000 "application " | TOPICAL_OINTMENT | Freq: Once | CUTANEOUS | Status: AC
  Administered 2015-09-17: 1 via TOPICAL
  Filled 2015-09-17: qty 22

## 2015-09-17 SURGICAL SUPPLY — 47 items
CANISTER SUCTION 2500CC (MISCELLANEOUS) ×2 IMPLANT
CATH ROBINSON RED A/P 18FR (CATHETERS) ×2 IMPLANT
CATH SUCT 10FR WHISTLE TIP (CATHETERS) ×2 IMPLANT
CLIP TI MEDIUM 6 (CLIP) ×2 IMPLANT
CLIP TI WIDE RED SMALL 6 (CLIP) ×4 IMPLANT
CRADLE DONUT ADULT HEAD (MISCELLANEOUS) ×2 IMPLANT
DRAIN CHANNEL 15F RND FF W/TCR (WOUND CARE) IMPLANT
ELECT REM PT RETURN 9FT ADLT (ELECTROSURGICAL) ×2
ELECTRODE REM PT RTRN 9FT ADLT (ELECTROSURGICAL) ×1 IMPLANT
EVACUATOR SILICONE 100CC (DRAIN) IMPLANT
GAUZE SPONGE 4X4 12PLY STRL (GAUZE/BANDAGES/DRESSINGS) ×2 IMPLANT
GLOVE BIO SURGEON STRL SZ 6.5 (GLOVE) ×4 IMPLANT
GLOVE BIOGEL PI IND STRL 6.5 (GLOVE) ×3 IMPLANT
GLOVE BIOGEL PI IND STRL 7.5 (GLOVE) ×2 IMPLANT
GLOVE BIOGEL PI INDICATOR 6.5 (GLOVE) ×3
GLOVE BIOGEL PI INDICATOR 7.5 (GLOVE) ×2
GLOVE ECLIPSE 7.0 STRL STRAW (GLOVE) ×2 IMPLANT
GLOVE SURG SS PI 7.5 STRL IVOR (GLOVE) ×2 IMPLANT
GOWN STRL REUS W/ TWL LRG LVL3 (GOWN DISPOSABLE) ×2 IMPLANT
GOWN STRL REUS W/ TWL XL LVL3 (GOWN DISPOSABLE) ×1 IMPLANT
GOWN STRL REUS W/TWL LRG LVL3 (GOWN DISPOSABLE) ×2
GOWN STRL REUS W/TWL XL LVL3 (GOWN DISPOSABLE) ×1
HEMOSTAT SNOW SURGICEL 2X4 (HEMOSTASIS) ×2 IMPLANT
INSERT FOGARTY SM (MISCELLANEOUS) IMPLANT
KIT BASIN OR (CUSTOM PROCEDURE TRAY) ×2 IMPLANT
KIT ROOM TURNOVER OR (KITS) ×2 IMPLANT
LIQUID BAND (GAUZE/BANDAGES/DRESSINGS) ×2 IMPLANT
NEEDLE HYPO 25GX1X1/2 BEV (NEEDLE) IMPLANT
NS IRRIG 1000ML POUR BTL (IV SOLUTION) ×6 IMPLANT
PACK CAROTID (CUSTOM PROCEDURE TRAY) ×2 IMPLANT
PAD ARMBOARD 7.5X6 YLW CONV (MISCELLANEOUS) ×4 IMPLANT
PATCH VASC XENOSURE 1CMX6CM (Vascular Products) ×1 IMPLANT
PATCH VASC XENOSURE 1X6 (Vascular Products) ×1 IMPLANT
SHUNT CAROTID BYPASS 10 (VASCULAR PRODUCTS) IMPLANT
SHUNT CAROTID BYPASS 12FRX15.5 (VASCULAR PRODUCTS) IMPLANT
SPONGE INTESTINAL PEANUT (DISPOSABLE) ×2 IMPLANT
SUT ETHILON 3 0 PS 1 (SUTURE) IMPLANT
SUT PROLENE 6 0 BV (SUTURE) ×8 IMPLANT
SUT PROLENE 7 0 BV 1 (SUTURE) IMPLANT
SUT PROLENE 7 0 BV1 MDA (SUTURE) ×2 IMPLANT
SUT SILK 3 0 TIES 17X18 (SUTURE)
SUT SILK 3-0 18XBRD TIE BLK (SUTURE) IMPLANT
SUT VIC AB 3-0 SH 27 (SUTURE) ×2
SUT VIC AB 3-0 SH 27X BRD (SUTURE) ×2 IMPLANT
SUT VICRYL 4-0 PS2 18IN ABS (SUTURE) ×2 IMPLANT
SYR CONTROL 10ML LL (SYRINGE) IMPLANT
WATER STERILE IRR 1000ML POUR (IV SOLUTION) ×2 IMPLANT

## 2015-09-17 NOTE — Anesthesia Procedure Notes (Signed)
Procedure Name: Intubation Date/Time: 09/17/2015 8:38 AM Performed by: Duke Salvia Pre-anesthesia Checklist: Patient identified, Emergency Drugs available, Suction available, Patient being monitored and Timeout performed Patient Re-evaluated:Patient Re-evaluated prior to inductionOxygen Delivery Method: Circle system utilized and Simple face mask Preoxygenation: Pre-oxygenation with 100% oxygen Intubation Type: IV induction Laryngoscope Size: Mac and 4 Grade View: Grade II Tube type: Oral Tube size: 7.5 mm Number of attempts: 1 Placement Confirmation: ETT inserted through vocal cords under direct vision,  positive ETCO2 and breath sounds checked- equal and bilateral Secured at: 22 cm Tube secured with: Tape Dental Injury: Teeth and Oropharynx as per pre-operative assessment

## 2015-09-17 NOTE — Progress Notes (Signed)
Pt refused all scheduled medications including hypertensive due to "hospital charges" that " eachl pill costs  more than the whole bottle" if acquired outside from a pharmacy. Family including spouse at the bedside and agreed with him. Education provided regarding not skip blood pressure medications. Verbalized understanding.

## 2015-09-17 NOTE — H&P (Signed)
Expand All Collapse All       Patient name: [name] MooreMRN: [id number] DOB: 09/22/1931Sex: male    Chief Complaint  Patient presents with  . Re-evaluation    OV to discuss CEA    HISTORY OF PRESENT ILLNESS: The patient was initially referred to me several months ago for carotid stenosis. This was detected by hearing a bruit which led to an ultrasound. He has an outside CT scan that shows approximately 90% right carotid stenosis. I scheduled him for right carotid endarterectomy which he delayed. I needed to have clearance from cardiology which I have obtained. 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 He takes a statin for hypercholesterolemia. He takes an ARB for hypertension.  In September 2014, he was hospitalized with recurrent abdominal pain from SBO. It was managed conservatively with NGT and IVF. He developed pulmonary edema and and was intubated. Echo revealed 35-40%. He was diuresed and extubated. At that time, his creatinine was slightly elevated at 1.6 and was taken to the cath lab at a later date (Novemeber 2014) and was found to have a mid LAD stenosis and had a DES placed. He is on Plavix for this.   He states that he does have some pain in his feet at rest. He tells me that he had a herniated disk in the past and has had trouble with numbness and tingling in his legs. He states that after he underwent a cholecystectomy, he did not have any back pain after that.    Past Medical History  Diagnosis Date  . Small bowel perforation     a. 11/2012 s/p emergent SB resection.  . Diverticulitis   . CKD (chronic kidney disease), stage III   . Coronary artery disease     a. 09/2013 Cath: LM min irregs, LAD 95p(2.75x20 Promus Premier DES & 3.0x20 Promus Premier DES overlapping), D1 50-60, LCX 30p, OM1 83m, OM2 60-70p, RCA small, nl.  . Ischemic cardiomyopathy      a. 07/2013 Echo: EF 35-40%, apical septal, apical lat, apical AK, mod MR.  . Chronic systolic CHF (congestive heart failure)     a. 07/2013 Echo: EF 35-40%  . Hyperlipidemia   . Carotid artery occlusion     Past Surgical History  Procedure Laterality Date  . Laparoscopic cholecystectomy    . Inguinal hernia repair    . Cardiac catheterization  10/09/2013  . Coronary angioplasty  10/09/2013    MID LAD  . Colon surgery  11/2012    BOWEL SURGERY   . Left heart catheterization with coronary angiogram N/A 10/09/2013    Procedure: LEFT HEART CATHETERIZATION WITH CORONARY ANGIOGRAM; Surgeon: Larey Dresser, MD; Location: [facility] CATH LAB; Service: Cardiovascular; Laterality: N/A;  . Percutaneous coronary stent intervention (pci-s)  10/09/2013    Procedure: PERCUTANEOUS CORONARY STENT INTERVENTION (PCI-S); Surgeon: Larey Dresser, MD; Location[facility] C[facility] LAB; Service: Cardiovascular;;    Social History   Social History  . Marital Status: Married    Spouse Name: N/A  . Number of Children: N/A  . Years of Education: N/A   Occupational History  . Not on file.   Social History Main Topics  . Smoking status: Former Smoker -- 1.00 packs/day for 45 years    Types: Cigarettes    Quit date: 06/09/1999  . Smokeless tobacco: Never Used  . Alcohol Use: No  . Drug Use: No  . Sexual Activity: Not Currently   Other Topics Concern  .  Not on file   Social History Narrative   Pt lives in [place] with his wife of 14 yrs (second wife). 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 Very active @ home. Still push mows his yard.    Family History  Problem Relation Age of Onset  . Emphysema Father     died @ 43  . Diabetes Mother     died @ 13    Allergies as of 2015-09-07  . (No Known Allergies)    Current  Outpatient Prescriptions on File Prior to Visit  Medication Sig Dispense Refill  . Ascorbic Acid (VITAMIN C PO) Take 1,000 mg by mouth daily.     Marland Kitchen aspirin EC 81 MG tablet Take 1 tablet (81 mg total) by mouth daily. 90 tablet 3  . atorvastatin (LIPITOR) 40 MG tablet TAKE 1 TABLET BY MOUTH DAILY AT 6 PM 30 tablet 5  . carvedilol (COREG) 12.5 MG tablet Take 0.5 tablets (6.25 mg total) by mouth 2 (two) times daily with a meal. 30 tablet 3  . Coenzyme Q10 (COQ10) 200 MG CAPS Take 1 capsule by mouth daily.    . Cyanocobalamin (VITAMIN B 12 PO) Take by mouth daily.    . furosemide (LASIX) 20 MG tablet Take 1 tablet (20 mg total) by mouth every other day. Take an additional 20mg  daily as needed for swelling. 45 tablet 0  . furosemide (LASIX) 20 MG tablet TAKE 1 TABLET BY MOUTH DAILY. TAKE AN ADDITIONAL TABLET DAILY AS NEEDED 45 tablet 0  . Krill Oil 300 MG CAPS Take 1 capsule by mouth daily.    Marland Kitchen losartan (COZAAR) 25 MG tablet TAKE 1/2 TABLET BY MOUTH DAILY 30 tablet 0  . Magnesium 250 MG TABS Take 1 tablet by mouth daily.    Marland Kitchen NIACIN PO Take 1 tablet by mouth daily.    Marland Kitchen spironolactone (ALDACTONE) 25 MG tablet TAKE 1/2 TABLET BY MOUTH EVERY DAY 15 tablet 11  . Thiamine HCl (VITAMIN B-1 PO) Take 1 tablet by mouth daily.    . vitamin A 8000 UNIT capsule Take 8,000 Units by mouth daily.    Marland Kitchen VITAMIN E PO Take 1 tablet by mouth daily.    . pantoprazole (PROTONIX) 40 MG tablet TAKE 1 TABLET BY MOUTH DAILY (Patient not taking: Reported on 2015-09-07) 30 tablet 1   Current Facility-Administered Medications on File Prior to Visit  Medication Dose Route Frequency Provider Last Rate Last Dose  . midazolam (VERSED) 5 MG/5ML injection   Anesthesia Intra-op Janine Ores, CRNA  2 mg at 08/09/13 1146  . propofol (DIPRIVAN) 10 mg/mL bolus/IV push   Anesthesia Intra-op Janine Ores, CRNA  20  mg at 08/09/13 1147     REVIEW OF SYSTEMS: Cardiovascular: No chest pain, chest pressure, palpitations, orthopnea, or dyspnea on exertion. No claudication or rest pain, No history of DVT or phlebitis. Positive for foot pain at rest Pulmonary: No productive cough, asthma or wheezing. Neurologic: No weakness, paresthesias, aphasia, or amaurosis. No dizziness. Hematologic: No bleeding problems or clotting disorders. Musculoskeletal: No joint pain or joint swelling. Gastrointestinal: No blood in stool or hematemesis Genitourinary: No dysuria or hematuria. Psychiatric:: No history of major depression. Integumentary: No rashes or ulcers. Constitutional: No fever or chills.  PHYSICAL EXAMINATION:  Vital signs are  Filed Vitals:   Sep 07, 2015 1435 Sep 07, 2015 1436  BP: 132/60 125/59  Pulse: 59   Height: 5\' 9"  (1.753 m)   Weight: 150 lb (68.04 kg)  SpO2: 100%    Body mass index is 22.14 kg/(m^2). General: The patient appears their stated age. HEENT: No gross abnormalities Pulmonary: Non labored breathing Abdomen: Soft and non-tender Musculoskeletal: There are no major deformities. Neurologic: No focal weakness or paresthesias are detected, Skin: There are no ulcer or rashes noted. Psychiatric: The patient has normal affect. Cardiovascular: There is a regular rate and rhythm without significant murmur appreciated. Positive carotid bruit   Diagnostic Studies outside CT scan shows 90% right carotid stenosis.  Assessment: asymptomatic right carotid stenosis Plan: the patient initially had conflicting ultrasound studies about the degree of stenosis. A CT scan was performed to define his degree of stenosis which confirmed 90%. He remained asymptomatic. I have obtained cardiac clearance to proceed. Per the patient, he is no longer taking Plavix. This is not on his most recent medication record. I would like to get formal clearance from his oncologist. I am  unsure of the surgical risks with Glivac , or if this needs to be Discontinued in advance of his operation. Once this is been completed, we will proceed with right carotid endarterectomy. I discussed the risks and benefits the operation with the patient's family including his wife and daughter. We discussed the risk of stroke, nerve injury, and bleeding , as well as facial numbness. He understands this and once to proceed at this time.  Eldridge Abrahams, M.D. [facility] Office: [phone] Pager: [phone]         No changes form most recent clinic visit Recent kidney stone.  I don't feel that is a reason to cancel, but left it up to the patient. No neurologic symptoms CV: RRR PULM:  CTA Neuro: intact  Plan Right CEA   [name] [name]

## 2015-09-17 NOTE — Anesthesia Preprocedure Evaluation (Addendum)
Anesthesia Evaluation  Patient identified by MRN, date of birth, ID band Patient awake    Reviewed: Allergy & Precautions, NPO status , Patient's Chart, lab work & pertinent test results  Airway Mallampati: II  TM Distance: >3 FB Neck ROM: full    Dental  (+) Teeth Intact, Missing, Dental Advidsory Given   Pulmonary neg pulmonary ROS, former smoker,    breath sounds clear to auscultation       Cardiovascular Exercise Tolerance: Good METS: 3 - Mets hypertension, Pt. on medications and Pt. on home beta blockers + angina + CAD, + Cardiac Stents, + CABG, + Peripheral Vascular Disease and +CHF  II Rhythm:regular Rate:Normal  07/19/15 Echo: Study Conclusions - Left ventricle: The cavity size was mildly dilated. Wall thickness was normal. Systolic function was moderately reduced. The estimated ejection fraction was in the range of 35% to 40%. There is akinesis of the mid-apicalanteroseptal and apical myocardium. Doppler parameters are consistent with abnormal left ventricular relaxation (grade 1 diastolic dysfunction). - Aortic valve: There was mild regurgitation. - Mitral valve: Calcified annulus. There was mild regurgitation. - Left atrium: The atrium was mildly dilated. Impressions: - Akinesis of the mid/distal anterior wall and apex; overall moderately reduced LV function; grade 1 diastolic dysfunction; mild LAE; mild AI and MR; trace TR. (Previous EF 30-35% 12/22/13 and 35-40% 08/10/13.) 06/22/15 note from Dr. Aundra Dubin indicates that he would avoid ICD given age.    Neuro/Psych negative neurological ROS  negative psych ROS   GI/Hepatic Neg liver ROS,   Endo/Other  negative endocrine ROS  Renal/GU Renal InsufficiencyRenal disease     Musculoskeletal negative musculoskeletal ROS (+)   Abdominal   Peds  Hematology negative hematology ROS (+) CML   Anesthesia Other Findings   Reproductive/Obstetrics negative  OB ROS                           Anesthesia Physical Anesthesia Plan  ASA: III  Anesthesia Plan: General   Post-op Pain Management:    Induction: Intravenous  Airway Management Planned: Oral ETT  Additional Equipment: Arterial line  Intra-op Plan:   Post-operative Plan: Extubation in OR  Informed Consent:   Plan Discussed with:   Anesthesia Plan Comments:         Anesthesia Quick Evaluation

## 2015-09-17 NOTE — Op Note (Signed)
Patient name: [name] [name] MRN: [id number] DOB: 03-10-1930 Sex: male  09/17/2015 Pre-operative Diagnosis: Asymptomatic   right carotid stenosis Post-operative diagnosis:  Same Surgeon:  Annamarie Major Assistants:  Lennie Muckle Procedure:    right carotid Endarterectomy with bovine pericardial  patch angioplasty Anesthesia:  General Blood Loss:  See anesthesia record Specimens:  Carotid Plaque to pathology  Findings:  85 %stenosis; Thrombus:  none  Indications:  81 year old with 90% asymptomatic right carotid stenosis by CTA scan  Procedure:  The patient was identified in the holding area and taken to Lead 12  The patient was then placed supine on the table.   General endotrachial anesthesia was administered.  The patient was prepped and draped in the usual sterile fashion.  A time out was called and antibiotics were administered.  The incision was made along the anterior border of the right sternocleidomastoid muscle.  Cautery was used to dissect through the subcutaneous tissue.  The platysma muscle was divided with cautery.  The internal jugular vein was exposed along its anterior medial border.  The common facial vein was exposed and then divided between 2-0 silk ties and metal clips.  The common carotid artery was then circumferentially exposed and encircled with an umbilical tape.  The vagus nerve was identified and protected.  Next sharp dissection was used to expose the external carotid artery and the superior thyroid artery.  The were encircled with a blue vessel loop and a 2-0 silk tie respectively.  Finally, the internal carotid was carefully dissected free.  An umbilical tape was placed around the internal carotid artery distal to the diseased segment.  The hypoglossal nerve was visualized throughout and protected.  The patient was given systemic heparinization.  A bovine carotid patch was selected and prepared on the back table.  A 10 french shunt was also prepared.  After blood  pressure readings were appropriate and the heparin had been given time to circulate, the internal carotid artery was occluded with a baby Gregory clamp.  The external and common carotid arteries were then occluded with vascular clamps and the 2-0 tie tightened on the superior thyroid artery.  A #11 blade was used to make an arteriotomy in the common carotid artery.  This was extended with Potts scissors along the anterior and lateral border of the common and internal carotid artery.  Approximately 85% stenosis was identified.  There was no thrombus identified.  The 10 french shunt was not placed, given pulsatile backbleeding.  A kleiner kuntz elevator was used to perform endarterectomy.  An eversion endarterectomy was performed in the external carotid artery.  A good distal endpoint was obtained in the internal carotid artery.  The specimen was removed and sent to pathology.  Heparinized saline was used to irrigate the endarterectomized field.  All potential embolic debris was removed.  Bovine pericardial patch angioplasty was then performed using a running 6-0 Prolene.  The common internal and external carotid arteries were all appropriately flushed. The artery was again irrigated with heparin saline.  The anastomosis was then secured. The clamp was first released on the external carotid artery followed by the common carotid artery approximately 30 seconds later, bloodflow was reestablish through the internal carotid artery.  Next, a hand-held  Doppler was used to evaluate the signals in the common, external, and internal  carotid arteries, all of which had appropriate signals. I then administered  50 mg protamine. The wound was then irrigated.  After hemostasis was achieved, the  carotid sheath was reapproximated with 3-0 Vicryl. The  platysma muscle was reapproximated with running 3-0 Vicryl. The skin  was closed with 4-0 Vicryl. Dermabond was placed on the skin. The  patient was then successfully extubated.  His neurologic exam was  similar to his preprocedural exam. The patient was then taken to recovery room  in stable condition. There were no complications.     Disposition:  To PACU in stable condition.  Relevant Operative Details:  Normal anatomy.  No shunt required  V. Annamarie Major, M.D. [facility] Office: [phone] Pager:  [phone]

## 2015-09-17 NOTE — Progress Notes (Addendum)
Remifentanil 1053 ug given during CAE....1mg  powder mixed with 10ml PFNS......981mcg wasted with pharmacist   Wandalee Ferdinand, CRNA   =========================   Addendum: - CRNA brought a syringed labeled remifenanil 50 mcg/mL, 24mL for me to witness wasting.  Kosisochukwu Burningham D. Mina Marble, PharmD, BCPS Pager:  [phone] 09/17/2015, 11:26 AM

## 2015-09-17 NOTE — Anesthesia Postprocedure Evaluation (Signed)
Anesthesia Post Note  Patient: [name] [name]  Procedure(s) Performed: Procedure(s) (LRB): Right Carotid ENDARTERECTOMY with Xenosure Patch Angioplasty (Right)  Anesthesia type: general  Patient location: PACU  Post pain: Pain level controlled  Post assessment: Patient's Cardiovascular Status Stable  Last Vitals:  Filed Vitals:   09/17/15 1315  BP:   Pulse: 67  Temp:   Resp: 11    Post vital signs: Reviewed and stable  Level of consciousness: sedated  Complications: No apparent anesthesia complications

## 2015-09-17 NOTE — Transfer of Care (Signed)
Immediate Anesthesia Transfer of Care Note  Patient: [name] [name]  Procedure(s) Performed: Procedure(s): Right Carotid ENDARTERECTOMY with Xenosure Patch Angioplasty (Right)  Patient Location: PACU  Anesthesia Type:General  Level of Consciousness: awake, alert , oriented and patient cooperative  Airway & Oxygen Therapy: Patient Spontanous Breathing and Patient connected to nasal cannula oxygen  Post-op Assessment: Report given to RN, Post -op Vital signs reviewed and stable, Patient moving all extremities X 4 and Patient able to stick tongue midline  Post vital signs: Reviewed and stable  Last Vitals:  Filed Vitals:   09/17/15 0638  BP: 120/55  Pulse: 66  Temp: 36.6 C  Resp: 20    Complications: No apparent anesthesia complications

## 2015-09-17 NOTE — Progress Notes (Signed)
  Day of Surgery Note    Subjective:  No complaints other than he wants to see his wife  Danley Danker Vitals:   09/17/15 1130  BP:   Pulse: 73  Temp:   Resp: 16    Incisions:   C/d/i without hematoma Extremities:  Moving all extremities equally Cardiac:  regular Lungs:  Non labored Neuro:  In tact; tongue midline  Assessment/Plan:  This is a 81 y.o. male who is s/p right carotid endarterectomy  -pt doing well this afternoon -to [facility] when bed available -anticipate d/c home tomorrow if he does well tonight -no pain from his kidney stones right now   Leontine Locket, PA-C 09/17/2015 11:55 AM

## 2015-09-18 LAB — BASIC METABOLIC PANEL
Anion gap: 13 (ref 5–15)
BUN: 16 mg/dL (ref 6–20)
CO2: 21 mmol/L — ABNORMAL LOW (ref 22–32)
Calcium: 6.5 mg/dL — ABNORMAL LOW (ref 8.9–10.3)
Chloride: 106 mmol/L (ref 101–111)
Creatinine, Ser: 1.35 mg/dL — ABNORMAL HIGH (ref 0.61–1.24)
GFR calc Af Amer: 55 mL/min — ABNORMAL LOW (ref >60)
GFR calc non Af Amer: 47 mL/min — ABNORMAL LOW (ref >60)
Glucose, Bld: 165 mg/dL — ABNORMAL HIGH (ref 65–99)
Potassium: 3.7 mmol/L (ref 3.5–5.1)
Sodium: 140 mmol/L (ref 135–145)

## 2015-09-18 LAB — CBC
HCT: 29.4 % — ABNORMAL LOW (ref 39.0–52.0)
Hemoglobin: 9.9 g/dL — ABNORMAL LOW (ref 13.0–17.0)
MCH: 32.8 pg (ref 26.0–34.0)
MCHC: 33.7 g/dL (ref 30.0–36.0)
MCV: 97.4 fL (ref 78.0–100.0)
Platelets: 110 10*3/uL — ABNORMAL LOW (ref 150–400)
RBC: 3.02 MIL/uL — ABNORMAL LOW (ref 4.22–5.81)
RDW: 14.2 % (ref 11.5–15.5)
WBC: 8.9 10*3/uL (ref 4.0–10.5)

## 2015-09-18 MED ORDER — HYDROCODONE-ACETAMINOPHEN 5-325 MG PO TABS: 1.0000 | ORAL_TABLET | ORAL | Status: DC | PRN

## 2015-09-18 MED ORDER — ENOXAPARIN SODIUM 30 MG/0.3ML ~~LOC~~ SOLN
30.0000 mg | SUBCUTANEOUS | Status: DC
  Filled 2015-09-18 (×2): qty 0.3

## 2015-09-18 MED ORDER — TAMSULOSIN HCL 0.4 MG PO CAPS: 0.4000 mg | ORAL_CAPSULE | Freq: Every day | ORAL | Status: DC

## 2015-09-18 NOTE — Progress Notes (Addendum)
UR COMPLETED  

## 2015-09-18 NOTE — Discharge Summary (Signed)
Discharge Summary     [name] [name] 1930/03/25 81 y.o. male  [id number]  Admission Date: 09/17/2015  Discharge Date: 09/18/15  Physician: Serafina Mitchell, MD  Admission Diagnosis: Right carotid artery stenosis I65.21   HPI:   This is a 81 y.o. male patient was initially referred to me several months ago for carotid stenosis. This was detected by hearing a bruit which led to an ultrasound. He has an outside CT scan that shows approximately 90% right carotid stenosis. I scheduled him for right carotid endarterectomy which he delayed. I needed to have clearance from cardiology which I have obtained. 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 He takes a statin for hypercholesterolemia. He takes an ARB for hypertension.  In September 2014, he was hospitalized with recurrent abdominal pain from SBO. It was managed conservatively with NGT and IVF. He developed pulmonary edema and and was intubated. Echo revealed 35-40%. He was diuresed and extubated. At that time, his creatinine was slightly elevated at 1.6 and was taken to the cath lab at a later date (Novemeber 2014) and was found to have a mid LAD stenosis and had a DES placed. He is on Plavix for this.   He states that he does have some pain in his feet at rest. He tells me that he had a herniated disk in the past and has had trouble with numbness and tingling in his legs. He states that after he underwent a cholecystectomy, he did not have any back pain after that.   Hospital Course:  The patient was admitted to the hospital and taken to the operating room on 09/17/2015 and underwent right carotid endarterectomy.  The pt tolerated the procedure well and was transported to the PACU in good condition.   By POD 1, the pt neuro status was in tact.  He did have some urinary retention and needed I&O cath x 2.  He was given Flomax, which had been prescribed by his urologist last week  who he saw for a kidney stone.  He was able to void on POD 1.  He was kept on Flomax at discharge.    The remainder of the hospital course consisted of increasing mobilization and increasing intake of solids without difficulty.    Recent Labs  09/18/15 0415  NA 140  K 3.7  CL 106  CO2 21*  GLUCOSE 165*  BUN 16  CALCIUM 6.5*    Recent Labs  09/17/15 1701 09/18/15 0415  WBC 7.5 8.9  HGB 11.9* 9.9*  HCT 34.8* 29.4*  PLT 121* 110*    Recent Labs  09/16/15 1456  INR 1.23     Discharge Instructions    CAROTID Sugery: Call MD for difficulty swallowing or speaking; weakness in arms or legs that is a new symtom; severe headache.  If you have increased swelling in the neck and/or  are having difficulty breathing, CALL 911    Complete by:  As directed      Call MD for:  redness, tenderness, or signs of infection (pain, swelling, bleeding, redness, odor or green/yellow discharge around incision site)    Complete by:  As directed      Call MD for:  severe or increased pain, loss or decreased feeling  in affected limb(s)    Complete by:  As directed      Call MD for:  temperature >100.5    Complete by:  As directed  Discharge wound care:    Complete by:  As directed   Shower daily with soap and water starting 09/19/15     Driving Restrictions    Complete by:  As directed   No driving for 2 weeks     Lifting restrictions    Complete by:  As directed   No lifting for 2 weeks     Resume previous diet    Complete by:  As directed            Discharge Diagnosis:  Right carotid artery stenosis I65.21  Secondary Diagnosis: Patient Active Problem List   Diagnosis Date Noted  . Carotid stenosis 09/17/2015  . Right carotid bruit 12/14/2014  . Unstable angina ([place]) 10/10/2013  . Ischemic cardiomyopathy   . Hyperlipidemia   . Chronic systolic CHF (congestive heart failure) ([place]) 10/03/2013  . CAD (coronary artery disease) 10/03/2013  . Acute systolic heart failure  ([place]) 08/11/2013  . Chronic kidney disease (CKD), stage III (moderate) 08/11/2013  . Acute respiratory failure with hypoxia ([place]) 08/09/2013  . Acute pulmonary edema ([place]) 08/09/2013  . Small bowel obstruction ([place]) 08/09/2013   Past Medical History  Diagnosis Date  . Small bowel perforation ([place])     a. 11/2012 s/p emergent SB resection.  . Diverticulitis   . Coronary artery disease     a. 09/2013 Cath: LM min irregs, LAD 95p(2.75x20 Promus Premier DES & 3.0x20 Promus Premier DES overlapping), D1 50-60, LCX 30p, OM1 7m, OM2 60-70p, RCA small, nl.  . Ischemic cardiomyopathy     a. 07/2013 Echo: EF 35-40%, apical septal, apical lat, apical AK, mod MR.  . Chronic systolic CHF (congestive heart failure) ([place])     a. 07/2013 Echo: EF 35-40%  . Hyperlipidemia   . Carotid artery occlusion   . CML (chronic myelocytic leukemia) ([place])   . History of hiatal hernia   . CKD (chronic kidney disease), stage III     pt. states that he does not have CKD  . History of small bowel obstruction   . History of kidney stones       Medication List    STOP taking these medications        oxyCODONE-acetaminophen 5-325 MG tablet  Commonly known as:  PERCOCET      TAKE these medications        aspirin EC 81 MG tablet  Take 1 tablet (81 mg total) by mouth daily.     atorvastatin 40 MG tablet  Commonly known as:  LIPITOR  TAKE 1 TABLET BY MOUTH DAILY AT 6:00 PM     carvedilol 12.5 MG tablet  Commonly known as:  COREG  Take 0.5 tablets (6.25 mg total) by mouth 2 (two) times daily with a meal.     CoQ10 200 MG Caps  Take 1 capsule by mouth daily.     furosemide 20 MG tablet  Commonly known as:  LASIX  Take 1 tablet (20 mg total) by mouth every other day. Take an additional 20mg  daily as needed for swelling.     HYDROcodone-acetaminophen 5-325 MG tablet  Commonly known as:  NORCO/VICODIN  Take 1 tablet by mouth every 4 (four) hours as needed for moderate pain.     Krill Oil 300 MG Caps    Take 1 capsule by mouth daily.     losartan 25 MG tablet  Commonly known as:  COZAAR  Take 12.5 mg by mouth daily.     Magnesium 250 MG Tabs  Take 1 tablet by mouth daily.     NIACIN PO  Take 1 tablet by mouth daily.     pantoprazole 40 MG tablet  Commonly known as:  PROTONIX  Take 1 tablet by mouth daily as needed.     Potassium Gluconate 595 MG Caps  Take 1 capsule by mouth daily.     spironolactone 25 MG tablet  Commonly known as:  ALDACTONE  TAKE 1/2 TABLET BY MOUTH EVERY DAY     tamsulosin 0.4 MG Caps capsule  Commonly known as:  FLOMAX  Take 1 capsule (0.4 mg total) by mouth daily.     vitamin A 8000 UNIT capsule  Take 8,000 Units by mouth daily.     VITAMIN B 12 PO  Take 1,000 mcg by mouth daily.     VITAMIN B-1 PO  Take 1 tablet by mouth daily.     VITAMIN C PO  Take 2,000 mg by mouth daily.     VITAMIN E PO  Take 1 tablet by mouth daily.        Prescriptions given: Vicodin #20 No Refill  Disposition: home  Patient's condition: is Good  Follow up: 1. Dr. Trula Slade in 2 weeks.   Leontine Locket, PA-C [facility] [phone]  --- For [facility] use --- Instructions: Press F2 to tab through selections.  Delete question if not applicable.   Modified Rankin score at D/C (0-6): 0  IV medication needed for:  1. Hypertension: No 2. Hypotension: No  Post-op Complications: No  1. Post-op CVA or TIA: No  If yes: Event classification (right eye, left eye, right cortical, left cortical, verterobasilar, other): n/a  If yes: Timing of event (intra-op, <6 hrs post-op, >=6 hrs post-op, unknown): n/a  2. CN injury: No  If yes: CN n/a injuried   3. Myocardial infarction: No  If yes: Dx by (EKG or clinical, Troponin): n/a  4.  CHF: No  5.  Dysrhythmia (new): No  6. Wound infection: No  7. Reperfusion symptoms: No  8. Return to OR: No  If yes: return to OR for (bleeding, neurologic, other CEA incision, other):  n/a  Discharge medications: Statin use:  Yes If No: [ ]  For Medical reasons, [ ]  Non-compliant, [ ]  Not-indicated ASA use:  Yes  If No: [ ]  For Medical reasons, [ ]  Non-compliant, [ ]  Not-indicated Beta blocker use:  Yes If No: [ ]  For Medical reasons, [ ]  Non-compliant, [ ]  Not-indicated ACE-Inhibitor use:  No If No: [ ]  For Medical reasons, [ ]  Non-compliant, [ ]  Not-indicated ARB use:  Yes P2Y12 Antagonist use: No, [ ]  Plavix, [ ]  Plasugrel, [ ]  Ticlopinine, [ ]  Ticagrelor, [ ]  Other, [ ]  No for medical reason, [ ]  Non-compliant, [ ]  Not-indicated Anti-coagulant use:  No, [ ]  Warfarin, [ ]  Rivaroxaban, [ ]  Dabigatran, [ ]  Other, [ ]  No for medical reason, [ ]  Non-compliant, [ ]  Not-indicated

## 2015-09-18 NOTE — Progress Notes (Addendum)
  Progress Note 09/18/2015 7:43 AM 1 Day Post-Op  Subjective:  Per RN, could not void since surgery and has received two I&O cath's with the last being at 6am this morning.    Afebrile HR 50's-70's NSR 244'W-102'V systolic 25% RA  Filed Vitals:   09/18/15 0400  BP: 103/50  Pulse: 61  Temp: 98.4 F (36.9 C)  Resp: 17   Physical Exam: Cardiac:  regular Lungs:  Non labored Incisions:  C/d/i Extremities:  All extremities equal.   Neuro:  In tact; tongue midline; no issues swallowing  CBC    Component Value Date/Time   WBC 8.9 09/18/2015 0415   RBC 3.02* 09/18/2015 0415   HGB 9.9* 09/18/2015 0415   HCT 29.4* 09/18/2015 0415   PLT 110* 09/18/2015 0415   MCV 97.4 09/18/2015 0415   MCH 32.8 09/18/2015 0415   MCHC 33.7 09/18/2015 0415   RDW 14.2 09/18/2015 0415   LYMPHSABS 1.1 09/06/2015 1730   MONOABS 0.7 09/06/2015 1730   EOSABS 0.4 09/06/2015 1730   BASOSABS 0.0 09/06/2015 1730   BMET    Component Value Date/Time   NA 140 09/18/2015 0415   K 3.7 09/18/2015 0415   CL 106 09/18/2015 0415   CO2 21* 09/18/2015 0415   GLUCOSE 165* 09/18/2015 0415   BUN 16 09/18/2015 0415   CREATININE 1.35* 09/18/2015 0415   CALCIUM 6.5* 09/18/2015 0415   GFRNONAA 47* 09/18/2015 0415   GFRAA 55* 09/18/2015 0415    INR    Component Value Date/Time   INR 1.23 09/16/2015 1456     Intake/Output Summary (Last 24 hours) at 09/18/15 0743 Last data filed at 09/18/15 0600  Gross per 24 hour  Intake   2300 ml  Output   1700 ml  Net    600 ml   Assessment:  81 y.o. male is s/p:  Right carotid endarterectomy  1 Day Post-Op  Plan: -pt neurologically in tact -he has not been able to void and has required I&O cath x 2 with the last being at 6am this morning.  He saw a urologist last week for kidney stones and was given Flomax, however, he has not taken this regularly.  He did receive a dose last night and is scheduled for a dose this morning at 10am.  I did discuss with the pt that  we will give him some time to void today, but if he can't, he may require a foley to go home with and f/u with the urologist next week.   -pt has ambulated around the unit this morning -DVT prophylaxis:  Lovenox to start this morning. -renal function stable and actually better than earlier this month.  Leontine Locket, PA-C [facility] [phone] 09/18/2015 7:43 AM  Agree with plans for D/C. Was able to void  Deitra Mayo, MD, [place] [phone] Office: [phone]

## 2015-09-20 ENCOUNTER — Encounter (HOSPITAL_COMMUNITY): Payer: Self-pay | Admitting: Surgery

## 2015-09-21 ENCOUNTER — Other Ambulatory Visit: Payer: Self-pay

## 2015-09-21 ENCOUNTER — Telehealth: Payer: Self-pay | Admitting: Surgery

## 2015-09-21 MED ORDER — ATORVASTATIN CALCIUM 40 MG PO TABS: 40.0000 mg | ORAL_TABLET | Freq: Every day | ORAL | Status: DC

## 2015-09-21 NOTE — Telephone Encounter (Signed)
Spoke with pt to schedule, dpm °

## 2015-09-21 NOTE — Telephone Encounter (Signed)
-----   Message from Gabriel Earing, [place] sent at 09/18/2015  7:53 AM EDT ----- S/p right CEA f/u with Dr. Trula Slade in 2 weeks.

## 2015-09-30 ENCOUNTER — Encounter: Payer: Self-pay | Admitting: Surgery

## 2015-10-04 ENCOUNTER — Telehealth: Payer: Self-pay | Admitting: Cardiology

## 2015-10-04 ENCOUNTER — Ambulatory Visit (INDEPENDENT_AMBULATORY_CARE_PROVIDER_SITE_OTHER): Payer: Self-pay | Admitting: Surgery

## 2015-10-04 ENCOUNTER — Encounter: Payer: Self-pay | Admitting: Surgery

## 2015-10-04 VITALS — BP 108/51 | HR 60 | Ht 69.0 in | Wt 143.7 lb

## 2015-10-04 DIAGNOSIS — I6523 Occlusion and stenosis of bilateral carotid arteries: Secondary | ICD-10-CM

## 2015-10-04 NOTE — Telephone Encounter (Signed)
New Message  Pt c/o medication issue:  4. What is your medication issue? Pt daughter called. Request a call back to discuss the patients medication list. She said that there was a discrepancy with the primary care office and she would like clarification of the medications that the pt is taking. Please call back to discuss

## 2015-10-04 NOTE — Progress Notes (Signed)
Postoperative Visit   History of Present Illness  [name] [name] is a 81 y.o. male who presents for postoperative follow-up for: right carotid endarterectomy (Date: 09/17/2015) for a 90% asymptomatic right carotid stenosis by CTA scan.  The patient's neck incision is nearly healed.  The patient has had not stroke or TIA symptoms. He complains of some pain around his chin and jaw with eating. He also mentions some mild numbness around his right neck incision.   For V[facility] Use Only  PRE-ADM LIVING: Home  AMB STATUS: Ambulatory  Social History   Social History  . Marital Status: Married    Spouse Name: N/A  . Number of Children: N/A  . Years of Education: N/A   Occupational History  . Not on file.   Social History Main Topics  . Smoking status: Former Smoker -- 1.00 packs/day for 45 years    Types: Cigarettes    Quit date: 06/09/1999  . Smokeless tobacco: Never Used  . Alcohol Use: No  . Drug Use: No  . Sexual Activity: Not Currently   Other Topics Concern  . Not on file   Social History Narrative   Pt lives in [place] with his wife of 14 yrs (second wife).  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  Very active @ home.  Still push mows his yard.    Current Outpatient Prescriptions on File Prior to Visit  Medication Sig Dispense Refill  . Ascorbic Acid (VITAMIN C PO) Take 2,000 mg by mouth daily.     Marland Kitchen aspirin EC 81 MG tablet Take 1 tablet (81 mg total) by mouth daily. 90 tablet 3  . atorvastatin (LIPITOR) 40 MG tablet Take 1 tablet (40 mg total) by mouth daily at 6 PM. 30 tablet 10  . carvedilol (COREG) 12.5 MG tablet Take 0.5 tablets (6.25 mg total) by mouth 2 (two) times daily with a meal. 30 tablet 3  . Coenzyme Q10 (COQ10) 200 MG CAPS Take 1 capsule by mouth daily.    . Cyanocobalamin (VITAMIN B 12 PO) Take 1,000 mcg by mouth daily.     . furosemide (LASIX) 20 MG tablet Take 1 tablet (20 mg total) by mouth every other day. Take an  additional 20mg  daily as needed for swelling. 45 tablet 0  . HYDROcodone-acetaminophen (NORCO/VICODIN) 5-325 MG tablet Take 1 tablet by mouth every 4 (four) hours as needed for moderate pain. 20 tablet 0  . Krill Oil 300 MG CAPS Take 1 capsule by mouth daily.    Marland Kitchen losartan (COZAAR) 25 MG tablet Take 12.5 mg by mouth daily.  0  . Magnesium 250 MG TABS Take 1 tablet by mouth daily.    Marland Kitchen NIACIN PO Take 1 tablet by mouth daily.    . pantoprazole (PROTONIX) 40 MG tablet Take 1 tablet by mouth daily as needed.    . Potassium Gluconate 595 MG CAPS Take 1 capsule by mouth daily.    Marland Kitchen spironolactone (ALDACTONE) 25 MG tablet TAKE 1/2 TABLET BY MOUTH EVERY DAY 15 tablet 1  . tamsulosin (FLOMAX) 0.4 MG CAPS capsule Take 1 capsule (0.4 mg total) by mouth daily. 30 capsule 0  . Thiamine HCl (VITAMIN B-1 PO) Take 1 tablet by mouth daily.    . vitamin A 8000 UNIT capsule Take 8,000 Units by mouth daily.    Marland Kitchen VITAMIN E PO Take 1 tablet by mouth daily.     Current Facility-Administered Medications on File Prior to  Visit  Medication Dose Route Frequency Provider Last Rate Last Dose  . midazolam (VERSED) 5 MG/5ML injection    Anesthesia Intra-op Janine Ores, CRNA   2 mg at 08/09/13 1146  . propofol (DIPRIVAN) 10 mg/mL bolus/IV push    Anesthesia Intra-op Janine Ores, CRNA   110 mg at 09/17/15 0842    Physical Examination  Filed Vitals:   10/04/15 1517  BP: 108/51  Pulse:     Right Neck: Incision is nearly healed. Incision is clean.  Neuro: CN 2-12 are intact, Motor strength is 5/5 bilaterally, sensation is grossly intact  Medical Decision Making  [name] [name] is a 81 y.o. male who presents s/p right CEA.  The patient's neck incision is healing with no stroke symptoms. The patient is on maximal medical management with ASA and a statin. He is a former smoker.  The patient's surveillance will included routine carotid duplex studies which will be completed in: 9 months, at which time the  patient will be re-evaluated.   Assured the patient that his jaw and chin symptoms will likely resolve within the next 2 months.   Virgina Jock, PA-C [facility] [place] Office: [phone]  This patient was seen and examined in conjunction with Dr. Trula Slade.   I agree with the above.  I have seen and evaluated the patient.  This is an 81 year old gentleman who is status post right carotid endarterectomy with patch angioplasty on 09/17/2015 for asymptomatic stenosis.  Intraoperative findings included an 85% stenosis.  Patient has minimal complaints.  He does occasionally get a sharp pain while chewing.  He also has some numbness along his cheek.  I reassured him that the symptoms should resolve over time, however the numbness along the jaw area may persist.  He isn't has healed quite nicely.  I have been following up in 9 months with a repeat ultrasound  Annamarie Major

## 2015-10-04 NOTE — Addendum Note (Signed)
Addended by: Dorthula Rue L on: 10/04/2015 04:40 PM   Modules accepted: Orders

## 2015-10-05 NOTE — Telephone Encounter (Signed)
Follow up      Calling to talk to a nurse to go over medication list.  Daughter is not sure if pt is taking what he is supposed to be taking.

## 2015-10-06 NOTE — Telephone Encounter (Signed)
I have been out so have just received this mess, will send to [place], [place]

## 2015-10-06 NOTE — Telephone Encounter (Signed)
Spoke with patient's daughter Bev who stated patient was confused about whether or not he should still be on Plavix. Per 08/18/2014 clinic note, Dr. Aundra Dubin agreed with discontinuation of Plavix in 10/2014 d/t bruising but recommended continuation of long-term ASA 81 mg daily. Daughter verbalized understanding and was grateful for clarification.   Ruta Hinds. Velva Harman, PharmD, BCPS, CPP Clinical Pharmacist Pager: [phone] Phone: [phone] 10/06/2015 4:28 PM

## 2015-10-18 ENCOUNTER — Other Ambulatory Visit: Payer: Self-pay | Admitting: *Deleted

## 2015-10-18 DIAGNOSIS — Z9889 Other specified postprocedural states: Secondary | ICD-10-CM

## 2015-10-18 DIAGNOSIS — Z856 Personal history of leukemia: Secondary | ICD-10-CM

## 2015-10-18 DIAGNOSIS — R221 Localized swelling, mass and lump, neck: Secondary | ICD-10-CM

## 2015-10-19 ENCOUNTER — Encounter: Payer: Self-pay | Admitting: Family

## 2015-10-19 ENCOUNTER — Ambulatory Visit (HOSPITAL_COMMUNITY)
Admission: RE | Admit: 2015-10-19 | Discharge: 2015-10-19 | Disposition: A | Discharge status: Home / Self Care | Payer: Medicare HMO | Source: Ambulatory Visit | Attending: Family | Admitting: Family

## 2015-10-19 ENCOUNTER — Ambulatory Visit (INDEPENDENT_AMBULATORY_CARE_PROVIDER_SITE_OTHER): Payer: Medicare HMO | Admitting: Family

## 2015-10-19 VITALS — BP 107/64 | HR 65 | Temp 97.4°F | Resp 16 | Ht 69.5 in | Wt 143.0 lb

## 2015-10-19 DIAGNOSIS — R221 Localized swelling, mass and lump, neck: Secondary | ICD-10-CM | POA: Diagnosis not present

## 2015-10-19 DIAGNOSIS — Z9889 Other specified postprocedural states: Secondary | ICD-10-CM

## 2015-10-19 DIAGNOSIS — Z856 Personal history of leukemia: Secondary | ICD-10-CM

## 2015-10-19 DIAGNOSIS — T814XXA Infection following a procedure, initial encounter: Secondary | ICD-10-CM

## 2015-10-19 DIAGNOSIS — IMO0001 Reserved for inherently not codable concepts without codable children: Secondary | ICD-10-CM

## 2015-10-19 MED ORDER — CEPHALEXIN 500 MG PO CAPS: 500.0000 mg | ORAL_CAPSULE | Freq: Three times a day (TID) | ORAL | Status: DC

## 2015-10-19 NOTE — Progress Notes (Signed)
Postoperative Visit   History of Present Illness  [name] [name] is a 81 y.o. male patient whom Dr. Trula Slade last saw on 10/04/15. The patient is status post right carotid endarterectomy with patch angioplasty on 09/17/2015 for asymptomatic stenosis. Intraoperative findings included an 85% stenosis. At his 10/04/15 visit the patient had occasional sharp pain while chewing. He also had some numbness along his cheek. Dr. Trula Slade reassured the patient that the symptoms should resolve over time, however the numbness along the jaw area may persist. He had healed quite nicely.The patient was to follow up in 9 months with a repeat ultrasound.  He returns today with c/o nodule at the mid portion of his right CEA incision that he states has been present since the CEA. He reports that it has not increased nor decreased in size. He denies fever or chills, denies drainage from the nodule, denies pain at the nodule.   The patient's neck incision is healed.  The patient has had no stroke or TIA symptoms.  His medical history is significant for CML, he takes [place] for this.    For VQI Use Only  PRE-ADM LIVING: Home  AMB STATUS: Ambulatory  Social History   Social History  . Marital Status: Married    Spouse Name: N/A  . Number of Children: N/A  . Years of Education: N/A   Occupational History  . Not on file.   Social History Main Topics  . Smoking status: Former Smoker -- 1.00 packs/day for 45 years    Types: Cigarettes    Quit date: 06/09/1999  . Smokeless tobacco: Never Used  . Alcohol Use: No  . Drug Use: No  . Sexual Activity: Not Currently   Other Topics Concern  . Not on file   Social History Narrative   Pt lives in [place] with his wife of 14 yrs (second wife).  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  Very active @ home.  Still push mows his yard.    Current Outpatient Prescriptions on File Prior to Visit  Medication Sig  Dispense Refill  . Ascorbic Acid (VITAMIN C PO) Take 2,000 mg by mouth daily.     Marland Kitchen aspirin EC 81 MG tablet Take 1 tablet (81 mg total) by mouth daily. 90 tablet 3  . atorvastatin (LIPITOR) 40 MG tablet Take 1 tablet (40 mg total) by mouth daily at 6 PM. 30 tablet 10  . carvedilol (COREG) 12.5 MG tablet Take 0.5 tablets (6.25 mg total) by mouth 2 (two) times daily with a meal. 30 tablet 3  . Coenzyme Q10 (COQ10) 200 MG CAPS Take 1 capsule by mouth daily.    . Cyanocobalamin (VITAMIN B 12 PO) Take 1,000 mcg by mouth daily.     . furosemide (LASIX) 20 MG tablet Take 1 tablet (20 mg total) by mouth every other day. Take an additional 20mg  daily as needed for swelling. 45 tablet 0  . HYDROcodone-acetaminophen (NORCO/VICODIN) 5-325 MG tablet Take 1 tablet by mouth every 4 (four) hours as needed for moderate pain. 20 tablet 0  . Krill Oil 300 MG CAPS Take 1 capsule by mouth daily.    Marland Kitchen losartan (COZAAR) 25 MG tablet Take 12.5 mg by mouth daily.  0  . Magnesium 250 MG TABS Take 1 tablet by mouth daily.    Marland Kitchen NIACIN PO Take 1 tablet by mouth daily.    . pantoprazole (PROTONIX) 40 MG tablet Take 1 tablet by mouth  daily as needed.    . Potassium Gluconate 595 MG CAPS Take 1 capsule by mouth daily.    Marland Kitchen spironolactone (ALDACTONE) 25 MG tablet TAKE 1/2 TABLET BY MOUTH EVERY DAY 15 tablet 1  . tamsulosin (FLOMAX) 0.4 MG CAPS capsule Take 1 capsule (0.4 mg total) by mouth daily. 30 capsule 0  . Thiamine HCl (VITAMIN B-1 PO) Take 1 tablet by mouth daily.    . vitamin A 8000 UNIT capsule Take 8,000 Units by mouth daily.    Marland Kitchen VITAMIN E PO Take 1 tablet by mouth daily.     Current Facility-Administered Medications on File Prior to Visit  Medication Dose Route Frequency Provider Last Rate Last Dose  . midazolam (VERSED) 5 MG/5ML injection    Anesthesia Intra-op Janine Ores, CRNA   2 mg at 08/09/13 1146  . propofol (DIPRIVAN) 10 mg/mL bolus/IV push    Anesthesia Intra-op Janine Ores, CRNA   110 mg at  09/17/15 0842    Physical Examination  Filed Vitals:   10/19/15 1340 10/19/15 1345  BP: 113/66 107/64  Pulse: 67 65  Temp: 97.4 F (36.3 C)   Resp: 16   Height: 5' 9.5" (1.765 m)   Weight: 143 lb (64.864 kg)   SpO2: 98%    Body mass index is 20.82 kg/(m^2).  Right side of Neck: Incision is healed. There is a suture abscess at the mid portion of his incision. Dr. Kellie Simmering expressed a small amount of white and sanguinous drainage and soft nodule decreased in size after this.   Neuro: CN 2-12 are intact, Motor strength is 5/5 bilaterally, sensation is grossly intact.    Medical Decision Making  [name] [name] is a 81 y.o. male who presents s/p right CEA on 09/17/15.  The patient's neck incision is healing with no stroke symptoms.  Suture abscess expressed and the soft nodule at his incision decreased in size.  Will place on Keflex 500 mg tid x 1 week since he has CML.  Follow up with Dr. Trula Slade in 1-2 weeks; pt advised that he may cancel this appointment if the suture abscess site improves.  He is to follow up as scheduled in about 8 months with carotid duplex.    The patient is currently on an antiplatelet: ASA 81 mg. The patient is currently on a statin.    Shakesha Soltau, Sharmon Leyden, RN, MSN, FNP-C [facility] [place] Office: [phone]  10/19/2015, 1:38 PM  Clinic MD: Early

## 2015-10-26 ENCOUNTER — Telehealth: Payer: Self-pay | Admitting: Cardiology

## 2015-10-26 ENCOUNTER — Encounter: Payer: Self-pay | Admitting: Surgery

## 2015-10-26 NOTE — Telephone Encounter (Signed)
New MESSAGE   Pt daughter is calling for a rn to call her about her fathers lab results on his potassium

## 2015-10-26 NOTE — Telephone Encounter (Signed)
Pts daughter was sitting next to the pt and she wanted for a clinical nurse to explain to him how important it is for him to take his potassium gluconate, as ordered everyday, for the pt is convinced this med causes him to be tired.  Spoke with the daughter and the pt, to inform them both that taking this medication is extremely important, for if his K level drops or increases too much, this can cause the pt to go into abnormal and even dangerous cardiac rhythms.  Pts daughter states that the pt has no other complaints at this time.  Pts daughter states that the reason the pt is more fatigued is because he had a stomach virus, and had extreme bouts of diarrhea for a couple of days.  Informed both parties that given he has probably already depleted some important electrolytes with having diarrhea, then he most definitely now needs to take his potassium medication, to normalize this and prevent any abnormal cardiac rhythms.  Both the pt and the daughter verbalized understanding and agrees with this plan.

## 2015-10-27 NOTE — Telephone Encounter (Signed)
[facility] Failure [facility] patient, will forward

## 2015-10-28 ENCOUNTER — Telehealth: Payer: Self-pay | Admitting: Cardiology

## 2015-10-28 NOTE — Telephone Encounter (Signed)
NewMessage  Pt daughter calling to speak w/ RN- pt wanted to know ifhe needs to continue w/ his potassium- pt daughter stated he has been feeling tired lately. Please call back and discuss.

## 2015-10-29 ENCOUNTER — Other Ambulatory Visit (HOSPITAL_COMMUNITY): Payer: Self-pay | Admitting: Cardiology

## 2015-11-01 ENCOUNTER — Ambulatory Visit: Payer: Medicare HMO | Admitting: Surgery

## 2015-11-04 NOTE — Telephone Encounter (Signed)
Heart Failure patient, will forward

## 2015-11-04 NOTE — Telephone Encounter (Signed)
Follow up      Talk to a nurse regarding a presc for potassium.  Daughter is concerned that he is not getting enough potassium.  It is not clear whether he has had a presc for it in the past.

## 2015-11-04 NOTE — Telephone Encounter (Signed)
Spoke w/pt's daughter she states pt has been taking OTC Potassium Gluconate but has recently stopped because he was feeling tired.  She states on 11/17 he saw his oncologist and was told K was low at 3.4 she is not sure if he was taking the potassium at that time or not.  She feels like pt should be on rx potassium if needed and have his level monitored.  Advised pt will need labs to see what his level is before Dr Aundra Dubin can order.  Pt is due for f/u w/Dr Aundra Dubin, offered to sch appt with [place] on Tue 12/20 however she states they prefer to see Dr Aundra Dubin at [facility] instead of our office.  Advised will send to Dr Aundra Dubin to review and he will have to ok pt being seen at [facility].

## 2015-11-04 NOTE — Telephone Encounter (Signed)
OK to see at [facility] but schedule full in January, will probably have to see PA over there.  Have him get BMET regardless, will likely need KCl.

## 2015-11-08 NOTE — Telephone Encounter (Signed)
Left message to call back  

## 2015-11-15 ENCOUNTER — Other Ambulatory Visit: Payer: Self-pay | Admitting: Cardiology

## 2015-11-16 ENCOUNTER — Telehealth (HOSPITAL_COMMUNITY): Payer: Self-pay | Admitting: Vascular Surgery

## 2015-11-16 NOTE — Telephone Encounter (Signed)
Returned pt daughter call about pt appt time and date

## 2015-11-16 NOTE — Telephone Encounter (Signed)
Returned pt daughter call about pt date and time of pt appt , she did not answer does not have a Advertising account executive

## 2015-12-06 ENCOUNTER — Other Ambulatory Visit (HOSPITAL_COMMUNITY): Payer: Self-pay | Admitting: Cardiology

## 2015-12-08 ENCOUNTER — Encounter (HOSPITAL_COMMUNITY): Payer: Self-pay

## 2015-12-08 ENCOUNTER — Ambulatory Visit (HOSPITAL_COMMUNITY)
Admission: RE | Admit: 2015-12-08 | Discharge: 2015-12-08 | Disposition: A | Discharge status: Home / Self Care | Payer: Medicare HMO | Source: Ambulatory Visit | Attending: Cardiology | Admitting: Cardiology

## 2015-12-08 VITALS — BP 126/60 | HR 72 | Wt 146.5 lb

## 2015-12-08 DIAGNOSIS — I5022 Chronic systolic (congestive) heart failure: Secondary | ICD-10-CM | POA: Insufficient documentation

## 2015-12-08 DIAGNOSIS — E785 Hyperlipidemia, unspecified: Secondary | ICD-10-CM | POA: Insufficient documentation

## 2015-12-08 DIAGNOSIS — N189 Chronic kidney disease, unspecified: Secondary | ICD-10-CM | POA: Diagnosis not present

## 2015-12-08 DIAGNOSIS — N183 Chronic kidney disease, stage 3 unspecified: Secondary | ICD-10-CM

## 2015-12-08 DIAGNOSIS — I2510 Atherosclerotic heart disease of native coronary artery without angina pectoris: Secondary | ICD-10-CM

## 2015-12-08 DIAGNOSIS — I6529 Occlusion and stenosis of unspecified carotid artery: Secondary | ICD-10-CM | POA: Diagnosis not present

## 2015-12-08 DIAGNOSIS — I255 Ischemic cardiomyopathy: Secondary | ICD-10-CM

## 2015-12-08 LAB — BASIC METABOLIC PANEL
Anion gap: 8 (ref 5–15)
BUN: 14 mg/dL (ref 6–20)
CO2: 27 mmol/L (ref 22–32)
Calcium: 8.3 mg/dL — ABNORMAL LOW (ref 8.9–10.3)
Chloride: 106 mmol/L (ref 101–111)
Creatinine, Ser: 1.67 mg/dL — ABNORMAL HIGH (ref 0.61–1.24)
GFR calc Af Amer: 42 mL/min — ABNORMAL LOW (ref >60)
GFR calc non Af Amer: 36 mL/min — ABNORMAL LOW (ref >60)
Glucose, Bld: 101 mg/dL — ABNORMAL HIGH (ref 65–99)
Potassium: 4.1 mmol/L (ref 3.5–5.1)
Sodium: 141 mmol/L (ref 135–145)

## 2015-12-08 MED ORDER — SPIRONOLACTONE 25 MG PO TABS: 25.0000 mg | ORAL_TABLET | Freq: Every day | ORAL | Status: DC

## 2015-12-08 NOTE — Patient Instructions (Signed)
Increase Spirolactone to 25 mg (1 tab) daily  Labs today  Labs in 1 week  We will contact you in 6 months to schedule your next appointment.

## 2015-12-08 NOTE — Progress Notes (Signed)
[name], male   DOB: [birth year], 82 y.o.   MRN: [id number]    Advanced Heart Failure Clinic Note   PCP: Dr. Sallyanne Havers [facility])  82 yo male with history of recurrent small bowel obstruction and CAD with ischemic cardiomyopathy presents for followup.  In 9/14, patient was hospitalized with recurrent abdominal pain from small bowel obstruction.  This was managed conservatively with NG tube/NPO and IV fluid.  In the hospital, the patient developed pulmonary edema and actually ended up being intubated.  Troponin was normal. Echo was done showing EF 35-40% with anteroseptal, anterolateral, and apical akinesis along with moderate AI.  He had no prior known cardiac disease.  It was thought that the patient had had an out of hospital MI at some point then developed pulmonary edema with IVF while NPO.  He was diuresed and extubated.  Creatinine was up to about 1.6 in the hospital so no cardiac cath was done (not thought to be urgent given normal creatinine).   I took him for [facility] in 11/14.  This showed 95% mid LAD stenosis that was treated with 2 overlapping Promus DES.  Echo in 2/15 showed EF 30-35% with normal RV.  Coreg was cut back to 6.25 bid due to bradycardia and fatigue.   Returns today for regular follow up. Had R CEA in November with Dr Trula Slade. 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 Continues to have lower extremity edema and facial edema that has been present since starting imatinib.  Seems to be worse in morning.  Still taking lasix 20 mg daily.  Has not needed any extra.  Weight at home in 140s. He denies DOE.  Feels good overall. States his CML is "nearly in remission" by recent labs. No exertional dyspnea or chest pain.   Labs (9/14): K 3.4, creatinine 1.55 => 1.4, LDL 89, HDL 38 Labs (11/14): K 4.9, creatinine 1.6 Labs (1/15): K 4.1, creatinine 1.4 Labs (2/15): LDL 34, HDL 32 Labs (2/16): K 4.5, creatinine 1.5 Labs (8/16): LDL 26, HDL 33 Labs (11/16): K 3.4, creatinine 1.49  PMH: 1.  Nephrolithiasis 2. H/o CCY 3. H/o diverticulitis 4. H/o SBO: initially in 1/14 with small bowel resection, again in 9/14 treated conservatively.  5. Ischemic cardiomyopathy: Echo (9/14) with EF 35-40%, anteroseptal/anterolateral/apical akinesis and moderate AI.  Patient had acute pulmonary edema while hospitalized in 9/14. Echo (2/15) with EF 30-35% with wall motion abnormalities, normal RV size and systolic function, mild MR and mild AI.  6. CKD 7. Aortic insufficiency: mild on last echo 8. CAD: LHC (11/14) with 95% mLAD stenosis treated with 2 overlapping Promus DES.  There was residual 50% stenosis beyond the stent margin.   9. Carotid stenosis: 2/16 carotid dopplers with 80-99% RICA stenosis, A999333 LICA stenosis. CTA neck showed 90% RICA stenosis. Right CEA 10/16.  10. CML: Diagnosed in 2016, on imatinib.   SH: Married (2nd time) with 4 children.  Lives in [place].  Previous heavy smoker.  Retired Chief Financial Officer.   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   ROS: All systems reviewed and negative except as per HPI.   Current Outpatient Prescriptions  Medication Sig Dispense Refill  . Ascorbic Acid (VITAMIN C PO) Take 2,000 mg by mouth daily.     Marland Kitchen aspirin EC 81 MG tablet Take 1 tablet (81 mg total) by mouth daily. 90 tablet 3  . atorvastatin (LIPITOR) 40 MG tablet Take 1 tablet (40 mg total) by mouth daily at 6 PM. 30 tablet 10  .  carvedilol (COREG) 12.5 MG tablet TAKE 1/2 TABLET BY MOUTH TWICE DAILY WITH A MEAL 30 tablet 0  . Coenzyme Q10 (COQ10) 200 MG CAPS Take 1 capsule by mouth daily.    . Cyanocobalamin (VITAMIN B 12 PO) Take 1,000 mcg by mouth daily.     . furosemide (LASIX) 20 MG tablet Take 1 tablet (20 mg total) by mouth daily. Take an additional 20mg  daily as needed for swelling 30 tablet 3  . Krill Oil 300 MG CAPS Take 1 capsule by mouth daily.    Marland Kitchen losartan (COZAAR) 25 MG tablet Take 12.5 mg by mouth daily.  0  . NIACIN PO Take 1 tablet by mouth daily.    . Potassium  Gluconate 595 MG CAPS Take 1 capsule by mouth daily.    Marland Kitchen spironolactone (ALDACTONE) 25 MG tablet TAKE 1/2 TABLET BY MOUTH DAILY 15 tablet 0  . tamsulosin (FLOMAX) 0.4 MG CAPS capsule Take 1 capsule (0.4 mg total) by mouth daily. 30 capsule 0  . Thiamine HCl (VITAMIN B-1 PO) Take 1 tablet by mouth daily.    . vitamin A 8000 UNIT capsule Take 8,000 Units by mouth daily.    Marland Kitchen VITAMIN E PO Take 1 tablet by mouth daily.    . pantoprazole (PROTONIX) 40 MG tablet Take 1 tablet by mouth daily as needed. Reported on 12/08/2015     No current facility-administered medications for this encounter.   Facility-Administered Medications Ordered in Other Encounters  Medication Dose Route Frequency Provider Last Rate Last Dose  . midazolam (VERSED) 5 MG/5ML injection    Anesthesia Intra-op Janine Ores, CRNA   2 mg at 08/09/13 1146  . propofol (DIPRIVAN) 10 mg/mL bolus/IV push    Anesthesia Intra-op Janine Ores, CRNA   110 mg at 09/17/15 0842    BP 126/60 mmHg  Pulse 72  Wt 146 lb 8 oz (66.452 kg)  SpO2 95% General: NAD Neck: JVP 6-7 cm, no thyromegaly or thyroid nodule.  Lungs: Clear bilaterally, normal effort CV: Nondisplaced PMI.  Heart regular S1/S2, no S3/S4, 1/6 SEM RUSB.  Trace-1+ edema bilaterally. R CEA scar. Normal pedal pulses.  Abdomen: Soft, NT, ND, no HSM. No bruits or masses. +BS  Skin: Intact without lesions or rashes.  Neurologic: Alert and oriented x 3.  Psych: Normal affect. Extremities: No clubbing or cyanosis.   Assessment/Plan: 1. Chronic systolic CHF: Ischemic cardiomyopathy, EF 35-40% on 8/16 echo. He has peripheral edema but no JVD. NYHA class II symptoms. Volume status looks stable centrally despite LE edema.  Edema may be due to imatinib.  - Continue current losartan 12.5 mg daily and carvedilol 12.5 mg BID. - Increase spiro 25 mg daily. BMET today and x 2 weeks. - Continue Lasix 20 mg daily.   - No ICD given age. EF actually improved on last echo. Now borderline  for indication. - Check BMET/BNP.      - Encouraged to increase exercise, specifically walking more. (Increase up to 25-30 minutes daily 3-4 times weekly. 2. CAD s/p DES to mLAD with overlapping Promus stents.  Previously thought to have an out of hospital MI prior in fall 2014.  Of note, there was residual 50% stenosis just beyond the stent margin.    - Continue ASA and statin.     3. Hyperlipidemia: Continue atorvastatin 40 mg daily. Lipids good 06/2015. 4. CKD: Stable. BMET today. 5. Carotid stenosis: s/p R CEA 09/2015 with Dr Trula Slade.   Shirley Friar PA-C 12/08/2015   Patient seen  with PA, agree with the above note.  He is stable clinically. Still has some ankle swelling but no JVD.  Swelling may be related to imatinib.  I will have him increase spironolactone to 25 mg daily with BMET in 2 wks.   Loralie Champagne 12/09/2015

## 2015-12-13 ENCOUNTER — Other Ambulatory Visit: Payer: Self-pay | Admitting: Cardiology

## 2015-12-13 ENCOUNTER — Other Ambulatory Visit (HOSPITAL_COMMUNITY): Payer: Self-pay | Admitting: *Deleted

## 2015-12-13 MED ORDER — CARVEDILOL 12.5 MG PO TABS: ORAL_TABLET | ORAL | Status: DC

## 2015-12-17 ENCOUNTER — Ambulatory Visit (HOSPITAL_COMMUNITY)
Admission: RE | Admit: 2015-12-17 | Discharge: 2015-12-17 | Disposition: A | Discharge status: Home / Self Care | Payer: Medicare HMO | Source: Ambulatory Visit | Attending: Cardiology | Admitting: Cardiology

## 2015-12-17 DIAGNOSIS — I5022 Chronic systolic (congestive) heart failure: Secondary | ICD-10-CM | POA: Diagnosis present

## 2015-12-17 LAB — BASIC METABOLIC PANEL
Anion gap: 7 (ref 5–15)
BUN: 19 mg/dL (ref 6–20)
CO2: 26 mmol/L (ref 22–32)
Calcium: 7.5 mg/dL — ABNORMAL LOW (ref 8.9–10.3)
Chloride: 108 mmol/L (ref 101–111)
Creatinine, Ser: 1.64 mg/dL — ABNORMAL HIGH (ref 0.61–1.24)
GFR calc Af Amer: 43 mL/min — ABNORMAL LOW (ref >60)
GFR calc non Af Amer: 37 mL/min — ABNORMAL LOW (ref >60)
Glucose, Bld: 93 mg/dL (ref 65–99)
Potassium: 3.6 mmol/L (ref 3.5–5.1)
Sodium: 141 mmol/L (ref 135–145)

## 2015-12-27 ENCOUNTER — Telehealth: Payer: Self-pay | Admitting: Cardiology

## 2015-12-27 NOTE — Telephone Encounter (Signed)
New message      Calling to get most recent lab results

## 2015-12-28 ENCOUNTER — Telehealth (HOSPITAL_COMMUNITY): Payer: Self-pay

## 2015-12-28 NOTE — Telephone Encounter (Signed)
Pt's daughter given lab results

## 2015-12-28 NOTE — Telephone Encounter (Signed)
Patient called to request copy of lab work to be mailed to home. Address on file verifies with patient and copy placed in sealed envelope addressed to patient and given to secretary of CHF clinic to deliver to mail room.  Renee Pain

## 2016-02-03 ENCOUNTER — Telehealth: Payer: Self-pay | Admitting: Cardiology

## 2016-02-03 DIAGNOSIS — I5022 Chronic systolic (congestive) heart failure: Secondary | ICD-10-CM

## 2016-02-03 NOTE — Telephone Encounter (Signed)
New Message:   Please call, she think pt is having a reaction to his Spironolactone,but she said she does not think it  an emergency. She does need you to call and advise asap.

## 2016-02-03 NOTE — Telephone Encounter (Signed)
PT'S  SPIRONOLACTONE WAS  INCREASED TO  WHOLE  TAB  ( 25 MG ) A DAY  AT  LAST OFFICE VISIT.  PER  DAUGHTER PT IS  COMPLAINING  OF  BREAST  SWELLING  AND  TENDERNESS  FEELS  THIS IS  COMING  FROM  MED  CHANGE  .WILL FORWARD TO DR [facility] FOR REVIEW .Adonis Housekeeper

## 2016-02-04 MED ORDER — EPLERENONE 25 MG PO TABS: 25.0000 mg | ORAL_TABLET | Freq: Every day | ORAL | Status: DC

## 2016-02-04 NOTE — Telephone Encounter (Signed)
Left message for patient to call back  

## 2016-02-04 NOTE — Telephone Encounter (Signed)
Spoke with patient's daughter, Bev, who called back regarding medication change.  I reviewed Dr. Claris Gladden advice with her and she verbalized understanding and agreement.  She asked me to call patient to schedule lab appointment.  I advised that new Rx has been sent to patient's pharmacy.  She verbalized understanding and agreement and thanked me for the call.  I spoke with patient regarding lab appointment.  He verbalized understanding and agreement.

## 2016-02-04 NOTE — Telephone Encounter (Signed)
HE NEEDS TO STOP SPIRONOLACTONE AS THIS IS A LIKELY SIDE EFFECT.  AFTER ABOUT 1 WEEK OFF THIS MEDICATION, HE SHOULD START EPLERENONE 25 MG DAILY.  THIS WILL GIVE HIM THE SAME EFFECT ON  HIS HEART AS SPIRONOLACTONE WITHOUT THE BREAST PAIN. HE WILL NEED BMET 1 WEEK AFTER STARTING EPLERENONE.

## 2016-02-11 ENCOUNTER — Other Ambulatory Visit (INDEPENDENT_AMBULATORY_CARE_PROVIDER_SITE_OTHER): Payer: Medicare HMO

## 2016-02-11 DIAGNOSIS — I5022 Chronic systolic (congestive) heart failure: Secondary | ICD-10-CM | POA: Diagnosis not present

## 2016-02-11 LAB — BASIC METABOLIC PANEL
BUN: 17 mg/dL (ref 7–25)
CO2: 24 mmol/L (ref 20–31)
Calcium: 7.5 mg/dL — ABNORMAL LOW (ref 8.6–10.3)
Chloride: 108 mmol/L (ref 98–110)
Creat: 1.57 mg/dL — ABNORMAL HIGH (ref 0.70–1.11)
Glucose, Bld: 104 mg/dL — ABNORMAL HIGH (ref 65–99)
Potassium: 3.2 mmol/L — ABNORMAL LOW (ref 3.5–5.3)
Sodium: 141 mmol/L (ref 135–146)

## 2016-02-14 ENCOUNTER — Telehealth (HOSPITAL_COMMUNITY): Payer: Self-pay | Admitting: *Deleted

## 2016-02-14 DIAGNOSIS — I5022 Chronic systolic (congestive) heart failure: Secondary | ICD-10-CM

## 2016-02-14 MED ORDER — POTASSIUM CHLORIDE CRYS ER 20 MEQ PO TBCR: 20.0000 meq | EXTENDED_RELEASE_TABLET | Freq: Every day | ORAL | Status: DC

## 2016-02-14 NOTE — Telephone Encounter (Signed)
-----   Message from Larey Dresser, MD sent at 02/13/2016 10:54 PM EDT ----- K is low.  Add KCl 20 daily with BMET in 10 days.

## 2016-02-14 NOTE — Telephone Encounter (Signed)
Notes Recorded by Scarlette Calico, RN on 02/14/2016 at 4:56 PM Spoke w/pt, he is aware and agreeable, he states he is currently taking OTC potassium gluconate, he states he started taking this himself because he was on lasix, he will stop now and start on the KCL 20 meq daily, rx sent in, repeat labs 4/7, copy of results mailed to pt per his request

## 2016-02-25 ENCOUNTER — Other Ambulatory Visit (HOSPITAL_COMMUNITY): Payer: Medicare HMO

## 2016-02-28 ENCOUNTER — Other Ambulatory Visit (HOSPITAL_COMMUNITY): Payer: Medicare HMO

## 2016-02-28 ENCOUNTER — Other Ambulatory Visit (INDEPENDENT_AMBULATORY_CARE_PROVIDER_SITE_OTHER): Payer: Medicare HMO | Admitting: *Deleted

## 2016-02-28 DIAGNOSIS — E785 Hyperlipidemia, unspecified: Secondary | ICD-10-CM | POA: Diagnosis not present

## 2016-02-28 LAB — BASIC METABOLIC PANEL
BUN: 22 mg/dL (ref 7–25)
CO2: 22 mmol/L (ref 20–31)
Calcium: 7.9 mg/dL — ABNORMAL LOW (ref 8.6–10.3)
Chloride: 109 mmol/L (ref 98–110)
Creat: 1.65 mg/dL — ABNORMAL HIGH (ref 0.70–1.11)
Glucose, Bld: 123 mg/dL — ABNORMAL HIGH (ref 65–99)
Potassium: 4 mmol/L (ref 3.5–5.3)
Sodium: 140 mmol/L (ref 135–146)

## 2016-03-01 ENCOUNTER — Other Ambulatory Visit (HOSPITAL_COMMUNITY): Payer: Self-pay | Admitting: *Deleted

## 2016-03-06 ENCOUNTER — Telehealth: Payer: Self-pay | Admitting: Cardiology

## 2016-03-06 ENCOUNTER — Telehealth (HOSPITAL_COMMUNITY): Payer: Self-pay | Admitting: *Deleted

## 2016-03-06 MED ORDER — POTASSIUM CHLORIDE CRYS ER 10 MEQ PO TBCR: 20.0000 meq | EXTENDED_RELEASE_TABLET | Freq: Every day | ORAL | Status: DC

## 2016-03-06 NOTE — Telephone Encounter (Signed)
New message      Daughter want to talk to [facility] only----will wait until she returns 1. Potassium pill too big----pt cannot take it and it taste nasty to cut in half. 2.  Daughter does not understand why pt has to go to office at hospital to see Dr Aundra Dubin vs seeing him at [address].  He prefers [address].  Please explain how both offices work 3.  Calling to get lab results when pt had them drawn at the CHF clinic.  She cannot get anyone at CHF clinic to call her

## 2016-03-06 NOTE — Telephone Encounter (Signed)
Cut the eplerenone in half, if this does not help symptoms may try stopping it.

## 2016-03-06 NOTE — Telephone Encounter (Signed)
Pt's daughter Bev called with many concerns today regarding pt.  1. She doesn't understand why pt now comes to this office and wants to go back to [facility]. 2. Pt has been having nausea, dizziness and occ. Vomiting since starting the Epleronone about 3-4 weeks ago 3. Pt has difficulty taking KCL tabs  Discussed all issues with daughter.  Explained that Dr Aundra Dubin is transitioning to this office and is not in the [facility] office much anymore so he has moved his heart failure pt's here.  Will discuss w/Dr Aundra Dubin the nausea and dizziness, she really feels sx are from the Epleronone and sympotms did start right after he started taking it.  Advised can send in 10 meq of KCL and he will have to take 2 of them but they are a little smaller.  After a 15 min conversation and explanation she is feeling much better and is ok with pt continuing to see Dr Aundra Dubin in our office.  Will call her back after discussing nausea/dizziness w/Dr Aundra Dubin.

## 2016-03-07 MED ORDER — EPLERENONE 25 MG PO TABS: 12.5000 mg | ORAL_TABLET | Freq: Every day | ORAL | Status: DC

## 2016-03-07 NOTE — Telephone Encounter (Signed)
Patients daughter aware and voiced understanding Family thought symptoms could be coming from eplerononearone and has not taken x 2 days Advised to hold the rest of the week till symptoms subside, restart Monday at half dose If symptoms return discontinue all together

## 2016-03-10 NOTE — Telephone Encounter (Signed)
See phone note dated 03/06/16

## 2016-03-14 ENCOUNTER — Telehealth (HOSPITAL_COMMUNITY): Payer: Self-pay | Admitting: Cardiology

## 2016-03-14 ENCOUNTER — Other Ambulatory Visit (HOSPITAL_COMMUNITY): Payer: Self-pay | Admitting: Cardiology

## 2016-03-14 MED ORDER — EPLERENONE 25 MG PO TABS: 12.5000 mg | ORAL_TABLET | Freq: Every day | ORAL | Status: DC

## 2016-03-14 NOTE — Telephone Encounter (Signed)
Spoke w/pt's daughter, Dr Aundra Dubin is off Fri so other appts this week, appt sch for next Thur 5/4 at 9:20, she would like to know if pt should/could have labs done prior to the appt, possible bmet and cbc, will send to Dr Aundra Dubin for review

## 2016-03-14 NOTE — Telephone Encounter (Signed)
He had BMET on 4/11, does not need repeat.  Can get CBC when he comes to the office.

## 2016-03-14 NOTE — Telephone Encounter (Signed)
LATE ENTRY Patients daughter called to with multiple concerns 1. Father has been waiting on a copy of labs results to be mailed to him - verified patients address and advised as stated lab results were mailed on 3/27 per Graford Schub.RN. Uncertain why patient has not received copy however I would be more than happy to place another copy in the mail. Daughter then reports they were expecting a copy from his more recent labs done 4/10. i advised labs were stable and it is not our office protocol to mail or notify patients for normal labs. Daughter was very unhappy with office protocol. Reports while patient was seen at the [facility] location, patient received a letter every time. Again advised our office protocol is not to notify every patient with every normal lab, our office does not have the staff-man power at this time and maybe we could work towards this in the future.  Daughter was displeased 2. Father has been c/o increase HA, dizziness, nausea -meds changes were made and family thought symptoms would subside, currently taking antibiotic  Daughter feels as if he could benefit from [place] and review medications Next available for DR.McLEan 5/25  Please advise regarding symptoms

## 2016-03-14 NOTE — Telephone Encounter (Signed)
Let's see if we can work him in with me some time towards the end of next week.

## 2016-03-16 NOTE — Telephone Encounter (Signed)
Left mess for Bev, labs not needed before appt.

## 2016-03-23 ENCOUNTER — Ambulatory Visit (HOSPITAL_COMMUNITY)
Admission: RE | Admit: 2016-03-23 | Discharge: 2016-03-23 | Disposition: A | Discharge status: Home / Self Care | Payer: Medicare HMO | Source: Ambulatory Visit | Attending: Cardiology | Admitting: Cardiology

## 2016-03-23 ENCOUNTER — Encounter (HOSPITAL_COMMUNITY): Payer: Self-pay

## 2016-03-23 VITALS — BP 100/56 | HR 72 | Wt 135.5 lb

## 2016-03-23 DIAGNOSIS — Z833 Family history of diabetes mellitus: Secondary | ICD-10-CM | POA: Diagnosis not present

## 2016-03-23 DIAGNOSIS — I255 Ischemic cardiomyopathy: Secondary | ICD-10-CM | POA: Diagnosis not present

## 2016-03-23 DIAGNOSIS — Z825 Family history of asthma and other chronic lower respiratory diseases: Secondary | ICD-10-CM | POA: Diagnosis not present

## 2016-03-23 DIAGNOSIS — N189 Chronic kidney disease, unspecified: Secondary | ICD-10-CM | POA: Diagnosis not present

## 2016-03-23 DIAGNOSIS — I5022 Chronic systolic (congestive) heart failure: Secondary | ICD-10-CM | POA: Insufficient documentation

## 2016-03-23 DIAGNOSIS — N183 Chronic kidney disease, stage 3 unspecified: Secondary | ICD-10-CM

## 2016-03-23 DIAGNOSIS — I2510 Atherosclerotic heart disease of native coronary artery without angina pectoris: Secondary | ICD-10-CM | POA: Insufficient documentation

## 2016-03-23 DIAGNOSIS — Z87891 Personal history of nicotine dependence: Secondary | ICD-10-CM | POA: Diagnosis not present

## 2016-03-23 DIAGNOSIS — C9210 Chronic myeloid leukemia, BCR/ABL-positive, not having achieved remission: Secondary | ICD-10-CM | POA: Diagnosis not present

## 2016-03-23 DIAGNOSIS — Z79899 Other long term (current) drug therapy: Secondary | ICD-10-CM | POA: Insufficient documentation

## 2016-03-23 DIAGNOSIS — Z7982 Long term (current) use of aspirin: Secondary | ICD-10-CM | POA: Diagnosis not present

## 2016-03-23 DIAGNOSIS — Z955 Presence of coronary angioplasty implant and graft: Secondary | ICD-10-CM | POA: Diagnosis not present

## 2016-03-23 DIAGNOSIS — E785 Hyperlipidemia, unspecified: Secondary | ICD-10-CM | POA: Diagnosis not present

## 2016-03-23 NOTE — Patient Instructions (Signed)
Follow up 4 months with Dr. Aundra Dubin.  Do the following things EVERYDAY: 1) Weigh yourself in the morning before breakfast. Write it down and keep it in a log. 2) Take your medicines as prescribed 3) Eat low salt foods-Limit salt (sodium) to 2000 mg per day.  4) Stay as active as you can everyday 5) Limit all fluids for the day to less than 2 liters

## 2016-03-23 NOTE — Progress Notes (Signed)
Advanced Heart Failure Clinic Note   PCP: Dr. Sallyanne Havers [facility])  82 yo male with history of recurrent small bowel obstruction and CAD with ischemic cardiomyopathy presents for followup.  In 9/14, patient was hospitalized with recurrent abdominal pain from small bowel obstruction.  This was managed conservatively with NG tube/NPO and IV fluid.  In the hospital, the patient developed pulmonary edema and actually ended up being intubated.  Troponin was normal. Echo was done showing EF 35-40% with anteroseptal, anterolateral, and apical akinesis along with moderate AI.  He had no prior known cardiac disease.  It was thought that the patient had had an out of hospital MI at some point then developed pulmonary edema with IVF while NPO.  He was diuresed and extubated.  Creatinine was up to about 1.6 in the hospital so no cardiac cath was done (not thought to be urgent given normal creatinine).   I took him for [facility] in 11/14.  This showed 95% mid LAD stenosis that was treated with 2 overlapping Promus DES.  Echo in 2/15 showed EF 30-35% with normal RV.  Coreg was cut back to 6.25 bid due to bradycardia and fatigue.   Returns today for regular follow up. Had R CEA in 11/16 with Dr Trula Slade. 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 He developed gynecomastia and had to stop spironolactone.  Eplerenone caused nausea/vomiting.  Today, he is doing well.  SBP 90s-120s at home. Mild fatigue when SBP in 90s.  He has been walking about 1 mile/day with no dyspnea. No chest pain.  No orthopnea/PND. Weight is down 11 lbs.   Labs (9/14): K 3.4, creatinine 1.55 => 1.4, LDL 89, HDL 38 Labs (11/14): K 4.9, creatinine 1.6 Labs (1/15): K 4.1, creatinine 1.4 Labs (2/15): LDL 34, HDL 32 Labs (2/16): K 4.5, creatinine 1.5 Labs (8/16): LDL 26, HDL 33 Labs (11/16): K 3.4, creatinine 1.49 Labs (4/17): K 4, creatinine 1.65  PMH: 1. Nephrolithiasis 2. H/o CCY 3. H/o diverticulitis 4. H/o SBO: initially in 1/14 with small bowel  resection, again in 9/14 treated conservatively.  5. Ischemic cardiomyopathy: Echo (9/14) with EF 35-40%, anteroseptal/anterolateral/apical akinesis and moderate AI.  Patient had acute pulmonary edema while hospitalized in 9/14. Echo (2/15) with EF 30-35% with wall motion abnormalities, normal RV size and systolic function, mild MR and mild AI.  6. CKD 7. Aortic insufficiency: mild on last echo 8. CAD: LHC (11/14) with 95% mLAD stenosis treated with 2 overlapping Promus DES.  There was residual 50% stenosis beyond the stent margin.   9. Carotid stenosis: 2/16 carotid dopplers with 80-99% RICA stenosis, A999333 LICA stenosis. CTA neck showed 90% RICA stenosis. Right CEA 10/16.  10. CML: Diagnosed in 2016, on imatinib.   SH: Married (2nd time) with 4 children.  Lives in [place].  Previous heavy smoker.  Retired Chief Financial Officer.   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   ROS: All systems reviewed and negative except as per HPI.   Current Outpatient Prescriptions  Medication Sig Dispense Refill  . Ascorbic Acid (VITAMIN C PO) Take 2,000 mg by mouth daily.     Marland Kitchen aspirin EC 81 MG tablet Take 1 tablet (81 mg total) by mouth daily. 90 tablet 3  . atorvastatin (LIPITOR) 40 MG tablet Take 1 tablet (40 mg total) by mouth daily at 6 PM. 30 tablet 10  . carvedilol (COREG) 12.5 MG tablet TAKE 1/2 TABLET BY MOUTH TWICE DAILY WITH A MEAL 30 tablet 6  . Coenzyme Q10 (COQ10)  200 MG CAPS Take 1 capsule by mouth daily.    . Cyanocobalamin (VITAMIN B 12 PO) Take 1,000 mcg by mouth daily.     . furosemide (LASIX) 20 MG tablet Take 1 tablet (20 mg total) by mouth daily. Take an additional 20mg  daily as needed for swelling 30 tablet 3  . Krill Oil 300 MG CAPS Take 1 capsule by mouth daily.    Marland Kitchen losartan (COZAAR) 25 MG tablet Take 12.5 mg by mouth daily.  0  . NIACIN PO Take 1 tablet by mouth daily.    . pantoprazole (PROTONIX) 40 MG tablet Take 1 tablet by mouth daily as needed. Reported on 12/08/2015    .  potassium chloride SA (K-DUR,KLOR-CON) 10 MEQ tablet Take 2 tablets (20 mEq total) by mouth daily. 60 tablet 6  . Thiamine HCl (VITAMIN B-1 PO) Take 1 tablet by mouth daily.    . vitamin A 8000 UNIT capsule Take 8,000 Units by mouth daily.    Marland Kitchen VITAMIN E PO Take 1 tablet by mouth daily.     No current facility-administered medications for this encounter.   Facility-Administered Medications Ordered in Other Encounters  Medication Dose Route Frequency Provider Last Rate Last Dose  . midazolam (VERSED) 5 MG/5ML injection    Anesthesia Intra-op Janine Ores, CRNA   2 mg at 08/09/13 1146  . propofol (DIPRIVAN) 10 mg/mL bolus/IV push    Anesthesia Intra-op Janine Ores, CRNA   110 mg at 09/17/15 0842    BP 100/56 mmHg  Pulse 72  Wt 135 lb 8 oz (61.462 kg)  SpO2 98% General: NAD Neck: JVP 6-7 cm, no thyromegaly or thyroid nodule.  Lungs: Clear bilaterally, normal effort CV: Nondisplaced PMI.  Heart regular S1/S2, no S3/S4, 1/6 SEM RUSB.  Trace ankle edema bilaterally. R CEA scar. Normal pedal pulses.  Abdomen: Soft, NT, ND, no HSM. No bruits or masses. +BS  Skin: Intact without lesions or rashes.  Neurologic: Alert and oriented x 3.  Psych: Normal affect. Extremities: No clubbing or cyanosis.   Assessment/Plan: 1. Chronic systolic CHF: Ischemic cardiomyopathy, EF 35-40% on 8/16 echo. Volume status looks ok.  NYHA class II symptoms.  - Continue current losartan 12.5 mg daily and carvedilol 6.25 mg BID. I do not think that he will tolerate uptitration of these meds.  - Unable to take spironolactone (gynecomastia) or eplerenone (nausea/vomiting). - Continue Lasix 20 mg daily.   - No ICD given age. EF actually improved on last echo. Now borderline for indication. 2. CAD: S/p DES to mLAD with overlapping Promus stents.  Previously thought to have an out of hospital MI prior in fall 2014.  Of note, there was residual 50% stenosis just beyond the stent margin.    - Continue ASA and  statin.     3. Hyperlipidemia: Continue atorvastatin 40 mg daily. Lipids good 06/2015.  Repeat lipids at followup in 4 months. 4. CKD: Stable.  5. Carotid stenosis: s/p R CEA 09/2015 with Dr Trula Slade.   Loralie Champagne 03/23/2016

## 2016-04-14 ENCOUNTER — Encounter (HOSPITAL_COMMUNITY): Payer: Medicare HMO

## 2016-04-18 ENCOUNTER — Encounter (HOSPITAL_COMMUNITY): Payer: Medicare HMO

## 2016-06-29 ENCOUNTER — Encounter: Payer: Self-pay | Admitting: Surgery

## 2016-07-03 ENCOUNTER — Encounter: Payer: Self-pay | Admitting: Surgery

## 2016-07-03 ENCOUNTER — Ambulatory Visit (INDEPENDENT_AMBULATORY_CARE_PROVIDER_SITE_OTHER): Payer: Medicare HMO | Admitting: Surgery

## 2016-07-03 ENCOUNTER — Ambulatory Visit (HOSPITAL_COMMUNITY)
Admission: RE | Admit: 2016-07-03 | Discharge: 2016-07-03 | Disposition: A | Discharge status: Home / Self Care | Payer: Medicare HMO | Source: Ambulatory Visit | Attending: Surgery | Admitting: Surgery

## 2016-07-03 VITALS — BP 129/63 | HR 61 | Ht 69.5 in | Wt 141.0 lb

## 2016-07-03 DIAGNOSIS — I5022 Chronic systolic (congestive) heart failure: Secondary | ICD-10-CM | POA: Insufficient documentation

## 2016-07-03 DIAGNOSIS — N183 Chronic kidney disease, stage 3 (moderate): Secondary | ICD-10-CM | POA: Insufficient documentation

## 2016-07-03 DIAGNOSIS — I2510 Atherosclerotic heart disease of native coronary artery without angina pectoris: Secondary | ICD-10-CM | POA: Insufficient documentation

## 2016-07-03 DIAGNOSIS — I6523 Occlusion and stenosis of bilateral carotid arteries: Secondary | ICD-10-CM

## 2016-07-03 DIAGNOSIS — E785 Hyperlipidemia, unspecified: Secondary | ICD-10-CM | POA: Diagnosis not present

## 2016-07-03 DIAGNOSIS — Z9889 Other specified postprocedural states: Secondary | ICD-10-CM

## 2016-07-03 LAB — VAS US CAROTID
LEFT ECA DIAS: -20 cm/s
Left CCA dist dias: -26 cm/s
Left CCA dist sys: -100 cm/s
Left CCA prox dias: 32 cm/s
Left CCA prox sys: 120 cm/s
Left ICA dist dias: -37 cm/s
Left ICA dist sys: -99 cm/s
Left ICA prox dias: 34 cm/s
Left ICA prox sys: 104 cm/s
RIGHT CCA MID DIAS: -30 cm/s
RIGHT ECA DIAS: 26 cm/s
Right CCA prox dias: 23 cm/s
Right CCA prox sys: 118 cm/s
Right cca dist sys: -91 cm/s

## 2016-07-03 NOTE — Progress Notes (Signed)
HISTORY AND PHYSICAL     CC:  Follow up Referring Provider:  Drake Leach, MD  HPI: This is a 82 y.o. male who is s/p right carotid endarterectomy with bovine patch angioplasty on 09/17/15 by Dr. Trula Slade.  He states that he has done well since surgery and has not had any amaurosis fugax, speech difficulty, or weakness or paralysis.  His only real complaint today is that he has a productive cough for about 2 months.  He states that the phlegm is clear.  He did call his GP and had a CXR, which he reports was clear.  He is on a new chemo pill.  He and his family state the oncologist is also aware of his cough.    The pt does take a daily aspirin as well as a statin.    Past Medical History:  Diagnosis Date  . Carotid artery occlusion   . Chronic systolic CHF (congestive heart failure) ([place])    a. 07/2013 Echo: EF 35-40%  . CKD (chronic kidney disease), stage III    pt. states that he does not have CKD  . CML (chronic myelocytic leukemia) ([place])   . Coronary artery disease    a. 09/2013 Cath: LM min irregs, LAD 95p(2.75x20 Promus Premier DES & 3.0x20 Promus Premier DES overlapping), D1 50-60, LCX 30p, OM1 59m, OM2 60-70p, RCA small, nl.  . Diverticulitis   . History of hiatal hernia   . History of kidney stones   . History of small bowel obstruction   . Hyperlipidemia   . Ischemic cardiomyopathy    a. 07/2013 Echo: EF 35-40%, apical septal, apical lat, apical AK, mod MR.  . Small bowel perforation ([place])    a. 11/2012 s/p emergent SB resection.    Past Surgical History:  Procedure Laterality Date  . CARDIAC CATHETERIZATION  10/09/2013  . COLON SURGERY  11/2012   BOWEL SURGERY   . CORONARY ANGIOPLASTY  10/09/2013   MID LAD  . ENDARTERECTOMY Right 09/17/2015   Procedure: Right Carotid ENDARTERECTOMY with Xenosure Patch Angioplasty;  Surgeon: Serafina Mitchell, MD;  Location: [facility];  Service: Vascular;  Laterality: Right;  . EYE SURGERY Bilateral   . INGUINAL HERNIA REPAIR Bilateral     . LAPAROSCOPIC CHOLECYSTECTOMY    . LEFT HEART CATHETERIZATION WITH CORONARY ANGIOGRAM N/A 10/09/2013   Procedure: LEFT HEART CATHETERIZATION WITH CORONARY ANGIOGRAM;  Surgeon: Larey Dresser, MD;  Location[facility] C[facility] LAB;  Service: Cardiovascular;  Laterality: N/A;  . PERCUTANEOUS CORONARY STENT INTERVENTION (PCI-S)  10/09/2013   Procedure: PERCUTANEOUS CORONARY STENT INTERVENTION (PCI-S);  Surgeon: Larey Dresser, MD;  Location: [facility] CATH LAB;  Service: Cardiovascular;;    Allergies  Allergen Reactions  . Percocet [Oxycodone-Acetaminophen] Itching  . Metronidazole Other (See Comments)    Insomnia and nervousness    Current Outpatient Prescriptions  Medication Sig Dispense Refill  . Ascorbic Acid (VITAMIN C PO) Take 2,000 mg by mouth daily.     Marland Kitchen aspirin EC 81 MG tablet Take 1 tablet (81 mg total) by mouth daily. 90 tablet 3  . atorvastatin (LIPITOR) 40 MG tablet Take 1 tablet (40 mg total) by mouth daily at 6 PM. 30 tablet 10  . carvedilol (COREG) 12.5 MG tablet TAKE 1/2 TABLET BY MOUTH TWICE DAILY WITH A MEAL 30 tablet 6  . Coenzyme Q10 (COQ10) 200 MG CAPS Take 1 capsule by mouth daily.    . Cyanocobalamin (VITAMIN B 12 PO) Take 1,000 mcg by mouth daily.     Marland Kitchen  furosemide (LASIX) 20 MG tablet Take 1 tablet (20 mg total) by mouth daily. Take an additional 20mg  daily as needed for swelling 30 tablet 3  . Krill Oil 300 MG CAPS Take 1 capsule by mouth daily.    Marland Kitchen losartan (COZAAR) 25 MG tablet Take 12.5 mg by mouth daily.  0  . NIACIN PO Take 1 tablet by mouth daily.    . pantoprazole (PROTONIX) 40 MG tablet Take 1 tablet by mouth daily as needed. Reported on 12/08/2015    . potassium chloride SA (K-DUR,KLOR-CON) 10 MEQ tablet Take 2 tablets (20 mEq total) by mouth daily. 60 tablet 6  . Thiamine HCl (VITAMIN B-1 PO) Take 1 tablet by mouth daily.    . vitamin A 8000 UNIT capsule Take 8,000 Units by mouth daily.    Marland Kitchen VITAMIN E PO Take 1 tablet by mouth daily.     No current  facility-administered medications for this visit.    Facility-Administered Medications Ordered in Other Visits  Medication Dose Route Frequency Provider Last Rate Last Dose  . midazolam (VERSED) 5 MG/5ML injection    Anesthesia Intra-op Janine Ores, CRNA   2 mg at 08/09/13 1146  . propofol (DIPRIVAN) 10 mg/mL bolus/IV push    Anesthesia Intra-op Janine Ores, CRNA   110 mg at 09/17/15 [id number]    Family History  Problem Relation Age of Onset  . Emphysema Father     died @ 52  . Diabetes Mother     died @ 37    Social History   Social History  . Marital status: Married    Spouse name: N/A  . Number of children: N/A  . Years of education: N/A   Occupational History  . Not on file.   Social History Main Topics  . Smoking status: Former Smoker    Packs/day: 1.00    Years: 45.00    Types: Cigarettes    Quit date: 06/09/1999  . Smokeless tobacco: Never Used  . Alcohol use No  . Drug use: No  . Sexual activity: Not Currently   Other Topics Concern  . Not on file   Social History Narrative   Pt lives in [place] with his wife of 14 yrs (second wife).  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  Very active @ home.  Still push mows his yard.     REVIEW OF SYSTEMS:   [X]  denotes positive finding, [ ]  denotes negative finding Cardiac  Comments:  Chest pain or chest pressure:    Shortness of breath upon exertion:    Short of breath when lying flat:    Irregular heart rhythm:        Vascular    Pain in calf, thigh, or hip brought on by ambulation:    Pain in feet at night that wakes you up from your sleep:     Blood clot in your veins:    Leg swelling:         Pulmonary    Oxygen at home:    Productive cough:  x See HPI  Wheezing:         Neurologic    Sudden weakness in arms or legs:     Sudden numbness in arms or legs:     Sudden onset of difficulty speaking or slurred speech:    Temporary loss of vision in one eye:       Problems with dizziness:  Gastrointestinal    Blood in stool:     Vomited blood:         Genitourinary    Burning when urinating:     Blood in urine:        Psychiatric    Major depression:         Hematologic    Bleeding problems:    Problems with blood clotting too easily:        Skin    Rashes or ulcers:        Constitutional    Fever or chills:      PHYSICAL EXAMINATION:  Vitals:   07/03/16 1452 07/03/16 1453  BP: 128/74 129/63  Pulse: 61    Body mass index is 20.52 kg/m.  General:  WDWN in NAD; vital signs documented above Gait: Not observed HENT: WNL, normocephalic Pulmonary: normal non-labored breathing; occasional coarse BS on inspiration bilateral bases Cardiac: regular HR, without  Murmurs, rubs or gallops; without carotid bruits Skin: without rashes; right carotid endarterectomy scar is well healed. Vascular Exam/Pulses:  Right Left  Radial 2+ (normal) 2+ (normal)   Extremities: without ischemic changes, without Gangrene , without cellulitis; without open wounds;  Musculoskeletal: no muscle wasting or atrophy  Neurologic: A&O X 3;  No focal weakness or paresthesias are detected; moving all extremities equally. Psychiatric:  The pt has Normal affect.   Non-Invasive Vascular Imaging:   Carotid Duplex 07/03/16: -Patent right CEA site unchanged from previous exam (mild to moderate increase in velocity at proximal ICA, which appear to be primarily due to change in vessel diameter). -Left ICA 1-39%    Pt meds includes: Statin:  Yes.   Beta Blocker:  Yes.   Aspirin:  Yes.   ACEI:  No. ARB:  Yes.   Other Antiplatelet/Anticoagulant:  No.    ASSESSMENT/PLAN:: 82 y.o. male who is s/p right carotid endarterectomy 09/17/15 by Dr. Trula Slade   -pt is doing well without any neurologic events.  His incision is well healed.  He does continue to have mild numbness along the mandible.   -he does have a productive cough, which he has had for a couple  of months.  He did have a CXR, which he reports was clear.  He is going to call his GP and oncologist to inform them that he does still have this cough and if further tests are warranted. -he will f/u in one year with carotid duplex.  He knows to call us sooner if he has any issues.   Leontine Locket, PA-C [facility] [phone]  Clinic MD:  Pt seen and examined in conjunction with Dr. Trula Slade   VASCULAR QUALITY INITIATIVE FOLLOW UP DATA:  Current smoker: [  ] yes  [  x] no  Living status: [ x ]  Home  [  ] Nursing home  [  ] Homeless    MEDS:  ASA [ x ] yes  [  ] no- [  ] medical reason  [  ] non compliant  STATIN  [x  ] yes  [  ] no- [  ] medical reason  [  ] non compliant  Beta blocker [x  ] yes  [  ] no- [  ] medical reason  [  ] non compliant  ACE inhibitor [  ] yes  [ x ] no- [  ] medical reason  [  ] non compliant  ARB [x]  yes  P2Y12 Antagonist [ x ] none  [  ]  clopidogrel-Plavix  [  ] ticlopidine-Ticlid   [  ] prasugrel-Effient  [  ] ticagrelor- Brilinta    Anticoagulant [x  ] None  [  ] warfarin  [  ] rivaroxaban-Xarelto [  ] dabigatran- Pradaxa  Neurologic event since D/C:  [  ] no  [  ] yes: [  ] eye event  [  ] cortical event  [  ] VB event  [  ] non specific event  [  ] right  [  ] left  [  ] TIA  [  ] stroke  Date:   Modified Rankin Score: 0  MI since D/C: [ x ] no  [  ] troponin only  [  ] EKG or clinical  Cranial nerve injury: [  x] none  [  ] resolved  [  ] persistent  Duplex CEA site: [  ] no  [ x ] yes - PSV= 154  EDV= 53  ICA/CCA ratio:   Stenosis= [  x] <40% [  ] 40-59% [  ] 60-79%  [  ] > 80%  [  ]  Occluded  CEA site re-operation:  [ x ] no   [  ] yes- date of re-op:  CEA site PCI:   [x ] no   [  ] yes- date of PCI:    I agree with the above.  The patient is status post right carotid endarterectomy on 09/17/2015 for asymptomatic stenosis.  He reports no neurologic symptoms.  His incision has healed nicely.  His ultrasound  shows a widely patent endarterectomy site.  There is mild velocity elevation at the proximal extent.  I have recommended follow-up in 1 year for annual surveillance.  Annamarie Major

## 2016-07-04 ENCOUNTER — Other Ambulatory Visit: Payer: Self-pay | Admitting: Cardiology

## 2016-07-19 ENCOUNTER — Other Ambulatory Visit: Payer: Self-pay | Admitting: Surgery

## 2016-07-19 DIAGNOSIS — I6529 Occlusion and stenosis of unspecified carotid artery: Secondary | ICD-10-CM

## 2016-07-27 ENCOUNTER — Other Ambulatory Visit: Payer: Self-pay | Admitting: Cardiology

## 2016-08-17 ENCOUNTER — Ambulatory Visit (INDEPENDENT_AMBULATORY_CARE_PROVIDER_SITE_OTHER): Payer: Medicare HMO | Admitting: Internal Medicine

## 2016-08-17 ENCOUNTER — Encounter: Payer: Self-pay | Admitting: Internal Medicine

## 2016-08-17 VITALS — BP 118/70 | HR 78 | Ht 69.5 in | Wt 142.6 lb

## 2016-08-17 DIAGNOSIS — R059 Cough, unspecified: Secondary | ICD-10-CM | POA: Insufficient documentation

## 2016-08-17 DIAGNOSIS — R05 Cough: Secondary | ICD-10-CM

## 2016-08-17 MED ORDER — FLUTTER DEVI: 0 refills | Status: DC

## 2016-08-17 MED ORDER — PANTOPRAZOLE SODIUM 40 MG PO TBEC: 40.0000 mg | DELAYED_RELEASE_TABLET | Freq: Every day | ORAL | 2 refills | Status: DC

## 2016-08-17 MED ORDER — PREDNISONE 10 MG PO TABS: ORAL_TABLET | ORAL | 0 refills | Status: DC

## 2016-08-17 NOTE — Progress Notes (Signed)
**Note [name]-Identified via Obfuscation** Subjective:    [name], male    DOB: [birth year],    MRN: [id number]  HPI  76 yowm quit smoking 2000 with onset of hoarseness p started [place] rx for CML in [place] summer of 2016     but then started coughing  early summer 2017 referred to pulmonary clinic 08/17/2016 by Dr   [name] [name]   08/17/2016 1st [name] [name] [facility] office visit/ [name] [name]   Chief Complaint  Patient presents with  . Pulmonary Consult    Referred by Dr. Jacqulyn [name]. Pt c/o cough x 3 months, prod with clear sputum. Cough seems esp worse first thing in the am.   cough onset was insidious worse p stirs in am and quiets down overnight and not assoc with sob On zantac bid and no better (record says he was on PPI but pt denies ? Avoided due to chronic diarrhea)  No obvious   day to day or daytime variabilty or assoc excess/ purulent sputum or mucus plugs   or cp or chest tightness, subjective wheeze overt sinus or hb symptoms. No unusual exp hx or h/o childhood pna/ asthma or knowledge of premature birth.  Sleeping ok without nocturnal  or early am exacerbation  of respiratory  c/o's or need for noct saba. Also denies any obvious fluctuation of symptoms with weather or environmental changes or other aggravating or alleviating factors except as outlined above   Current Medications, Allergies, Complete Past Medical History, Past Surgical History, Family History, and Social History were reviewed in [employer] record.             Review of Systems  Constitutional: Positive for unexpected weight change. Negative for activity change, appetite change, chills and fever.  HENT: Positive for rhinorrhea and voice change. Negative for congestion, dental problem, postnasal drip, sneezing, sore throat and trouble swallowing.   Eyes: Negative for visual disturbance.  Respiratory: Positive for cough. Negative for choking and shortness of breath.   Cardiovascular: Negative for chest pain and leg swelling.    Gastrointestinal: Negative for abdominal pain, nausea and vomiting.  Genitourinary: Negative for difficulty urinating.  Musculoskeletal: Positive for arthralgias.  Skin: Negative for rash.  Psychiatric/Behavioral: Negative for behavioral problems and confusion.       Objective:   Physical Exam  amb hoarse wm nad with congested rattling cough   Wt Readings from Last 3 Encounters:  08/17/16 142 lb 9.6 oz (64.7 kg)  07/03/16 141 lb (64 kg)  03/23/16 135 lb 8 oz (61.5 kg)    Vital signs reviewed - note Sats 99% on RA on arrival   HEENT: nl dentition, turbinates, and oropharynx. Nl external ear canals without cough reflex   NECK :  without JVD/Nodes/TM/ nl carotid upstrokes bilaterally   LUNGS: no acc muscle use,  Nl contour chest with minimal bilateral insp / exp rhonchi   CV:  RRR  no s3 or murmur or increase in P2, no edema   ABD:  soft and nontender with nl inspiratory excursion in the supine position. No bruits or organomegaly, bowel sounds nl  MS:  Nl gait/ ext warm without deformities, calf tenderness, cyanosis or clubbing No obvious joint restrictions   SKIN: warm and dry without lesions    NEURO:  alert, approp, nl sensorium with  no motor deficits     I personally reviewed images and agree with radiology impression as follows:  CT  Chest w contast 07/06/16 1.No acute process or explanation for leukocytosis. 2.  Centrilobular and paraseptal emphysema with pleural-parenchymal scarring at the apices. Apparent somewhat more nodular densities at the right apex are favored to be related to scarring. Similarly, an area of pleural-parenchymal soft tissue thickening within the posterior left hemi thorax is favored to be benign. Consider follow-up with chest CT at 3-6 months. 3. No thoracic adenopathy                Assessment & Plan:

## 2016-08-17 NOTE — Patient Instructions (Addendum)
Use the flutter valve as much as you can   Prednisone 10 mg take  4 each am x 2 days,   2 each am x 2 days,  1 each am x 2 days and stop   Pantoprazole (protonix) 40 mg   Take  30-60 min before first meal of the day and zantac take both after supper   bedtime until return to office - this is the best way to tell whether stomach acid is contributing to your problem.    GERD (REFLUX)  is an extremely common cause of respiratory symptoms just like yours , many times with no obvious heartburn at all.    It can be treated with medication, but also with lifestyle changes including elevation of the head of your bed (ideally with 6 inch  bed blocks),  Smoking cessation, avoidance of late meals, excessive alcohol, and avoid fatty foods, chocolate, peppermint, colas, red wine, and acidic juices such as orange juice.  NO MINT OR MENTHOL PRODUCTS SO NO COUGH DROPS   USE SUGARLESS CANDY INSTEAD (Jolley ranchers or Stover's or Life Savers) or even ice chips will also do - the key is to swallow to prevent all throat clearing. NO OIL BASED VITAMINS - use powdered substitutes.  Please schedule a follow up office visit in 2 weeks, sooner if needed

## 2016-08-18 NOTE — Assessment & Plan Note (Addendum)
Onset early summer 2017 while on Gleevac with CT chest 07/16/16 showing only mild emphysema -  rec max rx for gerd/short course pred / flutter valve 08/17/2016 >>>  - 08/17/2016 flutter valve training       Review of common side effects of Gleevac include ILD but not airways dz and note he does not cough reproducibly on insp to suggest any active ILD but I would have no problem with a short trial off of it to establish cause and effect   In meantime ddx for cough: The most common causes of chronic cough in immunocompetent adults include the following: upper airway cough syndrome (UACS), previously referred to as postnasal drip syndrome (PNDS), which is caused by variety of rhinosinus conditions; (2) asthma; (3) GERD; (4) chronic bronchitis from cigarette smoking or other inhaled environmental irritants; (5) nonasthmatic eosinophilic bronchitis; and (6) bronchiectasis.   These conditions, singly or in combination, have accounted for up to 94% of the causes of chronic cough in prospective studies.   Other conditions have constituted no >6% of the causes in prospective studies These have included bronchogenic carcinoma, chronic interstitial pneumonia, sarcoidosis, left ventricular failure, ACEI-induced cough, and aspiration from a condition associated with pharyngeal dysfunction.    Chronic cough is often simultaneously caused by more than one condition. A single cause has been found from 38 to 82% of the time, multiple causes from 18 to 62%. Multiply caused cough has been the result of three diseases up to 42% of the time.   Reviewed with pt and fm: The standardized cough guidelines published in Chest by Lissa Morales in 2006 are still the best available and consist of a multiple step process (up to 12!) , not a single office visit,  and are intended  to address this problem logically,  with an alogrithm dependent on response to empiric treatment at  each progressive step  to determine a specific  diagnosis with  minimal addtional testing needed. Therefore if adherence is an issue or can't be accurately verified,  it's very unlikely the standard evaluation and treatment will be successful here.    Furthermore, response to therapy (other than acute cough suppression, which should only be used short term with avoidance of narcotic containing cough syrups if possible), can be a gradual process for which the patient is not likely to  perceive immediate benefit.  Unlike going to an eye doctor where the best perscription is almost always the first one and is immediately effective, this is almost never the case in the management of chronic cough syndromes. Therefore the patient needs to commit up front to consistently adhere to recommendations  for up to 6 weeks of therapy directed at the likely underlying problem(s) before the response can be reasonably evaluated.   Will start with rx for gerd/ cyclical cough/ allergic mech with just short course pred then regroup in 2 weeks  Need to consider changing coreg to bistolic next as much more beta selective and could have an element of AB  Total time devoted to counseling  = 35/28m review case with pt/ discussion of options/alternatives/ personally creating written instructions  in presence of pt  then going over those specific  Instructions directly with the pt including how to use all of the meds but in particular covering each new medication in detail and the difference between the maintenance/automatic meds and the prns using an action plan format for the latter.

## 2016-08-29 ENCOUNTER — Telehealth: Payer: Self-pay | Admitting: Internal Medicine

## 2016-08-29 NOTE — Telephone Encounter (Signed)
Called and spoke with pts daughter and she stated that she learned today that her father has been taking the protonix BID and the zantac BID.  Per the last OV note with MW---  Pantoprazole (protonix) 40 mg   Take  30-60 min before first meal of the day and zantac take both after supper   bedtime until return to office - this is the best way to tell whether stomach acid is contributing to your problem.    Pt has been having diarrhea since doing this and his daughter feels that he he should maybe go off of the protonix for now to see if the diarrhea gets better.  MW please advise.  Will need to call the pt and his daughter back with MW recs. Please advise. thanks

## 2016-08-30 ENCOUNTER — Other Ambulatory Visit: Payer: Self-pay | Admitting: Cardiology

## 2016-08-30 NOTE — Telephone Encounter (Signed)
Yes try off the protonix and change the zantac to 150 mg after bfast and after supper

## 2016-08-30 NOTE — Telephone Encounter (Signed)
Called pts daughter Bev and lmom to make her aware of MW recs of the medications and how the pt will need to stop the protonix for now and take the zantac BID.  I also called and spoke with pt and he is aware of medication update.  Nothing further is needed.

## 2016-09-04 ENCOUNTER — Encounter: Payer: Self-pay | Admitting: Internal Medicine

## 2016-09-04 ENCOUNTER — Ambulatory Visit (INDEPENDENT_AMBULATORY_CARE_PROVIDER_SITE_OTHER): Payer: Medicare HMO | Admitting: Internal Medicine

## 2016-09-04 VITALS — BP 120/60 | HR 59 | Ht 69.0 in | Wt 139.0 lb

## 2016-09-04 DIAGNOSIS — R05 Cough: Secondary | ICD-10-CM

## 2016-09-04 DIAGNOSIS — R059 Cough, unspecified: Secondary | ICD-10-CM

## 2016-09-04 NOTE — Progress Notes (Signed)
Subjective:    [name], male    DOB: 17-Jan-1930,    MRN: [id number]    Brief patient profile:  58 yowm quit smoking 2000 with onset of hoarseness p started [place] rx for CML in [place] summer of 2016     but then started coughing  early summer 2017 referred to pulmonary clinic 08/17/2016 by Dr   [name]    History of Present Illness  08/17/2016 1st [facility] office visit/ [name] [name]   Chief Complaint  Patient presents with  . Pulmonary Consult    Referred by Dr. [name]. Pt c/o cough x 3 months, prod with clear sputum. Cough seems esp worse first thing in the am.   cough onset was insidious worse p stirs in am and quiets down overnight and not assoc with sob On zantac bid and no better (record says he was on PPI but pt denies ? Avoided due to chronic diarrhea) rec Use the flutter valve as much as you can  Prednisone 10 mg take  4 each am x 2 days,   2 each am x 2 days,  1 each am x 2 days and stop  Pantoprazole (protonix) 40 mg   Take  30-60 min before first meal of the day and zantac take both after supper   bedtime until return to office - this is the best way to tell whether stomach acid is contributing to your problem.   GERD (REFLUX) Please schedule a follow up office visit in 2 weeks, sooner if needed    09/04/2016  f/u ov/Latica Hohmann re:  uacs  since starting gleevac  Chief Complaint  Patient presents with  . Follow-up    Cough is unchanged.  He states that he stopped protonix due to cramps and making his face break out.   drinking ice cold water makes it hard to breath    Really not clear if he took ppi as rec - confused between h2 and ppi   No obvious day to day or daytime variability or assoc excess/ purulent sputum or mucus plugs or hemoptysis or cp or chest tightness, subjective wheeze or overt sinus or hb symptoms. No unusual exp hx or h/o childhood pna/ asthma or knowledge of premature birth.  Sleeping ok without nocturnal  or early am exacerbation  of  respiratory  c/o's or need for noct saba. Also denies any obvious fluctuation of symptoms with weather or environmental changes or other aggravating or alleviating factors except as outlined above   Current Medications, Allergies, Complete Past Medical History, Past Surgical History, Family History, and Social History were reviewed in [employer] record.  ROS  The following are not active complaints unless bolded sore throat, dysphagia, dental problems, itching, sneezing,  nasal congestion or excess/ purulent secretions, ear ache,   fever, chills, sweats, unintended wt loss, classically pleuritic or exertional cp,  orthopnea pnd or leg swelling, presyncope, palpitations, abdominal pain, anorexia, nausea, vomiting, diarrhea  or change in bowel or bladder habits, change in stools or urine, dysuria,hematuria,  rash, arthralgias, visual complaints, headache, numbness, weakness or ataxia or problems with walking or coordination,  change in mood/affect or memory.                        Objective:   Physical Exam  amb hoarse wm nad     09/04/2016      140   08/17/16 142 lb 9.6 oz (64.7 kg)  07/03/16 141 lb (64 kg)  03/23/16 135 lb 8 oz (61.5 kg)    Vital signs reviewed  - Note on arrival 02 sats  96% on RA    HEENT: nl dentition, turbinates, and oropharynx. Nl external ear canals without cough reflex   NECK :  without JVD/Nodes/TM/ nl carotid upstrokes bilaterally   LUNGS: no acc muscle use,  Nl contour chest with minimal bilateral insp / exp rhonchi   CV:  RRR  no s3 or murmur or increase in P2, no edema   ABD:  soft and nontender with nl inspiratory excursion in the supine position. No bruits or organomegaly, bowel sounds nl  MS:  Nl gait/ ext warm without deformities, calf tenderness, cyanosis or clubbing No obvious joint restrictions   SKIN: warm and dry without lesions    NEURO:  alert, approp, nl sensorium with  no motor deficits       I  personally reviewed images and agree with radiology impression as follows:  CT  Chest w contast 07/06/16 1.No acute process or explanation for leukocytosis. 2. Centrilobular and paraseptal emphysema with pleural-parenchymal scarring at the apices. Apparent somewhat more nodular densities at the right apex are favored to be related to scarring. Similarly, an area of pleural-parenchymal soft tissue thickening within the posterior left hemi thorax is favored to be benign. Consider follow-up with chest CT at 3-6 months. 3. No thoracic adenopathy                Assessment & Plan:

## 2016-09-04 NOTE — Patient Instructions (Addendum)
Use the flutter valve as much as you can   Zantac 150 mg one after bfast x one week then add  one after supper too     GERD (REFLUX)  is an extremely common cause of respiratory symptoms just like yours , many times with no obvious heartburn at all.    It can be treated with medication, but also with lifestyle changes including elevation of the head of your bed (ideally with 6 inch  bed blocks),  Smoking cessation, avoidance of late meals, excessive alcohol, and avoid fatty foods, chocolate, peppermint, colas, red wine, and acidic juices such as orange juice.  NO MINT OR MENTHOL PRODUCTS SO NO COUGH DROPS   USE SUGARLESS CANDY INSTEAD (Jolley ranchers or Stover's or Life Savers) or even ice chips will also do - the key is to swallow to prevent all throat clearing. NO OIL BASED VITAMINS - use powdered substitutes.  Please schedule a follow up office visit in 2 weeks, sooner if needed with pfts at [facility]

## 2016-09-09 NOTE — Assessment & Plan Note (Signed)
Onset early summer 2017 while on Gleevac with CT chest 07/16/16 showing only mild emphysema -  rec max rx for gerd/short course pred /  08/17/2016 > no change  - 08/17/2016 flutter valve training   Still taking coreg in low doses and also losartan, both assoc with cough but chronologically at least most assoc with rx with gleevac, first with hoarseness then assoc with daytime cough worse with drinking cold water and this is most typical of Upper airway cough syndrome (previously labeled PNDS) , is  so named because it's frequently impossible to sort out how much is  CR/sinusitis with freq throat clearing (which can be related to primary GERD)   vs  causing  secondary (" extra esophageal")  GERD from wide swings in gastric pressure that occur with throat clearing, often  promoting self use of mint and menthol lozenges that reduce the lower esophageal sphincter tone and exacerbate the problem further in a cyclical fashion.   These are the same pts (now being labeled as having "irritable larynx syndrome" by some cough centers) who not infrequently have a history of having failed to tolerate ace inhibitors,  dry powder inhalers or biphosphonates or report having atypical/extraesophageal reflux symptoms that don't respond to standard doses of PPI  and are easily confused as having aecopd or asthma flares by even experienced allergists/ pulmonologists (myself included).   For now max h2 / gerd diet and return for pfts  I had an extended discussion with the patient reviewing all relevant studies completed to date and  lasting 15 to 20 minutes of a 25 minute visit    Each maintenance medication was reviewed in detail including most importantly the difference between maintenance and prns and under what circumstances the prns are to be triggered using an action plan format that is not reflected in the computer generated alphabetically organized AVS.    Please see instructions for details which were reviewed in  writing and the patient given a copy highlighting the part that I personally wrote and discussed at today's ov.

## 2016-09-14 ENCOUNTER — Other Ambulatory Visit (HOSPITAL_COMMUNITY): Payer: Self-pay | Admitting: *Deleted

## 2016-09-14 MED ORDER — POTASSIUM CHLORIDE CRYS ER 10 MEQ PO TBCR: 20.0000 meq | EXTENDED_RELEASE_TABLET | Freq: Every day | ORAL | 6 refills | Status: DC

## 2016-09-22 ENCOUNTER — Ambulatory Visit (HOSPITAL_COMMUNITY)
Admission: RE | Admit: 2016-09-22 | Discharge: 2016-09-22 | Disposition: A | Discharge status: Home / Self Care | Payer: Medicare HMO | Source: Ambulatory Visit | Attending: Internal Medicine | Admitting: Internal Medicine

## 2016-09-22 ENCOUNTER — Encounter: Payer: Self-pay | Admitting: Internal Medicine

## 2016-09-22 ENCOUNTER — Ambulatory Visit (INDEPENDENT_AMBULATORY_CARE_PROVIDER_SITE_OTHER): Payer: Medicare HMO | Admitting: Internal Medicine

## 2016-09-22 VITALS — BP 118/70 | HR 60 | Ht 69.5 in | Wt 138.0 lb

## 2016-09-22 DIAGNOSIS — R05 Cough: Secondary | ICD-10-CM | POA: Insufficient documentation

## 2016-09-22 DIAGNOSIS — R059 Cough, unspecified: Secondary | ICD-10-CM

## 2016-09-22 DIAGNOSIS — R942 Abnormal results of pulmonary function studies: Secondary | ICD-10-CM | POA: Insufficient documentation

## 2016-09-22 DIAGNOSIS — J449 Chronic obstructive pulmonary disease, unspecified: Secondary | ICD-10-CM | POA: Insufficient documentation

## 2016-09-22 LAB — PULMONARY FUNCTION TEST
DL/VA % pred: 51 %
DL/VA: 2.31 ml/min/mmHg/L
DLCO unc % pred: 39 %
DLCO unc: 12.22 ml/min/mmHg
FEF 25-75 Post: 1.47 L/sec
FEF 25-75 Pre: 1.41 L/sec
FEF2575-%Change-Post: 4 %
FEF2575-%Pred-Post: 87 %
FEF2575-%Pred-Pre: 83 %
FEV1-%Change-Post: 3 %
FEV1-%Pred-Post: 90 %
FEV1-%Pred-Pre: 87 %
FEV1-Post: 2.34 L
FEV1-Pre: 2.26 L
FEV1FVC-%Change-Post: 4 %
FEV1FVC-%Pred-Pre: 95 %
FEV6-%Change-Post: 0 %
FEV6-%Pred-Post: 94 %
FEV6-%Pred-Pre: 94 %
FEV6-Post: 3.23 L
FEV6-Pre: 3.24 L
FEV6FVC-%Change-Post: 0 %
FEV6FVC-%Pred-Post: 104 %
FEV6FVC-%Pred-Pre: 104 %
FVC-%Change-Post: -1 %
FVC-%Pred-Post: 89 %
FVC-%Pred-Pre: 90 %
FVC-Post: 3.31 L
FVC-Pre: 3.34 L
Post FEV1/FVC ratio: 71 %
Post FEV6/FVC ratio: 97 %
Pre FEV1/FVC ratio: 68 %
Pre FEV6/FVC Ratio: 97 %
RV % pred: 137 %
RV: 3.68 L
TLC % pred: 103 %
TLC: 7.14 L

## 2016-09-22 MED ORDER — PREDNISONE 10 MG PO TABS: ORAL_TABLET | ORAL | 0 refills | Status: DC

## 2016-09-22 MED ORDER — ALBUTEROL SULFATE (2.5 MG/3ML) 0.083% IN NEBU
2.5000 mg | INHALATION_SOLUTION | Freq: Once | RESPIRATORY_TRACT | Status: AC
Start: 2016-09-22 — End: 2016-09-22
  Administered 2016-09-22: 2.5 mg via RESPIRATORY_TRACT

## 2016-09-22 NOTE — Progress Notes (Signed)
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 Pulmonary Consult    Referred by Dr. [name]. Pt c/o cough x 3 months, prod with clear sputum. Cough seems esp worse first thing in the am.   cough onset was insidious worse p stirs in am and quiets down overnight and not assoc with sob On zantac bid and no better (record says he was on PPI but pt denies ? Avoided due to chronic diarrhea) rec Use the flutter valve as much as you can  Prednisone 10 mg take  4 each am x 2 days,   2 each am x 2 days,  1 each am x 2 days and stop  Pantoprazole (protonix) 40 mg   Take  30-60 min before first meal of the day and zantac take both after supper   bedtime until return to office - this is the best way to tell whether stomach acid is contributing to your problem.   GERD (REFLUX) Please schedule a follow up office visit in 2 weeks, sooner if needed    09/04/2016  f/u ov/[name] re:  uacs  since starting gleevac  Chief Complaint  Patient presents with  . Follow-up    Cough is unchanged.  He states that he stopped protonix due to cramps and making his face break out.   drinking ice cold water makes it hard to breath   Really not clear if he took ppi as rec - confused between h2 and ppi  rec Use the flutter valve as much as you can  Zantac 150 mg one after bfast x one week then add  one after supper too    GERD diet  Please schedule a follow up office visit in 2 weeks, sooner if needed with pfts at [facility]     09/22/2016  f/u ov/[name] re: hoarseness since started gleevac then persistent UACS/ on  h2's only  Chief Complaint  Patient presents with  . Follow-up    Cough is some better, no longer coughing at night. No new co's today.   now says better p  Prednisone but only for a week where previously reported nothing helped the cough or hoarseness      No obvious day to day or daytime variability or assoc excess/ purulent sputum or mucus plugs or hemoptysis or cp or chest tightness, subjective wheeze or overt sinus or hb symptoms. No unusual exp hx or h/o childhood pna/ asthma or knowledge of premature birth.  Sleeping ok without nocturnal  or early am exacerbation  of respiratory  c/o's or need for noct saba. Also denies any obvious fluctuation of symptoms with weather or environmental changes or other aggravating or alleviating factors except as outlined above   Current Medications, Allergies, Complete Past Medical History, Past Surgical History, Family History, and Social History were reviewed in [employer] record.  ROS  The following are not active complaints unless bolded sore throat, dysphagia, dental problems, itching, sneezing,  nasal congestion or  excess/ purulent secretions, ear ache,   fever, chills, sweats, unintended wt loss, classically pleuritic or exertional cp,  orthopnea pnd or leg swelling, presyncope, palpitations, abdominal pain, anorexia, nausea, vomiting, diarrhea  or change in bowel or bladder habits, change in stools or urine, dysuria,hematuria,  rash, arthralgias, visual complaints, headache, numbness, weakness or ataxia or problems with walking or coordination,  change in mood/affect or memory.                        Objective:   Physical Exam  amb hoarse wm nad  With freq throat clearing and mild variable insp stridor/ transmitted to lower airways   09/22/2016        138  09/04/2016      140   08/17/16 142 lb 9.6 oz (64.7 kg)  07/03/16 141 lb (64 kg)  03/23/16 135 lb 8 oz (61.5 kg)    Vital signs reviewed  -  - Note on arrival  02 sats  97% on RA    HEENT: nl dentition, turbinates, and oropharynx. Nl external ear canals without cough reflex   NECK :  without JVD/Nodes/TM/ nl carotid upstrokes bilaterally   LUNGS: no acc muscle use,  Nl contour chest clear to A and P and no cough on insp/exp   CV:  RRR  no s3 or murmur or increase in P2, no edema   ABD:  soft and nontender with nl inspiratory excursion in the supine position. No bruits or organomegaly, bowel sounds nl  MS:  Nl gait/ ext warm without deformities, calf tenderness, cyanosis or clubbing No obvious joint restrictions   SKIN: warm and dry without lesions    NEURO:  alert, approp, nl sensorium with  no motor deficits       I personally reviewed images and agree with radiology impression as follows:  CT  Chest w contast 07/06/16 1.No acute process or explanation for leukocytosis. 2. Centrilobular and paraseptal emphysema with pleural-parenchymal scarring at the apices. Apparent somewhat more nodular densities at the right apex are favored to be related to scarring. Similarly, an area of pleural-parenchymal soft tissue thickening within the posterior left hemi thorax is favored to be benign. Consider follow-up with chest CT at 3-6 months. 3. No thoracic adenopathy                Assessment & Plan:

## 2016-09-22 NOTE — Patient Instructions (Addendum)
Please see patient coordinator before you leave today  to schedule ENT evaluation   Prednisone 10 mg take  4 each am x 2 days,   2 each am x 2 days,  1 each am x 2 days and stop   Pulmonary follow up can be as needed once you finish up with your ent evaluation if still not better

## 2016-09-23 ENCOUNTER — Encounter: Payer: Self-pay | Admitting: Internal Medicine

## 2016-09-23 NOTE — Assessment & Plan Note (Addendum)
Onset early summer 2017 while on Gleevac with CT chest 07/16/16 showing only mild emphysema -  rec max rx for gerd/short course pred /  08/17/2016 > transient improvement  - 08/17/2016 flutter valve training  - PFT's  09/22/2016 nl exp flows/ pos insp truncation but variable  with DLCO  39 % corrects to 51  % for alv volume         Today he has a classic pseudowheeze / quite hoarse with urge to clear his throat all typical of upper airway cough syndrome  and not able to tol ppi so gerd just being treated with H2 > he does report transient improvement at least once on pred so rec rechallenge with prednisone and ENT eval next   Other concerns are occult asthma/cough variant asthma and eos bronchitis which can both be assoc with nl pfts even while active at time of study  and might be a good candidate for trial of ICS if not for the predominant upper airway symptoms he demonstrated today which are typically aggravated by ICS so will wait on ENT eval first   I had an extended discussion with the patient/wife reviewing all relevant studies completed to date and  lasting 15 to 20 minutes of a 25 minute visit    Each maintenance medication was reviewed in detail including most importantly the difference between maintenance and prns and under what circumstances the prns are to be triggered using an action plan format that is not reflected in the computer generated alphabetically organized AVS.    Please see instructions for details which were reviewed in writing and the patient given a copy highlighting the part that I personally wrote and discussed at today's ov.

## 2016-10-09 ENCOUNTER — Other Ambulatory Visit: Payer: Self-pay | Admitting: Cardiology

## 2016-10-10 ENCOUNTER — Other Ambulatory Visit (HOSPITAL_COMMUNITY): Payer: Self-pay | Admitting: *Deleted

## 2016-10-17 ENCOUNTER — Emergency Department (HOSPITAL_BASED_OUTPATIENT_CLINIC_OR_DEPARTMENT_OTHER)
Admission: EM | Admit: 2016-10-17 | Discharge: 2016-10-17 | Disposition: A | Discharge status: Home / Self Care | Payer: Medicare HMO | Attending: Emergency Medicine | Admitting: Emergency Medicine

## 2016-10-17 ENCOUNTER — Emergency Department (HOSPITAL_BASED_OUTPATIENT_CLINIC_OR_DEPARTMENT_OTHER): Payer: Medicare HMO

## 2016-10-17 ENCOUNTER — Encounter (HOSPITAL_BASED_OUTPATIENT_CLINIC_OR_DEPARTMENT_OTHER): Payer: Self-pay

## 2016-10-17 DIAGNOSIS — I130 Hypertensive heart and chronic kidney disease with heart failure and stage 1 through stage 4 chronic kidney disease, or unspecified chronic kidney disease: Secondary | ICD-10-CM | POA: Diagnosis not present

## 2016-10-17 DIAGNOSIS — R110 Nausea: Secondary | ICD-10-CM | POA: Diagnosis not present

## 2016-10-17 DIAGNOSIS — R140 Abdominal distension (gaseous): Secondary | ICD-10-CM | POA: Diagnosis present

## 2016-10-17 DIAGNOSIS — Z79899 Other long term (current) drug therapy: Secondary | ICD-10-CM | POA: Insufficient documentation

## 2016-10-17 DIAGNOSIS — I5022 Chronic systolic (congestive) heart failure: Secondary | ICD-10-CM | POA: Insufficient documentation

## 2016-10-17 DIAGNOSIS — Z87891 Personal history of nicotine dependence: Secondary | ICD-10-CM | POA: Diagnosis not present

## 2016-10-17 DIAGNOSIS — Z7982 Long term (current) use of aspirin: Secondary | ICD-10-CM | POA: Diagnosis not present

## 2016-10-17 DIAGNOSIS — N183 Chronic kidney disease, stage 3 (moderate): Secondary | ICD-10-CM | POA: Insufficient documentation

## 2016-10-17 DIAGNOSIS — R1084 Generalized abdominal pain: Secondary | ICD-10-CM | POA: Diagnosis not present

## 2016-10-17 DIAGNOSIS — I2510 Atherosclerotic heart disease of native coronary artery without angina pectoris: Secondary | ICD-10-CM | POA: Insufficient documentation

## 2016-10-17 LAB — COMPREHENSIVE METABOLIC PANEL
ALT: 31 U/L (ref 17–63)
AST: 41 U/L (ref 15–41)
Albumin: 3.4 g/dL — ABNORMAL LOW (ref 3.5–5.0)
Alkaline Phosphatase: 120 U/L (ref 38–126)
Anion gap: 7 (ref 5–15)
BUN: 22 mg/dL — ABNORMAL HIGH (ref 6–20)
CO2: 21 mmol/L — ABNORMAL LOW (ref 22–32)
Calcium: 7.8 mg/dL — ABNORMAL LOW (ref 8.9–10.3)
Chloride: 112 mmol/L — ABNORMAL HIGH (ref 101–111)
Creatinine, Ser: 2.28 mg/dL — ABNORMAL HIGH (ref 0.61–1.24)
GFR calc Af Amer: 29 mL/min — ABNORMAL LOW (ref >60)
GFR calc non Af Amer: 25 mL/min — ABNORMAL LOW (ref >60)
Glucose, Bld: 111 mg/dL — ABNORMAL HIGH (ref 65–99)
Potassium: 4.4 mmol/L (ref 3.5–5.1)
Sodium: 140 mmol/L (ref 135–145)
Total Bilirubin: 0.5 mg/dL (ref 0.3–1.2)
Total Protein: 5.6 g/dL — ABNORMAL LOW (ref 6.5–8.1)

## 2016-10-17 LAB — CBC WITH DIFFERENTIAL/PLATELET
Basophils Absolute: 0 10*3/uL (ref 0.0–0.1)
Basophils Relative: 0 %
Eosinophils Absolute: 0.5 10*3/uL (ref 0.0–0.7)
Eosinophils Relative: 6 %
HCT: 36.3 % — ABNORMAL LOW (ref 39.0–52.0)
Hemoglobin: 11.9 g/dL — ABNORMAL LOW (ref 13.0–17.0)
Lymphocytes Relative: 15 %
Lymphs Abs: 1.1 10*3/uL (ref 0.7–4.0)
MCH: 31.8 pg (ref 26.0–34.0)
MCHC: 32.8 g/dL (ref 30.0–36.0)
MCV: 97.1 fL (ref 78.0–100.0)
Monocytes Absolute: 0.8 10*3/uL (ref 0.1–1.0)
Monocytes Relative: 10 %
Neutro Abs: 5 10*3/uL (ref 1.7–7.7)
Neutrophils Relative %: 69 %
Platelets: 166 10*3/uL (ref 150–400)
RBC: 3.74 MIL/uL — ABNORMAL LOW (ref 4.22–5.81)
RDW: 15.5 % (ref 11.5–15.5)
WBC: 7.4 10*3/uL (ref 4.0–10.5)

## 2016-10-17 LAB — URINALYSIS, ROUTINE W REFLEX MICROSCOPIC
Bilirubin Urine: NEGATIVE
Glucose, UA: NEGATIVE mg/dL
Hgb urine dipstick: NEGATIVE
Ketones, ur: NEGATIVE mg/dL
Leukocytes, UA: NEGATIVE
Nitrite: NEGATIVE
Protein, ur: NEGATIVE mg/dL
Specific Gravity, Urine: 1.014 (ref 1.005–1.030)
pH: 5.5 (ref 5.0–8.0)

## 2016-10-17 LAB — LIPASE, BLOOD: Lipase: 35 U/L (ref 11–51)

## 2016-10-17 LAB — I-STAT CG4 LACTIC ACID, ED: Lactic Acid, Venous: 0.46 mmol/L — ABNORMAL LOW (ref 0.5–1.9)

## 2016-10-17 IMAGING — CT CT ABD-PELV W/O CM
2 of 4 series · 15 of 46 positions shown, 17 images · non-contrast
Comparison: Abdominal series [O8] hours today. CT Abdomen and
Pelvis [DATE] and earlier.

CLINICAL DATA: 85-year-old male with abdominal distention and upper
abdominal pain. Initial encounter. Partial history of small bowel
perforation an emergent small bowel resection in [O8].

EXAM:
CT ABDOMEN AND PELVIS WITHOUT CONTRAST
TECHNIQUE: Multidetector CT imaging of the abdomen and pelvis was performed
following the standard protocol without IV contrast.

[Series 2: axial st · axial · 0.83mm/px · z∈[+702,+1122]mm · 12 of 92 slices shown, 14 images]
[im 4/92  soft-tissue]
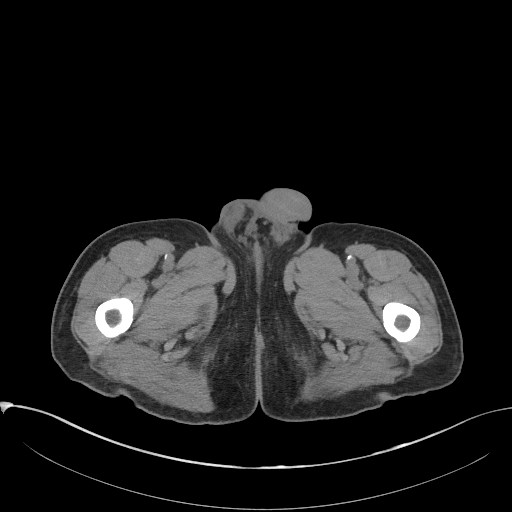
[im 4/92  bone]
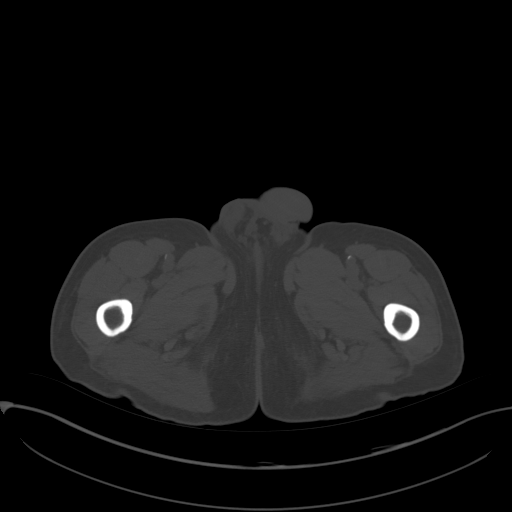
[im 12/92  soft-tissue]
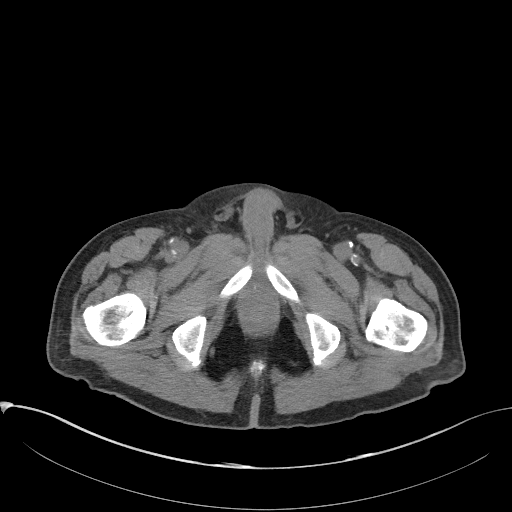
[im 19/92  soft-tissue]
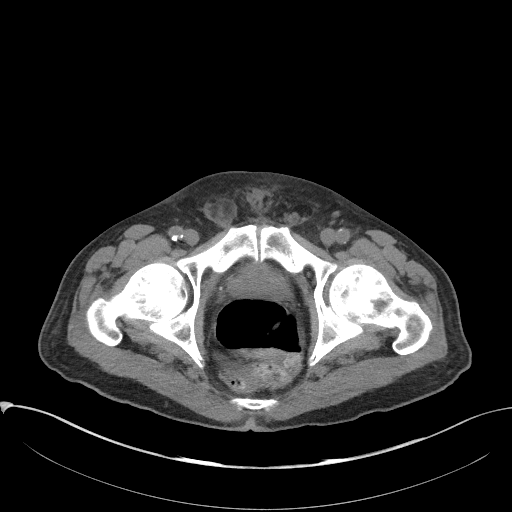
[im 27/92  soft-tissue]
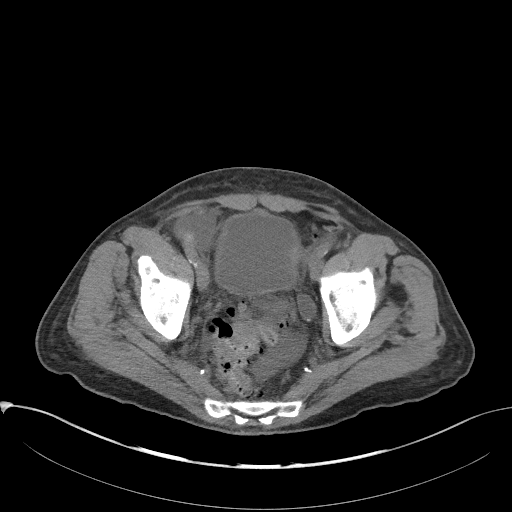
[im 35/92  soft-tissue]
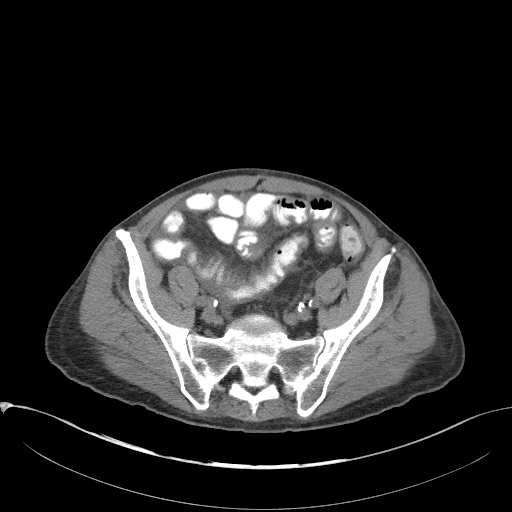
[im 42/92  soft-tissue]
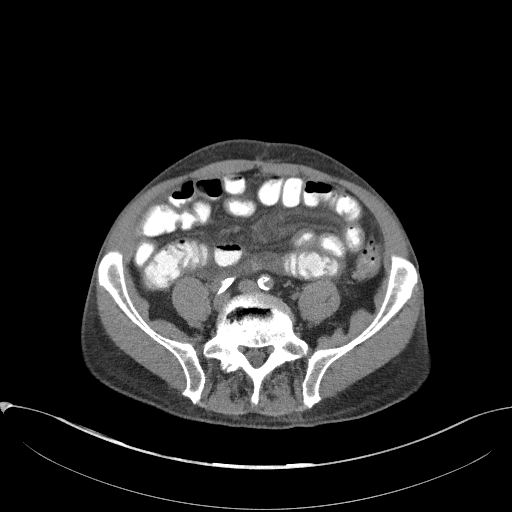
[im 50/92  soft-tissue]
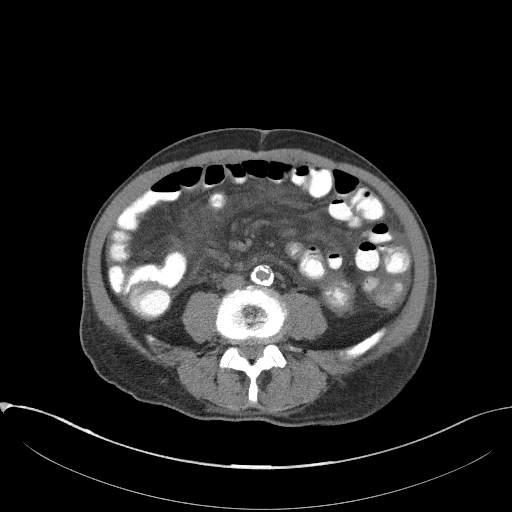
[im 57/92  soft-tissue]
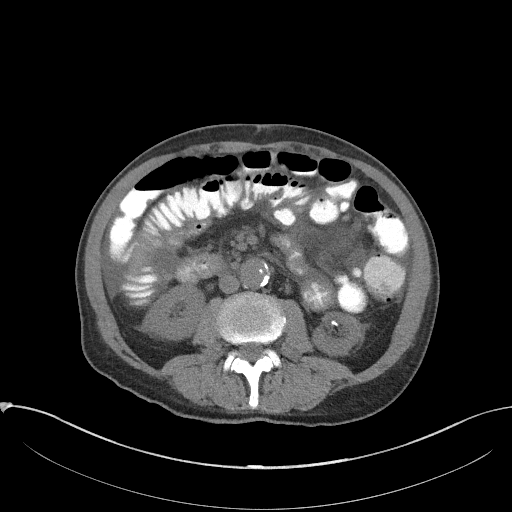
[im 65/92  soft-tissue]
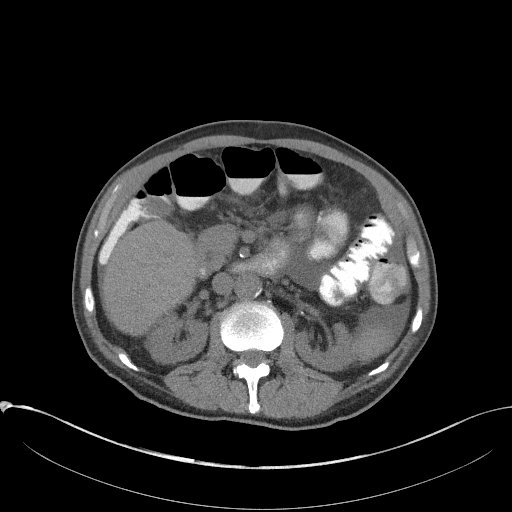
[im 65/92  bone]
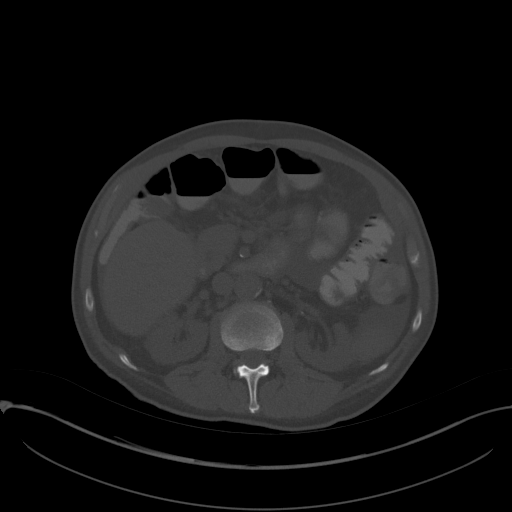
[im 73/92  soft-tissue]
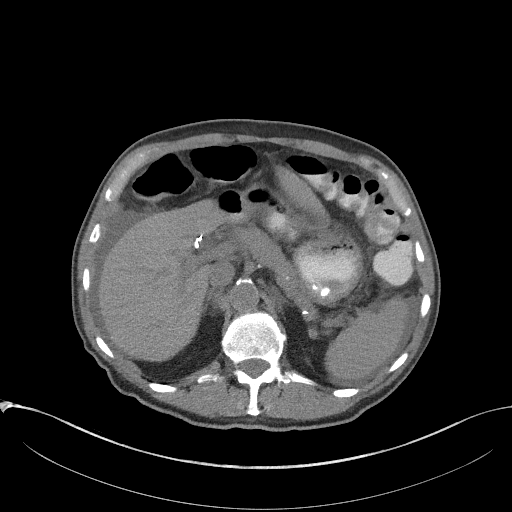
[im 80/92  soft-tissue]
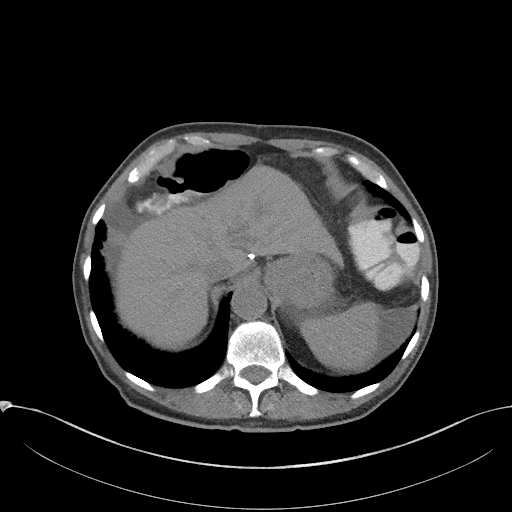
[im 88/92  soft-tissue]
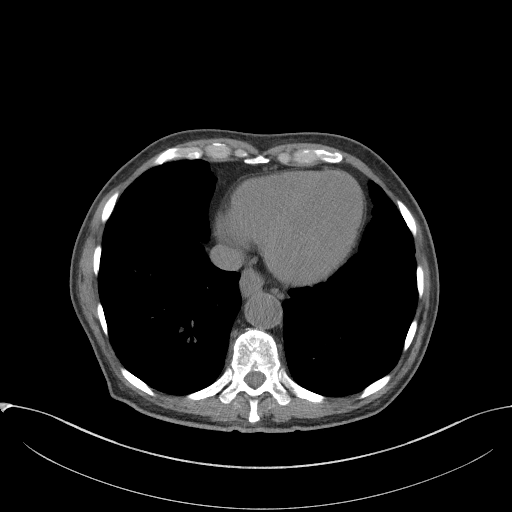

[Series 5: coronal st · coronal · 0.75mm/px · 3 of 86 slices shown]
[im 29/86  soft-tissue]
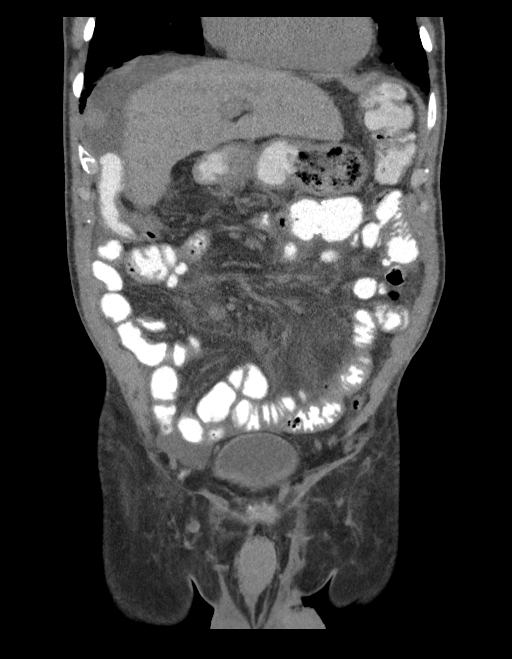
[im 38/86  soft-tissue]
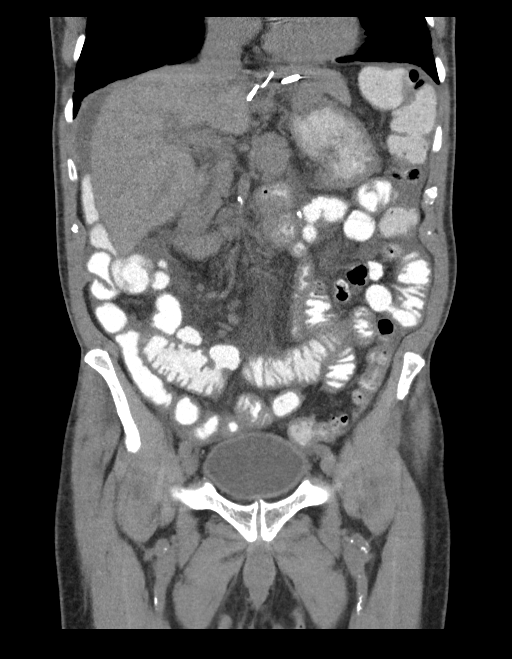
[im 48/86  soft-tissue]
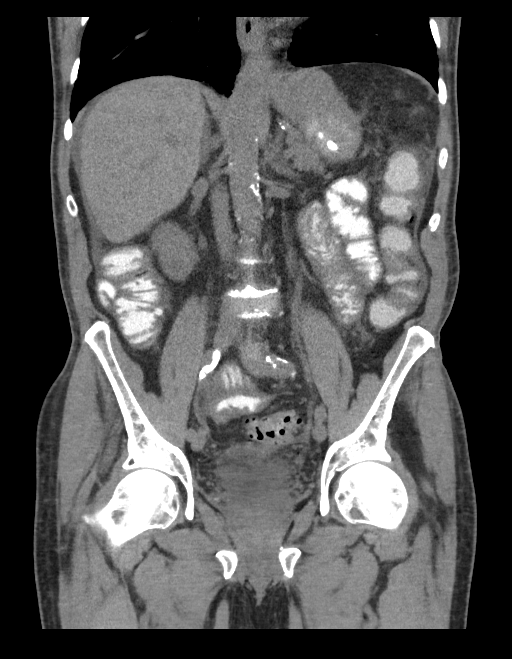

[15 of 46 positions shown; findings below may reference images not displayed]

FINDINGS: Lower chest: Lung base ventilation appears improved since the [O8]
CT. Scarring and centrilobular emphysema at both lung bases. No
pericardial or pleural effusion.

No upper abdominal free air.

Hepatobiliary: Chronic surgical clips at the gastrohepatic ligament.
Stable noncontrast liver. Surgically absent gallbladder.

Pancreas: Negative.

Spleen: Negative.

Adrenals/Urinary Tract: Negative adrenal glands. Chronic left
nephrolithiasis, up to 6 mm. Left renal collecting system appears
stable. Stable pelvic phleboliths. Unremarkable urinary bladder. No
definite acute obstructive uropathy. No right nephrolithiasis.

Stomach/Bowel: Oral contrast was administered in seems to of reached
the rectum. Mild gaseous distension of the rectum. Severe
diverticulosis of the sigmoid colon. Mild diverticulosis of the left
colon. No dilated large bowel. There is a small bowel anastomosis in
the left abdomen on series 5, image 27 with no adverse features.
Extensive small bowel diverticulosis, with individual small bowel
diverticula up to 3 cm diameter. Some small bowel loops are
minimally to mildly dilated up to 3 cm in diameter. It appears the
terminal ileum or small to large bowel transition is in the right
upper quadrant near the tip of the liver. Generalized wall
thickening of the distal small bowel is difficult to exclude (series
2, image 55). Stomach and duodenum appear stable.

Vascular/Lymphatic: Extensive Aortoiliac calcified atherosclerosis
noted. Ectatic infrarenal abdominal aorta up to 28 mm diameter,
stable. Vascular patency not evaluated in the absence of IV
contrast. Calcified atherosclerosis continues into the proximal
femoral arteries.

Mesenteric lymph nodes appear mildly increased, but remain normal by
size criteria.

Reproductive: Negative.

Other: Small volume of free fluid in the abdomen. Small to moderate
volume of free fluid in the pelvis. No pneumoperitoneum identified.

Musculoskeletal: No acute osseous abnormality identified.
IMPRESSION: 1. No bowel obstruction suspected as oral contrast seems to of
reached the rectum. See #4.
2. New nonspecific free fluid in the abdomen and pelvis. No free
air. Query abnormal LFTs or liver disease to explain ascites.
3. Extensive calcified aortic atherosclerosis.
Stable ectasia of the abdominal aorta at risk for aneurysm
development. Recommend followup by ultrasound in 5 years. This
recommendation follows ACR consensus guidelines: White Paper of the
ACR Incidental Findings Committee II on Vascular Findings. [HOSPITAL] [O8]; [DATE].
4. Extensive diverticulosis of small and large bowel. Appearance of
generalized wall thickening in the distal small bowel loops. In
light of #2 and #3, consider infectious enteritis, ischemic
enteritis , as well as reactive small bowel wall thickening
secondary to ascites.

## 2016-10-17 IMAGING — CR DG ABDOMEN 1V
2 series · 2 of 2 positions shown · non-contrast
Comparison: CT abdomen and pelvis [DATE]

CLINICAL DATA: Abdominal pain and distention

EXAM:
ABDOMEN - 1 VIEW

[t abdomen supine (1 of 2)]
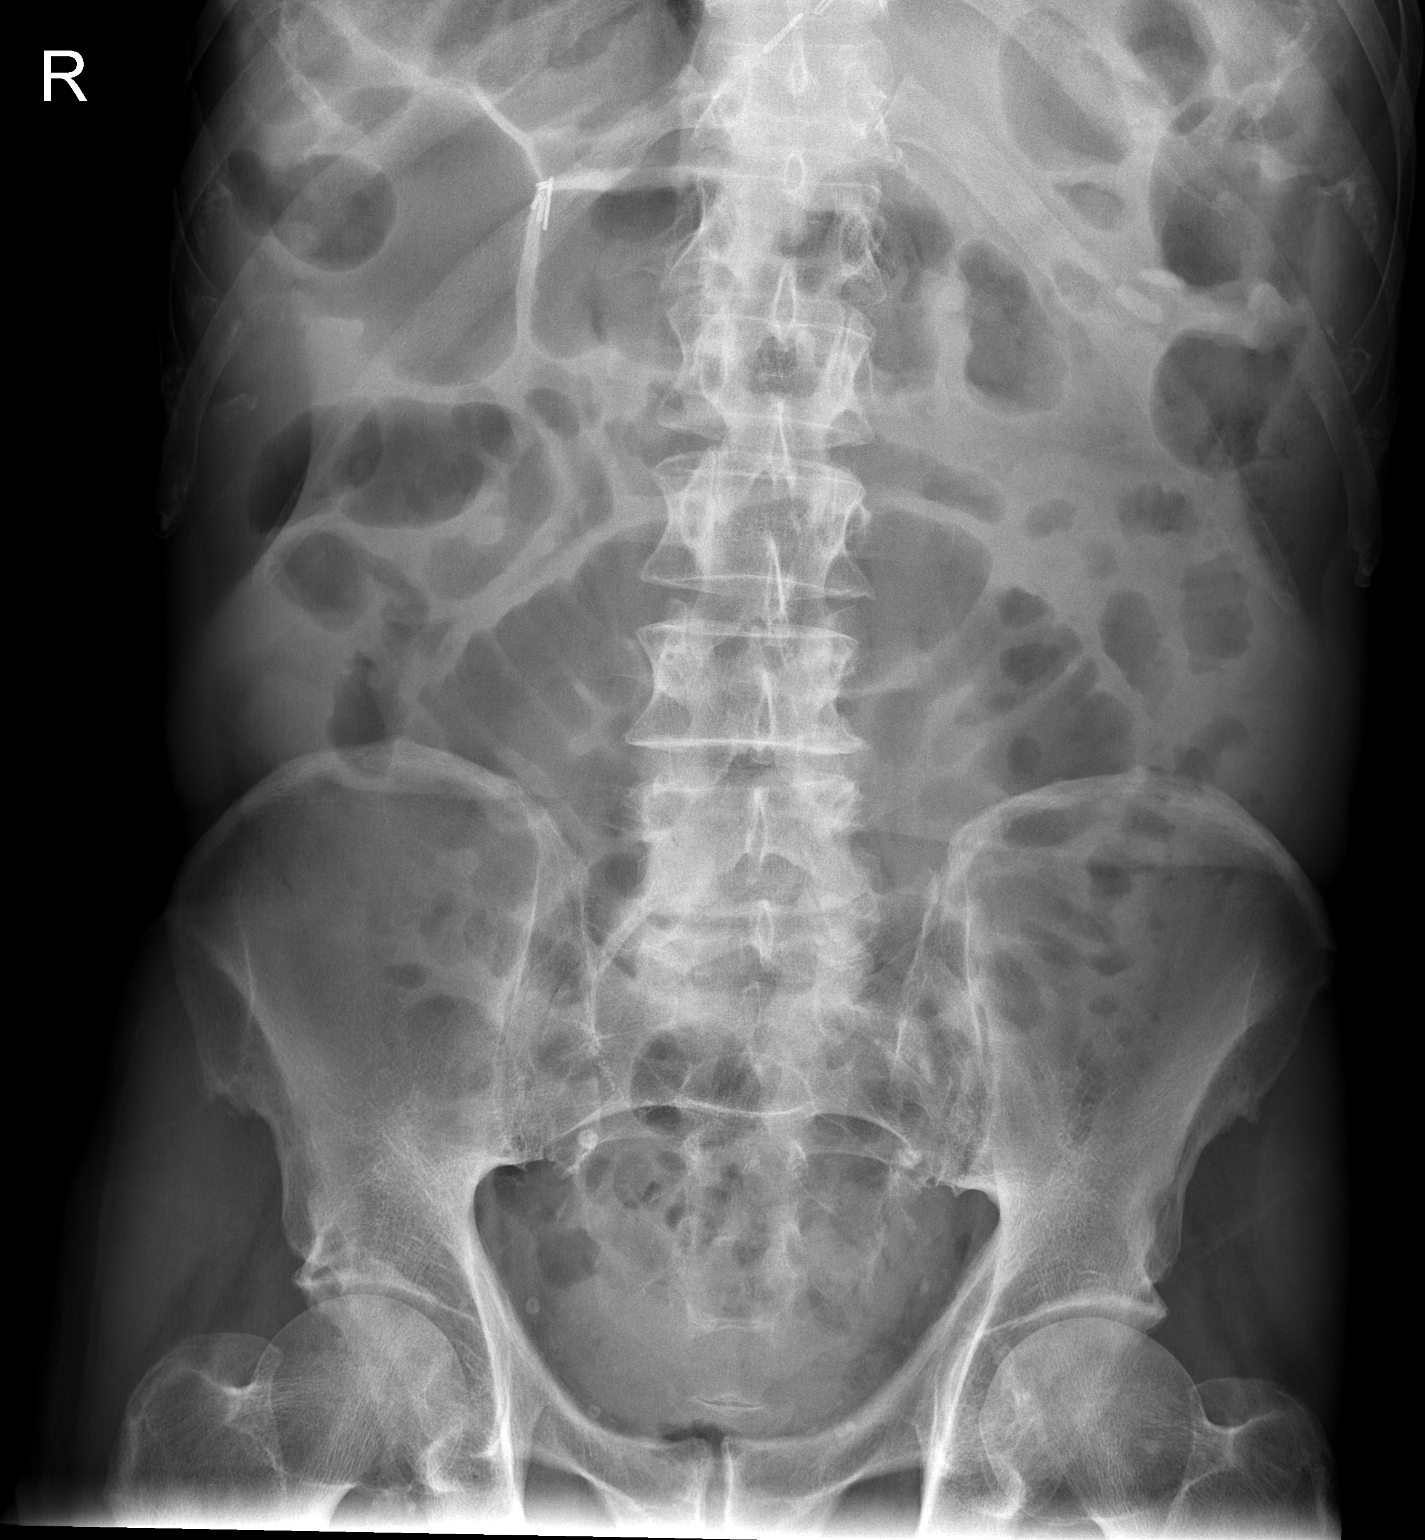

[t abdomen supine (2 of 2)]
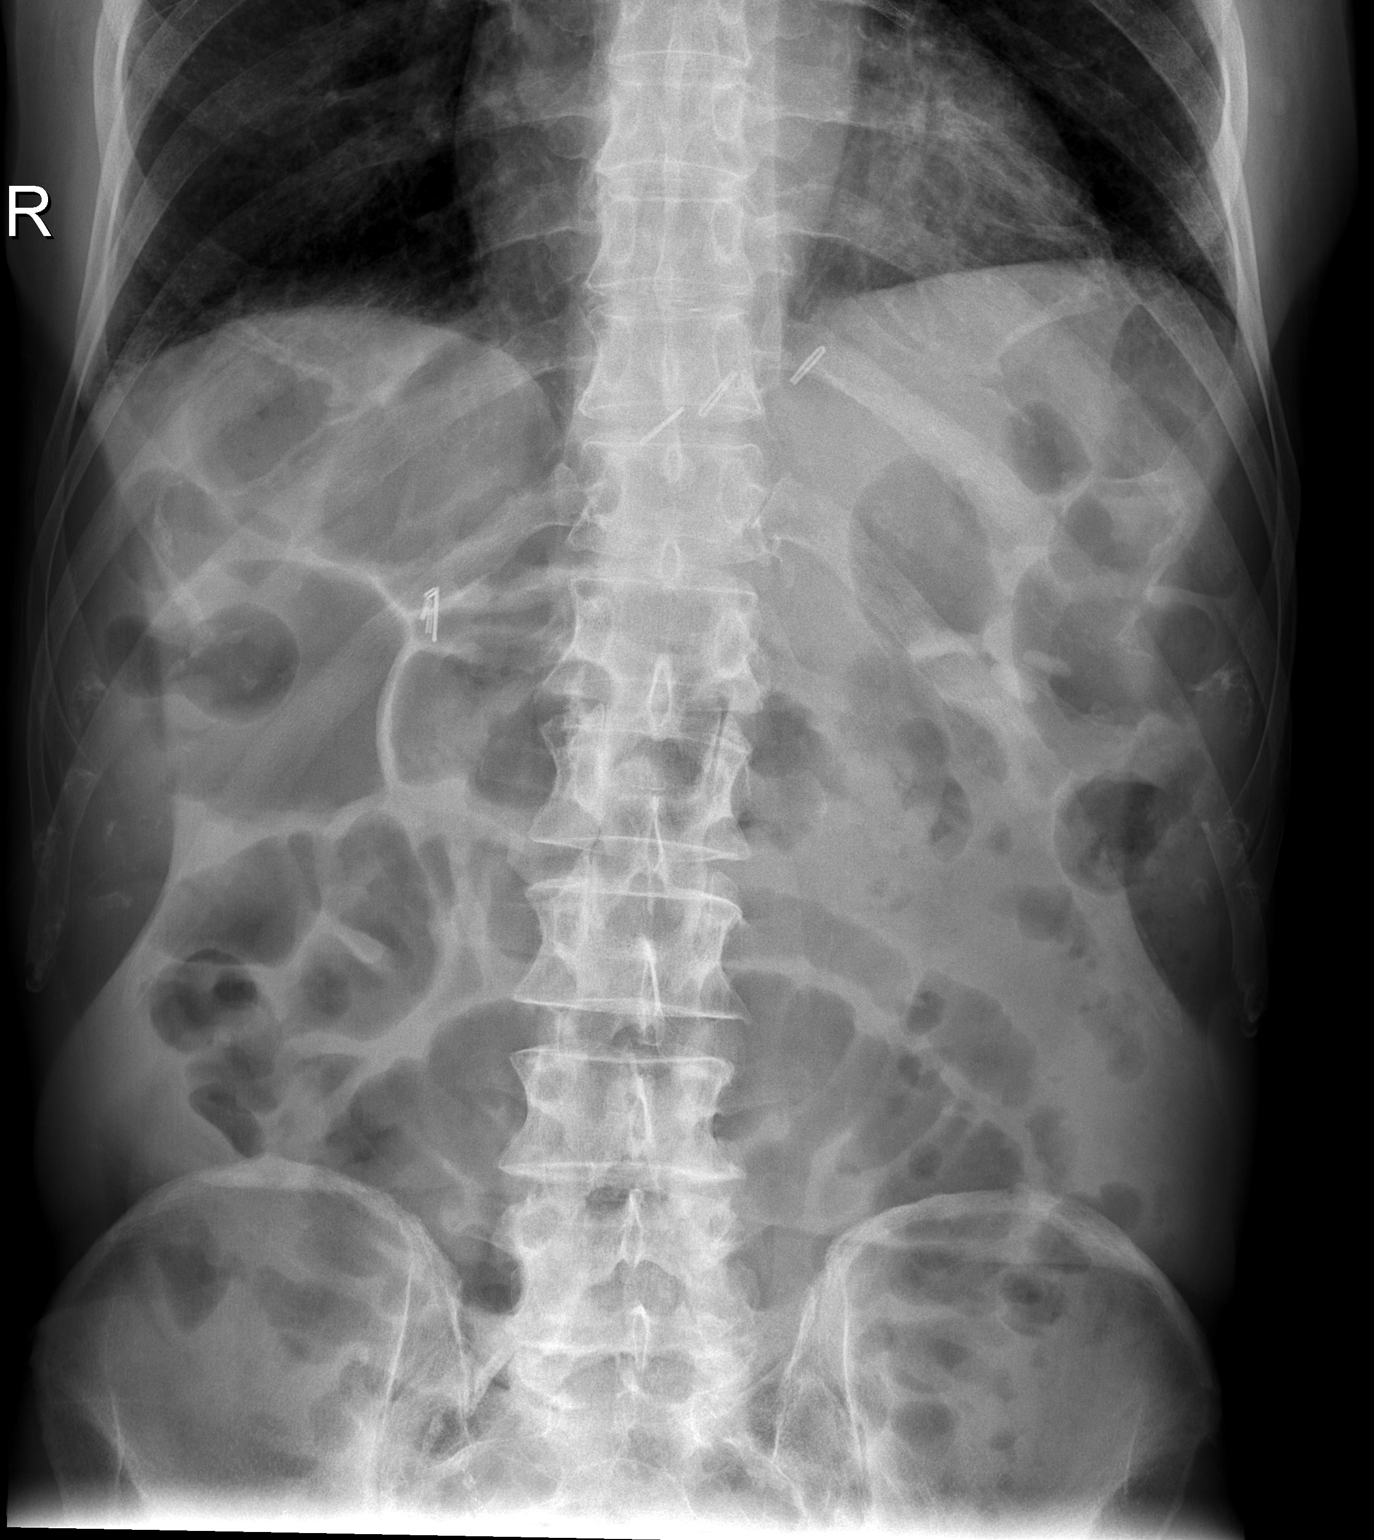

[2 of 2 positions shown; findings below may reference images not displayed]

FINDINGS: There are several loops of mildly dilated bowel without air-fluid
level. No free air. There are surgical clips in the upper abdomen
and right pelvis regions. Visualized lung bases are clear.
IMPRESSION: Loops of mildly dilated bowel without air-fluid levels. Suspect
early ileus or enteritis. Obstruction is possible but felt to be
less likely. No free air evident.

## 2016-10-17 MED ORDER — CIPROFLOXACIN HCL 500 MG PO TABS: 500.0000 mg | ORAL_TABLET | Freq: Once | ORAL | Status: DC

## 2016-10-17 MED ORDER — ONDANSETRON 4 MG PO TBDP: 4.0000 mg | ORAL_TABLET | Freq: Three times a day (TID) | ORAL | 0 refills | Status: DC | PRN

## 2016-10-17 NOTE — ED Notes (Signed)
ED Provider at bedside. 

## 2016-10-17 NOTE — Discharge Instructions (Signed)
Read the information below.  Your labs were remarkable for worsening kidney function. Please be sure to drink fluids and stay hydrated. You need to follow up with your primary provider or kidney specialist for recheck of your kidney function.  Imaging of your abdomen shows: 1)no obstruction, 2)non-specific free fluid in the abdomen, 3) diverticulosis in both the small and large bowel, and 4) inflammation of the distal small bowel.  Please call and follow up with your primary provider in the morning for re-assessment.  I have prescribed zofran for nausea relief.  Use the prescribed medication as directed.  Please discuss all new medications with your pharmacist.   You may return to the Emergency Department at any time for worsening condition or any new symptoms that concern you. Please return to ED if you develop fever, worsening abdominal pain, inability to keep food/fluids down, chest pain, shortness of breath, blood in stool, or any other new/concerning symptoms.

## 2016-10-17 NOTE — ED Notes (Signed)
Pt states the abd pain is gone at this time.

## 2016-10-17 NOTE — ED Provider Notes (Signed)
[facility] DEPT Provider Note   CSN: [id number] Arrival date & time: 10/17/16  1529  By signing my name below, I, Soijett Blue, attest that this documentation has been prepared under the direction and in the presence of Gay Filler, PA-C Electronically Signed: Soijett Blue, ED Scribe. 10/17/16. 4:15 PM.   History   Chief Complaint Chief Complaint  Patient presents with  . Abdominal Pain    HPI [name] [name] is a 82 y.o. male with a PMHx of chronic myelocytic leukemia, small bowel perforation, systolic CHF, CKD stage III who presents to the Emergency Department complaining of intermittent, stabbing, abdominal pain onset 11 AM this morning. Pt notes that he came into the ED for evaluation due to having a bowel obstruction several years ago and he is concerned for that. He states that he is having associated symptoms of abdominal distension, burping, and nausea. He states that he has not tried any medications for the relief of his symptoms. He denies blood in stool, vomiting, flatulence, fever, trouble swallowing, vision change, CP, SOB, cough, leg swelling, rash, dizziness, lightheadedness, dysuria, hematuria, neck pain, back pain, and any other symptoms. Pt notes that he has had abdominal surgeries consisting of 2 Inguinal hernia removals, cholecystectomy, and a perforation repair surgery. Pt notes that he has a hx of CML and he takes a chemotherapy pill daily. Son states that the pt has had a MI in the past and has had two stents placed.     The history is provided by the patient and a relative. No language interpreter was used.    Past Medical History:  Diagnosis Date  . Carotid artery occlusion   . Chronic systolic CHF (congestive heart failure) ([place])    a. 07/2013 Echo: EF 35-40%  . CKD (chronic kidney disease), stage III    pt. states that he does not have CKD  . CML (chronic myelocytic leukemia) ([place])   . Coronary artery disease    a. 09/2013 Cath: LM min irregs, LAD  95p(2.75x20 Promus Premier DES & 3.0x20 Promus Premier DES overlapping), D1 50-60, LCX 30p, OM1 30m, OM2 60-70p, RCA small, nl.  . Diverticulitis   . History of hiatal hernia   . History of kidney stones   . History of small bowel obstruction   . Hyperlipidemia   . Ischemic cardiomyopathy    a. 07/2013 Echo: EF 35-40%, apical septal, apical lat, apical AK, mod MR.  . Small bowel perforation ([place])    a. 11/2012 s/p emergent SB resection.    Patient Active Problem List   Diagnosis Date Noted  . Cough 08/17/2016  . Carotid stenosis 09/17/2015  . Right carotid bruit 12/14/2014  . Unstable angina ([place]) 10/10/2013  . Ischemic cardiomyopathy   . Hyperlipidemia   . Chronic systolic CHF (congestive heart failure) ([place]) 10/03/2013  . CAD (coronary artery disease) 10/03/2013  . Acute systolic heart failure ([place]) 08/11/2013  . Chronic kidney disease (CKD), stage III (moderate) 08/11/2013  . Acute respiratory failure with hypoxia ([place]) 08/09/2013  . Acute pulmonary edema ([place]) 08/09/2013  . Small bowel obstruction 08/09/2013    Past Surgical History:  Procedure Laterality Date  . CARDIAC CATHETERIZATION  10/09/2013  . COLON SURGERY  11/2012   BOWEL SURGERY   . CORONARY ANGIOPLASTY  10/09/2013   MID LAD  . ENDARTERECTOMY Right 09/17/2015   Procedure: Right Carotid ENDARTERECTOMY with Xenosure Patch Angioplasty;  Surgeon: Serafina Mitchell, MD;  Location: [facility];  Service: Vascular;  Laterality: Right;  . EYE SURGERY  Bilateral   . INGUINAL HERNIA REPAIR Bilateral   . LAPAROSCOPIC CHOLECYSTECTOMY    . LEFT HEART CATHETERIZATION WITH CORONARY ANGIOGRAM N/A 10/09/2013   Procedure: LEFT HEART CATHETERIZATION WITH CORONARY ANGIOGRAM;  Surgeon: Larey Dresser, MD;  Location: [facility] CATH LAB;  Service: Cardiovascular;  Laterality: N/A;  . PERCUTANEOUS CORONARY STENT INTERVENTION (PCI-S)  10/09/2013   Procedure: PERCUTANEOUS CORONARY STENT INTERVENTION (PCI-S);  Surgeon: Larey Dresser, MD;  Location:  [facility] CATH LAB;  Service: Cardiovascular;;       Home Medications    Prior to Admission medications   Medication Sig Start Date End Date Taking? Authorizing Provider  Ascorbic Acid (VITAMIN C PO) Take 2,000 mg by mouth daily.     Historical Provider, MD  aspirin EC 81 MG tablet Take 1 tablet (81 mg total) by mouth daily. 10/02/13   Larey Dresser, MD  atorvastatin (LIPITOR) 40 MG tablet TAKE ONE (1) TABLET [facility] DAY AT 6PM Patient not taking: Reported on 09/22/2016 08/30/16   Larey Dresser, MD  carvedilol (COREG) 12.5 MG tablet TAKE 1/2 TABLET TWICE DAILY WITH A MEAL 07/28/16   Larey Dresser, MD  furosemide (LASIX) 20 MG tablet TAKE ONE (1) TABLET EACH DAY MAY TAKE ANADDITIONAL TABLET AS NEEDED Patient taking differently: 1 daily as needed 07/04/16   Larey Dresser, MD  losartan (COZAAR) 25 MG tablet Take 0.5 tablets (12.5 mg total) by mouth daily. 10/09/16   Larey Dresser, MD  NIACIN PO Take 1 tablet by mouth daily.    Historical Provider, MD  ondansetron (ZOFRAN ODT) 4 MG disintegrating tablet Take 1 tablet (4 mg total) by mouth every 8 (eight) hours as needed for nausea or vomiting. 10/17/16   Roxanna Mew, PA-C  potassium chloride (K-DUR,KLOR-CON) 10 MEQ tablet Take 2 tablets (20 mEq total) by mouth daily. 09/14/16   Jolaine Artist, MD  predniSONE (DELTASONE) 10 MG tablet Take  4 each am x 2 days,   2 each am x 2 days,  1 each am x 2 days and stop 09/22/16   Tanda Rockers, MD  Respiratory Therapy Supplies (FLUTTER) DEVI Use as directed 08/17/16   Tanda Rockers, MD    Family History Family History  Problem Relation Age of Onset  . Emphysema Father     died @ 57- smoked  . Diabetes Mother     died @ 73  . Stroke Mother   . Head & neck cancer Brother   . Lymphoma Brother     Social History Social History  Substance Use Topics  . Smoking status: Former Smoker    Packs/day: 1.00    Years: 45.00    Types: Cigarettes    Quit date: 06/09/1999  . Smokeless tobacco:  Never Used  . Alcohol use No     Allergies   Percocet [oxycodone-acetaminophen] and Metronidazole   Review of Systems Review of Systems  Constitutional: Negative for fever.  HENT: Negative for trouble swallowing.   Eyes: Negative for visual disturbance.  Respiratory: Negative for cough and shortness of breath.   Cardiovascular: Negative for chest pain and leg swelling.  Gastrointestinal: Positive for abdominal pain and nausea. Negative for blood in stool, diarrhea and vomiting.  Genitourinary: Negative for dysuria and hematuria.  Musculoskeletal: Negative for back pain and neck pain.  Skin: Negative for rash.  Allergic/Immunologic: Positive for immunocompromised state.  Neurological: Negative for dizziness and light-headedness.     Physical Exam Updated Vital Signs BP 133/79 (BP Location:  Right Arm)   Pulse 73   Temp 98 F (36.7 C) (Oral)   Resp 20   Ht 5\' 9"  (1.753 m)   Wt 141 lb (64 kg)   SpO2 99%   BMI 20.82 kg/m   Physical Exam  Constitutional: He is oriented to person, place, and time. He appears well-developed and well-nourished. No distress.  HENT:  Head: Normocephalic and atraumatic.  Mouth/Throat: Oropharynx is clear and moist. No oropharyngeal exudate.  Eyes: Conjunctivae and EOM are normal. Pupils are equal, round, and reactive to light. Right eye exhibits no discharge. Left eye exhibits no discharge. No scleral icterus.  Neck: Normal range of motion and phonation normal. Neck supple. No neck rigidity. Normal range of motion present.  Cardiovascular: Normal rate, regular rhythm, normal heart sounds and intact distal pulses.  Exam reveals no gallop and no friction rub.   No murmur heard. Pulmonary/Chest: Effort normal and breath sounds normal. No stridor. No respiratory distress. He has no wheezes. He has no rales.  Abdominal: Soft. He exhibits distension. He exhibits no pulsatile midline mass. Bowel sounds are increased. There is no tenderness. There is no  rigidity, no rebound, no guarding and no CVA tenderness.  Mild distension. Hyperactive bowel sounds. Well healed surgical scar. No tenderness.   Musculoskeletal: Normal range of motion.  Lymphadenopathy:    He has no cervical adenopathy.  Neurological: He is alert and oriented to person, place, and time. He is not disoriented. Coordination and gait normal. GCS eye subscore is 4. GCS verbal subscore is 5. GCS motor subscore is 6.  Skin: Skin is warm and dry. He is not diaphoretic.  Psychiatric: He has a normal mood and affect. His behavior is normal.  Nursing note and vitals reviewed.    ED Treatments / Results  DIAGNOSTIC STUDIES: Oxygen Saturation is 99% on RA, nl by my interpretation.    COORDINATION OF CARE: 4:09 PM Discussed treatment plan with pt at bedside which includes abdomen xray, CT abdomen pelvis, KUB, UA, labs - pt agreed to plan.   Labs (all labs ordered are listed, but only abnormal results are displayed) Labs Reviewed  COMPREHENSIVE METABOLIC PANEL - Abnormal; Notable for the following:       Result Value   Chloride 112 (*)    CO2 21 (*)    Glucose, Bld 111 (*)    BUN 22 (*)    Creatinine, Ser 2.28 (*)    Calcium 7.8 (*)    Total Protein 5.6 (*)    Albumin 3.4 (*)    GFR calc non Af Amer 25 (*)    GFR calc Af Amer 29 (*)    All other components within normal limits  CBC WITH DIFFERENTIAL/PLATELET - Abnormal; Notable for the following:    RBC 3.74 (*)    Hemoglobin 11.9 (*)    HCT 36.3 (*)    All other components within normal limits  I-STAT CG4 LACTIC ACID, ED - Abnormal; Notable for the following:    Lactic Acid, Venous 0.46 (*)    All other components within normal limits  LIPASE, BLOOD  URINALYSIS, ROUTINE W REFLEX MICROSCOPIC (NOT AT [facility])   Radiology Ct Abdomen Pelvis Wo Contrast  Result Date: 10/17/2016 CLINICAL DATA:  82 year old male with abdominal distention and upper abdominal pain. Initial encounter. Partial history of small bowel  perforation an emergent small bowel resection in 2014. EXAM: CT ABDOMEN AND PELVIS WITHOUT CONTRAST TECHNIQUE: Multidetector CT imaging of the abdomen and pelvis was performed following the  standard protocol without IV contrast. COMPARISON:  Abdominal series 1628 hours today. CT Abdomen and Pelvis 09/06/2015 and earlier. FINDINGS: Lower chest: Lung base ventilation appears improved since the 2016 CT. Scarring and centrilobular emphysema at both lung bases. No pericardial or pleural effusion. No upper abdominal free air. Hepatobiliary: Chronic surgical clips at the gastrohepatic ligament. Stable noncontrast liver. Surgically absent gallbladder. Pancreas: Negative. Spleen: Negative. Adrenals/Urinary Tract: Negative adrenal glands. Chronic left nephrolithiasis, up to 6 mm. Left renal collecting system appears stable. Stable pelvic phleboliths. Unremarkable urinary bladder. No definite acute obstructive uropathy. No right nephrolithiasis. Stomach/Bowel: Oral contrast was administered in seems to of reached the rectum. Mild gaseous distension of the rectum. Severe diverticulosis of the sigmoid colon. Mild diverticulosis of the left colon. No dilated large bowel. There is a small bowel anastomosis in the left abdomen on series 5, image 27 with no adverse features. Extensive small bowel diverticulosis, with individual small bowel diverticula up to 3 cm diameter. Some small bowel loops are minimally to mildly dilated up to 3 cm in diameter. It appears the terminal ileum or small to large bowel transition is in the right upper quadrant near the tip of the liver. Generalized wall thickening of the distal small bowel is difficult to exclude (series 2, image 55). Stomach and duodenum appear stable. Vascular/Lymphatic: Extensive Aortoiliac calcified atherosclerosis noted. Ectatic infrarenal abdominal aorta up to 28 mm diameter, stable. Vascular patency not evaluated in the absence of IV contrast. Calcified atherosclerosis  continues into the proximal femoral arteries. Mesenteric lymph nodes appear mildly increased, but remain normal by size criteria. Reproductive: Negative. Other: Small volume of free fluid in the abdomen. Small to moderate volume of free fluid in the pelvis. No pneumoperitoneum identified. Musculoskeletal: No acute osseous abnormality identified. IMPRESSION: 1. No bowel obstruction suspected as oral contrast seems to of reached the rectum. See #4. 2. New nonspecific free fluid in the abdomen and pelvis. No free air. Query abnormal LFTs or liver disease to explain ascites. 3. Extensive calcified aortic atherosclerosis. Stable ectasia of the abdominal aorta at risk for aneurysm development. Recommend followup by ultrasound in 5 years. This recommendation follows ACR consensus guidelines: White Paper of the ACR Incidental Findings Committee II on Vascular Findings. J Am Coll Radiol 2013; 10:789-794. 4. Extensive diverticulosis of small and large bowel. Appearance of generalized wall thickening in the distal small bowel loops. In light of #2 and #3, consider infectious enteritis, ischemic enteritis , as well as reactive small bowel wall thickening secondary to ascites. Electronically Signed   By: Genevie Ann M.D.   On: 10/17/2016 19:24   Dg Abdomen 1 View  Result Date: 10/17/2016 CLINICAL DATA:  Abdominal pain and distention EXAM: ABDOMEN - 1 VIEW COMPARISON:  CT abdomen and pelvis September 06, 2015 FINDINGS: There are several loops of mildly dilated bowel without air-fluid level. No free air. There are surgical clips in the upper abdomen and right pelvis regions. Visualized lung bases are clear. IMPRESSION: Loops of mildly dilated bowel without air-fluid levels. Suspect early ileus or enteritis. Obstruction is possible but felt to be less likely. No free air evident. Electronically Signed   By: Lowella Grip III M.D.   On: 10/17/2016 16:32    Procedures Procedures (including critical care time)  Medications  Ordered in ED Medications - No data to display  Vitals:   10/17/16 2023 10/17/16 2114 10/17/16 2115 10/17/16 2202  BP: 121/63 110/70 115/72 (!) 111/51  Pulse: 63 61 62 68  Resp: 20 16 16  16  Temp:   98.3 F (36.8 C)   TempSrc:   Oral   SpO2: 95% 96% 97% 97%  Weight:      Height:        Initial Impression / Assessment and Plan / ED Course  I have reviewed the triage vital signs and the nursing notes.  Pertinent labs & imaging results that were available during my care of the patient were reviewed by me and considered in my medical decision making (see chart for details).  Clinical Course as of Oct 18 957  Tue Oct 17, 2016  1638 Dilation of bowel loops appreciated.  DG Abdomen 1 View [AM]  1950 Reviewed CT Abdomen Pelvis Wo Contrast [AM]  2000 On re-evaluation patient endorses no abdominal pain or nausea and states he has not had any pain for "hours." +BS, abdomen is soft and non-tender.   [AM]    Clinical Course User Index [AM] Roxanna Mew, PA-C    Patient presents to ED with complaint of abdominal distension, nausea, and pain onset today. Patient is afebrile and non-toxic appearing in NAD. VSS. Lungs CTABL. Heart RRR. Distal pulses intact. No lower extremity swelling. Mild abdominal distension and hyperactive bowel sounds, no tenderness to palpation. Given h/o SBO concern for possible obstruction. Pt NPO currently. Will check basic labs and KUB.   KUB remarkable for dilatated bowel loops - ?ileus vs. ?enteritis. Will check CT abd/pelvis w/o IV contrast given h/o CKD.   No bowel obstruction noted on CT abd/pelvis; however, of note - 1)non-specific free fluid in abd/pelvis, LFTs and bili nml, low suspicion for liver disease as culprit - unclear cause at this time; 2) calcified aortic atherosclerosis and stable ectasia - f/u US in 5 yrs; 3) diverticulosis through small and large bowel; 4) generalized small bowel thickening - ?infectious; however, afebrile and no leukocytosis  vs. ?ischemic - pt hemodynamically stable, benign initial and repeat abdominal exam, lactic acid nml vs. ?reactive secondary to non-specific free fluid.  Labs delayed due to on-site lab being closed. No leukocytosis. Anemia is improved compared to previous labs. Lipase nml - low suspicion for pancreatitis. U/A negative for UTI. AKI present - ?dehydration, pt on fluid pills, otherwise CMP grossly unremarkable.  On re-evaluation patient endorses resolution of abdominal pain stating "I haven't had any pain for hours." His abdomen remains soft and non-tender on exam. Patient requesting to go home. VSS remain stable. He is non-toxic, non-septic appearing. Discussed results with patient. In light of initial complaint and worsening kidney function offered admission for rehydration and serial abdominal exams. Patient politely declined admission. Discussed importance of hydration and close follow up with PCP for re-evaluation, re-assessment of creatinine function, and further evaluation of non-specific free fluid in abdomen. Encouraged f/u with PCP in AM. Pt tolerating fluids in ED. Rx zofran for nausea relief. Strict return precautions discussed and patient and family ad bedside voiced understanding. Pt voiced understanding and is agreeable. Shared visit with Dr. Laverta Baltimore, who also evaluated patient, agrees with plan.   Final Clinical Impressions(s) / ED Diagnoses   Final diagnoses:  Generalized abdominal pain  Nausea    New Prescriptions Discharge Medication List as of 10/17/2016  9:59 PM     I personally performed the services described in this documentation, which was scribed in my presence. The recorded information has been reviewed and is accurate.     Macario Golds [place], [place] 10/18/16 [place], MD 10/18/16 1037

## 2016-10-17 NOTE — ED Notes (Signed)
ED Provider at bedside discussing test results and dispo plan of care. 

## 2016-10-17 NOTE — ED Triage Notes (Signed)
C/o abd pain, nausea x today-NAD-steady gait 

## 2016-12-11 ENCOUNTER — Other Ambulatory Visit: Payer: Self-pay | Admitting: Cardiology

## 2016-12-13 ENCOUNTER — Encounter: Payer: Self-pay | Admitting: Gastroenterology

## 2017-01-08 DIAGNOSIS — R490 Dysphonia: Secondary | ICD-10-CM

## 2017-01-08 DIAGNOSIS — J381 Polyp of vocal cord and larynx: Secondary | ICD-10-CM

## 2017-01-08 HISTORY — DX: Polyp of vocal cord and larynx: J38.1

## 2017-01-08 HISTORY — DX: Dysphonia: R49.0

## 2017-01-18 ENCOUNTER — Encounter: Payer: Self-pay | Admitting: Gastroenterology

## 2017-01-18 ENCOUNTER — Ambulatory Visit (INDEPENDENT_AMBULATORY_CARE_PROVIDER_SITE_OTHER): Payer: Medicare HMO | Admitting: Gastroenterology

## 2017-01-18 ENCOUNTER — Telehealth: Payer: Self-pay | Admitting: Gastroenterology

## 2017-01-18 ENCOUNTER — Encounter (INDEPENDENT_AMBULATORY_CARE_PROVIDER_SITE_OTHER): Payer: Self-pay

## 2017-01-18 VITALS — BP 118/54 | HR 72 | Ht 69.0 in | Wt 146.1 lb

## 2017-01-18 DIAGNOSIS — R935 Abnormal findings on diagnostic imaging of other abdominal regions, including retroperitoneum: Secondary | ICD-10-CM

## 2017-01-18 DIAGNOSIS — K529 Noninfective gastroenteritis and colitis, unspecified: Secondary | ICD-10-CM

## 2017-01-18 DIAGNOSIS — K5730 Diverticulosis of large intestine without perforation or abscess without bleeding: Secondary | ICD-10-CM | POA: Diagnosis not present

## 2017-01-18 NOTE — Telephone Encounter (Signed)
Caryl Pina, I see where half of this message got sent to you. Please let Dr. Havery Moros address this too. Thanks.

## 2017-01-18 NOTE — Telephone Encounter (Signed)
And they want to know if Ensure will cause diarrhea

## 2017-01-18 NOTE — Progress Notes (Signed)
HPI :  83 y/o male with history of CML, CHF, CAD s/p stent placement  here for new patient visit for diarrhea.   He reports being diagnosed with CML in 2014, and was placed on Gleevac. Since being on this regimen he has had chronic loose stools. He states on average he has about 5-8 BMs per day chronically. He was placed on benefiber and it got a bit better.  He was placed on omeprazole and things got a bit worse. Symptoms seem to come and go. He has since stopped prilosec and benefiber but symptoms persist. No blood in the stools. No abdominal pains. He denies any weight loss over the past year.   He reports having diverticulitis in 2013, with a prior small bowel resection as well at some point in time. When I look at his chart from prior admissions there is referral to a partial left colon resection for diverticulitis, he denies having colon surgery. He was admitted in Sept 2014 for small bowel obstruction of unclear etiology. He is not sure if he has had improvement in symptoms with antibiotics historically. CT scan done on 10/17/16 as below.   No prior colonoscopy. He has had a prior flex sig he thinks 30 years ago.   Last echo 07/19/15 - EF 35-40%  CT abdomen 11/28 - left sided diverticulosis, small bowel bowel diverticulosis, some up to 3cm in diameter. Some mild thickening of the small bowel as well.   CT scan 10/2012 - sigmoid diverticulitis     Past Medical History:  Diagnosis Date  . Carotid artery occlusion   . Chronic systolic CHF (congestive heart failure) ([place])    a. 07/2013 Echo: EF 35-40%  . CKD (chronic kidney disease), stage III    pt. states that he does not have CKD  . CML (chronic myelocytic leukemia) ([place])   . Coronary artery disease    a. 09/2013 Cath: LM min irregs, LAD 95p(2.75x20 Promus Premier DES & 3.0x20 Promus Premier DES overlapping), D1 50-60, LCX 30p, OM1 66m, OM2 60-70p, RCA small, nl.  . Diverticulitis   . History of hiatal hernia   . History of  kidney stones   . History of small bowel obstruction   . Hyperlipidemia   . Ischemic cardiomyopathy    a. 07/2013 Echo: EF 35-40%, apical septal, apical lat, apical AK, mod MR.  . Small bowel perforation ([place])    a. 11/2012 s/p emergent SB resection.     Past Surgical History:  Procedure Laterality Date  . CARDIAC CATHETERIZATION  10/09/2013  . COLON SURGERY  11/2012   BOWEL SURGERY   . CORONARY ANGIOPLASTY  10/09/2013   MID LAD  . ENDARTERECTOMY Right 09/17/2015   Procedure: Right Carotid ENDARTERECTOMY with Xenosure Patch Angioplasty;  Surgeon: Serafina Mitchell, MD;  Location: [place];  Service: Vascular;  Laterality: Right;  . EYE SURGERY Bilateral   . INGUINAL HERNIA REPAIR Bilateral   . LAPAROSCOPIC CHOLECYSTECTOMY    . LEFT HEART CATHETERIZATION WITH CORONARY ANGIOGRAM N/A 10/09/2013   Procedure: LEFT HEART CATHETERIZATION WITH CORONARY ANGIOGRAM;  Surgeon: Larey Dresser, MD;  Location: [facility] C[facility] LAB;  Service: Cardiovascular;  Laterality: N/A;  . PERCUTANEOUS CORONARY STENT INTERVENTION (PCI-S)  10/09/2013   Procedure: PERCUTANEOUS CORONARY STENT INTERVENTION (PCI-S);  Surgeon: Larey Dresser, MD;  Location: [facility] CATH LAB;  Service: Cardiovascular;;   Family History  Problem Relation Age of Onset  . Emphysema Father     died @ 49- smoked  . Heart  disease Father   . Kidney Stones Father   . Diabetes Mother     died @ 25  . Stroke Mother   . Head & neck cancer Brother   . Lymphoma Brother   . Colon cancer Neg Hx   . Stomach cancer Neg Hx   . Rectal cancer Neg Hx   . Esophageal cancer Neg Hx   . Liver cancer Neg Hx    Social History  Substance Use Topics  . Smoking status: Former Smoker    Packs/day: 1.00    Years: 45.00    Types: Cigarettes    Quit date: 06/09/1999  . Smokeless tobacco: Never Used  . Alcohol use No   Current Outpatient Prescriptions  Medication Sig Dispense Refill  . Ascorbic Acid (VITAMIN C PO) Take 500 mg by mouth daily.     Marland Kitchen aspirin EC 81 MG  tablet Take 1 tablet (81 mg total) by mouth daily. 90 tablet 3  . atorvastatin (LIPITOR) 40 MG tablet TAKE ONE (1) TABLET EACH DAY AT 6PM 30 tablet 3  . Calcium Carb-Cholecalciferol (CALCIUM + D3) 600-200 MG-UNIT TABS Take by mouth daily.    . calcium carbonate (CALCIUM 600) 600 MG TABS tablet Take 600 mg by mouth daily.    . Calcium-Magnesium-Zinc 333-133-5 MG TABS Take by mouth daily.    . carvedilol (COREG) 12.5 MG tablet TAKE 1/2 TABLET BY MOUTH TWICE DAILY WITH A MEAL 30 tablet 3  . Coenzyme Q10 (CO Q10) 200 MG CAPS Take by mouth daily.    . CVS VITAMIN A PO Take 2,400 mcg by mouth daily.    . cyanocobalamin 1000 MCG tablet Take 1,000 mcg by mouth daily.    . furosemide (LASIX) 20 MG tablet TAKE ONE (1) TABLET EACH DAY MAY TAKE ANADDITIONAL TABLET AS NEEDED (Patient taking differently: 1 daily as needed) 30 tablet 3  . imatinib (GLEEVEC) 400 MG tablet Take 400 mg by mouth daily. Take with meals and large glass of water.Caution:Chemotherapy.    Marland Kitchen losartan (COZAAR) 25 MG tablet Take 0.5 tablets (12.5 mg total) by mouth daily. 15 tablet 3  . NIACIN PO Take 1 tablet by mouth daily.    . potassium chloride (K-DUR,KLOR-CON) 10 MEQ tablet Take 2 tablets (20 mEq total) by mouth daily. 60 tablet 6  . Probiotic Product (ALIGN PO) Take by mouth daily.    Marland Kitchen pyridoxine (B-6) 100 MG tablet Take 100 mg by mouth daily.    Marland Kitchen Respiratory Therapy Supplies (FLUTTER) DEVI Use as directed 1 each 0  . Thiamine Mononitrate (VITAMIN B1 PO) Take 250 mg by mouth daily.     No current facility-administered medications for this visit.    Allergies  Allergen Reactions  . Percocet [Oxycodone-Acetaminophen] Itching  . Metronidazole Other (See Comments)    Insomnia and nervousness     Review of Systems: All systems reviewed and negative except where noted in HPI.    Lab Results  Component Value Date   WBC 7.4 10/17/2016   HGB 11.9 (L) 10/17/2016   HCT 36.3 (L) 10/17/2016   MCV 97.1 10/17/2016   PLT 166  10/17/2016    Lab Results  Component Value Date   CREATININE 2.28 (H) 10/17/2016   BUN 22 (H) 10/17/2016   NA 140 10/17/2016   K 4.4 10/17/2016   CL 112 (H) 10/17/2016   CO2 21 (L) 10/17/2016    Lab Results  Component Value Date   ALT 31 10/17/2016   AST 41 10/17/2016  ALKPHOS 120 10/17/2016   BILITOT 0.5 10/17/2016     Physical Exam: BP (!) 118/54   Pulse 72   Ht 5\' 9"  (1.753 m)   Wt 146 lb 2 oz (66.3 kg)   BMI 21.58 kg/m  Constitutional: Pleasant,well-developed, male in no acute distress. HEENT: Normocephalic and atraumatic. Conjunctivae are normal. No scleral icterus. Neck supple.  Cardiovascular: Normal rate, regular rhythm.  Pulmonary/chest: Effort normal and breath sounds normal.  Abdominal: Soft, nondistended, nontender.  There are no masses palpable. No hepatomegaly. Extremities: (+) 1 edema BLE Lymphadenopathy: No cervical adenopathy noted. Neurological: Alert and oriented to person place and time. Skin: Skin is warm and dry. No rashes noted. Psychiatric: Normal mood and affect. Behavior is normal.   ASSESSMENT AND PLAN: 83 year old male with medical history as outlined above, including CML on Gleevec, here for visit to discuss the following issues:  Chronic diarrhea - ongoing since he started [place], I suspect may be the most likely cause for his symptoms given this medication can cause diarrhea in upwards of > 50% of patients on it, although discussed alternative etiologies and potential workup. Given his abnormal CT scan (thickening of ileum), history of reported diverticulitis, lack of prior colon cancer screening, I offered him a colonoscopy to further evaluate this issue and perform a screening. After discussion of this issue with his daughter and wife, given his age and other medical problems he strongly wished to avoid colonoscopy. He has had a friend with a bowel perforation from colonoscopy and is concerned about his severe diverticulosis and age  which increases the risk of this. While the risk of perforation is low, I understand his concerns. He prefers to treat empirically at this time and see how he responds. We'll try low dose Imodium, half pill to 1 pill morning initially to see if this works. For symptoms of his gas and bloating I also offered him a low FODMAP diet. Another potential etiology could be small bowel bacterial overgrowth from multiple small bowel diverticulum that were noted on CT. We can consider empiric antibiotics pending his course as well. He agreed that if he does not improve with conservative therapy he would then consider colonoscopy  Abnormal CT abdomen / history of diverticulitis - again we discussed colonoscopy to further evaluate, he declined as above. If symptoms persist he is amenable to colonoscopy.   Gonzales Cellar, MD [facility] Pager [phone]  CC: Drake Leach, MD

## 2017-01-18 NOTE — Telephone Encounter (Signed)
Left message informing pt that he can add Benefiber but should wait until he has finished his regimen of Immodium. Also, per Dr. Havery Moros Ensure should not cause diarrhea.

## 2017-01-18 NOTE — Patient Instructions (Signed)
If you are age 81 or older, your body mass index should be between 23-30. Your Body mass index is 21.58 kg/m. If this is out of the aforementioned range listed, please consider follow up with your Primary Care Provider.  If you are age 5 or younger, your body mass index should be between 19-25. Your Body mass index is 21.58 kg/m. If this is out of the aformentioned range listed, please consider follow up with your Primary Care Provider.   Please purchase Immodium over the counter. Take 1/2 tablet for a few days and then increase to 1 whole tablet there after. Please call Dr. Havery Moros if no better.  You have been given a Low [place] Diet to follow.  Thank you.

## 2017-02-08 ENCOUNTER — Other Ambulatory Visit: Payer: Self-pay | Admitting: Cardiology

## 2017-02-10 ENCOUNTER — Emergency Department (HOSPITAL_BASED_OUTPATIENT_CLINIC_OR_DEPARTMENT_OTHER)
Admission: EM | Admit: 2017-02-10 | Discharge: 2017-02-10 | Disposition: A | Discharge status: Home / Self Care | Payer: Medicare HMO | Attending: Emergency Medicine | Admitting: Emergency Medicine

## 2017-02-10 ENCOUNTER — Emergency Department (HOSPITAL_BASED_OUTPATIENT_CLINIC_OR_DEPARTMENT_OTHER): Payer: Medicare HMO

## 2017-02-10 ENCOUNTER — Encounter (HOSPITAL_BASED_OUTPATIENT_CLINIC_OR_DEPARTMENT_OTHER): Payer: Self-pay | Admitting: *Deleted

## 2017-02-10 DIAGNOSIS — N183 Chronic kidney disease, stage 3 (moderate): Secondary | ICD-10-CM | POA: Diagnosis not present

## 2017-02-10 DIAGNOSIS — Z7982 Long term (current) use of aspirin: Secondary | ICD-10-CM | POA: Insufficient documentation

## 2017-02-10 DIAGNOSIS — Z79899 Other long term (current) drug therapy: Secondary | ICD-10-CM | POA: Diagnosis not present

## 2017-02-10 DIAGNOSIS — M25561 Pain in right knee: Secondary | ICD-10-CM

## 2017-02-10 DIAGNOSIS — I2510 Atherosclerotic heart disease of native coronary artery without angina pectoris: Secondary | ICD-10-CM | POA: Diagnosis not present

## 2017-02-10 DIAGNOSIS — Z87891 Personal history of nicotine dependence: Secondary | ICD-10-CM | POA: Diagnosis not present

## 2017-02-10 DIAGNOSIS — I5022 Chronic systolic (congestive) heart failure: Secondary | ICD-10-CM | POA: Insufficient documentation

## 2017-02-10 IMAGING — DX DG KNEE COMPLETE 4+V*R*
4 series · 4 of 4 positions shown · non-contrast
Comparison: None.

CLINICAL DATA: Pain and limited range of motion

EXAM:
RIGHT KNEE - COMPLETE 4+ VIEW

[knee ap]
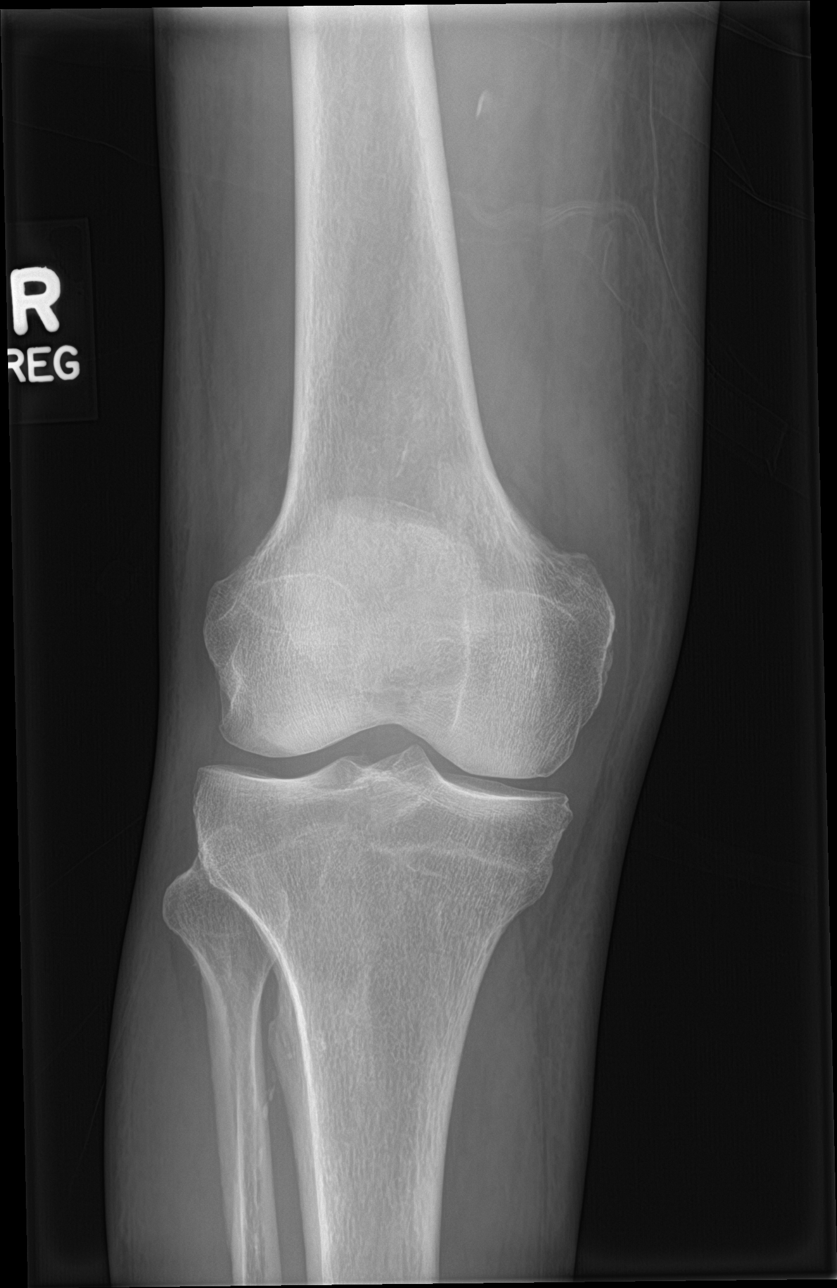

[knee lat]
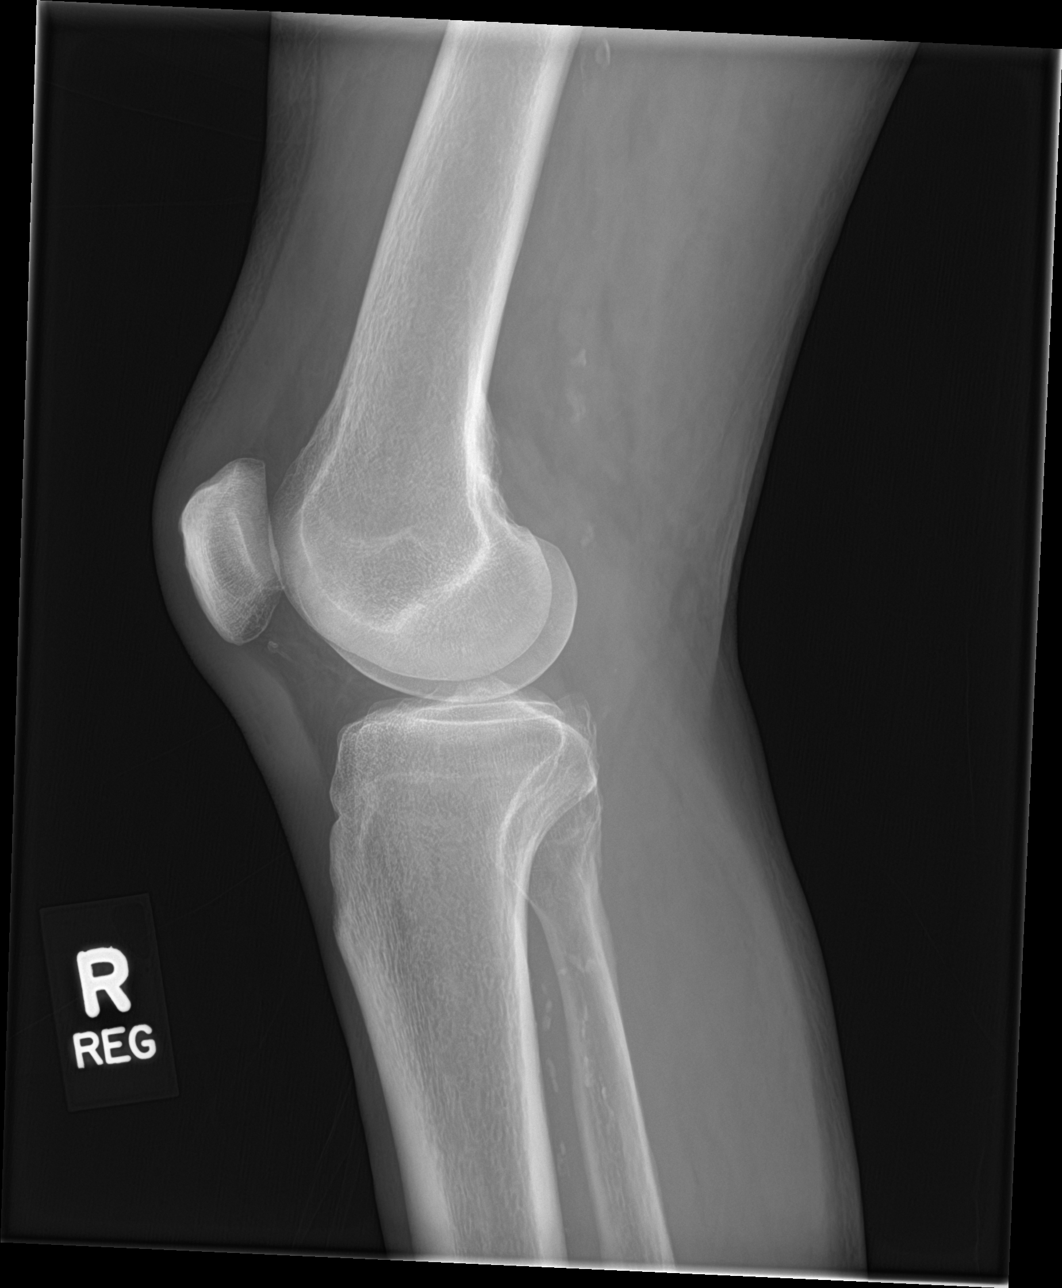

[knee obl (1 of 2)]
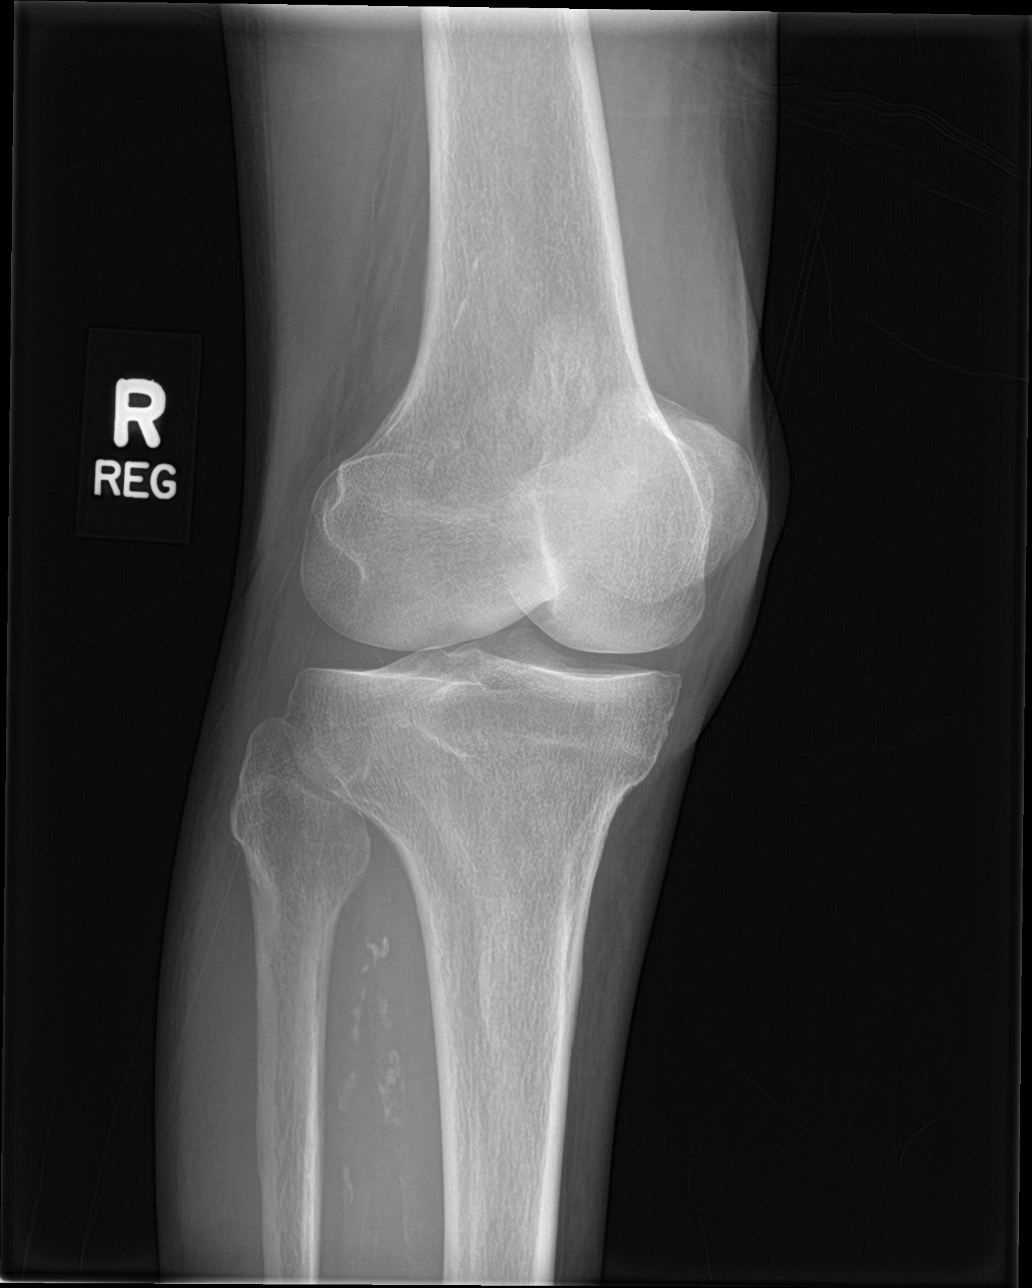

[knee obl (2 of 2)]
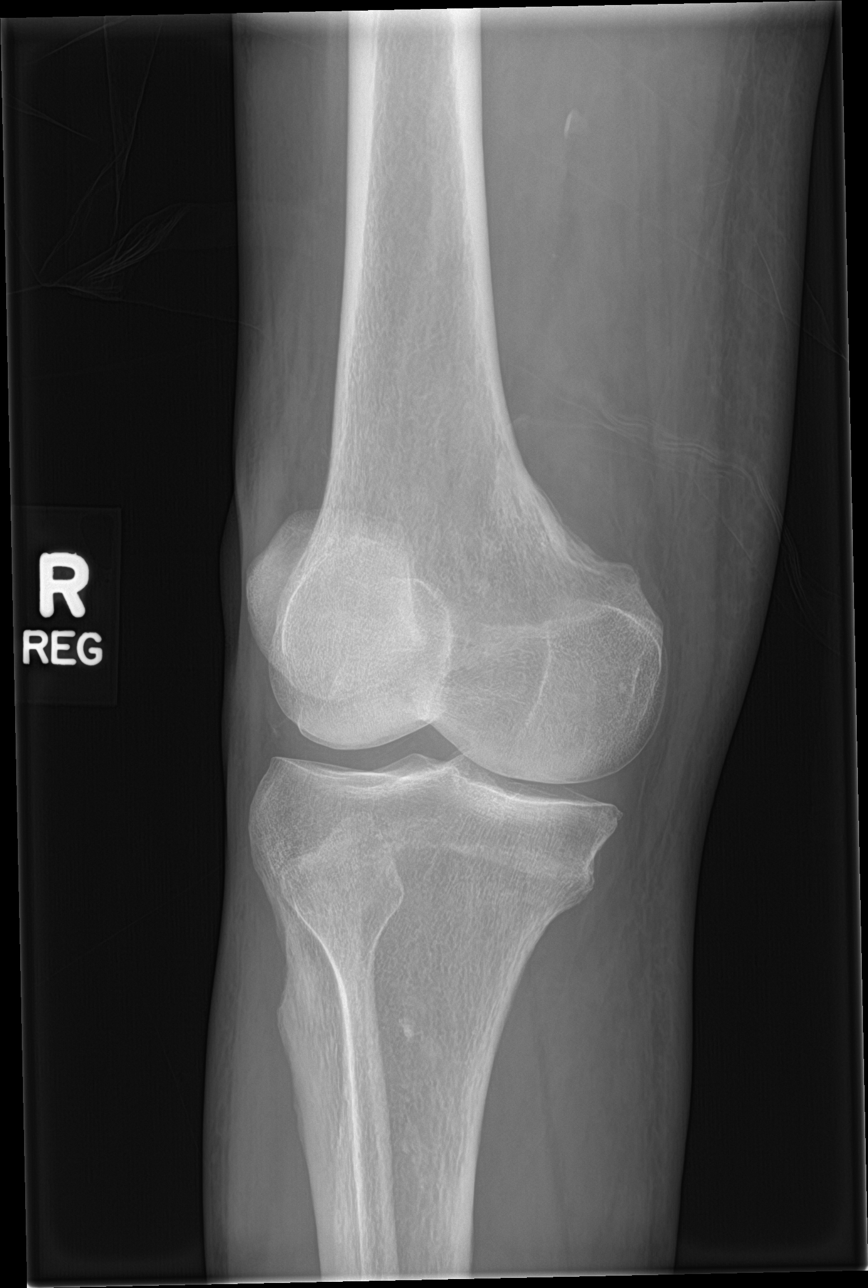

[4 of 4 positions shown; findings below may reference images not displayed]

FINDINGS: Frontal, lateral, and bilateral oblique views were obtained. There
is no fracture or dislocation. No joint effusion. There is mild
narrowing in the medial compartment. Other joint spaces appear
normal. There are foci of arterial vascular calcification at
multiple sites.
IMPRESSION: Narrowing medial compartment. No fracture or joint effusion. Areas
of atherosclerotic vascular calcification at multiple sites appear

## 2017-02-10 MED ORDER — HYDROCODONE-ACETAMINOPHEN 5-325 MG PO TABS
1.0000 | ORAL_TABLET | Freq: Once | ORAL | Status: AC
  Administered 2017-02-10: 1 via ORAL
  Filled 2017-02-10: qty 1

## 2017-02-10 MED ORDER — HYDROCODONE-ACETAMINOPHEN 5-325 MG PO TABS: 2.0000 | ORAL_TABLET | ORAL | 0 refills | Status: DC | PRN

## 2017-02-10 MED ORDER — ACETAMINOPHEN 325 MG PO TABS
650.0000 mg | ORAL_TABLET | Freq: Once | ORAL | Status: AC
  Administered 2017-02-10: 650 mg via ORAL
  Filled 2017-02-10: qty 2

## 2017-02-10 MED ORDER — MORPHINE SULFATE 15 MG PO TABS
15.0000 mg | ORAL_TABLET | Freq: Once | ORAL | Status: DC
  Filled 2017-02-10: qty 1

## 2017-02-10 MED ORDER — ACETAMINOPHEN 500 MG PO TABS
1000.0000 mg | ORAL_TABLET | Freq: Once | ORAL | Status: DC
  Filled 2017-02-10: qty 2

## 2017-02-10 NOTE — ED Triage Notes (Signed)
Pt reports he got out of his recliner last night and immediately felt sharp pain to R knee. Denies previous injury. Denies falling. Denies numbness/tingling. Denies taking pain meds PTA. Pt able to bear little weight on leg.

## 2017-02-10 NOTE — Discharge Instructions (Signed)
Rest your knee.  You can take a 500mg  tylenol with your pain meds.  Return for redness, swelling, fever.

## 2017-02-10 NOTE — ED Provider Notes (Signed)
[facility] DEPT [facility] Provider Note   CSN: [id number] Arrival date & time: 02/10/17  1000     History   Chief Complaint Chief Complaint  Patient presents with  . Knee Pain    HPI [name] [name] is a 83 y.o. male.  84 yo M with a chief complaint of right knee pain. This started yesterday when he got up from a chair. Had sudden pain to the heel aspect of the knee. Pain with bearing weight and movement. Denies radiation. Denies trauma. Denies prior damage to the knee.   The history is provided by the patient.  Knee Pain   This is a new problem. The current episode started yesterday. The problem occurs constantly. The problem has not changed since onset.The pain is present in the right knee. The quality of the pain is described as sharp. The pain is at a severity of 10/10. The pain is severe. The symptoms are aggravated by activity and standing. He has tried nothing for the symptoms. The treatment provided no relief. There has been no history of extremity trauma.    Past Medical History:  Diagnosis Date  . Carotid artery occlusion   . Chronic systolic CHF (congestive heart failure) ([place])    a. 07/2013 Echo: EF 35-40%  . CKD (chronic kidney disease), stage III    pt. states that he does not have CKD  . CML (chronic myelocytic leukemia) ([place])   . Coronary artery disease    a. 09/2013 Cath: LM min irregs, LAD 95p(2.75x20 Promus Premier DES & 3.0x20 Promus Premier DES overlapping), D1 50-60, LCX 30p, OM1 51m, OM2 60-70p, RCA small, nl.  . Diverticulitis   . History of hiatal hernia   . History of kidney stones   . History of small bowel obstruction   . Hyperlipidemia   . Ischemic cardiomyopathy    a. 07/2013 Echo: EF 35-40%, apical septal, apical lat, apical AK, mod MR.  . Small bowel perforation ([place])    a. 11/2012 s/p emergent SB resection.    Patient Active Problem List   Diagnosis Date Noted  . Cough 08/17/2016  . Carotid stenosis 09/17/2015  . Right carotid bruit  12/14/2014  . Unstable angina ([place]) 10/10/2013  . Ischemic cardiomyopathy   . Hyperlipidemia   . Chronic systolic CHF (congestive heart failure) ([place]) 10/03/2013  . CAD (coronary artery disease) 10/03/2013  . Acute systolic heart failure ([place]) 08/11/2013  . Chronic kidney disease (CKD), stage III (moderate) 08/11/2013  . Acute respiratory failure with hypoxia ([place]) 08/09/2013  . Acute pulmonary edema ([place]) 08/09/2013  . Small bowel obstruction 08/09/2013    Past Surgical History:  Procedure Laterality Date  . CARDIAC CATHETERIZATION  10/09/2013  . COLON SURGERY  11/2012   BOWEL SURGERY   . CORONARY ANGIOPLASTY  10/09/2013   MID LAD  . ENDARTERECTOMY Right 09/17/2015   Procedure: Right Carotid ENDARTERECTOMY with Xenosure Patch Angioplasty;  Surgeon: Serafina Mitchell, MD;  Location: [place];  Service: Vascular;  Laterality: Right;  . EYE SURGERY Bilateral   . INGUINAL HERNIA REPAIR Bilateral   . LAPAROSCOPIC CHOLECYSTECTOMY    . LEFT HEART CATHETERIZATION WITH CORONARY ANGIOGRAM N/A 10/09/2013   Procedure: LEFT HEART CATHETERIZATION WITH CORONARY ANGIOGRAM;  Surgeon: Larey Dresser, MD;  Location: [facility] CATH LAB;  Service: Cardiovascular;  Laterality: N/A;  . PERCUTANEOUS CORONARY STENT INTERVENTION (PCI-S)  10/09/2013   Procedure: PERCUTANEOUS CORONARY STENT INTERVENTION (PCI-S);  Surgeon: Larey Dresser, MD;  Location: [facility] CATH LAB;  Service: Cardiovascular;;  Home Medications    Prior to Admission medications   Medication Sig Start Date End Date Taking? Authorizing Provider  Ascorbic Acid (VITAMIN C PO) Take 500 mg by mouth daily.    Yes Historical Provider, MD  aspirin EC 81 MG tablet Take 1 tablet (81 mg total) by mouth daily. 10/02/13  Yes Larey Dresser, MD  atorvastatin (LIPITOR) 40 MG tablet TAKE ONE (1) TABLET [facility] 08/30/16  Yes Larey Dresser, MD  Calcium Carb-Cholecalciferol (CALCIUM + D3) 600-200 MG-UNIT TABS Take by mouth daily.   Yes Historical  Provider, MD  calcium carbonate (CALCIUM 600) 600 MG TABS tablet Take 600 mg by mouth daily.   Yes Historical Provider, MD  Calcium-Magnesium-Zinc 250-441-5662 MG TABS Take by mouth daily.   Yes Historical Provider, MD  carvedilol (COREG) 12.5 MG tablet TAKE 1/2 TABLET BY MOUTH TWICE DAILY WITH A MEAL 12/11/16  Yes Larey Dresser, MD  Coenzyme Q10 (CO Q10) 200 MG CAPS Take by mouth daily.   Yes Historical Provider, MD  CVS VITAMIN A PO Take 2,400 mcg by mouth daily.   Yes Historical Provider, MD  cyanocobalamin 1000 MCG tablet Take 1,000 mcg by mouth daily.   Yes Historical Provider, MD  furosemide (LASIX) 20 MG tablet TAKE ONE (1) TABLET EACH DAY MAY TAKE ANADDITIONAL TABLET AS NEEDED Patient taking differently: 1 daily as needed 07/04/16  Yes Larey Dresser, MD  imatinib (GLEEVEC) 400 MG tablet Take 400 mg by mouth daily. Take with meals and large glass of water.Caution:Chemotherapy.   Yes Historical Provider, MD  losartan (COZAAR) 25 MG tablet Take 0.5 tablets (12.5 mg total) by mouth daily. 10/09/16  Yes Larey Dresser, MD  NIACIN PO Take 1 tablet by mouth daily.   Yes Historical Provider, MD  potassium chloride (K-DUR,KLOR-CON) 10 MEQ tablet Take 2 tablets (20 mEq total) by mouth daily. 09/14/16  Yes Jolaine Artist, MD  Probiotic Product (ALIGN PO) Take by mouth daily.   Yes Historical Provider, MD  pyridoxine (B-6) 100 MG tablet Take 100 mg by mouth daily.   Yes Historical Provider, MD  Respiratory Therapy Supplies (FLUTTER) DEVI Use as directed 08/17/16  Yes Tanda Rockers, MD  Thiamine Mononitrate (VITAMIN B1 PO) Take 250 mg by mouth daily.   Yes Historical Provider, MD  HYDROcodone-acetaminophen (NORCO) 5-325 MG tablet Take 2 tablets by mouth every 4 (four) hours as needed. 02/10/17   Deno Etienne, DO    Family History Family History  Problem Relation Age of Onset  . Emphysema Father     died @ 32- smoked  . Heart disease Father   . Kidney Stones Father   . Diabetes Mother     died  @ 53  . Stroke Mother   . Head & neck cancer Brother   . Lymphoma Brother   . Colon cancer Neg Hx   . Stomach cancer Neg Hx   . Rectal cancer Neg Hx   . Esophageal cancer Neg Hx   . Liver cancer Neg Hx     Social History Social History  Substance Use Topics  . Smoking status: Former Smoker    Packs/day: 1.00    Years: 45.00    Types: Cigarettes    Quit date: 06/09/1999  . Smokeless tobacco: Never Used  . Alcohol use No     Allergies   Percocet [oxycodone-acetaminophen] and Metronidazole   Review of Systems Review of Systems  Constitutional: Negative for chills and fever.  HENT:  Negative for congestion and facial swelling.   Eyes: Negative for discharge and visual disturbance.  Respiratory: Negative for shortness of breath.   Cardiovascular: Negative for chest pain and palpitations.  Gastrointestinal: Negative for abdominal pain, diarrhea and vomiting.  Musculoskeletal: Positive for arthralgias, joint swelling and myalgias.  Skin: Negative for color change and rash.  Neurological: Negative for tremors, syncope and headaches.  Psychiatric/Behavioral: Negative for confusion and dysphoric mood.     Physical Exam Updated Vital Signs BP 129/63 (BP Location: Right Arm)   Pulse 62   Temp 98.2 F (36.8 C) (Oral)   Resp 14   Ht 5' 9.5" (1.765 m)   Wt 143 lb (64.9 kg)   SpO2 95%   BMI 20.81 kg/m   Physical Exam  Constitutional: He is oriented to person, place, and time. He appears well-developed and well-nourished.  HENT:  Head: Normocephalic and atraumatic.  Eyes: EOM are normal. Pupils are equal, round, and reactive to light.  Neck: Normal range of motion. Neck supple. No JVD present.  Cardiovascular: Normal rate and regular rhythm.  Exam reveals no gallop and no friction rub.   No murmur heard. Pulmonary/Chest: No respiratory distress. He has no wheezes.  Abdominal: He exhibits no distension. There is no rebound and no guarding.  Musculoskeletal: Normal range  of motion. He exhibits edema and tenderness.  Tenderness and mild edema to the medial aspect of the right knee. Pain is worse at the attachment of the hamstrings to the tibial plateau. He does have some significant pain at the medial femoral condyle. No appreciable swelling of the actual knee joint. Pulse motor and sensation is intact distally. Unable to test the ligaments due to pain.  Neurological: He is alert and oriented to person, place, and time.  Skin: No rash noted. No pallor.  Psychiatric: He has a normal mood and affect. His behavior is normal.  Nursing note and vitals reviewed.    ED Treatments / Results  Labs (all labs ordered are listed, but only abnormal results are displayed) Labs Reviewed - No data to display  EKG  EKG Interpretation None       Radiology Dg Knee Complete 4 Views Right  Result Date: 02/10/2017 CLINICAL DATA:  Pain and limited range of motion EXAM: RIGHT KNEE - COMPLETE 4+ VIEW COMPARISON:  None. FINDINGS: Frontal, lateral, and bilateral oblique views were obtained. There is no fracture or dislocation. No joint effusion. There is mild narrowing in the medial compartment. Other joint spaces appear normal. There are foci of arterial vascular calcification at multiple sites. IMPRESSION: Narrowing medial compartment. No fracture or joint effusion. Areas of atherosclerotic vascular calcification at multiple sites appear Electronically Signed   By: Lowella Grip III M.D.   On: 02/10/2017 11:01    Procedures Procedures (including critical care time)  Medications Ordered in ED Medications  HYDROcodone-acetaminophen (NORCO/VICODIN) 5-325 MG per tablet 1 tablet (1 tablet Oral Given 02/10/17 1053)  acetaminophen (TYLENOL) tablet 650 mg (650 mg Oral Given 02/10/17 1053)     Initial Impression / Assessment and Plan / ED Course  I have reviewed the triage vital signs and the nursing notes.  Pertinent labs & imaging results that were available during my care  of the patient were reviewed by me and considered in my medical decision making (see chart for details).     83 yo M With a chief complaint of right knee pain. Suspect that this is a strain of the hamstring attachment to the tibia. Patient does  have a history of CLL and some mild edema to the medial femoral condyle. Will obtain a plain film.   Xray negative, d/c home.   11:46 AM:  I have discussed the diagnosis/risks/treatment options with the patient and family and believe the pt to be eligible for discharge home to follow-up with PCP. We also discussed returning to the ED immediately if new or worsening sx occur. We discussed the sx which are most concerning (e.g., sudden worsening pain, fever, inability to tolerate by mouth) that necessitate immediate return. Medications administered to the patient during their visit and any new prescriptions provided to the patient are listed below.  Medications given during this visit Medications  HYDROcodone-acetaminophen (NORCO/VICODIN) 5-325 MG per tablet 1 tablet (1 tablet Oral Given 02/10/17 1053)  acetaminophen (TYLENOL) tablet 650 mg (650 mg Oral Given 02/10/17 1053)     The patient appears reasonably screen and/or stabilized for discharge and I doubt any other medical condition or other [facility] requiring further screening, evaluation, or treatment in the ED at this time prior to discharge.   Final Clinical Impressions(s) / ED Diagnoses   Final diagnoses:  Acute pain of right knee    New Prescriptions New Prescriptions   HYDROCODONE-ACETAMINOPHEN (NORCO) 5-325 MG TABLET    Take 2 tablets by mouth every 4 (four) hours as needed.     Deno Etienne, DO 02/10/17 1146

## 2017-02-10 NOTE — ED Notes (Signed)
Patient transported to X-ray 

## 2017-02-10 NOTE — ED Notes (Signed)
ED Provider at bedside. 

## 2017-02-16 ENCOUNTER — Other Ambulatory Visit: Payer: Self-pay | Admitting: Cardiology

## 2017-03-06 ENCOUNTER — Encounter: Payer: Self-pay | Admitting: Cardiology

## 2017-03-09 ENCOUNTER — Telehealth (HOSPITAL_COMMUNITY): Payer: Self-pay | Admitting: *Deleted

## 2017-03-09 NOTE — Telephone Encounter (Signed)
Pts daughter left VM wanting to know when patient should schedule a follow up. Patient was last seen May 2017 and was due to return in 4 months for a follow up.  I called her back to get pt scheduled no answer/no VM

## 2017-04-03 ENCOUNTER — Other Ambulatory Visit: Payer: Self-pay | Admitting: Cardiology

## 2017-04-09 ENCOUNTER — Encounter (HOSPITAL_COMMUNITY): Payer: Medicare HMO

## 2017-04-17 ENCOUNTER — Ambulatory Visit (HOSPITAL_COMMUNITY)
Admission: RE | Admit: 2017-04-17 | Discharge: 2017-04-17 | Disposition: A | Discharge status: Home / Self Care | Payer: Medicare HMO | Source: Ambulatory Visit | Attending: Cardiology | Admitting: Cardiology

## 2017-04-17 ENCOUNTER — Telehealth (HOSPITAL_COMMUNITY): Payer: Self-pay | Admitting: Cardiology

## 2017-04-17 ENCOUNTER — Encounter (HOSPITAL_COMMUNITY): Payer: Self-pay

## 2017-04-17 VITALS — BP 118/60 | HR 70 | Wt 140.0 lb

## 2017-04-17 DIAGNOSIS — Z7982 Long term (current) use of aspirin: Secondary | ICD-10-CM | POA: Diagnosis not present

## 2017-04-17 DIAGNOSIS — N183 Chronic kidney disease, stage 3 unspecified: Secondary | ICD-10-CM

## 2017-04-17 DIAGNOSIS — I2510 Atherosclerotic heart disease of native coronary artery without angina pectoris: Secondary | ICD-10-CM

## 2017-04-17 DIAGNOSIS — Z87891 Personal history of nicotine dependence: Secondary | ICD-10-CM | POA: Diagnosis not present

## 2017-04-17 DIAGNOSIS — I255 Ischemic cardiomyopathy: Secondary | ICD-10-CM | POA: Insufficient documentation

## 2017-04-17 DIAGNOSIS — I5022 Chronic systolic (congestive) heart failure: Secondary | ICD-10-CM | POA: Insufficient documentation

## 2017-04-17 DIAGNOSIS — C9210 Chronic myeloid leukemia, BCR/ABL-positive, not having achieved remission: Secondary | ICD-10-CM | POA: Diagnosis not present

## 2017-04-17 DIAGNOSIS — Z79899 Other long term (current) drug therapy: Secondary | ICD-10-CM | POA: Diagnosis not present

## 2017-04-17 DIAGNOSIS — I6529 Occlusion and stenosis of unspecified carotid artery: Secondary | ICD-10-CM | POA: Diagnosis not present

## 2017-04-17 DIAGNOSIS — Z955 Presence of coronary angioplasty implant and graft: Secondary | ICD-10-CM | POA: Insufficient documentation

## 2017-04-17 DIAGNOSIS — E785 Hyperlipidemia, unspecified: Secondary | ICD-10-CM | POA: Diagnosis not present

## 2017-04-17 NOTE — Progress Notes (Signed)
Advanced Heart Failure Clinic Note   PCP: Dr. Sallyanne Havers [facility])  83 yo male with history of recurrent small bowel obstruction and CAD with ischemic cardiomyopathy presents for followup.  In 9/14, patient was hospitalized with recurrent abdominal pain from small bowel obstruction.  This was managed conservatively with NG tube/NPO and IV fluid.  In the hospital, the patient developed pulmonary edema and actually ended up being intubated.  Troponin was normal. Echo was done showing EF 35-40% with anteroseptal, anterolateral, and apical akinesis along with moderate AI.  He had no prior known cardiac disease.  It was thought that the patient had had an out of hospital MI at some point then developed pulmonary edema with IVF while NPO.  He was diuresed and extubated.  Creatinine was up to about 1.6 in the hospital so no cardiac cath was done (not thought to be urgent given normal creatinine).   I took him for [facility] in 11/14.  This showed 95% mid LAD stenosis that was treated with 2 overlapping Promus DES.  Echo in 2/15 showed EF 30-35% with normal RV.  Coreg was cut back to 6.25 bid due to bradycardia and fatigue.   Had R CEA in 11/16 with Dr Trula Slade. 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 He developed gynecomastia and had to stop spironolactone.  Eplerenone caused nausea/vomiting.    Stable symptomatically today.  No significant dyspnea except with heavy exertion.  He was able to mow his entire lawn with a self-propelled lawnmower recently.  No chest pain.  No orthopnea/PND.  No lightheadedness.  He is using Lasix only as needed, has not taken any for about 2 wks (takes Lasix a couple times a month).  At home, weight is stable.  Here it appears up 5 lbs.   ECG (personally reviewed): NSR, old anterior MI  Labs (9/14): K 3.4, creatinine 1.55 => 1.4, LDL 89, HDL 38 Labs (11/14): K 4.9, creatinine 1.6 Labs (1/15): K 4.1, creatinine 1.4 Labs (2/15): LDL 34, HDL 32 Labs (2/16): K 4.5, creatinine 1.5 Labs (8/16):  LDL 26, HDL 33 Labs (11/16): K 3.4, creatinine 1.49 Labs (4/17): K 4, creatinine 1.65 Labs (4/18): K 4.4, creatinine 1.4 Labs (5/18): K 4.3, creatinine 1.68, LDL 21, HDL 23  PMH: 1. Nephrolithiasis 2. H/o CCY 3. H/o diverticulitis 4. H/o SBO: initially in 1/14 with small bowel resection, again in 9/14 treated conservatively.  5. Ischemic cardiomyopathy: Echo (9/14) with EF 35-40%, anteroseptal/anterolateral/apical akinesis and moderate AI.  Patient had acute pulmonary edema while hospitalized in 9/14. Echo (2/15) with EF 30-35% with wall motion abnormalities, normal RV size and systolic function, mild MR and mild AI.  6. CKD stage 3 7. Aortic insufficiency: mild on last echo 8. CAD: LHC (11/14) with 95% mLAD stenosis treated with 2 overlapping Promus DES.  There was residual 50% stenosis beyond the stent margin.   9. Carotid stenosis: 2/16 carotid dopplers with 80-99% RICA stenosis, 96-28% LICA stenosis. CTA neck showed 90% RICA stenosis. Right CEA 10/16.  10. CML: Diagnosed in 2016, on imatinib.  11. COPD: Emphysema on chest CT.   SH: Married (2nd time) with 4 children.  Lives in [place].  Previous heavy smoker.  Retired Chief Financial Officer.   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   ROS: All systems reviewed and negative except as per HPI.   Current Outpatient Prescriptions  Medication Sig Dispense Refill  . Ascorbic Acid (VITAMIN C PO) Take 500 mg by mouth daily.     Marland Kitchen aspirin EC  81 MG tablet Take 1 tablet (81 mg total) by mouth daily. 90 tablet 3  . atorvastatin (LIPITOR) 40 MG tablet TAKE ONE (1) TABLET BY MOUTH EVERY DAY AT 6PM 30 tablet 11  . Calcium Carb-Cholecalciferol (CALCIUM + D3) 600-200 MG-UNIT TABS Take by mouth daily.    . calcium carbonate (CALCIUM 600) 600 MG TABS tablet Take 600 mg by mouth daily.    . Calcium-Magnesium-Zinc 333-133-5 MG TABS Take by mouth daily.    . carvedilol (COREG) 12.5 MG tablet TAKE 1/2 TABLET BY MOUTH TWICE DAILY WITH A MEAL 30 tablet 3  .  Coenzyme Q10 (CO Q10) 200 MG CAPS Take by mouth daily.    . CVS VITAMIN A PO Take 2,400 mcg by mouth daily.    . cyanocobalamin 1000 MCG tablet Take 1,000 mcg by mouth daily.    Marland Kitchen imatinib (GLEEVEC) 400 MG tablet Take 400 mg by mouth daily. Take with meals and large glass of water.Caution:Chemotherapy.    Marland Kitchen losartan (COZAAR) 25 MG tablet TAKE 1/2 TABLET DAILY 15 tablet 6  . NIACIN PO Take 1 tablet by mouth daily.    . potassium chloride (K-DUR,KLOR-CON) 10 MEQ tablet Take 2 tablets (20 mEq total) by mouth daily. 60 tablet 6  . Probiotic Product (ALIGN PO) Take by mouth daily.    Marland Kitchen pyridoxine (B-6) 100 MG tablet Take 100 mg by mouth daily.    Marland Kitchen Respiratory Therapy Supplies (FLUTTER) DEVI Use as directed 1 each 0  . Thiamine Mononitrate (VITAMIN B1 PO) Take 250 mg by mouth daily.    . furosemide (LASIX) 20 MG tablet Take 20 mg by mouth daily as needed for fluid or edema.     No current facility-administered medications for this encounter.     BP 118/60   Pulse 70   Wt 140 lb (63.5 kg)   SpO2 99%   BMI 20.38 kg/m  General: NAD Neck: No JVD, no thyromegaly or thyroid nodule.  Lungs: Clear bilaterally, normal effort CV: Nondisplaced PMI.  Heart regular S1/S2, no S3/S4, 1/6 early SEM RUSB.  1+ edema to knees bilaterally. R CEA scar. Normal pedal pulses.  Abdomen: Soft, NT, ND, no HSM. No bruits or masses. +BS  Skin: Intact without lesions or rashes.  Neurologic: Alert and oriented x 3.  Psych: Normal affect. Extremities: No clubbing or cyanosis.   Assessment/Plan: 1. Chronic systolic CHF: Ischemic cardiomyopathy, EF 35-40% on 8/16 echo. Volume status looks ok.  NYHA class II symptoms, stable.  Unable to take spironolactone (gynecomastia) or eplerenone (nausea/vomiting).  BP has not tolerated uptitration of losartan or Coreg (symptomatic hypotension).   - Continue current doses of Coreg and losartan.  - No ICD given age.  - May continue to use Lasix as needed.  With gain of 3-4 lbs in a  week or 2 lbs in a day, he will take Lasix 20 mg daily x 3 days.   - I will arrange for repeat echo.  2. CAD: S/p DES to mLAD with overlapping Promus stents in 11/14.  Of note, there was residual 50% stenosis just beyond the stent margin.    - Continue ASA and statin.     3. Hyperlipidemia: Continue atorvastatin 40 mg daily. Lipids good 5/18. 4. CKD: Stage 3, stable.  5. Carotid stenosis: s/p R CEA 09/2015 with Dr Trula Slade.  Follows at [facility].   Loralie Champagne 04/17/2017

## 2017-04-17 NOTE — Patient Instructions (Signed)
If weight increases by 3-4 lbs in 1 week or 2 lbs overnight, take Lasix 40 mg daily FOR 3 DAYS  Your physician has requested that you have an echocardiogram. Echocardiography is a painless test that uses sound waves to create images of your heart. It provides your doctor with information about the size and shape of your heart and how well your heart's chambers and valves are working. This procedure takes approximately one hour. There are no restrictions for this procedure.  We will contact you in 6 months to schedule your next appointment.

## 2017-04-18 NOTE — Telephone Encounter (Signed)
04/17/2017 10:55 AM Phone (Outgoing) [name], [name] (Self) [phone] (H)   Left Message - Called pt and lmsg for him to CB to get scheduled for echo.     By Verdene Rio

## 2017-04-23 ENCOUNTER — Other Ambulatory Visit: Payer: Self-pay | Admitting: Cardiology

## 2017-04-30 ENCOUNTER — Other Ambulatory Visit: Payer: Self-pay

## 2017-04-30 ENCOUNTER — Ambulatory Visit (HOSPITAL_COMMUNITY): Payer: Medicare HMO | Attending: Cardiology

## 2017-04-30 DIAGNOSIS — I5022 Chronic systolic (congestive) heart failure: Secondary | ICD-10-CM | POA: Insufficient documentation

## 2017-04-30 MED ORDER — PERFLUTREN LIPID MICROSPHERE
1.0000 mL | INTRAVENOUS | Status: AC | PRN
  Administered 2017-04-30: 2 mL via INTRAVENOUS

## 2017-05-03 ENCOUNTER — Telehealth (HOSPITAL_COMMUNITY): Payer: Self-pay | Admitting: *Deleted

## 2017-05-03 NOTE — Telephone Encounter (Signed)
ECHOCARDIOGRAM COMPLETE  Order: [id number]  Status:  Final result Visible to patient:  No (Not Released) Dx:  Chronic systolic CHF (congestive hear...  Notes recorded by Kennieth Rad, RN on 05/03/2017 at 2:01 PM EDT Patient's daughter called for results of echo. Results given and no further questions at this time. ------  Notes recorded by Larey Dresser, MD on 05/01/2017 at 9:37 PM EDT EF 30-35%, similar to prior.

## 2017-05-04 ENCOUNTER — Telehealth (HOSPITAL_COMMUNITY): Payer: Self-pay

## 2017-05-04 NOTE — Telephone Encounter (Signed)
Patient's daughter requesting to have echo report mailed to patient. Also asking about plans for future medication regimen. Mailed report with notation to continue all medication regimens as prescribed for optimal cardiac function and symptom management.  Renee Pain, RN

## 2017-05-31 ENCOUNTER — Telehealth (HOSPITAL_COMMUNITY): Payer: Self-pay | Admitting: *Deleted

## 2017-05-31 DIAGNOSIS — I5022 Chronic systolic (congestive) heart failure: Secondary | ICD-10-CM

## 2017-05-31 NOTE — Telephone Encounter (Signed)
He has quite significant vascular disease and statin is important.  Isolated pain in one arm would be an unusual statin side effect.  However, if he feels better off Lipitor, would have him try Crestor 5 mg daily with lipids/LFTs in 2 months.

## 2017-05-31 NOTE — Telephone Encounter (Signed)
Pt's wife called saying that Dr. [name] (oncologist) has stopped patient's Lipitor due to left arm pain and right wrist pain that recently started.  Dr. [name] recommended he discuss with Dr. Aundra Dubin before restarting medication.  I'm routing message to Doroteo Bradford, PharmD and Dr. Aundra Dubin to review.

## 2017-06-01 MED ORDER — ROSUVASTATIN CALCIUM 5 MG PO TABS: 5.0000 mg | ORAL_TABLET | Freq: Every day | ORAL | 3 refills | Status: DC

## 2017-06-01 NOTE — Telephone Encounter (Signed)
Called and spoke with patient and he says he has felt a little pain relief but he really believes that the Lipitor is causing his joint pain and wants to try Crestor 5 mg.  I have sent to preferred pharmacy and scheduled him for lab appointment in 2 months.  No further questions

## 2017-06-04 ENCOUNTER — Telehealth (HOSPITAL_COMMUNITY): Payer: Self-pay | Admitting: *Deleted

## 2017-06-04 NOTE — Telephone Encounter (Signed)
Pt's daughter called very upset that after she called our office last week, instead of [place] calling her back with Dr Claris Gladden recommendation we called the patient back.  She states he has a hard time hearing and gets meds mixed up due to that and that is why she makes the calls for him.  She reports patient became very upset and could not understand and he became very upset and agitate and then we ask him if it was best if we call his daughter and that made him even more upset as he tries to be independent.  Apologized to her that this had occurred, will discuss w/office staff.  Spent over 10 min on phone discussing with her, at end of conversation she was happy and thankful for my help.

## 2017-06-06 ENCOUNTER — Encounter (HOSPITAL_COMMUNITY): Payer: Medicare HMO | Admitting: Cardiology

## 2017-06-14 ENCOUNTER — Telehealth (HOSPITAL_COMMUNITY): Payer: Self-pay | Admitting: *Deleted

## 2017-06-14 NOTE — Telephone Encounter (Signed)
Spoke w/pt's daughter, she states the pain seemed better while pt was off Atorvastatin but has returned since he has started the Crestor, would like Dr Claris Gladden recommendation, will send to him for review

## 2017-06-14 NOTE — Telephone Encounter (Signed)
I think he would be very unlikely to get pain in just one arm from a statin.  It is probably something else.  However, he can try decreasing the Crestor to every other day.

## 2017-06-14 NOTE — Telephone Encounter (Signed)
-----   Message from Kara Mead sent at 06/13/2017  2:47 PM EDT ----- Regarding: Still having arm discomfort Hey,  Pt's daughter Molly Maduro called regarding her father.  She states that his arm is still bothering him, aching and hurting (she is not sure which arm).  He had originally thought it was the Lipitor and stopped the meds.  It was feeling some better being off the Lipitor.  Pt is now taking Crestor and experiencing the arm aching & hurting again.     Please call her [phone] only, not her father, regarding this  Thanks, [place]

## 2017-06-15 MED ORDER — ROSUVASTATIN CALCIUM 5 MG PO TABS: 5.0000 mg | ORAL_TABLET | ORAL | 3 refills | Status: DC

## 2017-06-15 NOTE — Telephone Encounter (Addendum)
Patient's daughter made aware of recommendations to cut back crestor to every other day. Med list updated to reflect change. Patient's daughter will call next week to update [place] on symptoms. Advised if symptoms worsen, arm becomes discolored, numb, or swollen, for patient to report to ED. Daughter aware and agreeable.  Renee Pain, RN

## 2017-06-22 ENCOUNTER — Telehealth (HOSPITAL_COMMUNITY): Payer: Self-pay | Admitting: Cardiology

## 2017-06-22 NOTE — Telephone Encounter (Signed)
PATIENTS DAUGHTER LEFT VOICEMAIL ON TRIAGE LINE WITH MULTIPLE CONCERNS   1. REPORTS PATIENT IS STILL HAVING L ARM PAIN X 3 WEEKS NOW, PATIENT STATES IT FEELS LIKE A NAIL IS BEING PUSHED THROUGH HIS ARM. PATIENT ALREADY DISCONTINUED CRESTOR.  2. PATIENTS DAUGHTER REPORTS A VEIN HAS POPPED OUT AND MOVED FROM BEHIND HIS EAR TO ABOVE HIS EYEBROW DENIES- HA, VISUAL CHANGES, CHEST PAIN OR FALLS RECENTLY   I ADVISED THAT PER DR MCLEANS LAST REVIEW OF ARM PAIN HE DID NOT FEEL IT WAS RELATED TO THE CRESTOR. IF PAIN CONTINUES PATIENT SHOULD CONTACT PCP FOR FURTHER RECOMMENDATIONS.  I ALSO ADVISED I WOULD FORWARD MESSAGE TO DR [facility] REGARDING MOVING VEINS, HOWEVER IT WAS WORTH CONTACTING [facility] FOR RECOMMENDATIONS AS HE IS ESTABLISHED WITH DR Trula Slade

## 2017-06-22 NOTE — Telephone Encounter (Signed)
PATIENT AWARE

## 2017-06-22 NOTE — Telephone Encounter (Signed)
Agree.  Highly unlikely that pain in 1 arm like a nail is caused by Crestor.  Staying off statin will increase risk of worsening CAD.  Would suggest he resume it unless the pain clearly gets better rapidly off Crestor.  Needs to see PCP for musculoskeletal evaluation.

## 2017-06-27 ENCOUNTER — Encounter: Payer: Self-pay | Admitting: Surgery

## 2017-07-09 ENCOUNTER — Encounter (HOSPITAL_COMMUNITY): Payer: Medicare HMO

## 2017-07-09 ENCOUNTER — Ambulatory Visit: Payer: Medicare HMO | Admitting: Surgery

## 2017-08-01 ENCOUNTER — Ambulatory Visit (HOSPITAL_COMMUNITY)
Admission: RE | Admit: 2017-08-01 | Discharge: 2017-08-01 | Disposition: A | Discharge status: Home / Self Care | Payer: Medicare HMO | Source: Ambulatory Visit | Attending: Cardiology | Admitting: Cardiology

## 2017-08-01 ENCOUNTER — Telehealth (HOSPITAL_COMMUNITY): Payer: Self-pay | Admitting: *Deleted

## 2017-08-01 DIAGNOSIS — I5022 Chronic systolic (congestive) heart failure: Secondary | ICD-10-CM | POA: Insufficient documentation

## 2017-08-01 LAB — HEPATIC FUNCTION PANEL
ALT: 33 U/L (ref 17–63)
AST: 35 U/L (ref 15–41)
Albumin: 3.5 g/dL (ref 3.5–5.0)
Alkaline Phosphatase: 111 U/L (ref 38–126)
Bilirubin, Direct: 0.1 mg/dL (ref 0.1–0.5)
Indirect Bilirubin: 0.3 mg/dL (ref 0.3–0.9)
Total Bilirubin: 0.4 mg/dL (ref 0.3–1.2)
Total Protein: 6.8 g/dL (ref 6.5–8.1)

## 2017-08-01 LAB — LIPID PANEL
Cholesterol: 69 mg/dL (ref 0–200)
HDL: 30 mg/dL — ABNORMAL LOW (ref >40)
LDL Cholesterol: 28 mg/dL (ref 0–99)
Total CHOL/HDL Ratio: 2.3 RATIO
Triglycerides: 53 mg/dL (ref <150)
VLDL: 11 mg/dL (ref 0–40)

## 2017-08-01 NOTE — Telephone Encounter (Signed)
Extremely unlikely to be due to Crestor.  Can try stopping for 2 weeks.  If pain resolves, stay off it.  If pain does not change, would go back on it.

## 2017-08-01 NOTE — Telephone Encounter (Signed)
Advanced Heart Failure Triage Encounter  Patient Name: [name] [name]   Date of Call: 08/01/2017  Problem:  Patients daughter called stating patient is still c/o severe pain in upper left arm that comes and goes and he wants to stop Crestor. Originally Dr.McLean ordered patient to stay on medication as "stabbing" pain in arm was more than likely not from Crestor.   Message routed to [place] for advice.   Plan:     Harvie Junior, CMA

## 2017-08-01 NOTE — Telephone Encounter (Signed)
pts daughter aware and agreeable with plan.

## 2017-08-02 ENCOUNTER — Encounter (HOSPITAL_COMMUNITY): Payer: Self-pay | Admitting: *Deleted

## 2017-09-03 ENCOUNTER — Other Ambulatory Visit: Payer: Self-pay | Admitting: Internal Medicine

## 2017-09-10 ENCOUNTER — Ambulatory Visit (INDEPENDENT_AMBULATORY_CARE_PROVIDER_SITE_OTHER): Payer: Medicare HMO | Admitting: Surgery

## 2017-09-10 ENCOUNTER — Encounter: Payer: Self-pay | Admitting: Surgery

## 2017-09-10 ENCOUNTER — Ambulatory Visit (HOSPITAL_COMMUNITY)
Admission: RE | Admit: 2017-09-10 | Discharge: 2017-09-10 | Disposition: A | Discharge status: Home / Self Care | Payer: Medicare HMO | Source: Ambulatory Visit | Attending: Surgery | Admitting: Surgery

## 2017-09-10 VITALS — BP 154/76 | HR 58 | Temp 97.3°F | Resp 20 | Ht 69.5 in | Wt 130.0 lb

## 2017-09-10 DIAGNOSIS — I6521 Occlusion and stenosis of right carotid artery: Secondary | ICD-10-CM

## 2017-09-10 DIAGNOSIS — I6529 Occlusion and stenosis of unspecified carotid artery: Secondary | ICD-10-CM | POA: Insufficient documentation

## 2017-09-10 LAB — VAS US CAROTID
LEFT ECA DIAS: -26 cm/s
Left CCA dist dias: -30 cm/s
Left CCA dist sys: -101 cm/s
Left CCA prox dias: 20 cm/s
Left CCA prox sys: 81 cm/s
Left ICA dist dias: -35 cm/s
Left ICA dist sys: -111 cm/s
Left ICA prox dias: -35 cm/s
Left ICA prox sys: -94 cm/s
RIGHT CCA MID DIAS: -21 cm/s
RIGHT ECA DIAS: -22 cm/s
Right CCA prox dias: 24 cm/s
Right CCA prox sys: 115 cm/s
Right cca dist sys: -83 cm/s

## 2017-09-10 NOTE — Progress Notes (Signed)
Vascular and Vein Specialist of [facility]  Patient name: [name] [name] MRN: [id number] DOB: [birth year] Sex: male   REASON FOR VISIT:    Follow up carotid  HISOTRY OF PRESENT ILLNESS:    [name] [name] is a 83 y.o. male who returns today for follow-up.  He is status post right carotid endarterectomy on 09/17/2015 for asymptomatic stenosis.  Intraoperative findings included 85% stenosis.  He continues to be without neurologic symptoms.  He denies numbness or weakness in either extremity.  He denies slurred speech.  He denies amaurosis fugax.  He has seen a improvement in his leukemia.  He has changed medications.  He continues to be on a statin for hypercholesterolemia and a ARB for blood pressure control.   PAST MEDICAL HISTORY:   Past Medical History:  Diagnosis Date  . Carotid artery occlusion   . Chronic systolic CHF (congestive heart failure) ([place])    a. 07/2013 Echo: EF 35-40%  . CKD (chronic kidney disease), stage III ([place])    pt. states that he does not have CKD  . CML (chronic myelocytic leukemia) ([place])   . Coronary artery disease    a. 09/2013 Cath: LM min irregs, LAD 95p(2.75x20 Promus Premier DES & 3.0x20 Promus Premier DES overlapping), D1 50-60, LCX 30p, OM1 79m, OM2 60-70p, RCA small, nl.  . Diverticulitis   . History of hiatal hernia   . History of kidney stones   . History of small bowel obstruction   . Hyperlipidemia   . Ischemic cardiomyopathy    a. 07/2013 Echo: EF 35-40%, apical septal, apical lat, apical AK, mod MR.  . Small bowel perforation ([place])    a. 11/2012 s/p emergent SB resection.     FAMILY HISTORY:   Family History  Problem Relation Age of Onset  . Emphysema Father        died @ 27- smoked  . Heart disease Father   . Kidney Stones Father   . Diabetes Mother        died @ 32  . Stroke Mother   . Head & neck cancer Brother   . Lymphoma Brother   . Colon cancer Neg Hx   . Stomach cancer Neg Hx   . Rectal  cancer Neg Hx   . Esophageal cancer Neg Hx   . Liver cancer Neg Hx     SOCIAL HISTORY:   Social History  Substance Use Topics  . Smoking status: Former Smoker    Packs/day: 1.00    Years: 45.00    Types: Cigarettes    Quit date: 06/09/1999  . Smokeless tobacco: Never Used  . Alcohol use No     ALLERGIES:   Allergies  Allergen Reactions  . Percocet [Oxycodone-Acetaminophen] Itching  . Metronidazole Other (See Comments)    Insomnia and nervousness     CURRENT MEDICATIONS:   Current Outpatient Prescriptions  Medication Sig Dispense Refill  . Ascorbic Acid (VITAMIN C PO) Take 500 mg by mouth daily.     Marland Kitchen aspirin EC 81 MG tablet Take 1 tablet (81 mg total) by mouth daily. 90 tablet 3  . BOSULIF 100 MG tablet     . Calcium Carb-Cholecalciferol (CALCIUM + D3) 600-200 MG-UNIT TABS Take by mouth daily.    . calcium carbonate (CALCIUM 600) 600 MG TABS tablet Take 600 mg by mouth daily.    . Calcium-Magnesium-Zinc 333-133-5 MG TABS Take by mouth daily.    . carvedilol (COREG) 12.5 MG tablet TAKE 1/2 TABLET TWICE DAILY WITH  A MEAL 30 tablet 6  . Coenzyme Q10 (CO Q10) 200 MG CAPS Take by mouth daily.    . CVS VITAMIN A PO Take 2,400 mcg by mouth daily.    . cyanocobalamin 1000 MCG tablet Take 1,000 mcg by mouth daily.    . furosemide (LASIX) 20 MG tablet Take 20 mg by mouth daily as needed for fluid or edema.    Marland Kitchen losartan (COZAAR) 25 MG tablet TAKE 1/2 TABLET EVERY DAY 15 tablet 3  . NIACIN PO Take 1 tablet by mouth daily.    . potassium chloride (K-DUR,KLOR-CON) 10 MEQ tablet Take 2 tablets (20 mEq total) by mouth daily. 60 tablet 6  . Probiotic Product (ALIGN PO) Take by mouth daily.    Marland Kitchen pyridoxine (B-6) 100 MG tablet Take 100 mg by mouth daily.    . rosuvastatin (CRESTOR) 5 MG tablet Take 1 tablet (5 mg total) by mouth every other day. 45 tablet 3  . Thiamine Mononitrate (VITAMIN B1 PO) Take 250 mg by mouth daily.    Marland Kitchen imatinib (GLEEVEC) 400 MG tablet Take 400 mg by mouth  daily. Take with meals and large glass of water.Caution:Chemotherapy.    Marland Kitchen Respiratory Therapy Supplies (FLUTTER) DEVI Use as directed (Patient not taking: Reported on 09/10/2017) 1 each 0   No current facility-administered medications for this visit.     REVIEW OF SYSTEMS:   [X]  denotes positive finding, [ ]  denotes negative finding Cardiac  Comments:  Chest pain or chest pressure:    Shortness of breath upon exertion:    Short of breath when lying flat:    Irregular heart rhythm:        Vascular    Pain in calf, thigh, or hip brought on by ambulation:    Pain in feet at night that wakes you up from your sleep:     Blood clot in your veins:    Leg swelling:  x       Pulmonary    Oxygen at home:    Productive cough:     Wheezing:         Neurologic    Sudden weakness in arms or legs:     Sudden numbness in arms or legs:     Sudden onset of difficulty speaking or slurred speech:    Temporary loss of vision in one eye:     Problems with dizziness:         Gastrointestinal    Blood in stool:     Vomited blood:         Genitourinary    Burning when urinating:     Blood in urine:        Psychiatric    Major depression:         Hematologic    Bleeding problems:    Problems with blood clotting too easily:        Skin    Rashes or ulcers:        Constitutional    Fever or chills:      PHYSICAL EXAM:   Vitals:   09/10/17 1410 09/10/17 1411  BP: (!) 169/71 (!) 154/76  Pulse: (!) 58   Resp: 20   Temp: (!) 97.3 F (36.3 C)   TempSrc: Oral   SpO2: 98%   Weight: 130 lb (59 kg)   Height: 5' 9.5" (1.765 m)     GENERAL: The patient is a well-nourished male, in no acute distress. The vital signs are documented  above. CARDIAC: There is a regular rate and rhythm.  PULMONARY: Non-labored respirations MUSCULOSKELETAL: There are no major deformities or cyanosis. NEUROLOGIC: No focal weakness or paresthesias are detected. SKIN: There are no ulcers or rashes  noted. PSYCHIATRIC: The patient has a normal affect.  STUDIES:   I have ordered and reviewed his vascular lab studies.  No significant stenosis is seen in the right side and 1-39 percent stenosis of the left  MEDICAL ISSUES:   Status post right carotid endarterectomy.  Patient will continue with annual surveillance.    Annamarie Major, MD Vascular and Vein Specialists of [facility] [phone] Pager [phone]

## 2017-09-25 NOTE — Addendum Note (Signed)
Addended by: Lianne Cure A on: 09/25/2017 03:08 PM   Modules accepted: Orders

## 2017-10-13 ENCOUNTER — Other Ambulatory Visit: Payer: Self-pay

## 2017-10-13 ENCOUNTER — Emergency Department (HOSPITAL_BASED_OUTPATIENT_CLINIC_OR_DEPARTMENT_OTHER): Payer: Medicare HMO

## 2017-10-13 ENCOUNTER — Encounter (HOSPITAL_BASED_OUTPATIENT_CLINIC_OR_DEPARTMENT_OTHER): Payer: Self-pay | Admitting: *Deleted

## 2017-10-13 ENCOUNTER — Inpatient Hospital Stay (HOSPITAL_BASED_OUTPATIENT_CLINIC_OR_DEPARTMENT_OTHER)
Admission: EM | Admit: 2017-10-13 | Discharge: 2017-10-19 | DRG: 329 | Disposition: A | Discharge status: Home / Self Care | Payer: Medicare HMO | Attending: General Surgery | Admitting: General Surgery

## 2017-10-13 DIAGNOSIS — Z823 Family history of stroke: Secondary | ICD-10-CM

## 2017-10-13 DIAGNOSIS — C9210 Chronic myeloid leukemia, BCR/ABL-positive, not having achieved remission: Secondary | ICD-10-CM | POA: Diagnosis present

## 2017-10-13 DIAGNOSIS — R0989 Other specified symptoms and signs involving the circulatory and respiratory systems: Secondary | ICD-10-CM

## 2017-10-13 DIAGNOSIS — H9190 Unspecified hearing loss, unspecified ear: Secondary | ICD-10-CM | POA: Diagnosis present

## 2017-10-13 DIAGNOSIS — I351 Nonrheumatic aortic (valve) insufficiency: Secondary | ICD-10-CM | POA: Diagnosis present

## 2017-10-13 DIAGNOSIS — K562 Volvulus: Secondary | ICD-10-CM | POA: Diagnosis not present

## 2017-10-13 DIAGNOSIS — Z87442 Personal history of urinary calculi: Secondary | ICD-10-CM

## 2017-10-13 DIAGNOSIS — C9110 Chronic lymphocytic leukemia of B-cell type not having achieved remission: Secondary | ICD-10-CM | POA: Diagnosis present

## 2017-10-13 DIAGNOSIS — Z7982 Long term (current) use of aspirin: Secondary | ICD-10-CM

## 2017-10-13 DIAGNOSIS — I130 Hypertensive heart and chronic kidney disease with heart failure and stage 1 through stage 4 chronic kidney disease, or unspecified chronic kidney disease: Secondary | ICD-10-CM | POA: Diagnosis present

## 2017-10-13 DIAGNOSIS — Z9049 Acquired absence of other specified parts of digestive tract: Secondary | ICD-10-CM

## 2017-10-13 DIAGNOSIS — Z833 Family history of diabetes mellitus: Secondary | ICD-10-CM

## 2017-10-13 DIAGNOSIS — J811 Chronic pulmonary edema: Secondary | ICD-10-CM

## 2017-10-13 DIAGNOSIS — Z885 Allergy status to narcotic agent status: Secondary | ICD-10-CM

## 2017-10-13 DIAGNOSIS — Z87891 Personal history of nicotine dependence: Secondary | ICD-10-CM

## 2017-10-13 DIAGNOSIS — I255 Ischemic cardiomyopathy: Secondary | ICD-10-CM | POA: Diagnosis present

## 2017-10-13 DIAGNOSIS — R1084 Generalized abdominal pain: Secondary | ICD-10-CM | POA: Diagnosis not present

## 2017-10-13 DIAGNOSIS — I5043 Acute on chronic combined systolic (congestive) and diastolic (congestive) heart failure: Secondary | ICD-10-CM | POA: Diagnosis present

## 2017-10-13 DIAGNOSIS — Z825 Family history of asthma and other chronic lower respiratory diseases: Secondary | ICD-10-CM

## 2017-10-13 DIAGNOSIS — Z807 Family history of other malignant neoplasms of lymphoid, hematopoietic and related tissues: Secondary | ICD-10-CM

## 2017-10-13 DIAGNOSIS — Z881 Allergy status to other antibiotic agents status: Secondary | ICD-10-CM

## 2017-10-13 DIAGNOSIS — Z8249 Family history of ischemic heart disease and other diseases of the circulatory system: Secondary | ICD-10-CM

## 2017-10-13 DIAGNOSIS — E785 Hyperlipidemia, unspecified: Secondary | ICD-10-CM | POA: Diagnosis present

## 2017-10-13 DIAGNOSIS — N179 Acute kidney failure, unspecified: Secondary | ICD-10-CM | POA: Diagnosis not present

## 2017-10-13 DIAGNOSIS — Q433 Congenital malformations of intestinal fixation: Secondary | ICD-10-CM

## 2017-10-13 DIAGNOSIS — I2510 Atherosclerotic heart disease of native coronary artery without angina pectoris: Secondary | ICD-10-CM | POA: Diagnosis present

## 2017-10-13 DIAGNOSIS — J9601 Acute respiratory failure with hypoxia: Secondary | ICD-10-CM | POA: Diagnosis not present

## 2017-10-13 DIAGNOSIS — K449 Diaphragmatic hernia without obstruction or gangrene: Secondary | ICD-10-CM | POA: Diagnosis present

## 2017-10-13 DIAGNOSIS — J439 Emphysema, unspecified: Secondary | ICD-10-CM | POA: Diagnosis present

## 2017-10-13 DIAGNOSIS — Z955 Presence of coronary angioplasty implant and graft: Secondary | ICD-10-CM

## 2017-10-13 DIAGNOSIS — N183 Chronic kidney disease, stage 3 (moderate): Secondary | ICD-10-CM | POA: Diagnosis present

## 2017-10-13 LAB — CBC
HCT: 38.7 % — ABNORMAL LOW (ref 39.0–52.0)
Hemoglobin: 12.5 g/dL — ABNORMAL LOW (ref 13.0–17.0)
MCH: 28.7 pg (ref 26.0–34.0)
MCHC: 32.3 g/dL (ref 30.0–36.0)
MCV: 88.8 fL (ref 78.0–100.0)
Platelets: 167 10*3/uL (ref 150–400)
RBC: 4.36 MIL/uL (ref 4.22–5.81)
RDW: 15.8 % — ABNORMAL HIGH (ref 11.5–15.5)
WBC: 10 10*3/uL (ref 4.0–10.5)

## 2017-10-13 LAB — COMPREHENSIVE METABOLIC PANEL
ALT: 47 U/L (ref 17–63)
AST: 45 U/L — ABNORMAL HIGH (ref 15–41)
Albumin: 3.9 g/dL (ref 3.5–5.0)
Alkaline Phosphatase: 116 U/L (ref 38–126)
Anion gap: 7 (ref 5–15)
BUN: 25 mg/dL — ABNORMAL HIGH (ref 6–20)
CO2: 20 mmol/L — ABNORMAL LOW (ref 22–32)
Calcium: 8.5 mg/dL — ABNORMAL LOW (ref 8.9–10.3)
Chloride: 109 mmol/L (ref 101–111)
Creatinine, Ser: 1.48 mg/dL — ABNORMAL HIGH (ref 0.61–1.24)
GFR calc Af Amer: 48 mL/min — ABNORMAL LOW (ref >60)
GFR calc non Af Amer: 42 mL/min — ABNORMAL LOW (ref >60)
Glucose, Bld: 113 mg/dL — ABNORMAL HIGH (ref 65–99)
Potassium: 4 mmol/L (ref 3.5–5.1)
Sodium: 136 mmol/L (ref 135–145)
Total Bilirubin: 0.4 mg/dL (ref 0.3–1.2)
Total Protein: 7.1 g/dL (ref 6.5–8.1)

## 2017-10-13 LAB — URINALYSIS, ROUTINE W REFLEX MICROSCOPIC
Bilirubin Urine: NEGATIVE
Glucose, UA: NEGATIVE mg/dL
Hgb urine dipstick: NEGATIVE
Ketones, ur: NEGATIVE mg/dL
Leukocytes, UA: NEGATIVE
Nitrite: NEGATIVE
Protein, ur: 100 mg/dL — AB
Specific Gravity, Urine: >1.03 — ABNORMAL HIGH (ref 1.005–1.030)
pH: 6 (ref 5.0–8.0)

## 2017-10-13 LAB — URINALYSIS, MICROSCOPIC (REFLEX): Bacteria, UA: NONE SEEN

## 2017-10-13 LAB — LIPASE, BLOOD: Lipase: 35 U/L (ref 11–51)

## 2017-10-13 LAB — TROPONIN I: Troponin I: 0.03 ng/mL (ref <0.03)

## 2017-10-13 MED ORDER — SODIUM CHLORIDE 0.9 % IV BOLUS (SEPSIS)
1000.0000 mL | Freq: Once | INTRAVENOUS | Status: AC
  Administered 2017-10-13: 1000 mL via INTRAVENOUS

## 2017-10-13 MED ORDER — GI COCKTAIL ~~LOC~~
30.0000 mL | Freq: Once | ORAL | Status: AC
  Administered 2017-10-13: 30 mL via ORAL
  Filled 2017-10-13: qty 30

## 2017-10-13 MED ORDER — FENTANYL CITRATE (PF) 100 MCG/2ML IJ SOLN
50.0000 ug | Freq: Once | INTRAMUSCULAR | Status: AC
  Administered 2017-10-13: 50 ug via INTRAVENOUS
  Filled 2017-10-13: qty 2

## 2017-10-13 NOTE — ED Triage Notes (Signed)
Pt reports upper abd pain since Wednesday night. Vomited x 1. Reports last bm today "very dark". C/o back pain also

## 2017-10-14 ENCOUNTER — Other Ambulatory Visit: Payer: Self-pay

## 2017-10-14 ENCOUNTER — Emergency Department (HOSPITAL_COMMUNITY): Payer: Medicare HMO | Admitting: Registered Nurse

## 2017-10-14 ENCOUNTER — Encounter (HOSPITAL_BASED_OUTPATIENT_CLINIC_OR_DEPARTMENT_OTHER): Payer: Self-pay | Admitting: Emergency Medicine

## 2017-10-14 ENCOUNTER — Encounter (HOSPITAL_COMMUNITY): Admission: EM | Disposition: A | Discharge status: Home / Self Care | Payer: Self-pay | Source: Home / Self Care

## 2017-10-14 ENCOUNTER — Inpatient Hospital Stay (HOSPITAL_COMMUNITY): Payer: Medicare HMO

## 2017-10-14 DIAGNOSIS — R1084 Generalized abdominal pain: Secondary | ICD-10-CM | POA: Diagnosis present

## 2017-10-14 DIAGNOSIS — Z87891 Personal history of nicotine dependence: Secondary | ICD-10-CM | POA: Diagnosis not present

## 2017-10-14 DIAGNOSIS — N183 Chronic kidney disease, stage 3 (moderate): Secondary | ICD-10-CM | POA: Diagnosis present

## 2017-10-14 DIAGNOSIS — Z825 Family history of asthma and other chronic lower respiratory diseases: Secondary | ICD-10-CM | POA: Diagnosis not present

## 2017-10-14 DIAGNOSIS — N170 Acute kidney failure with tubular necrosis: Secondary | ICD-10-CM | POA: Diagnosis not present

## 2017-10-14 DIAGNOSIS — Z87442 Personal history of urinary calculi: Secondary | ICD-10-CM | POA: Diagnosis not present

## 2017-10-14 DIAGNOSIS — Z955 Presence of coronary angioplasty implant and graft: Secondary | ICD-10-CM | POA: Diagnosis not present

## 2017-10-14 DIAGNOSIS — K562 Volvulus: Secondary | ICD-10-CM | POA: Diagnosis present

## 2017-10-14 DIAGNOSIS — Z7982 Long term (current) use of aspirin: Secondary | ICD-10-CM | POA: Diagnosis not present

## 2017-10-14 DIAGNOSIS — R0989 Other specified symptoms and signs involving the circulatory and respiratory systems: Secondary | ICD-10-CM | POA: Diagnosis not present

## 2017-10-14 DIAGNOSIS — C9110 Chronic lymphocytic leukemia of B-cell type not having achieved remission: Secondary | ICD-10-CM | POA: Diagnosis present

## 2017-10-14 DIAGNOSIS — R748 Abnormal levels of other serum enzymes: Secondary | ICD-10-CM | POA: Diagnosis not present

## 2017-10-14 DIAGNOSIS — Z9049 Acquired absence of other specified parts of digestive tract: Secondary | ICD-10-CM | POA: Diagnosis not present

## 2017-10-14 DIAGNOSIS — I2510 Atherosclerotic heart disease of native coronary artery without angina pectoris: Secondary | ICD-10-CM | POA: Diagnosis present

## 2017-10-14 DIAGNOSIS — I5043 Acute on chronic combined systolic (congestive) and diastolic (congestive) heart failure: Secondary | ICD-10-CM | POA: Diagnosis present

## 2017-10-14 DIAGNOSIS — Q433 Congenital malformations of intestinal fixation: Secondary | ICD-10-CM

## 2017-10-14 DIAGNOSIS — N179 Acute kidney failure, unspecified: Secondary | ICD-10-CM | POA: Diagnosis not present

## 2017-10-14 DIAGNOSIS — K449 Diaphragmatic hernia without obstruction or gangrene: Secondary | ICD-10-CM | POA: Diagnosis present

## 2017-10-14 DIAGNOSIS — J9601 Acute respiratory failure with hypoxia: Secondary | ICD-10-CM | POA: Diagnosis not present

## 2017-10-14 DIAGNOSIS — I130 Hypertensive heart and chronic kidney disease with heart failure and stage 1 through stage 4 chronic kidney disease, or unspecified chronic kidney disease: Secondary | ICD-10-CM | POA: Diagnosis present

## 2017-10-14 DIAGNOSIS — Z823 Family history of stroke: Secondary | ICD-10-CM | POA: Diagnosis not present

## 2017-10-14 DIAGNOSIS — I255 Ischemic cardiomyopathy: Secondary | ICD-10-CM | POA: Diagnosis present

## 2017-10-14 DIAGNOSIS — I10 Essential (primary) hypertension: Secondary | ICD-10-CM | POA: Diagnosis not present

## 2017-10-14 DIAGNOSIS — Z881 Allergy status to other antibiotic agents status: Secondary | ICD-10-CM | POA: Diagnosis not present

## 2017-10-14 DIAGNOSIS — J810 Acute pulmonary edema: Secondary | ICD-10-CM | POA: Diagnosis not present

## 2017-10-14 DIAGNOSIS — E785 Hyperlipidemia, unspecified: Secondary | ICD-10-CM | POA: Diagnosis present

## 2017-10-14 DIAGNOSIS — Z885 Allergy status to narcotic agent status: Secondary | ICD-10-CM | POA: Diagnosis not present

## 2017-10-14 DIAGNOSIS — Z833 Family history of diabetes mellitus: Secondary | ICD-10-CM | POA: Diagnosis not present

## 2017-10-14 DIAGNOSIS — Z807 Family history of other malignant neoplasms of lymphoid, hematopoietic and related tissues: Secondary | ICD-10-CM | POA: Diagnosis not present

## 2017-10-14 DIAGNOSIS — I351 Nonrheumatic aortic (valve) insufficiency: Secondary | ICD-10-CM | POA: Diagnosis not present

## 2017-10-14 DIAGNOSIS — Z8249 Family history of ischemic heart disease and other diseases of the circulatory system: Secondary | ICD-10-CM | POA: Diagnosis not present

## 2017-10-14 DIAGNOSIS — C9210 Chronic myeloid leukemia, BCR/ABL-positive, not having achieved remission: Secondary | ICD-10-CM | POA: Diagnosis present

## 2017-10-14 HISTORY — PX: LAPAROTOMY: SHX154

## 2017-10-14 LAB — MRSA PCR SCREENING: MRSA by PCR: NEGATIVE

## 2017-10-14 LAB — I-STAT CG4 LACTIC ACID, ED: Lactic Acid, Venous: 0.6 mmol/L (ref 0.5–1.9)

## 2017-10-14 LAB — TROPONIN I: Troponin I: 0.03 ng/mL (ref <0.03)

## 2017-10-14 IMAGING — CT CT ABD-PELV W/ CM
2 of 5 series · 14 of 46 positions shown, 16 images · IV contrast (APPLIED)
Comparison: None.

CLINICAL DATA: Mid to upper abdominal pain for few days. History of
small bowel perforation and renal insufficiency.

EXAM:
CT ABDOMEN AND PELVIS WITH CONTRAST
TECHNIQUE: Multidetector CT imaging of the abdomen and pelvis was performed
using the standard protocol following bolus administration of
intravenous contrast.
CONTRAST:  80mL [7P] IOPAMIDOL ([7P]) INJECTION 61%

[Series 2: axial st · axial · 0.73mm/px · z∈[-512,-148]mm · 11 of 83 slices shown, 13 images]
[im 5/83  soft-tissue]
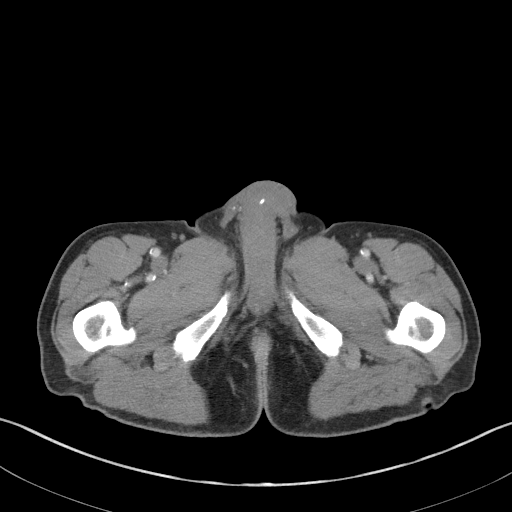
[im 5/83  bone]
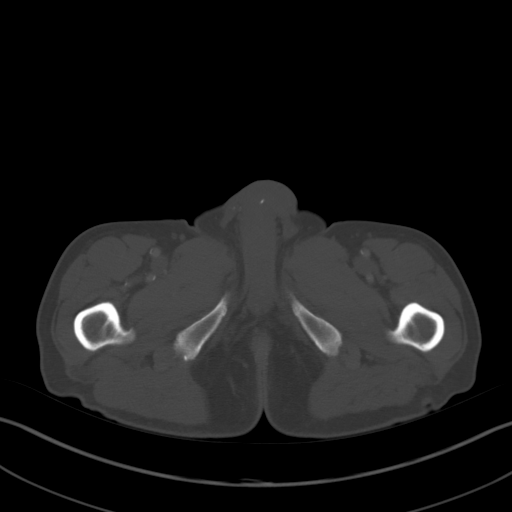
[im 13/83  soft-tissue]
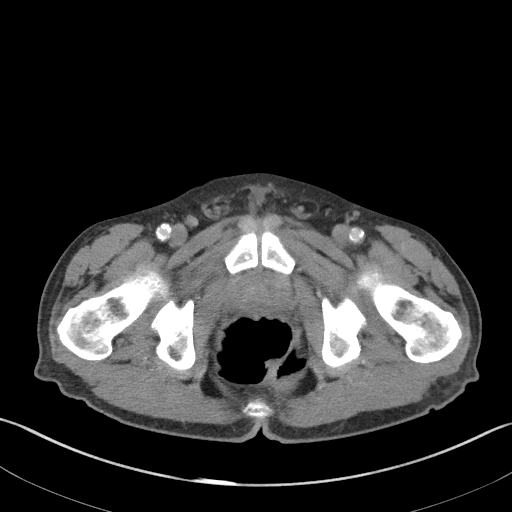
[im 22/83  soft-tissue]
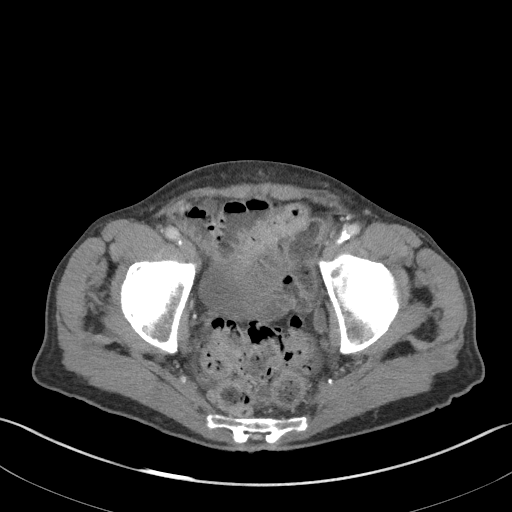
[im 26/83  soft-tissue]
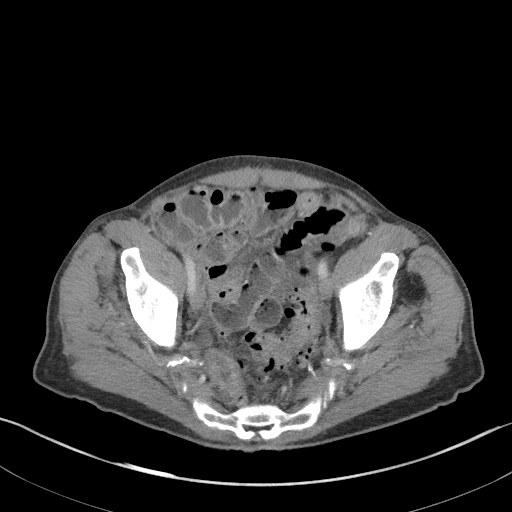
[im 35/83  soft-tissue]
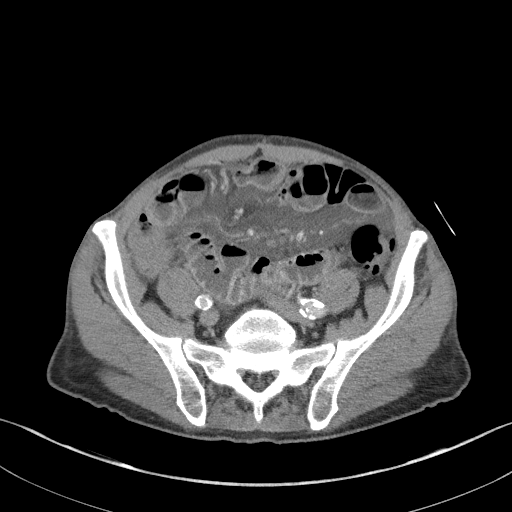
[im 44/83  soft-tissue]
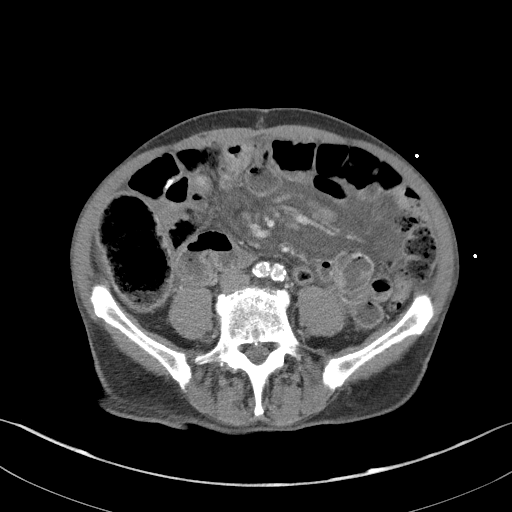
[im 48/83  soft-tissue]
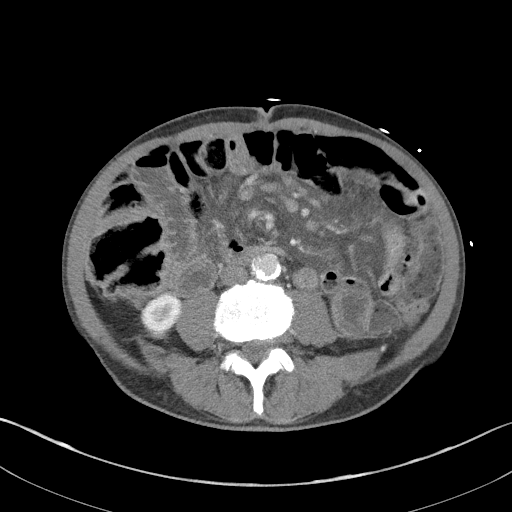
[im 57/83  soft-tissue]
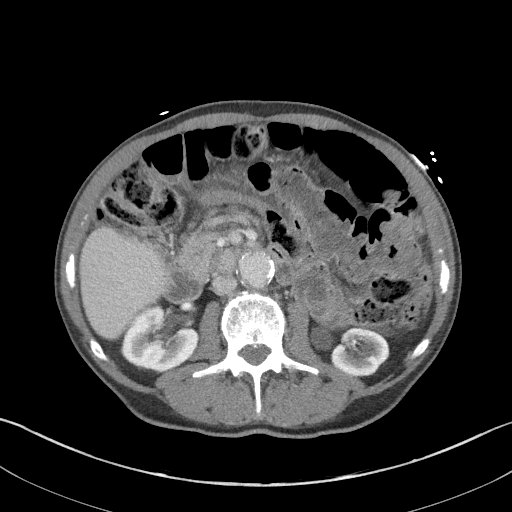
[im 61/83  soft-tissue]
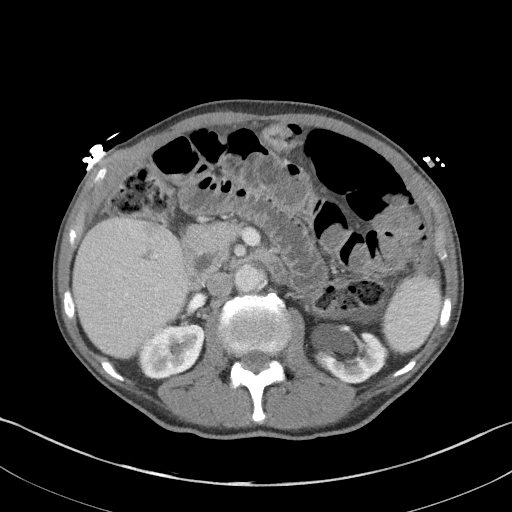
[im 61/83  bone]
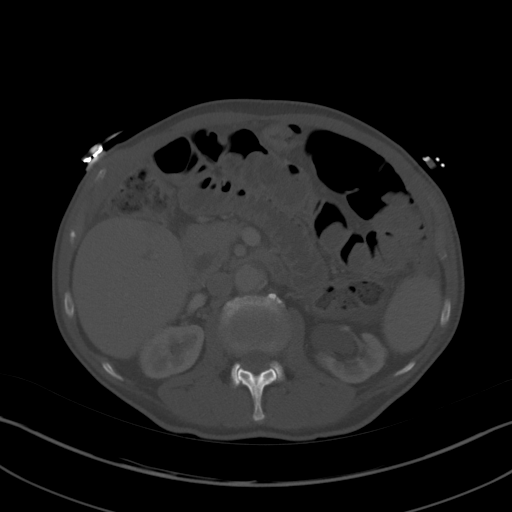
[im 70/83  soft-tissue]
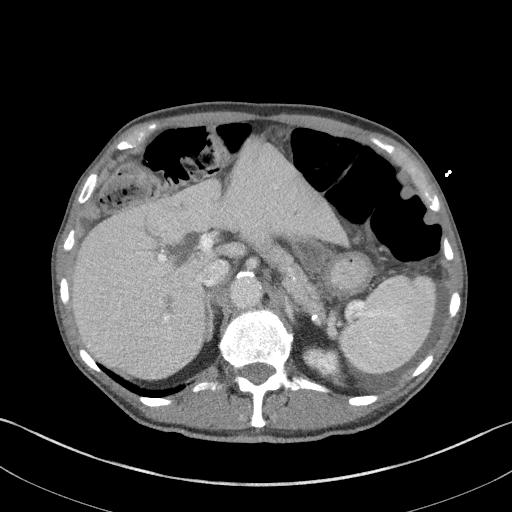
[im 78/83  soft-tissue]
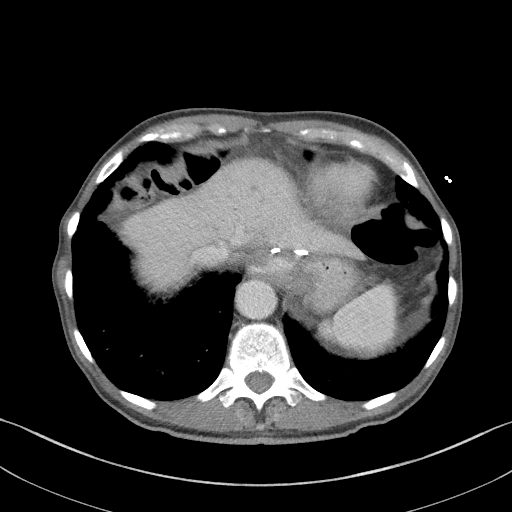

[Series 6: coronal st · coronal · 0.65mm/px · 3 of 84 slices shown]
[im 28/84  soft-tissue]
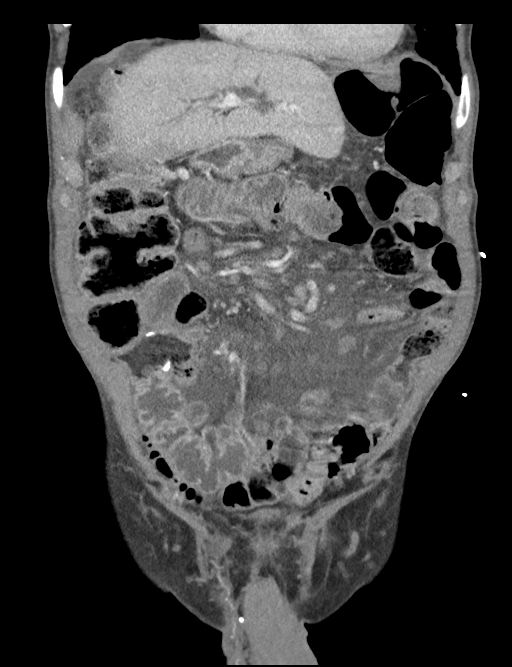
[im 37/84  soft-tissue]
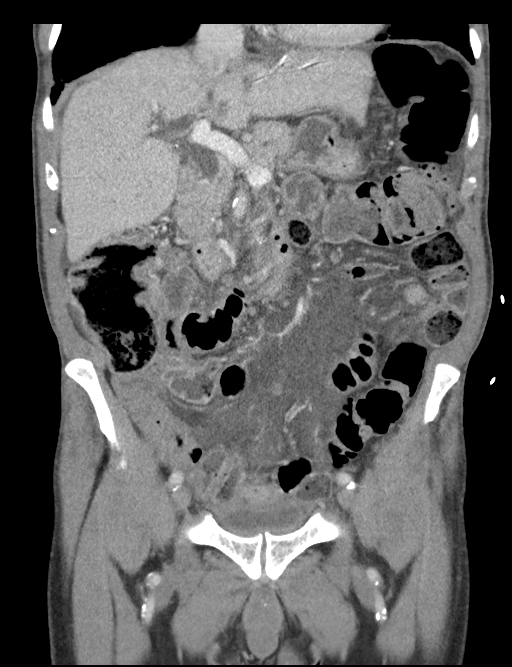
[im 47/84  soft-tissue]
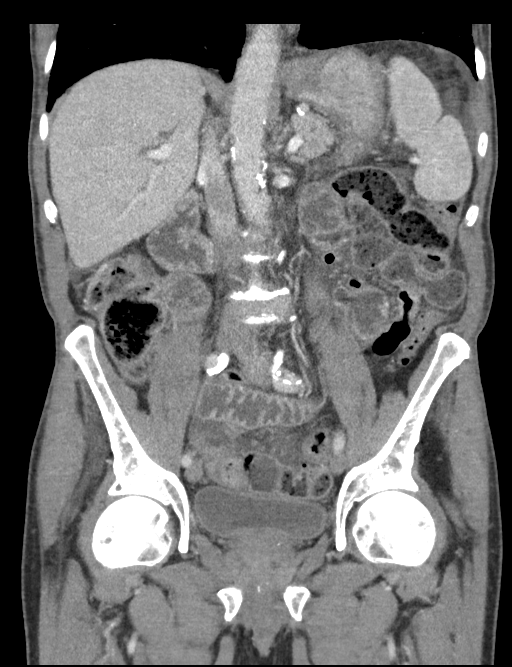

[14 of 46 positions shown; findings below may reference images not displayed]

FINDINGS: Lower chest: Normal size heart without pericardial effusion.
Subpleural fibrosis noted at each lung base. Mild centrilobular
emphysema and paraseptal emphysema.

Hepatobiliary: Status post cholecystectomy which may account for the
chronic intra and extrahepatic ductal dilatation. No
choledocholithiasis. No space-occupying mass the liver. There is
colonic interposition noted.

Pancreas: Normal pancreas without mass. The main pancreatic duct is
visualized but is normal in caliber.

Spleen: No splenomegaly or mass.

Adrenals/Urinary Tract: Normal bilateral adrenal glands. Mild
atrophy of left kidney. No enhancing renal masses. Nonobstructing
left-sided renal calculi in the upper and lower pole, the largest
measuring approximately 6 x 3 mm in the lower pole. Chronic ectasia
of the intrarenal collecting system on the left with bilateral
extrarenal pelves. Distal left ureteral stone is stable measuring 4
mm adjacent to the medial wall of the left acetabulum. No
hydroureteronephrosis is apparent. The urinary bladder is
physiologically distended.

Stomach/Bowel: Small hiatal hernia with decompressed stomach. New
mild-to-moderately distended small bowel loops are identified with
twisting of the small bowel mesentery concerning for midgut volvulus
contributing to changes of early small-bowel obstruction. The
patient is status post small bowel resection with enteric sutures
now noted in the right hemiabdomen, previously seen in the left
hemiabdomen and may be a reflection of the mesenteric twist.
Desiccated stool is noted within large bowel. The appendix is not
discretely identified. Moderate sigmoid diverticulosis.

Vascular/Lymphatic: There are mildly enlarged subcentimeter short
axis mesenteric lymph nodes surrounded by hazy mesenteric edema. A
lymphoproliferative disorder is not excluded versus reactive
mesenteric adenopathy. These lymph nodes are approximately 1 cm
short axis but more prominent than on prior.

Reproductive: Enlarged prostate is stable measuring 5.8 x 3.7 cm in
transverse by AP dimension.

Other: Small amount of free fluid is noted in the pelvis. Hazy
mesenteric edema is noted.

Musculoskeletal: Lumbar spondylitic degenerative disc disease L3-4
and to a greater extent L4-5. No acute osseous abnormality.
IMPRESSION: 1. Swirling of the mesentery with dilatation of small bowel raises
concern for midgut volvulus. No bowel perforation. Mesenteric edema
is noted at the root of the mesentery with mildly enlarged
mesenteric lymph nodes possibly representing a lymphoproliferative
state. New small volume of ascites is seen in the lower pelvis.
These results were called by telephone at the time of interpretation
on [DATE] at [DATE] to Dr. PARAGON , who verbally
acknowledged these results.
2. Chronic mild atrophy of the left kidney with chronic
hydroureteronephrosis. Left-sided nephrolithiasis and distal left
pelvic ureteral stones are stable.
3. Status post cholecystectomy with chronic intra and extrahepatic
ductal dilatation possibly representing post cholecystectomy change
and reservoir effect.
4. Lumbar spondylosis.
5. Paraseptal and centrilobular emphysema at the lung bases.

## 2017-10-14 IMAGING — DX DG CHEST 1V PORT
1 series · 1 of 1 positions shown · non-contrast
Comparison: Radiographs [DATE].

CLINICAL DATA: Shortness of breath.

EXAM:
PORTABLE CHEST 1 VIEW

[chest ap]
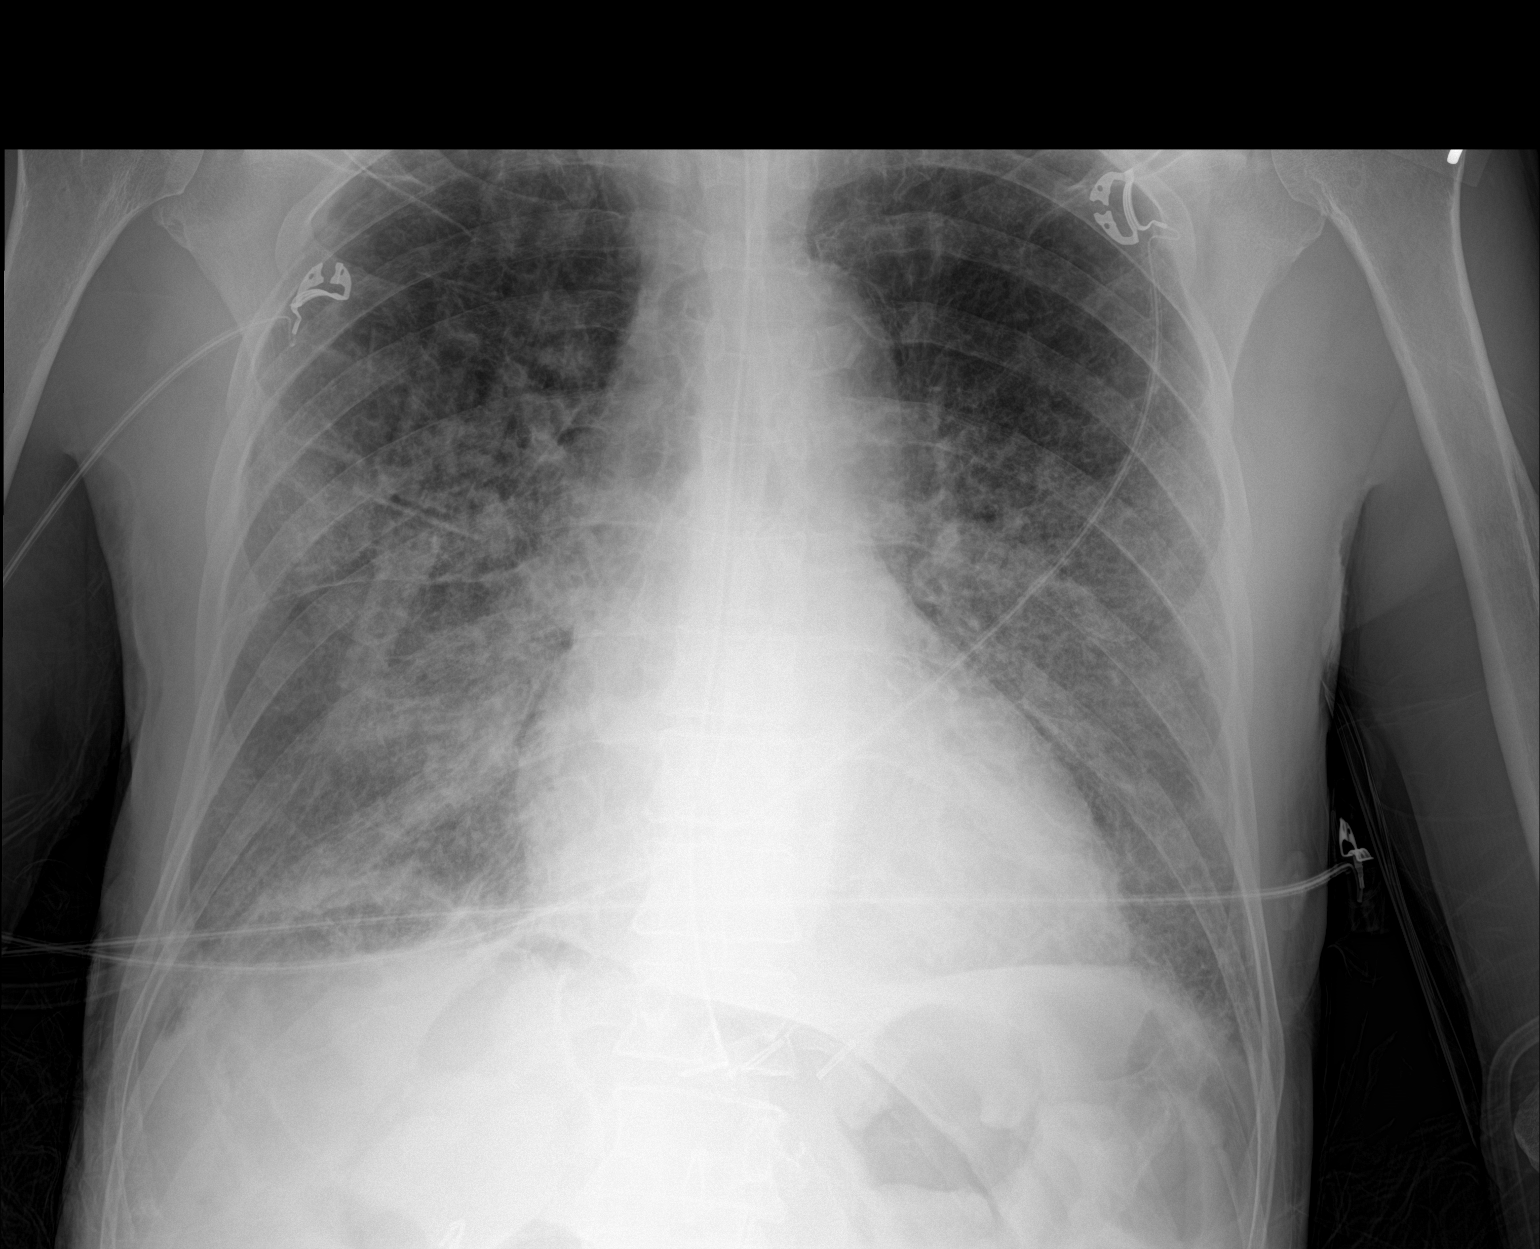

[1 of 1 positions shown; findings below may reference images not displayed]

FINDINGS: Stable cardiomediastinal silhouette. Atherosclerosis of thoracic
aorta is noted. Interval placement of nasogastric tube with distal
tip in expected position of gastroesophageal junction. Increased
bilateral diffuse interstitial densities are noted throughout both
lungs concerning for worsening pulmonary edema or atypical
inflammation. No pneumothorax is noted. Bony thorax is unremarkable.
IMPRESSION: Aortic atherosclerosis. Interval placement of a nasogastric tube
with distal tip in expected position of gastroesophageal junction.
Increased bilateral diffuse interstitial densities are noted
concerning for pulmonary edema or atypical inflammation.

## 2017-10-14 SURGERY — LAPAROTOMY, EXPLORATORY
Anesthesia: General | Site: Abdomen

## 2017-10-14 MED ORDER — ALBUTEROL SULFATE (2.5 MG/3ML) 0.083% IN NEBU
INHALATION_SOLUTION | RESPIRATORY_TRACT | Status: AC
  Filled 2017-10-14: qty 3

## 2017-10-14 MED ORDER — SUGAMMADEX SODIUM 200 MG/2ML IV SOLN
INTRAVENOUS | Status: AC
  Filled 2017-10-14: qty 2

## 2017-10-14 MED ORDER — PROPOFOL 10 MG/ML IV BOLUS
INTRAVENOUS | Status: AC
  Filled 2017-10-14: qty 20

## 2017-10-14 MED ORDER — LIDOCAINE 2% (20 MG/ML) 5 ML SYRINGE
INTRAMUSCULAR | Status: AC
  Filled 2017-10-14: qty 5

## 2017-10-14 MED ORDER — ONDANSETRON 4 MG PO TBDP: 4.0000 mg | ORAL_TABLET | Freq: Four times a day (QID) | ORAL | Status: DC | PRN

## 2017-10-14 MED ORDER — FENTANYL CITRATE (PF) 250 MCG/5ML IJ SOLN
INTRAMUSCULAR | Status: AC
  Filled 2017-10-14: qty 5

## 2017-10-14 MED ORDER — ROCURONIUM BROMIDE 50 MG/5ML IV SOSY
PREFILLED_SYRINGE | INTRAVENOUS | Status: AC
  Filled 2017-10-14: qty 5

## 2017-10-14 MED ORDER — IPRATROPIUM-ALBUTEROL 0.5-2.5 (3) MG/3ML IN SOLN
3.0000 mL | Freq: Four times a day (QID) | RESPIRATORY_TRACT | Status: DC
  Administered 2017-10-15 – 2017-10-16 (×4): 3 mL via RESPIRATORY_TRACT
  Filled 2017-10-14 (×5): qty 3

## 2017-10-14 MED ORDER — ALBUMIN HUMAN 5 % IV SOLN: INTRAVENOUS | Status: DC | PRN

## 2017-10-14 MED ORDER — PHENYLEPHRINE HCL 10 MG/ML IJ SOLN
INTRAMUSCULAR | Status: DC | PRN
  Administered 2017-10-14 (×2): 80 ug via INTRAVENOUS

## 2017-10-14 MED ORDER — IPRATROPIUM-ALBUTEROL 0.5-2.5 (3) MG/3ML IN SOLN
3.0000 mL | RESPIRATORY_TRACT | Status: DC
  Administered 2017-10-14 (×3): 3 mL via RESPIRATORY_TRACT
  Filled 2017-10-14 (×3): qty 3

## 2017-10-14 MED ORDER — SUCCINYLCHOLINE CHLORIDE 20 MG/ML IJ SOLN
INTRAMUSCULAR | Status: DC | PRN
  Administered 2017-10-14: 120 mg via INTRAVENOUS

## 2017-10-14 MED ORDER — METOPROLOL TARTRATE 5 MG/5ML IV SOLN
5.0000 mg | Freq: Four times a day (QID) | INTRAVENOUS | Status: DC
  Administered 2017-10-14 – 2017-10-19 (×18): 5 mg via INTRAVENOUS
  Filled 2017-10-14 (×18): qty 5

## 2017-10-14 MED ORDER — FUROSEMIDE 10 MG/ML IJ SOLN
INTRAMUSCULAR | Status: AC
  Filled 2017-10-14: qty 2

## 2017-10-14 MED ORDER — ROCURONIUM BROMIDE 100 MG/10ML IV SOLN
INTRAVENOUS | Status: DC | PRN
  Administered 2017-10-14: 40 mg via INTRAVENOUS

## 2017-10-14 MED ORDER — DEXTROSE 5 % IV SOLN
2.0000 g | Freq: Once | INTRAVENOUS | Status: AC
  Administered 2017-10-14: 2 g via INTRAVENOUS
  Filled 2017-10-14: qty 2

## 2017-10-14 MED ORDER — ACETAMINOPHEN 10 MG/ML IV SOLN
INTRAVENOUS | Status: AC
  Filled 2017-10-14: qty 100

## 2017-10-14 MED ORDER — SUGAMMADEX SODIUM 200 MG/2ML IV SOLN
INTRAVENOUS | Status: DC | PRN
  Administered 2017-10-14: 120 mg via INTRAVENOUS

## 2017-10-14 MED ORDER — HYDROMORPHONE HCL 1 MG/ML IJ SOLN
1.0000 mg | INTRAMUSCULAR | Status: DC | PRN
  Administered 2017-10-14: 1 mg via INTRAVENOUS
  Administered 2017-10-14: 2 mg via INTRAVENOUS
  Administered 2017-10-14: 1 mg via INTRAVENOUS
  Administered 2017-10-15 – 2017-10-16 (×6): 2 mg via INTRAVENOUS
  Administered 2017-10-16: 1 mg via INTRAVENOUS
  Administered 2017-10-16: 2 mg via INTRAVENOUS
  Administered 2017-10-16: 1 mg via INTRAVENOUS
  Administered 2017-10-17: 2 mg via INTRAVENOUS
  Administered 2017-10-17: 1 mg via INTRAVENOUS
  Administered 2017-10-17 (×2): 2 mg via INTRAVENOUS
  Filled 2017-10-14 (×2): qty 2
  Filled 2017-10-14: qty 1
  Filled 2017-10-14: qty 2
  Filled 2017-10-14: qty 1
  Filled 2017-10-14 (×5): qty 2
  Filled 2017-10-14: qty 1
  Filled 2017-10-14: qty 2
  Filled 2017-10-14: qty 1
  Filled 2017-10-14 (×2): qty 2
  Filled 2017-10-14: qty 1
  Filled 2017-10-14: qty 2

## 2017-10-14 MED ORDER — DEXAMETHASONE SODIUM PHOSPHATE 10 MG/ML IJ SOLN
INTRAMUSCULAR | Status: DC | PRN
  Administered 2017-10-14: 6 mg via INTRAVENOUS

## 2017-10-14 MED ORDER — ONDANSETRON HCL 4 MG/2ML IJ SOLN
4.0000 mg | Freq: Four times a day (QID) | INTRAMUSCULAR | Status: DC | PRN
  Administered 2017-10-15: 4 mg via INTRAVENOUS
  Filled 2017-10-14: qty 2

## 2017-10-14 MED ORDER — SODIUM CHLORIDE 0.9 % IV BOLUS (SEPSIS)
500.0000 mL | Freq: Once | INTRAVENOUS | Status: AC
  Administered 2017-10-14: 500 mL via INTRAVENOUS

## 2017-10-14 MED ORDER — LIDOCAINE HCL (CARDIAC) 20 MG/ML IV SOLN
INTRAVENOUS | Status: DC | PRN
  Administered 2017-10-14: 50 mg via INTRAVENOUS
  Administered 2017-10-14: 25 mg via INTRATRACHEAL

## 2017-10-14 MED ORDER — ALBUTEROL SULFATE (2.5 MG/3ML) 0.083% IN NEBU
2.5000 mg | INHALATION_SOLUTION | Freq: Once | RESPIRATORY_TRACT | Status: AC
  Administered 2017-10-14: 2.5 mg via RESPIRATORY_TRACT

## 2017-10-14 MED ORDER — PANTOPRAZOLE SODIUM 40 MG IV SOLR
40.0000 mg | Freq: Every day | INTRAVENOUS | Status: DC
  Administered 2017-10-14 – 2017-10-18 (×5): 40 mg via INTRAVENOUS
  Filled 2017-10-14 (×5): qty 40

## 2017-10-14 MED ORDER — PHENYLEPHRINE HCL 10 MG/ML IJ SOLN
INTRAMUSCULAR | Status: DC | PRN
  Administered 2017-10-14: 30 ug/min via INTRAVENOUS

## 2017-10-14 MED ORDER — 0.9 % SODIUM CHLORIDE (POUR BTL) OPTIME
TOPICAL | Status: DC | PRN
  Administered 2017-10-14: 3000 mL

## 2017-10-14 MED ORDER — FENTANYL CITRATE (PF) 100 MCG/2ML IJ SOLN
25.0000 ug | INTRAMUSCULAR | Status: DC | PRN
  Administered 2017-10-14 (×4): 25 ug via INTRAVENOUS

## 2017-10-14 MED ORDER — DEXAMETHASONE SODIUM PHOSPHATE 10 MG/ML IJ SOLN
INTRAMUSCULAR | Status: AC
  Filled 2017-10-14: qty 1

## 2017-10-14 MED ORDER — PHENYLEPHRINE 40 MCG/ML (10ML) SYRINGE FOR IV PUSH (FOR BLOOD PRESSURE SUPPORT)
PREFILLED_SYRINGE | INTRAVENOUS | Status: AC
  Filled 2017-10-14: qty 10

## 2017-10-14 MED ORDER — FUROSEMIDE 10 MG/ML IJ SOLN
20.0000 mg | Freq: Once | INTRAMUSCULAR | Status: AC
  Administered 2017-10-14: 20 mg via INTRAVENOUS
  Filled 2017-10-14: qty 2

## 2017-10-14 MED ORDER — ONDANSETRON HCL 4 MG/2ML IJ SOLN
4.0000 mg | Freq: Once | INTRAMUSCULAR | Status: AC
  Administered 2017-10-14: 4 mg via INTRAVENOUS
  Filled 2017-10-14: qty 2

## 2017-10-14 MED ORDER — ONDANSETRON HCL 4 MG/2ML IJ SOLN
INTRAMUSCULAR | Status: DC | PRN
  Administered 2017-10-14: 4 mg via INTRAVENOUS

## 2017-10-14 MED ORDER — ALBUMIN HUMAN 5 % IV SOLN
INTRAVENOUS | Status: AC
  Filled 2017-10-14: qty 250

## 2017-10-14 MED ORDER — FENTANYL CITRATE (PF) 100 MCG/2ML IJ SOLN
INTRAMUSCULAR | Status: DC | PRN
  Administered 2017-10-14: 50 ug via INTRAVENOUS
  Administered 2017-10-14: 100 ug via INTRAVENOUS
  Administered 2017-10-14 (×2): 50 ug via INTRAVENOUS
  Administered 2017-10-14: 100 ug via INTRAVENOUS
  Administered 2017-10-14 (×2): 50 ug via INTRAVENOUS

## 2017-10-14 MED ORDER — PROPOFOL 10 MG/ML IV BOLUS
INTRAVENOUS | Status: DC | PRN
  Administered 2017-10-14: 30 mg via INTRAVENOUS
  Administered 2017-10-14: 100 mg via INTRAVENOUS

## 2017-10-14 MED ORDER — DIPHENHYDRAMINE HCL 50 MG/ML IJ SOLN
INTRAMUSCULAR | Status: AC
  Filled 2017-10-14: qty 1

## 2017-10-14 MED ORDER — IOPAMIDOL (ISOVUE-300) INJECTION 61%
100.0000 mL | Freq: Once | INTRAVENOUS | Status: AC | PRN
  Administered 2017-10-14: 80 mL via INTRAVENOUS

## 2017-10-14 MED ORDER — ACETAMINOPHEN 10 MG/ML IV SOLN
INTRAVENOUS | Status: DC | PRN
  Administered 2017-10-14: 1000 mg via INTRAVENOUS

## 2017-10-14 MED ORDER — SUCCINYLCHOLINE CHLORIDE 200 MG/10ML IV SOSY
PREFILLED_SYRINGE | INTRAVENOUS | Status: AC
  Filled 2017-10-14: qty 10

## 2017-10-14 MED ORDER — LACTATED RINGERS IV SOLN
INTRAVENOUS | Status: DC | PRN
  Administered 2017-10-14: 05:00:00 via INTRAVENOUS

## 2017-10-14 MED ORDER — FENTANYL CITRATE (PF) 100 MCG/2ML IJ SOLN
INTRAMUSCULAR | Status: AC
  Filled 2017-10-14: qty 2

## 2017-10-14 MED ORDER — FENTANYL CITRATE (PF) 100 MCG/2ML IJ SOLN
50.0000 ug | Freq: Once | INTRAMUSCULAR | Status: AC
  Administered 2017-10-14: 50 ug via INTRAVENOUS
  Filled 2017-10-14: qty 2

## 2017-10-14 MED ORDER — ENOXAPARIN SODIUM 40 MG/0.4ML ~~LOC~~ SOLN
40.0000 mg | SUBCUTANEOUS | Status: DC
  Administered 2017-10-15 – 2017-10-19 (×5): 40 mg via SUBCUTANEOUS
  Filled 2017-10-14 (×5): qty 0.4

## 2017-10-14 MED ORDER — EPHEDRINE SULFATE 50 MG/ML IJ SOLN
INTRAMUSCULAR | Status: DC | PRN
  Administered 2017-10-14: 10 mg via INTRAVENOUS

## 2017-10-14 MED ORDER — ONDANSETRON HCL 4 MG/2ML IJ SOLN
INTRAMUSCULAR | Status: AC
  Filled 2017-10-14: qty 2

## 2017-10-14 MED ORDER — KCL-LACTATED RINGERS-D5W 20 MEQ/L IV SOLN
INTRAVENOUS | Status: DC
  Administered 2017-10-14 – 2017-10-17 (×2): via INTRAVENOUS
  Filled 2017-10-14 (×6): qty 1000

## 2017-10-14 MED ORDER — ALBUMIN HUMAN 5 % IV SOLN
INTRAVENOUS | Status: DC | PRN
  Administered 2017-10-14: 05:00:00 via INTRAVENOUS

## 2017-10-14 MED ORDER — FUROSEMIDE 10 MG/ML IJ SOLN
20.0000 mg | Freq: Once | INTRAMUSCULAR | Status: DC
  Administered 2017-10-14: 20 mg via INTRAVENOUS

## 2017-10-14 MED ORDER — DIPHENHYDRAMINE HCL 50 MG/ML IJ SOLN
INTRAMUSCULAR | Status: DC | PRN
  Administered 2017-10-14: 25 mg via INTRAVENOUS

## 2017-10-14 MED ORDER — MORPHINE SULFATE (PF) 4 MG/ML IV SOLN: 2.0000 mg | INTRAVENOUS | Status: DC | PRN

## 2017-10-14 SURGICAL SUPPLY — 34 items
APPLICATOR COTTON TIP 6IN STRL (MISCELLANEOUS) IMPLANT
BLADE EXTENDED COATED 6.5IN (ELECTRODE) ×2 IMPLANT
BLADE HEX COATED 2.75 (ELECTRODE) ×2 IMPLANT
COVER MAYO STAND STRL (DRAPES) ×2 IMPLANT
COVER SURGICAL LIGHT HANDLE (MISCELLANEOUS) ×2 IMPLANT
DRAPE LAPAROSCOPIC ABDOMINAL (DRAPES) ×2 IMPLANT
DRAPE WARM FLUID 44X44 (DRAPE) ×2 IMPLANT
DRSG OPSITE POSTOP 4X10 (GAUZE/BANDAGES/DRESSINGS) ×2 IMPLANT
ELECT REM PT RETURN 15FT ADLT (MISCELLANEOUS) ×2 IMPLANT
GAUZE SPONGE 4X4 12PLY STRL (GAUZE/BANDAGES/DRESSINGS) IMPLANT
GLOVE BIOGEL PI IND STRL 7.0 (GLOVE) IMPLANT
GLOVE BIOGEL PI INDICATOR 7.0 (GLOVE)
GLOVE ECLIPSE 7.5 STRL STRAW (GLOVE) ×2 IMPLANT
GOWN STRL REUS W/TWL LRG LVL3 (GOWN DISPOSABLE) ×2 IMPLANT
GOWN STRL REUS W/TWL XL LVL3 (GOWN DISPOSABLE) ×2 IMPLANT
HANDLE SUCTION POOLE (INSTRUMENTS) ×1 IMPLANT
KIT BASIN OR (CUSTOM PROCEDURE TRAY) ×2 IMPLANT
NS IRRIG 1000ML POUR BTL (IV SOLUTION) ×6 IMPLANT
PACK GENERAL/GYN (CUSTOM PROCEDURE TRAY) ×2 IMPLANT
SPONGE LAP 18X18 X RAY DECT (DISPOSABLE) IMPLANT
STAPLER VISISTAT 35W (STAPLE) ×2 IMPLANT
SUCTION POOLE HANDLE (INSTRUMENTS) ×2
SUT MNCRL AB 4-0 PS2 18 (SUTURE) ×2 IMPLANT
SUT PDS AB 1 CTX 36 (SUTURE) ×4 IMPLANT
SUT PDS AB 1 TP1 96 (SUTURE) ×4 IMPLANT
SUT SILK 2 0 (SUTURE) ×1
SUT SILK 2 0 SH CR/8 (SUTURE) ×2 IMPLANT
SUT SILK 2-0 18XBRD TIE 12 (SUTURE) ×1 IMPLANT
SUT SILK 3 0 (SUTURE)
SUT SILK 3 0 SH CR/8 (SUTURE) ×2 IMPLANT
SUT SILK 3-0 18XBRD TIE 12 (SUTURE) IMPLANT
TOWEL OR 17X26 10 PK STRL BLUE (TOWEL DISPOSABLE) ×2 IMPLANT
TRAY FOLEY W/METER SILVER 16FR (SET/KITS/TRAYS/PACK) ×2 IMPLANT
YANKAUER SUCT BULB TIP NO VENT (SUCTIONS) ×2 IMPLANT

## 2017-10-14 NOTE — Anesthesia Postprocedure Evaluation (Signed)
Anesthesia Post Note  Patient: [name] [name]  Procedure(s) Performed: EXPLORATORY LAPAROTOMY for SMALL BOWEL OBSTRUCTION, cecopexy (N/A Abdomen)     Patient location during evaluation: PACU Anesthesia Type: General Level of consciousness: awake and alert, patient cooperative and oriented Pain management: pain level controlled Vital Signs Assessment: post-procedure vital signs reviewed and stable Respiratory status: spontaneous breathing, nonlabored ventilation, respiratory function stable and patient connected to face mask oxygen (rales improving) Cardiovascular status: blood pressure returned to baseline and stable Postop Assessment: no apparent nausea or vomiting Anesthetic complications: no Comments: Pt with O2 sats in 80's, rales bilaterally. He has improved with albuterol neb, lasix diuresis, face mask O2. Discussed with Dr. [name], who agrees with post op step-down unit for recovery.    Last Vitals:  Vitals:   10/14/17 0830 10/14/17 0840  BP: (!) 155/75 (!) 153/71  Pulse: 72 70  Resp: 16 16  Temp:  36.4 C  SpO2: 90% 90%    Last Pain:  Vitals:   10/14/17 0319  TempSrc: Oral  PainSc: 0-No pain                 [name] [name],E. Sharrieff Spratlin

## 2017-10-14 NOTE — Anesthesia Preprocedure Evaluation (Addendum)
Anesthesia Evaluation  Patient identified by MRN, date of birth, ID band Patient awake    Reviewed: Allergy & Precautions, NPO status , Patient's Chart, lab work & pertinent test results, reviewed documented beta blocker date and time   History of Anesthesia Complications Negative for: history of anesthetic complications  Airway Mallampati: II  TM Distance: >3 FB Neck ROM: Full    Dental  (+) Dental Advisory Given   Pulmonary former smoker (quit 2000),    breath sounds clear to auscultation       Cardiovascular hypertension, Pt. on medications and Pt. on home beta blockers (-) angina+ CAD, + Cardiac Stents and + Peripheral Vascular Disease  + Valvular Problems/Murmurs (mild-mod AI) AI  Rhythm:Regular Rate:Normal  6/18 ECHO: EF 30% to 35%. There is akinesis of the basal and mid anteroseptal, anterior, anterolateral and apical septal anterior and lateral walls, mild-mod AI   Neuro/Psych negative neurological ROS     GI/Hepatic Neg liver ROS, hiatal hernia, HPI: abd pain with SBO   Endo/Other  negative endocrine ROS  Renal/GU Renal InsufficiencyRenal disease (creat 1.48)     Musculoskeletal   Abdominal   Peds  Hematology CML   Anesthesia Other Findings   Reproductive/Obstetrics                            Anesthesia Physical Anesthesia Plan  ASA: III and emergent  Anesthesia Plan: General   Post-op Pain Management:    Induction: Intravenous and Rapid sequence  PONV Risk Score and Plan: 3 and Treatment may vary due to age or medical condition, Ondansetron and Dexamethasone  Airway Management Planned: Oral ETT  Additional Equipment:   Intra-op Plan:   Post-operative Plan: Possible Post-op intubation/ventilation  Informed Consent: I have reviewed the patients History and Physical, chart, labs and discussed the procedure including the risks, benefits and alternatives for the proposed  anesthesia with the patient or authorized representative who has indicated his/her understanding and acceptance.   Dental advisory given  Plan Discussed with: CRNA and Surgeon  Anesthesia Plan Comments: (Plan routine monitors, GETA)       Anesthesia Quick Evaluation

## 2017-10-14 NOTE — ED Notes (Signed)
Patient went to OR.

## 2017-10-14 NOTE — Transfer of Care (Signed)
Immediate Anesthesia Transfer of Care Note  Patient: [name] [name]  Procedure(s) Performed: EXPLORATORY LAPAROTOMY for SMALL BOWEL OBSTRUCTION, cecopexy (N/A Abdomen)  Patient Location: PACU  Anesthesia Type:General  Level of Consciousness: awake, alert , oriented and patient cooperative  Airway & Oxygen Therapy: Patient Spontanous Breathing and Patient connected to face mask oxygen  Post-op Assessment: Report given to RN, Post -op Vital signs reviewed and stable and Patient moving all extremities X 4  Post vital signs: stable  Last Vitals:  Vitals:   10/14/17 0642 10/14/17 0700  BP: (!) 162/77 (!) 176/85  Pulse: 62 76  Resp: 14 14  Temp: 36.6 C   SpO2: 90% 90%    Last Pain:  Vitals:   10/14/17 0319  TempSrc: Oral  PainSc: 0-No pain         Complications: No apparent anesthesia complications

## 2017-10-14 NOTE — Consult Note (Signed)
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  This morning when extubated he was noted to have an increasing oxygen requirement, initially 2 L and then increased eventually to a facemask 50% to maintain SPO2 greater than 90%.  When I saw him he was not complaining of any overt shortness of breath, denied any preceding fevers, chills, cough, chest pain, flulike symptoms or any other respiratory complaints prior to his arrival yesterday.  He notes that he does not take a diuretic at home, was previously on Lasix that was stopped and is not sure why.  He does not use any bronchodilators at home.  PAST MEDICAL HISTORY :   has a past medical history of Carotid artery occlusion, Chronic systolic CHF (congestive heart failure) ([place]), CKD (chronic kidney disease), stage III ([place]), CML (chronic myelocytic leukemia) ([place]), Coronary artery disease, Diverticulitis, History of hiatal hernia, History of kidney stones, History of small bowel obstruction, Hyperlipidemia, Ischemic cardiomyopathy, and Small bowel perforation ([place]).  has a past surgical history that includes Laparoscopic cholecystectomy; Inguinal hernia repair (Bilateral); Cardiac catheterization (10/09/2013); Coronary angioplasty (10/09/2013); Colon surgery (11/2012); left heart  catheterization with coronary angiogram (N/A, 10/09/2013); percutaneous coronary stent intervention (pci-s) (10/09/2013); Eye surgery (Bilateral); Endarterectomy (Right, 09/17/2015); and Carotid endarterectomy. Prior to Admission medications   Medication Sig Start Date End Date Taking? Authorizing Provider  Ascorbic Acid (VITAMIN C PO) Take 1,000 mg by mouth daily.    Yes [provider]  aspirin EC 81 MG tablet Take 1 tablet (81 mg total) by mouth daily. 10/02/13  Yes Larey Dresser, MD  BOSULIF 100 MG tablet Take 400 mg by mouth daily with breakfast.  08/30/17  Yes [provider]  calcium carbonate (CALCIUM 600) 600 MG TABS tablet Take 600 mg by mouth daily.   Yes [provider]  carvedilol (COREG) 12.5 MG tablet TAKE 1/2 TABLET TWICE DAILY WITH A MEAL 04/23/17  Yes Larey Dresser, MD  [facility] VITAMIN A PO Take 2,400 mcg by mouth daily.   Yes [provider]  cyanocobalamin 1000 MCG tablet Take 1,000 mcg by mouth daily.   Yes [provider]  losartan (COZAAR) 25 MG tablet TAKE 1/2 TABLET EVERY DAY 09/04/17  Yes Larey Dresser, MD  NIACIN PO Take 1 tablet by mouth daily.   Yes [provider]  pyridoxine (B-6) 100 MG tablet Take 100 mg by mouth daily.   Yes [provider]  Thiamine Mononitrate (VITAMIN B1 PO) Take 250 mg by mouth daily.   Yes [provider]  Respiratory Therapy Supplies (FLUTTER) DEVI Use as directed Patient not taking: Reported on 09/10/2017 08/17/16   Tanda Rockers, MD  rosuvastatin (CRESTOR) 5 MG tablet Take 1 tablet (5 mg total) by mouth every other day. Patient not taking: Reported on 10/14/2017 06/15/17   Larey Dresser, MD   Allergies  Allergen Reactions  . Percocet [  Oxycodone-Acetaminophen] Itching  . Metronidazole Other (See Comments)    Insomnia and nervousness    FAMILY HISTORY:  family history includes Diabetes in his mother; Emphysema in his father; Head & neck cancer in his  brother; Heart disease in his father; Kidney Stones in his father; Lymphoma in his brother; Stroke in his mother. SOCIAL HISTORY:  reports that he quit smoking about 18 years ago. His smoking use included cigarettes. He has a 45.00 pack-year smoking history. he has never used smokeless tobacco. He reports that he does not drink alcohol or use drugs.  SUBJECTIVE:   REVIEW OF SYSTEMS:   Full Review of Systems negative except otherwise noted as above  VITAL SIGNS: Temp:  [97.3 F (36.3 C)-97.9 F (36.6 C)] 97.3 F (36.3 C) (11/25 0910) Pulse Rate:  [55-77] 70 (11/25 0840) Resp:  [11-18] 16 (11/25 0840) BP: (146-186)/(63-98) 154/63 (11/25 0910) SpO2:  [88 %-99 %] 91 % (11/25 0910) FiO2 (%):  [50 %] 50 % (11/25 0910)  PHYSICAL EXAMINATION: Physical Exam  Constitutional: He is oriented to person, place, and time.  Groggy though awake, alert, conversant, in mild pain about his abdomen with turning though otherwise no acute distress while resting  HENT:  Head: Normocephalic and atraumatic.  Neck: Normal range of motion. Neck supple. JVD present.  Cardiovascular: Normal rate and regular rhythm.  No murmur heard. Pulmonary/Chest: Effort normal.  Crackles present in the bases and up to the mid chest, otherwise clear with normal work of breathing.  On facemask 50% with SPO2 at 90% with good waveform.  Abdominal: Soft.  Vertical midline surgical scar with staples intact.  No expressible pus, surrounding erythema or any other drainage.  Musculoskeletal: He exhibits no edema or tenderness.  Neurological: He is alert and oriented to person, place, and time. No cranial nerve deficit.  Skin: Skin is warm and dry.     Recent Labs  Lab 10/13/17 2143  NA 136  K 4.0  CL 109  CO2 20*  BUN 25*  CREATININE 1.48*  GLUCOSE 113*   Recent Labs  Lab 10/13/17 2143  HGB 12.5*  HCT 38.7*  WBC 10.0  PLT 167   Ct Abdomen Pelvis W Contrast  Result Date: 10/14/2017 CLINICAL DATA:  Mid to  upper abdominal pain for few days. History of small bowel perforation and renal insufficiency. EXAM: CT ABDOMEN AND PELVIS WITH CONTRAST TECHNIQUE: Multidetector CT imaging of the abdomen and pelvis was performed using the standard protocol following bolus administration of intravenous contrast. CONTRAST:  28mL ISOVUE-300 IOPAMIDOL (ISOVUE-300) INJECTION 61% COMPARISON:  None. FINDINGS: Lower chest: Normal size heart without pericardial effusion. Subpleural fibrosis noted at each lung base. Mild centrilobular emphysema and paraseptal emphysema. Hepatobiliary: Status post cholecystectomy which may account for the chronic intra and extrahepatic ductal dilatation. No choledocholithiasis. No space-occupying mass the liver. There is colonic interposition noted. Pancreas: Normal pancreas without mass. The main pancreatic duct is visualized but is normal in caliber. Spleen: No splenomegaly or mass. Adrenals/Urinary Tract: Normal bilateral adrenal glands. Mild atrophy of left kidney. No enhancing renal masses. Nonobstructing left-sided renal calculi in the upper and lower pole, the largest measuring approximately 6 x 3 mm in the lower pole. Chronic ectasia of the intrarenal collecting system on the left with bilateral extrarenal pelves. Distal left ureteral stone is stable measuring 4 mm adjacent to the medial wall of the left acetabulum. No hydroureteronephrosis is apparent. The urinary bladder is physiologically distended. Stomach/Bowel: Small hiatal hernia with decompressed stomach. New mild-to-moderately distended  small bowel loops are identified with twisting of the small bowel mesentery concerning for midgut volvulus contributing to changes of early small-bowel obstruction. The patient is status post small bowel resection with enteric sutures now noted in the right hemiabdomen, previously seen in the left hemiabdomen and may be a reflection of the mesenteric twist. Desiccated stool is noted within large bowel. The  appendix is not discretely identified. Moderate sigmoid diverticulosis. Vascular/Lymphatic: There are mildly enlarged subcentimeter short axis mesenteric lymph nodes surrounded by hazy mesenteric edema. A lymphoproliferative disorder is not excluded versus reactive mesenteric adenopathy. These lymph nodes are approximately 1 cm short axis but more prominent than on prior. Reproductive: Enlarged prostate is stable measuring 5.8 x 3.7 cm in transverse by AP dimension. Other: Small amount of free fluid is noted in the pelvis. Hazy mesenteric edema is noted. Musculoskeletal: Lumbar spondylitic degenerative disc disease L3-4 and to a greater extent L4-5. No acute osseous abnormality. IMPRESSION: 1. 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 No bowel perforation. Mesenteric edema is noted at the root of the mesentery with mildly enlarged mesenteric lymph nodes possibly representing a lymphoproliferative state. New small volume of ascites is seen in the lower pelvis. These results were called by telephone at the time of interpretation on 10/14/2017 at 1:15 am to Dr. Veatrice Kells , who verbally acknowledged these results. 2. Chronic mild atrophy of the left kidney with chronic hydroureteronephrosis. Left-sided nephrolithiasis and distal left pelvic ureteral stones are stable. 3. Status post cholecystectomy with chronic intra and extrahepatic ductal dilatation possibly representing post cholecystectomy change and reservoir effect. 4. Lumbar spondylosis. 5. Paraseptal and centrilobular emphysema at the lung bases. Electronically Signed   By: Ashley Royalty M.D.   On: 10/14/2017 01:15   Dg Chest Port 1 View  Result Date: 10/14/2017 CLINICAL DATA:  Shortness of breath. EXAM: PORTABLE CHEST 1 VIEW COMPARISON:  Radiographs of August 06, 2017. FINDINGS: Stable cardiomediastinal silhouette. Atherosclerosis of thoracic aorta is noted. Interval placement of nasogastric tube with  distal tip in expected position of gastroesophageal junction. Increased bilateral diffuse interstitial densities are noted throughout both lungs concerning for worsening pulmonary edema or atypical inflammation. No pneumothorax is noted. Bony thorax is unremarkable. IMPRESSION: Aortic atherosclerosis. Interval placement of a nasogastric tube with distal tip in expected position of gastroesophageal junction. Increased bilateral diffuse interstitial densities are noted concerning for pulmonary edema or atypical inflammation. Electronically Signed   By: Marijo Conception, M.D.   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  Appears to have received a total of 2 L of IVF intraoperatively.  This morning upon extubation was noted to be in hypoxemic respiratory failure, most likely due to fluid resuscitation in the setting of systolic heart failure with concurrent acute kidney injury.  #Acute hypoxemic respiratory failure due to volume overload -Based on his renal function he may require higher doses of Lasix, however by the time I saw him he had already made almost 900 mL of urine shortly after receiving Lasix 20 mg so we will monitor his urine output based on this and consider a higher dose or if he is not on target to reach that goal of  net -1-1-1/2 L diuresis by tomorrow -Scheduled duo nebs ordered.  His pulmonary function test from last year show a very mild obstructive defect with emphysematous changes on his CT scan, however this is likely not a significant contributor and I would not recommend a course of prednisone at this time especially given the side effect of poor wound healing though this  can be considered if he remains hypoxemic despite adequate diuresis  # Acute on chronic kidney disease -Now resolved, suspect this was due to an element of ATN is presumed poor p.o. intake due to his volvulus -monitor renal function and renally dose all medications per pharmacy  Mali Jaritza Duignan, MD Pulmonary and [place] Pager: [phone]  10/14/2017, 10:38 AM

## 2017-10-14 NOTE — ED Notes (Signed)
Pt here from [facility] for surgical consult. Pt was given fentanyl at 0147. Pt's last food was at 1800

## 2017-10-14 NOTE — H&P (Signed)
[name] [name] is an 83 y.o. male.    Chief Complaint: Abdominal pain  HPI: Patient is an 83 year old male with a remote history of open hiatal hernia repair and cholecystectomy and history of partial colectomy in 2014.  History of previous small bowel obstruction managed nonoperatively in 2016.  He has not had recent GI symptoms until this week.  3 days ago he developed the onset of crampy and aching mid abdominal pain.  This lasted for several hours and then went away.  2 days ago the pain recurred more severe associated with nausea.  He stopped eating and took some Tylenol and after some hours it again improved.  He had a bowel movement at that time.  Then yesterday the pain recurred and was severe.  Radiating to his back.  Associated with nausea and small-volume emesis.  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  He was transferred to [facility].  At this point he is having moderate pain.  This is in his back and mid abdomen.  No fever or chills.  No urinary symptoms.  He feels his abdomen is slightly distended on the left side.  No chest pain or shortness of breath.  Past Medical History:  Diagnosis Date  . Carotid artery occlusion   . Chronic systolic CHF (congestive heart failure) ([place])    a. 07/2013 Echo: EF 35-40%  . CKD (chronic kidney disease), stage III ([place])    pt. states that he does not have CKD  . CML (chronic myelocytic leukemia) ([place])   . Coronary artery disease    a. 09/2013 Cath: LM min irregs, LAD 95p(2.75x20 Promus Premier DES & 3.0x20 Promus Premier DES overlapping), D1 50-60, LCX 30p, OM1 70m OM2 60-70p, RCA small, nl.  . Diverticulitis   . History of hiatal hernia   . History of kidney stones   . History of small bowel obstruction   . Hyperlipidemia   . Ischemic cardiomyopathy    a. 07/2013 Echo: EF 35-40%, apical septal, apical lat, apical AK, mod MR.  .  [place]    a. 11/2012 s/p emergent SB resection.    Past Surgical History:  Procedure Laterality Date  . CARDIAC CATHETERIZATION  10/09/2013  . CAROTID ENDARTERECTOMY    . COLON SURGERY  11/2012   BOWEL SURGERY   . CORONARY ANGIOPLASTY  10/09/2013   MID LAD  . ENDARTERECTOMY Right 09/17/2015   Procedure: Right Carotid ENDARTERECTOMY with Xenosure Patch Angioplasty;  Surgeon: VSerafina Mitchell MD;  [place] [facility]  Service: Vascular;  Laterality: Right;  . EYE SURGERY Bilateral   . INGUINAL HERNIA REPAIR Bilateral   . LAPAROSCOPIC CHOLECYSTECTOMY    . LEFT HEART CATHETERIZATION WITH CORONARY ANGIOGRAM N/A 10/09/2013   Procedure: LEFT HEART CATHETERIZATION WITH CORONARY ANGIOGRAM;  Surgeon: DLarey Dresser MD;  [facility];  Service: Cardiovascular;  Laterality: N/A;  . PERCUTANEOUS CORONARY STENT INTERVENTION (PCI-S)  10/09/2013   Procedure: PERCUTANEOUS CORONARY STENT INTERVENTION (PCI-S);  Surgeon: DLarey Dresser MD;  [facility];  Service: Cardiovascular;;    Family History  Problem Relation Age of Onset  . Emphysema Father        died @ 636 smoked  . Heart disease Father   . Kidney Stones Father   . Diabetes Mother        died @ 736 . Stroke Mother   .  Head & neck cancer Brother   . Lymphoma Brother   . Colon cancer Neg Hx   . Stomach cancer Neg Hx   . Rectal cancer Neg Hx   . Esophageal cancer Neg Hx   . Liver cancer Neg Hx    Social History:  reports that he quit smoking about 18 years ago. His smoking use included cigarettes. He has a 45.00 pack-year smoking history. he has never used smokeless tobacco. He reports that he does not drink alcohol or use drugs.  Allergies:  Allergies  Allergen Reactions  . Percocet [Oxycodone-Acetaminophen] Itching  . Metronidazole Other (See Comments)    Insomnia and nervousness   Current Facility-Administered Medications  Medication Dose Route Frequency Provider Last Rate Last Dose  .  cefOXitin (MEFOXIN) 2 g in dextrose 5 % 50 mL IVPB  2 g Intravenous Once [name] Seltzer, MD       Current Outpatient Medications  Medication Sig Dispense Refill  . Ascorbic Acid (VITAMIN C PO) Take 1,000 mg by mouth daily.     Marland Kitchen aspirin EC 81 MG tablet Take 1 tablet (81 mg total) by mouth daily. 90 tablet 3  . BOSULIF 100 MG tablet Take 400 mg by mouth daily with breakfast.     . calcium carbonate (CALCIUM 600) 600 MG TABS tablet Take 600 mg by mouth daily.    . carvedilol (COREG) 12.5 MG tablet TAKE 1/2 TABLET TWICE DAILY WITH A MEAL 30 tablet 6  . CVS VITAMIN A PO Take 2,400 mcg by mouth daily.    . cyanocobalamin 1000 MCG tablet Take 1,000 mcg by mouth daily.    Marland Kitchen losartan (COZAAR) 25 MG tablet TAKE 1/2 TABLET EVERY DAY 15 tablet 3  . NIACIN PO Take 1 tablet by mouth daily.    Marland Kitchen pyridoxine (B-6) 100 MG tablet Take 100 mg by mouth daily.    . Thiamine Mononitrate (VITAMIN B1 PO) Take 250 mg by mouth daily.    Marland Kitchen Respiratory Therapy Supplies (FLUTTER) DEVI Use as directed (Patient not taking: Reported on 09/10/2017) 1 each 0  . rosuvastatin (CRESTOR) 5 MG tablet Take 1 tablet (5 mg total) by mouth every other day. (Patient not taking: Reported on 10/14/2017) 45 tablet 3     Results for orders placed or performed during the hospital encounter of 10/13/17 (from the past 48 hour(s))  CBC     Status: Abnormal   Collection Time: 10/13/17  9:43 PM  Result Value Ref Range   WBC 10.0 4.0 - 10.5 K/uL   RBC 4.36 4.22 - 5.81 MIL/uL   Hemoglobin 12.5 (L) 13.0 - 17.0 g/dL   HCT 38.7 (L) 39.0 - 52.0 %   MCV 88.8 78.0 - 100.0 fL   MCH 28.7 26.0 - 34.0 pg   MCHC 32.3 30.0 - 36.0 g/dL   RDW 15.8 (H) 11.5 - 15.5 %   Platelets 167 150 - 400 K/uL  Troponin I     Status: Abnormal   Collection Time: 10/13/17  9:43 PM  Result Value Ref Range   Troponin I 0.03 (HH) <0.03 ng/mL    Comment: CRITICAL RESULT CALLED TO, READ BACK BY AND VERIFIED WITH: BENTON,J AT 2230 ON [id number] BY CHERESNOWSKY,T    Lipase, blood     Status: None   Collection Time: 10/13/17  9:43 PM  Result Value Ref Range   Lipase 35 11 - 51 U/L  Comprehensive metabolic panel     Status: Abnormal   Collection Time: 10/13/17  9:43  PM  Result Value Ref Range   Sodium 136 135 - 145 mmol/L   Potassium 4.0 3.5 - 5.1 mmol/L   Chloride 109 101 - 111 mmol/L   CO2 20 (L) 22 - 32 mmol/L   Glucose, Bld 113 (H) 65 - 99 mg/dL   BUN 25 (H) 6 - 20 mg/dL   Creatinine, Ser 1.48 (H) 0.61 - 1.24 mg/dL   Calcium 8.5 (L) 8.9 - 10.3 mg/dL   Total Protein 7.1 6.5 - 8.1 g/dL   Albumin 3.9 3.5 - 5.0 g/dL   AST 45 (H) 15 - 41 U/L   ALT 47 17 - 63 U/L   Alkaline Phosphatase 116 38 - 126 U/L   Total Bilirubin 0.4 0.3 - 1.2 mg/dL   GFR calc non Af Amer 42 (L) >60 mL/min   GFR calc Af Amer 48 (L) >60 mL/min    Comment: (NOTE) The eGFR has been calculated using the CKD EPI equation. This calculation has not been validated in all clinical situations. eGFR's persistently <60 mL/min signify possible Chronic Kidney Disease.    Anion gap 7 5 - 15  Urinalysis, Routine w reflex microscopic     Status: Abnormal   Collection Time: 10/13/17  9:50 PM  Result Value Ref Range   Color, Urine YELLOW YELLOW   APPearance CLEAR CLEAR   Specific Gravity, Urine >1.030 (H) 1.005 - 1.030   pH 6.0 5.0 - 8.0   Glucose, UA NEGATIVE NEGATIVE mg/dL   Hgb urine dipstick NEGATIVE NEGATIVE   Bilirubin Urine NEGATIVE NEGATIVE   Ketones, ur NEGATIVE NEGATIVE mg/dL   Protein, ur 100 (A) NEGATIVE mg/dL   Nitrite NEGATIVE NEGATIVE   Leukocytes, UA NEGATIVE NEGATIVE  Urinalysis, Microscopic (reflex)     Status: Abnormal   Collection Time: 10/13/17  9:50 PM  Result Value Ref Range   RBC / HPF 0-5 0 - 5 RBC/hpf   WBC, UA 0-5 0 - 5 WBC/hpf   Bacteria, UA NONE SEEN NONE SEEN   Squamous Epithelial / LPF 0-5 (A) NONE SEEN   Mucus PRESENT    Hyaline Casts, UA PRESENT   Troponin I     Status: Abnormal   Collection Time: 10/14/17 12:26 AM  Result Value Ref Range    Troponin I 0.03 (HH) <0.03 ng/mL    Comment: CRITICAL VALUE NOTED.  VALUE IS CONSISTENT WITH PREVIOUSLY REPORTED AND CALLED VALUE.  I-Stat CG4 Lactic Acid, ED     Status: None   Collection Time: 10/14/17  1:44 AM  Result Value Ref Range   Lactic Acid, Venous 0.60 0.5 - 1.9 mmol/L   Ct Abdomen Pelvis W Contrast  Result Date: 10/14/2017 CLINICAL DATA:  Mid to upper abdominal pain for few days. History of small bowel perforation and renal insufficiency. EXAM: CT ABDOMEN AND PELVIS WITH CONTRAST TECHNIQUE: Multidetector CT imaging of the abdomen and pelvis was performed using the standard protocol following bolus administration of intravenous contrast. CONTRAST:  69m ISOVUE-300 IOPAMIDOL (ISOVUE-300) INJECTION 61% COMPARISON:  None. FINDINGS: Lower chest: Normal size heart without pericardial effusion. Subpleural fibrosis noted at each lung base. Mild centrilobular emphysema and paraseptal emphysema. Hepatobiliary: Status post cholecystectomy which may account for the chronic intra and extrahepatic ductal dilatation. No choledocholithiasis. No space-occupying mass the liver. There is colonic interposition noted. Pancreas: Normal pancreas without mass. The main pancreatic duct is visualized but is normal in caliber. Spleen: No splenomegaly or mass. Adrenals/Urinary Tract: Normal bilateral adrenal glands. Mild atrophy of left kidney. No enhancing renal masses.  Nonobstructing left-sided renal calculi in the upper and lower pole, the largest measuring approximately 6 x 3 mm in the lower pole. Chronic ectasia of the intrarenal collecting system on the left with bilateral extrarenal pelves. Distal left ureteral stone is stable measuring 4 mm adjacent to the medial wall of the left acetabulum. No hydroureteronephrosis is apparent. The urinary bladder is physiologically distended. Stomach/Bowel: Small hiatal hernia with decompressed stomach. 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 The patient is status post small bowel resection with enteric sutures now noted in the right hemiabdomen, previously seen in the left hemiabdomen and may be a reflection of the mesenteric twist. Desiccated stool is noted within large bowel. The appendix is not discretely identified. Moderate sigmoid diverticulosis. Vascular/Lymphatic: There are mildly enlarged subcentimeter short axis mesenteric lymph nodes surrounded by hazy mesenteric edema. A lymphoproliferative disorder is not excluded versus reactive mesenteric adenopathy. These lymph nodes are approximately 1 cm short axis but more prominent than on prior. Reproductive: Enlarged prostate is stable measuring 5.8 x 3.7 cm in transverse by AP dimension. Other: Small amount of free fluid is noted in the pelvis. Hazy mesenteric edema is noted. Musculoskeletal: Lumbar spondylitic degenerative disc disease L3-4 and to a greater extent L4-5. No acute osseous abnormality. IMPRESSION: 1. 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 No bowel perforation. Mesenteric edema is noted at the root of the mesentery with mildly enlarged mesenteric lymph nodes possibly representing a lymphoproliferative state. New small volume of ascites is seen in the lower pelvis. These results were called by telephone at the time of interpretation on 10/14/2017 at 1:15 am to Dr. Veatrice Kells , who verbally acknowledged these results. 2. Chronic mild atrophy of the left kidney with chronic hydroureteronephrosis. Left-sided nephrolithiasis and distal left pelvic ureteral stones are stable. 3. Status post cholecystectomy with chronic intra and extrahepatic ductal dilatation possibly representing post cholecystectomy change and reservoir effect. 4. Lumbar spondylosis. 5. Paraseptal and centrilobular emphysema at the lung  bases. Electronically Signed   By: Ashley Royalty M.D.   On: 10/14/2017 01:15    Review of Systems  Constitutional: Positive for weight loss. Negative for chills and fever.  HENT: Positive for hearing loss.   Respiratory: Negative.   Cardiovascular: Negative.   Gastrointestinal: Positive for abdominal pain, nausea and vomiting. Negative for blood in stool, constipation and diarrhea.  Genitourinary: Negative.     Blood pressure (!) 157/83, pulse (!) 55, temperature 97.7 F (36.5 C), temperature source Oral, resp. rate 16, SpO2 97 %. Physical Exam  General: Alert, thin Caucasian male, in no  distress Skin: Warm and dry without rash or infection.  Patchy crusty lesions over scalp with solar damage HEENT: Hard of hearing.  No palpable masses or thyromegaly. Sclera nonicteric. Pupils equal round and reactive.  Lymph nodes: No cervical, supraclavicular, or inguinal nodes palpable. Lungs: Breath sounds clear and equal without increased work of breathing Cardiovascular: Regular rate and rhythm without murmur. No JVD or edema.  Abdomen: Long well-healed midline incision.  Some high-pitched bowel sounds.  Mild distention but soft.  Minimal tenderness to deep palpation without guarding. No masses palpable. No organomegaly. No palpable hernias. Extremities: No edema or joint swelling or deformity. No chronic venous stasis changes. Neurologic: Alert and fully oriented.  Affect normal.  No motor deficits.  Assessment/Plan 83 year old male with previous history of open  hiatal hernia repair and open colectomy.  History of small bowel obstruction treated nonoperatively.  Now presents with acute abdominal pain and CT scan showing evidence of small bowel obstruction.  I have personally reviewed his imaging.  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  He currently does not appear severely ill and has a relatively benign abdomen but I am very concerned that  he is at risk for intestinal ischemia secondary to volvulus.  I discussed these findings with the patient and his family.  I recommend proceeding with emergency exploratory laparotomy.  I discussed the alternative of observation but my concern for this because of the above findings.  We discussed the nature of surgery, expected recovery, risks of anesthetic complications, cardiorespiratory complications, bleeding, infection.  He desires to proceed with surgery.  [name] Jolly, MD 10/14/2017, 4:14 AM

## 2017-10-14 NOTE — Op Note (Signed)
Preoperative Diagnosis: Midgut volvulus  Postoprative Diagnosis: Same  Procedure: Procedure(s): EXPLORATORY LAPAROTOMY for SMALL BOWEL OBSTRUCTION, CECOPEXY   Surgeon: Excell Seltzer T   Assistants: None  Anesthesia:  General endotracheal anesthesia  Indications: Patient is an 83 year old male with previous history of small bowel resection and remote history of hiatal hernia repair with cholecystectomy both open.  He presents with several days of intermittent worsening abdominal pain and then severe mid abdominal pain with nausea today.  CT scan has shown evidence of partial bowel obstruction but more significantly twisting of the mesentery and displacement of the bowel consistent with midgut volvulus.  After workup and discussion with the patient and his family detailed elsewhere we have elected to proceed with exploratory laparotomy.    Procedure Detail: Patient was taken to the operating room, placed in the supine position on the operating table, and general endotracheal anesthesia induced.  Foley catheter was placed.  Nasogastric tube was placed.  The abdomen was widely sterilely prepped and draped.  He received preoperative IV antibiotics.  PAS were in place.  Patient timeout was performed and correct procedure verified.  I used the central portion of the previous long midline incision skirting the umbilicus and dissection was carried down through the subcutaneous tissue and midline fascia using cautery.  The peritoneum was carefully entered under direct vision.  There were no adhesions to the anterior abdominal wall and the incision was widely opened.  There was noted to be a moderate amount of chylous ascites.  This was suctioned.  There were remarkably no adhesions of small bowel loops in the small bowel could be easily eviscerated for examination.  It was moderately dilated but not ischemic.  The mesentery was elongated and there was an obvious twist of the SMA pedicle.  As I ran the  small bowel it was noted that the cecum and terminal ileum were displaced underneath the remainder of the small bowel and located in the left upper quadrant lateral to the ligament of Treitz.  The very distal terminal ileum was completely decompressed with the remainder of the small bowel moderately dilated.  The mesentery was untwisted bringing the cecum and ileum back down into the right lower quadrant.  I could then run the small bowel in a normal fashion from the right lower quadrant up to the ligament of Treitz.  There was a widely patent small bowel anastomosis in the jejunum with several adjacent small to moderate-sized jejunal diverticulum uninflamed.  The ligament of Treitz in the normal position was just proximal to this.  At this point the twist in the mesentery had been corrected.  The base of the mesentery was edematous with some hemorrhage.  The bowel was then carefully run from proximal to distal and the mesentery was all lying in normal orientation.  In order to prevent recurrence I elected to do a cecopexy.  The cecum was sutured to the peritoneum in the right lower quadrant sidewall the peritoneum with several 2-0 silk sutures using seromuscular bites on the cecum to deep bites and the peritoneum positioning the terminal ileum well into the right pelvis.  The abdomen was thoroughly irrigated.  There was no bleeding.  No evidence of injury.  The fascia was closed with running looped #1 PDS begun at either end of the incision and tied centrally.  Some cutaneous tissue was irrigated and skin closed with staples.  Sponge needle and instrument counts were correct.    Findings: As above  Estimated Blood Loss:  Minimal  Drains: None  Blood Given: none          Specimens: None        Complications:  * No complications entered in OR log *         Disposition: PACU - hemodynamically stable.         Condition: stable

## 2017-10-14 NOTE — ED Provider Notes (Signed)
[facility] EMERGENCY DEPARTMENT Provider Note   CSN: [id number] Arrival date & time: 10/13/17  2130     History   Chief Complaint Chief Complaint  Patient presents with  . Abdominal Pain    HPI [name] [name] is a 83 y.o. male.  The history is provided by the patient and a relative.  Abdominal Pain   This is a new problem. The current episode started 2 days ago. The problem occurs constantly. The problem has not changed since onset.The pain is associated with an unknown factor. The pain is located in the generalized abdominal region. The pain is severe. Associated symptoms include anorexia, nausea and vomiting. Pertinent negatives include fever, constipation and dysuria. Nothing aggravates the symptoms. Nothing relieves the symptoms. Past workup includes CT scan. His past medical history does not include Crohn's disease.  Started Wednesday night and has been progressive.  Reports he has been gassy.  No f/c/r.    Past Medical History:  Diagnosis Date  . Carotid artery occlusion   . Chronic systolic CHF (congestive heart failure) ([place])    a. 07/2013 Echo: EF 35-40%  . CKD (chronic kidney disease), stage III ([place])    pt. states that he does not have CKD  . CML (chronic myelocytic leukemia) ([place])   . Coronary artery disease    a. 09/2013 Cath: LM min irregs, LAD 95p(2.75x20 Promus Premier DES & 3.0x20 Promus Premier DES overlapping), D1 50-60, LCX 30p, OM1 43m, OM2 60-70p, RCA small, nl.  . Diverticulitis   . History of hiatal hernia   . History of kidney stones   . History of small bowel obstruction   . Hyperlipidemia   . Ischemic cardiomyopathy    a. 07/2013 Echo: EF 35-40%, apical septal, apical lat, apical AK, mod MR.  . Small bowel perforation ([place])    a. 11/2012 s/p emergent SB resection.    Patient Active Problem List   Diagnosis Date Noted  . Cough 08/17/2016  . Carotid stenosis 09/17/2015  . Right carotid bruit 12/14/2014  . Unstable angina ([place])  10/10/2013  . Ischemic cardiomyopathy   . Hyperlipidemia   . Chronic systolic CHF (congestive heart failure) ([place]) 10/03/2013  . CAD (coronary artery disease) 10/03/2013  . Acute systolic heart failure ([place]) 08/11/2013  . Chronic kidney disease (CKD), stage III (moderate) ([facility]) 08/11/2013  . Acute respiratory failure with hypoxia ([place]) 08/09/2013  . Acute pulmonary edema ([place]) 08/09/2013  . Small bowel obstruction ([place]) 08/09/2013    Past Surgical History:  Procedure Laterality Date  . CARDIAC CATHETERIZATION  10/09/2013  . CAROTID ENDARTERECTOMY    . COLON SURGERY  11/2012   BOWEL SURGERY   . CORONARY ANGIOPLASTY  10/09/2013   MID LAD  . ENDARTERECTOMY Right 09/17/2015   Procedure: Right Carotid ENDARTERECTOMY with Xenosure Patch Angioplasty;  Surgeon: Serafina Mitchell, MD;  Location: [facility];  Service: Vascular;  Laterality: Right;  . EYE SURGERY Bilateral   . INGUINAL HERNIA REPAIR Bilateral   . LAPAROSCOPIC CHOLECYSTECTOMY    . LEFT HEART CATHETERIZATION WITH CORONARY ANGIOGRAM N/A 10/09/2013   Procedure: LEFT HEART CATHETERIZATION WITH CORONARY ANGIOGRAM;  Surgeon: Larey Dresser, MD;  Location: [facility] CATH LAB;  Service: Cardiovascular;  Laterality: N/A;  . PERCUTANEOUS CORONARY STENT INTERVENTION (PCI-S)  10/09/2013   Procedure: PERCUTANEOUS CORONARY STENT INTERVENTION (PCI-S);  Surgeon: Larey Dresser, MD;  Location: [facility] CATH LAB;  Service: Cardiovascular;;       Home Medications    Prior to Admission medications  Medication Sig Start Date End Date Taking? Authorizing Provider  Ascorbic Acid (VITAMIN C PO) Take 500 mg by mouth daily.     [provider]  aspirin EC 81 MG tablet Take 1 tablet (81 mg total) by mouth daily. 10/02/13   Larey Dresser, MD  BOSULIF 100 MG tablet  08/30/17   [provider]  Calcium Carb-Cholecalciferol (CALCIUM + D3) 600-200 MG-UNIT TABS Take by mouth daily.    [provider]  calcium carbonate (CALCIUM 600) 600 MG  TABS tablet Take 600 mg by mouth daily.    [provider]  Calcium-Magnesium-Zinc [phone] MG TABS Take by mouth daily.    [provider]  carvedilol (COREG) 12.5 MG tablet TAKE 1/2 TABLET TWICE DAILY WITH A MEAL 04/23/17   Larey Dresser, MD  Coenzyme Q10 (CO Q10) 200 MG CAPS Take by mouth daily.    [provider]  [employer] VITAMIN A PO Take 2,400 mcg by mouth daily.    [provider]  cyanocobalamin 1000 MCG tablet Take 1,000 mcg by mouth daily.    [provider]  furosemide (LASIX) 20 MG tablet Take 20 mg by mouth daily as needed for fluid or edema.    [provider]  imatinib (GLEEVEC) 400 MG tablet Take 400 mg by mouth daily. Take with meals and large glass of water.Caution:Chemotherapy.    [provider]  losartan (COZAAR) 25 MG tablet TAKE 1/2 TABLET EVERY DAY 09/04/17   Larey Dresser, MD  NIACIN PO Take 1 tablet by mouth daily.    [provider]  potassium chloride (K-DUR,KLOR-CON) 10 MEQ tablet Take 2 tablets (20 mEq total) by mouth daily. 09/14/16   Bensimhon, Shaune Pascal, MD  Probiotic Product (ALIGN PO) Take by mouth daily.    [provider]  pyridoxine (B-6) 100 MG tablet Take 100 mg by mouth daily.    [provider]  Respiratory Therapy Supplies (FLUTTER) DEVI Use as directed Patient not taking: Reported on 09/10/2017 08/17/16   Tanda Rockers, MD  rosuvastatin (CRESTOR) 5 MG tablet Take 1 tablet (5 mg total) by mouth every other day. 06/15/17   Larey Dresser, MD  Thiamine Mononitrate (VITAMIN B1 PO) Take 250 mg by mouth daily.    [provider]    Family History Family History  Problem Relation Age of Onset  . Emphysema Father        died @ 68- smoked  . Heart disease Father   . Kidney Stones Father   . Diabetes Mother        died @ 71  . Stroke Mother   . Head & neck cancer Brother   . Lymphoma Brother   . Colon cancer Neg Hx   . Stomach cancer Neg Hx   . Rectal  cancer Neg Hx   . Esophageal cancer Neg Hx   . Liver cancer Neg Hx     Social History Social History   Tobacco Use  . Smoking status: Former Smoker    Packs/day: 1.00    Years: 45.00    Pack years: 45.00    Types: Cigarettes    Last attempt to quit: 06/09/1999    Years since quitting: 18.3  . Smokeless tobacco: Never Used  Substance Use Topics  . Alcohol use: No    Alcohol/week: 0.0 oz  . Drug use: No     Allergies   Percocet [oxycodone-acetaminophen] and Metronidazole   Review of Systems Review of Systems  Constitutional: Negative for diaphoresis and fever.  Respiratory: Negative for cough and shortness of breath.   Cardiovascular: Negative for chest pain, palpitations and leg swelling.  Gastrointestinal: Positive for abdominal pain, anorexia, nausea and vomiting. Negative for constipation.  Genitourinary: Negative for dysuria and flank pain.  All other systems reviewed and are negative.    Physical Exam Updated Vital Signs BP (!) 164/85 (BP Location: Left Arm)   Pulse (!) 59   Temp 97.9 F (36.6 C) (Oral)   Resp 14   SpO2 96%   Physical Exam  Constitutional: He is oriented to person, place, and time. He appears well-developed.  Non-toxic appearance.  HENT:  Head: Normocephalic and atraumatic.  Mouth/Throat: Oropharynx is clear and moist.  Eyes: Pupils are equal, round, and reactive to light.  Cardiovascular: Normal rate, regular rhythm, normal heart sounds and intact distal pulses.  Pulmonary/Chest: Effort normal and breath sounds normal.  Abdominal: He exhibits distension. He exhibits no pulsatile liver and no ascites. Bowel sounds are increased. There is no hepatosplenomegaly. There is generalized tenderness. There is no rigidity, no rebound, no guarding and negative Murphy's sign. No hernia.  Neurological: He is alert and oriented to person, place, and time.  Skin: Skin is warm and dry. Capillary refill takes less than 2 seconds.  Psychiatric: He has a  normal mood and affect. His behavior is normal.  Nursing note and vitals reviewed.    ED Treatments / Results  Labs (all labs ordered are listed, but only abnormal results are displayed)  Results for orders placed or performed during the hospital encounter of 10/13/17  CBC  Result Value Ref Range   WBC 10.0 4.0 - 10.5 K/uL   RBC 4.36 4.22 - 5.81 MIL/uL   Hemoglobin 12.5 (L) 13.0 - 17.0 g/dL   HCT 38.7 (L) 39.0 - 52.0 %   MCV 88.8 78.0 - 100.0 fL   MCH 28.7 26.0 - 34.0 pg   MCHC 32.3 30.0 - 36.0 g/dL   RDW 15.8 (H) 11.5 - 15.5 %   Platelets 167 150 - 400 K/uL  Troponin I  Result Value Ref Range   Troponin I 0.03 (HH) <0.03 ng/mL  Lipase, blood  Result Value Ref Range   Lipase 35 11 - 51 U/L  Comprehensive metabolic panel  Result Value Ref Range   Sodium 136 135 - 145 mmol/L   Potassium 4.0 3.5 - 5.1 mmol/L   Chloride 109 101 - 111 mmol/L   CO2 20 (L) 22 - 32 mmol/L   Glucose, Bld 113 (H) 65 - 99 mg/dL   BUN 25 (H) 6 - 20 mg/dL   Creatinine, Ser 1.48 (H) 0.61 - 1.24 mg/dL   Calcium 8.5 (L) 8.9 - 10.3 mg/dL   Total Protein 7.1 6.5 - 8.1 g/dL   Albumin 3.9 3.5 - 5.0 g/dL   AST 45 (H) 15 - 41 U/L   ALT 47 17 - 63 U/L   Alkaline Phosphatase 116 38 - 126 U/L   Total Bilirubin 0.4 0.3 - 1.2 mg/dL   GFR calc non Af Amer 42 (L) >60 mL/min   GFR calc Af Amer 48 (L) >60 mL/min   Anion gap 7 5 - 15  Urinalysis, Routine w reflex microscopic  Result Value Ref Range   Color, Urine YELLOW YELLOW   APPearance CLEAR CLEAR   Specific Gravity, Urine >1.030 (H) 1.005 - 1.030   pH 6.0 5.0 - 8.0   Glucose, UA NEGATIVE NEGATIVE mg/dL   Hgb urine dipstick  NEGATIVE NEGATIVE   Bilirubin Urine NEGATIVE NEGATIVE   Ketones, ur NEGATIVE NEGATIVE mg/dL   Protein, ur 100 (A) NEGATIVE mg/dL   Nitrite NEGATIVE NEGATIVE   Leukocytes, UA NEGATIVE NEGATIVE  Urinalysis, Microscopic (reflex)  Result Value Ref Range   RBC / HPF 0-5 0 - 5 RBC/hpf   WBC, UA 0-5 0 - 5 WBC/hpf   Bacteria, UA NONE  SEEN NONE SEEN   Squamous Epithelial / LPF 0-5 (A) NONE SEEN   Mucus PRESENT    Hyaline Casts, UA PRESENT   Troponin I  Result Value Ref Range   Troponin I 0.03 (HH) <0.03 ng/mL   Ct Abdomen Pelvis W Contrast  Result Date: 10/14/2017 CLINICAL DATA:  Mid to upper abdominal pain for few days. History of small bowel perforation and renal insufficiency. EXAM: CT ABDOMEN AND PELVIS WITH CONTRAST TECHNIQUE: Multidetector CT imaging of the abdomen and pelvis was performed using the standard protocol following bolus administration of intravenous contrast. CONTRAST:  49mL ISOVUE-300 IOPAMIDOL (ISOVUE-300) INJECTION 61% COMPARISON:  None. FINDINGS: Lower chest: Normal size heart without pericardial effusion. Subpleural fibrosis noted at each lung base. Mild centrilobular emphysema and paraseptal emphysema. Hepatobiliary: Status post cholecystectomy which may account for the chronic intra and extrahepatic ductal dilatation. No choledocholithiasis. No space-occupying mass the liver. There is colonic interposition noted. Pancreas: Normal pancreas without mass. The main pancreatic duct is visualized but is normal in caliber. Spleen: No splenomegaly or mass. Adrenals/Urinary Tract: Normal bilateral adrenal glands. Mild atrophy of left kidney. No enhancing renal masses. Nonobstructing left-sided renal calculi in the upper and lower pole, the largest measuring approximately 6 x 3 mm in the lower pole. Chronic ectasia of the intrarenal collecting system on the left with bilateral extrarenal pelves. Distal left ureteral stone is stable measuring 4 mm adjacent to the medial wall of the left acetabulum. No hydroureteronephrosis is apparent. The urinary bladder is physiologically distended. Stomach/Bowel: Small hiatal hernia with decompressed stomach. 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 The patient is status post small bowel resection with enteric sutures now noted in the right hemiabdomen, previously seen in the left hemiabdomen and may be a reflection of the mesenteric twist. Desiccated stool is noted within large bowel. The appendix is not discretely identified. Moderate sigmoid diverticulosis. Vascular/Lymphatic: There are mildly enlarged subcentimeter short axis mesenteric lymph nodes surrounded by hazy mesenteric edema. A lymphoproliferative disorder is not excluded versus reactive mesenteric adenopathy. These lymph nodes are approximately 1 cm short axis but more prominent than on prior. Reproductive: Enlarged prostate is stable measuring 5.8 x 3.7 cm in transverse by AP dimension. Other: Small amount of free fluid is noted in the pelvis. Hazy mesenteric edema is noted. Musculoskeletal: Lumbar spondylitic degenerative disc disease L3-4 and to a greater extent L4-5. No acute osseous abnormality. IMPRESSION: 1. 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 No bowel perforation. Mesenteric edema is noted at the root of the mesentery with mildly enlarged mesenteric lymph nodes possibly representing a lymphoproliferative state. New small volume of ascites is seen in the lower pelvis. These results were called by telephone at the time of interpretation on 10/14/2017 at 1:15 am to Dr. Veatrice Kells , who verbally acknowledged these results. 2. Chronic mild atrophy of the left kidney with chronic hydroureteronephrosis. Left-sided nephrolithiasis and distal left pelvic ureteral stones are stable. 3. Status post cholecystectomy  with chronic intra and extrahepatic ductal dilatation possibly representing post cholecystectomy change and reservoir effect. 4. Lumbar spondylosis. 5. Paraseptal and centrilobular emphysema at the lung bases. Electronically Signed   By: Ashley Royalty M.D.   On: 10/14/2017 01:15    EKG  EKG  Interpretation  Date/Time:  Saturday October 13 2017 21:41:16 EST Ventricular Rate:  61 PR Interval:  138 QRS Duration: 88 QT Interval:  414 QTC Calculation: 416 R Axis:   87 Text Interpretation:  Normal sinus rhythm Anterior infarct , age undetermined Confirmed by Randal Buba, Marissa Lowrey ([id number]) on 10/13/2017 11:02:52 PM        Procedures Procedures (including critical care time)  Medications Ordered in ED  Medications  ondansetron (ZOFRAN) injection 4 mg (not administered)  sodium chloride 0.9 % bolus 500 mL (not administered)  fentaNYL (SUBLIMAZE) injection 50 mcg (not administered)  fentaNYL (SUBLIMAZE) injection 50 mcg (50 mcg Intravenous Given 10/13/17 2343)  sodium chloride 0.9 % bolus 1,000 mL (1,000 mLs Intravenous New Bag/Given 10/13/17 2347)  gi cocktail (Maalox,Lidocaine,Donnatal) (30 mLs Oral Given 10/13/17 2345)  iopamidol (ISOVUE-300) 61 % injection 100 mL (80 mLs Intravenous Contrast Given 10/14/17 0024)     Case d/w Dr. Excell Seltzer, send to ED at [place] d/w Dr. 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

## 2017-10-14 NOTE — Anesthesia Procedure Notes (Signed)
Procedure Name: Intubation Performed by: Lissa Morales, CRNA Pre-anesthesia Checklist: Patient identified, Emergency Drugs available, Suction available and Patient being monitored Patient Re-evaluated:Patient Re-evaluated prior to induction Oxygen Delivery Method: Circle system utilized Preoxygenation: Pre-oxygenation with 100% oxygen Induction Type: IV induction Ventilation: Mask ventilation without difficulty Laryngoscope Size: Mac and 4 Grade View: Grade I Tube type: Subglottic suction tube Tube size: 7.5 mm Number of attempts: 1 Airway Equipment and Method: Stylet and Oral airway Placement Confirmation: ETT inserted through vocal cords under direct vision,  positive ETCO2 and breath sounds checked- equal and bilateral Secured at: 21 cm Tube secured with: Tape Dental Injury: Teeth and Oropharynx as per pre-operative assessment

## 2017-10-15 ENCOUNTER — Inpatient Hospital Stay (HOSPITAL_COMMUNITY): Payer: Medicare HMO

## 2017-10-15 ENCOUNTER — Encounter (HOSPITAL_COMMUNITY): Payer: Self-pay | Admitting: General Surgery

## 2017-10-15 DIAGNOSIS — I10 Essential (primary) hypertension: Secondary | ICD-10-CM

## 2017-10-15 DIAGNOSIS — I255 Ischemic cardiomyopathy: Secondary | ICD-10-CM

## 2017-10-15 DIAGNOSIS — I351 Nonrheumatic aortic (valve) insufficiency: Secondary | ICD-10-CM

## 2017-10-15 DIAGNOSIS — K562 Volvulus: Secondary | ICD-10-CM

## 2017-10-15 DIAGNOSIS — J810 Acute pulmonary edema: Secondary | ICD-10-CM

## 2017-10-15 DIAGNOSIS — R748 Abnormal levels of other serum enzymes: Secondary | ICD-10-CM

## 2017-10-15 DIAGNOSIS — N170 Acute kidney failure with tubular necrosis: Secondary | ICD-10-CM

## 2017-10-15 DIAGNOSIS — R0989 Other specified symptoms and signs involving the circulatory and respiratory systems: Secondary | ICD-10-CM

## 2017-10-15 DIAGNOSIS — I5043 Acute on chronic combined systolic (congestive) and diastolic (congestive) heart failure: Secondary | ICD-10-CM

## 2017-10-15 LAB — CBC
HCT: 38.1 % — ABNORMAL LOW (ref 39.0–52.0)
Hemoglobin: 12.4 g/dL — ABNORMAL LOW (ref 13.0–17.0)
MCH: 28.8 pg (ref 26.0–34.0)
MCHC: 32.5 g/dL (ref 30.0–36.0)
MCV: 88.6 fL (ref 78.0–100.0)
Platelets: 162 K/uL (ref 150–400)
RBC: 4.3 MIL/uL (ref 4.22–5.81)
RDW: 15.8 % — ABNORMAL HIGH (ref 11.5–15.5)
WBC: 16.7 K/uL — ABNORMAL HIGH (ref 4.0–10.5)

## 2017-10-15 LAB — ECHOCARDIOGRAM COMPLETE
Height: 69 in
Weight: 2208.13 oz

## 2017-10-15 LAB — BASIC METABOLIC PANEL WITH GFR
Anion gap: 8 (ref 5–15)
BUN: 26 mg/dL — ABNORMAL HIGH (ref 6–20)
CO2: 22 mmol/L (ref 22–32)
Calcium: 8.1 mg/dL — ABNORMAL LOW (ref 8.9–10.3)
Chloride: 107 mmol/L (ref 101–111)
Creatinine, Ser: 1.44 mg/dL — ABNORMAL HIGH (ref 0.61–1.24)
GFR calc Af Amer: 50 mL/min — ABNORMAL LOW
GFR calc non Af Amer: 43 mL/min — ABNORMAL LOW
Glucose, Bld: 116 mg/dL — ABNORMAL HIGH (ref 65–99)
Potassium: 4.2 mmol/L (ref 3.5–5.1)
Sodium: 137 mmol/L (ref 135–145)

## 2017-10-15 IMAGING — DX DG CHEST 1V PORT
1 series · 1 of 1 positions shown · non-contrast
Comparison: Portable exam [1U] hours compared to [DATE]

CLINICAL DATA: Pulmonary edema, feeling better today, history
coronary artery disease, CML, CHF, ischemic cardiomyopathy, former
smoker

EXAM:
PORTABLE CHEST 1 VIEW

[chest ap]
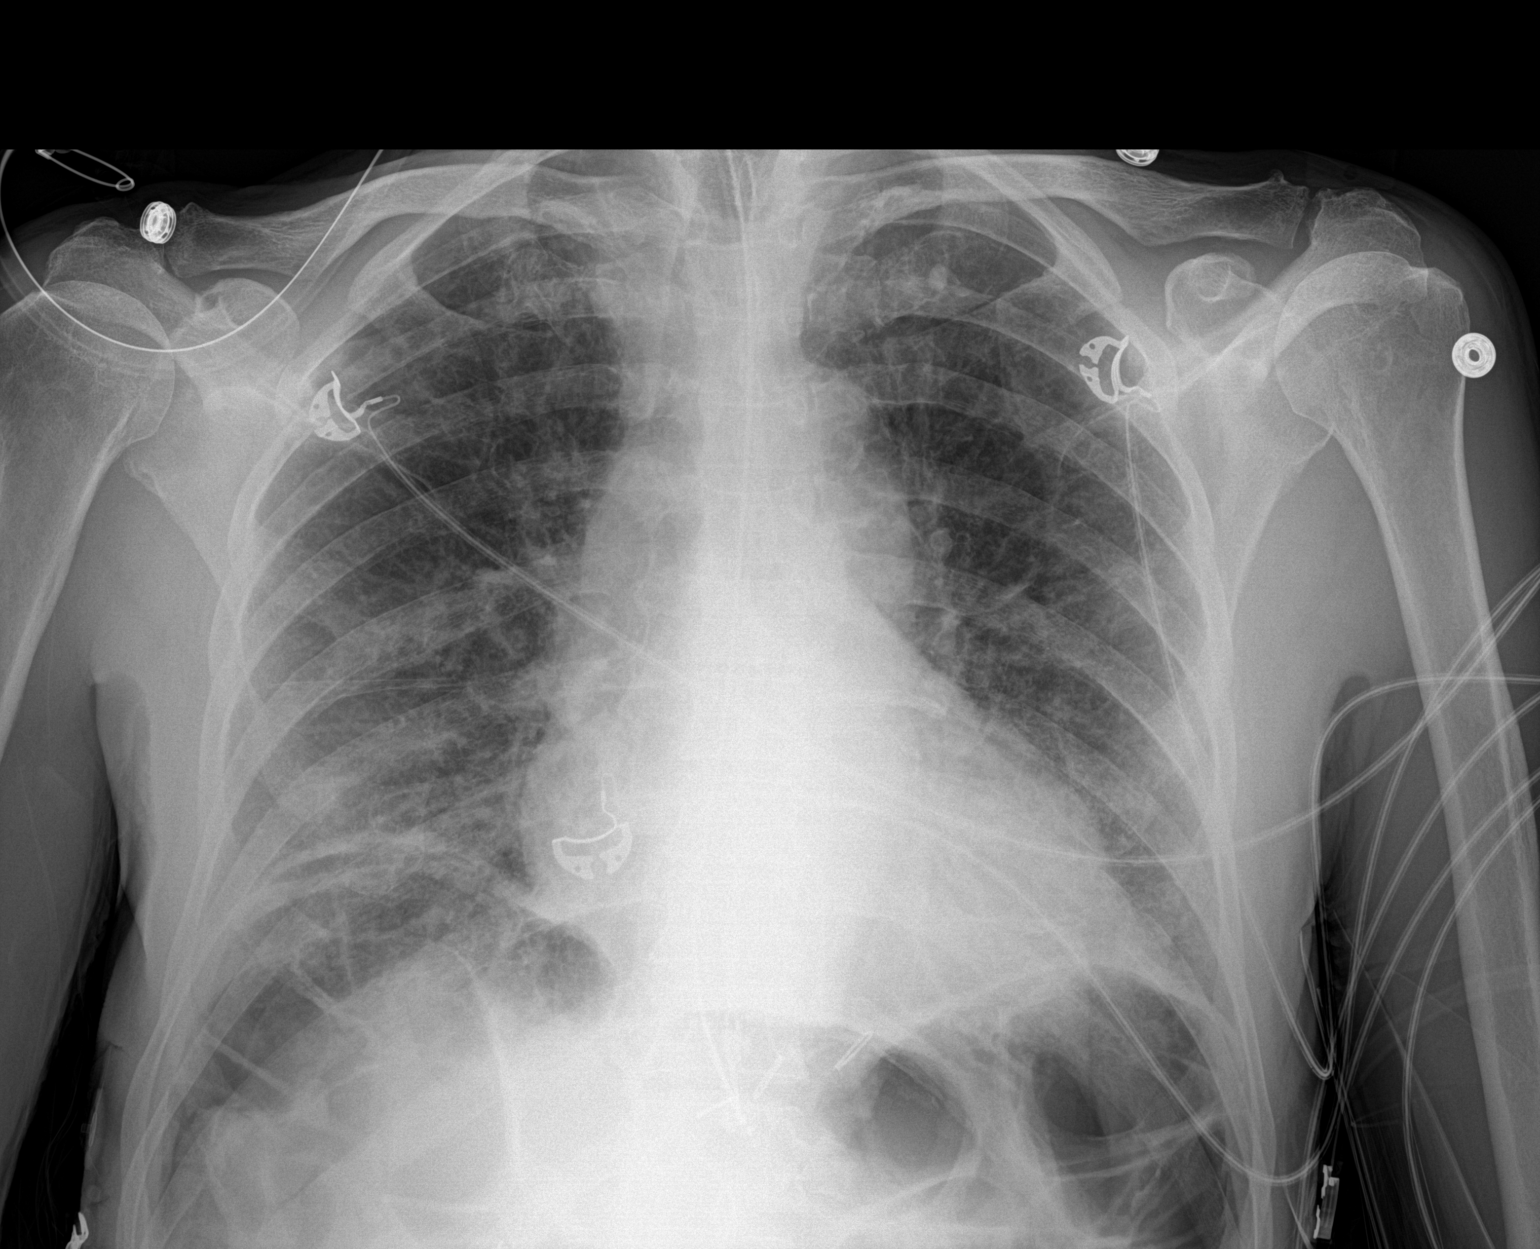

[1 of 1 positions shown; findings below may reference images not displayed]

FINDINGS: Nasogastric tube tip projects at GE junction, with proximal
side-port inadequately visualized.

Surgical clips at gastroesophageal junction.

Enlargement of cardiac silhouette.

Mediastinal contours and pulmonary vascularity normal.

BILATERAL pulmonary infiltrates question pulmonary edema improved
since previous exam.

No pleural effusion or pneumothorax.

Bowel interposition between liver and diaphragm.
IMPRESSION: Tip of nasogastric tube projects at the GE junction.

Slightly improved pulmonary edema.

## 2017-10-15 MED ORDER — ACETAMINOPHEN 10 MG/ML IV SOLN
1000.0000 mg | Freq: Four times a day (QID) | INTRAVENOUS | Status: AC
  Administered 2017-10-15 – 2017-10-16 (×4): 1000 mg via INTRAVENOUS
  Filled 2017-10-15 (×4): qty 100

## 2017-10-15 MED ORDER — HYDRALAZINE HCL 20 MG/ML IJ SOLN
10.0000 mg | Freq: Four times a day (QID) | INTRAMUSCULAR | Status: DC
  Administered 2017-10-15 – 2017-10-19 (×15): 10 mg via INTRAVENOUS
  Filled 2017-10-15 (×16): qty 1

## 2017-10-15 MED ORDER — SILVER NITRATE-POT NITRATE 75-25 % EX MISC
10.0000 "application " | Freq: Once | CUTANEOUS | Status: AC
  Administered 2017-10-15: 10 via TOPICAL
  Filled 2017-10-15: qty 10

## 2017-10-15 MED ORDER — GELATIN ABSORBABLE 12-7 MM EX MISC
1.0000 | Freq: Once | CUTANEOUS | Status: AC
  Administered 2017-10-15: 1 via TOPICAL
  Filled 2017-10-15: qty 1

## 2017-10-15 NOTE — Consult Note (Signed)
CARDIOLOGY CONSULT NOTE   Patient ID: Rollan Jedd MRN: [id number], DOB/AGE: [birth year]   Admit date: 10/13/2017 Date of Consult: 10/15/2017  Primary Physician: Drake Leach, MD Primary Cardiologist: Dr Aundra Dubin  Reason for consult:  [name] [name] is a 83 y.o. male who is being seen today for the evaluation of elevated troponin at the request of Dr Merrie Roof.  Problem List  Past Medical History:  Diagnosis Date  . Carotid artery occlusion   . Chronic systolic CHF (congestive heart failure) ([place])    a. 07/2013 Echo: EF 35-40%  . CKD (chronic kidney disease), stage III ([place])    pt. states that he does not have CKD  . CML (chronic myelocytic leukemia) ([place])   . Coronary artery disease    a. 09/2013 Cath: LM min irregs, LAD 95p(2.75x20 Promus Premier DES & 3.0x20 Promus Premier DES overlapping), D1 50-60, LCX 30p, OM1 61m, OM2 60-70p, RCA small, nl.  . Diverticulitis   . History of hiatal hernia   . History of kidney stones   . History of small bowel obstruction   . Hyperlipidemia   . Ischemic cardiomyopathy    a. 07/2013 Echo: EF 35-40%, apical septal, apical lat, apical AK, mod MR.  . Small bowel perforation ([place])    a. 11/2012 s/p emergent SB resection.    Past Surgical History:  Procedure Laterality Date  . CARDIAC CATHETERIZATION  10/09/2013  . CAROTID ENDARTERECTOMY    . COLON SURGERY  11/2012   BOWEL SURGERY   . CORONARY ANGIOPLASTY  10/09/2013   MID LAD  . ENDARTERECTOMY Right 09/17/2015   Procedure: Right Carotid ENDARTERECTOMY with Xenosure Patch Angioplasty;  Surgeon: Serafina Mitchell, MD;  Location: [facility];  Service: Vascular;  Laterality: Right;  . EYE SURGERY Bilateral   . INGUINAL HERNIA REPAIR Bilateral   . LAPAROSCOPIC CHOLECYSTECTOMY    . LAPAROTOMY N/A 10/14/2017   Procedure: EXPLORATORY LAPAROTOMY for SMALL BOWEL OBSTRUCTION, cecopexy;  Surgeon: Excell Seltzer, MD;  Location: [facility];  Service: General;  Laterality: N/A;  . LEFT HEART  CATHETERIZATION WITH CORONARY ANGIOGRAM N/A 10/09/2013   Procedure: LEFT HEART CATHETERIZATION WITH CORONARY ANGIOGRAM;  Surgeon: Larey Dresser, MD;  Location: [facility] CATH LAB;  Service: Cardiovascular;  Laterality: N/A;  . PERCUTANEOUS CORONARY STENT INTERVENTION (PCI-S)  10/09/2013   Procedure: PERCUTANEOUS CORONARY STENT INTERVENTION (PCI-S);  Surgeon: Larey Dresser, MD;  Location: [facility] CATH LAB;  Service: Cardiovascular;;     Allergies  Allergies  Allergen Reactions  . Percocet [Oxycodone-Acetaminophen] Itching  . Metronidazole Other (See Comments)    Insomnia and nervousness    HPI   83 year old male with h/o of recurrent small bowel obstruction and CAD with ischemic cardiomyopathy followed by Dr Aundra Dubin, last seen in May 2018. In 9/14, patient was hospitalized with recurrent abdominal pain from small bowel obstruction.  This was managed conservatively with NG tube/NPO and IV fluid.  In the hospital, the patient developed pulmonary edema and actually ended up being intubated.  Troponin was normal. Echo was done showing EF 35-40% with anteroseptal, anterolateral, and apical akinesis along with moderate AI.  He had no prior known cardiac disease.  It was thought that the patient had had an out of hospital MI at some point then developed pulmonary edema with IVF while NPO.  He was diuresed and extubated.  Creatinine was up to about 1.6 in the hospital so no cardiac cath was done (not thought to be urgent given normal creatinine).  LHC in 11/14  showed 95% mid LAD stenosis that was treated with 2 overlapping Promus DES.  Echo in 2/15 showed EF 30-35% with normal RV.  Coreg was cut back to 6.25 bid due to bradycardia and fatigue.  Had R CEA in 11/16 with Dr Trula Slade. 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 He developed gynecomastia and had to stop spironolactone.  Eplerenone caused nausea/vomiting.    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  He is currently recovering, denies any chest pain or SOB. He is in good spirits.   Inpatient Medications  . enoxaparin (LOVENOX) injection  40 mg Subcutaneous Q24H  . gelatin adsorbable  1 each Topical Once  . ipratropium-albuterol  3 mL Nebulization Q6H  . metoprolol tartrate  5 mg Intravenous Q6H  . pantoprazole (PROTONIX) IV  40 mg Intravenous QHS  . silver nitrate applicators  10 application Topical Once    Family History Family History  Problem Relation Age of Onset  . Emphysema Father        died @ 60- smoked  . Heart disease Father   . Kidney Stones Father   . Diabetes Mother        died @ 47  . Stroke Mother   . Head & neck cancer Brother   . Lymphoma Brother   . Colon cancer Neg Hx   . Stomach cancer Neg Hx   . Rectal cancer Neg Hx   . Esophageal cancer Neg Hx   . Liver cancer Neg Hx      Social History Social History   Socioeconomic History  . Marital status: Married    Spouse name: Not on file  . Number of children: 4  . Years of education: Not on file  . Highest education level: Not on file  Social Needs  . Financial resource strain: Not on file  . Food insecurity - worry: Not on file  . Food insecurity - inability: Not on file  . Transportation needs - medical: Not on file  . Transportation needs - non-medical: Not on file  Occupational History  . Occupation: retired  Tobacco Use  . Smoking status: Former Smoker    Packs/day: 1.00    Years: 45.00    Pack years: 45.00    Types: Cigarettes    Last attempt to quit: 06/09/1999    Years since quitting: 18.3  . Smokeless tobacco: Never Used  Substance and Sexual Activity  . Alcohol use: No    Alcohol/week: 0.0 oz  . Drug use: No  . Sexual activity: Not on file  Other Topics Concern  . Not on file  Social History Narrative   Pt lives in [place] with his wife of 14 yrs (second wife).   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  Very active @ home.  Still push mows his yard.     Review of Systems  General:  No chills, fever, night sweats or weight changes.  Cardiovascular:  No chest pain, dyspnea on exertion, edema, orthopnea, palpitations, paroxysmal nocturnal dyspnea. Dermatological: No rash, lesions/masses Respiratory: No cough, dyspnea Urologic: No hematuria, dysuria Abdominal:   No nausea, vomiting, diarrhea, bright red blood per rectum, melena, or hematemesis Neurologic:  No visual changes, wkns, changes in mental status. All other systems reviewed and are otherwise negative except as noted above.  Physical  Exam  Blood pressure (!) 118/59, pulse 71, temperature 97.6 F (36.4 C), temperature source Oral, resp. rate 11, height 5\' 9"  (1.753 m), weight 138 lb 0.1 oz (62.6 kg), SpO2 96 %.  General: Pleasant, NAD, nasal canula Psych: Normal affect. Neuro: Alert and oriented X 3. Moves all extremities spontaneously. HEENT: Normal  Neck: Supple without bruits or JVD. Lungs:  Resp regular and unlabored, CTA. Heart: RRR no s3, s4, or murmurs. Abdomen: Soft, non-tender, non-distended, BS + x 4.  Extremities: No clubbing, cyanosis or edema. DP/PT/Radials 2+ and equal bilaterally.  Labs  Recent Labs    10/13/17 2143 10/14/17 0026  TROPONINI 0.03* 0.03*   Lab Results  Component Value Date   WBC 16.7 (H) 10/15/2017   HGB 12.4 (L) 10/15/2017   HCT 38.1 (L) 10/15/2017   MCV 88.6 10/15/2017   PLT 162 10/15/2017    Recent Labs  Lab 10/13/17 2143 10/15/17 0321  NA 136 137  K 4.0 4.2  CL 109 107  CO2 20* 22  BUN 25* 26*  CREATININE 1.48* 1.44*  CALCIUM 8.5* 8.1*  PROT 7.1  --   BILITOT 0.4  --   ALKPHOS 116  --   ALT 47  --   AST 45*  --   GLUCOSE 113* 116*   Lab Results  Component Value Date   CHOL 69 08/01/2017   HDL 30 (L) 08/01/2017   LDLCALC 28 08/01/2017   TRIG 53 08/01/2017   Lab Results  Component  Value Date   DDIMER 3.02 (H) 08/09/2013   Invalid input(s): POCBNP  Radiology/Studies  Ct Abdomen Pelvis W Contrast  Result Date: 10/14/2017 CLINICAL DATA:  Mid to upper abdominal pain for few days. History of small bowel perforation and renal insufficiency. EXAM: CT ABDOMEN AND PELVIS WITH CONTRAST TECHNIQUE: Multidetector CT imaging of the abdomen and pelvis was performed using the standard protocol following bolus administration of intravenous contrast. CONTRAST:  28mL ISOVUE-300 IOPAMIDOL (ISOVUE-300) INJECTION 61% COMPARISON:  None. FINDINGS: Lower chest: Normal size heart without pericardial effusion. Subpleural fibrosis noted at each lung base. Mild centrilobular emphysema and paraseptal emphysema. Hepatobiliary: Status post cholecystectomy which may account for the chronic intra and extrahepatic ductal dilatation. No choledocholithiasis. No space-occupying mass the liver. There is colonic interposition noted. Pancreas: Normal pancreas without mass. The main pancreatic duct is visualized but is normal in caliber. Spleen: No splenomegaly or mass. Adrenals/Urinary Tract: Normal bilateral adrenal glands. Mild atrophy of left kidney. No enhancing renal masses. Nonobstructing left-sided renal calculi in the upper and lower pole, the largest measuring approximately 6 x 3 mm in the lower pole. Chronic ectasia of the intrarenal collecting system on the left with bilateral extrarenal pelves. Distal left ureteral stone is stable measuring 4 mm adjacent to the medial wall of the left acetabulum. No hydroureteronephrosis is apparent. The urinary bladder is physiologically distended. Stomach/Bowel: Small hiatal hernia with decompressed stomach. 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 The patient is status post small bowel resection with enteric sutures now  noted in the right hemiabdomen, previously seen in the left hemiabdomen and may be a reflection of the mesenteric twist. Desiccated stool is noted within large bowel. The appendix is not discretely identified. Moderate sigmoid diverticulosis. Vascular/Lymphatic: There are mildly enlarged subcentimeter short axis mesenteric lymph nodes surrounded by hazy mesenteric edema. A lymphoproliferative disorder is not excluded versus reactive mesenteric adenopathy. These lymph nodes  are approximately 1 cm short axis but more prominent than on prior. Reproductive: Enlarged prostate is stable measuring 5.8 x 3.7 cm in transverse by AP dimension. Other: Small amount of free fluid is noted in the pelvis. Hazy mesenteric edema is noted. Musculoskeletal: Lumbar spondylitic degenerative disc disease L3-4 and to a greater extent L4-5. No acute osseous abnormality. IMPRESSION: 1. 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 No bowel perforation. Mesenteric edema is noted at the root of the mesentery with mildly enlarged mesenteric lymph nodes possibly representing a lymphoproliferative state. New small volume of ascites is seen in the lower pelvis. These results were called by telephone at the time of interpretation on 10/14/2017 at 1:15 am to Dr. Veatrice Kells , who verbally acknowledged these results. 2. Chronic mild atrophy of the left kidney with chronic hydroureteronephrosis. Left-sided nephrolithiasis and distal left pelvic ureteral stones are stable. 3. Status post cholecystectomy with chronic intra and extrahepatic ductal dilatation possibly representing post cholecystectomy change and reservoir effect. 4. Lumbar spondylosis. 5. Paraseptal and centrilobular emphysema at the lung bases. Electronically Signed   By: Ashley Royalty M.D.   On: 10/14/2017 01:15   Dg Chest Port 1 View  Result Date: 10/15/2017 CLINICAL DATA:  Pulmonary edema, feeling better today, history coronary  artery disease, CML, CHF, ischemic cardiomyopathy, former smoker EXAM: PORTABLE CHEST 1 VIEW COMPARISON:  Portable exam 1132 hours compared to 10/14/2017 FINDINGS: Nasogastric tube tip projects at GE junction, with proximal side-port inadequately visualized. Surgical clips at gastroesophageal junction. Enlargement of cardiac silhouette. Mediastinal contours and pulmonary vascularity normal. BILATERAL pulmonary infiltrates question pulmonary edema improved since previous exam. No pleural effusion or pneumothorax. Bowel interposition between liver and diaphragm. IMPRESSION: Tip of nasogastric tube projects at the GE junction. Slightly improved pulmonary edema. Electronically Signed   By: Lavonia Dana M.D.   On: 10/15/2017 12:12   Dg Chest Port 1 View  Result Date: 10/14/2017 CLINICAL DATA:  Shortness of breath. EXAM: PORTABLE CHEST 1 VIEW COMPARISON:  Radiographs of August 06, 2017. FINDINGS: Stable cardiomediastinal silhouette. Atherosclerosis of thoracic aorta is noted. Interval placement of nasogastric tube with distal tip in expected position of gastroesophageal junction. Increased bilateral diffuse interstitial densities are noted throughout both lungs concerning for worsening pulmonary edema or atypical inflammation. No pneumothorax is noted. Bony thorax is unremarkable. IMPRESSION: Aortic atherosclerosis. Interval placement of a nasogastric tube with distal tip in expected position of gastroesophageal junction. Increased bilateral diffuse interstitial densities are noted concerning for pulmonary edema or atypical inflammation. Electronically Signed   By: Marijo Conception, M.D.   On: 10/14/2017 08:50   Echocardiogram -  04/2017 Left ventricle: The cavity size was normal. There was moderate   concentric hypertrophy. Systolic function was moderately to   severely reduced. The estimated ejection fraction was in the   range of 30% to 35%. There is akinesis of the basal and mid   anteroseptal, anterior,  anterolateral and apical septal anterior   and lateral walls. Doppler parameters are consistent with   abnormal left ventricular relaxation (grade 1 diastolic   dysfunction). - Aortic valve: There was mild to moderate regurgitation. - Mitral valve: Calcified annulus. Mildly thickened leaflets .   There was mild regurgitation. - Right ventricle: Systolic function was normal. - Right atrium: The atrium was normal in size. - Pulmonary arteries: Systolic pressure was mildly increased. PA   peak pressure: 35 mm Hg (S). - Inferior vena cava: The vessel was normal in size. -  Pericardium, extracardiac: There was no pericardial effusion.   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  Appears to have received a total of 2 L of IVF intraoperatively.   1. Troponin elevation  - minimal with flat trend - bedside echo shows stable LVEF 30-35% with good contraction of the basal segments but akinesis of the mid and apical anteroseptal and anterior segments.  - he is completely asymptomatic.   2. Acute on chronic diastolic CHF - from fluid resuscitation during surgery - CXR shows improvement in pulmonary edema - no significant fluid oveload on physical exam - Weight today 138 lbs, baeline 140 llbs - I would hold off diuretics  3. Acute hypoxemic respiratory failure due to volume overload Mild Obstructive Lung Disease, Emphysema noted on CT Per PCCM Continue O2 to support sats 90-95%, wean to off as tolerated  Reduce IVF to 50 ml/hr  Duoneb  Follow intermittent CXR  4. Acute on chronic kidney disease -  resolved Avoid nephrotoxic agents, ensure adequate renal perfusion Crea last year 2.2, today 1.4  5. Hypertension -  - add hydralazine 10 mg iv Q6H as he is NPO  6. CAD: S/p DES to mLAD with overlapping Promus stents in 11/14.  Of note, there was residual 50% stenosis just beyond the stent margin.    - Continue ASA and statin.      Signed, Ena Dawley, MD, [facility] 10/15/2017, 12:30 PM

## 2017-10-15 NOTE — Progress Notes (Signed)
1 Day Post-Op    CC: Abdominal pain  Subjective: NG suction not working.  He is having a good deal of pain.  No complaints of chest pain.  Midline dressings for blood abdomen is extremely tender to even light palpation or touch.  Midline dressing is full of blood.  We have taken the dressing down.  Bleeding in the midline. Objective: Vital signs in last 24 hours: Temp:  [97.2 F (36.2 C)-97.7 F (36.5 C)] 97.7 F (36.5 C) (11/26 0800) Pulse Rate:  [56-75] 56 (11/26 0800) Resp:  [9-20] 10 (11/26 0800) BP: (109-185)/(48-94) 145/81 (11/26 0800) SpO2:  [87 %-99 %] 96 % (11/26 0800) FiO2 (%):  [45 %-55 %] 45 % (11/25 1643) Weight:  [62.6 kg (138 lb 0.1 oz)] 62.6 kg (138 lb 0.1 oz) (11/25 1300)  320 IV recorded Urine 3545 recorded NG/emesis 60 Afebrile, VSS Creatinine stable 1.44 Troponin  0.03 x 2 WBC 16.7 CXR yesterday:  Interval placement of a nasogastric tube with distal tip in expected position of gastroesophageal junction. Increased bilateral diffuse interstitial densities are noted concerning for pulmonary edema or atypical inflammation.    Intake/Output from previous day: 11/25 0701 - 11/26 0700 In: 621 [I.V.:320] Out: 3605 [Urine:3545; Emesis/NG output:60] Intake/Output this shift: No intake/output data recorded.  General appearance: alert, cooperative, no distress and tender to any touch Resp: clear to auscultation bilaterally GI: soft, very tender, bleeding from midline incision, No bowel sounds.   Extremities: extremities normal, atraumatic, no cyanosis or edema  Lab Results:  Recent Labs    10/13/17 2143 10/15/17 0321  WBC 10.0 16.7*  HGB 12.5* 12.4*  HCT 38.7* 38.1*  PLT 167 162    BMET Recent Labs    10/13/17 2143 10/15/17 0321  NA 136 137  K 4.0 4.2  CL 109 107  CO2 20* 22  GLUCOSE 113* 116*  BUN 25* 26*  CREATININE 1.48* 1.44*  CALCIUM 8.5* 8.1*   PT/INR No results for input(s): LABPROT, INR in the last 72 hours.  Recent Labs  Lab  10/13/17 2143  AST 45*  ALT 47  ALKPHOS 116  BILITOT 0.4  PROT 7.1  ALBUMIN 3.9     Lipase     Component Value Date/Time   LIPASE 35 10/13/2017 2143   Prior to Admission medications   Medication Sig Start Date End Date Taking? Authorizing Provider  Ascorbic Acid (VITAMIN C PO) Take 1,000 mg by mouth daily.    Yes [provider]  aspirin EC 81 MG tablet Take 1 tablet (81 mg total) by mouth daily. 10/02/13  Yes Larey Dresser, MD  BOSULIF 100 MG tablet Take 400 mg by mouth daily with breakfast.  08/30/17  Yes [provider]  calcium carbonate (CALCIUM 600) 600 MG TABS tablet Take 600 mg by mouth daily.   Yes [provider]  carvedilol (COREG) 12.5 MG tablet TAKE 1/2 TABLET TWICE DAILY WITH A MEAL 04/23/17  Yes Larey Dresser, MD  [employer] VITAMIN A PO Take 2,400 mcg by mouth daily.   Yes [provider]  cyanocobalamin 1000 MCG tablet Take 1,000 mcg by mouth daily.   Yes [provider]  losartan (COZAAR) 25 MG tablet TAKE 1/2 TABLET EVERY DAY 09/04/17  Yes Larey Dresser, MD  NIACIN PO Take 1 tablet by mouth daily.   Yes [provider]  pyridoxine (B-6) 100 MG tablet Take 100 mg by mouth daily.   Yes [provider]  Thiamine Mononitrate (VITAMIN B1 PO)  Take 250 mg by mouth daily.   Yes [provider]  Respiratory Therapy Supplies (FLUTTER) DEVI Use as directed Patient not taking: Reported on 09/10/2017 08/17/16   Tanda Rockers, MD  rosuvastatin (CRESTOR) 5 MG tablet Take 1 tablet (5 mg total) by mouth every other day. Patient not taking: Reported on 10/14/2017 06/15/17   Larey Dresser, MD      Medications: . enoxaparin (LOVENOX) injection  40 mg Subcutaneous Q24H  . ipratropium-albuterol  3 mL Nebulization Q6H  . metoprolol tartrate  5 mg Intravenous Q6H  . pantoprazole (PROTONIX) IV  40 mg Intravenous QHS   Anti-infectives (From admission, onward)   Start     Dose/Rate Route Frequency Ordered  Stop   10/14/17 0415  cefOXitin (MEFOXIN) 2 g in dextrose 5 % 50 mL IVPB     2 g 100 mL/hr over 30 Minutes Intravenous  Once 10/14/17 0412 10/14/17 0515     . dextrose 5% lactated ringers with KCl 20 mEq/L 20 mL/hr at 10/15/17 0600      Assessment/Plan Midgut volvulus Exploratory laparotomy for small bowel obstruction, cecopexy 10/14/17 Dr. Excell Seltzer POD 1 History of hiatal hernia repair cholecystectomy and partial colectomy 2014/small bowel perforation/small bowel resection 11/2012  History of carotid artery occlusion CE 10/28/ Hx chronic systolic congestive heart failure (LVH, EF 30-35% akinesis, multiple areas, grade 1 diastolic dysfunction, Mild to moderated AR) Hx chronic kidney disease-stage III  - creatinine 1.48 -1.44 range CAD with LAD stent Chronic myelocytic leukemia - on oral chemotherapy.    FEN: IV fluids/NPO ID: Cefoxitin preop dose only DVT: Lovenox Foley: in Follow-up: Dr. Excell Seltzer  Plan:  I have ask Cardiology to see and help with care.  I will keep him in step down for now.  Increase fluids to 75 ml/hr and attend to dressing this AM.  His oncologist is in [facility], may ask our Oncologist to see and help with care while he is here. Daily weights.    LOS: 1 day    [name] [name] 10/15/2017 [phone]

## 2017-10-15 NOTE — Progress Notes (Signed)
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  This morning when extubated he was noted to have an increasing oxygen requirement, initially 2 L and then increased eventually to a facemask 50% to maintain SPO2 greater than 90%.  SUBJECTIVE:  Pt reports ongoing soreness in his abdomen.  Worried about his wife as she is slated to have AVR on 12/4.  Denies SOB.  States he didn't even realize he had a breathing problem.     VITAL SIGNS: Temp:  [97.2 F (36.2 C)-97.7 F (36.5 C)] 97.7 F (36.5 C) (11/26 0800) Pulse Rate:  [56-75] 67 (11/26 0900) Resp:  [9-20] 14 (11/26 0900) BP: (109-185)/(48-94) 169/68 (11/26 0900) SpO2:  [87 %-99 %] 97 % (11/26 0900) FiO2 (%):  [45 %-55 %] 45 % (11/25 1643) Weight:  [138 lb 0.1 oz (62.6 kg)] 138 lb 0.1 oz (62.6 kg) (11/25 1300)  PHYSICAL EXAMINATION: General: thin elderly male in NAD, sitting up in bed HEENT: MM pink/moist, no jvd, ngt in place JKK:[id number]  Neuro: AAOx4, speech clear, MAE  CV: s1s2 rrr, no m/r/g PULM: even/non-labored, lungs bilaterally clear  GI: soft, tender to touch, midline abd dressing c/d/i, small amount blood on dressing (marked)   Extremities: warm/dry, no edema  Skin: no rashes or lesions   Recent Labs  Lab 10/13/17 2143 10/15/17 0321  NA 136 137  K 4.0 4.2  CL 109 107  CO2 20* 22  BUN 25* 26*  CREATININE 1.48* 1.44*  GLUCOSE 113* 116*   Recent Labs  Lab 10/13/17 2143 10/15/17 0321  HGB 12.5* 12.4*  HCT 38.7* 38.1*  WBC 10.0 16.7*    PLT 167 162   Ct Abdomen Pelvis W Contrast  Result Date: 10/14/2017 CLINICAL DATA:  Mid to upper abdominal pain for few days. History of small bowel perforation and renal insufficiency. EXAM: CT ABDOMEN AND PELVIS WITH CONTRAST TECHNIQUE: Multidetector CT imaging of the abdomen and pelvis was performed using the standard protocol following bolus administration of intravenous contrast. CONTRAST:  48mL ISOVUE-300 IOPAMIDOL (ISOVUE-300) INJECTION 61% COMPARISON:  None. FINDINGS: Lower chest: Normal size heart without pericardial effusion. Subpleural fibrosis noted at each lung base. Mild centrilobular emphysema and paraseptal emphysema. Hepatobiliary: Status post cholecystectomy which may account for the chronic intra and extrahepatic ductal dilatation. No choledocholithiasis. No space-occupying mass the liver. There is colonic interposition noted. Pancreas: Normal pancreas without mass. The main pancreatic duct is visualized but is normal in caliber. Spleen: No splenomegaly or mass. Adrenals/Urinary Tract: Normal bilateral adrenal glands. Mild atrophy of left kidney. No enhancing renal masses. Nonobstructing left-sided renal calculi in the upper and lower pole, the largest measuring approximately 6 x 3 mm in the lower pole. Chronic ectasia of the intrarenal collecting system on the left with bilateral extrarenal pelves. Distal left ureteral stone is stable measuring 4 mm adjacent to the medial wall of the left acetabulum. No hydroureteronephrosis is apparent. The urinary bladder is physiologically distended. Stomach/Bowel: Small hiatal hernia with decompressed stomach. New mild-to-moderately distended small bowel loops are identified with twisting of the small bowel mesentery concerning for  midgut volvulus contributing to changes of early small-bowel obstruction. The patient is status post small bowel resection with enteric sutures now noted in the right hemiabdomen, previously seen in the left hemiabdomen and  may be a reflection of the mesenteric twist. Desiccated stool is noted within large bowel. The appendix is not discretely identified. Moderate sigmoid diverticulosis. Vascular/Lymphatic: There are mildly enlarged subcentimeter short axis mesenteric lymph nodes surrounded by hazy mesenteric edema. A lymphoproliferative disorder is not excluded versus reactive mesenteric adenopathy. These lymph nodes are approximately 1 cm short axis but more prominent than on prior. Reproductive: Enlarged prostate is stable measuring 5.8 x 3.7 cm in transverse by AP dimension. Other: Small amount of free fluid is noted in the pelvis. Hazy mesenteric edema is noted. Musculoskeletal: Lumbar spondylitic degenerative disc disease L3-4 and to a greater extent L4-5. No acute osseous abnormality. IMPRESSION: 1. 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 No bowel perforation. Mesenteric edema is noted at the root of the mesentery with mildly enlarged mesenteric lymph nodes possibly representing a lymphoproliferative state. New small volume of ascites is seen in the lower pelvis. These results were called by telephone at the time of interpretation on 10/14/2017 at 1:15 am to Dr. Veatrice Kells , who verbally acknowledged these results. 2. Chronic mild atrophy of the left kidney with chronic hydroureteronephrosis. Left-sided nephrolithiasis and distal left pelvic ureteral stones are stable. 3. Status post cholecystectomy with chronic intra and extrahepatic ductal dilatation possibly representing post cholecystectomy change and reservoir effect. 4. Lumbar spondylosis. 5. Paraseptal and centrilobular emphysema at the lung bases. Electronically Signed   By: Ashley Royalty M.D.   On: 10/14/2017 01:15   Dg Chest Port 1 View  Result Date: 10/14/2017 CLINICAL DATA:  Shortness of breath. EXAM: PORTABLE CHEST 1 VIEW COMPARISON:  Radiographs of August 06, 2017. FINDINGS: Stable cardiomediastinal  silhouette. Atherosclerosis of thoracic aorta is noted. Interval placement of nasogastric tube with distal tip in expected position of gastroesophageal junction. Increased bilateral diffuse interstitial densities are noted throughout both lungs concerning for worsening pulmonary edema or atypical inflammation. No pneumothorax is noted. Bony thorax is unremarkable. IMPRESSION: Aortic atherosclerosis. Interval placement of a nasogastric tube with distal tip in expected position of gastroesophageal junction. Increased bilateral diffuse interstitial densities are noted concerning for pulmonary edema or atypical inflammation. Electronically Signed   By: Marijo Conception, M.D.   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  Appears to have received a total of 2 L of IVF intraoperatively.  This morning upon extubation was noted to be in hypoxemic respiratory failure, most likely due to fluid resuscitation in the setting of systolic heart failure with concurrent acute kidney injury.  #Acute hypoxemic respiratory failure due to volume overload #Mild Obstructive Lung Disease, Emphysema noted on CT P: Continue O2 to support sats 90-95%, wean to off as tolerated  Reduce IVF to 50 ml/hr  Duoneb  Follow intermittent CXR  # Acute on chronic kidney disease - resolved  P: Trend BMP / urinary output Replace electrolytes as indicated Avoid nephrotoxic agents, ensure adequate renal  perfusion  Noe Gens, NP-C [facility] Pgr: [phone] or if no answer [phone] 10/15/2017, 10:08 AM  STAFF NOTE: I, Merrie Roof, MD FACP have personally reviewed patient's available data, including medical history, events of note, physical examination and test results as part of my evaluation. I have discussed with resident/NP and other care providers such as pharmacist, RN and RRT. In addition, I personally evaluated patient and elicited key findings of: awake, no distress, jvd remains, on 5 liters from 15 liters O2, slight coarse BS, abdo soft, tender, wound dressing bloody mild, no edema, no well defined murmur, echo I rviewed showed ef 25% June 2018 and mid Ai, I would be inclined to re assess the Aortic valve insuff with this event, lasix did well, pcxr I reviewed showed pulm edema, will repeat this today to guide lasix needs with some O2 needs remaining, keep after load controlled, maintenace fluids to 50 per surgery need, , his renal fxn tolerated lasix overall and is 1,4, will repeat this in am, trop was neg, he has stents, I udpated pt infull, keep even to neg today  Lavon Paganini. Titus Mould, MD, [place] Pgr: [facility] 10/15/2017 11:05 AM

## 2017-10-15 NOTE — Plan of Care (Signed)
  Progressing Education: Knowledge of General Education information will improve 10/15/2017 1959 - Progressing by Naomie Dean, RN Health Behavior/Discharge Planning: Ability to manage health-related needs will improve 10/15/2017 1959 - Progressing by Naomie Dean, RN Clinical Measurements: Will remain free from infection 10/15/2017 1959 - Progressing by Naomie Dean, RN Diagnostic test results will improve 10/15/2017 1959 - Progressing by Naomie Dean, RN Respiratory complications will improve 10/15/2017 1959 - Progressing by Naomie Dean, RN Cardiovascular complication will be avoided 10/15/2017 1959 - Progressing by Naomie Dean, RN Activity: Risk for activity intolerance will decrease 10/15/2017 1959 - Progressing by Naomie Dean, RN Nutrition: Adequate nutrition will be maintained 10/15/2017 1959 - Progressing by Naomie Dean, RN Coping: Level of anxiety will decrease 10/15/2017 1959 - Progressing by Naomie Dean, RN Elimination: Will not experience complications related to bowel motility 10/15/2017 1959 - Progressing by Naomie Dean, RN Will not experience complications related to urinary retention 10/15/2017 1959 - Progressing by Naomie Dean, RN Pain Managment: General experience of comfort will improve 10/15/2017 1959 - Progressing by Naomie Dean, RN Safety: Ability to remain free from injury will improve 10/15/2017 1959 - Progressing by Naomie Dean, RN Skin Integrity: Risk for impaired skin integrity will decrease 10/15/2017 1959 - Progressing by Naomie Dean, RN Clinical Measurements: Ability to maintain clinical measurements within normal limits will improve 10/15/2017 1959 - Progressing by Naomie Dean, RN Postoperative complications will  be avoided or minimized 10/15/2017 1959 - Progressing by Naomie Dean, RN Skin Integrity: Demonstration of wound healing without infection will improve 10/15/2017 1959 - Progressing by Naomie Dean, RN

## 2017-10-15 NOTE — Progress Notes (Signed)
  Echocardiogram 2D Echocardiogram has been performed.  [name] [name] 10/15/2017, 1:48 PM

## 2017-10-16 LAB — CBC
HCT: 35.4 % — ABNORMAL LOW (ref 39.0–52.0)
Hemoglobin: 11.3 g/dL — ABNORMAL LOW (ref 13.0–17.0)
MCH: 28.7 pg (ref 26.0–34.0)
MCHC: 31.9 g/dL (ref 30.0–36.0)
MCV: 89.8 fL (ref 78.0–100.0)
Platelets: 151 10*3/uL (ref 150–400)
RBC: 3.94 MIL/uL — ABNORMAL LOW (ref 4.22–5.81)
RDW: 15.9 % — ABNORMAL HIGH (ref 11.5–15.5)
WBC: 14.3 10*3/uL — ABNORMAL HIGH (ref 4.0–10.5)

## 2017-10-16 LAB — BASIC METABOLIC PANEL
Anion gap: 7 (ref 5–15)
BUN: 28 mg/dL — ABNORMAL HIGH (ref 6–20)
CO2: 25 mmol/L (ref 22–32)
Calcium: 8.2 mg/dL — ABNORMAL LOW (ref 8.9–10.3)
Chloride: 108 mmol/L (ref 101–111)
Creatinine, Ser: 1.48 mg/dL — ABNORMAL HIGH (ref 0.61–1.24)
GFR calc Af Amer: 48 mL/min — ABNORMAL LOW (ref >60)
GFR calc non Af Amer: 42 mL/min — ABNORMAL LOW (ref >60)
Glucose, Bld: 118 mg/dL — ABNORMAL HIGH (ref 65–99)
Potassium: 4 mmol/L (ref 3.5–5.1)
Sodium: 140 mmol/L (ref 135–145)

## 2017-10-16 MED ORDER — FUROSEMIDE 20 MG PO TABS
20.0000 mg | ORAL_TABLET | Freq: Every day | ORAL | Status: DC
  Administered 2017-10-16: 20 mg via ORAL
  Filled 2017-10-16 (×3): qty 1

## 2017-10-16 MED ORDER — IPRATROPIUM-ALBUTEROL 0.5-2.5 (3) MG/3ML IN SOLN
3.0000 mL | Freq: Three times a day (TID) | RESPIRATORY_TRACT | Status: DC
  Administered 2017-10-16 – 2017-10-17 (×4): 3 mL via RESPIRATORY_TRACT
  Filled 2017-10-16 (×4): qty 3

## 2017-10-16 MED ORDER — MENTHOL 3 MG MT LOZG
1.0000 | LOZENGE | OROMUCOSAL | Status: DC | PRN
  Filled 2017-10-16: qty 9

## 2017-10-16 MED ORDER — ASPIRIN 81 MG PO CHEW
81.0000 mg | CHEWABLE_TABLET | Freq: Every day | ORAL | Status: DC
  Administered 2017-10-16 – 2017-10-19 (×4): 81 mg via ORAL
  Filled 2017-10-16 (×4): qty 1

## 2017-10-16 MED ORDER — ROSUVASTATIN CALCIUM 5 MG PO TABS
5.0000 mg | ORAL_TABLET | Freq: Every day | ORAL | Status: DC
  Administered 2017-10-16: 5 mg via ORAL
  Filled 2017-10-16 (×3): qty 1

## 2017-10-16 MED ORDER — PHENOL 1.4 % MT LIQD
1.0000 | OROMUCOSAL | Status: DC | PRN
  Administered 2017-10-16: 1 via OROMUCOSAL
  Filled 2017-10-16: qty 177

## 2017-10-16 NOTE — Progress Notes (Signed)
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  This morning when extubated he was noted to have an increasing oxygen requirement, initially 2 L and then increased eventually to a facemask 50% to maintain SPO2 greater than 90%.  SUBJECTIVE:  Pt reports ongoing soreness in his abdomen.  Worried about his wife as she is slated to have AVR on 12/4.  Denies SOB.  States he didn't even realize he had a breathing problem.     VITAL SIGNS: Temp:  [97.4 F (36.3 C)-97.8 F (36.6 C)] 97.8 F (36.6 C) (11/27 0738) Pulse Rate:  [56-81] 78 (11/27 1006) Resp:  [8-24] 14 (11/27 1006) BP: (116-182)/(48-141) 127/59 (11/27 1000) SpO2:  [93 %-100 %] 95 % (11/27 1006) Weight:  [128 lb 12 oz (58.4 kg)] 128 lb 12 oz (58.4 kg) (11/27 0733)  PHYSICAL EXAMINATION: General: Thin elderly male in no acute distress, up to chair at bedside HEENT: MM pink/moist, good dentition PSY; calm/appropriate Neuro: AAO x4, MAE CV: s1s2 rrr, no m/r/g PULM: even/non-labored, lungs bilaterally clear, 2 L O2 TD:VVOH, non-tender, bsx4 active, abdominal dressing clean and dry Extremities: warm/dry, no edema  Skin: no rashes or lesions    Recent Labs  Lab 10/13/17 2143 10/15/17 0321 10/16/17 0319  NA 136 137 140  K 4.0 4.2 4.0  CL 109 107 108  CO2 20* 22 25  BUN 25* 26* 28*  CREATININE 1.48* 1.44* 1.48*  GLUCOSE 113* 116* 118*   Recent Labs  Lab 10/13/17 2143 10/15/17 0321 10/16/17 0319  HGB 12.5* 12.4* 11.3*  HCT 38.7* 38.1* 35.4*  WBC 10.0  16.7* 14.3*  PLT 167 162 151   Dg Chest Port 1 View  Result Date: 10/15/2017 CLINICAL DATA:  Pulmonary edema, feeling better today, history coronary artery disease, CML, CHF, ischemic cardiomyopathy, former smoker EXAM: PORTABLE CHEST 1 VIEW COMPARISON:  Portable exam 1132 hours compared to 10/14/2017 FINDINGS: Nasogastric tube tip projects at GE junction, with proximal side-port inadequately visualized. Surgical clips at gastroesophageal junction. Enlargement of cardiac silhouette. Mediastinal contours and pulmonary vascularity normal. BILATERAL pulmonary infiltrates question pulmonary edema improved since previous exam. No pleural effusion or pneumothorax. Bowel interposition between liver and diaphragm. IMPRESSION: Tip of nasogastric tube projects at the GE junction. Slightly improved pulmonary edema. Electronically Signed   By: Lavonia Dana M.D.   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  Appears to have received a total of  2 L of IVF intraoperatively.  This morning upon extubation was noted to be in hypoxemic respiratory failure, most likely due to fluid resuscitation in the setting of systolic heart failure with concurrent acute kidney injury.  Patient improved with diuretics and  reduction of IV fluids.  Acute hypoxemic respiratory failure due to volume overload Mild Obstructive Lung Disease, Emphysema noted on CT P: Wean O2 to off for sats 90-95% IVF @ 50 ml/hr until taking PO's Duoneb  Follow intermittent CXR  Pulmonary hygiene-IS, mobilize  Moderate Aortic Regurgitation - noted on ECHO in 04/2017 sCHF - LVEF 73-53% Diastolic Dysfunction - grade 2 P: Repeat ECHO as above  Control afterload  Negative to even balance  Acute on chronic kidney disease - resolved P: Trend BMP / urinary output Replace electrolytes as indicated Avoid nephrotoxic agents, ensure adequate renal perfusion  PCCM will be available as needed.  Please call if new needs arise.  Noe Gens, NP-C [facility] Pgr: [phone] or if no answer [phone] 10/16/2017, 10:17 AM   STAFF NOTE: I, Merrie Roof, MD FACP have personally reviewed patient's available data, including medical history, events of note, physical examination and test results as part of my evaluation. I have discussed with resident/NP and other care providers such as pharmacist, RN and RRT. In addition, I personally evaluated patient and elicited key findings of: awake, alert, on 2 liters from 5 from 15, jvd down, lungs clear anterior, abdo soft, no edema, repeat echo reviewed, with increased diast dz, ef remains same, no new worsening valvular dz, lasix on hold, crt flat, NPO per surgery, liklely will ghet to RA, will sign off, call if needed  Lavon Paganini. Titus Mould, MD, [place] Pgr: [facility] 10/16/2017 11:14 AM

## 2017-10-16 NOTE — Progress Notes (Signed)
  Progress Note: General Surgery Service   Assessment/Plan: Patient Active Problem List   Diagnosis Date Noted  . 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 Bilateral rales   . 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 Cough 08/17/2016  . Carotid stenosis 09/17/2015  . Right carotid bruit 12/14/2014  . Unstable angina ([place]) 10/10/2013  . Ischemic cardiomyopathy   . Hyperlipidemia   . Chronic systolic CHF (congestive heart failure) ([place]) 10/03/2013  . CAD (coronary artery disease) 10/03/2013  . Acute systolic heart failure ([place]) 08/11/2013  . Chronic kidney disease (CKD), stage III (moderate) ([facility]) 08/11/2013  . Acute respiratory failure with hypoxia ([place]) 08/09/2013  . Acute pulmonary edema ([place]) 08/09/2013  . Small bowel obstruction ([place]) 08/09/2013   s/p Procedure(s): EXPLORATORY LAPAROTOMY for SMALL BOWEL OBSTRUCTION, cecopexy 10/14/2017 -remove NG tube -remove foley -transfer to tele in afternoon -out of bed -ice chips ok, await return of bowel function   LOS: 2 days  Chief Complaint/Subjective: Pain much improved, slight nausea a few times yesterday  Objective: Vital signs in last 24 hours: Temp:  [97.4 F (36.3 C)-97.8 F (36.6 C)] 97.8 F (36.6 C) (11/27 0738) Pulse Rate:  [56-81] 66 (11/27 0800) Resp:  [8-24] 10 (11/27 0800) BP: (116-182)/(48-141) 128/48 (11/27 0800) SpO2:  [93 %-100 %] 97 % (11/27 0800) Weight:  [58.4 kg (128 lb 12 oz)] 58.4 kg (128 lb 12 oz) (11/27 0733) Last BM Date: (PTA)  Intake/Output from previous day: 11/26 0701 - 11/27 0700 In: 1130.3 [I.V.:1130.3] Out: 1050 [Urine:1050] Intake/Output this shift: Total I/O In: 100 [I.V.:100] Out: 25 [Emesis/NG output:25]   Cardiovascular: paced rhythm regular rate  Abd: soft, ND, appropriately tender to palpation, dressing with small amount seep through  Extremities: no edema  Neuro: AOx4  Lab Results: CBC  Recent Labs    10/15/17 0321 10/16/17 0319  WBC 16.7* 14.3*  HGB 12.4* 11.3*  HCT 38.1* 35.4*    PLT 162 151   BMET Recent Labs    10/15/17 0321 10/16/17 0319  NA 137 140  K 4.2 4.0  CL 107 108  CO2 22 25  GLUCOSE 116* 118*  BUN 26* 28*  CREATININE 1.44* 1.48*  CALCIUM 8.1* 8.2*   PT/INR No results for input(s): LABPROT, INR in the last 72 hours. ABG No results for input(s): PHART, HCO3 in the last 72 hours.  Invalid input(s): PCO2, PO2  Studies/Results:  Anti-infectives: Anti-infectives (From admission, onward)   Start     Dose/Rate Route Frequency Ordered Stop   10/14/17 0415  cefOXitin (MEFOXIN) 2 g in dextrose 5 % 50 mL IVPB     2 g 100 mL/hr over 30 Minutes Intravenous  Once 10/14/17 0412 10/14/17 0515      Medications: Scheduled Meds: . enoxaparin (LOVENOX) injection  40 mg Subcutaneous Q24H  . hydrALAZINE  10 mg Intravenous Q6H  . ipratropium-albuterol  3 mL Nebulization TID  . metoprolol tartrate  5 mg Intravenous Q6H  . pantoprazole (PROTONIX) IV  40 mg Intravenous QHS   Continuous Infusions: . dextrose 5% lactated ringers with KCl 20 mEq/L 50 mL/hr at 10/16/17 0800   PRN Meds:.HYDROmorphone (DILAUDID) injection, menthol-cetylpyridinium, ondansetron **OR** ondansetron (ZOFRAN) IV, phenol  Mickeal Skinner, MD Pg# [phone] [facility]

## 2017-10-16 NOTE — Progress Notes (Signed)
Transferred patient to room 1513 via wheelchair.Report given to RN.

## 2017-10-16 NOTE — Progress Notes (Signed)
Pt transferred from ICU via wheelchair. Pt is in the bed; rating pain at a 8 out of 10 on a 0-10 pain scale. Pts assessment is unchanged from earlier assessment. Will continue to monitor

## 2017-10-16 NOTE — Progress Notes (Signed)
Progress Note  Patient Name: [name] [name] Date of Encounter: 10/16/2017  Primary Cardiologist: Dr. Aundra Dubin   Subjective   Feeling well. No chest pain, sob or palpitations.   Inpatient Medications    Scheduled Meds: . enoxaparin (LOVENOX) injection  40 mg Subcutaneous Q24H  . hydrALAZINE  10 mg Intravenous Q6H  . ipratropium-albuterol  3 mL Nebulization TID  . metoprolol tartrate  5 mg Intravenous Q6H  . pantoprazole (PROTONIX) IV  40 mg Intravenous QHS   Continuous Infusions: . dextrose 5% lactated ringers with KCl 20 mEq/L 50 mL/hr at 10/16/17 1000   PRN Meds: HYDROmorphone (DILAUDID) injection, menthol-cetylpyridinium, ondansetron **OR** ondansetron (ZOFRAN) IV, phenol   Vital Signs    Vitals:   10/16/17 1006 10/16/17 1243 10/16/17 1400 10/16/17 1446  BP:  (!) 154/58 (!) 151/71   Pulse: 78 69 81   Resp: 14   14  Temp:  98 F (36.7 C) 97.7 F (36.5 C)   TempSrc:  Oral Oral   SpO2: 95% 92% 96% 95%  Weight:      Height:        Intake/Output Summary (Last 24 hours) at 10/16/2017 1517 Last data filed at 10/16/2017 1000 Gross per 24 hour  Intake 1330.25 ml  Output 1325 ml  Net 5.25 ml   Filed Weights   10/14/17 1300 10/16/17 0733  Weight: 138 lb 0.1 oz (62.6 kg) 128 lb 12 oz (58.4 kg)    Telemetry    SR with PVCs rare - Personally Reviewed  ECG    N/a  Physical Exam   GEN: No acute distress.   Neck: No JVD Cardiac: RRR, no murmurs, rubs, or gallops.  Respiratory: Clear to auscultation bilaterally. GI: Soft, nontender, non-distended  MS: No edema; No deformity. Neuro:  Nonfocal  Psych: Normal affect   Labs    Chemistry Recent Labs  Lab 10/13/17 2143 10/15/17 0321 10/16/17 0319  NA 136 137 140  K 4.0 4.2 4.0  CL 109 107 108  CO2 20* 22 25  GLUCOSE 113* 116* 118*  BUN 25* 26* 28*  CREATININE 1.48* 1.44* 1.48*  CALCIUM 8.5* 8.1* 8.2*  PROT 7.1  --   --   ALBUMIN 3.9  --   --   AST 45*  --   --   ALT 47  --   --   ALKPHOS 116  --    --   BILITOT 0.4  --   --   GFRNONAA 42* 43* 42*  GFRAA 48* 50* 48*  ANIONGAP 7 8 7      Hematology Recent Labs  Lab 10/13/17 2143 10/15/17 0321 10/16/17 0319  WBC 10.0 16.7* 14.3*  RBC 4.36 4.30 3.94*  HGB 12.5* 12.4* 11.3*  HCT 38.7* 38.1* 35.4*  MCV 88.8 88.6 89.8  MCH 28.7 28.8 28.7  MCHC 32.3 32.5 31.9  RDW 15.8* 15.8* 15.9*  PLT 167 162 151    Cardiac Enzymes Recent Labs  Lab 10/13/17 2143 10/14/17 0026  TROPONINI 0.03* 0.03*    Radiology    Dg Chest Port 1 View  Result Date: 10/15/2017 CLINICAL DATA:  Pulmonary edema, feeling better today, history coronary artery disease, CML, CHF, ischemic cardiomyopathy, former smoker EXAM: PORTABLE CHEST 1 VIEW COMPARISON:  Portable exam 1132 hours compared to 10/14/2017 FINDINGS: Nasogastric tube tip projects at GE junction, with proximal side-port inadequately visualized. Surgical clips at gastroesophageal junction. Enlargement of cardiac silhouette. Mediastinal contours and pulmonary vascularity normal. BILATERAL pulmonary infiltrates question pulmonary edema improved since previous exam.  No pleural effusion or pneumothorax. Bowel interposition between liver and diaphragm. IMPRESSION: Tip of nasogastric tube projects at the GE junction. Slightly improved pulmonary edema. Electronically Signed   By: Lavonia Dana M.D.   On: 10/15/2017 12:12    Cardiac Studies    Echo 10/15/17 Study Conclusions  - Left ventricle: The cavity size was mildly dilated. Systolic   function was moderately to severely reduced. The estimated   ejection fraction was in the range of 30% to 35%. Diffuse   hypokinesis. Akinesis of the mid-apicalanteroseptal and apical   myocardium. Features are consistent with a pseudonormal left   ventricular filling pattern, with concomitant abnormal relaxation   and increased filling pressure (grade 2 diastolic dysfunction).   Doppler parameters are consistent with high ventricular filling   pressure. - Aortic  valve: Transvalvular velocity was within the normal range.   There was no stenosis. There was mild regurgitation. - Mitral valve: Calcified annulus. Transvalvular velocity was   within the normal range. There was no evidence for stenosis.   There was mild regurgitation. - Left atrium: The atrium was mildly dilated. - Right ventricle: The cavity size was normal. Wall thickness was   normal. Systolic function was normal. - Tricuspid valve: There was trivial regurgitation. - Pulmonary arteries: Systolic pressure was within the normal   range. PA peak pressure: 33 mm Hg (S).  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 Appears to have received a total of 2 L of IVF intraoperatively.   Assessment & Plan    1. Troponin elevation  - Minimal with flat trend.  ECho shows stable LVEF 30-35% and grade 2 DD. Asymptomatic. Demand in setting of acute illness.   2. Acute on chronic diastolic CHF - From fluid resuscitation during surgery. CXR shows improvement in pulmonary edema - Held lasix for past two days. Scr stable  3. Acute hypoxemic respiratory failure due to volume overload - Per PCCM  4. Acute on chronic kidney disease - Resolved  5. Hypertension  - Continue IV BB. add hydralazine 10 mg iv Q6H as he is NPO. Resume home meds when able to take by mouth.   6. CAD: S/p DES to mLAD with overlapping Promus stentsin 11/14.Of note, there was residual 50% stenosis just beyond the stent margin.  - Continue ASA and statin.   Signed, Ena Dawley, MD  10/16/2017, 3:17 PM     The patient was seen, examined and discussed with Bhagat,Bhavinkumar PA-C and I agree with the above.   No ischemic  workup needed, troponin minimally elevated in the settings of an acute illness, surgery and CHF, mild CHF, improving, start lasix 20 mg po daily. Continue iv metoprolol, restart asa 81 mg po daily and crestor 5 mg po daily. Will sign off. Call with questions.   Ena Dawley, MD 10/16/2017

## 2017-10-16 NOTE — Care Management Note (Signed)
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  This morning when extubated he was noted to have an increasing oxygen requirement, initially 2 L and then increased eventually to a facemask 50% to maintain SPO2 greater than 90%.    Action/Plan: Date: October 16, 2017 Velva Harman, BSN, [place], [place] Chart and notes review for patient progress and needs. Will follow for case management and discharge needs. Next review date: [id number]  Expected Discharge Date:  10/18/17               Expected Discharge Plan:  Home/Self Care  In-House Referral:     Discharge planning Services  CM Consult  Post Acute Care Choice:    Choice offered to:     DME Arranged:    DME Agency:     HH Arranged:    HH Agency:     Status of Service:  In process, will continue to follow  If discussed at Long Length of Stay Meetings, dates discussed:    Additional Comments:  Leeroy Cha, RN 10/16/2017, 8:26 AM

## 2017-10-17 MED ORDER — HYDROMORPHONE HCL 1 MG/ML IJ SOLN
0.5000 mg | INTRAMUSCULAR | Status: DC | PRN
  Administered 2017-10-18 (×2): 0.5 mg via INTRAVENOUS
  Filled 2017-10-17 (×2): qty 0.5

## 2017-10-17 MED ORDER — IPRATROPIUM-ALBUTEROL 0.5-2.5 (3) MG/3ML IN SOLN: 3.0000 mL | Freq: Four times a day (QID) | RESPIRATORY_TRACT | Status: DC | PRN

## 2017-10-17 NOTE — Progress Notes (Signed)
3 Days Post-Op    CC: Abdominal pain  Subjective: Patient looks good this morning.  Tolerating clears but fills up easily.  No flatus so far.  Abdomen still extremely tender.  Objective: Vital signs in last 24 hours: Temp:  [97.7 F (36.5 C)-98.1 F (36.7 C)] 98.1 F (36.7 C) (11/28 0600) Pulse Rate:  [69-82] 82 (11/28 0600) Resp:  [12-15] 12 (11/28 0600) BP: (115-158)/(55-79) 158/79 (11/28 0600) SpO2:  [92 %-98 %] 92 % (11/28 0752) Weight:  [58.2 kg (128 lb 4.9 oz)] 58.2 kg (128 lb 4.9 oz) (11/28 0500) Last BM Date: 10/12/17 200 input 670 urine recorded. Afebrile, VSS creatinine stable WBC up some   Intake/Output from previous day: 11/27 0701 - 11/28 0700 In: 200 [I.V.:200] Out: 695 [Urine:670; Emesis/NG output:25] Intake/Output this shift: Total I/O In: 120 [P.O.:120] Out: 125 [Urine:125]  General appearance: alert, cooperative and no distress Resp: clear to auscultation bilaterally GI: Sore/tender.  Dressing has not been changed since I used silver nitrate and Gelfoam.  Site was cleaned up.  We will start wet-to-dry dressings bowel sounds are still hypoactive.  Lab Results:  Recent Labs    10/15/17 0321 10/16/17 0319  WBC 16.7* 14.3*  HGB 12.4* 11.3*  HCT 38.1* 35.4*  PLT 162 151    BMET Recent Labs    10/15/17 0321 10/16/17 0319  NA 137 140  K 4.2 4.0  CL 107 108  CO2 22 25  GLUCOSE 116* 118*  BUN 26* 28*  CREATININE 1.44* 1.48*  CALCIUM 8.1* 8.2*   PT/INR No results for input(s): LABPROT, INR in the last 72 hours.  Recent Labs  Lab 10/13/17 2143  AST 45*  ALT 47  ALKPHOS 116  BILITOT 0.4  PROT 7.1  ALBUMIN 3.9     Lipase     Component Value Date/Time   LIPASE 35 10/13/2017 2143     Medications: . aspirin  81 mg Oral Daily  . enoxaparin (LOVENOX) injection  40 mg Subcutaneous Q24H  . furosemide  20 mg Oral Daily  . hydrALAZINE  10 mg Intravenous Q6H  . metoprolol tartrate  5 mg Intravenous Q6H  . pantoprazole (PROTONIX) IV   40 mg Intravenous QHS  . rosuvastatin  5 mg Oral Daily   . dextrose 5% lactated ringers with KCl 20 mEq/L 50 mL/hr at 10/16/17 1000   Anti-infectives (From admission, onward)   Start     Dose/Rate Route Frequency Ordered Stop   10/14/17 0415  cefOXitin (MEFOXIN) 2 g in dextrose 5 % 50 mL IVPB     2 g 100 mL/hr over 30 Minutes Intravenous  Once 10/14/17 9381 10/14/17 0515      Assessment/Plan Midgut volvulus Exploratory laparotomy for small bowel obstruction, cecopexy 10/14/17 Dr. Excell Seltzer POD 3 History of hiatal hernia repair cholecystectomy and partial colectomy 2014/small bowel perforation/small bowel resection 11/2012  Acute hypoxemic respiratory failure due to volume overload Mild Obstructive Lung Disease, Emphysema noted on CT  - resolved   History of carotid artery occlusion CE 09/17/15 Hx chronic systolic congestive heart failure (LVH, EF 30-35% akinesis, multiple areas, grade 1 diastolic dysfunction, Mild to moderated AR) Hx chronic kidney disease-stage III  - creatinine 1.48 -1.44 range CAD with LAD stent - Cardiology note 10/15/17 - stable, no current issues Chronic myelocytic leukemia - on oral chemotherapy.    FEN: IV fluids/clears ID: Cefoxitin preop dose only DVT: Lovenox Foley: out Follow-up: Dr. Excell Seltzer  Plan: Continue clear liquids and IV fluids for now.  OT  and PT to help mobilize.     LOS: 3 days    [name] [name] 10/17/2017 [phone]

## 2017-10-17 NOTE — Care Management Important Message (Signed)
Important Message  Patient Details  Name: Viet Kemmerer MRN: [id number] Date of Birth: Nov 25, 1929   Medicare Important Message Given:  Yes    Kerin Salen 10/17/2017, 10:13 AMImportant Message  Patient Details  Name: Rebekah Sprinkle MRN: [id number] Date of Birth: November 29, 1929   Medicare Important Message Given:  Yes    Kerin Salen 10/17/2017, 10:13 AM

## 2017-10-17 NOTE — Progress Notes (Signed)
  Progress Note: General Surgery Service   Assessment/Plan: Patient Active Problem List   Diagnosis Date Noted  . 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 Bilateral rales   . 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 Cough 08/17/2016  . Carotid stenosis 09/17/2015  . Right carotid bruit 12/14/2014  . Unstable angina ([place]) 10/10/2013  . Ischemic cardiomyopathy   . Hyperlipidemia   . Chronic systolic CHF (congestive heart failure) ([place]) 10/03/2013  . CAD (coronary artery disease) 10/03/2013  . Acute systolic heart failure ([place]) 08/11/2013  . Chronic kidney disease (CKD), stage III (moderate) (HCC) 08/11/2013  . Acute respiratory failure with hypoxia ([place]) 08/09/2013  . Pulmonary edema 08/09/2013  . Small bowel obstruction ([place]) 08/09/2013   s/p Procedure(s): EXPLORATORY LAPAROTOMY for SMALL BOWEL OBSTRUCTION, cecopexy 10/14/2017 -continue liquids, await return of bowel function -dressing changes -up to chair    LOS: 3 days  Chief Complaint/Subjective: Pain overnight, minimal nausea, feeling bloated, no flatus  Objective: Vital signs in last 24 hours: Temp:  [97.7 F (36.5 C)-98.1 F (36.7 C)] 98.1 F (36.7 C) (11/28 0600) Pulse Rate:  [69-82] 82 (11/28 0600) Resp:  [12-15] 12 (11/28 0600) BP: (115-158)/(55-79) 158/79 (11/28 0600) SpO2:  [92 %-98 %] 92 % (11/28 0752) Weight:  [58.2 kg (128 lb 4.9 oz)] 58.2 kg (128 lb 4.9 oz) (11/28 0500) Last BM Date: 10/12/17  Intake/Output from previous day: 11/27 0701 - 11/28 0700 In: 200 [I.V.:200] Out: 695 [Urine:670; Emesis/NG output:25] Intake/Output this shift: No intake/output data recorded.  Lungs: CTAB  Abd: soft, distended, appropriately tender, some ecchymosis around the area  Extremities: no edema  Neuro: AOx4  Lab Results: CBC  Recent Labs    10/15/17 0321 10/16/17 0319  WBC 16.7* 14.3*  HGB 12.4* 11.3*  HCT 38.1* 35.4*  PLT 162 151   BMET Recent Labs    10/15/17 0321 10/16/17 0319  NA 137 140  K 4.2 4.0  CL 107 108   CO2 22 25  GLUCOSE 116* 118*  BUN 26* 28*  CREATININE 1.44* 1.48*  CALCIUM 8.1* 8.2*   PT/INR No results for input(s): LABPROT, INR in the last 72 hours. ABG No results for input(s): PHART, HCO3 in the last 72 hours.  Invalid input(s): PCO2, PO2  Studies/Results:  Anti-infectives: Anti-infectives (From admission, onward)   Start     Dose/Rate Route Frequency Ordered Stop   10/14/17 0415  cefOXitin (MEFOXIN) 2 g in dextrose 5 % 50 mL IVPB     2 g 100 mL/hr over 30 Minutes Intravenous  Once 10/14/17 0412 10/14/17 0515      Medications: Scheduled Meds: . aspirin  81 mg Oral Daily  . enoxaparin (LOVENOX) injection  40 mg Subcutaneous Q24H  . furosemide  20 mg Oral Daily  . hydrALAZINE  10 mg Intravenous Q6H  . metoprolol tartrate  5 mg Intravenous Q6H  . pantoprazole (PROTONIX) IV  40 mg Intravenous QHS  . rosuvastatin  5 mg Oral Daily   Continuous Infusions: . dextrose 5% lactated ringers with KCl 20 mEq/L 50 mL/hr at 10/16/17 1000   PRN Meds:.HYDROmorphone (DILAUDID) injection, ipratropium-albuterol, menthol-cetylpyridinium, ondansetron **OR** ondansetron (ZOFRAN) IV, phenol  Mickeal Skinner, MD Pg# [phone] [facility]

## 2017-10-18 ENCOUNTER — Encounter (HOSPITAL_COMMUNITY): Payer: Self-pay | Admitting: General Surgery

## 2017-10-18 MED ORDER — TRAMADOL HCL 50 MG PO TABS: 50.0000 mg | ORAL_TABLET | Freq: Four times a day (QID) | ORAL | Status: DC | PRN

## 2017-10-18 MED ORDER — HYDROCODONE-ACETAMINOPHEN 5-325 MG PO TABS
1.0000 | ORAL_TABLET | ORAL | Status: DC | PRN
  Administered 2017-10-18: 1 via ORAL
  Filled 2017-10-18 (×2): qty 1

## 2017-10-18 NOTE — Progress Notes (Addendum)
  Progress Note: General Surgery Service   Assessment/Plan: Patient Active Problem List   Diagnosis Date Noted  . 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 Bilateral rales   . 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 Cough 08/17/2016  . Carotid stenosis 09/17/2015  . Right carotid bruit 12/14/2014  . Unstable angina ([place]) 10/10/2013  . Ischemic cardiomyopathy   . Hyperlipidemia   . Chronic systolic CHF (congestive heart failure) ([place]) 10/03/2013  . CAD (coronary artery disease) 10/03/2013  . Acute systolic heart failure ([place]) 08/11/2013  . Chronic kidney disease (CKD), stage III (moderate) (HCC) 08/11/2013  . Acute respiratory failure with hypoxia ([place]) 08/09/2013  . Pulmonary edema 08/09/2013  . Small bowel obstruction ([place]) 08/09/2013   s/p Procedure(s): EXPLORATORY LAPAROTOMY for SMALL BOWEL OBSTRUCTION, cecopexy 10/14/2017 -advance to soft diet -ambulate w assist -saline lock -oral pain medications, avoid strong IV narcotics    LOS: 4 days  Chief Complaint/Subjective: +flatus multiple times, some abdominal pain this morning, no nausea  Objective: Vital signs in last 24 hours: Temp:  [97.8 F (36.6 C)-98 F (36.7 C)] 97.8 F (36.6 C) (11/29 0418) Pulse Rate:  [68-83] 81 (11/29 0418) Resp:  [16] 16 (11/29 0418) BP: (103-136)/(63-72) 136/63 (11/29 0418) SpO2:  [93 %-97 %] 97 % (11/29 0418) Weight:  [60.4 kg (133 lb 2.5 oz)] 60.4 kg (133 lb 2.5 oz) (11/29 0418) Last BM Date: 10/12/17  Intake/Output from previous day: 11/28 0701 - 11/29 0700 In: 360 [P.O.:360] Out: 300 [Urine:300] Intake/Output this shift: No intake/output data recorded.  Lungs: CTAB  Cardiovascular: RRR  Abd: soft, ATTP, wound partially opened with clean base  Extremities: no edema  Neuro: AOx4  Lab Results: CBC  Recent Labs    10/16/17 0319  WBC 14.3*  HGB 11.3*  HCT 35.4*  PLT 151   BMET Recent Labs    10/16/17 0319  NA 140  K 4.0  CL 108  CO2 25  GLUCOSE 118*  BUN 28*  CREATININE 1.48*    CALCIUM 8.2*   PT/INR No results for input(s): LABPROT, INR in the last 72 hours. ABG No results for input(s): PHART, HCO3 in the last 72 hours.  Invalid input(s): PCO2, PO2  Studies/Results:  Anti-infectives: Anti-infectives (From admission, onward)   Start     Dose/Rate Route Frequency Ordered Stop   10/14/17 0415  cefOXitin (MEFOXIN) 2 g in dextrose 5 % 50 mL IVPB     2 g 100 mL/hr over 30 Minutes Intravenous  Once 10/14/17 0412 10/14/17 0515      Medications: Scheduled Meds: . aspirin  81 mg Oral Daily  . enoxaparin (LOVENOX) injection  40 mg Subcutaneous Q24H  . furosemide  20 mg Oral Daily  . hydrALAZINE  10 mg Intravenous Q6H  . metoprolol tartrate  5 mg Intravenous Q6H  . pantoprazole (PROTONIX) IV  40 mg Intravenous QHS  . rosuvastatin  5 mg Oral Daily   Continuous Infusions: PRN Meds:.HYDROcodone-acetaminophen, HYDROmorphone (DILAUDID) injection, ipratropium-albuterol, menthol-cetylpyridinium, ondansetron **OR** ondansetron (ZOFRAN) IV, phenol, traMADol  Mickeal Skinner, MD Pg# [phone] [facility]

## 2017-10-18 NOTE — Discharge Instructions (Signed)
[facility]      [facility] °[phone] ° °OPEN ABDOMINAL SURGERY: POST OP INSTRUCTIONS ° °Always review your discharge instruction sheet given to you by the facility where your surgery was performed. ° °IF YOU HAVE DISABILITY OR FAMILY LEAVE FORMS, YOU MUST BRING THEM TO THE OFFICE FOR PROCESSING.  PLEASE DO NOT GIVE THEM TO YOUR DOCTOR. ° °1. A prescription for pain medication may be given to you upon discharge.  Take your pain medication as prescribed, if needed.  If narcotic pain medicine is not needed, then you may take acetaminophen (Tylenol) or ibuprofen (Advil) as needed. °2. Take your usually prescribed medications unless otherwise directed. °3. If you need a refill on your pain medication, please contact your pharmacy. They will contact our office to request authorization.  Prescriptions will not be filled after 5pm or on week-ends. °4. You should follow a light diet the first few days after arrival home, such as soup and crackers, pudding, etc.unless your doctor has advised otherwise. A high-fiber, low fat diet can be resumed as tolerated.   Be sure to include lots of fluids daily. Most patients will experience some swelling and bruising on the chest and neck area.  Ice packs will help.  Swelling and bruising can take several days to resolve °5. Most patients will experience some swelling and bruising in the area of the incision. Ice pack will help. Swelling and bruising can take several days to resolve..  °6. It is common to experience some constipation if taking pain medication after surgery.  Increasing fluid intake and taking a stool softener will usually help or prevent this problem from occurring.  A mild laxative (Milk of Magnesia or Miralax) should be taken according to package directions if there are no bowel movements after 48 hours. °7.  You may have steri-strips (small skin tapes) in place directly over the incision.  These strips should be left on the skin for 7-10 days.  If your  surgeon used skin glue on the incision, you may shower in 24 hours.  The glue will flake off over the next 2-3 weeks.  Any sutures or staples will be removed at the office during your follow-up visit. You may find that a light gauze bandage over your incision may keep your staples from being rubbed or pulled. You may shower and replace the bandage daily. °8. ACTIVITIES:  You may resume regular (light) daily activities beginning the next day--such as daily self-care, walking, climbing stairs--gradually increasing activities as tolerated.  You may have sexual intercourse when it is comfortable.  Refrain from any heavy lifting or straining until approved by your doctor. °a. You may drive when you no longer are taking prescription pain medication, you can comfortably wear a seatbelt, and you can safely maneuver your car and apply brakes °b. Return to Work: ___________________________________ °9. You should see your doctor in the office for a follow-up appointment approximately two weeks after your surgery.  Make sure that you call for this appointment within a day or two after you arrive home to insure a convenient appointment time. °OTHER INSTRUCTIONS:  °_____________________________________________________________ °_____________________________________________________________ ° °WHEN TO CALL YOUR DOCTOR: °1. Fever over 101.0 °2. Inability to urinate °3. Nausea and/or vomiting °4. Extreme swelling or bruising °5. Continued bleeding from incision. °6. Increased pain, redness, or drainage from the incision. °7. Difficulty swallowing or breathing °8. Muscle cramping or spasms. °9. Numbness or tingling in hands or feet or around lips. ° °The clinic staff is available to   answer your questions during regular business hours.  Please don’t hesitate to call and ask to speak to one of the nurses if you have concerns. ° °For further questions, please visit [email or web address] ° °Mechanical Wound Debridement °Mechanical  wound debridement is a treatment to remove dead tissue from a wound. This helps the wound heal. The treatment involves cleaning the wound (irrigation) and using a pad or gauze (dressing) to remove dead tissue and debris from the wound. There are different types of mechanical wound debridement. °Depending on the wound, you may need to repeat this procedure or change to another form of debridement as your wound starts to heal. °Tell a health care provider about: °· Any allergies you have. °· All medicines you are taking, including vitamins, herbs, eye drops, creams, and over-the-counter medicines. °· Any blood disorders you have. °· Any medical conditions you have, including any conditions that: °? Cause a significant decrease in blood circulation to the part of the body where the wound is, such as peripheral vascular disease. °? Compromise your defense (immune) system or white blood count. °· Any surgeries you have had. °· Whether you are pregnant or may be pregnant. °What are the risks? °Generally, this is a safe procedure. However, problems may occur, including: °· Infection. °· Bleeding. °· Damage to healthy tissue in and around your wound. °· Soreness or pain. °· Failure of the wound to heal. °· Scarring. ° °What happens before the procedure? °You may be given antibiotic medicine to help prevent infection. °What happens during the procedure? °· Your health care provider may apply a numbing medicine (topical anesthetic) to the wound. °· Your health care provider will irrigate your wound with a germ-free (sterile), salt-water (saline) solution. This removes debris, bacteria, and dead tissue. °· Depending on what type of mechanical wound debridement you are having, your health care provider may do one of the following: °? Put a dressing on your wound. You may have dry gauze pad placed into the wound. Your health care provider will remove the gauze after the wound is dry. Any dead tissue and debris that has dried  into the gauze will be lifted out of the wound (wet-to-dry debridement). °? Use a type of pad (monofilament fiber debridement pad). This pad has a fluffy surface on one side that picks up dead tissue and debris from your wound. Your health care provider wets the pad and wipes it over your wound for several minutes. °? Irrigate your wound with a pressurized stream of solution such as saline or water. °· Once your health care provider is finished, he or she may apply a light dressing to your wound. °The procedure may vary among health care providers and hospitals. °What happens after the procedure? °· You may receive medicine for pain. °· You will continue to receive antibiotic medicine if it was started before your procedure. °This information is not intended to replace advice given to you by your health care provider. Make sure you discuss any questions you have with your health care provider. °Document Released: 07/28/2015 Document Revised: 04/13/2016 Document Reviewed: 03/17/2015 °Elsevier Interactive Patient Education © 2018 [employer] ° ° ° ° °

## 2017-10-18 NOTE — Progress Notes (Signed)
Patient had 2 small bowel movements.

## 2017-10-18 NOTE — Evaluation (Signed)
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 S/P explap, cecopexy 10/14/17. Hx of CHF, CKD, CML, CAD, hiatal hernia repair, colectomy.     Clinical Impression  On eval, pt was Min guard assist for mobility. He walked ~100 feet while holding on to IV pole and hallway handrail. He declined RW use during session. Pt presents with general weakness, decreased activity tolerance, and impaired gait and balance. He rated pain 5/10 during session. Would recommend RW use for safe ambulation until he returns to his baseline. Pt reported his wife is scheduled for a surgical procedure on 10/23/17. He may benefit from a home health aide to assist with ADLs since he will have limited assist available in the coming days.     Follow Up Recommendations Home health PT;Supervision - Intermittent; Home Health Aide    Equipment Recommendations  None recommended by PT(pt stated he has access to a RW at home)    Recommendations for Other Services       Precautions / Restrictions Precautions Precautions: Fall Restrictions Weight Bearing Restrictions: No      Mobility  Bed Mobility Overal bed mobility: Needs Assistance Bed Mobility: Supine to Sit;Sit to Supine     Supine to sit: Min guard;HOB elevated Sit to supine: Min guard;HOB elevated   General bed mobility comments: close guard for safety. Increased time. Pt relied on bedrail.   Transfers Overall transfer level: Needs assistance Equipment used: None Transfers: Sit to/from Stand Sit to Stand: Min guard         General transfer comment: close guard for safety.   Ambulation/Gait Ambulation/Gait assistance: Min guard Ambulation Distance (Feet): 100 Feet Assistive device: (IV pole and hallway handrail) Gait Pattern/deviations: Step-through pattern;Decreased stride length     General Gait  Details: Unsteady. Pt required support of IV pole and hallway handrail. He declined RW use.   Stairs            Wheelchair Mobility    Modified Rankin (Stroke Patients Only)       Balance Overall balance assessment: Needs assistance                                           Pertinent Vitals/Pain Pain Assessment: 0-10 Pain Score: 5  Pain Location: abdomen Pain Descriptors / Indicators: Grimacing;Operative site guarding Pain Intervention(s): Monitored during session;Repositioned    Home Living Family/patient expects to be discharged to:: Private residence Living Arrangements: Spouse/significant other Available Help at Discharge: Family Type of Home: House Home Access: Stairs to enter Entrance Stairs-Rails: Right Entrance Stairs-Number of Steps: 3 Home Layout: Able to live on main level with bedroom/bathroom;Multi-level Home Equipment: Walker - 2 wheels;Bedside commode      Prior Function Level of Independence: Independent               Hand Dominance        Extremity/Trunk Assessment   Upper Extremity Assessment Upper Extremity Assessment: Generalized weakness    Lower Extremity Assessment Lower Extremity Assessment: Generalized weakness    Cervical / Trunk Assessment Cervical / Trunk Assessment: Normal  Communication   Communication: No difficulties  Cognition Arousal/Alertness: Awake/alert Behavior During Therapy: WFL for tasks assessed/performed Overall Cognitive Status: Within Functional Limits for tasks assessed  General Comments      Exercises     Assessment/Plan    PT Assessment Patient needs continued PT services  PT Problem List Decreased strength;Decreased balance;Decreased mobility;Decreased activity tolerance;Pain;Decreased knowledge of use of DME       PT Treatment Interventions DME instruction;Gait training;Functional mobility training;Therapeutic  activities;Balance training;Patient/family education;Therapeutic exercise    PT Goals (Current goals can be found in the Care Plan section)  Acute Rehab PT Goals Patient Stated Goal: less pain. regain PLOF.  PT Goal Formulation: With patient Time For Goal Achievement: 11/01/17 Potential to Achieve Goals: Good    Frequency Min 3X/week   Barriers to discharge        Co-evaluation               AM-PAC PT "6 Clicks" Daily Activity  Outcome Measure Difficulty turning over in bed (including adjusting bedclothes, sheets and blankets)?: A Little Difficulty moving from lying on back to sitting on the side of the bed? : A Little Difficulty sitting down on and standing up from a chair with arms (e.g., wheelchair, bedside commode, etc,.)?: A Little Help needed moving to and from a bed to chair (including a wheelchair)?: A Little Help needed walking in hospital room?: A Little Help needed climbing 3-5 steps with a railing? : A Little 6 Click Score: 18    End of Session   Activity Tolerance: Patient tolerated treatment well Patient left: in bed;with call bell/phone within reach   PT Visit Diagnosis: Muscle weakness (generalized) (M62.81);Difficulty in walking, not elsewhere classified (R26.2);Pain Pain - part of body: (abdomen)    Time: 7628-3151 PT Time Calculation (min) (ACUTE ONLY): 20 min   Charges:   PT Evaluation $PT Eval Moderate Complexity: 1 Mod     PT G Codes:          Weston Anna, MPT Pager: [phone]

## 2017-10-18 NOTE — Progress Notes (Signed)
Patient ambulated in hall, with oxygen saturation 90 percent at the lowest on room air.

## 2017-10-18 NOTE — Evaluation (Signed)
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 S/P explap, cecopexy 10/14/17. Hx of CHF, CKD, CML, CAD, hiatal hernia repair, colectomy.    Clinical Impression   Pt admitted with midgut volvulus . Pt currently with functional limitations due to the deficits listed below (see OT Problem List). Pt will benefit from skilled OT to increase their safety and independence with ADL and functional mobility for ADL to facilitate discharge to venue listed below.      Follow Up Recommendations  No OT follow up    Equipment Recommendations  None recommended by OT       Precautions / Restrictions Precautions Precautions: Fall Restrictions Weight Bearing Restrictions: No      Mobility Bed Mobility Overal bed mobility: Needs Assistance Bed Mobility: Supine to Sit;Sit to Supine     Supine to sit: Min guard;HOB elevated Sit to supine: Min guard;HOB elevated   General bed mobility comments: close guard for safety. Increased time. Pt relied on bedrail.   Transfers Overall transfer level: Needs assistance Equipment used: None Transfers: Sit to/from Stand Sit to Stand: Min guard         General transfer comment: close guard for safety.     Balance Overall balance assessment: Needs assistance                                         ADL either performed or assessed with clinical judgement   ADL Overall ADL's : Needs assistance/impaired Eating/Feeding: Set up;Sitting   Grooming: Set up;Sitting   Upper Body Bathing: Set up;Sitting   Lower Body Bathing: Minimal assistance;Sit to/from stand;Cueing for sequencing;Cueing for safety       Lower Body Dressing: Set up   Toilet Transfer: Minimal assistance   Toileting- Clothing Manipulation and Hygiene: Minimal assistance;Sit to/from stand;Cueing for safety        Functional mobility during ADLs: Minimal assistance General ADL Comments: family will A as needed     Vision Patient Visual Report: No change from baseline              Pertinent Vitals/Pain Pain Assessment: 0-10 Pain Score: 3  Pain Location: abdomen Pain Descriptors / Indicators: Grimacing;Operative site guarding Pain Intervention(s): Limited activity within patient's tolerance;Repositioned     Hand Dominance     Extremity/Trunk Assessment Upper Extremity Assessment Upper Extremity Assessment: Generalized weakness   Lower Extremity Assessment Lower Extremity Assessment: Generalized weakness   Cervical / Trunk Assessment Cervical / Trunk Assessment: Normal   Communication Communication Communication: No difficulties   Cognition Arousal/Alertness: Awake/alert Behavior During Therapy: WFL for tasks assessed/performed Overall Cognitive Status: Within Functional Limits for tasks assessed                                                Home Living Family/patient expects to be discharged to:: Private residence Living Arrangements: Spouse/significant other Available Help at Discharge: Family Type of Home: House Home Access: Stairs to enter Technical brewer of Steps: 3 Entrance Stairs-Rails: Right Home Layout: Able to live on main level with bedroom/bathroom;Multi-level Alternate Level Stairs-Number of Steps: 1 flight Alternate Level Stairs-Rails: Right  Home Equipment: [place] - 2 wheels;Bedside commode          Prior Functioning/Environment Level of Independence: Independent                 OT Problem List: Decreased strength;Decreased activity tolerance;Impaired balance (sitting and/or standing);Decreased safety awareness      OT Treatment/Interventions: Self-care/ADL training;DME and/or AE instruction;Patient/family education    OT Goals(Current goals can be found in the care plan section) Acute Rehab OT  Goals Patient Stated Goal: less pain. regain PLOF.  OT Goal Formulation: With patient Time For Goal Achievement: 10/25/17  OT Frequency: Min 2X/week    AM-PAC PT "6 Clicks" Daily Activity     Outcome Measure Help from another person eating meals?: None Help from another person taking care of personal grooming?: None Help from another person toileting, which includes using toliet, bedpan, or urinal?: A Little Help from another person bathing (including washing, rinsing, drying)?: A Little Help from another person to put on and taking off regular upper body clothing?: None Help from another person to put on and taking off regular lower body clothing?: A Little 6 Click Score: 21   End of Session Nurse Communication: Mobility status  Activity Tolerance: Patient tolerated treatment well Patient left: in bed;with call bell/phone within reach;with nursing/sitter in room  OT Visit Diagnosis: Unsteadiness on feet (R26.81);Muscle weakness (generalized) (M62.81)                Time: 3094-0768 OT Time Calculation (min): 20 min Charges:  OT General Charges $OT Visit: 1 Visit OT Evaluation $OT Eval Moderate Complexity: 1 Mod G-Codes:     Kari Baars, [place] [phone]  Payton Mccallum D 10/18/2017, 12:56 PM

## 2017-10-19 LAB — BASIC METABOLIC PANEL
Anion gap: 7 (ref 5–15)
BUN: 32 mg/dL — ABNORMAL HIGH (ref 6–20)
CO2: 20 mmol/L — ABNORMAL LOW (ref 22–32)
Calcium: 8.1 mg/dL — ABNORMAL LOW (ref 8.9–10.3)
Chloride: 108 mmol/L (ref 101–111)
Creatinine, Ser: 1.54 mg/dL — ABNORMAL HIGH (ref 0.61–1.24)
GFR calc Af Amer: 46 mL/min — ABNORMAL LOW (ref >60)
GFR calc non Af Amer: 40 mL/min — ABNORMAL LOW (ref >60)
Glucose, Bld: 96 mg/dL (ref 65–99)
Potassium: 4.1 mmol/L (ref 3.5–5.1)
Sodium: 135 mmol/L (ref 135–145)

## 2017-10-19 LAB — CBC
HCT: 31.5 % — ABNORMAL LOW (ref 39.0–52.0)
Hemoglobin: 10.2 g/dL — ABNORMAL LOW (ref 13.0–17.0)
MCH: 29.1 pg (ref 26.0–34.0)
MCHC: 32.4 g/dL (ref 30.0–36.0)
MCV: 89.7 fL (ref 78.0–100.0)
Platelets: 158 10*3/uL (ref 150–400)
RBC: 3.51 MIL/uL — ABNORMAL LOW (ref 4.22–5.81)
RDW: 15.5 % (ref 11.5–15.5)
WBC: 6.7 10*3/uL (ref 4.0–10.5)

## 2017-10-19 MED ORDER — CALCIUM CARBONATE 600 MG PO TABS: ORAL_TABLET | ORAL | Status: DC

## 2017-10-19 MED ORDER — BOSULIF 100 MG PO TABS: ORAL_TABLET | ORAL | Status: DC

## 2017-10-19 MED ORDER — CARVEDILOL 6.25 MG PO TABS: 6.2500 mg | ORAL_TABLET | Freq: Two times a day (BID) | ORAL | Status: DC

## 2017-10-19 MED ORDER — HYDROCODONE-ACETAMINOPHEN 5-325 MG PO TABS: 1.0000 | ORAL_TABLET | ORAL | 0 refills | Status: DC | PRN

## 2017-10-19 MED ORDER — ACETAMINOPHEN 325 MG PO TABS: ORAL_TABLET | ORAL | 2 refills | Status: DC

## 2017-10-19 NOTE — Discharge Summary (Signed)
Physician Discharge Summary  [name] MRN: [id number] DOB/AGE: 1930/09/14 83 y.o.  Admit date: 10/13/2017 Discharge date: 10/19/2017  Admission Diagnoses: SBO Mild Obstructive Lung Disease, Emphysema noted on CT-resolved History of carotid artery occlusionCE 09/17/15 Hx chronic systolic congestive heart failure (LVH, EF 30-35% akinesis, multiple areas, grade 1 diastolic dysfunction, Mild to moderated AR) Hx chronic kidney disease-stage III- creatinine 1.48 -1.44 range up 11/30 to 1.54 CAD with LAD stent- Cardiology note 10/15/17 - stable, no current issues Chronic myelocytic leukemia- on oral chemotherapy.  Discharge Diagnoses:   Midgut volvulus Acute hypoxemic respiratory failure due to volume overload Mild Obstructive Lung Disease, Emphysema noted on CT-resolved History of carotid artery occlusionCE 09/17/15 Hx chronic systolic congestive heart failure (LVH, EF 30-35% akinesis, multiple areas, grade 1 diastolic dysfunction, Mild to moderated AR) Hx chronic kidney disease-stage III- creatinine 1.48 -1.44 range up 11/30 to 1.54 CAD with LAD stent- Cardiology note 10/15/17 - stable, no current issues Chronic myelocytic leukemia- on oral chemotherapy.  Active Problems:   Pulmonary edema   Midgut volvulus   Intestinal volvulus (HCC)   Bilateral rales   PROCEDURES: Exploratory laparotomy for small bowel obstruction, cecopexy 10/14/17 Dr. Marland Kitchen [facility] Course:  83 year old male with a remote history of open hiatal hernia repair and cholecystectomy and history of partial colectomy in 2014.  History of previous small bowel obstruction managed nonoperatively in 2016.  He has not had recent GI symptoms until this week.  3 days ago he developed the onset of crampy and aching mid abdominal pain.  This lasted for several hours and then went away.  2 days ago the pain recurred more severe associated with nausea.  He stopped eating and took  some Tylenol and after some hours it again improved.  He had a bowel movement at that time.  Then yesterday the pain recurred and was severe.  Radiating to his back.  Associated with nausea and small-volume emesis.  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  He was transferred to [facility].  At this point he is having moderate pain.  This is in his back and mid abdomen.  No fever or chills.  No urinary symptoms.  He feels his abdomen is slightly distended on the left side.  No chest pain or shortness of breath. CT scan in the ED showed evidence of small bowel obstruction, swirling of the mesentery consistent with a possible midgut volvulus.  He was taken to the operating room and this was found to be the case.  Patient underwent procedure as described above.  Tolerated procedure well was returned to the intensive care unit in satisfactory condition. Postoperative day he was stable.  He was on NG suction.  He had some problems with some bleeding in the midline we had to remove some staples and use some silver nitrate and Gelfoam to stop the skin bleeding.  He was seen by Dr. Titus Mould pulmonary critical care.  Was also seen and evaluated by Dr. Ottie Glazier cardiology.  He had minimal troponin elevation.  Bedside echo showed an EF of 30-35% with good contraction of the basal segments but akinesis of the mid apical anterior septal anterior segments.  He remained hemodynamically stable and no further cardiac or pulmonary interventions were needed.  He was mobilized and notices bowel function returned his diet was advanced.  We have restarted his Coreg and  his Cozaar for his blood pressure and congestive failure.  We did not restart Bosulif for his CLL.  His creatinine is between 1.4 and 1.5 during this admission.  He was seen by physical therapy and occupational therapy.  Home health is recommended  for his midline open wound.  And physical therapy at home has also been requested.  He is on twice daily dressing changes for the midline incision portion that is open.  He will return on 12/10 to get his staples out and has an appointment to follow-up with Dr. Excell Seltzer also.  I have recommended he see his primary care physician for medical management follow-up next week.  I recommended he return to see Dr. [name] his oncologist for follow-up and when to restart his CLL medications.  He is on a soft diet, and having bowel movements.  Condition on discharge: Improving.  CBC Latest Ref Rng & Units 10/19/2017 10/16/2017 10/15/2017  WBC 4.0 - 10.5 K/uL 6.7 14.3(H) 16.7(H)  Hemoglobin 13.0 - 17.0 g/dL 10.2(L) 11.3(L) 12.4(L)  Hematocrit 39.0 - 52.0 % 31.5(L) 35.4(L) 38.1(L)  Platelets 150 - 400 K/uL 158 151 162   CMP Latest Ref Rng & Units 10/19/2017 10/16/2017 10/15/2017  Glucose 65 - 99 mg/dL 96 118(H) 116(H)  BUN 6 - 20 mg/dL 32(H) 28(H) 26(H)  Creatinine 0.61 - 1.24 mg/dL 1.54(H) 1.48(H) 1.44(H)  Sodium 135 - 145 mmol/L 135 140 137  Potassium 3.5 - 5.1 mmol/L 4.1 4.0 4.2  Chloride 101 - 111 mmol/L 108 108 107  CO2 22 - 32 mmol/L 20(L) 25 22  Calcium 8.9 - 10.3 mg/dL 8.1(L) 8.2(L) 8.1(L)  Total Protein 6.5 - 8.1 g/dL - - -  Total Bilirubin 0.3 - 1.2 mg/dL - - -  Alkaline Phos 38 - 126 U/L - - -  AST 15 - 41 U/L - - -  ALT 17 - 63 U/L - - -    Disposition: 01-Home or Self Care   Allergies as of 10/19/2017      Reactions   Percocet [oxycodone-acetaminophen] Itching   Metronidazole Other (See Comments)   Insomnia and nervousness      Medication List    STOP taking these medications   FLUTTER Devi   NIACIN PO   pyridoxine 100 MG tablet Commonly known as:  B-6   rosuvastatin 5 MG tablet Commonly known as:  CRESTOR     TAKE these medications   acetaminophen 325 MG tablet Commonly known as:  TYLENOL You can take plain Tylenol 2 tablets every 4 hours for mild pain.  You can  take the prescribed pain medicine for pain not relieved by plain Tylenol.  DO NOT TAKE MORE THAN 4000 MG OF TYLENOL PER DAY.  IT CAN HARM YOUR LIVER.  TYLENOL (ACETAMINOPHEN) IS ALSO IN YOUR PRESCRIPTION PAIN MEDICATION.  YOU HAVE TO COUNT IT IN YOUR DAILY TOTAL.   aspirin EC 81 MG tablet Take 1 tablet (81 mg total) by mouth daily.   BOSULIF 100 MG tablet Generic drug:  bosutinib Call Dr. [name], make an appointment and discuss when to resume this medicine. What changed:    how much to take  how to take this  when to take this  additional instructions   calcium carbonate 600 MG Tabs tablet Commonly known as:  CALCIUM 600 Resume this as before next week when you are eating better. What changed:    how much to take  how to take this  when to take this  additional instructions  carvedilol 12.5 MG tablet Commonly known as:  COREG TAKE 1/2 TABLET TWICE DAILY WITH A MEAL   [employer] VITAMIN A PO Take 2,400 mcg by mouth daily.   cyanocobalamin 1000 MCG tablet Take 1,000 mcg by mouth daily.   HYDROcodone-acetaminophen 5-325 MG tablet Commonly known as:  NORCO/VICODIN Take 1 tablet by mouth every 4 (four) hours as needed for moderate pain.   losartan 25 MG tablet Commonly known as:  COZAAR TAKE 1/2 TABLET EVERY DAY   VITAMIN B1 PO Take 250 mg by mouth daily.   VITAMIN C PO Take 1,000 mg by mouth daily.      Follow-up Information    [facility] [place] Follow up on 10/29/2017.   Specialty:  General Surgery Why:  You have an appointment to remove all your staples at 10AM.  This will be a nurse visit only.Continue dressing changes to midline incision twice a day at home till it heals. Be at the office 30 minutes early for check in.Bring photo ID and Data processing manager information: [place] [address] [phone]        Excell Seltzer, MD Follow up.   Specialty:  General Surgery Why:  Your appointment is at 4:45 PM with  Dr.Hoxworth.  Bring photo ID and insurance information to both appointments.  Be at the office 30 minutes early for check in at both visitis. Contact information: [address] [place] [address] [phone]        Drake Leach, MD Follow up in 1 week(s).   Specialty:  Internal Medicine Why:  Call and make a follow up appointment for meidcal follow up after surgery.  He needs to recheck your labs, your kidney function is abnormal. Contact information: [address] [place] [address] [phone]        Christian Mate., MD Follow up.   Specialty:  Hematology and Oncology Why:  CAll and see him next week and let him decide on when to restart your oral chemotherapy.  Recheck your labs next week also. Contact information: [address] [place] [address] [phone]           Signed: Earnstine Regal 10/19/2017, 11:49 AM

## 2017-10-19 NOTE — Progress Notes (Signed)
Patient given discharge instructions, and verbalized an understanding of all discharge instructions.  Patient agrees with discharge plan, and is being discharged in stable medical condition.  Patient given transportation via wheelchair.  Dressing instructions were given to son Ronald, who is an Therapist, sports, and was very comfortable performing dressing changes.  Patient given dressing supplies.

## 2017-10-19 NOTE — Progress Notes (Signed)
5 Days Post-Op    CC:  Abdominal pain   Subjective: Doing well with p.o. diet.  Recommendations from PT for home PT.  The open portion of his mid abdomen is still little red at this edges.  I cleaned this out with 4 x 4's and applicator stick.  I think with the daily dressing changes that will clean up well.  Continue to ambulate and mobilize.   Objective: Vital signs in last 24 hours: Temp:  [97.6 F (36.4 C)-98.4 F (36.9 C)] 98.4 F (36.9 C) (11/30 0701) Pulse Rate:  [70-75] 70 (11/30 0701) Resp:  [16] 16 (11/30 0701) BP: (113-156)/(55-73) 131/61 (11/30 0701) SpO2:  [90 %-97 %] 97 % (11/30 0701) Weight:  [59 kg (130 lb 1.1 oz)] 59 kg (130 lb 1.1 oz) (11/30 0701) Last BM Date: 10/18/17 Voided x 2 BM x 2 recorded Afebrile, VSS Creatinine is up some, wBC is normal and H/H is stable  Intake/Output from previous day: No intake/output data recorded. Intake/Output this shift: No intake/output data recorded.  General appearance: alert, cooperative and no distress Resp: clear to auscultation bilaterally GI: Soft, sore, midline staples are healing.  He has a wet-to-dry dressing in the open midportion where he had some bleeding.  This is slowly clearing up.  Tolerating diet with bowel movements.  Lab Results:  Recent Labs    10/19/17 0725  WBC 6.7  HGB 10.2*  HCT 31.5*  PLT 158    BMET Recent Labs    10/19/17 0725  NA 135  K 4.1  CL 108  CO2 20*  GLUCOSE 96  BUN 32*  CREATININE 1.54*  CALCIUM 8.1*   PT/INR No results for input(s): LABPROT, INR in the last 72 hours.  Recent Labs  Lab 10/13/17 2143  AST 45*  ALT 47  ALKPHOS 116  BILITOT 0.4  PROT 7.1  ALBUMIN 3.9     Lipase     Component Value Date/Time   LIPASE 35 10/13/2017 2143     Medications: . aspirin  81 mg Oral Daily  . enoxaparin (LOVENOX) injection  40 mg Subcutaneous Q24H  . furosemide  20 mg Oral Daily  . hydrALAZINE  10 mg Intravenous Q6H  . metoprolol tartrate  5 mg Intravenous  Q6H  . pantoprazole (PROTONIX) IV  40 mg Intravenous QHS  . rosuvastatin  5 mg Oral Daily    Assessment/Plan Midgut volvulus Exploratory laparotomy for small bowel obstruction, cecopexy 10/14/17 Dr. Excell Seltzer POD 5 History of hiatal hernia repair cholecystectomy and partial colectomy 2014/small bowel perforation/small bowel resection1/2014  Acute hypoxemic respiratory failure due to volume overload Mild Obstructive Lung Disease, Emphysema noted on CT  - resolved   History of carotid artery occlusionCE 09/17/15 Hx chronic systolic congestive heart failure (LVH, EF 30-35% akinesis, multiple areas, grade 1 diastolic dysfunction, Mild to moderated AR) Hx chronic kidney disease-stage III- creatinine 1.48 -1.44 range up 11/30 to 1.54 CAD with LAD stent - Cardiology note 10/15/17 - stable, no current issues Chronic myelocytic leukemia- on oral chemotherapy.  FEN:IV fluids/clears ID: Cefoxitin preop dose only DVT: Lovenox Foley:out Follow-up: Dr. Excell Seltzer   Plan: Continue to mobilize today.  Saturations are good off O2.  We have asked case manager to see and assist with home health arrangements.  There are instructions for nursing to teach the family how to do the dressing changes.  We have done all the preliminary work for discharge home.  He has follow-up appointments with our office for staple removal on 10/29/17,  and then later with Dr. Excell Seltzer.  He sees Dr. [name] in [facility] for his chronic lymphocytic leukemia.  He is on Bosulif for his CLL.  And on Coreg at home.  I will restart his Coreg today he has been getting IV Lopressor.  I will hold off on the Cozaar until we see how he does with the Coreg.  Hopefully he will be ready to go home by tomorrow.  Everything except the drug reconciliation should be in the AVS already. He is really hard of hearing and now tells nursing he wants to go home today.  Will check with Dr. Kieth Brightly, and perhaps send home later.   Family per nursing hoping he might be here longer.        LOS: 5 days    Adana Marik 10/19/2017 [phone]

## 2017-10-19 NOTE — Progress Notes (Signed)
Occupational Therapy Treatment Patient Details Name: [name] [name] MRN: [id number] DOB: [birth year] Today's Date: 10/19/2017    History of present illness 83 yo male admitted with midgut volvulus. S/P explap, cecopexy 10/14/17. Hx of CHF, CKD, CML, CAD, hiatal hernia repair, colectomy.    OT comments  Patient reports he is discharging home as soon as his family arrives. Patient supervision level for BADLs at this time. OT goals met. He will discharge home with family assistance as needed. All questions answered. OT will sign off.   Follow Up Recommendations  No OT follow up    Equipment Recommendations  None recommended by OT    Recommendations for Other Services      Precautions / Restrictions Precautions Precautions: Fall Restrictions Weight Bearing Restrictions: No       Mobility Bed Mobility Overal bed mobility: Independent                Transfers Overall transfer level: Needs assistance Equipment used: None Transfers: Sit to/from Stand Sit to Stand: Supervision              Balance                                           ADL either performed or assessed with clinical judgement   ADL Overall ADL's : Needs assistance/impaired Eating/Feeding: Independent;Sitting   Grooming: Wash/dry hands;Supervision/safety;Standing                   Toilet Transfer: Supervision/safety;Ambulation;Regular Toilet   Toileting- Water quality scientist and Hygiene: Supervision/safety;Sit to/from stand       Functional mobility during ADLs: Supervision/safety General ADL Comments: Patient states he is discharging home today. Observed patient with toileting and grooming tasks. He also ambulated around room and tidied up his tray table including walking items over to put in trash can. Supervision level with all tasks.     Vision       Perception     Praxis      Cognition Arousal/Alertness: Awake/alert Behavior During Therapy: WFL for  tasks assessed/performed Overall Cognitive Status: Within Functional Limits for tasks assessed                                          Exercises     Shoulder Instructions       General Comments      Pertinent Vitals/ Pain       Pain Assessment: No/denies pain  Home Living                                          Prior Functioning/Environment              Frequency           Progress Toward Goals  OT Goals(current goals can now be found in the care plan section)  Progress towards OT goals: Goals met/education completed, patient discharged from [place] All goals met and education completed, patient discharged from OT services    Co-evaluation                 AM-PAC PT "6 Clicks" Daily Activity  Outcome Measure   Help from another person eating meals?: None Help from another person taking care of personal grooming?: None Help from another person toileting, which includes using toliet, bedpan, or urinal?: None Help from another person bathing (including washing, rinsing, drying)?: None Help from another person to put on and taking off regular upper body clothing?: None Help from another person to put on and taking off regular lower body clothing?: None 6 Click Score: 24    End of Session    OT Visit Diagnosis: Unsteadiness on feet (R26.81);Muscle weakness (generalized) (M62.81)   Activity Tolerance Patient tolerated treatment well   Patient Left in bed;with call bell/phone within reach   Nurse Communication          Time: 2081-3887 OT Time Calculation (min): 8 min  Charges: OT General Charges $OT Visit: 1 Visit OT Treatments $Self Care/Home Management : 8-22 mins    Emmi Wertheim A Hatem Cull 10/19/2017, 1:16 PM

## 2017-11-09 ENCOUNTER — Emergency Department (HOSPITAL_COMMUNITY): Payer: Medicare HMO | Admitting: Certified Registered Nurse Anesthetist

## 2017-11-09 ENCOUNTER — Encounter (HOSPITAL_COMMUNITY): Payer: Self-pay

## 2017-11-09 ENCOUNTER — Emergency Department (HOSPITAL_COMMUNITY): Payer: Medicare HMO

## 2017-11-09 ENCOUNTER — Encounter (HOSPITAL_COMMUNITY): Admission: EM | Disposition: A | Discharge status: Home Health | Payer: Self-pay | Source: Home / Self Care

## 2017-11-09 ENCOUNTER — Inpatient Hospital Stay (HOSPITAL_COMMUNITY)
Admission: EM | Admit: 2017-11-09 | Discharge: 2017-11-15 | DRG: 330 | Disposition: A | Discharge status: Home Health | Payer: Medicare HMO | Attending: Surgery | Admitting: Surgery

## 2017-11-09 DIAGNOSIS — I351 Nonrheumatic aortic (valve) insufficiency: Secondary | ICD-10-CM | POA: Diagnosis present

## 2017-11-09 DIAGNOSIS — I5022 Chronic systolic (congestive) heart failure: Secondary | ICD-10-CM | POA: Diagnosis present

## 2017-11-09 DIAGNOSIS — Z8249 Family history of ischemic heart disease and other diseases of the circulatory system: Secondary | ICD-10-CM

## 2017-11-09 DIAGNOSIS — Z823 Family history of stroke: Secondary | ICD-10-CM

## 2017-11-09 DIAGNOSIS — Z79891 Long term (current) use of opiate analgesic: Secondary | ICD-10-CM

## 2017-11-09 DIAGNOSIS — K5710 Diverticulosis of small intestine without perforation or abscess without bleeding: Secondary | ICD-10-CM | POA: Diagnosis present

## 2017-11-09 DIAGNOSIS — Z87442 Personal history of urinary calculi: Secondary | ICD-10-CM

## 2017-11-09 DIAGNOSIS — Z9049 Acquired absence of other specified parts of digestive tract: Secondary | ICD-10-CM

## 2017-11-09 DIAGNOSIS — Z833 Family history of diabetes mellitus: Secondary | ICD-10-CM | POA: Diagnosis not present

## 2017-11-09 DIAGNOSIS — Z888 Allergy status to other drugs, medicaments and biological substances status: Secondary | ICD-10-CM | POA: Diagnosis not present

## 2017-11-09 DIAGNOSIS — Z807 Family history of other malignant neoplasms of lymphoid, hematopoietic and related tissues: Secondary | ICD-10-CM

## 2017-11-09 DIAGNOSIS — Q433 Congenital malformations of intestinal fixation: Secondary | ICD-10-CM | POA: Diagnosis not present

## 2017-11-09 DIAGNOSIS — Z9911 Dependence on respirator [ventilator] status: Secondary | ICD-10-CM

## 2017-11-09 DIAGNOSIS — I2510 Atherosclerotic heart disease of native coronary artery without angina pectoris: Secondary | ICD-10-CM | POA: Diagnosis present

## 2017-11-09 DIAGNOSIS — Z955 Presence of coronary angioplasty implant and graft: Secondary | ICD-10-CM

## 2017-11-09 DIAGNOSIS — E872 Acidosis: Secondary | ICD-10-CM | POA: Diagnosis not present

## 2017-11-09 DIAGNOSIS — Z825 Family history of asthma and other chronic lower respiratory diseases: Secondary | ICD-10-CM | POA: Diagnosis not present

## 2017-11-09 DIAGNOSIS — E785 Hyperlipidemia, unspecified: Secondary | ICD-10-CM | POA: Diagnosis present

## 2017-11-09 DIAGNOSIS — D72829 Elevated white blood cell count, unspecified: Secondary | ICD-10-CM | POA: Diagnosis present

## 2017-11-09 DIAGNOSIS — Z885 Allergy status to narcotic agent status: Secondary | ICD-10-CM

## 2017-11-09 DIAGNOSIS — Z856 Personal history of leukemia: Secondary | ICD-10-CM

## 2017-11-09 DIAGNOSIS — I130 Hypertensive heart and chronic kidney disease with heart failure and stage 1 through stage 4 chronic kidney disease, or unspecified chronic kidney disease: Secondary | ICD-10-CM | POA: Diagnosis present

## 2017-11-09 DIAGNOSIS — Z87891 Personal history of nicotine dependence: Secondary | ICD-10-CM | POA: Diagnosis not present

## 2017-11-09 DIAGNOSIS — E874 Mixed disorder of acid-base balance: Secondary | ICD-10-CM | POA: Diagnosis not present

## 2017-11-09 DIAGNOSIS — N183 Chronic kidney disease, stage 3 unspecified: Secondary | ICD-10-CM | POA: Diagnosis present

## 2017-11-09 DIAGNOSIS — K562 Volvulus: Principal | ICD-10-CM | POA: Diagnosis present

## 2017-11-09 DIAGNOSIS — Z7982 Long term (current) use of aspirin: Secondary | ICD-10-CM

## 2017-11-09 DIAGNOSIS — Z9889 Other specified postprocedural states: Secondary | ICD-10-CM

## 2017-11-09 DIAGNOSIS — N179 Acute kidney failure, unspecified: Secondary | ICD-10-CM | POA: Diagnosis present

## 2017-11-09 DIAGNOSIS — R0602 Shortness of breath: Secondary | ICD-10-CM

## 2017-11-09 DIAGNOSIS — Z79899 Other long term (current) drug therapy: Secondary | ICD-10-CM

## 2017-11-09 DIAGNOSIS — I255 Ischemic cardiomyopathy: Secondary | ICD-10-CM | POA: Diagnosis present

## 2017-11-09 DIAGNOSIS — J439 Emphysema, unspecified: Secondary | ICD-10-CM | POA: Diagnosis present

## 2017-11-09 DIAGNOSIS — Z9289 Personal history of other medical treatment: Secondary | ICD-10-CM

## 2017-11-09 DIAGNOSIS — I6529 Occlusion and stenosis of unspecified carotid artery: Secondary | ICD-10-CM | POA: Diagnosis present

## 2017-11-09 DIAGNOSIS — I898 Other specified noninfective disorders of lymphatic vessels and lymph nodes: Secondary | ICD-10-CM | POA: Diagnosis present

## 2017-11-09 HISTORY — PX: LAPAROTOMY: SHX154

## 2017-11-09 LAB — URINALYSIS, ROUTINE W REFLEX MICROSCOPIC
Bacteria, UA: NONE SEEN
Bilirubin Urine: NEGATIVE
Glucose, UA: NEGATIVE mg/dL
Hgb urine dipstick: NEGATIVE
Ketones, ur: NEGATIVE mg/dL
Leukocytes, UA: NEGATIVE
Nitrite: NEGATIVE
Protein, ur: 100 mg/dL — AB
Specific Gravity, Urine: 1.011 (ref 1.005–1.030)
Squamous Epithelial / LPF: NONE SEEN
pH: 6 (ref 5.0–8.0)

## 2017-11-09 LAB — CBC
HCT: 40.2 % (ref 39.0–52.0)
Hemoglobin: 13.1 g/dL (ref 13.0–17.0)
MCH: 29 pg (ref 26.0–34.0)
MCHC: 32.6 g/dL (ref 30.0–36.0)
MCV: 88.9 fL (ref 78.0–100.0)
Platelets: 170 10*3/uL (ref 150–400)
RBC: 4.52 MIL/uL (ref 4.22–5.81)
RDW: 15.8 % — ABNORMAL HIGH (ref 11.5–15.5)
WBC: 9.7 10*3/uL (ref 4.0–10.5)

## 2017-11-09 LAB — COMPREHENSIVE METABOLIC PANEL
ALT: 35 U/L (ref 17–63)
AST: 38 U/L (ref 15–41)
Albumin: 4 g/dL (ref 3.5–5.0)
Alkaline Phosphatase: 105 U/L (ref 38–126)
Anion gap: 8 (ref 5–15)
BUN: 17 mg/dL (ref 6–20)
CO2: 25 mmol/L (ref 22–32)
Calcium: 9.3 mg/dL (ref 8.9–10.3)
Chloride: 105 mmol/L (ref 101–111)
Creatinine, Ser: 1.37 mg/dL — ABNORMAL HIGH (ref 0.61–1.24)
GFR calc Af Amer: 53 mL/min — ABNORMAL LOW (ref >60)
GFR calc non Af Amer: 45 mL/min — ABNORMAL LOW (ref >60)
Glucose, Bld: 101 mg/dL — ABNORMAL HIGH (ref 65–99)
Potassium: 4.3 mmol/L (ref 3.5–5.1)
Sodium: 138 mmol/L (ref 135–145)
Total Bilirubin: 0.7 mg/dL (ref 0.3–1.2)
Total Protein: 8 g/dL (ref 6.5–8.1)

## 2017-11-09 LAB — LIPASE, BLOOD: Lipase: 32 U/L (ref 11–51)

## 2017-11-09 LAB — I-STAT CG4 LACTIC ACID, ED: Lactic Acid, Venous: 0.85 mmol/L (ref 0.5–1.9)

## 2017-11-09 IMAGING — CT CT ABD-PELV W/O CM
2 of 4 series · 14 of 46 positions shown, 16 images · non-contrast
Comparison: Abdominal CT dated [DATE]

CLINICAL DATA: 87-year-old male with increasing abdominal and
epigastric pain.

EXAM:
CT ABDOMEN AND PELVIS WITHOUT CONTRAST
TECHNIQUE: Multidetector CT imaging of the abdomen and pelvis was performed
following the standard protocol without IV contrast.

[Series 2: axial st · axial · 0.78mm/px · z∈[-348,+42]mm · 11 of 88 slices shown, 13 images]
[im 5/88  soft-tissue]
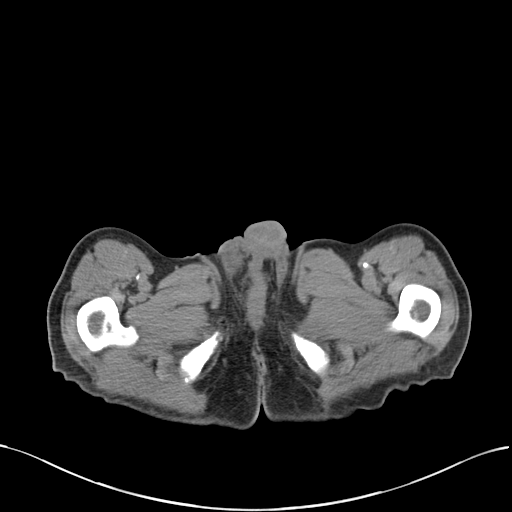
[im 5/88  bone]
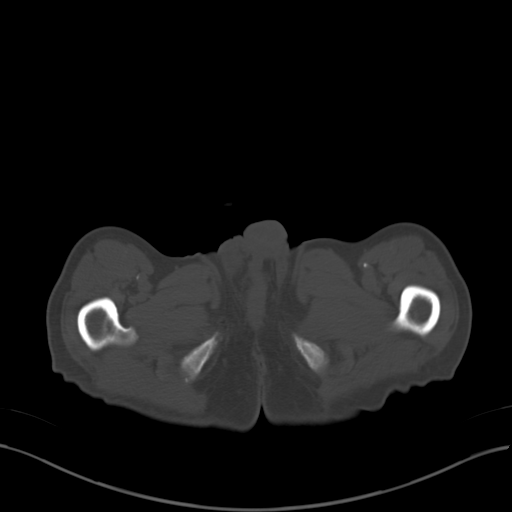
[im 14/88  soft-tissue]
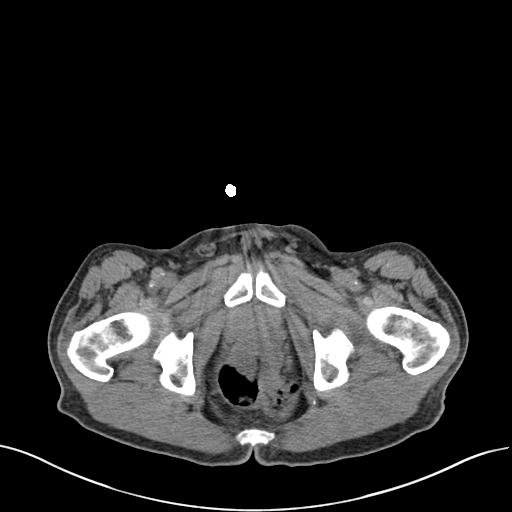
[im 22/88  soft-tissue]
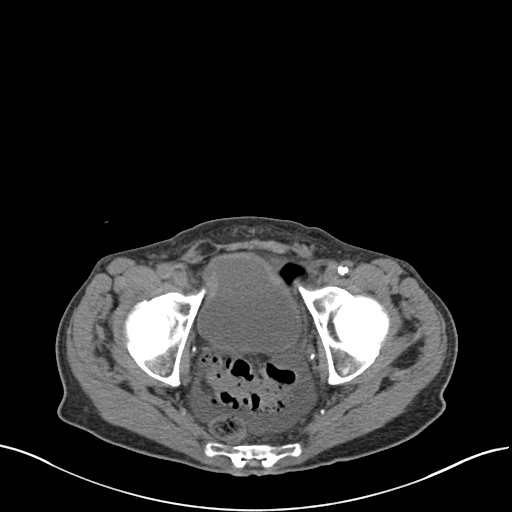
[im 31/88  soft-tissue]
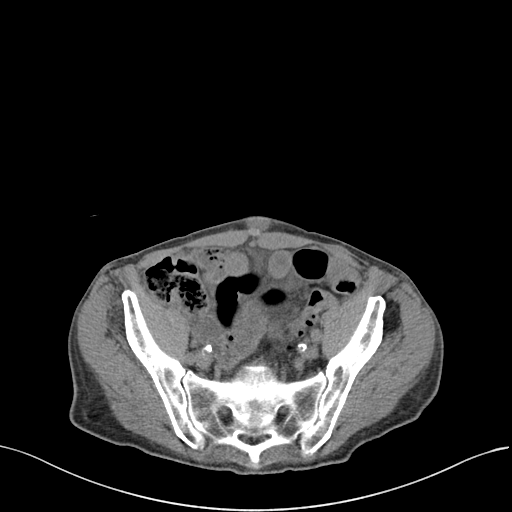
[im 35/88  soft-tissue]
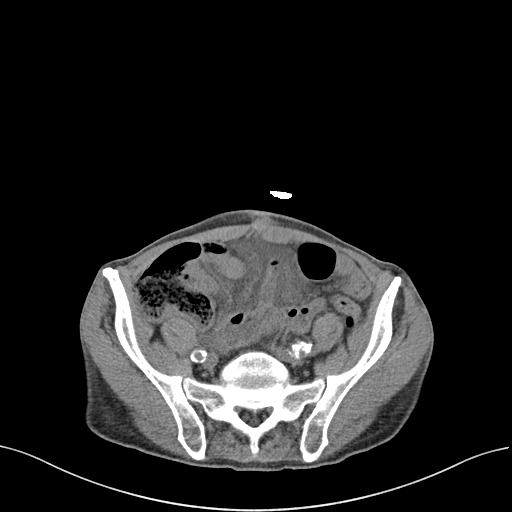
[im 44/88  soft-tissue]
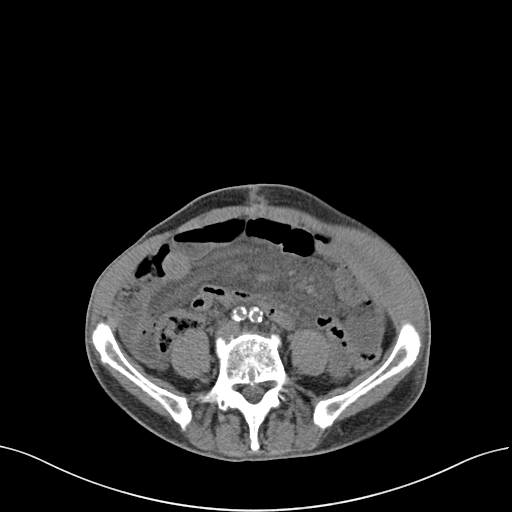
[im 53/88  soft-tissue]
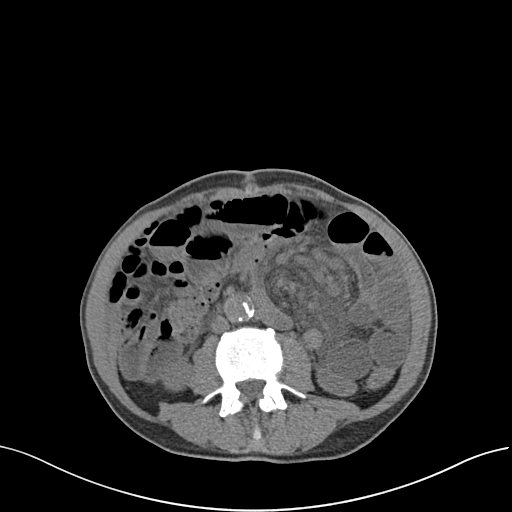
[im 57/88  soft-tissue]
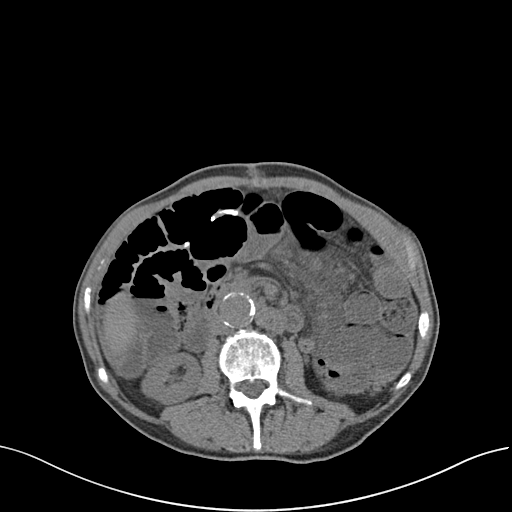
[im 66/88  soft-tissue]
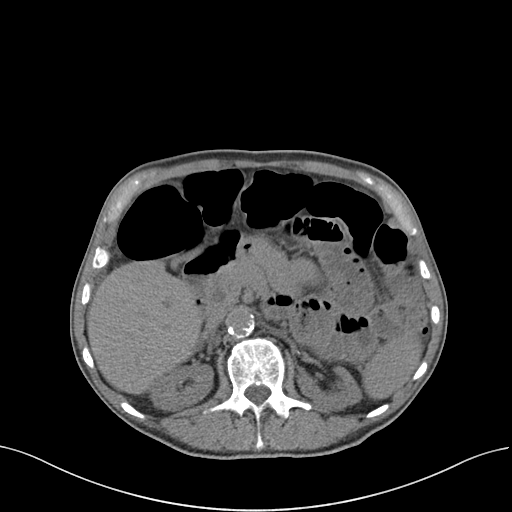
[im 66/88  bone]
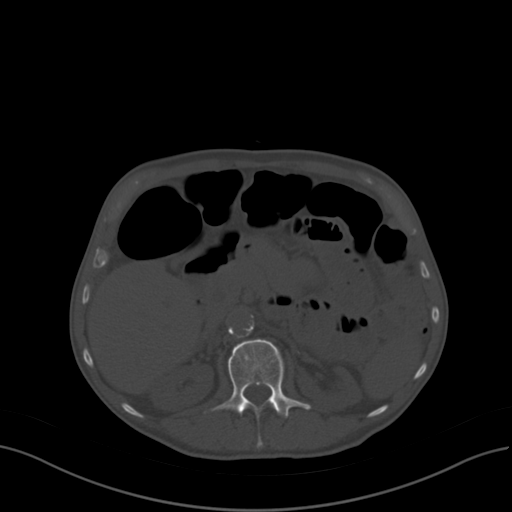
[im 74/88  soft-tissue]
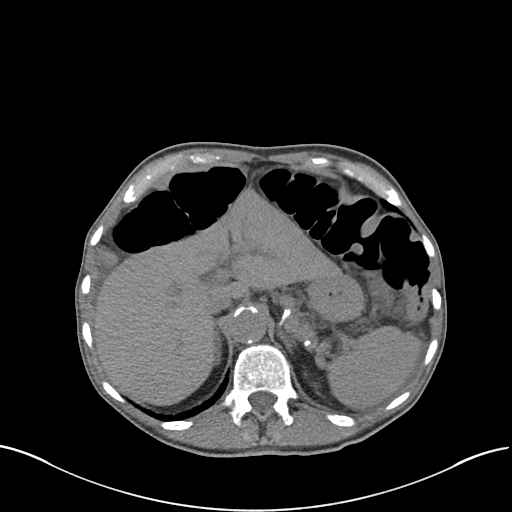
[im 83/88  soft-tissue]
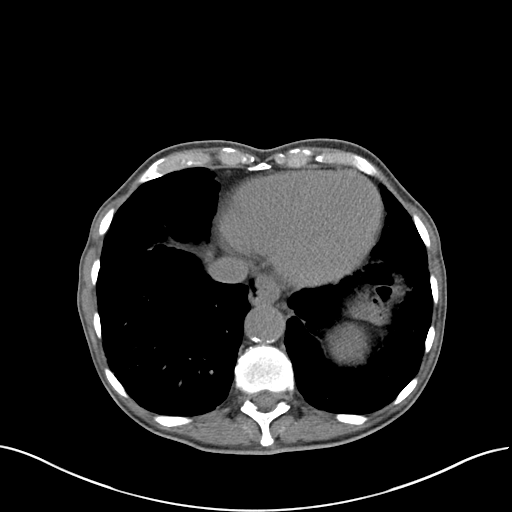

[Series 5: coronal st · coronal · 0.67mm/px · 3 of 96 slices shown]
[im 32/96  soft-tissue]
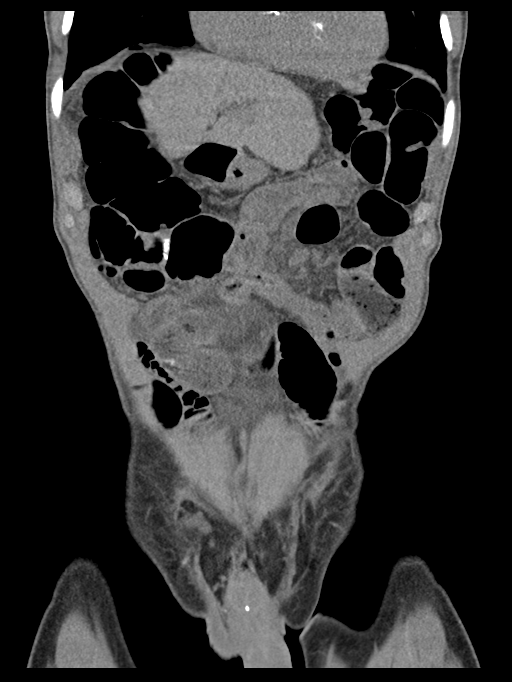
[im 43/96  soft-tissue]
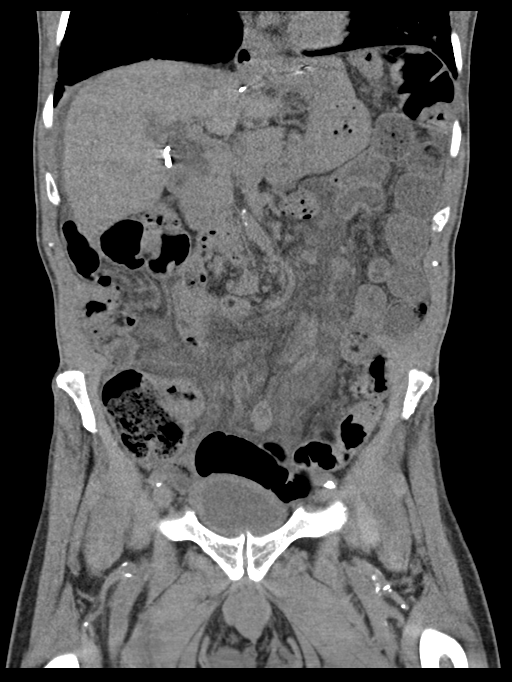
[im 53/96  soft-tissue]
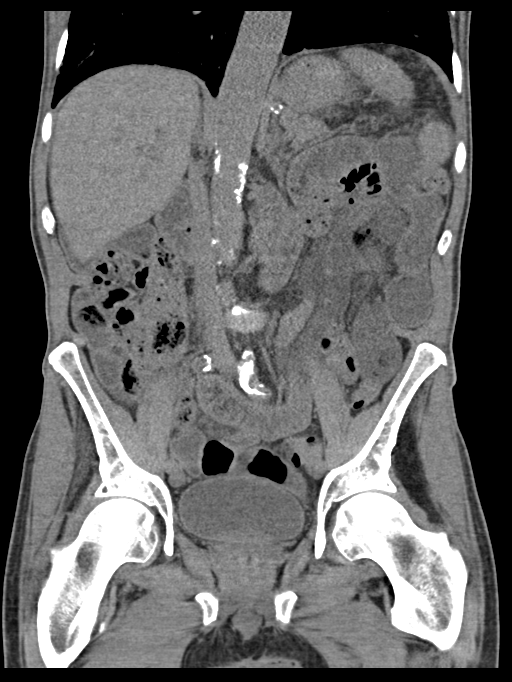

[14 of 46 positions shown; findings below may reference images not displayed]

FINDINGS: Evaluation of this exam is limited in the absence of intravenous
contrast.

Lower chest: There is emphysematous changes of the visualized lung
bases.

No intra-abdominal free air. There is diffuse mesenteric edema as
well as small ascites. Overall increase in the amount of ascites
compared to the prior CT.

Hepatobiliary: The liver is unremarkable. There is mild dilatation
of the intrahepatic and intrahepatic biliary trees, possibly post
cholecystectomy changes. No calcified stone noted in the central
CBD.

Pancreas: Unremarkable. No pancreatic ductal dilatation or
surrounding inflammatory changes.

Spleen: Normal in size without focal abnormality.

Adrenals/Urinary Tract: The adrenal glands are unremarkable. Mild
right and moderate left renal parenchyma atrophy. There is chronic
mild left hydronephrosis. There is a 6 mm nonobstructing stone in
the inferior pole of the left kidney as well as smaller
nonobstructing calculi in the upper pole of the left kidney. A 4 mm
distal left ureteral stone as seen on the prior CT. Mild thickened
and trabeculated appearance of the bladder wall, likely related to
chronic bladder outlet obstruction.

Stomach/Bowel: The stomach is decompressed. Surgical clips at the
gastroesophageal junction noted. There is sigmoid diverticulosis.
There is postsurgical changes of the bowel with anastomotic suture
in the right anterior hemiabdomen. The anastomotic suture was seen
on the study of [DATE] in the left hemiabdomen. Mildly dilated
somewhat fecalized loops of small bowel primarily in the left
hemiabdomen. No pneumatosis.

Vascular/Lymphatic: Advanced aortoiliac atherosclerotic disease.
There is a 2.7 cm infrarenal aortic ectasia. There is twisting of
the mesentery root with twisting of the SMV around the SMA
consistent with mesenteric or mid gut volvulus. Diffuse mesenteric
edema, similar or slightly increased from prior study. No portal
venous gas. Mildly enlarged mesenteric lymph nodes, likely reactive.

Reproductive: The prostate and seminal vesicles are grossly
unremarkable.

Other: Midline vertical anterior abdominal wall incisional scar.

Musculoskeletal: Mild degenerative changes of the spine. No acute
osseous pathology.
IMPRESSION: 1. Findings consistent with mesenteric or midgut volvulus with
mildly dilated small bowel loops in the left hemiabdomen. Diffuse
mesenteric edema and small ascites, increased from prior CT. No
pneumatosis or portal venous gas. No free air or evidence of bowel
perforation. Clinical correlation and surgical consult is advised.
2. Chronic left renal atrophy and mild left hydronephrosis. A 4 mm
distal left ureteral stone as well as a nonobstructing stone in the
inferior pole of the left kidney appears similar to prior CT.
3. Advanced Aortic Atherosclerosis ([DH]-[DH]).
4. A 2.7 cm infrarenal aortic ectasia. Ectatic abdominal aorta at
risk for aneurysm development. Recommend followup by ultrasound in 5
years. This recommendation follows ACR consensus guidelines: White
Paper of the ACR Incidental Findings Committee II on Vascular
Findings. [HOSPITAL] [DH]; [DATE].
5. Emphysema.

These results were called by telephone at the time of interpretation
on [DATE] at [DATE] to PA. ELVAR RZA , who verbally
acknowledged these results.

## 2017-11-09 SURGERY — LAPAROTOMY, EXPLORATORY
Anesthesia: General | Site: Abdomen

## 2017-11-09 MED ORDER — FENTANYL CITRATE (PF) 100 MCG/2ML IJ SOLN
50.0000 ug | Freq: Once | INTRAMUSCULAR | Status: AC
  Administered 2017-11-09: 50 ug via INTRAVENOUS
  Filled 2017-11-09: qty 2

## 2017-11-09 MED ORDER — FENTANYL CITRATE (PF) 250 MCG/5ML IJ SOLN
INTRAMUSCULAR | Status: AC
  Filled 2017-11-09: qty 5

## 2017-11-09 MED ORDER — IOPAMIDOL (ISOVUE-300) INJECTION 61%: 30.0000 mL | Freq: Once | INTRAVENOUS | Status: DC | PRN

## 2017-11-09 MED ORDER — SODIUM CHLORIDE 0.9 % IV BOLUS (SEPSIS)
500.0000 mL | Freq: Once | INTRAVENOUS | Status: AC
  Administered 2017-11-09: 500 mL via INTRAVENOUS

## 2017-11-09 MED ORDER — PIPERACILLIN-TAZOBACTAM 3.375 G IVPB 30 MIN
3.3750 g | Freq: Once | INTRAVENOUS | Status: AC
  Administered 2017-11-09: 3.375 g via INTRAVENOUS
  Filled 2017-11-09: qty 50

## 2017-11-09 MED ORDER — HYDROMORPHONE HCL 1 MG/ML IJ SOLN
0.5000 mg | Freq: Once | INTRAMUSCULAR | Status: AC
  Administered 2017-11-09: 0.5 mg via INTRAVENOUS
  Filled 2017-11-09: qty 1

## 2017-11-09 MED ORDER — HYDROMORPHONE HCL 1 MG/ML IJ SOLN
1.0000 mg | Freq: Once | INTRAMUSCULAR | Status: AC
  Administered 2017-11-09: 1 mg via INTRAVENOUS
  Filled 2017-11-09: qty 1

## 2017-11-09 MED ORDER — PROPOFOL 10 MG/ML IV BOLUS
INTRAVENOUS | Status: AC
  Filled 2017-11-09: qty 20

## 2017-11-09 MED ORDER — IOPAMIDOL (ISOVUE-300) INJECTION 61%
INTRAVENOUS | Status: AC
  Filled 2017-11-09: qty 30

## 2017-11-09 MED ORDER — SODIUM CHLORIDE 0.9 % IV SOLN
Freq: Once | INTRAVENOUS | Status: AC
  Administered 2017-11-09: 20:00:00 via INTRAVENOUS

## 2017-11-09 MED ORDER — ONDANSETRON HCL 4 MG/2ML IJ SOLN
4.0000 mg | Freq: Once | INTRAMUSCULAR | Status: AC
  Administered 2017-11-09: 4 mg via INTRAVENOUS
  Filled 2017-11-09: qty 2

## 2017-11-09 SURGICAL SUPPLY — 38 items
BLADE EXTENDED COATED 6.5IN (ELECTRODE) IMPLANT
CHLORAPREP W/TINT 26ML (MISCELLANEOUS) ×2 IMPLANT
COVER MAYO STAND STRL (DRAPES) ×2 IMPLANT
COVER SURGICAL LIGHT HANDLE (MISCELLANEOUS) ×2 IMPLANT
DRAPE LAPAROSCOPIC ABDOMINAL (DRAPES) ×2 IMPLANT
DRSG OPSITE POSTOP 4X10 (GAUZE/BANDAGES/DRESSINGS) IMPLANT
DRSG OPSITE POSTOP 4X6 (GAUZE/BANDAGES/DRESSINGS) IMPLANT
DRSG OPSITE POSTOP 4X8 (GAUZE/BANDAGES/DRESSINGS) IMPLANT
ELECT PENCIL ROCKER SW 15FT (MISCELLANEOUS) ×2 IMPLANT
ELECT REM PT RETURN 15FT ADLT (MISCELLANEOUS) ×2 IMPLANT
GAUZE SPONGE 4X4 12PLY STRL (GAUZE/BANDAGES/DRESSINGS) ×2 IMPLANT
GLOVE BIO SURGEON STRL SZ7 (GLOVE) ×4 IMPLANT
GLOVE BIOGEL PI IND STRL 7.5 (GLOVE) ×2 IMPLANT
GLOVE BIOGEL PI INDICATOR 7.5 (GLOVE) ×2
GOWN STRL REUS W/TWL LRG LVL3 (GOWN DISPOSABLE) ×4 IMPLANT
GOWN STRL REUS W/TWL XL LVL3 (GOWN DISPOSABLE) ×8 IMPLANT
HANDLE SUCTION POOLE (INSTRUMENTS) ×1 IMPLANT
PACK COLON (CUSTOM PROCEDURE TRAY) ×2 IMPLANT
PAD ABD 7.5X8 STRL (GAUZE/BANDAGES/DRESSINGS) ×2 IMPLANT
SEALER TISSUE X1 CVD JAW (INSTRUMENTS) IMPLANT
SPONGE LAP 18X18 X RAY DECT (DISPOSABLE) IMPLANT
STAPLER VISISTAT 35W (STAPLE) ×2 IMPLANT
SUCTION POOLE HANDLE (INSTRUMENTS) ×2
SUT NOVA NAB GS-21 0 18 T12 DT (SUTURE) IMPLANT
SUT NOVA NAB GS-21 1 T12 (SUTURE) IMPLANT
SUT PDS AB 1 CTX 36 (SUTURE) IMPLANT
SUT PDS AB 1 TP1 96 (SUTURE) ×4 IMPLANT
SUT SILK 2 0 (SUTURE) ×1
SUT SILK 2 0 SH CR/8 (SUTURE) ×2 IMPLANT
SUT SILK 2-0 18XBRD TIE 12 (SUTURE) ×1 IMPLANT
SUT SILK 3 0 (SUTURE) ×1
SUT SILK 3 0 SH CR/8 (SUTURE) ×2 IMPLANT
SUT SILK 3-0 18XBRD TIE 12 (SUTURE) ×1 IMPLANT
TAPE CLOTH SURG 4X10 WHT LF (GAUZE/BANDAGES/DRESSINGS) ×2 IMPLANT
TOWEL OR 17X26 10 PK STRL BLUE (TOWEL DISPOSABLE) IMPLANT
TOWEL OR NON WOVEN STRL DISP B (DISPOSABLE) ×2 IMPLANT
TRAY FOLEY W/METER SILVER 14FR (SET/KITS/TRAYS/PACK) IMPLANT
TRAY FOLEY W/METER SILVER 16FR (SET/KITS/TRAYS/PACK) ×2 IMPLANT

## 2017-11-09 NOTE — H&P (Signed)
[name] [name] is an 83 y.o. male.   Chief Complaint: abd pain HPI: 14 yom with multiple medical issues who presents with worsening abdominal pain for 24 hours.  He is not passing flatus.  Not bm. Dry heaves occurring.  This occurs in waves and nothing making it better.  He is grimacing in pain when I see him.  He does not note blood in stool. 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  His bowel was moderately dilated but not ischemic.  There was elongated mesentery with twist at sma pedicle .  He untwisted and did cecopexy.  Postop he was followed by cards and ccm.  He had ileus that resolved.  He then went home and did well until now.  He was having bms, was eating (had pizza yesterday).  Then this episode occurred.  Ct is done with concern for recurrence of volvulus and he states this is much worse than even last time.      Past Medical History:  Diagnosis Date  . Carotid artery occlusion   . Chronic systolic CHF (congestive heart failure) ([place])    a. 07/2013 Echo: EF 35-40%  . CKD (chronic kidney disease), stage III ([place])    pt. states that he does not have CKD  . CML (chronic myelocytic leukemia) ([place])   . Coronary artery disease    a. 09/2013 Cath: LM min irregs, LAD 95p(2.75x20 Promus Premier DES & 3.0x20 Promus Premier DES overlapping), D1 50-60, LCX 30p, OM1 45m OM2 60-70p, RCA small, nl.  . Diverticulitis   . Dysphonia 01/08/2017  . History of hiatal hernia   . History of kidney stones   . History of small bowel obstruction   . Hyperlipidemia   . Ischemic cardiomyopathy    a. 07/2013 Echo: EF 35-40%, apical septal, apical lat, apical AK, mod MR.  . [place]    a. 11/2012 s/p emergent SB resection.  . 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 CARDIAC CATHETERIZATION  10/09/2013  . CAROTID ENDARTERECTOMY    . COLON SURGERY  11/2012   BOWEL SURGERY   . CORONARY ANGIOPLASTY  10/09/2013   MID LAD    . ENDARTERECTOMY Right 09/17/2015   Procedure: Right Carotid ENDARTERECTOMY with Xenosure Patch Angioplasty;  Surgeon: VSerafina Mitchell MD;  [facility]  Service: Vascular;  Laterality: Right;  . EYE SURGERY Bilateral   . INGUINAL HERNIA REPAIR Bilateral   . LAPAROSCOPIC CHOLECYSTECTOMY    . LAPAROTOMY N/A 10/14/2017   Procedure: EXPLORATORY LAPAROTOMY for SMALL BOWEL OBSTRUCTION, cecopexy;  Surgeon: HExcell Seltzer MD;  Location: [facility];  Service: General;  Laterality: N/A;  . LEFT HEART CATHETERIZATION WITH CORONARY ANGIOGRAM N/A 10/09/2013   Procedure: LEFT HEART CATHETERIZATION WITH CORONARY ANGIOGRAM;  Surgeon: DLarey Dresser MD;  [facility];  Service: Cardiovascular;  Laterality: N/A;  . PERCUTANEOUS CORONARY STENT INTERVENTION (PCI-S)  10/09/2013   Procedure: PERCUTANEOUS CORONARY STENT INTERVENTION (PCI-S);  Surgeon: DLarey Dresser MD;  [facility];  Service: Cardiovascular;;    Family History  Problem Relation Age of Onset  . Emphysema Father        died @ 672 smoked  . Heart disease Father   . Kidney Stones Father   . Diabetes Mother        died @ 736 . Stroke Mother   . Head & neck cancer Brother   . Lymphoma  Brother   . Colon cancer Neg Hx   . Stomach cancer Neg Hx   . Rectal cancer Neg Hx   . Esophageal cancer Neg Hx   . Liver cancer Neg Hx    Social History:  reports that he quit smoking about 18 years ago. His smoking use included cigarettes. He has a 45.00 pack-year smoking history. he has never used smokeless tobacco. He reports that he does not drink alcohol or use drugs.  Allergies:  Allergies  Allergen Reactions  . Percocet [Oxycodone-Acetaminophen] Itching  . Metronidazole Other (See Comments)    Insomnia and nervousness    meds reviewed  Results for orders placed or performed during the hospital encounter of 11/09/17 (from the past 48 hour(s))  Lipase, blood     Status: None   Collection Time: 11/09/17  5:32 PM   Result Value Ref Range   Lipase 32 11 - 51 U/L  Comprehensive metabolic panel     Status: Abnormal   Collection Time: 11/09/17  5:32 PM  Result Value Ref Range   Sodium 138 135 - 145 mmol/L   Potassium 4.3 3.5 - 5.1 mmol/L   Chloride 105 101 - 111 mmol/L   CO2 25 22 - 32 mmol/L   Glucose, Bld 101 (H) 65 - 99 mg/dL   BUN 17 6 - 20 mg/dL   Creatinine, Ser 1.37 (H) 0.61 - 1.24 mg/dL   Calcium 9.3 8.9 - 10.3 mg/dL   Total Protein 8.0 6.5 - 8.1 g/dL   Albumin 4.0 3.5 - 5.0 g/dL   AST 38 15 - 41 U/L   ALT 35 17 - 63 U/L   Alkaline Phosphatase 105 38 - 126 U/L   Total Bilirubin 0.7 0.3 - 1.2 mg/dL   GFR calc non Af Amer 45 (L) >60 mL/min   GFR calc Af Amer 53 (L) >60 mL/min    Comment: (NOTE) The eGFR has been calculated using the CKD EPI equation. This calculation has not been validated in all clinical situations. eGFR's persistently <60 mL/min signify possible Chronic Kidney Disease.    Anion gap 8 5 - 15  CBC     Status: Abnormal   Collection Time: 11/09/17  5:32 PM  Result Value Ref Range   WBC 9.7 4.0 - 10.5 K/uL   RBC 4.52 4.22 - 5.81 MIL/uL   Hemoglobin 13.1 13.0 - 17.0 g/dL   HCT 40.2 39.0 - 52.0 %   MCV 88.9 78.0 - 100.0 fL   MCH 29.0 26.0 - 34.0 pg   MCHC 32.6 30.0 - 36.0 g/dL   RDW 15.8 (H) 11.5 - 15.5 %   Platelets 170 150 - 400 K/uL  Urinalysis, Routine w reflex microscopic     Status: Abnormal   Collection Time: 11/09/17  5:45 PM  Result Value Ref Range   Color, Urine YELLOW YELLOW   APPearance CLEAR CLEAR   Specific Gravity, Urine 1.011 1.005 - 1.030   pH 6.0 5.0 - 8.0   Glucose, UA NEGATIVE NEGATIVE mg/dL   Hgb urine dipstick NEGATIVE NEGATIVE   Bilirubin Urine NEGATIVE NEGATIVE   Ketones, ur NEGATIVE NEGATIVE mg/dL   Protein, ur 100 (A) NEGATIVE mg/dL   Nitrite NEGATIVE NEGATIVE   Leukocytes, UA NEGATIVE NEGATIVE   RBC / HPF 0-5 0 - 5 RBC/hpf   WBC, UA 0-5 0 - 5 WBC/hpf   Bacteria, UA NONE SEEN NONE SEEN   Squamous Epithelial / LPF NONE SEEN NONE  SEEN   Mucus PRESENT  Hyaline Casts, UA PRESENT   I-Stat CG4 Lactic Acid, ED     Status: None   Collection Time: 11/09/17  9:05 PM  Result Value Ref Range   Lactic Acid, Venous 0.85 0.5 - 1.9 mmol/L   Ct Abdomen Pelvis Wo Contrast  Result Date: 11/09/2017 CLINICAL DATA:  83 year old male with increasing abdominal and epigastric pain. EXAM: CT ABDOMEN AND PELVIS WITHOUT CONTRAST TECHNIQUE: Multidetector CT imaging of the abdomen and pelvis was performed following the standard protocol without IV contrast. COMPARISON:  Abdominal CT dated 10/14/2017 FINDINGS: Evaluation of this exam is limited in the absence of intravenous contrast. Lower chest: There is emphysematous changes of the visualized lung bases. No intra-abdominal free air. There is diffuse mesenteric edema as well as small ascites. Overall increase in the amount of ascites compared to the prior CT. Hepatobiliary: The liver is unremarkable. There is mild dilatation of the intrahepatic and intrahepatic biliary trees, possibly post cholecystectomy changes. No calcified stone noted in the central CBD. Pancreas: Unremarkable. No pancreatic ductal dilatation or surrounding inflammatory changes. Spleen: Normal in size without focal abnormality. Adrenals/Urinary Tract: The adrenal glands are unremarkable. Mild right and moderate left renal parenchyma atrophy. There is chronic mild left hydronephrosis. There is a 6 mm nonobstructing stone in the inferior pole of the left kidney as well as smaller nonobstructing calculi in the upper pole of the left kidney. A 4 mm distal left ureteral stone as seen on the prior CT. Mild thickened and trabeculated appearance of the bladder wall, likely related to chronic bladder outlet obstruction. Stomach/Bowel: The stomach is decompressed. Surgical clips at the gastroesophageal junction noted. There is sigmoid diverticulosis. There is postsurgical changes of the bowel with anastomotic suture in the right anterior  hemiabdomen. The anastomotic suture was seen on the study of 08/07/2017 in the left hemiabdomen. Mildly dilated somewhat fecalized loops of small bowel primarily in the left hemiabdomen. No pneumatosis. Vascular/Lymphatic: Advanced aortoiliac atherosclerotic disease. 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 There is twisting of the mesentery root with twisting of the SMV around the SMA consistent with mesenteric or mid gut volvulus. Diffuse mesenteric edema, similar or slightly increased from prior study. No portal venous gas. Mildly enlarged mesenteric lymph nodes, likely reactive. Reproductive: The prostate and seminal vesicles are grossly unremarkable. Other: Midline vertical anterior abdominal wall incisional scar. Musculoskeletal: Mild degenerative changes of the spine. No acute osseous pathology. IMPRESSION: 1. 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 Diffuse mesenteric edema and small ascites, increased from prior CT. No pneumatosis or portal venous gas. No free air or evidence of bowel perforation. Clinical correlation and surgical consult is advised. 2. Chronic left renal atrophy and mild left hydronephrosis. A 4 mm distal left ureteral stone as well as a nonobstructing stone in the inferior pole of the left kidney appears similar to prior CT. 3. Advanced Aortic Atherosclerosis (ICD10-I70.0). 4. 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 Ectatic abdominal aorta at risk for aneurysm development. Recommend followup by ultrasound in 5 years. This recommendation follows ACR consensus guidelines: White Paper of the ACR Incidental Findings Committee II on Vascular Findings. J Am Coll Radiol 2013; 10:789-794. 5. Emphysema. These results were called by telephone at the time of interpretation on 11/09/2017 at 7:52 pm to PA. SAMANTHA PETRUCELLI , who verbally acknowledged these results. Electronically Signed   By: Anner Crete M.D.   On:  11/09/2017 19:57    Review of Systems  Gastrointestinal: Positive  for abdominal pain and nausea.  All other systems reviewed and are negative.   Blood pressure (!) 193/96, pulse (!) 56, temperature 97.7 F (36.5 C), temperature source Oral, resp. rate 16, SpO2 92 %. Physical Exam  Vitals reviewed. Constitutional: He is oriented to person, place, and time. He appears well-developed.  HENT:  Head: Normocephalic and atraumatic.  Eyes: No scleral icterus.  Neck: Neck supple.  Cardiovascular: Normal rate, regular rhythm and normal heart sounds.  Respiratory: Effort normal and breath sounds normal.  GI: He exhibits distension. Bowel sounds are decreased. There is generalized tenderness. There is guarding. No hernia.    Musculoskeletal: Normal range of motion.  Neurological: He is alert and oriented to person, place, and time.  Skin: Skin is warm. He is diaphoretic.     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 I think that observation and attempt at conservative therapy could be catastrophic.  It is not a good time to reexplore a month postop and that has its own risks but I dont think another choice at this point.  Discussed elap with possible bowel resection. We discussed possible open abdomen and reoperation.  We discussed risks including but not limited to recurrence, bleeding, infection, reoperation, fistulas, bowel injury, pna, mi, stroke, long term mechanical ventilation and death. He and his family understand these risks and we will proceed asap  Rolm Bookbinder, MD 11/09/2017, 10:59 PM

## 2017-11-09 NOTE — ED Notes (Signed)
CHG bath given by Merrily Pew, NT. Dentures placed in denture cup. Family has pt's belonging including hearing aid.

## 2017-11-09 NOTE — ED Provider Notes (Addendum)
Medical screening examination/treatment/procedure(s) were conducted as a shared visit with non-physician practitioner(s) and myself.  I personally evaluated the patient during the encounter.   EKG Interpretation None     Patient has history of midgut volvulus with surgical repair [phone].  Today around noon he started getting discomfort in his abdomen which quickly got much worse after he came to the emergency department.  Severe nausea but not vomiting yet.  Patient is alert and appropriate but appears to be in a lot of pain.  Heart is regular.  Lungs are clear.  Abdomen is moderately tender in the mid and epigastrium.  No guarding at this time.  Surgical wound is well-healed.  Proceed with pain medications, Zofran and CT scan.  I will continue to monitor the patient and supervise PA-C for response to pain medications and final disposition.  CT returns positive for volvulus.  Consult placed to Dr. Excell Seltzer.  Advises to start patient on Zosyn and obtain i-STAT lactic.  Will consult on the patient in the emergency department.   CRITICAL CARE Performed by: Si Gaul   Total critical care time: 30 minutes  Critical care time was exclusive of separately billable procedures and treating other patients.  Critical care was necessary to treat or prevent imminent or life-threatening deterioration.  Critical care was time spent personally by me on the following activities: development of treatment plan with patient and/or surrogate as well as nursing, discussions with consultants, evaluation of patient's response to treatment, examination of patient, obtaining history from patient or surrogate, ordering and performing treatments and interventions, ordering and review of laboratory studies, ordering and review of radiographic studies, pulse oximetry and re-evaluation of patient's condition. Charlesetta Shanks, MD 11/09/17 Marval Regal, MD 11/09/17 Lavonna Monarch    Charlesetta Shanks,  MD 11/09/17 2011

## 2017-11-09 NOTE — ED Notes (Signed)
Bld cultures were collected before antibiotic was started.

## 2017-11-09 NOTE — ED Notes (Signed)
Bed: JW90 Expected date:  Expected time:  Means of arrival:  Comments: EMS-abdominal pain

## 2017-11-09 NOTE — ED Triage Notes (Signed)
Per EMS-sudden onset of sharp abdominal pain-started around 12 noon-nausea, no vomiting or diarrhea-surgery last month for a twisted bowel-no distention or rigidity

## 2017-11-09 NOTE — ED Notes (Signed)
Returned from CT.

## 2017-11-09 NOTE — ED Provider Notes (Signed)
[facility] DEPT Provider Note   CSN: [id number] Arrival date & time: 11/09/17  1710     History   Chief Complaint Chief Complaint  Patient presents with  . Abdominal Pain    HPI [name] [name] is a 83 y.o. male with a hx of CHF CKD stage III, CML, diverticulitis, small bowel resection,hiatal hernia repair, and cholecystectomy arrives to the ED via EMS complaining of gradual onset mid abdominal pain that started at 11:00 today after eating breakfast.  Patient states that he had eggs, bacon, and fruit for breakfast.  Shortly afterwards had gradual onset of diffuse primarily mid abdomen abdominal pain.  States that since onset pain has been waxing and waning.  At present pain is a 6 out of 10 in severity.  Reports associated nausea without vomiting.  Most recent bowel movement was this morning prior to onset of pain and was normal for patient.  Denies constipation, diarrhea, or blood in stool. Patient with recent laparotomy 10/14/17 by Dr. Excell Seltzer after CT scan that revealed evidence of partial bowel obstruction but more significantly twisting of the mesentery and displacement of the bowel consistent with midgut volvulus. Cecopexy performed during laparotomy.  Has been doing well postop, was placed on Keflex for erythema around the surgical incision site, has had 7 days worth of abx. Denies dysuria, urinary frequency, testicular pain, fever, or chills. Denies chest pain, dyspnea, change in vision, headache, numbness, or weakness.   HPI  Past Medical History:  Diagnosis Date  . Carotid artery occlusion   . Chronic systolic CHF (congestive heart failure) ([place])    a. 07/2013 Echo: EF 35-40%  . CKD (chronic kidney disease), stage III ([place])    pt. states that he does not have CKD  . CML (chronic myelocytic leukemia) ([place])   . Coronary artery disease    a. 09/2013 Cath: LM min irregs, LAD 95p(2.75x20 Promus Premier DES & 3.0x20 Promus Premier DES overlapping), D1  50-60, LCX 30p, OM1 88m, OM2 60-70p, RCA small, nl.  . Diverticulitis   . Dysphonia 01/08/2017  . History of hiatal hernia   . History of kidney stones   . History of small bowel obstruction   . Hyperlipidemia   . Ischemic cardiomyopathy    a. 07/2013 Echo: EF 35-40%, apical septal, apical lat, apical AK, mod MR.  . Small bowel perforation ([place])    a. 11/2012 s/p emergent SB resection.  . 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    Patient Active Problem List   Diagnosis Date Noted  . Status post exploratory laparotomy 11/09/2017  . 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 Bilateral rales   . 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 Cough 08/17/2016  . Carotid stenosis 09/17/2015  . Right carotid bruit 12/14/2014  . Unstable angina ([place]) 10/10/2013  . Ischemic cardiomyopathy   . Hyperlipidemia   . Chronic systolic CHF (congestive heart failure) ([place]) 10/03/2013  . CAD (coronary artery disease) 10/03/2013  . Acute systolic heart failure ([place]) 08/11/2013  . Chronic kidney disease (CKD), stage III (moderate) ([facility]) 08/11/2013  . Acute respiratory failure with hypoxia ([place]) 08/09/2013  . Pulmonary edema 08/09/2013  . Small bowel obstruction ([place]) 08/09/2013    Past Surgical History:  Procedure Laterality Date  . CARDIAC CATHETERIZATION  10/09/2013  . CAROTID ENDARTERECTOMY    . COLON SURGERY  11/2012   BOWEL SURGERY   . CORONARY ANGIOPLASTY  10/09/2013   MID LAD  . ENDARTERECTOMY Right 09/17/2015   Procedure: Right Carotid ENDARTERECTOMY with Rueben Bash Patch  Angioplasty;  Surgeon: Serafina Mitchell, MD;  Location: [facility];  Service: Vascular;  Laterality: Right;  . EYE SURGERY Bilateral   . INGUINAL HERNIA REPAIR Bilateral   . LAPAROSCOPIC CHOLECYSTECTOMY    . LAPAROTOMY N/A 10/14/2017   Procedure: EXPLORATORY LAPAROTOMY for SMALL BOWEL OBSTRUCTION, cecopexy;  Surgeon: Excell Seltzer, MD;  Location: [facility];  Service: General;  Laterality: N/A;  . LEFT HEART CATHETERIZATION WITH CORONARY ANGIOGRAM N/A  10/09/2013   Procedure: LEFT HEART CATHETERIZATION WITH CORONARY ANGIOGRAM;  Surgeon: Larey Dresser, MD;  Location: [facility] CATH LAB;  Service: Cardiovascular;  Laterality: N/A;  . PERCUTANEOUS CORONARY STENT INTERVENTION (PCI-S)  10/09/2013   Procedure: PERCUTANEOUS CORONARY STENT INTERVENTION (PCI-S);  Surgeon: Larey Dresser, MD;  Location: [facility] CATH LAB;  Service: Cardiovascular;;       Home Medications    Prior to Admission medications   Medication Sig Start Date End Date Taking? Authorizing Provider  Ascorbic Acid (VITAMIN C PO) Take 1,000 mg by mouth daily.    Yes [provider]  aspirin EC 81 MG tablet Take 1 tablet (81 mg total) by mouth daily. 10/02/13  Yes Larey Dresser, MD  BOSULIF 100 MG tablet Call Dr. [name], make an appointment and discuss when to resume this medicine. Patient taking differently: Take 400 mg by mouth daily with breakfast. Call Dr. [name], make an appointment and discuss when to resume this medicine. 10/19/17  Yes Earnstine Regal, PA-C  calcium carbonate (CALCIUM 600) 600 MG TABS tablet Resume this as before next week when you are eating better. Patient taking differently: Take 600 mg by mouth daily with breakfast. Resume this as before next week when you are eating better. 10/19/17  Yes Earnstine Regal, PA-C  carvedilol (COREG) 12.5 MG tablet TAKE 1/2 TABLET TWICE DAILY WITH A MEAL Patient taking differently: TAKE 1/2 TABLET (6.25 MG) PO TWICE DAILY WITH A MEAL 04/23/17  Yes Larey Dresser, MD  cephALEXin (KEFLEX) 500 MG capsule Take 500 mg by mouth 2 (two) times daily.  10/30/17  Yes [provider]  [employer] VITAMIN A PO Take 2,400 mcg by mouth daily.   Yes [provider]  cyanocobalamin 1000 MCG tablet Take 1,000 mcg by mouth daily.   Yes [provider]  losartan (COZAAR) 25 MG tablet TAKE 1/2 TABLET EVERY DAY Patient taking differently: TAKE 1/2 TABLET (12.5 MG) PO EVERY DAY 09/04/17  Yes Larey Dresser, MD  Thiamine  Mononitrate (VITAMIN B1 PO) Take 250 mg by mouth daily.   Yes [provider]  acetaminophen (TYLENOL) 325 MG tablet You can take plain Tylenol 2 tablets every 4 hours for mild pain.  You can take the prescribed pain medicine for pain not relieved by plain Tylenol.  DO NOT TAKE MORE THAN 4000 MG OF TYLENOL PER DAY.  IT CAN HARM YOUR LIVER.  TYLENOL (ACETAMINOPHEN) IS ALSO IN YOUR PRESCRIPTION PAIN MEDICATION.  YOU HAVE TO COUNT IT IN YOUR DAILY TOTAL. 10/19/17   Earnstine Regal, PA-C  HYDROcodone-acetaminophen (NORCO/VICODIN) 5-325 MG tablet Take 1 tablet by mouth every 4 (four) hours as needed for moderate pain. 10/19/17   Earnstine Regal, PA-C    Family History Family History  Problem Relation Age of Onset  . Emphysema Father        died @ 58- smoked  . Heart disease Father   . Kidney Stones Father   . Diabetes Mother        died @ 27  . Stroke Mother   .  Head & neck cancer Brother   . Lymphoma Brother   . Colon cancer Neg Hx   . Stomach cancer Neg Hx   . Rectal cancer Neg Hx   . Esophageal cancer Neg Hx   . Liver cancer Neg Hx     Social History Social History   Tobacco Use  . Smoking status: Former Smoker    Packs/day: 1.00    Years: 45.00    Pack years: 45.00    Types: Cigarettes    Last attempt to quit: 06/09/1999    Years since quitting: 18.4  . Smokeless tobacco: Never Used  Substance Use Topics  . Alcohol use: No    Alcohol/week: 0.0 oz  . Drug use: No     Allergies   Percocet [oxycodone-acetaminophen] and Metronidazole   Review of Systems Review of Systems  Constitutional: Negative for chills and fever.  Eyes: Negative for visual disturbance.  Respiratory: Negative for shortness of breath.   Cardiovascular: Negative for chest pain.  Gastrointestinal: Positive for abdominal pain and nausea. Negative for blood in stool, constipation, diarrhea and vomiting.  Genitourinary: Negative for dysuria and testicular pain.  Musculoskeletal: Positive  for back pain (right lower).  Neurological: Negative for weakness and numbness.  All other systems reviewed and are negative.  Physical Exam Updated Vital Signs BP (!) 171/85   Pulse (!) 57   Temp 97.7 F (36.5 C) (Oral)   Resp 19   SpO2 100%   Physical Exam  Constitutional: He appears well-developed and well-nourished. He appears distressed (moderate secondary to pain).  HENT:  Head: Normocephalic and atraumatic.  Eyes: Conjunctivae are normal. Right eye exhibits no discharge. Left eye exhibits no discharge.  Cardiovascular: Normal rate and regular rhythm.  No murmur heard. Pulmonary/Chest: Breath sounds normal. No respiratory distress. He has no wheezes. He has no rales.  Abdominal: Bowel sounds are normal. There is tenderness (mid abdomen from epigastrium to suprapubic region as well as RLQ ). No guarding at this time. There is no CVA tenderness. A hernia is present. Hernia confirmed positive in the left inguinal area (very small, reducible).  Midline laparotomy well healing incision. Healing bruises to left lower abdomen.   Neurological: He is alert.  Clear speech.   Skin: Skin is warm and dry. No rash noted.  Psychiatric: He has a normal mood and affect. His behavior is normal.  Nursing note and vitals reviewed.  ED Treatments / Results  Labs Results for orders placed or performed during the hospital encounter of 11/09/17  Lipase, blood  Result Value Ref Range   Lipase 32 11 - 51 U/L  Comprehensive metabolic panel  Result Value Ref Range   Sodium 138 135 - 145 mmol/L   Potassium 4.3 3.5 - 5.1 mmol/L   Chloride 105 101 - 111 mmol/L   CO2 25 22 - 32 mmol/L   Glucose, Bld 101 (H) 65 - 99 mg/dL   BUN 17 6 - 20 mg/dL   Creatinine, Ser 1.37 (H) 0.61 - 1.24 mg/dL   Calcium 9.3 8.9 - 10.3 mg/dL   Total Protein 8.0 6.5 - 8.1 g/dL   Albumin 4.0 3.5 - 5.0 g/dL   AST 38 15 - 41 U/L   ALT 35 17 - 63 U/L   Alkaline Phosphatase 105 38 - 126 U/L   Total Bilirubin 0.7 0.3 - 1.2  mg/dL   GFR calc non Af Amer 45 (L) >60 mL/min   GFR calc Af Amer 53 (L) >60 mL/min  Anion gap 8 5 - 15  CBC  Result Value Ref Range   WBC 9.7 4.0 - 10.5 K/uL   RBC 4.52 4.22 - 5.81 MIL/uL   Hemoglobin 13.1 13.0 - 17.0 g/dL   HCT 40.2 39.0 - 52.0 %   MCV 88.9 78.0 - 100.0 fL   MCH 29.0 26.0 - 34.0 pg   MCHC 32.6 30.0 - 36.0 g/dL   RDW 15.8 (H) 11.5 - 15.5 %   Platelets 170 150 - 400 K/uL  Urinalysis, Routine w reflex microscopic  Result Value Ref Range   Color, Urine YELLOW YELLOW   APPearance CLEAR CLEAR   Specific Gravity, Urine 1.011 1.005 - 1.030   pH 6.0 5.0 - 8.0   Glucose, UA NEGATIVE NEGATIVE mg/dL   Hgb urine dipstick NEGATIVE NEGATIVE   Bilirubin Urine NEGATIVE NEGATIVE   Ketones, ur NEGATIVE NEGATIVE mg/dL   Protein, ur 100 (A) NEGATIVE mg/dL   Nitrite NEGATIVE NEGATIVE   Leukocytes, UA NEGATIVE NEGATIVE   RBC / HPF 0-5 0 - 5 RBC/hpf   WBC, UA 0-5 0 - 5 WBC/hpf   Bacteria, UA NONE SEEN NONE SEEN   Squamous Epithelial / LPF NONE SEEN NONE SEEN   Mucus PRESENT    Hyaline Casts, UA PRESENT   I-Stat CG4 Lactic Acid, ED  Result Value Ref Range   Lactic Acid, Venous 0.85 0.5 - 1.9 mmol/L   EKG  EKG Interpretation None       Radiology Ct Abdomen Pelvis Wo Contrast  Result Date: 11/09/2017 CLINICAL DATA:  83 year old male with increasing abdominal and epigastric pain. EXAM: CT ABDOMEN AND PELVIS WITHOUT CONTRAST TECHNIQUE: Multidetector CT imaging of the abdomen and pelvis was performed following the standard protocol without IV contrast. COMPARISON:  Abdominal CT dated 10/14/2017 FINDINGS: Evaluation of this exam is limited in the absence of intravenous contrast. Lower chest: There is emphysematous changes of the visualized lung bases. No intra-abdominal free air. There is diffuse mesenteric edema as well as small ascites. Overall increase in the amount of ascites compared to the prior CT. Hepatobiliary: The liver is unremarkable. There is mild dilatation of the  intrahepatic and intrahepatic biliary trees, possibly post cholecystectomy changes. No calcified stone noted in the central CBD. Pancreas: Unremarkable. No pancreatic ductal dilatation or surrounding inflammatory changes. Spleen: Normal in size without focal abnormality. Adrenals/Urinary Tract: The adrenal glands are unremarkable. Mild right and moderate left renal parenchyma atrophy. There is chronic mild left hydronephrosis. There is a 6 mm nonobstructing stone in the inferior pole of the left kidney as well as smaller nonobstructing calculi in the upper pole of the left kidney. A 4 mm distal left ureteral stone as seen on the prior CT. Mild thickened and trabeculated appearance of the bladder wall, likely related to chronic bladder outlet obstruction. Stomach/Bowel: The stomach is decompressed. Surgical clips at the gastroesophageal junction noted. There is sigmoid diverticulosis. There is postsurgical changes of the bowel with anastomotic suture in the right anterior hemiabdomen. The anastomotic suture was seen on the study of 08/07/2017 in the left hemiabdomen. Mildly dilated somewhat fecalized loops of small bowel primarily in the left hemiabdomen. No pneumatosis. Vascular/Lymphatic: Advanced aortoiliac atherosclerotic disease. 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 There is twisting of the mesentery root with twisting of the SMV around the SMA consistent with mesenteric or mid gut volvulus. Diffuse mesenteric edema, similar or slightly increased from prior study. No portal venous gas. Mildly enlarged mesenteric lymph nodes, likely reactive. Reproductive: The prostate and  seminal vesicles are grossly unremarkable. Other: Midline vertical anterior abdominal wall incisional scar. Musculoskeletal: Mild degenerative changes of the spine. No acute osseous pathology. IMPRESSION: 1. 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 Diffuse  mesenteric edema and small ascites, increased from prior CT. No pneumatosis or portal venous gas. No free air or evidence of bowel perforation. Clinical correlation and surgical consult is advised. 2. Chronic left renal atrophy and mild left hydronephrosis. A 4 mm distal left ureteral stone as well as a nonobstructing stone in the inferior pole of the left kidney appears similar to prior CT. 3. Advanced Aortic Atherosclerosis (ICD10-I70.0). 4. 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 Ectatic abdominal aorta at risk for aneurysm development. Recommend followup by ultrasound in 5 years. This recommendation follows ACR consensus guidelines: White Paper of the ACR Incidental Findings Committee II on Vascular Findings. J Am Coll Radiol 2013; 10:789-794. 5. Emphysema. These results were called by telephone at the time of interpretation on 11/09/2017 at 7:52 pm to PA. Priyansh Pry , who verbally acknowledged these results. Electronically Signed   By: Anner Crete M.D.   On: 11/09/2017 19:57    Procedures Procedures (including critical care time) CRITICAL CARE Performed by: Kennith Maes  Total critical care time: 30 minutes  Critical care time was exclusive of separately billable procedures and treating other patients.  Critical care was necessary to treat or prevent imminent or life-threatening deterioration.  Critical care was time spent personally by me on the following activities: development of treatment plan with patient and/or surrogate as well as nursing, discussions with consultants, evaluation of patient's response to treatment, examination of patient, obtaining history from patient or surrogate, ordering and performing treatments and interventions, ordering and review of laboratory studies, ordering and review of radiographic studies, pulse oximetry and re-evaluation of patient's condition.  Medications Ordered in ED Medications  iopamidol (ISOVUE-300) 61 % injection 30 mL (not  administered)  fentaNYL (SUBLIMAZE) injection 50 mcg (50 mcg Intravenous Given 11/09/17 1855)  ondansetron (ZOFRAN) injection 4 mg (4 mg Intravenous Given 11/09/17 1902)  fentaNYL (SUBLIMAZE) injection 50 mcg (50 mcg Intravenous Given 11/09/17 1943)  piperacillin-tazobactam (ZOSYN) IVPB 3.375 g (0 g Intravenous Stopped 11/09/17 2150)  sodium chloride 0.9 % bolus 500 mL (0 mLs Intravenous Stopped 11/09/17 2220)  0.9 %  sodium chloride infusion ( Intravenous Transfusing/Transfer 11/09/17 2315)  fentaNYL (SUBLIMAZE) injection 50 mcg (50 mcg Intravenous Given 11/09/17 2038)  HYDROmorphone (DILAUDID) injection 0.5 mg (0.5 mg Intravenous Given 11/09/17 2128)  HYDROmorphone (DILAUDID) injection 1 mg (1 mg Intravenous Given 11/09/17 2149)  HYDROmorphone (DILAUDID) injection 0.5 mg (0.5 mg Intravenous Given 11/09/17 2301)    Initial Impression / Assessment and Plan / ED Course  I have reviewed the triage vital signs and the nursing notes.  Pertinent labs & imaging results that were available during my care of the patient were reviewed by me and considered in my medical decision making (see chart for details).  Patient presents complaining of abdominal pain started this morning after eating breakfast. Patient is nontoxic appearing with stable vital signs, noted to be hypertensive, appears in moderate distress secondary to pain. Screening labs unremarkable, creatinine elevated consistent with previous and hx of CKD stage III. Recent laparotomy due to midgut volvulus 11/25. Surgical scar is healing well. Will evaluate with CT scan, will avoid IV contrast in setting of CKD at this time, do not think patient will tolerate PO contrast.  Will treat pain with  fentanyl and nausea with zofran at this time. Findings and plan of care discussed with supervising physician Dr. Johnney Killian who personally evaluated and examined this patient.  19:57: Discussed CT scan findings of volvulus with radiologist Dr.  Quintella Reichert  20:00: Consult placed to general surgery  20:05: RE-EVAL: Patient's pain improved following second dose of Fentanyl. Discussed CT scan results and surgical consult with patient and family.   20:07: CONSULT: Supervising physician Dr. Johnney Killian discussed case with general surgeon Dr. Donne Hazel, recommends starting Zosyn and ordering of I-stat lactic. Will come to the emergency department to see the patient.   Additional pain medication ordered for pain control.   22:15: Patient has been taken to the operating room by Dr. Donne Hazel for exlap.     Final Clinical Impressions(s) / ED Diagnoses   Final diagnoses:  Midgut volvulus    ED Discharge Orders    None       Amaryllis Dyke, PA-C 11/09/17 2332    Charlesetta Shanks, MD 11/10/17 [phone]

## 2017-11-09 NOTE — ED Notes (Signed)
Pt transported to OR

## 2017-11-09 NOTE — ED Notes (Signed)
Writer made two unsuccessful attempts to draw blood cultures. 

## 2017-11-10 ENCOUNTER — Other Ambulatory Visit: Payer: Self-pay

## 2017-11-10 ENCOUNTER — Inpatient Hospital Stay (HOSPITAL_COMMUNITY): Payer: Medicare HMO

## 2017-11-10 DIAGNOSIS — N183 Chronic kidney disease, stage 3 unspecified: Secondary | ICD-10-CM | POA: Diagnosis present

## 2017-11-10 DIAGNOSIS — Q433 Congenital malformations of intestinal fixation: Secondary | ICD-10-CM

## 2017-11-10 DIAGNOSIS — E874 Mixed disorder of acid-base balance: Secondary | ICD-10-CM

## 2017-11-10 DIAGNOSIS — K562 Volvulus: Secondary | ICD-10-CM | POA: Diagnosis present

## 2017-11-10 DIAGNOSIS — Z9911 Dependence on respirator [ventilator] status: Secondary | ICD-10-CM

## 2017-11-10 LAB — BLOOD GAS, ARTERIAL
Acid-base deficit: 4.4 mmol/L — ABNORMAL HIGH (ref 0.0–2.0)
Bicarbonate: 21.3 mmol/L (ref 20.0–28.0)
Drawn by: 11249
FIO2: 100
MECHVT: 560 mL
O2 Saturation: 99.4 %
PEEP: 5 cmH2O
Patient temperature: 97.5
RATE: 14 resp/min
pCO2 arterial: 42.5 mmHg (ref 32.0–48.0)
pH, Arterial: 7.317 — ABNORMAL LOW (ref 7.350–7.450)
pO2, Arterial: 354 mmHg — ABNORMAL HIGH (ref 83.0–108.0)

## 2017-11-10 LAB — CBC
HCT: 40.3 % (ref 39.0–52.0)
Hemoglobin: 12.8 g/dL — ABNORMAL LOW (ref 13.0–17.0)
MCH: 29 pg (ref 26.0–34.0)
MCHC: 31.8 g/dL (ref 30.0–36.0)
MCV: 91.2 fL (ref 78.0–100.0)
Platelets: 125 10*3/uL — ABNORMAL LOW (ref 150–400)
RBC: 4.42 MIL/uL (ref 4.22–5.81)
RDW: 15.8 % — ABNORMAL HIGH (ref 11.5–15.5)
WBC: 11.6 10*3/uL — ABNORMAL HIGH (ref 4.0–10.5)

## 2017-11-10 LAB — BASIC METABOLIC PANEL
Anion gap: 9 (ref 5–15)
BUN: 17 mg/dL (ref 6–20)
CO2: 19 mmol/L — ABNORMAL LOW (ref 22–32)
Calcium: 8.4 mg/dL — ABNORMAL LOW (ref 8.9–10.3)
Chloride: 109 mmol/L (ref 101–111)
Creatinine, Ser: 1.17 mg/dL (ref 0.61–1.24)
GFR calc Af Amer: >60 mL/min (ref >60)
GFR calc non Af Amer: 55 mL/min — ABNORMAL LOW (ref >60)
Glucose, Bld: 120 mg/dL — ABNORMAL HIGH (ref 65–99)
Potassium: 4.5 mmol/L (ref 3.5–5.1)
Sodium: 137 mmol/L (ref 135–145)

## 2017-11-10 LAB — MRSA PCR SCREENING: MRSA by PCR: NEGATIVE

## 2017-11-10 LAB — PHOSPHORUS: Phosphorus: 4.1 mg/dL (ref 2.5–4.6)

## 2017-11-10 LAB — TRIGLYCERIDES: Triglycerides: 99 mg/dL (ref <150)

## 2017-11-10 LAB — MAGNESIUM: Magnesium: 1.8 mg/dL (ref 1.7–2.4)

## 2017-11-10 IMAGING — DX DG CHEST 1V
1 series · 1 of 1 positions shown · non-contrast
Comparison: Chest radiograph dated [DATE]

CLINICAL DATA: 87-year-old male with shortness of breath. Status
post intubation.

EXAM:
CHEST 1 VIEW

[chest ap]
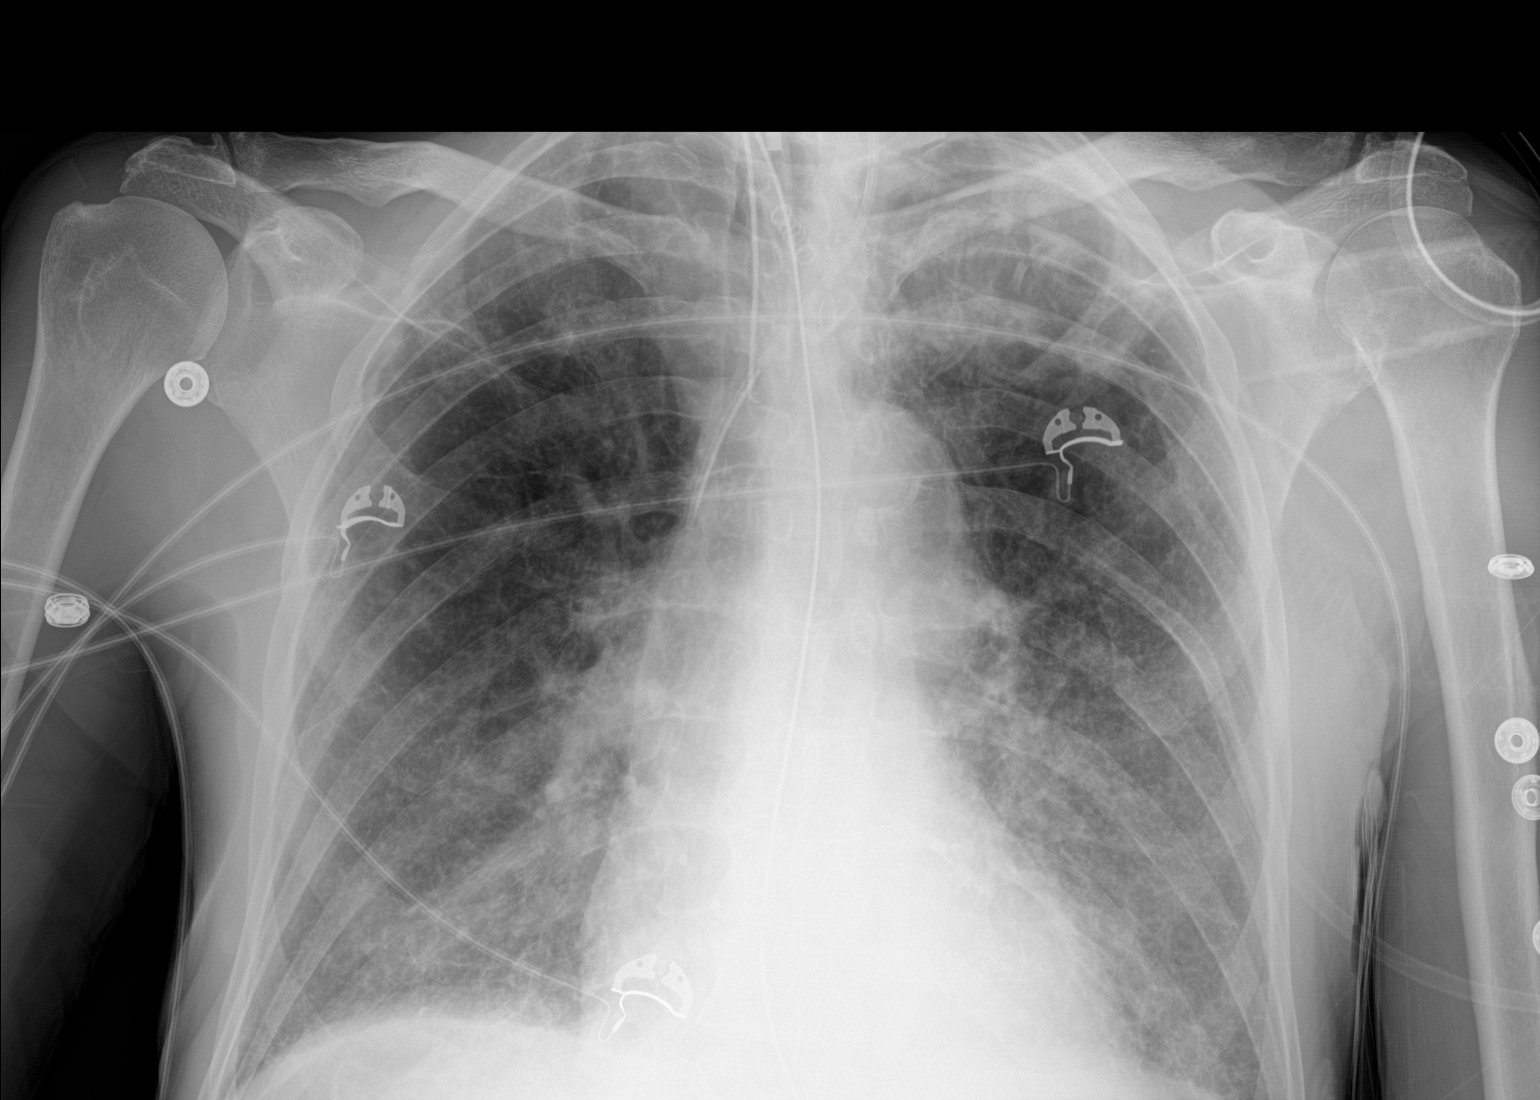

[1 of 1 positions shown; findings below may reference images not displayed]

FINDINGS: An endotracheal tube is noted with tip approximately 4.4 cm above
the carina. Partially visualized enteric tube extends inferiorly
over the mediastinum. Left IJ central venous line with tip over
upper SVC.

Emphysematous changes of the lungs with bilateral mid to lower lung
field interstitial coarsening. No focal consolidation, pleural
effusion, or pneumothorax. The cardiac silhouette is within normal
limits. Atherosclerotic calcification of the thoracic aorta. No
acute osseous pathology.
IMPRESSION: 1. Endotracheal tube above the carina.
2. Emphysema and bilateral mid to lower lung field interstitial
coarsening/ scarring. No focal consolidation or pneumothorax.

## 2017-11-10 MED ORDER — LACTATED RINGERS IV SOLN
INTRAVENOUS | Status: DC | PRN
  Administered 2017-11-10: 01:00:00 via INTRAVENOUS

## 2017-11-10 MED ORDER — ONDANSETRON HCL 4 MG/2ML IJ SOLN: 4.0000 mg | Freq: Four times a day (QID) | INTRAMUSCULAR | Status: DC | PRN

## 2017-11-10 MED ORDER — ALBUTEROL SULFATE (2.5 MG/3ML) 0.083% IN NEBU
2.5000 mg | INHALATION_SOLUTION | RESPIRATORY_TRACT | Status: DC | PRN
  Filled 2017-11-10: qty 3

## 2017-11-10 MED ORDER — FENTANYL CITRATE (PF) 100 MCG/2ML IJ SOLN: 50.0000 ug | INTRAMUSCULAR | Status: DC | PRN

## 2017-11-10 MED ORDER — PANTOPRAZOLE SODIUM 40 MG IV SOLR
40.0000 mg | Freq: Every day | INTRAVENOUS | Status: DC
Start: 2017-11-10 — End: 2017-11-10

## 2017-11-10 MED ORDER — SUCCINYLCHOLINE CHLORIDE 200 MG/10ML IV SOSY
PREFILLED_SYRINGE | INTRAVENOUS | Status: DC | PRN
  Administered 2017-11-10: 120 mg via INTRAVENOUS

## 2017-11-10 MED ORDER — ENOXAPARIN SODIUM 40 MG/0.4ML ~~LOC~~ SOLN
40.0000 mg | Freq: Every day | SUBCUTANEOUS | Status: DC
  Administered 2017-11-10 – 2017-11-13 (×5): 40 mg via SUBCUTANEOUS
  Filled 2017-11-10 (×5): qty 0.4

## 2017-11-10 MED ORDER — PROPOFOL 10 MG/ML IV BOLUS
INTRAVENOUS | Status: DC | PRN
  Administered 2017-11-10: 100 mg via INTRAVENOUS

## 2017-11-10 MED ORDER — IPRATROPIUM-ALBUTEROL 0.5-2.5 (3) MG/3ML IN SOLN
3.0000 mL | Freq: Four times a day (QID) | RESPIRATORY_TRACT | Status: DC
  Administered 2017-11-10 – 2017-11-11 (×7): 3 mL via RESPIRATORY_TRACT
  Filled 2017-11-10 (×6): qty 3

## 2017-11-10 MED ORDER — SODIUM CHLORIDE 0.9 % IV SOLN
INTRAVENOUS | Status: DC | PRN
  Administered 2017-11-09: via INTRAVENOUS

## 2017-11-10 MED ORDER — CARVEDILOL 6.25 MG PO TABS
6.2500 mg | ORAL_TABLET | Freq: Two times a day (BID) | ORAL | Status: DC
  Administered 2017-11-10 – 2017-11-15 (×10): 6.25 mg via ORAL
  Filled 2017-11-10 (×10): qty 1

## 2017-11-10 MED ORDER — PROPOFOL 500 MG/50ML IV EMUL
INTRAVENOUS | Status: DC | PRN
  Administered 2017-11-10: 50 ug/kg/min via INTRAVENOUS

## 2017-11-10 MED ORDER — HYDRALAZINE HCL 20 MG/ML IJ SOLN
10.0000 mg | INTRAMUSCULAR | Status: DC | PRN
  Administered 2017-11-10: 10 mg via INTRAVENOUS
  Filled 2017-11-10: qty 1

## 2017-11-10 MED ORDER — PHENYLEPHRINE HCL 10 MG/ML IJ SOLN
INTRAMUSCULAR | Status: DC | PRN
  Administered 2017-11-10: 25 ug/min via INTRAVENOUS

## 2017-11-10 MED ORDER — LACTATED RINGERS IV SOLN: INTRAVENOUS | Status: AC

## 2017-11-10 MED ORDER — 0.9 % SODIUM CHLORIDE (POUR BTL) OPTIME
TOPICAL | Status: DC | PRN
  Administered 2017-11-10: 4000 mL

## 2017-11-10 MED ORDER — CHLORHEXIDINE GLUCONATE CLOTH 2 % EX PADS
6.0000 | MEDICATED_PAD | Freq: Every day | CUTANEOUS | Status: DC
  Administered 2017-11-13: 6 via TOPICAL

## 2017-11-10 MED ORDER — ORAL CARE MOUTH RINSE: 15.0000 mL | Freq: Four times a day (QID) | OROMUCOSAL | Status: DC

## 2017-11-10 MED ORDER — LIDOCAINE 2% (20 MG/ML) 5 ML SYRINGE
INTRAMUSCULAR | Status: DC | PRN
  Administered 2017-11-10: 60 mg via INTRAVENOUS

## 2017-11-10 MED ORDER — SODIUM CHLORIDE 0.9 % IV SOLN: INTRAVENOUS | Status: DC

## 2017-11-10 MED ORDER — MIDAZOLAM HCL 2 MG/2ML IJ SOLN: 1.0000 mg | INTRAMUSCULAR | Status: DC | PRN

## 2017-11-10 MED ORDER — LACTATED RINGERS IV SOLN
INTRAVENOUS | Status: DC | PRN
  Administered 2017-11-10: via INTRAVENOUS

## 2017-11-10 MED ORDER — PROPOFOL 1000 MG/100ML IV EMUL
5.0000 ug/kg/min | INTRAVENOUS | Status: DC
  Administered 2017-11-10: 40 ug/kg/min via INTRAVENOUS
  Filled 2017-11-10 (×3): qty 100

## 2017-11-10 MED ORDER — METOPROLOL TARTRATE 5 MG/5ML IV SOLN: 5.0000 mg | Freq: Four times a day (QID) | INTRAVENOUS | Status: DC | PRN

## 2017-11-10 MED ORDER — SODIUM CHLORIDE 0.9 % IV SOLN: 250.0000 mL | INTRAVENOUS | Status: DC | PRN

## 2017-11-10 MED ORDER — ROCURONIUM BROMIDE 10 MG/ML (PF) SYRINGE
PREFILLED_SYRINGE | INTRAVENOUS | Status: DC | PRN
  Administered 2017-11-10: 50 mg via INTRAVENOUS

## 2017-11-10 MED ORDER — FENTANYL CITRATE (PF) 100 MCG/2ML IJ SOLN
50.0000 ug | INTRAMUSCULAR | Status: DC | PRN
  Administered 2017-11-10: 25 ug via INTRAVENOUS
  Administered 2017-11-10 – 2017-11-11 (×8): 50 ug via INTRAVENOUS
  Filled 2017-11-10 (×10): qty 2

## 2017-11-10 MED ORDER — FENTANYL CITRATE (PF) 250 MCG/5ML IJ SOLN
INTRAMUSCULAR | Status: DC | PRN
  Administered 2017-11-09 (×2): 50 ug via INTRAVENOUS

## 2017-11-10 MED ORDER — ONDANSETRON 4 MG PO TBDP
4.0000 mg | ORAL_TABLET | Freq: Four times a day (QID) | ORAL | Status: DC | PRN
Start: 2017-11-10 — End: 2017-11-15

## 2017-11-10 MED ORDER — PANTOPRAZOLE SODIUM 40 MG IV SOLR
40.0000 mg | Freq: Every day | INTRAVENOUS | Status: DC
  Administered 2017-11-10 – 2017-11-14 (×6): 40 mg via INTRAVENOUS
  Filled 2017-11-10 (×6): qty 40

## 2017-11-10 MED ORDER — ACETAMINOPHEN 10 MG/ML IV SOLN
1000.0000 mg | Freq: Four times a day (QID) | INTRAVENOUS | Status: AC
Start: 2017-11-10 — End: 2017-11-10
  Administered 2017-11-10 (×4): 1000 mg via INTRAVENOUS
  Filled 2017-11-10 (×4): qty 100

## 2017-11-10 MED ORDER — CHLORHEXIDINE GLUCONATE 0.12% ORAL RINSE (MEDLINE KIT): 15.0000 mL | Freq: Two times a day (BID) | OROMUCOSAL | Status: DC

## 2017-11-10 NOTE — Anesthesia Procedure Notes (Signed)
Procedure Name: Intubation Date/Time: 11/10/2017 12:03 AM Performed by: Cynda Familia, CRNA Pre-anesthesia Checklist: Patient identified, Emergency Drugs available, Suction available and Patient being monitored Patient Re-evaluated:Patient Re-evaluated prior to induction Oxygen Delivery Method: Circle System Utilized Preoxygenation: Pre-oxygenation with 100% oxygen Induction Type: IV induction Ventilation: Mask ventilation without difficulty Laryngoscope Size: Miller and 2 Grade View: Grade I Tube type: Subglottic suction tube Number of attempts: 1 Airway Equipment and Method: Stylet Placement Confirmation: ETT inserted through vocal cords under direct vision,  positive ETCO2 and breath sounds checked- equal and bilateral Secured at: 21 cm Tube secured with: Tape Dental Injury: Teeth and Oropharynx as per pre-operative assessment  Comments: Smooth RSI by Linna Caprice-- intubation AM CRNA atraumatic--- teeth and mouth as preop--- chipped teeth present prior to intubation unchanged after intubation--- bilat BS [place]

## 2017-11-10 NOTE — Consult Note (Signed)
PULMONARY / CRITICAL CARE MEDICINE   Name: [name] [name] MRN: [id number] DOB: 08-05-1930    ADMISSION DATE:  11/09/2017 CONSULTATION DATE: 11/10/2017  REFERRING MD: Dr. Donne Hazel  CHIEF COMPLAINT: Critical care consulted for assistance in ventilator management postoperatively  HISTORY OF PRESENT ILLNESS: 83 year old man admitted to the ICU postoperatively after ex lap with reduction of small bowel volvulus.  He remains intubated and sedated, unresponsive unable to provide HPI  At the time my examination the patient is intubated and sedated and thusly unable to provide history of present illness, review of systems, past medical history, family history, social history, home medications, or allergies. Histories are obtained from family, medical providers, with a medical record.  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  PAST SURGICAL HISTORY: He  has a past surgical history that includes Laparoscopic cholecystectomy; Inguinal hernia repair (Bilateral); Cardiac catheterization (10/09/2013); Coronary angioplasty (10/09/2013); Colon surgery (11/2012); left heart catheterization with coronary angiogram (N/A, 10/09/2013); percutaneous coronary stent intervention (pci-s) (10/09/2013); Eye surgery (Bilateral); Endarterectomy (Right, 09/17/2015); Carotid endarterectomy; and laparotomy (N/A, 10/14/2017).  Allergies  Allergen Reactions  . Percocet [Oxycodone-Acetaminophen] Itching  . Metronidazole Other (See Comments)    Insomnia and nervousness    No current  facility-administered medications on file prior to encounter.    Current Outpatient Medications on File Prior to Encounter  Medication Sig  . Ascorbic Acid (VITAMIN C PO) Take 1,000 mg by mouth daily.   Marland Kitchen aspirin EC 81 MG tablet Take 1 tablet (81 mg total) by mouth daily.  Marland Kitchen BOSULIF 100 MG tablet Call Dr. [name], make an appointment and discuss when to resume this medicine. (Patient taking differently: Take 400 mg by mouth daily with breakfast. Call Dr. [name], make an appointment and discuss when to resume this medicine.)  . calcium carbonate (CALCIUM 600) 600 MG TABS tablet Resume this as before next week when you are eating better. (Patient taking differently: Take 600 mg by mouth daily with breakfast. Resume this as before next week when you are eating better.)  . carvedilol (COREG) 12.5 MG tablet TAKE 1/2 TABLET TWICE DAILY WITH A MEAL (Patient taking differently: TAKE 1/2 TABLET (6.25 MG) PO TWICE DAILY WITH A MEAL)  . cephALEXin (KEFLEX) 500 MG capsule Take 500 mg by mouth 2 (two) times daily.   . CVS VITAMIN A PO Take 2,400 mcg by mouth daily.  . cyanocobalamin 1000 MCG tablet Take 1,000 mcg by mouth daily.  Marland Kitchen losartan (COZAAR) 25 MG tablet TAKE 1/2 TABLET EVERY DAY (Patient taking differently: TAKE 1/2 TABLET (12.5 MG) PO EVERY DAY)  . Thiamine Mononitrate (VITAMIN B1 PO) Take 250 mg by mouth daily.  Marland Kitchen acetaminophen (TYLENOL) 325 MG tablet You can take plain Tylenol 2 tablets every 4 hours for mild pain.  You can take the prescribed pain medicine for pain not relieved by plain Tylenol.  DO NOT TAKE MORE THAN 4000 MG OF TYLENOL PER DAY.  IT CAN HARM YOUR LIVER.  TYLENOL (ACETAMINOPHEN) IS ALSO IN YOUR PRESCRIPTION PAIN MEDICATION.  YOU HAVE TO COUNT IT IN YOUR DAILY TOTAL.  Marland Kitchen HYDROcodone-acetaminophen (NORCO/VICODIN) 5-325 MG tablet Take 1 tablet by mouth every 4 (four) hours  as needed for moderate pain.    FAMILY HISTORY:  His indicated that his mother is deceased. He indicated that his  father is deceased. He indicated that the status of his brother is unknown. He indicated that the status of his neg hx is unknown.   SOCIAL HISTORY: He  reports that he quit smoking about 18 years ago. His smoking use included cigarettes. He has a 45.00 pack-year smoking history. he has never used smokeless tobacco. He reports that he does not drink alcohol or use drugs.   VITAL SIGNS: BP (!) 193/96   Pulse (!) 56   Temp 97.7 F (36.5 C) (Oral)   Resp 16   Ht 5\' 9"  (1.753 m)   Wt 123 lb 0.3 oz (55.8 kg)   SpO2 92%   BMI 18.17 kg/m   HEMODYNAMICS:    VENTILATOR SETTINGS: PRBC-560/14/5/40    INTAKE / OUTPUT: No intake/output data recorded.  PHYSICAL EXAMINATION: General: Appears underweight Neuro: Responsive to noxious stimuli HEENT: AT [place], EOMI, PERRLA Cardiovascular: Regular rate and rhythm no murmur no cyanosis no edema capillary refill less than 2 seconds Lungs: Clear to auscultation Abdomen: Ex lap incision with clean dry intact dressing absent bowel sounds abdomen soft Musculoskeletal: No red warm swollen tender deformed joints Skin: Warm and dry no rash  LABS:  BMET Recent Labs  Lab 11/09/17 1732  NA 138  K 4.3  CL 105  CO2 25  BUN 17  CREATININE 1.37*  GLUCOSE 101*    Electrolytes Recent Labs  Lab 11/09/17 1732  CALCIUM 9.3    CBC Recent Labs  Lab 11/09/17 1732  WBC 9.7  HGB 13.1  HCT 40.2  PLT 170    Coag's No results for input(s): APTT, INR in the last 168 hours.  Sepsis Markers Recent Labs  Lab 11/09/17 2105  LATICACIDVEN 0.85    ABG Recent Labs  Lab 11/10/17 0138  PHART 7.317*  PCO2ART 42.5  PO2ART 354*    Liver Enzymes Recent Labs  Lab 11/09/17 1732  AST 38  ALT 35  ALKPHOS 105  BILITOT 0.7  ALBUMIN 4.0    Cardiac Enzymes No results for input(s): TROPONINI, PROBNP in the last 168 hours.  Glucose No results for input(s): GLUCAP in the last 168 hours.  Imaging Ct Abdomen Pelvis Wo Contrast  Result  Date: 11/09/2017 CLINICAL DATA:  83 year old male with increasing abdominal and epigastric pain. EXAM: CT ABDOMEN AND PELVIS WITHOUT CONTRAST TECHNIQUE: Multidetector CT imaging of the abdomen and pelvis was performed following the standard protocol without IV contrast. COMPARISON:  Abdominal CT dated 10/14/2017 FINDINGS: Evaluation of this exam is limited in the absence of intravenous contrast. Lower chest: There is emphysematous changes of the visualized lung bases. No intra-abdominal free air. There is diffuse mesenteric edema as well as small ascites. Overall increase in the amount of ascites compared to the prior CT. Hepatobiliary: The liver is unremarkable. There is mild dilatation of the intrahepatic and intrahepatic biliary trees, possibly post cholecystectomy changes. No calcified stone noted in the central CBD. Pancreas: Unremarkable. No pancreatic ductal dilatation or surrounding inflammatory changes. Spleen: Normal in size without focal abnormality. Adrenals/Urinary Tract: The adrenal glands are unremarkable. Mild right and moderate left renal parenchyma atrophy. There is chronic mild left hydronephrosis. There is a 6 mm nonobstructing stone in the inferior pole of the left kidney as well as smaller nonobstructing calculi in the upper pole of the left kidney. A 4 mm distal left ureteral stone as seen on the  prior CT. Mild thickened and trabeculated appearance of the bladder wall, likely related to chronic bladder outlet obstruction. Stomach/Bowel: The stomach is decompressed. Surgical clips at the gastroesophageal junction noted. There is sigmoid diverticulosis. There is postsurgical changes of the bowel with anastomotic suture in the right anterior hemiabdomen. The anastomotic suture was seen on the study of 08/07/2017 in the left hemiabdomen. Mildly dilated somewhat fecalized loops of small bowel primarily in the left hemiabdomen. No pneumatosis. Vascular/Lymphatic: Advanced aortoiliac atherosclerotic  disease. 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 There is twisting of the mesentery root with twisting of the SMV around the SMA consistent with mesenteric or mid gut volvulus. Diffuse mesenteric edema, similar or slightly increased from prior study. No portal venous gas. Mildly enlarged mesenteric lymph nodes, likely reactive. Reproductive: The prostate and seminal vesicles are grossly unremarkable. Other: Midline vertical anterior abdominal wall incisional scar. Musculoskeletal: Mild degenerative changes of the spine. No acute osseous pathology. IMPRESSION: 1. 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 Diffuse mesenteric edema and small ascites, increased from prior CT. No pneumatosis or portal venous gas. No free air or evidence of bowel perforation. Clinical correlation and surgical consult is advised. 2. Chronic left renal atrophy and mild left hydronephrosis. A 4 mm distal left ureteral stone as well as a nonobstructing stone in the inferior pole of the left kidney appears similar to prior CT. 3. Advanced Aortic Atherosclerosis (ICD10-I70.0). 4. 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 Ectatic abdominal aorta at risk for aneurysm development. Recommend followup by ultrasound in 5 years. This recommendation follows ACR consensus guidelines: White Paper of the ACR Incidental Findings Committee II on Vascular Findings. J Am Coll Radiol 2013; 10:789-794. 5. Emphysema. These results were called by telephone at the time of interpretation on 11/09/2017 at 7:52 pm to PA. SAMANTHA PETRUCELLI , who verbally acknowledged these results. Electronically Signed   By: Anner Crete M.D.   On: 11/09/2017 19:57     STUDIES:  CT abdomen and pelvis 12/21>> small bowel volvulus Chest x-ray 12/22> pending for ET tube placement>  CULTURES: 12/22 blood cultures>>  ANTIBIOTICS: 12/22 Zosyn>>  SIGNIFICANT EVENTS: 12/22 excellent reduction of  small bowel volvulus  LINES/TUBES: Left internal jugular central line 12/22>> Intubated 12/22>>  DISCUSSION:   ASSESSMENT / PLAN:  PULMONARY A: Intubated for airway protection Mild iatrogenic respiratory acidosis COPD P:   Ventilator adjusted Nebulizers Daily sedation vacation and spontaneous breathing trial in the morning Follow-up chest x-ray  CARDIOVASCULAR A:  Hypertension Coronary artery disease Ischemic cardiomyopathy chronic systolic CHF EF 37% not decompensated Aortic insufficiency P:  Blood pressure controlled Arterial line in place  RENAL A:   CKD stage III Non-anion gap acidosis P:   Monitor urine output Follow serum creatinine DC Foley as soon as possible Switch fluids to lactated Ringer's  GASTROINTESTINAL A:   Small bowel volvulus status post ex lap with reduction P:   N.p.o. Surgical  management to primary  HEMATOLOGIC A:   DVT prophylaxis  P:  SCDs LMWH  INFECTIOUS A:   Risk of intra-abdominal infection due to volvulus P:   Continue with Zosyn per surgery  ENDOCRINE A:   No active issues P:   Monitor glucose on BMP  NEUROLOGIC A:   Pain sedation P:   RASS goal: -1 Fentanyl, propofol   FAMILY  - Updates: No family available  - Inter-disciplinary family meet or Palliative Care meeting due by: 11/16/16  Upon my evaluation,  this patient had a high probability of imminent or life-threatening deterioration due to intubated for airway protection  high complexity decision making to assess, manipulate, and support vital organ system failure including mechanical ventilation  I have personally provided 30 minutes of critical care time exclusive of time spent on separately billable procedures and education. Time includes review and summation of previous medical record, laboratory data, radiology results, coordination of care with RN  Hanley Hays, MD  [place] Pager: [phone]  11/10/2017, 2:19 AM

## 2017-11-10 NOTE — Anesthesia Procedure Notes (Signed)
Arterial Line Insertion Start/End12/22/2018 12:10 AM Performed by: Cynda Familia, CRNA  Preanesthetic checklist: patient identified, IV checked, site marked, risks and benefits discussed, surgical consent, monitors and equipment checked, pre-op evaluation and timeout performed radial was placed Catheter size: 20 G Hand hygiene performed  and maximum sterile barriers used  Allen's test indicative of satisfactory collateral circulation Attempts: 1 Procedure performed without using ultrasound guided technique. Following insertion, dressing applied and Biopatch. Post procedure assessment: normal and unchanged  Patient tolerated the procedure well with no immediate complications. Additional procedure comments: A line placed to left radial after  induction of GA--- Dr Linna Caprice present assisted and supervised.

## 2017-11-10 NOTE — Progress Notes (Addendum)
1 Day Post-Op   Subjective/Chief Complaint: Intubated, sedated    Objective: Vital signs in last 24 hours: Temp:  [97.6 F (36.4 C)-97.7 F (36.5 C)] 97.6 F (36.4 C) (12/22 0359) Pulse Rate:  [53-60] 54 (12/22 0700) Resp:  [13-19] 15 (12/22 0700) BP: (97-193)/(41-96) 97/41 (12/22 0300) SpO2:  [92 %-100 %] 100 % (12/22 0700) Arterial Line BP: (94-181)/(28-71) 94/29 (12/22 0700) FiO2 (%):  [40 %-100 %] 40 % (12/22 0308) Weight:  [55.8 kg (123 lb 0.3 oz)-59 kg (130 lb)] 55.8 kg (123 lb 0.3 oz) (12/22 0149)    Intake/Output from previous day: 12/21 0701 - 12/22 0700 In: 2011.5 [I.V.:1311.5; IV Piggyback:700] Out: 375 [Urine:325; Blood:50] Intake/Output this shift: No intake/output data recorded.  Resp: coarse bilateral breath sounds Cardio: regular rate and rhythm GI: soft approp tender no bs wound dressed  Lab Results:  Recent Labs    11/09/17 1732 11/10/17 0206  WBC 9.7 11.6*  HGB 13.1 12.8*  HCT 40.2 40.3  PLT 170 125*   BMET Recent Labs    11/09/17 1732 11/10/17 0206  NA 138 137  K 4.3 4.5  CL 105 109  CO2 25 19*  GLUCOSE 101* 120*  BUN 17 17  CREATININE 1.37* 1.17  CALCIUM 9.3 8.4*   PT/INR No results for input(s): LABPROT, INR in the last 72 hours. ABG Recent Labs    11/10/17 0138  PHART 7.317*  HCO3 21.3    Studies/Results: Ct Abdomen Pelvis Wo Contrast  Result Date: 11/09/2017 CLINICAL DATA:  83 year old male with increasing abdominal and epigastric pain. EXAM: CT ABDOMEN AND PELVIS WITHOUT CONTRAST TECHNIQUE: Multidetector CT imaging of the abdomen and pelvis was performed following the standard protocol without IV contrast. COMPARISON:  Abdominal CT dated 10/14/2017 FINDINGS: Evaluation of this exam is limited in the absence of intravenous contrast. Lower chest: There is emphysematous changes of the visualized lung bases. No intra-abdominal free air. There is diffuse mesenteric edema as well as small ascites. Overall increase in the amount  of ascites compared to the prior CT. Hepatobiliary: The liver is unremarkable. There is mild dilatation of the intrahepatic and intrahepatic biliary trees, possibly post cholecystectomy changes. No calcified stone noted in the central CBD. Pancreas: Unremarkable. No pancreatic ductal dilatation or surrounding inflammatory changes. Spleen: Normal in size without focal abnormality. Adrenals/Urinary Tract: The adrenal glands are unremarkable. Mild right and moderate left renal parenchyma atrophy. There is chronic mild left hydronephrosis. There is a 6 mm nonobstructing stone in the inferior pole of the left kidney as well as smaller nonobstructing calculi in the upper pole of the left kidney. A 4 mm distal left ureteral stone as seen on the prior CT. Mild thickened and trabeculated appearance of the bladder wall, likely related to chronic bladder outlet obstruction. Stomach/Bowel: The stomach is decompressed. Surgical clips at the gastroesophageal junction noted. There is sigmoid diverticulosis. There is postsurgical changes of the bowel with anastomotic suture in the right anterior hemiabdomen. The anastomotic suture was seen on the study of 08/07/2017 in the left hemiabdomen. Mildly dilated somewhat fecalized loops of small bowel primarily in the left hemiabdomen. No pneumatosis. Vascular/Lymphatic: Advanced aortoiliac atherosclerotic disease. 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 There is twisting of the mesentery root with twisting of the SMV around the SMA consistent with mesenteric or mid gut volvulus. Diffuse mesenteric edema, similar or slightly increased from prior study. No portal venous gas. Mildly enlarged mesenteric lymph nodes, likely reactive. Reproductive: The prostate and seminal vesicles are grossly unremarkable.  Other: Midline vertical anterior abdominal wall incisional scar. Musculoskeletal: Mild degenerative changes of the spine. No acute osseous pathology. IMPRESSION: 1. Findings consistent  with mesenteric or midgut volvulus with mildly dilated small bowel loops in the left hemiabdomen. Diffuse mesenteric edema and small ascites, increased from prior CT. No pneumatosis or portal venous gas. No free air or evidence of bowel perforation. Clinical correlation and surgical consult is advised. 2. Chronic left renal atrophy and mild left hydronephrosis. A 4 mm distal left ureteral stone as well as a nonobstructing stone in the inferior pole of the left kidney appears similar to prior CT. 3. Advanced Aortic Atherosclerosis (ICD10-I70.0). 4. 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 Ectatic abdominal aorta at risk for aneurysm development. Recommend followup by ultrasound in 5 years. This recommendation follows ACR consensus guidelines: White Paper of the ACR Incidental Findings Committee II on Vascular Findings. J Am Coll Radiol 2013; 10:789-794. 5. Emphysema. These results were called by telephone at the time of interpretation on 11/09/2017 at 7:52 pm to PA. SAMANTHA PETRUCELLI , who verbally acknowledged these results. Electronically Signed   By: Anner Crete M.D.   On: 11/09/2017 19:57   Dg Chest 1 View  Result Date: 11/10/2017 CLINICAL DATA:  83 year old male with shortness of breath. Status post intubation. EXAM: CHEST 1 VIEW COMPARISON:  Chest radiograph dated 10/15/2017 FINDINGS: An endotracheal tube is noted with tip approximately 4.4 cm above the carina. Partially visualized enteric tube extends inferiorly over the mediastinum. Left IJ central venous line with tip over upper SVC. Emphysematous changes of the lungs with bilateral mid to lower lung field interstitial coarsening. No focal consolidation, pleural effusion, or pneumothorax. The cardiac silhouette is within normal limits. Atherosclerotic calcification of the thoracic aorta. No acute osseous pathology. IMPRESSION: 1. Endotracheal tube above the carina. 2. Emphysema and bilateral mid to lower lung field interstitial coarsening/ scarring.  No focal consolidation or pneumothorax. Electronically Signed   By: Anner Crete M.D.   On: 11/10/2017 03:10    Anti-infectives: Anti-infectives (From admission, onward)   Start     Dose/Rate Route Frequency Ordered Stop   11/09/17 2015  piperacillin-tazobactam (ZOSYN) IVPB 3.375 g     3.375 g 100 mL/hr over 30 Minutes Intravenous  Once 11/09/17 2009 11/09/17 2150      Assessment/Plan: POD 1 elap, reduction small bowel volvulus  1. Appreciate ccm care- hopefully can extubate today 2. Continue iv pain control 3. Needs ng tube to remain if extubated, will await bowel function, npo 4. Lovenox, scds 5. Does not need any further abx 6. Can dc foley likely tomorrow 7. Start dressing changes today  Rolm Bookbinder 11/10/2017

## 2017-11-10 NOTE — Progress Notes (Signed)
PULMONARY / CRITICAL CARE MEDICINE   Name: [name] [name] MRN: [id number] DOB: Sep 25, 1930    ADMISSION DATE:  11/09/2017 CONSULTATION DATE: 11/10/2017  REFERRING MD: Dr. Donne Hazel  CHIEF COMPLAINT: Critical care consulted for assistance in ventilator management postoperatively  HISTORY OF PRESENT ILLNESS: 83 year old man admitted to the ICU postoperatively after ex lap with reduction of small bowel volvulus.  He was intubated and sedated, unresponsive unable to provide HPI   Subjective: This morning sedation has been lightened and he is tolerating pressure support ventilation  VITAL SIGNS: BP (!) 97/41   Pulse (!) 57   Temp 98 F (36.7 C) (Axillary)   Resp 10   Ht 5\' 9"  (1.753 m)   Wt 55.8 kg (123 lb 0.3 oz)   SpO2 100%   BMI 18.17 kg/m   HEMODYNAMICS:    VENTILATOR SETTINGS:  Vent Mode: PRVC FiO2 (%):  [40 %-100 %] 40 % Set Rate:  [14 bmp] 14 bmp Vt Set:  [560 mL] 560 mL PEEP:  [5 cmH20] 5 cmH20 Plateau Pressure:  [15 cmH20-17 cmH20] 15 cmH20  INTAKE / OUTPUT: I/O last 3 completed shifts: In: 2011.5 [I.V.:1311.5; IV Piggyback:700] Out: 375 [Urine:325; Blood:50]  PHYSICAL EXAMINATION: General: Thin gentleman intubated, no distress Neuro: Wakes to voice, interacts nods to questions and follows commands HEENT: Endotracheal tube in place, there is some dark blood-tinged material in his subglottic suction tubing Cardiovascular: Regular, no murmur Lungs: Clear bilaterally Abdomen: Abdominal dressing clean and dry, incision not examined, no bowel sounds heard Musculoskeletal: No deformities Skin: No rash  LABS:  BMET Recent Labs  Lab 11/09/17 1732 11/10/17 0206  NA 138 137  K 4.3 4.5  CL 105 109  CO2 25 19*  BUN 17 17  CREATININE 1.37* 1.17  GLUCOSE 101* 120*    Electrolytes Recent Labs  Lab 11/09/17 1732 11/10/17 0206  CALCIUM 9.3 8.4*  MG  --  1.8  PHOS  --  4.1    CBC Recent Labs  Lab 11/09/17 1732 11/10/17 0206  WBC 9.7 11.6*  HGB  13.1 12.8*  HCT 40.2 40.3  PLT 170 125*    Coag's No results for input(s): APTT, INR in the last 168 hours.  Sepsis Markers Recent Labs  Lab 11/09/17 2105  LATICACIDVEN 0.85    ABG Recent Labs  Lab 11/10/17 0138  PHART 7.317*  PCO2ART 42.5  PO2ART 354*    Liver Enzymes Recent Labs  Lab 11/09/17 1732  AST 38  ALT 35  ALKPHOS 105  BILITOT 0.7  ALBUMIN 4.0    Cardiac Enzymes No results for input(s): TROPONINI, PROBNP in the last 168 hours.  Glucose No results for input(s): GLUCAP in the last 168 hours.  Imaging Ct Abdomen Pelvis Wo Contrast  Result Date: 11/09/2017 CLINICAL DATA:  83 year old male with increasing abdominal and epigastric pain. EXAM: CT ABDOMEN AND PELVIS WITHOUT CONTRAST TECHNIQUE: Multidetector CT imaging of the abdomen and pelvis was performed following the standard protocol without IV contrast. COMPARISON:  Abdominal CT dated 10/14/2017 FINDINGS: Evaluation of this exam is limited in the absence of intravenous contrast. Lower chest: There is emphysematous changes of the visualized lung bases. No intra-abdominal free air. There is diffuse mesenteric edema as well as small ascites. Overall increase in the amount of ascites compared to the prior CT. Hepatobiliary: The liver is unremarkable. There is mild dilatation of the intrahepatic and intrahepatic biliary trees, possibly post cholecystectomy changes. No calcified stone noted in the central CBD. Pancreas: Unremarkable. No pancreatic ductal dilatation or  surrounding inflammatory changes. Spleen: Normal in size without focal abnormality. Adrenals/Urinary Tract: The adrenal glands are unremarkable. Mild right and moderate left renal parenchyma atrophy. There is chronic mild left hydronephrosis. There is a 6 mm nonobstructing stone in the inferior pole of the left kidney as well as smaller nonobstructing calculi in the upper pole of the left kidney. A 4 mm distal left ureteral stone as seen on the prior CT.  Mild thickened and trabeculated appearance of the bladder wall, likely related to chronic bladder outlet obstruction. Stomach/Bowel: The stomach is decompressed. Surgical clips at the gastroesophageal junction noted. There is sigmoid diverticulosis. There is postsurgical changes of the bowel with anastomotic suture in the right anterior hemiabdomen. The anastomotic suture was seen on the study of 08/07/2017 in the left hemiabdomen. Mildly dilated somewhat fecalized loops of small bowel primarily in the left hemiabdomen. No pneumatosis. Vascular/Lymphatic: Advanced aortoiliac atherosclerotic disease. 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 There is twisting of the mesentery root with twisting of the SMV around the SMA consistent with mesenteric or mid gut volvulus. Diffuse mesenteric edema, similar or slightly increased from prior study. No portal venous gas. Mildly enlarged mesenteric lymph nodes, likely reactive. Reproductive: The prostate and seminal vesicles are grossly unremarkable. Other: Midline vertical anterior abdominal wall incisional scar. Musculoskeletal: Mild degenerative changes of the spine. No acute osseous pathology. IMPRESSION: 1. 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 Diffuse mesenteric edema and small ascites, increased from prior CT. No pneumatosis or portal venous gas. No free air or evidence of bowel perforation. Clinical correlation and surgical consult is advised. 2. Chronic left renal atrophy and mild left hydronephrosis. A 4 mm distal left ureteral stone as well as a nonobstructing stone in the inferior pole of the left kidney appears similar to prior CT. 3. Advanced Aortic Atherosclerosis (ICD10-I70.0). 4. 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 Ectatic abdominal aorta at risk for aneurysm development. Recommend followup by ultrasound in 5 years. This recommendation follows ACR consensus guidelines: White  Paper of the ACR Incidental Findings Committee II on Vascular Findings. J Am Coll Radiol 2013; 10:789-794. 5. Emphysema. These results were called by telephone at the time of interpretation on 11/09/2017 at 7:52 pm to PA. SAMANTHA PETRUCELLI , who verbally acknowledged these results. Electronically Signed   By: Anner Crete M.D.   On: 11/09/2017 19:57   Dg Chest 1 View  Result Date: 11/10/2017 CLINICAL DATA:  83 year old male with shortness of breath. Status post intubation. EXAM: CHEST 1 VIEW COMPARISON:  Chest radiograph dated 10/15/2017 FINDINGS: An endotracheal tube is noted with tip approximately 4.4 cm above the carina. Partially visualized enteric tube extends inferiorly over the mediastinum. Left IJ central venous line with tip over upper SVC. Emphysematous changes of the lungs with bilateral mid to lower lung field interstitial coarsening. No focal consolidation, pleural effusion, or pneumothorax. The cardiac silhouette is within normal limits. Atherosclerotic calcification of the thoracic aorta. No acute osseous pathology. IMPRESSION: 1. Endotracheal tube above the carina. 2. Emphysema and bilateral mid to lower lung field interstitial coarsening/ scarring. No focal consolidation or pneumothorax. Electronically Signed   By: Anner Crete M.D.   On: 11/10/2017 03:10     STUDIES:  CT abdomen and pelvis 12/21>> small bowel volvulus Chest x-ray 12/22> pending for ET tube placement>  CULTURES: 12/22 blood cultures>>  ANTIBIOTICS: 12/22 Zosyn>>  SIGNIFICANT EVENTS: 12/22 excellent reduction of small bowel volvulus  LINES/TUBES: Left internal jugular  central line 12/22>> Intubated 12/22>> 12/22  DISCUSSION:   ASSESSMENT / PLAN:  PULMONARY A: Intubated for airway protection Mild iatrogenic respiratory acidosis COPD P:   Currently tolerating pressure support and sedation has been decreased No barriers identified based on his surgical findings to extubation, will proceed  if he tolerates pressure support Continue scheduled bronchodilators Intermittent chest x-ray  CARDIOVASCULAR A:  Hypertension Coronary artery disease Ischemic cardiomyopathy chronic systolic CHF EF 76%, not decompensated Aortic insufficiency P:  Continue tight blood pressure control; may need to initiate meds as his sedation is lightened Arterial line in place, continue for now  RENAL A:   CKD stage III Non-anion gap acidosis P:   Follow urine output, BMP Decrease IV fluids  GASTROINTESTINAL A:   Small bowel volvulus status post ex lap with reduction P:   N.p.o. Postoperative management by CCS  HEMATOLOGIC A:   DVT prophylaxis  P:  SCDs LMWH as ordered  INFECTIOUS A:   Risk of intra-abdominal infection due to volvulus P:   Continue Zosyn as ordered Follow culture data as available  ENDOCRINE A:   No active issues P:   Monitor glucose on BMP  NEUROLOGIC A:   Pain sedation P:   RASS goal: -1 to 0 Wean sedation and plan for extubation on 12/22 As needed pain control   FAMILY  - Updates: No family available  Independent CC time 31 minutes  Baltazar Apo, MD, PhD 11/10/2017, 9:32 AM [facility] [phone] or if no answer [phone]

## 2017-11-10 NOTE — Procedures (Signed)
Extubation Procedure Note  Patient Details:   Name: [name] [name] DOB: 03-29-1930 MRN: [id number]   Airway Documentation:  Airway 7.5 mm (Active)  Secured at (cm) 22 cm 11/10/2017  7:54 AM  Measured From Lips 11/10/2017  7:54 AM  [place] 11/10/2017  7:54 AM  Secured By [employer] 11/10/2017  7:54 AM  Tube Holder Repositioned Yes 11/10/2017  7:54 AM  Cuff Pressure (cm H2O) 25 cm H2O 11/10/2017  7:54 AM    Evaluation  O2 sats: 97 Complications: none Patient tolerated procedure well. Bilateral Breath Sounds: Clear   Pt able to speak  [name] 11/10/2017, 10:07 AM

## 2017-11-10 NOTE — Anesthesia Procedure Notes (Signed)
Central Venous Catheter Insertion Performed by: Roberts Gaudy, MD, anesthesiologist Start/End12/22/2018 12:10 AM, 11/10/2017 12:15 AM Preanesthetic checklist: patient identified, IV checked, site marked, risks and benefits discussed, surgical consent, monitors and equipment checked, pre-op evaluation and timeout performed Position: Trendelenburg Hand hygiene performed , maximum sterile barriers used  and Seldinger technique used Catheter size: 8 Fr Central line was placed.Double lumen Procedure performed using ultrasound guided technique. Ultrasound Notes:anatomy identified, needle tip was noted to be adjacent to the nerve/plexus identified and no ultrasound evidence of intravascular and/or intraneural injection Attempts: 1 Following insertion, line sutured, dressing applied and Biopatch. Post procedure assessment: blood return through all ports, free fluid flow and no air  Patient tolerated the procedure well with no immediate complications.

## 2017-11-10 NOTE — Anesthesia Preprocedure Evaluation (Signed)
Anesthesia Evaluation  Patient identified by MRN, date of birth, ID band Patient awake    Reviewed: Allergy & Precautions, NPO status , Patient's Chart, lab work & pertinent test results  Airway Mallampati: II  TM Distance: >3 FB Neck ROM: Full    Dental  (+) Teeth Intact, Dental Advisory Given   Pulmonary former smoker,    breath sounds clear to auscultation + decreased breath sounds      Cardiovascular  Rhythm:Regular Rate:Normal     Neuro/Psych    GI/Hepatic   Endo/Other    Renal/GU      Musculoskeletal   Abdominal (+)  Abdomen: tender.    Peds  Hematology   Anesthesia Other Findings   Reproductive/Obstetrics                             Anesthesia Physical Anesthesia Plan  ASA: IV and emergent  Anesthesia Plan: General   Post-op Pain Management:    Induction: Intravenous, Rapid sequence and Cricoid pressure planned  PONV Risk Score and Plan: Ondansetron and Propofol infusion  Airway Management Planned: Oral ETT  Additional Equipment: Arterial line and CVP  Intra-op Plan:   Post-operative Plan: Post-operative intubation/ventilation  Informed Consent: I have reviewed the patients History and Physical, chart, labs and discussed the procedure including the risks, benefits and alternatives for the proposed anesthesia with the patient or authorized representative who has indicated his/her understanding and acceptance.   Dental advisory given  Plan Discussed with: Anesthesiologist and CRNA  Anesthesia Plan Comments:         Anesthesia Quick Evaluation

## 2017-11-10 NOTE — Op Note (Signed)
Preoperative diagnosis: small bowel volvulus Postoperative diagnosis: saa Procedure: elap with reduction small bowel volvulus Surgeon: Dr Serita Grammes Asst: Dr Georganna Skeans Anesthesia: general Specimens none Drains none ebl 50 cc Complications none  Sponge and needle count correct dispo to icu stable.  Indications: This is an 89 yom who a month ago had small bowel volvulus that he underwent elap for.  He returns today with worse symptoms and ct concerning for volvulus.  I discussed urgent exploration due to concern for ischemic bowel on his exam  Procedure: After informed consent obtained patient taken to or. He was given abx.  He had scds in place.  He was placed under general anesthesia without complication. An ij line and a line were placed by anesthesia.  He had a foley and ng tube placed. He was prepped and draped in standard sterile surgical fashion. Timeout was performed. I reentered his midline incision and went superior to latest one. I entered into the peritoneum sharply. This was done without injury. I then proceeded to open entire incision. There were no adhesions again to anterior abdominal wall. 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  His bowel was dusky but viable.  Just like the last surgery there was an elongated narrow sma pedicle and this was twisted. I rotated this 360 degrees counterclockwise and this resolved the volvulus.  There was some dilated small bowel but all was pink and viable upon completion. He has a number of jejunal diverticula but no other abnormalities. His ligament of treitz is in appropriate position and his cecum was still fixed in right lower quadrant.  He does not have evidence of malrotation. I ran the small bowel several times without any additional abnormalities.  He does have a small bowel anastomosis from prior surgery.  The twist had been corrected and I do not think there is more to do to try to prevent this again. He has absolutely no adhesions  despite multiple surgeries.  I confirmed that the bowel was all in proper orientation and then returned to abdomen.  The abdomen was thoroughly irrigated.  There was no bleeding.  No evidence of injury.  The fascia was closed with running looped #1 PDS begun at either end of the incision and tied centrally. I did place a wet to dry dressing this time. He will remain extubated and transfer to icu. I have discussed with ccm to see him there.

## 2017-11-10 NOTE — Transfer of Care (Signed)
Immediate Anesthesia Transfer of Care Note  Patient: [name] [name]  Procedure(s) Performed: EXPLORATORY LAPAROTOMY reduction small bowel valvulous (N/A Abdomen)  Patient Location: PACU  Anesthesia Type:General  Level of Consciousness: sedated and Patient remains intubated per anesthesia plan  Airway & Oxygen Therapy: Patient remains intubated per anesthesia plan and Patient placed on Ventilator (see vital sign flow sheet for setting)  Post-op Assessment: Report given to RN and Post -op Vital signs reviewed and stable  Post vital signs: Reviewed and stable  Last Vitals:  Vitals:   11/09/17 1930 11/09/17 2214  BP: (!) 179/84 (!) 193/96  Pulse: 60 (!) 56  Resp: 19 16  Temp:    SpO2: 96% 92%    Last Pain:  Vitals:   11/09/17 1916  TempSrc:   PainSc: 1          Complications: No apparent anesthesia complications

## 2017-11-11 ENCOUNTER — Inpatient Hospital Stay (HOSPITAL_COMMUNITY): Payer: Medicare HMO

## 2017-11-11 LAB — BASIC METABOLIC PANEL
Anion gap: 9 (ref 5–15)
BUN: 20 mg/dL (ref 6–20)
CO2: 22 mmol/L (ref 22–32)
Calcium: 8.1 mg/dL — ABNORMAL LOW (ref 8.9–10.3)
Chloride: 107 mmol/L (ref 101–111)
Creatinine, Ser: 1.38 mg/dL — ABNORMAL HIGH (ref 0.61–1.24)
GFR calc Af Amer: 52 mL/min — ABNORMAL LOW (ref >60)
GFR calc non Af Amer: 45 mL/min — ABNORMAL LOW (ref >60)
Glucose, Bld: 72 mg/dL (ref 65–99)
Potassium: 4 mmol/L (ref 3.5–5.1)
Sodium: 138 mmol/L (ref 135–145)

## 2017-11-11 LAB — CBC
HCT: 36 % — ABNORMAL LOW (ref 39.0–52.0)
Hemoglobin: 11.4 g/dL — ABNORMAL LOW (ref 13.0–17.0)
MCH: 28.9 pg (ref 26.0–34.0)
MCHC: 31.7 g/dL (ref 30.0–36.0)
MCV: 91.4 fL (ref 78.0–100.0)
Platelets: 144 10*3/uL — ABNORMAL LOW (ref 150–400)
RBC: 3.94 MIL/uL — ABNORMAL LOW (ref 4.22–5.81)
RDW: 15.9 % — ABNORMAL HIGH (ref 11.5–15.5)
WBC: 12.8 10*3/uL — ABNORMAL HIGH (ref 4.0–10.5)

## 2017-11-11 IMAGING — DX DG CHEST 1V PORT
1 series · 1 of 1 positions shown · non-contrast
Comparison: Yesterday

CLINICAL DATA: History of endotracheal tube

EXAM:
PORTABLE CHEST 1 VIEW

[chest ap]
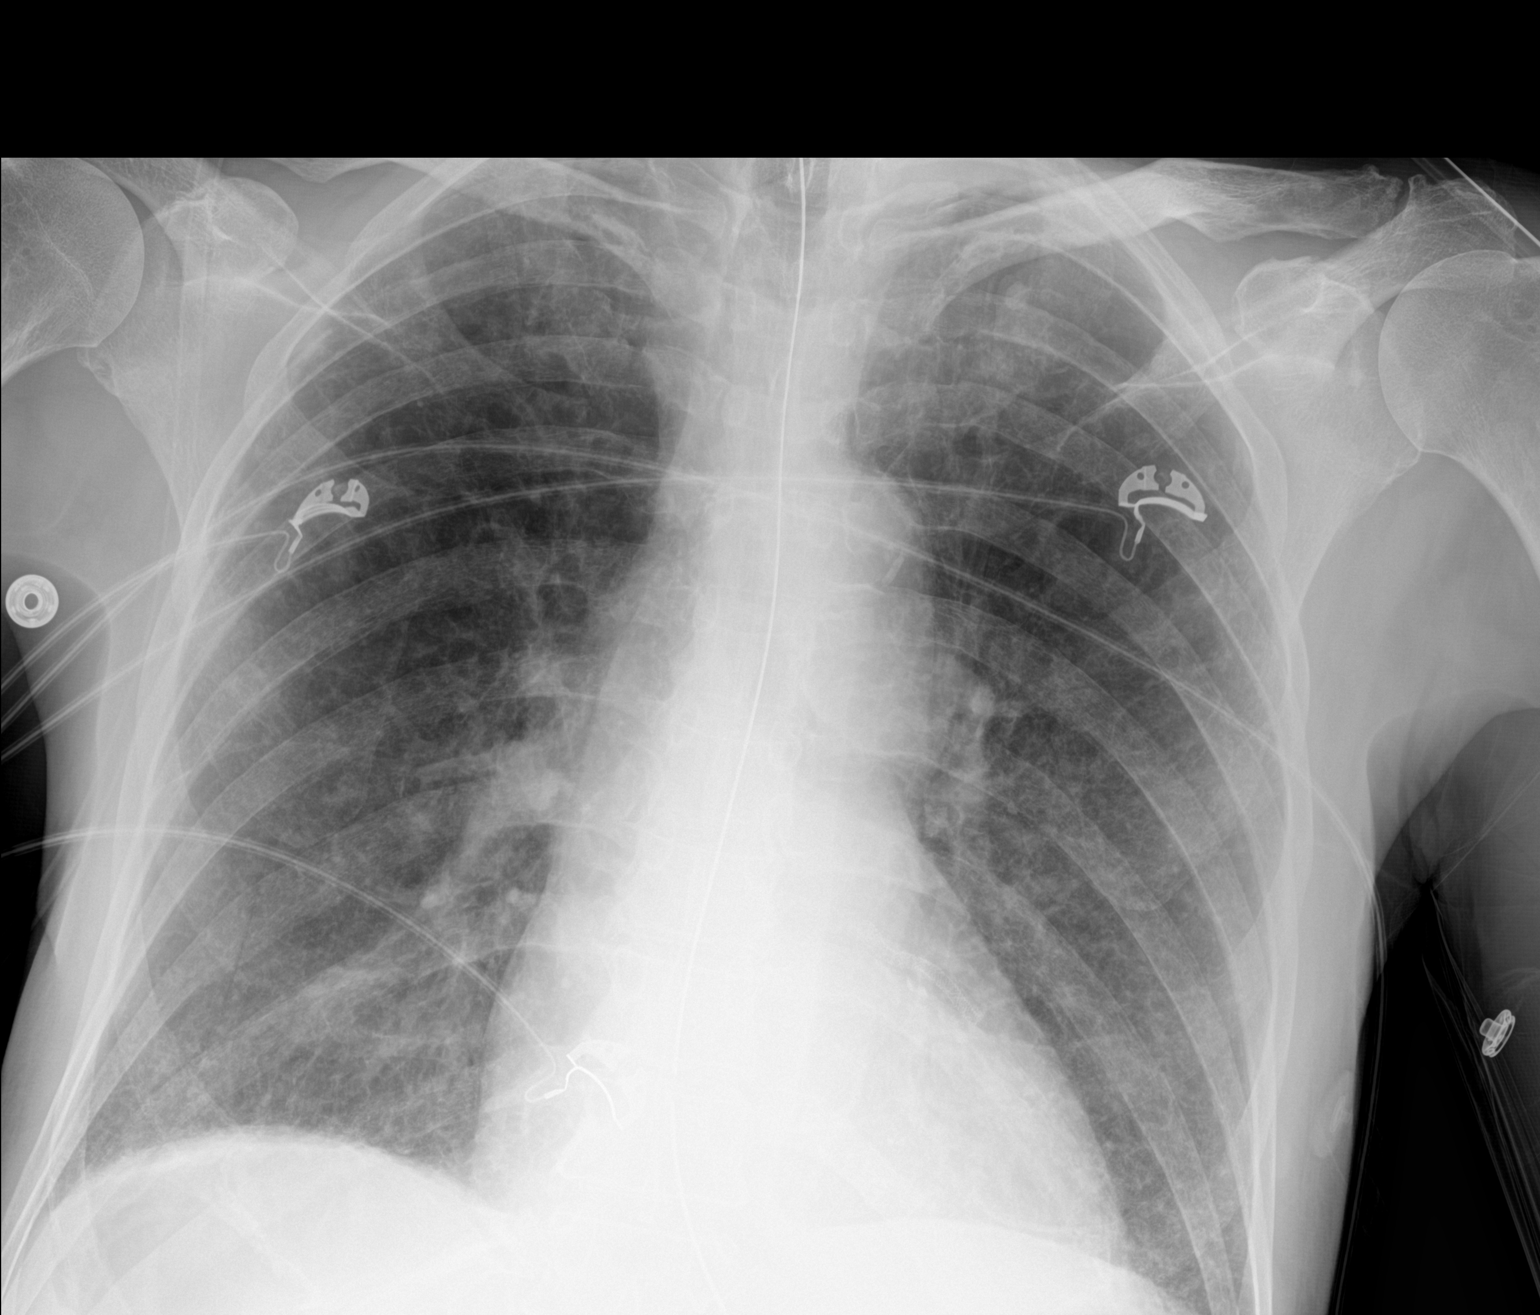

[1 of 1 positions shown; findings below may reference images not displayed]

FINDINGS: Tracheal extubation and left IJ line removal. Large lung volumes and
interstitial coarsening consistent with patient's emphysema. No
definite superimposed disease. Normal heart size. Stable mediastinal
contours. Status post coronary stenting.
IMPRESSION: 1. Stable inflation after extubation.
2. Nasogastric tube with side port in the lower esophagus.
3. Emphysema.

## 2017-11-11 MED ORDER — ONDANSETRON HCL 4 MG/2ML IJ SOLN: 4.0000 mg | Freq: Four times a day (QID) | INTRAMUSCULAR | Status: DC | PRN

## 2017-11-11 MED ORDER — MORPHINE SULFATE 2 MG/ML IV SOLN: INTRAVENOUS | Status: DC

## 2017-11-11 MED ORDER — METHOCARBAMOL 1000 MG/10ML IJ SOLN
500.0000 mg | Freq: Three times a day (TID) | INTRAVENOUS | Status: DC | PRN
  Administered 2017-11-11: 500 mg via INTRAVENOUS
  Filled 2017-11-11: qty 5
  Filled 2017-11-11: qty 550

## 2017-11-11 MED ORDER — NALOXONE HCL 0.4 MG/ML IJ SOLN: 0.4000 mg | INTRAMUSCULAR | Status: DC | PRN

## 2017-11-11 MED ORDER — SODIUM CHLORIDE 0.9% FLUSH: 9.0000 mL | INTRAVENOUS | Status: DC | PRN

## 2017-11-11 MED ORDER — MORPHINE SULFATE (PF) 4 MG/ML IV SOLN
1.0000 mg | INTRAVENOUS | Status: DC | PRN
  Administered 2017-11-11 – 2017-11-13 (×5): 2 mg via INTRAVENOUS
  Administered 2017-11-13: 1 mg via INTRAVENOUS
  Administered 2017-11-15: 2 mg via INTRAVENOUS
  Filled 2017-11-11 (×7): qty 1

## 2017-11-11 MED ORDER — LOSARTAN POTASSIUM 25 MG PO TABS
12.5000 mg | ORAL_TABLET | Freq: Every day | ORAL | Status: DC
  Administered 2017-11-11 – 2017-11-15 (×5): 12.5 mg via ORAL
  Filled 2017-11-11 (×2): qty 1
  Filled 2017-11-11: qty 0.5
  Filled 2017-11-11 (×2): qty 1
  Filled 2017-11-11: qty 0.5

## 2017-11-11 MED ORDER — DIPHENHYDRAMINE HCL 12.5 MG/5ML PO ELIX: 12.5000 mg | ORAL_SOLUTION | Freq: Four times a day (QID) | ORAL | Status: DC | PRN

## 2017-11-11 MED ORDER — SODIUM CHLORIDE 0.9 % IV SOLN
INTRAVENOUS | Status: DC
  Administered 2017-11-11 – 2017-11-15 (×3): via INTRAVENOUS

## 2017-11-11 MED ORDER — DIPHENHYDRAMINE HCL 50 MG/ML IJ SOLN: 12.5000 mg | Freq: Four times a day (QID) | INTRAMUSCULAR | Status: DC | PRN

## 2017-11-11 MED ORDER — ACETAMINOPHEN 10 MG/ML IV SOLN
1000.0000 mg | Freq: Four times a day (QID) | INTRAVENOUS | Status: AC
  Administered 2017-11-11 – 2017-11-12 (×4): 1000 mg via INTRAVENOUS
  Filled 2017-11-11 (×5): qty 100

## 2017-11-11 MED ORDER — IPRATROPIUM-ALBUTEROL 0.5-2.5 (3) MG/3ML IN SOLN
3.0000 mL | Freq: Two times a day (BID) | RESPIRATORY_TRACT | Status: DC
  Administered 2017-11-12: 3 mL via RESPIRATORY_TRACT
  Filled 2017-11-11: qty 3

## 2017-11-11 NOTE — Progress Notes (Signed)
PCCM Interval Note  Extubated and looks good, stable resp status.   Mild leukocytosis, off abx   Please call if we can assist in any way   Baltazar Apo, MD, PhD 11/11/2017, 9:02 AM [facility] [phone] or if no answer [phone]

## 2017-11-11 NOTE — Progress Notes (Signed)
2 Days Post-Op   Subjective/Chief Complaint: Extubated, central line removed without order, he is in pain today, no flatus/bm yet   Objective: Vital signs in last 24 hours: Temp:  [97.4 F (36.3 C)-98.4 F (36.9 C)] 97.4 F (36.3 C) (12/23 0342) Pulse Rate:  [52-72] 63 (12/23 0600) Resp:  [10-24] 16 (12/23 0600) BP: (113-160)/(42-69) 152/61 (12/23 0600) SpO2:  [92 %-100 %] 97 % (12/23 0600) Arterial Line BP: (109-131)/(32-41) 127/40 (12/22 1300) FiO2 (%):  [40 %] 40 % (12/22 2000) Weight:  [57 kg (125 lb 10.6 oz)-57.3 kg (126 lb 5.2 oz)] 57.3 kg (126 lb 5.2 oz) (12/23 0342)    Intake/Output from previous day: 12/22 0701 - 12/23 0700 In: 2311.6 [I.V.:1951.6; IV Piggyback:250] Out: 1400 [Urine:1150; Emesis/NG output:250] Intake/Output this shift: No intake/output data recorded.  General appearance: no distress Resp: clear to auscultation bilaterally Cardio: regular rate and rhythm GI: approp tender wound with some bleeding but nothing active, few bs present  Lab Results:  Recent Labs    11/09/17 1732 11/10/17 0206  WBC 9.7 11.6*  HGB 13.1 12.8*  HCT 40.2 40.3  PLT 170 125*   BMET Recent Labs    11/09/17 1732 11/10/17 0206  NA 138 137  K 4.3 4.5  CL 105 109  CO2 25 19*  GLUCOSE 101* 120*  BUN 17 17  CREATININE 1.37* 1.17  CALCIUM 9.3 8.4*   PT/INR No results for input(s): LABPROT, INR in the last 72 hours. ABG Recent Labs    11/10/17 0138  PHART 7.317*  HCO3 21.3    Studies/Results: Ct Abdomen Pelvis Wo Contrast  Result Date: 11/09/2017 CLINICAL DATA:  83 year old male with increasing abdominal and epigastric pain. EXAM: CT ABDOMEN AND PELVIS WITHOUT CONTRAST TECHNIQUE: Multidetector CT imaging of the abdomen and pelvis was performed following the standard protocol without IV contrast. COMPARISON:  Abdominal CT dated 10/14/2017 FINDINGS: Evaluation of this exam is limited in the absence of intravenous contrast. Lower chest: There is emphysematous  changes of the visualized lung bases. No intra-abdominal free air. There is diffuse mesenteric edema as well as small ascites. Overall increase in the amount of ascites compared to the prior CT. Hepatobiliary: The liver is unremarkable. There is mild dilatation of the intrahepatic and intrahepatic biliary trees, possibly post cholecystectomy changes. No calcified stone noted in the central CBD. Pancreas: Unremarkable. No pancreatic ductal dilatation or surrounding inflammatory changes. Spleen: Normal in size without focal abnormality. Adrenals/Urinary Tract: The adrenal glands are unremarkable. Mild right and moderate left renal parenchyma atrophy. There is chronic mild left hydronephrosis. There is a 6 mm nonobstructing stone in the inferior pole of the left kidney as well as smaller nonobstructing calculi in the upper pole of the left kidney. A 4 mm distal left ureteral stone as seen on the prior CT. Mild thickened and trabeculated appearance of the bladder wall, likely related to chronic bladder outlet obstruction. Stomach/Bowel: The stomach is decompressed. Surgical clips at the gastroesophageal junction noted. There is sigmoid diverticulosis. There is postsurgical changes of the bowel with anastomotic suture in the right anterior hemiabdomen. The anastomotic suture was seen on the study of 08/07/2017 in the left hemiabdomen. Mildly dilated somewhat fecalized loops of small bowel primarily in the left hemiabdomen. No pneumatosis. Vascular/Lymphatic: Advanced aortoiliac atherosclerotic disease. 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 There is twisting of the mesentery root with twisting of the SMV around the SMA consistent with mesenteric or mid gut volvulus. Diffuse mesenteric edema, similar or slightly increased from  prior study. No portal venous gas. Mildly enlarged mesenteric lymph nodes, likely reactive. Reproductive: The prostate and seminal vesicles are grossly unremarkable. Other: Midline vertical  anterior abdominal wall incisional scar. Musculoskeletal: Mild degenerative changes of the spine. No acute osseous pathology. IMPRESSION: 1. 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 Diffuse mesenteric edema and small ascites, increased from prior CT. No pneumatosis or portal venous gas. No free air or evidence of bowel perforation. Clinical correlation and surgical consult is advised. 2. Chronic left renal atrophy and mild left hydronephrosis. A 4 mm distal left ureteral stone as well as a nonobstructing stone in the inferior pole of the left kidney appears similar to prior CT. 3. Advanced Aortic Atherosclerosis (ICD10-I70.0). 4. 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 Ectatic abdominal aorta at risk for aneurysm development. Recommend followup by ultrasound in 5 years. This recommendation follows ACR consensus guidelines: White Paper of the ACR Incidental Findings Committee II on Vascular Findings. J Am Coll Radiol 2013; 10:789-794. 5. Emphysema. These results were called by telephone at the time of interpretation on 11/09/2017 at 7:52 pm to PA. SAMANTHA PETRUCELLI , who verbally acknowledged these results. Electronically Signed   By: Anner Crete M.D.   On: 11/09/2017 19:57   Dg Chest 1 View  Result Date: 11/10/2017 CLINICAL DATA:  83 year old male with shortness of breath. Status post intubation. EXAM: CHEST 1 VIEW COMPARISON:  Chest radiograph dated 10/15/2017 FINDINGS: An endotracheal tube is noted with tip approximately 4.4 cm above the carina. Partially visualized enteric tube extends inferiorly over the mediastinum. Left IJ central venous line with tip over upper SVC. Emphysematous changes of the lungs with bilateral mid to lower lung field interstitial coarsening. No focal consolidation, pleural effusion, or pneumothorax. The cardiac silhouette is within normal limits. Atherosclerotic calcification of the thoracic aorta. No  acute osseous pathology. IMPRESSION: 1. Endotracheal tube above the carina. 2. Emphysema and bilateral mid to lower lung field interstitial coarsening/ scarring. No focal consolidation or pneumothorax. Electronically Signed   By: Anner Crete M.D.   On: 11/10/2017 03:10   Dg Chest Port 1 View  Result Date: 11/11/2017 CLINICAL DATA:  History of endotracheal tube EXAM: PORTABLE CHEST 1 VIEW COMPARISON:  Yesterday FINDINGS: Tracheal extubation and left IJ line removal. Large lung volumes and interstitial coarsening consistent with patient's emphysema. No definite superimposed disease. Normal heart size. Stable mediastinal contours. Status post coronary stenting. IMPRESSION: 1. Stable inflation after extubation. 2. Nasogastric tube with side port in the lower esophagus. 3. Emphysema. Electronically Signed   By: Monte Fantasia M.D.   On: 11/11/2017 06:59    Anti-infectives: Anti-infectives (From admission, onward)   Start     Dose/Rate Route Frequency Ordered Stop   11/09/17 2015  piperacillin-tazobactam (ZOSYN) IVPB 3.375 g     3.375 g 100 mL/hr over 30 Minutes Intravenous  Once 11/09/17 2009 11/09/17 2150      Assessment/Plan: POD 2 elap, reduction small bowel volvulus  Neuro- add pca today, has a lot of pain due to repeat laparotomy a month later, 24 hours tylenol, add prn robaxin CV- continue coreg, restart losartan (cr normal and bp up a little) Pulm- oob, ics GI- hopefully does not occur again, think he needs to eat frequent small meals in future. Will dc ng today, let him have sips Renal- follow cr (normal now) with losartan, dc foley today Proph lovenox scds dispo to floor       Rolm Bookbinder 11/11/2017

## 2017-11-11 NOTE — Anesthesia Postprocedure Evaluation (Signed)
Anesthesia Post Note  Patient: [name] [name]  Procedure(s) Performed: EXPLORATORY LAPAROTOMY reduction small bowel valvulous (N/A Abdomen)     Patient location during evaluation: ICU Anesthesia Type: General Level of consciousness: sedated and patient remains intubated per anesthesia plan Pain management: pain level controlled Vital Signs Assessment: post-procedure vital signs reviewed and stable Respiratory status: patient on ventilator - see flowsheet for VS and patient remains intubated per anesthesia plan Cardiovascular status: blood pressure returned to baseline Anesthetic complications: no    Last Vitals:  Vitals:   11/11/17 0733 11/11/17 0800  BP:    Pulse:    Resp:    Temp:  36.6 C  SpO2: 99%     Last Pain:  Vitals:   11/11/17 0800  TempSrc: Oral  PainSc:                  Angelissa Supan COKER

## 2017-11-11 NOTE — Evaluation (Signed)
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 S/P explap, 11/10/17. Hx of CHF, CKD, CML, CAD, hiatal hernia repair, colectomy.   Clinical Impression  The patient is moving well but limited by abdominal pain. Should progress to return home. Pt admitted with above diagnosis. Pt currently with functional limitations due to the deficits listed below (see PT Problem List).  Pt will benefit from skilled PT to increase their independence and safety with mobility to allow discharge to the venue listed below.       Follow Up Recommendations Home health PT;Supervision - Intermittent    Equipment Recommendations  None recommended by PT    Recommendations for Other Services       Precautions / Restrictions Precautions Precautions: Fall Precaution Comments: abd. surg      Mobility  Bed Mobility   Bed Mobility: Rolling;Sidelying to Sit Rolling: Min guard Sidelying to sit: Mod assist       General bed mobility comments: assist with trunk to guard abdomen  Transfers Overall transfer level: Needs assistance Equipment used: Rolling walker (2 wheeled) Transfers: Sit to/from Stand Sit to Stand: Min assist         General transfer comment: steady assist  Ambulation/Gait Ambulation/Gait assistance: Min assist;+2 safety/equipment Ambulation Distance (Feet): 20 Feet Assistive device: Rolling walker (2 wheeled) Gait Pattern/deviations: Step-through pattern     General Gait Details: steady assist to mobilize in the room. Limited by pain  Stairs            Wheelchair Mobility    Modified Rankin (Stroke Patients Only)       Balance Overall balance assessment: Needs assistance Sitting-balance support: No upper extremity supported;Feet supported Sitting balance-Leahy Scale: Good     Standing balance support: During  functional activity;Bilateral upper extremity supported Standing balance-Leahy Scale: Fair                               Pertinent Vitals/Pain Pain Score: 7  Pain Location: abdomen when coughing Pain Descriptors / Indicators: Grimacing;Operative site guarding Pain Intervention(s): Premedicated before session;Monitored during session    [place] expects to be discharged to:: Private residence Living Arrangements: Spouse/significant other Available Help at Discharge: Family Type of Home: House   Entrance Stairs-Rails: Right Entrance Stairs-Number of Steps: 3 Home Layout: Able to live on main level with bedroom/bathroom;Multi-level Home Equipment: Walker - 2 wheels;Bedside commode      Prior Function Level of Independence: Independent               Hand Dominance        Extremity/Trunk Assessment   Upper Extremity Assessment Upper Extremity Assessment: Generalized weakness    Lower Extremity Assessment Lower Extremity Assessment: Generalized weakness    Cervical / Trunk Assessment Cervical / Trunk Assessment: Normal  Communication   Communication: No difficulties  Cognition Arousal/Alertness: Awake/alert Behavior During Therapy: WFL for tasks assessed/performed Overall Cognitive Status: Within Functional Limits for tasks assessed                                        General Comments      Exercises     Assessment/Plan    PT Assessment Patient needs continued PT services  PT Problem  List Decreased strength;Decreased balance;Decreased mobility;Decreased activity tolerance;Pain;Decreased knowledge of use of DME       PT Treatment Interventions DME instruction;Gait training;Functional mobility training;Therapeutic activities;Balance training;Patient/family education;Therapeutic exercise    PT Goals (Current goals can be found in the Care Plan section)  Acute Rehab PT Goals Patient Stated Goal: less pain.  regain PLOF.  PT Goal Formulation: With patient Time For Goal Achievement: 11/25/18 Potential to Achieve Goals: Good    Frequency Min 2X/week   Barriers to discharge        Co-evaluation               AM-PAC PT "6 Clicks" Daily Activity  Outcome Measure Difficulty turning over in bed (including adjusting bedclothes, sheets and blankets)?: A Little Difficulty moving from lying on back to sitting on the side of the bed? : A Little Difficulty sitting down on and standing up from a chair with arms (e.g., wheelchair, bedside commode, etc,.)?: A Little Help needed moving to and from a bed to chair (including a wheelchair)?: A Little Help needed walking in hospital room?: A Little Help needed climbing 3-5 steps with a railing? : A Lot 6 Click Score: 17    End of Session   Activity Tolerance: Patient limited by pain Patient left: in chair;with call bell/phone within reach;with chair alarm set Nurse Communication: Mobility status PT Visit Diagnosis: Muscle weakness (generalized) (M62.81);Difficulty in walking, not elsewhere classified (R26.2);Pain    Time: 3976-7341 PT Time Calculation (min) (ACUTE ONLY): 27 min   Charges:   PT Evaluation $PT Eval Low Complexity: 1 Low PT Treatments $Gait Training: 8-22 mins   PT G Codes:        {Diana Davenport PT [phone]   Claretha Cooper 11/11/2017, 10:21 AM

## 2017-11-12 LAB — POCT I-STAT 7, (LYTES, BLD GAS, ICA,H+H)
Acid-base deficit: 5 mmol/L — ABNORMAL HIGH (ref 0.0–2.0)
Bicarbonate: 21.9 mmol/L (ref 20.0–28.0)
Calcium, Ion: 1.18 mmol/L (ref 1.15–1.40)
HCT: 35 % — ABNORMAL LOW (ref 39.0–52.0)
Hemoglobin: 11.9 g/dL — ABNORMAL LOW (ref 13.0–17.0)
O2 Saturation: 99 %
Patient temperature: 35.9
Potassium: 4.3 mmol/L (ref 3.5–5.1)
Sodium: 138 mmol/L (ref 135–145)
TCO2: 23 mmol/L (ref 22–32)
pCO2 arterial: 45.1 mmHg (ref 32.0–48.0)
pH, Arterial: 7.288 — ABNORMAL LOW (ref 7.350–7.450)
pO2, Arterial: 170 mmHg — ABNORMAL HIGH (ref 83.0–108.0)

## 2017-11-12 LAB — CBC
HCT: 33.3 % — ABNORMAL LOW (ref 39.0–52.0)
Hemoglobin: 10.5 g/dL — ABNORMAL LOW (ref 13.0–17.0)
MCH: 28.7 pg (ref 26.0–34.0)
MCHC: 31.5 g/dL (ref 30.0–36.0)
MCV: 91 fL (ref 78.0–100.0)
Platelets: 136 10*3/uL — ABNORMAL LOW (ref 150–400)
RBC: 3.66 MIL/uL — ABNORMAL LOW (ref 4.22–5.81)
RDW: 16 % — ABNORMAL HIGH (ref 11.5–15.5)
WBC: 9.1 10*3/uL (ref 4.0–10.5)

## 2017-11-12 LAB — BASIC METABOLIC PANEL
Anion gap: 8 (ref 5–15)
BUN: 22 mg/dL — ABNORMAL HIGH (ref 6–20)
CO2: 21 mmol/L — ABNORMAL LOW (ref 22–32)
Calcium: 8 mg/dL — ABNORMAL LOW (ref 8.9–10.3)
Chloride: 107 mmol/L (ref 101–111)
Creatinine, Ser: 1.39 mg/dL — ABNORMAL HIGH (ref 0.61–1.24)
GFR calc Af Amer: 52 mL/min — ABNORMAL LOW (ref >60)
GFR calc non Af Amer: 45 mL/min — ABNORMAL LOW (ref >60)
Glucose, Bld: 74 mg/dL (ref 65–99)
Potassium: 3.7 mmol/L (ref 3.5–5.1)
Sodium: 136 mmol/L (ref 135–145)

## 2017-11-12 LAB — BRAIN NATRIURETIC PEPTIDE: B Natriuretic Peptide: 594 pg/mL — ABNORMAL HIGH (ref 0.0–100.0)

## 2017-11-12 MED ORDER — IPRATROPIUM-ALBUTEROL 0.5-2.5 (3) MG/3ML IN SOLN: 3.0000 mL | Freq: Four times a day (QID) | RESPIRATORY_TRACT | Status: DC | PRN

## 2017-11-12 NOTE — Progress Notes (Signed)
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 S/P explap, 11/10/17. Hx of CHF, CKD, CML, CAD, hiatal hernia repair, colectomy.     PT Comments    Significant progress with mobility, pt ambulated 170' holding IV pole, no loss of balance. Encouraged pt to ambulate in halls TID, he is safe to do so without assistance.   Follow Up Recommendations  No PT follow up     Equipment Recommendations  None recommended by PT    Recommendations for Other Services       Precautions / Restrictions Precautions Precautions: Fall Precaution Comments: abd. surg Restrictions Weight Bearing Restrictions: No    Mobility  Bed Mobility   Bed Mobility: Rolling;Sidelying to Sit Rolling: Modified independent (Device/Increase time) Sidelying to sit: Modified independent (Device/Increase time);HOB elevated          Transfers Overall transfer level: Needs assistance Equipment used: Rolling walker (2 wheeled) Transfers: Sit to/from Stand Sit to Stand: Modified independent (Device/Increase time)         General transfer comment: used bedrail  Ambulation/Gait Ambulation/Gait assistance: Modified independent (Device/Increase time) Ambulation Distance (Feet): 170 Feet Assistive device: (IV pole in RUE) Gait Pattern/deviations: Step-through pattern   Gait velocity interpretation: at or above normal speed for age/gender General Gait Details: steady holding IV pole, no LOB   Stairs            Wheelchair Mobility    Modified Rankin (Stroke Patients Only)       Balance     Sitting balance-Leahy Scale: Good       Standing balance-Leahy Scale: Good                              Cognition Arousal/Alertness: Awake/alert Behavior During Therapy: WFL for tasks assessed/performed Overall Cognitive Status: Within  Functional Limits for tasks assessed                                        Exercises      General Comments        Pertinent Vitals/Pain Pain Assessment: 0-10 Pain Score: 4  Pain Location: abdomen when coughing Pain Descriptors / Indicators: Grimacing;Operative site guarding Pain Intervention(s): Monitored during session;Other (comment)(encouraged bracing abdomen with pillow)    Home Living                      Prior Function            PT Goals (current goals can now be found in the care plan section)      Frequency    Min 2X/week      PT Plan Discharge plan needs to be updated    Co-evaluation              AM-PAC PT "6 Clicks" Daily Activity  Outcome Measure  Difficulty turning over in bed (including adjusting bedclothes, sheets and blankets)?: A Little Difficulty moving from lying on back to sitting on the side of the bed? : A Little Difficulty sitting down on and standing up from a chair with arms (e.g., wheelchair, bedside commode, etc,.)?: None Help needed moving to and from a bed to chair (including a wheelchair)?: None Help needed walking in hospital room?:  None Help needed climbing 3-5 steps with a railing? : A Little 6 Click Score: 21    End of Session Equipment Utilized During Treatment: Gait belt Activity Tolerance: Patient tolerated treatment well Patient left: in chair;with call bell/phone within reach Nurse Communication: Mobility status PT Visit Diagnosis: Muscle weakness (generalized) (M62.81);Difficulty in walking, not elsewhere classified (R26.2);Pain     Time: 5852-7782 PT Time Calculation (min) (ACUTE ONLY): 17 min  Charges:  $Gait Training: 8-22 mins                    G Codes:          Philomena Doheny 11/12/2017, 9:03 AM [phone]

## 2017-11-12 NOTE — Progress Notes (Signed)
3 Days Post-Op   Subjective/Chief Complaint: Pt wants something to eat   Objective: Vital signs in last 24 hours: Temp:  [98 F (36.7 C)-98.6 F (37 C)] 98.1 F (36.7 C) (12/24 0416) Pulse Rate:  [60-80] 60 (12/24 0416) Resp:  [16-19] 16 (12/24 0416) BP: (136-163)/(51-78) 150/61 (12/24 0416) SpO2:  [92 %-96 %] 94 % (12/24 0818) Weight:  [54 kg (119 lb 0.8 oz)-57.1 kg (125 lb 14.1 oz)] 54 kg (119 lb 0.8 oz) (12/24 0416)    Intake/Output from previous day: 12/23 0701 - 12/24 0700 In: 1977.5 [P.O.:550; I.V.:972.5; IV Piggyback:455] Out: 100 [Urine:100] Intake/Output this shift: No intake/output data recorded.  Incision/Wound:clean  Soft mild distention serous drainage on dressing   Lab Results:  Recent Labs    11/11/17 0659 11/12/17 0428  WBC 12.8* 9.1  HGB 11.4* 10.5*  HCT 36.0* 33.3*  PLT 144* 136*   BMET Recent Labs    11/11/17 0659 11/12/17 0428  NA 138 136  K 4.0 3.7  CL 107 107  CO2 22 21*  GLUCOSE 72 74  BUN 20 22*  CREATININE 1.38* 1.39*  CALCIUM 8.1* 8.0*   PT/INR No results for input(s): LABPROT, INR in the last 72 hours. ABG Recent Labs    11/10/17 0036 11/10/17 0138  PHART 7.288* 7.317*  HCO3 21.9 21.3    Studies/Results: Dg Chest Port 1 View  Result Date: 11/11/2017 CLINICAL DATA:  History of endotracheal tube EXAM: PORTABLE CHEST 1 VIEW COMPARISON:  Yesterday FINDINGS: Tracheal extubation and left IJ line removal. Large lung volumes and interstitial coarsening consistent with patient's emphysema. No definite superimposed disease. Normal heart size. Stable mediastinal contours. Status post coronary stenting. IMPRESSION: 1. Stable inflation after extubation. 2. Nasogastric tube with side port in the lower esophagus. 3. Emphysema. Electronically Signed   By: Monte Fantasia M.D.   On: 11/11/2017 06:59    Anti-infectives: Anti-infectives (From admission, onward)   Start     Dose/Rate Route Frequency Ordered Stop   11/09/17 2015   piperacillin-tazobactam (ZOSYN) IVPB 3.375 g     3.375 g 100 mL/hr over 30 Minutes Intravenous  Once 11/09/17 2009 11/09/17 2150      Assessment/Plan: s/p Procedure(s): EXPLORATORY LAPAROTOMY reduction small bowel valvulous (N/A) Advance diet slowly Ambulating well  LOS: 3 days    [name] [name] 11/12/2017

## 2017-11-13 MED ORDER — BISACODYL 10 MG RE SUPP: 10.0000 mg | Freq: Every day | RECTAL | Status: DC | PRN

## 2017-11-13 NOTE — Progress Notes (Signed)
4 Days Post-Op   Subjective/Chief Complaint: Pt tolerating clears   Objective: Vital signs in last 24 hours: Temp:  [98.6 F (37 C)-98.7 F (37.1 C)] 98.7 F (37.1 C) (12/25 0500) Pulse Rate:  [57-66] 60 (12/25 0500) Resp:  [17-18] 18 (12/25 0500) BP: (131-147)/(61-66) 134/65 (12/25 0500) SpO2:  [94 %-98 %] 98 % (12/25 0500) Weight:  [53.6 kg (118 lb 2.7 oz)] 53.6 kg (118 lb 2.7 oz) (12/25 0500)    Intake/Output from previous day: 12/24 0701 - 12/25 0700 In: 360 [P.O.:360] Out: -  Intake/Output this shift: No intake/output data recorded.  Incision/Wound:clean  Soft min distention serous drainage on dressing   Lab Results:  Recent Labs    11/11/17 0659 11/12/17 0428  WBC 12.8* 9.1  HGB 11.4* 10.5*  HCT 36.0* 33.3*  PLT 144* 136*   BMET Recent Labs    11/11/17 0659 11/12/17 0428  NA 138 136  K 4.0 3.7  CL 107 107  CO2 22 21*  GLUCOSE 72 74  BUN 20 22*  CREATININE 1.38* 1.39*  CALCIUM 8.1* 8.0*   PT/INR No results for input(s): LABPROT, INR in the last 72 hours. ABG No results for input(s): PHART, HCO3 in the last 72 hours.  Invalid input(s): PCO2, PO2  Studies/Results: No results found.  Anti-infectives: Anti-infectives (From admission, onward)   Start     Dose/Rate Route Frequency Ordered Stop   11/09/17 2015  piperacillin-tazobactam (ZOSYN) IVPB 3.375 g     3.375 g 100 mL/hr over 30 Minutes Intravenous  Once 11/09/17 2009 11/09/17 2150      Assessment/Plan: s/p Procedure(s): EXPLORATORY LAPAROTOMY reduction small bowel valvulous (N/A) Advance diet slowly Ambulating well  LOS: 4 days    [name] [name] C. 88/32/5498

## 2017-11-14 LAB — CULTURE, BLOOD (ROUTINE X 2)
Culture: NO GROWTH
Culture: NO GROWTH
Special Requests: ADEQUATE

## 2017-11-14 MED ORDER — ENOXAPARIN SODIUM 30 MG/0.3ML ~~LOC~~ SOLN
30.0000 mg | Freq: Every day | SUBCUTANEOUS | Status: DC
  Administered 2017-11-14: 30 mg via SUBCUTANEOUS
  Filled 2017-11-14: qty 0.3

## 2017-11-14 NOTE — Progress Notes (Signed)
[facility] Office:  [phone] General Surgery Progress Note   LOS: 5 days  POD -  5 Days Post-Op  Chief Complaint: Abdominal pain  Assessment and Plan: 1.  EXPLORATORY LAPAROTOMY reduction small bowel volvulus - 11/10/2017 - [place]  2nd vovlulus - previous one on 10/15/2017 - [place]  Taking full liquids - to advance diet  1A.  Open wound - clean 2.  DVT prophylaxis - Lovenox   Active Problems:   Status post exploratory laparotomy   Small bowel volvulus (HCC)   Acidosis, metabolic, with respiratory acidosis   Encounter for weaning from ventilator ([place])   CKD (chronic kidney disease), stage III ([place])  Subjective:  Doing well. Wants to advance diet. His wife had a AV replacement about 3 weeks ago, then had a stroke post op ... So she has her own problems.  Objective:   Vitals:   11/13/17 2054 11/14/17 0527  BP: (!) 143/70 (!) 141/60  Pulse: 63 (!) 57  Resp: 18 18  Temp: 98.7 F (37.1 C) 98.7 F (37.1 C)  SpO2: 96% 96%     Intake/Output from previous day:  12/25 0701 - 12/26 0700 In: 2537.5 [P.O.:220; I.V.:2317.5] Out: -   Intake/Output this shift:  No intake/output data recorded.   Physical Exam:   General: Thin WM who is alert and oriented.    HEENT: Normal. Pupils equal. .   Lungs: Clear   Abdomen: Soft, mild distention, has BS   Wound: Clean and open   Lab Results:    Recent Labs    11/12/17 0428  WBC 9.1  HGB 10.5*  HCT 33.3*  PLT 136*    BMET   Recent Labs    11/12/17 0428  NA 136  K 3.7  CL 107  CO2 21*  GLUCOSE 74  BUN 22*  CREATININE 1.39*  CALCIUM 8.0*    PT/INR  No results for input(s): LABPROT, INR in the last 72 hours.  ABG  No results for input(s): PHART, HCO3 in the last 72 hours.  Invalid input(s): PCO2, PO2   Studies/Results:  No results found.   Anti-infectives:   Anti-infectives (From admission, onward)   Start     Dose/Rate Route Frequency Ordered Stop   11/09/17 2015   piperacillin-tazobactam (ZOSYN) IVPB 3.375 g     3.375 g 100 mL/hr over 30 Minutes Intravenous  Once 11/09/17 2009 11/09/17 2150      Alphonsa Overall, MD, FACS Pager: [facility] Office: [phone] 11/14/2017

## 2017-11-14 NOTE — Progress Notes (Signed)
Physical Therapy Discharge Patient Details Name: [name] [name] MRN: [id number] DOB: [birth year] Today's Date: 11/14/2017 Time:  -     Patient discharged from PT services secondary to goals met and no further PT needs identified.  Please see latest therapy progress note for current level of functioning and progress toward goals.    Progress and discharge plan discussed with patient and/or caregiver:patient reports that he has ambulated the unit xx4 today.  GP     Claretha Cooper 11/14/2017, 3:56 PM Tresa Endo PT [phone]

## 2017-11-15 LAB — BASIC METABOLIC PANEL
Anion gap: 5 (ref 5–15)
BUN: 12 mg/dL (ref 6–20)
CO2: 25 mmol/L (ref 22–32)
Calcium: 8.2 mg/dL — ABNORMAL LOW (ref 8.9–10.3)
Chloride: 109 mmol/L (ref 101–111)
Creatinine, Ser: 1.25 mg/dL — ABNORMAL HIGH (ref 0.61–1.24)
GFR calc Af Amer: 59 mL/min — ABNORMAL LOW (ref >60)
GFR calc non Af Amer: 51 mL/min — ABNORMAL LOW (ref >60)
Glucose, Bld: 107 mg/dL — ABNORMAL HIGH (ref 65–99)
Potassium: 4.2 mmol/L (ref 3.5–5.1)
Sodium: 139 mmol/L (ref 135–145)

## 2017-11-15 MED ORDER — HYDROCODONE-ACETAMINOPHEN 5-325 MG PO TABS: 1.0000 | ORAL_TABLET | ORAL | 0 refills | Status: DC | PRN

## 2017-11-15 NOTE — Progress Notes (Signed)
[facility]/Trauma Progress Note  6 Days Post-Op   Assessment/Plan 1.  EXPLORATORY LAPAROTOMY reduction small bowel volvulus - 11/10/2017 - [place]             2nd vovlulus - previous one on 10/15/2017 - [place]             tolerating soft diet and having BM's  1A.  Open wound - clean, HH orders placed and case management consulted              Active Problems:   Status post exploratory laparotomy   Small bowel volvulus (HCC)   Acidosis, metabolic, with respiratory acidosis   Encounter for weaning from ventilator ([place])   CKD (chronic kidney disease), stage III ([place])  FEN: soft diet VTE: SCD's, lovenox ID: Zosyn 12/21 once Follow up: Dr. Donne Hazel 2 weeks  DISPO: [facility] orders placed, when case management gets [facility] set up pt can be discharge. Checking creatinine today before discharge     LOS: 6 days    Subjective:  CC: abdominal pain  Mild pain at rest, worse with coughing. Pt is tolerating diet and had a BM this am. No nausea, vomiting, fever, chills, CP or SOB. Ready to go home. States he would prefer HH to help him with his wound.   Objective: Vital signs in last 24 hours: Temp:  [97.9 F (36.6 C)-98.2 F (36.8 C)] 97.9 F (36.6 C) (12/27 0458) Pulse Rate:  [63-66] 63 (12/27 0458) Resp:  [18-20] 18 (12/27 0458) BP: (130-148)/(62-73) 145/62 (12/27 0458) SpO2:  [95 %-98 %] 98 % (12/27 0458) Weight:  [116 lb 10 oz (52.9 kg)] 116 lb 10 oz (52.9 kg) (12/27 0500) Last BM Date: 11/14/17  Intake/Output from previous day: 12/26 0701 - 12/27 0700 In: 1450 [I.V.:1450] Out: -  Intake/Output this shift: No intake/output data recorded.  PE: Gen:  Alert, NAD, pleasant, cooperative Card:  RRR, no M/G/R heard Pulm:  CTA, no W/R/R, rate and effort normal Abd: Soft, not distended, +BS, incision with good base of granulation tissue and no purulent drainage, appears well healing, mild TTP around incision without guarding. Skin: no rashes noted, warm and  dry   Anti-infectives: Anti-infectives (From admission, onward)   Start     Dose/Rate Route Frequency Ordered Stop   11/09/17 2015  piperacillin-tazobactam (ZOSYN) IVPB 3.375 g     3.375 g 100 mL/hr over 30 Minutes Intravenous  Once 11/09/17 2009 11/09/17 2150      Lab Results:  No results for input(s): WBC, HGB, HCT, PLT in the last 72 hours. BMET No results for input(s): NA, K, CL, CO2, GLUCOSE, BUN, CREATININE, CALCIUM in the last 72 hours. PT/INR No results for input(s): LABPROT, INR in the last 72 hours. CMP     Component Value Date/Time   NA 136 11/12/2017 0428   K 3.7 11/12/2017 0428   CL 107 11/12/2017 0428   CO2 21 (L) 11/12/2017 0428   GLUCOSE 74 11/12/2017 0428   BUN 22 (H) 11/12/2017 0428   CREATININE 1.39 (H) 11/12/2017 0428   CREATININE 1.65 (H) 02/28/2016 0958   CALCIUM 8.0 (L) 11/12/2017 0428   PROT 8.0 11/09/2017 1732   ALBUMIN 4.0 11/09/2017 1732   AST 38 11/09/2017 1732   ALT 35 11/09/2017 1732   ALKPHOS 105 11/09/2017 1732   BILITOT 0.7 11/09/2017 1732   GFRNONAA 45 (L) 11/12/2017 0428   GFRAA 52 (L) 11/12/2017 0428   Lipase     Component Value Date/Time   LIPASE  32 11/09/2017 1732    Studies/Results: No results found.    Kalman Drape , [facility] Surgery 11/15/2017, 10:13 AM Pager: [phone]  Agree with above.  Alphonsa Overall, MD, [facility] Surgery Pager: [phone] Office phone:  [phone]

## 2017-11-15 NOTE — Discharge Instructions (Signed)
[facility], [place] [phone]  OPEN ABDOMINAL SURGERY: POST OP INSTRUCTIONS  Always review your discharge instruction sheet given to you by the facility where your surgery was performed.  IF YOU HAVE DISABILITY OR FAMILY LEAVE FORMS, YOU MUST BRING THEM TO THE OFFICE FOR PROCESSING.  PLEASE DO NOT GIVE THEM TO YOUR DOCTOR.  1. A prescription for pain medication may be given to you upon discharge.  Take your pain medication as prescribed, if needed.  If narcotic pain medicine is not needed, then you may take acetaminophen (Tylenol) or ibuprofen (Advil) as needed. 2. Take your usually prescribed medications unless otherwise directed. 3. If you need a refill on your pain medication, please contact your pharmacy. They will contact our office to request authorization.  Prescriptions will not be filled after 5pm or on week-ends. 4. You should follow a light diet the first few days after arrival home, such as soup and crackers, pudding, etc.unless your doctor has advised otherwise. A high-fiber, low fat diet can be resumed as tolerated.   Be sure to include lots of fluids daily. Most patients will experience some swelling and bruising on the chest and neck area.  Ice packs will help.  Swelling and bruising can take several days to resolve 5. Most patients will experience some swelling and bruising in the area of the incision. Ice pack will help. Swelling and bruising can take several days to resolve..  6. It is common to experience some constipation if taking pain medication after surgery.  Increasing fluid intake and taking a stool softener will usually help or prevent this problem from occurring.  A mild laxative (Milk of Magnesia or Miralax) should be taken according to package directions if there are no bowel movements after 48 hours. 7.  You may have steri-strips (small skin tapes) in place directly over the incision.  These strips should be left on the skin for 7-10 days.  If your  surgeon used skin glue on the incision, you may shower in 24 hours.  The glue will flake off over the next 2-3 weeks.  Any sutures or staples will be removed at the office during your follow-up visit. You may find that a light gauze bandage over your incision may keep your staples from being rubbed or pulled. You may shower and replace the bandage daily. 8. ACTIVITIES:  You may resume regular (light) daily activities beginning the next day--such as daily self-care, walking, climbing stairs--gradually increasing activities as tolerated.  You may have sexual intercourse when it is comfortable.  Refrain from any heavy lifting or straining until approved by your doctor. DO NOT lift anything >15lbs for 8 weeks a. You may drive when you no longer are taking prescription pain medication, you can comfortably wear a seatbelt, and you can safely maneuver your car and apply brakes 9. You should see your doctor in the office for a follow-up appointment approximately two weeks after your surgery.  Make sure that you call for this appointment within a day or two after you arrive home to insure a convenient appointment time. OTHER INSTRUCTIONS:  _____________________________________________________________ _____________________________________________________________  WHEN TO CALL YOUR DOCTOR: 1. Fever over 101.0 2. Inability to urinate 3. Nausea and/or vomiting 4. Extreme swelling or bruising 5. Continued bleeding from incision. 6. Increased pain, redness, or drainage from the incision. 7. Difficulty swallowing or breathing 8. Muscle cramping or spasms. 9. Numbness or tingling in hands or feet or around lips.  The clinic staff  is available to answer your questions during regular business hours.  Please dont hesitate to call and ask to speak to one of the nurses if you have concerns.  For further questions, please visit [email or web address]   Soft-Food Meal Plan Follow for 3-5 days after  discharge A soft-food meal plan includes foods that are safe and easy to swallow. This meal plan typically is used:  If you are having trouble chewing or swallowing foods.  As a transition meal plan after only having had liquid meals for a long period.  What do I need to know about the soft-food meal plan? A soft-food meal plan includes tender foods that are soft and easy to chew and swallow. In most cases, bite-sized pieces of food are easier to swallow. A bite-sized piece is about  inch or smaller. Foods in this plan do not need to be ground or pureed. Foods that are very hard, crunchy, or sticky should be avoided. Also, breads, cereals, yogurts, and desserts with nuts, seeds, or fruits should be avoided. What foods can I eat? Grains Rice and wild rice. Moist bread, dressing, pasta, and noodles. Well-moistened dry or cooked cereals, such as farina (cooked wheat cereal), oatmeal, or grits. Biscuits, breads, muffins, pancakes, and waffles that have been well moistened. Vegetables Shredded lettuce. Cooked, tender vegetables, including potatoes without skins. Vegetable juices. Broths or creamed soups made with vegetables that are not stringy or chewy. Strained tomatoes (without seeds). Fruits Canned or well-cooked fruits. Soft (ripe), peeled fresh fruits, such as peaches, nectarines, kiwi, cantaloupe, honeydew melon, and watermelon (without seeds). Soft berries with small seeds, such as strawberries. Fruit juices (without pulp). Meats and Other Protein Sources Moist, tender, lean beef. Mutton. Lamb. Veal. Chicken. [place]. Liver. Ham. Fish without bones. Eggs. Dairy Milk, milk drinks, and cream. Plain cream cheese and cottage cheese. Plain yogurt. Sweets/Desserts Flavored gelatin desserts. Custard. Plain ice cream, frozen yogurt, sherbet, milk shakes, and malts. Plain cakes and cookies. Plain hard candy. Other Butter, margarine (without trans fat), and cooking oils. Mayonnaise. Cream sauces.  Mild spices, salt, and sugar. Syrup, molasses, honey, and jelly. The items listed above may not be a complete list of recommended foods or beverages. Contact your dietitian for more options. What foods are not recommended? Grains Dry bread, toast, crackers that have not been moistened. Coarse or dry cereals, such as bran, granola, and shredded wheat. Tough or chewy crusty breads, such as [place] bread or baguettes. Vegetables Corn. Raw vegetables except shredded lettuce. Cooked vegetables that are tough or stringy. Tough, crisp, fried potatoes and potato skins. Fruits Fresh fruits with skins or seeds or both, such as apples, pears, or grapes. Stringy, high-pulp fruits, such as papaya, pineapple, coconut, or mango. Fruit leather, fruit roll-ups, and all dried fruits. Meats and Other Protein Sources Sausages and hot dogs. Meats with gristle. Fish with bones. Nuts, seeds, and chunky peanut or other nut butters. Sweets/Desserts Cakes or cookies that are very dry or chewy. The items listed above may not be a complete list of foods and beverages to avoid. Contact your dietitian for more information. This information is not intended to replace advice given to you by your health care provider. Make sure you discuss any questions you have with your health care provider. Document Released: 02/13/2008 Document Revised: 04/13/2016 Document Reviewed: 10/03/2013 Elsevier Interactive Patient Education  2017 [place]: - midline dressing to be changed twice daily - supplies: sterile saline, kerlix, scissors, ABD pads, tape  - remove dressing  and all packing carefully, moistening with sterile saline as needed to avoid packing/internal dressing sticking to the wound. - clean edges of skin around the wound with water/gauze, making sure there is no tape debris or leakage left on skin that could cause skin irritation or breakdown. - dampen and clean kerlix with sterile saline and pack wound  from wound base to skin level, making sure to take note of any possible areas of wound tracking, tunneling and packing appropriately. Wound can be packed loosely. Trim kerlix to size if a whole kerlix is not required. - cover wound with a dry ABD pad and secure with tape.  - write the date/time on the dry dressing/tape to better track when the last dressing change occurred. - apply any skin protectant/powder recommended by clinician to protect skin/skin folds. - change dressing as needed if leakage occurs, wound gets contaminated, or patient requests to shower. - patient may shower daily with wound open and following the shower the wound should be dried and a clean dressing placed.

## 2017-11-15 NOTE — Progress Notes (Signed)
Spoke with patient at bedside, discussed [facility] needs. Requested [facility] for [facility] services, they had no availability. Contacted [facility] and they accepted. No DME needs, no further HH needs identified. [phone]

## 2017-11-15 NOTE — Progress Notes (Signed)
Patient remains a&ox4, ambulatory without assist. Dressing remains cdi. Discharge instructions reviewed with patient and son. Questions, cocnerns denied. Hard prescription provided.

## 2017-11-16 ENCOUNTER — Encounter (HOSPITAL_COMMUNITY): Payer: Self-pay | Admitting: General Surgery

## 2017-11-16 NOTE — Discharge Summary (Signed)
[facility]/Trauma Discharge Summary   [name] MRN: [id number] DOB/AGE: 25-Nov-1929 83 y.o.  Admit date: 11/09/2017 Discharge date: 11/16/2017  Admitting Diagnosis: Small bowel volvulus   Discharge Diagnosis Patient Active Problem List   Diagnosis Date Noted  . 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 Acidosis, metabolic, with respiratory acidosis 11/10/2017  . Encounter for weaning from ventilator ([facility]) 11/10/2017  . CKD (chronic kidney disease), stage III ([facility]) 11/10/2017  . Status post exploratory laparotomy 11/09/2017  . 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 Bilateral rales   . 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 Cough 08/17/2016  . Carotid stenosis 09/17/2015  . Right carotid bruit 12/14/2014  . Unstable angina ([place]) 10/10/2013  . Ischemic cardiomyopathy   . Hyperlipidemia   . Chronic systolic CHF (congestive heart failure) ([facility]) 10/03/2013  . CAD (coronary artery disease) 10/03/2013  . Acute systolic heart failure ([facility]) 08/11/2013  . Chronic kidney disease (CKD), stage III (moderate) ([facility]) 08/11/2013  . Acute respiratory failure with hypoxia ([facility]) 08/09/2013  . Pulmonary edema 08/09/2013  . Small bowel obstruction ([facility]) 08/09/2013    Consultants Critical care management  Imaging: No results found.  Procedures Dr. Donne Hazel (11/10/17) - exploratory laparotomy with reduction of small bowel volvulus  HPI: Pt is a 83 yom who a month ago had small bowel volvulus that he underwent ex lap for.  He returns  with worse symptoms and ct concerning for volvulus.   Hospital Course:  Patient was admitted and underwent procedure listed above.  Tolerated procedure well and was transferred to the ICU where he remained intubated. Pt was extubated, central line removed, foley removed and transferred to the floor on POD#2. Wound was packed with wet to dry dressings. Pt had AKI but creatinine was improving prior to discharge. Diet was advanced as tolerated.  On  POD#6, the patient was voiding well, tolerating diet, ambulating well, pain well controlled, vital signs stable, incisions c/d/i and felt stable for discharge home.  Patient will follow up in our office in 2 weeks and knows to call with questions or concerns.  He will call to confirm appointment date/time.    Patient was discharged in good condition.  The [place] Substance controlled database was reviewed prior to prescribing narcotic pain medication to this patient.  Physical Exam: Gen:  Alert, NAD, pleasant, cooperative Card:  RRR, no M/G/R heard Pulm:  CTA, no W/R/R, rate and effort normal Abd: Soft, not distended, +BS, incision with good base of granulation tissue and no purulent drainage, appears well healing, mild TTP around incision without guarding. Skin: no rashes noted, warm and dry   Allergies as of 11/15/2017      Reactions   Percocet [oxycodone-acetaminophen] Itching   Metronidazole Other (See Comments)   Insomnia and nervousness      Medication List    STOP taking these medications   cephALEXin 500 MG capsule Commonly known as:  KEFLEX     TAKE these medications   acetaminophen 325 MG tablet Commonly known as:  TYLENOL You can take plain Tylenol 2 tablets every 4 hours for mild pain.  You can take the prescribed pain medicine for pain not relieved by plain Tylenol.  DO NOT TAKE MORE THAN 4000 MG OF TYLENOL PER DAY.  IT CAN HARM YOUR LIVER.  TYLENOL (ACETAMINOPHEN) IS ALSO IN YOUR PRESCRIPTION PAIN MEDICATION.  YOU HAVE TO COUNT IT IN YOUR DAILY TOTAL.   aspirin EC 81 MG tablet Take 1 tablet (81 mg total) by mouth  daily.   BOSULIF 100 MG tablet Generic drug:  bosutinib Call Dr. [name], make an appointment and discuss when to resume this medicine. What changed:    how much to take  how to take this  when to take this  additional instructions   calcium carbonate 600 MG Tabs tablet Commonly known as:  CALCIUM 600 Resume this as before next week when  you are eating better. What changed:    how much to take  how to take this  when to take this  additional instructions   carvedilol 12.5 MG tablet Commonly known as:  COREG TAKE 1/2 TABLET TWICE DAILY WITH A MEAL What changed:  See the new instructions.   [employer] VITAMIN A PO Take 2,400 mcg by mouth daily.   cyanocobalamin 1000 MCG tablet Take 1,000 mcg by mouth daily.   HYDROcodone-acetaminophen 5-325 MG tablet Commonly known as:  NORCO/VICODIN Take 1 tablet by mouth every 4 (four) hours as needed for moderate pain.   losartan 25 MG tablet Commonly known as:  COZAAR TAKE 1/2 TABLET EVERY DAY What changed:    how much to take  how to take this  when to take this   VITAMIN B1 PO Take 250 mg by mouth daily.   VITAMIN C PO Take 1,000 mg by mouth daily.        Follow-up Information    Rolm Bookbinder, MD. Schedule an appointment as soon as possible for a visit in 2 week(s).   Specialty:  General Surgery Why:  after discharge for follow up Contact information: [address] [place] [address] [phone]        [facility] Follow up.   Specialty:  [place] Why:  nurse to assist with wound care Contact information: [place] [address] [phone]           Signed: [facility] 11/16/2017, 4:05 PM Pager: [phone]  Agree with above.  He has done well.  Alphonsa Overall, MD, [facility] Pager: [phone] Office phone:  [phone]

## 2017-12-13 ENCOUNTER — Other Ambulatory Visit: Payer: Self-pay | Admitting: Cardiology

## 2017-12-17 ENCOUNTER — Ambulatory Visit: Payer: Medicare HMO | Admitting: Gastroenterology

## 2018-01-04 ENCOUNTER — Other Ambulatory Visit: Payer: Self-pay | Admitting: Cardiology

## 2018-01-10 ENCOUNTER — Emergency Department (HOSPITAL_BASED_OUTPATIENT_CLINIC_OR_DEPARTMENT_OTHER): Payer: Medicare HMO

## 2018-01-10 ENCOUNTER — Inpatient Hospital Stay (HOSPITAL_BASED_OUTPATIENT_CLINIC_OR_DEPARTMENT_OTHER)
Admission: EM | Admit: 2018-01-10 | Discharge: 2018-01-16 | DRG: 335 | Disposition: A | Discharge status: Home / Self Care | Payer: Medicare HMO | Attending: Surgery | Admitting: Surgery

## 2018-01-10 ENCOUNTER — Inpatient Hospital Stay (HOSPITAL_COMMUNITY): Payer: Medicare HMO

## 2018-01-10 ENCOUNTER — Emergency Department (HOSPITAL_COMMUNITY): Payer: Medicare HMO | Admitting: Certified Registered Nurse Anesthetist

## 2018-01-10 ENCOUNTER — Encounter (HOSPITAL_COMMUNITY): Admission: EM | Disposition: A | Discharge status: Home / Self Care | Payer: Self-pay | Source: Home / Self Care

## 2018-01-10 ENCOUNTER — Other Ambulatory Visit: Payer: Self-pay

## 2018-01-10 ENCOUNTER — Encounter (HOSPITAL_BASED_OUTPATIENT_CLINIC_OR_DEPARTMENT_OTHER): Payer: Self-pay | Admitting: Emergency Medicine

## 2018-01-10 DIAGNOSIS — Z823 Family history of stroke: Secondary | ICD-10-CM

## 2018-01-10 DIAGNOSIS — Z8249 Family history of ischemic heart disease and other diseases of the circulatory system: Secondary | ICD-10-CM

## 2018-01-10 DIAGNOSIS — J449 Chronic obstructive pulmonary disease, unspecified: Secondary | ICD-10-CM | POA: Diagnosis present

## 2018-01-10 DIAGNOSIS — Z7982 Long term (current) use of aspirin: Secondary | ICD-10-CM | POA: Diagnosis not present

## 2018-01-10 DIAGNOSIS — K660 Peritoneal adhesions (postprocedural) (postinfection): Secondary | ICD-10-CM | POA: Diagnosis present

## 2018-01-10 DIAGNOSIS — E785 Hyperlipidemia, unspecified: Secondary | ICD-10-CM | POA: Diagnosis present

## 2018-01-10 DIAGNOSIS — C9210 Chronic myeloid leukemia, BCR/ABL-positive, not having achieved remission: Secondary | ICD-10-CM | POA: Diagnosis present

## 2018-01-10 DIAGNOSIS — K631 Perforation of intestine (nontraumatic): Secondary | ICD-10-CM

## 2018-01-10 DIAGNOSIS — Z9221 Personal history of antineoplastic chemotherapy: Secondary | ICD-10-CM

## 2018-01-10 DIAGNOSIS — E43 Unspecified severe protein-calorie malnutrition: Secondary | ICD-10-CM | POA: Diagnosis present

## 2018-01-10 DIAGNOSIS — Z87891 Personal history of nicotine dependence: Secondary | ICD-10-CM

## 2018-01-10 DIAGNOSIS — K562 Volvulus: Secondary | ICD-10-CM | POA: Diagnosis present

## 2018-01-10 DIAGNOSIS — I5022 Chronic systolic (congestive) heart failure: Secondary | ICD-10-CM | POA: Diagnosis present

## 2018-01-10 DIAGNOSIS — N183 Chronic kidney disease, stage 3 (moderate): Secondary | ICD-10-CM | POA: Diagnosis present

## 2018-01-10 DIAGNOSIS — I6529 Occlusion and stenosis of unspecified carotid artery: Secondary | ICD-10-CM | POA: Diagnosis present

## 2018-01-10 DIAGNOSIS — Q433 Congenital malformations of intestinal fixation: Secondary | ICD-10-CM

## 2018-01-10 DIAGNOSIS — Z4659 Encounter for fitting and adjustment of other gastrointestinal appliance and device: Secondary | ICD-10-CM

## 2018-01-10 DIAGNOSIS — Z681 Body mass index (BMI) 19 or less, adult: Secondary | ICD-10-CM | POA: Diagnosis not present

## 2018-01-10 DIAGNOSIS — R58 Hemorrhage, not elsewhere classified: Secondary | ICD-10-CM | POA: Diagnosis not present

## 2018-01-10 DIAGNOSIS — I2510 Atherosclerotic heart disease of native coronary artery without angina pectoris: Secondary | ICD-10-CM | POA: Diagnosis present

## 2018-01-10 DIAGNOSIS — I255 Ischemic cardiomyopathy: Secondary | ICD-10-CM | POA: Diagnosis present

## 2018-01-10 DIAGNOSIS — I130 Hypertensive heart and chronic kidney disease with heart failure and stage 1 through stage 4 chronic kidney disease, or unspecified chronic kidney disease: Secondary | ICD-10-CM | POA: Diagnosis present

## 2018-01-10 DIAGNOSIS — Z955 Presence of coronary angioplasty implant and graft: Secondary | ICD-10-CM | POA: Diagnosis not present

## 2018-01-10 HISTORY — PX: LAPAROTOMY: SHX154

## 2018-01-10 LAB — CBC
HCT: 38 % — ABNORMAL LOW (ref 39.0–52.0)
Hemoglobin: 12.1 g/dL — ABNORMAL LOW (ref 13.0–17.0)
MCH: 28.3 pg (ref 26.0–34.0)
MCHC: 31.8 g/dL (ref 30.0–36.0)
MCV: 89 fL (ref 78.0–100.0)
Platelets: 178 10*3/uL (ref 150–400)
RBC: 4.27 MIL/uL (ref 4.22–5.81)
RDW: 17.3 % — ABNORMAL HIGH (ref 11.5–15.5)
WBC: 9.7 10*3/uL (ref 4.0–10.5)

## 2018-01-10 LAB — URINALYSIS, MICROSCOPIC (REFLEX): WBC, UA: NONE SEEN WBC/hpf (ref 0–5)

## 2018-01-10 LAB — COMPREHENSIVE METABOLIC PANEL
ALT: 28 U/L (ref 17–63)
AST: 35 U/L (ref 15–41)
Albumin: 3.6 g/dL (ref 3.5–5.0)
Alkaline Phosphatase: 115 U/L (ref 38–126)
Anion gap: 10 (ref 5–15)
BUN: 16 mg/dL (ref 6–20)
CO2: 22 mmol/L (ref 22–32)
Calcium: 8.6 mg/dL — ABNORMAL LOW (ref 8.9–10.3)
Chloride: 106 mmol/L (ref 101–111)
Creatinine, Ser: 1.16 mg/dL (ref 0.61–1.24)
GFR calc Af Amer: >60 mL/min (ref >60)
GFR calc non Af Amer: 56 mL/min — ABNORMAL LOW (ref >60)
Glucose, Bld: 123 mg/dL — ABNORMAL HIGH (ref 65–99)
Potassium: 3.6 mmol/L (ref 3.5–5.1)
Sodium: 138 mmol/L (ref 135–145)
Total Bilirubin: 0.3 mg/dL (ref 0.3–1.2)
Total Protein: 8 g/dL (ref 6.5–8.1)

## 2018-01-10 LAB — URINALYSIS, ROUTINE W REFLEX MICROSCOPIC
Bilirubin Urine: NEGATIVE
Glucose, UA: NEGATIVE mg/dL
Ketones, ur: NEGATIVE mg/dL
Leukocytes, UA: NEGATIVE
Nitrite: NEGATIVE
Protein, ur: 30 mg/dL — AB
Specific Gravity, Urine: 1.025 (ref 1.005–1.030)
pH: 5.5 (ref 5.0–8.0)

## 2018-01-10 LAB — LIPASE, BLOOD: Lipase: 28 U/L (ref 11–51)

## 2018-01-10 IMAGING — DX DG ABDOMEN 1V
1 series · 1 of 1 positions shown · non-contrast
Comparison: CT [DATE], radiographs [DATE]

CLINICAL DATA: NG tube placement

EXAM:
ABDOMEN - 1 VIEW

[abdomen kub]
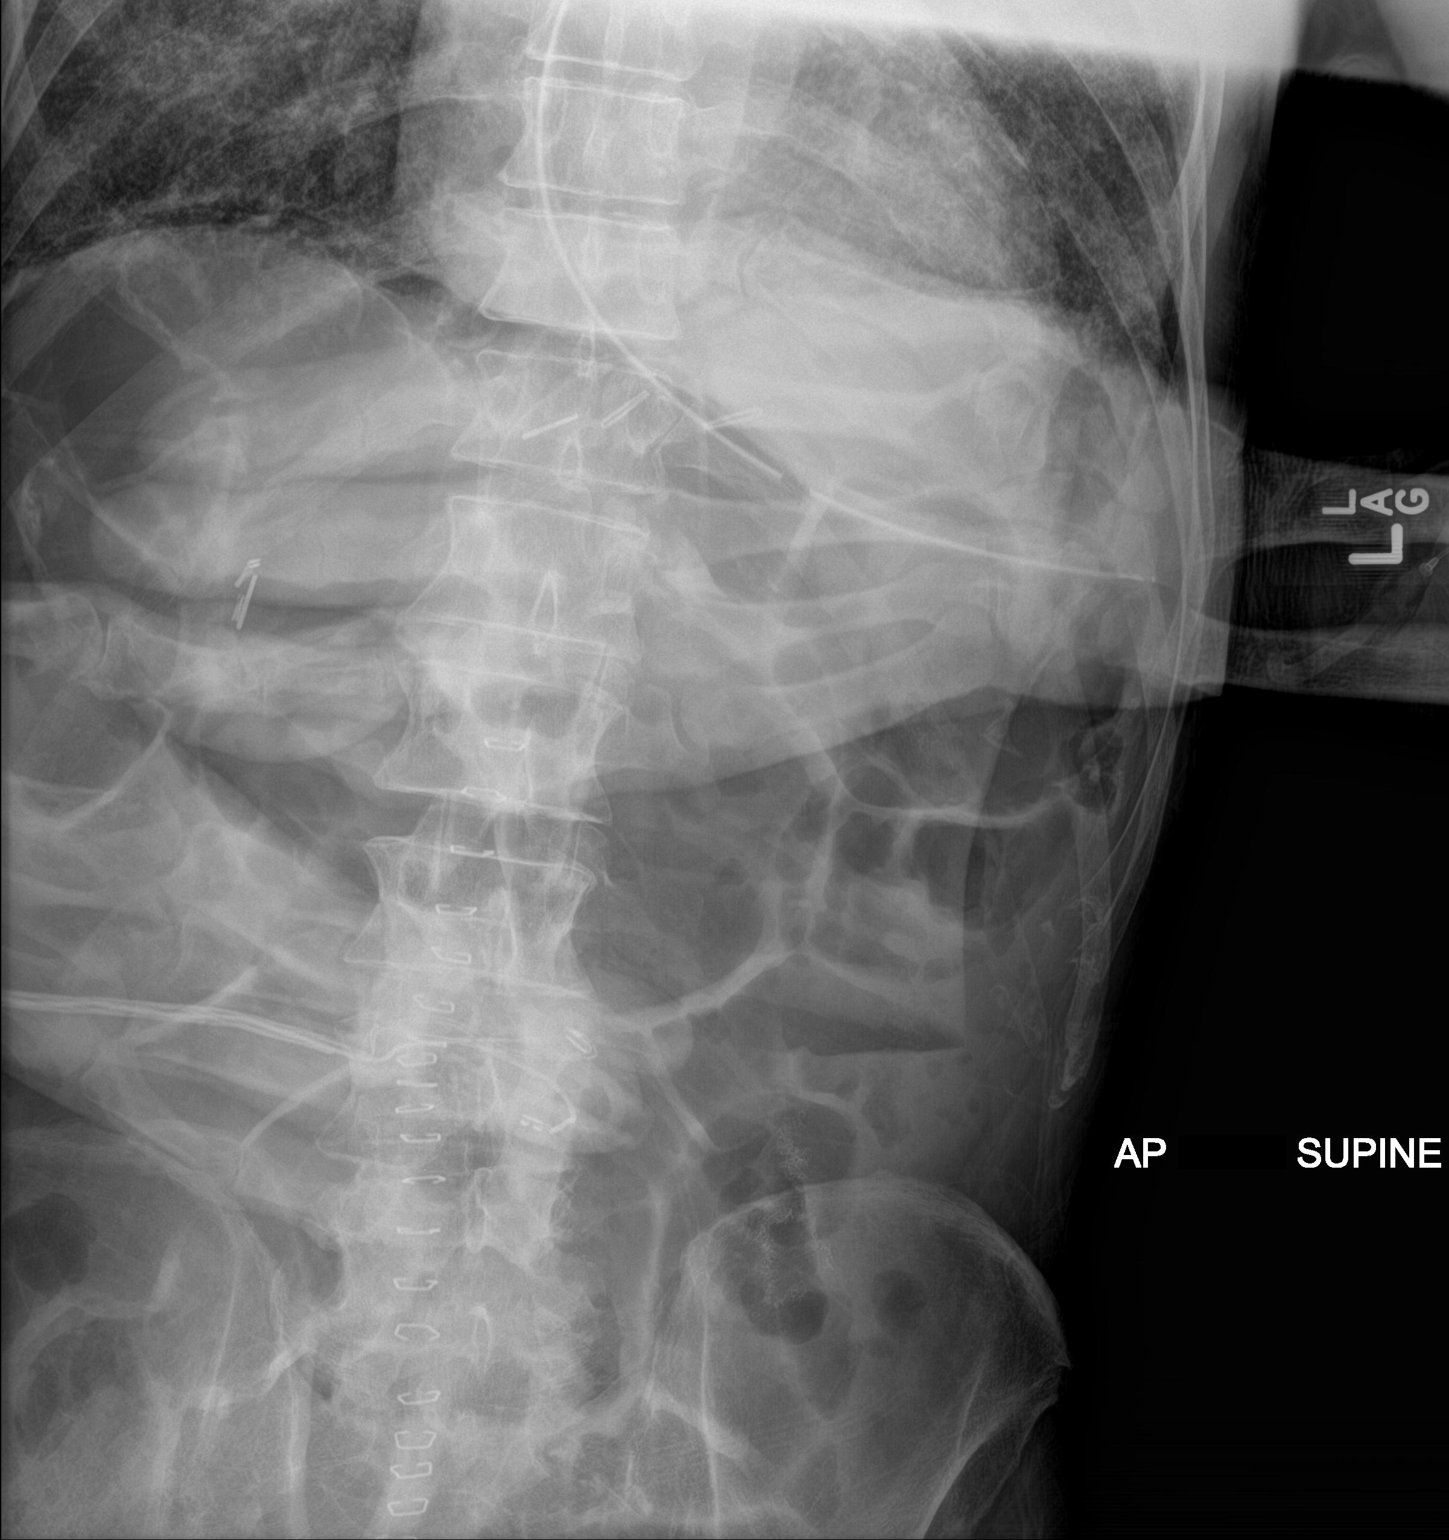

[1 of 1 positions shown; findings below may reference images not displayed]

FINDINGS: Esophageal to tip and side-port project over the proximal stomach.
Gaseous enlargement of small and large bowel. Interval cutaneous
staples at the midline. Probable small amount of postoperative air
in the right upper quadrant.
IMPRESSION: 1. Esophageal tube tip and side-port project over proximal stomach
2. Continued gaseous enlargement of small and large bowel. Suspected
small amount of postoperative air in the right upper quadrant.

## 2018-01-10 IMAGING — CT CT ABD-PELV W/ CM
2 of 5 series · 15 of 46 positions shown, 17 images · IV contrast (APPLIED)
Comparison: CT from [DATE]

CLINICAL DATA: Abnormal abdomen radiograph suspicious for partial
or early SBO and possible perforated hollow viscus.

EXAM:
CT ABDOMEN AND PELVIS WITH CONTRAST
TECHNIQUE: Multidetector CT imaging of the abdomen and pelvis was performed
using the standard protocol following bolus administration of
intravenous contrast.
CONTRAST:  100mL [YS] IOPAMIDOL ([YS]) INJECTION 61%

[Series 2: axial st · axial · 0.69mm/px · z∈[+525,+925]mm · 12 of 91 slices shown, 14 images]
[im 6/91  soft-tissue]
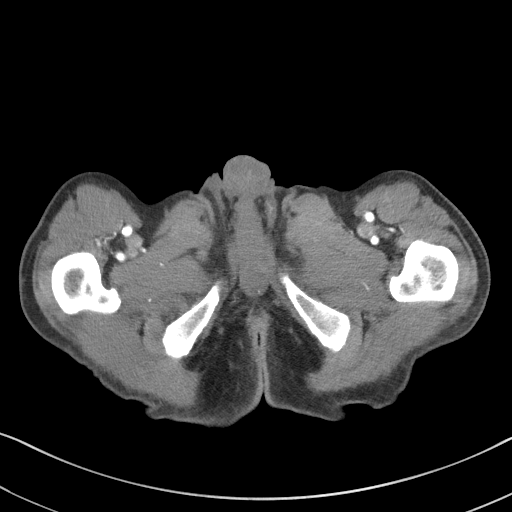
[im 6/91  bone]
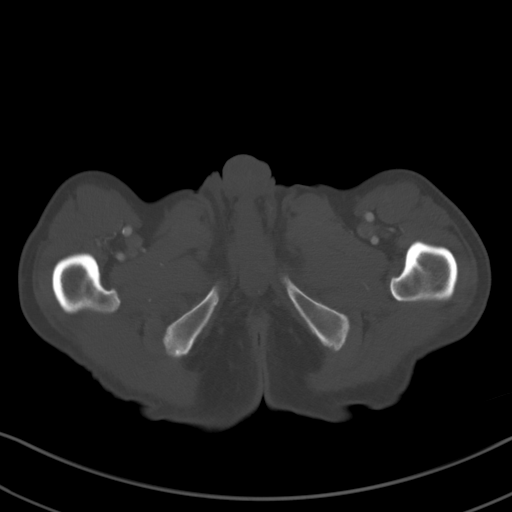
[im 16/91  soft-tissue]
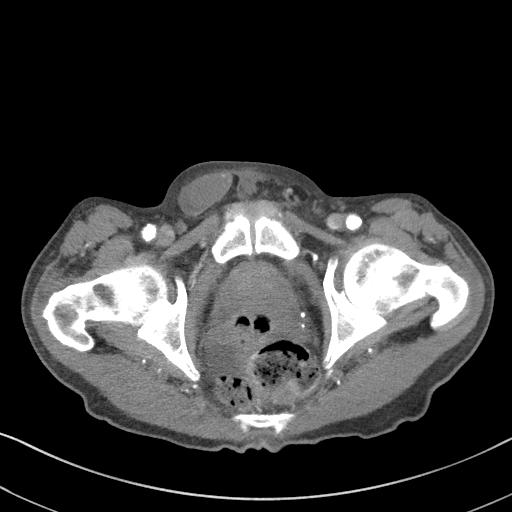
[im 21/91  soft-tissue]
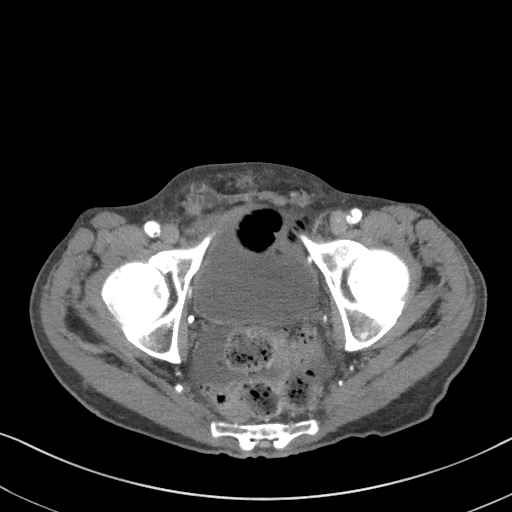
[im 26/91  soft-tissue]
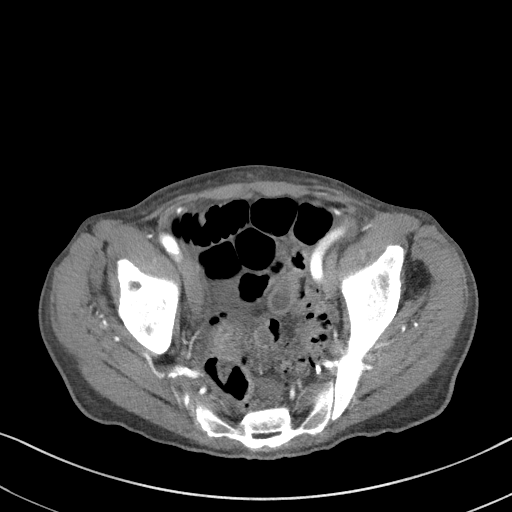
[im 36/91  soft-tissue]
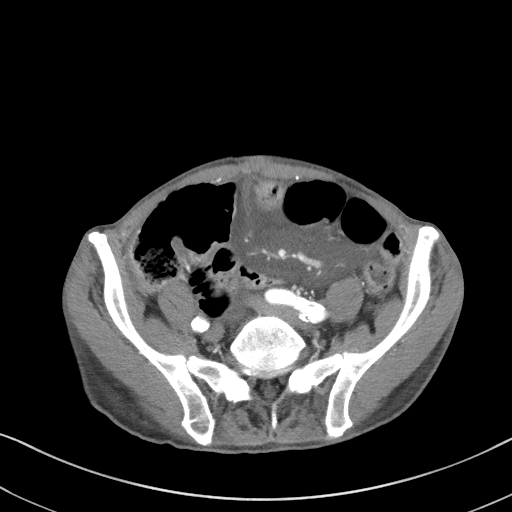
[im 41/91  soft-tissue]
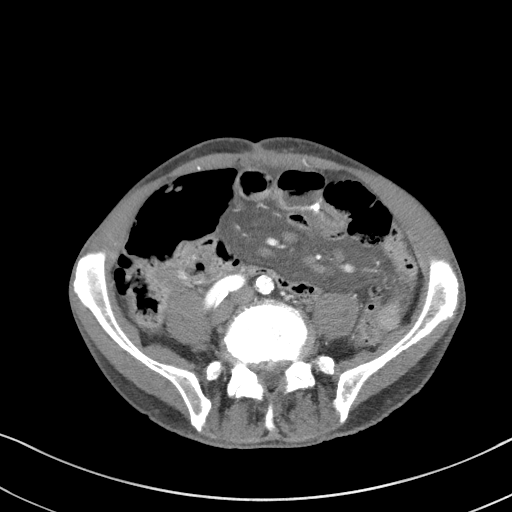
[im 51/91  soft-tissue]
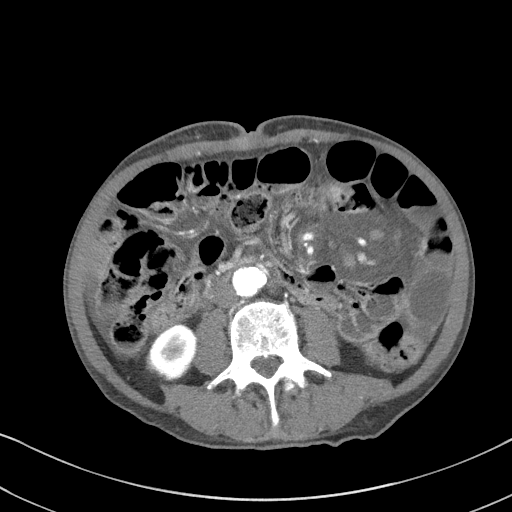
[im 56/91  soft-tissue]
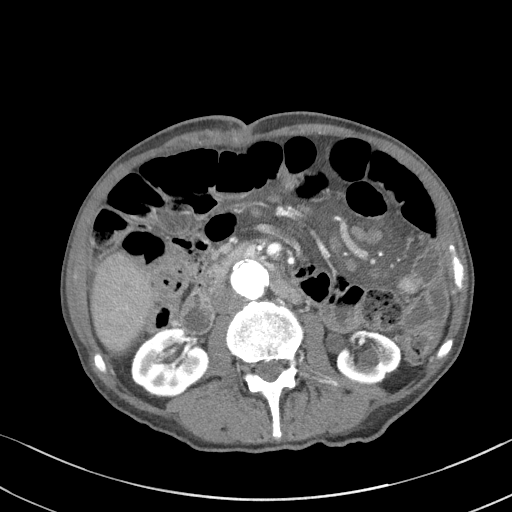
[im 66/91  soft-tissue]
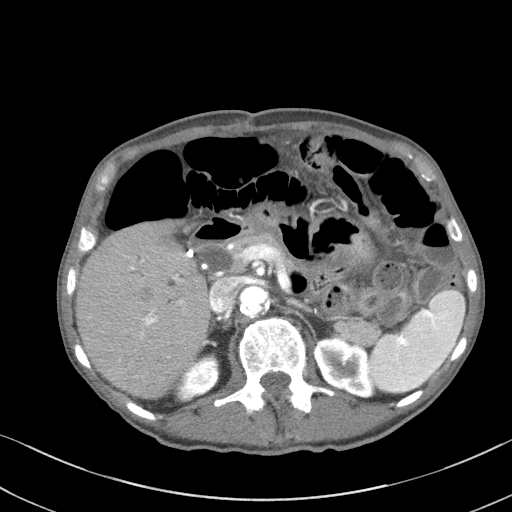
[im 66/91  bone]
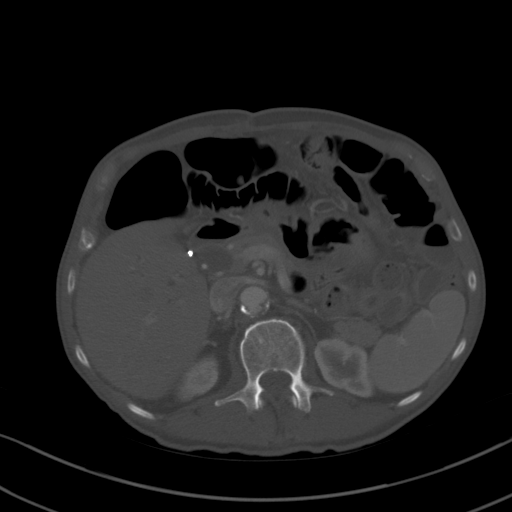
[im 71/91  soft-tissue]
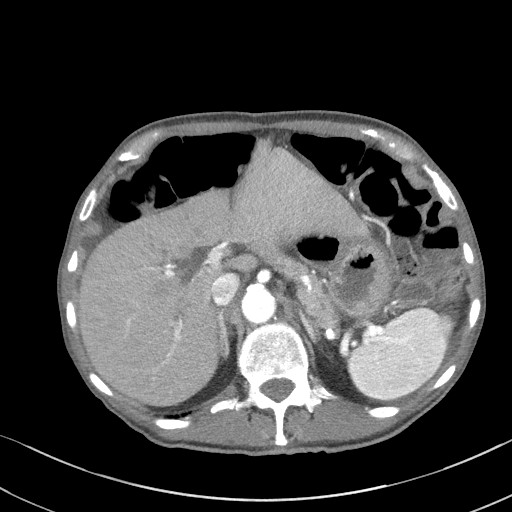
[im 76/91  soft-tissue]
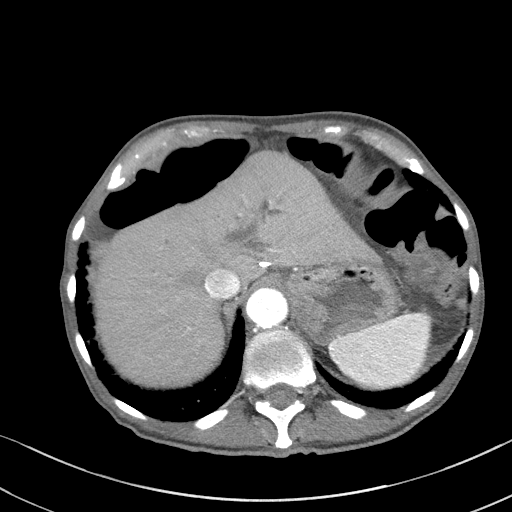
[im 86/91  soft-tissue]
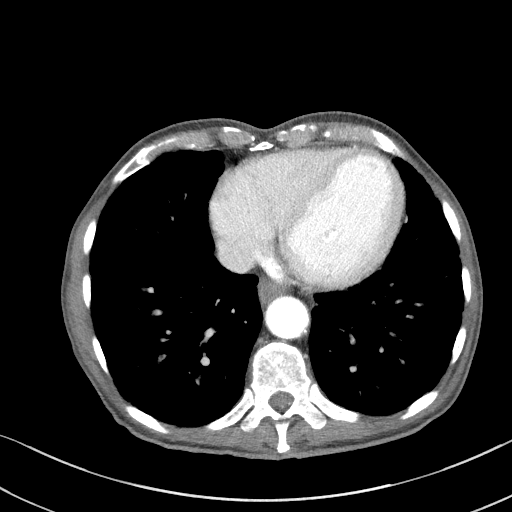

[Series 5: coronal st · coronal · 0.72mm/px · 3 of 100 slices shown]
[im 34/100  soft-tissue]
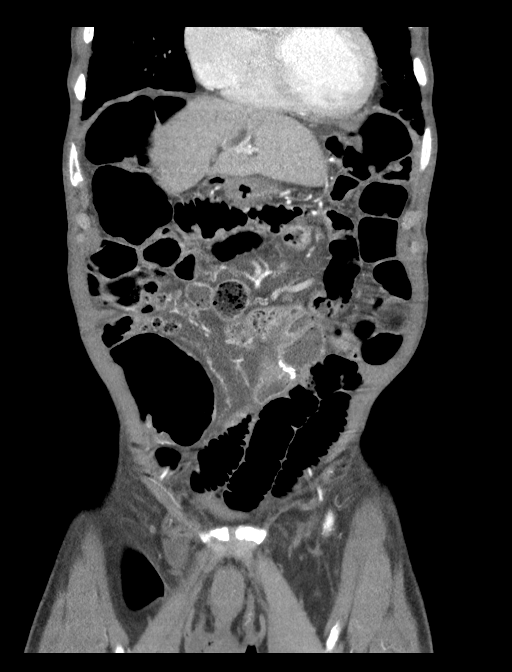
[im 45/100  soft-tissue]
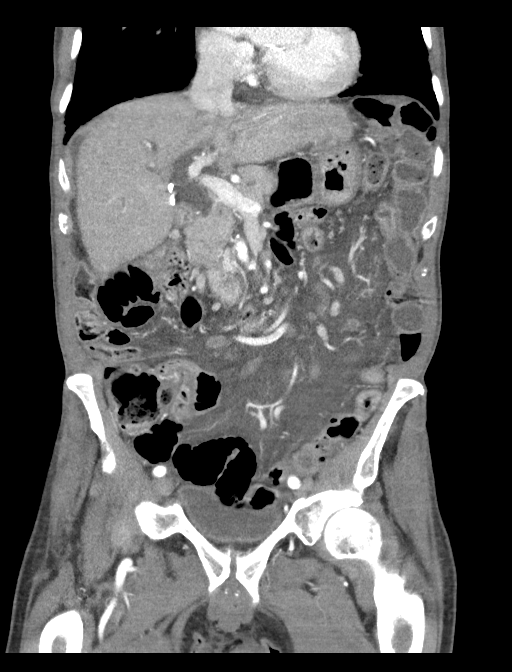
[im 56/100  soft-tissue]
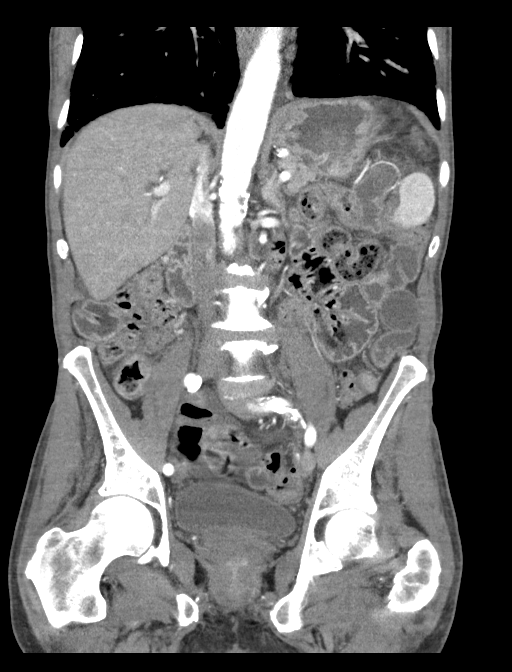

[15 of 46 positions shown; findings below may reference images not displayed]

FINDINGS: Lower chest: Normal heart size. Small hiatal hernia. Centrilobular
paraseptal emphysema at the lung bases. No effusion or pneumothorax.
No pulmonary consolidation.

Hepatobiliary: Status post cholecystectomy. Mild intra and
extrahepatic ductal dilatation likely secondary to reservoir effect
from prior cholecystectomy. No space-occupying mass of the liver.
There is colonic interposition over the liver which may have
simulated free air in conjunction right basilar emphysematous
change.

Pancreas: No focal mass.  Mild pancreatic atrophy.

Spleen: No splenomegaly or mass.

Adrenals/Urinary Tract: Normal bilateral adrenal glands. Symmetric
cortical enhancement of both kidneys with mild ectasia of the left
renal collecting system in a chronic left UPJ configuration.
Nonobstructing calculus in the lower pole of the left kidney is
identified measuring 6 x 2 mm. No hydroureteronephrosis. The urinary
bladder is physiologically distended. No focal mural thickening of
the bladder calculus.

Stomach/Bowel:

There is swirling of the mesentery in the left hemiabdomen
contributing to dilatation of small bowel and consistent with midgut
volvulus. Findings are similar to that seen in [YS]. Fecalized
material is seen within adjacent small bowel. No pneumatosis or free
air.

Surgical clips are seen near the gastroesophageal junction as
before. The stomach is decompressed in appearance. Redemonstration
of colonic diverticulosis along the sigmoid colon without acute
diverticulitis. Postsurgical change with bowel anastomosis in the
left lower quadrant.

Vascular/Lymphatic: Aortoiliac atherosclerosis with ectasia. No
aneurysmal dilatation.. Mildly enlarged lymph nodes identified in
the small bowel mesentery adjacent to the area of swelling vessels,
these measure up to 13 mm short axis.

Reproductive: Mildly enlarged prostate.

Other: Small volume of ascites about the liver, pelvis and right
inguinal canal.

Musculoskeletal: No acute nor suspicious osseous abnormality.
Degenerative disc disease L4-5 and L5-S1.
IMPRESSION: 1. Similar abnormal appearance of small bowel as in [YS] with
dilatation of small bowel loops secondary to swirling/twisting of
the mesenteric vessels in the left hemiabdomen contributing to a
midgut volvulus. No associated free air or pneumatosis. Small volume
ascites with mesenteric edema. These results were called by
telephone at the time of interpretation on [DATE] at [DATE] to
Dr. CHAI, who verbally acknowledged these results.
2. Chronic left UPJ configuration of the left kidney.
3. Aortoiliac atherosclerosis without aneurysm.
4. Bibasilar emphysema.  No free air beneath the diaphragm.

## 2018-01-10 IMAGING — CR DG ABDOMEN ACUTE W/ 1V CHEST
3 series · 3 of 3 positions shown · non-contrast
Comparison: Chest x-ray [DATE]

CLINICAL DATA: Generalized abdominal pain this morning with
previous abdominal surgeries due to volvulus. Generalized body
aches.

EXAM:
DG ABDOMEN ACUTE W/ 1V CHEST

[w chest pa]
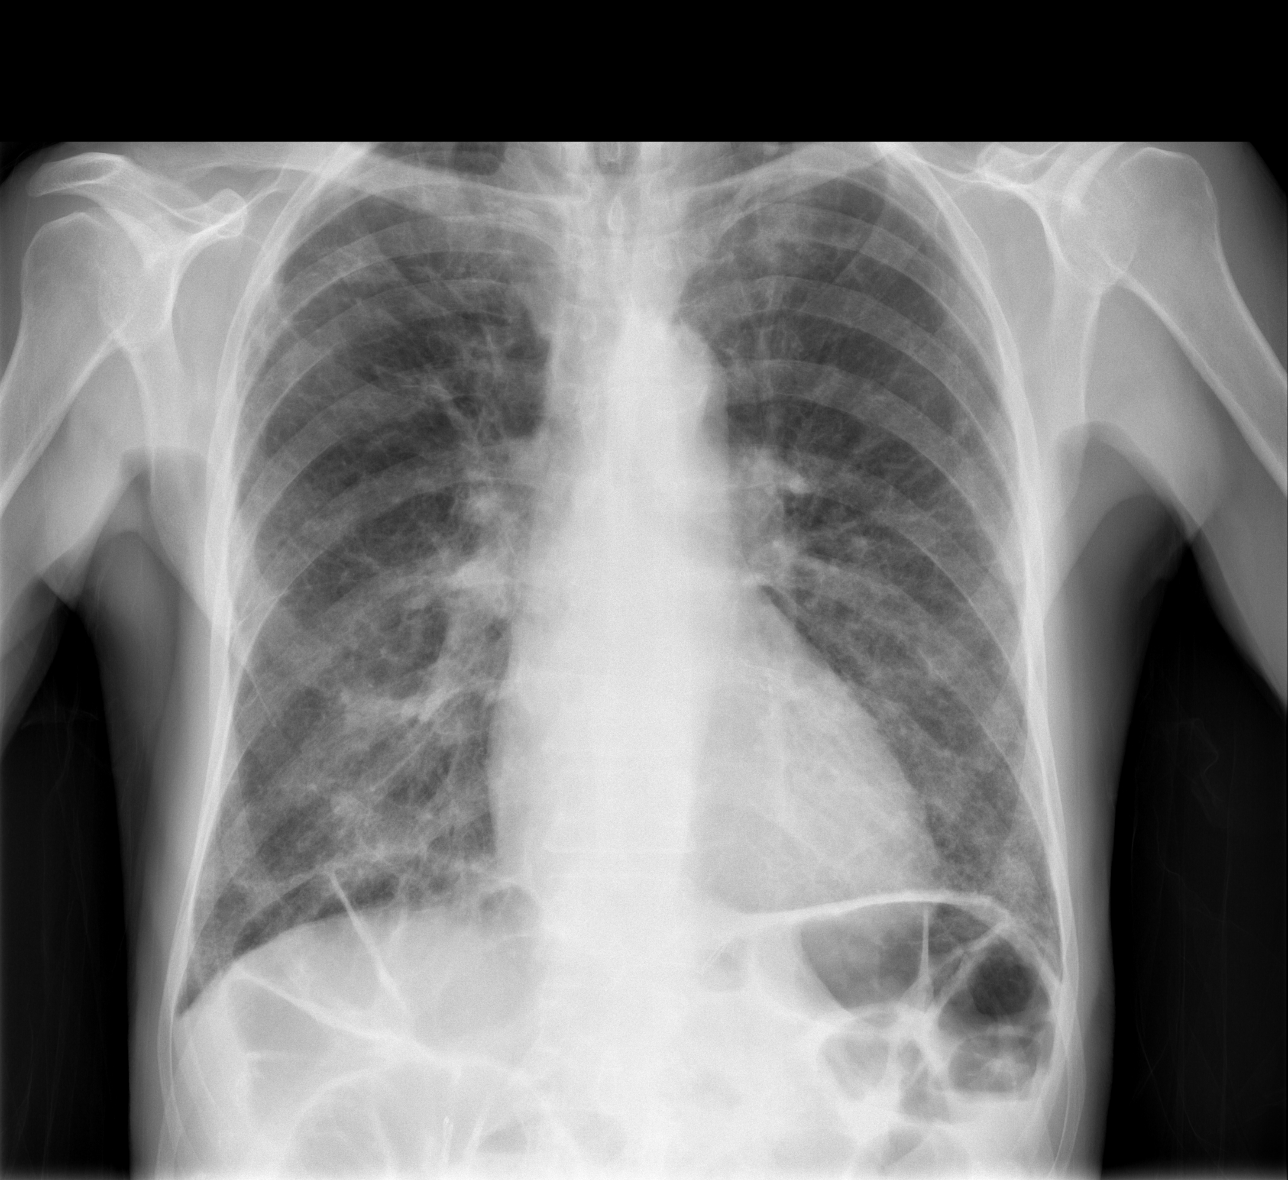

[w abdomen upright]
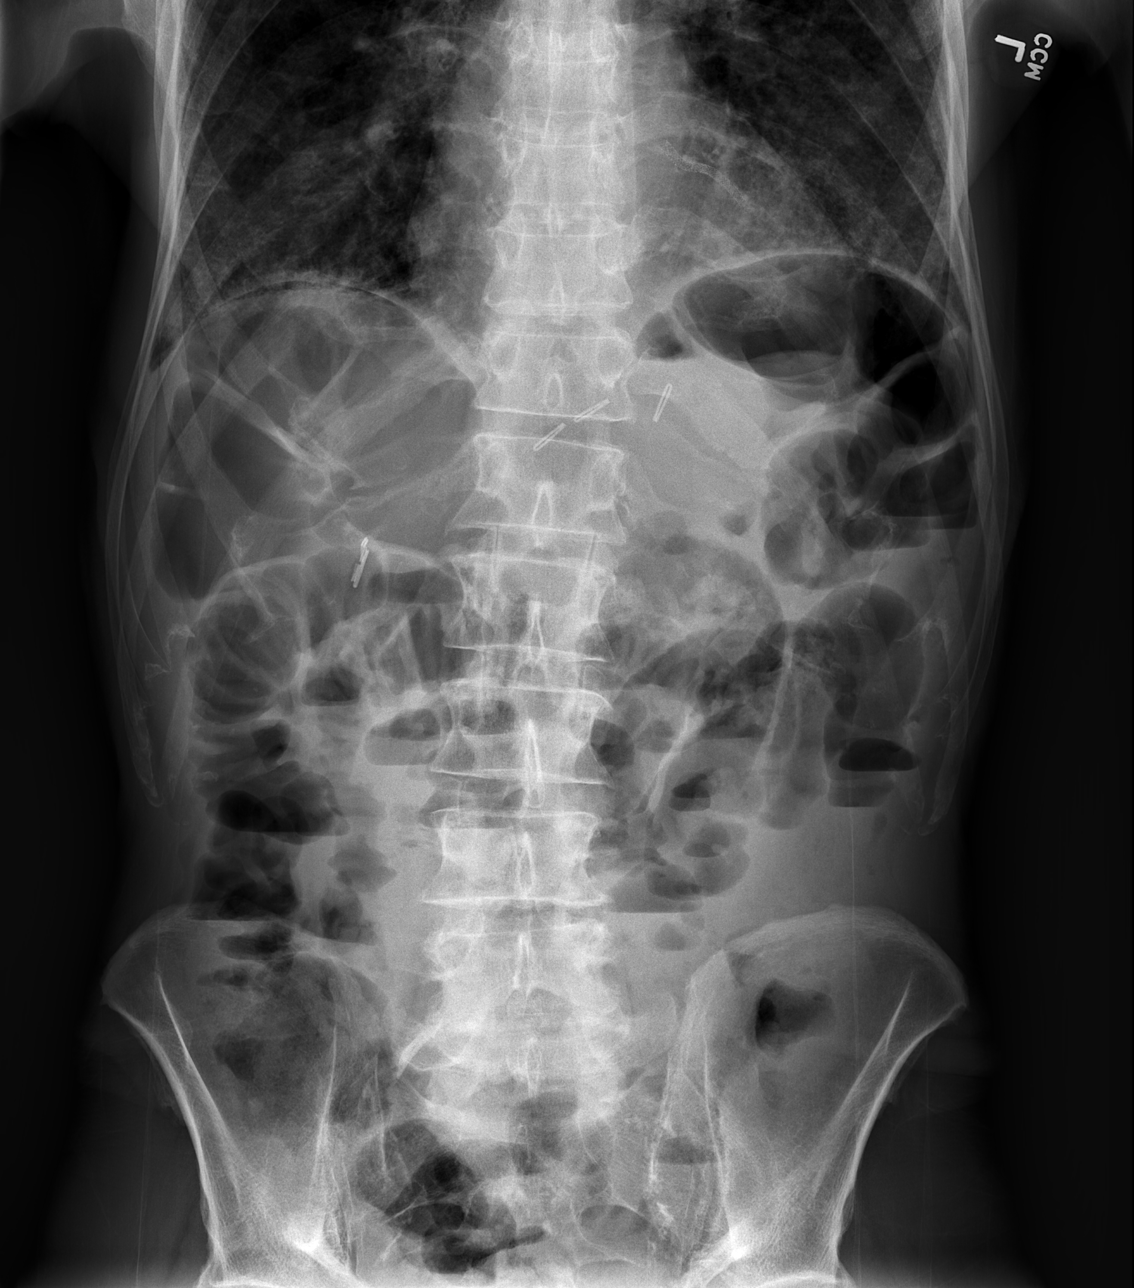

[t abdomen supine]
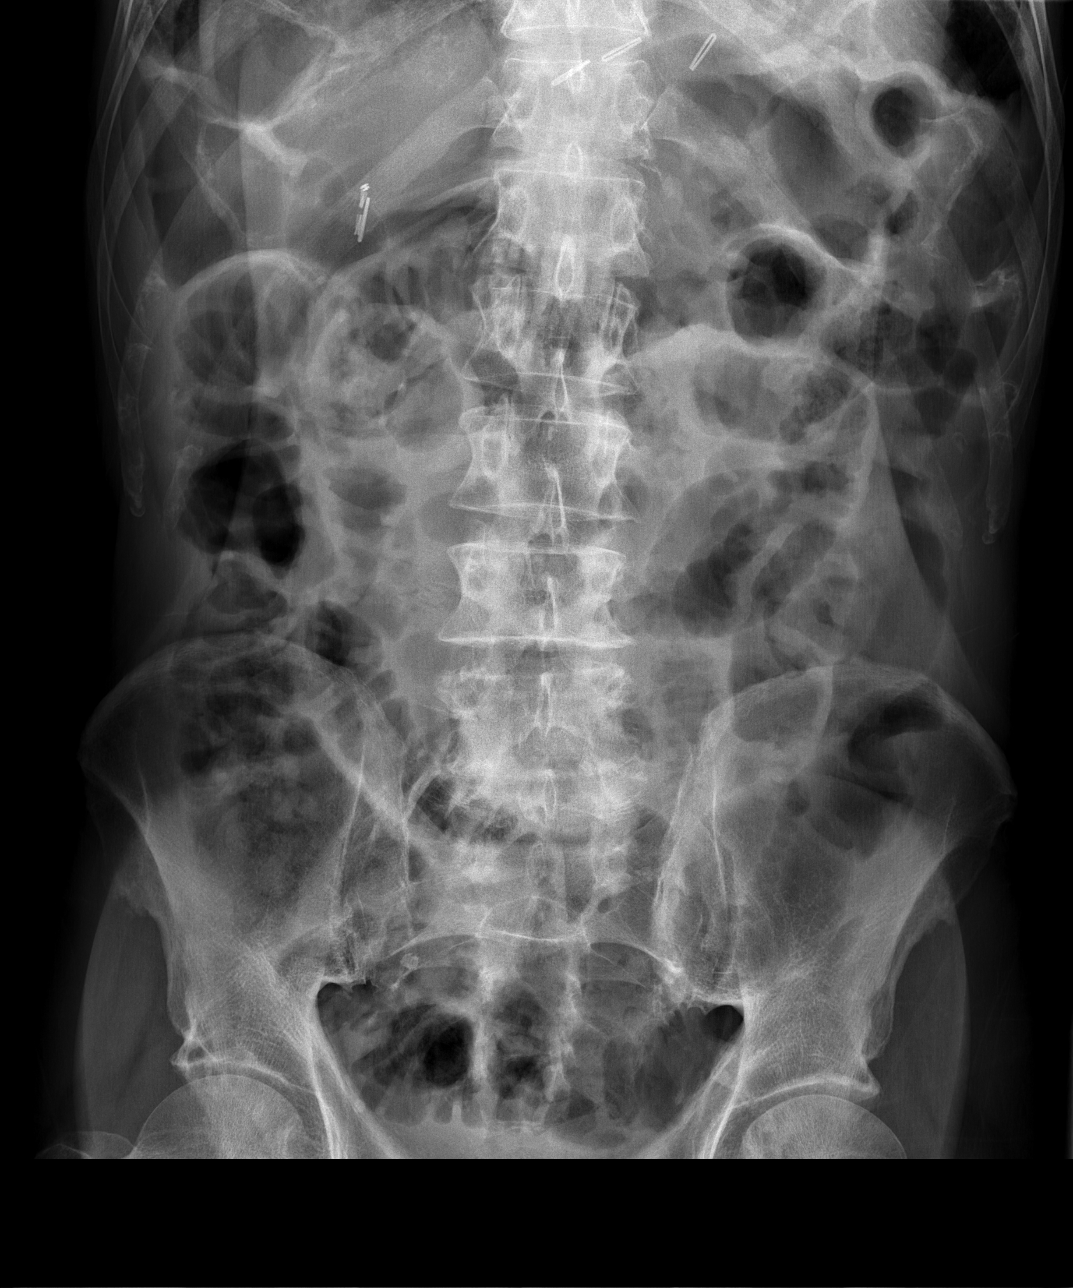

[3 of 3 positions shown; findings below may reference images not displayed]

FINDINGS: Lungs are adequately inflated with mild patchy density over the
right midlung and lung bases with minimal prominence of the
perihilar markings as findings may be due to edema versus
atelectasis or infection. No evidence of effusion. Cardiomediastinal
silhouette and remainder of the chest is unchanged.

Abdominopelvic images demonstrate findings suggesting a small amount
of right subdiaphragmatic free peritoneal air. There are multiple
air-filled loops of large and small bowel. There are several
air-filled dilated small bowel loops with differential air-fluid
levels present. There are surgical clips over the epigastric region
and right upper quadrant. Mild degenerate change of the spine and
hips.
IMPRESSION: Air-filled dilated small bowel loops with air-fluid levels
suggesting early/partial small-bowel obstruction. Findings
suggesting small amount of right subdiaphragmatic free peritoneal
air concerning for perforated viscus. Recommend further evaluation
with CT of the abdomen and pelvis.

Patchy opacification over the right midlung and lung bases which may
be due to edema versus atelectasis or infection

Critical Value/emergent results were called by telephone at the time
of interpretation on [DATE] at [DATE] to Dr. RADHAKRISHNAN, who
verbally acknowledged these results.

## 2018-01-10 SURGERY — LAPAROTOMY, EXPLORATORY
Anesthesia: General

## 2018-01-10 MED ORDER — ACETAMINOPHEN 10 MG/ML IV SOLN
INTRAVENOUS | Status: DC | PRN
  Administered 2018-01-10: 1000 mg via INTRAVENOUS

## 2018-01-10 MED ORDER — PROPOFOL 10 MG/ML IV BOLUS
INTRAVENOUS | Status: AC
  Filled 2018-01-10: qty 20

## 2018-01-10 MED ORDER — LACTATED RINGERS IV SOLN: INTRAVENOUS | Status: DC

## 2018-01-10 MED ORDER — FENTANYL CITRATE (PF) 100 MCG/2ML IJ SOLN
INTRAMUSCULAR | Status: DC | PRN
  Administered 2018-01-10 (×4): 50 ug via INTRAVENOUS

## 2018-01-10 MED ORDER — SUGAMMADEX SODIUM 200 MG/2ML IV SOLN
INTRAVENOUS | Status: DC | PRN
  Administered 2018-01-10: 110 mg via INTRAVENOUS

## 2018-01-10 MED ORDER — ROCURONIUM BROMIDE 10 MG/ML (PF) SYRINGE
PREFILLED_SYRINGE | INTRAVENOUS | Status: AC
  Filled 2018-01-10: qty 5

## 2018-01-10 MED ORDER — SUGAMMADEX SODIUM 200 MG/2ML IV SOLN
INTRAVENOUS | Status: AC
  Filled 2018-01-10: qty 2

## 2018-01-10 MED ORDER — LACTATED RINGERS IV SOLN
INTRAVENOUS | Status: DC | PRN
  Administered 2018-01-10 (×2): via INTRAVENOUS

## 2018-01-10 MED ORDER — ACETAMINOPHEN 10 MG/ML IV SOLN
INTRAVENOUS | Status: AC
  Filled 2018-01-10: qty 100

## 2018-01-10 MED ORDER — GLYCOPYRROLATE 0.2 MG/ML IV SOSY
PREFILLED_SYRINGE | INTRAVENOUS | Status: DC | PRN
  Administered 2018-01-10: 0.4 mg via INTRAVENOUS

## 2018-01-10 MED ORDER — FUROSEMIDE 10 MG/ML IJ SOLN
40.0000 mg | Freq: Once | INTRAMUSCULAR | Status: AC
  Administered 2018-01-10: 40 mg via INTRAVENOUS

## 2018-01-10 MED ORDER — ONDANSETRON HCL 4 MG/2ML IJ SOLN
4.0000 mg | Freq: Once | INTRAMUSCULAR | Status: AC
  Administered 2018-01-10: 4 mg via INTRAVENOUS
  Filled 2018-01-10: qty 2

## 2018-01-10 MED ORDER — LIDOCAINE 2% (20 MG/ML) 5 ML SYRINGE
INTRAMUSCULAR | Status: AC
  Filled 2018-01-10: qty 5

## 2018-01-10 MED ORDER — PHENYLEPHRINE HCL 10 MG/ML IJ SOLN
INTRAMUSCULAR | Status: AC
  Filled 2018-01-10: qty 1

## 2018-01-10 MED ORDER — SUCCINYLCHOLINE CHLORIDE 200 MG/10ML IV SOSY
PREFILLED_SYRINGE | INTRAVENOUS | Status: DC | PRN
  Administered 2018-01-10: 120 mg via INTRAVENOUS

## 2018-01-10 MED ORDER — HYDROMORPHONE HCL 1 MG/ML IJ SOLN: 0.2500 mg | INTRAMUSCULAR | Status: DC | PRN

## 2018-01-10 MED ORDER — LIDOCAINE 2% (20 MG/ML) 5 ML SYRINGE
INTRAMUSCULAR | Status: DC | PRN
  Administered 2018-01-10: 60 mg via INTRAVENOUS

## 2018-01-10 MED ORDER — PHENYLEPHRINE HCL 10 MG/ML IJ SOLN
INTRAVENOUS | Status: DC | PRN
  Administered 2018-01-10: 40 ug/min via INTRAVENOUS

## 2018-01-10 MED ORDER — PIPERACILLIN-TAZOBACTAM 3.375 G IVPB 30 MIN
3.3750 g | Freq: Once | INTRAVENOUS | Status: AC
  Administered 2018-01-10: 3.375 g via INTRAVENOUS
  Filled 2018-01-10 (×2): qty 50

## 2018-01-10 MED ORDER — LEVALBUTEROL HCL 1.25 MG/0.5ML IN NEBU
1.2500 mg | INHALATION_SOLUTION | Freq: Once | RESPIRATORY_TRACT | Status: AC
  Administered 2018-01-10: 1.25 mg via RESPIRATORY_TRACT

## 2018-01-10 MED ORDER — ONDANSETRON HCL 4 MG/2ML IJ SOLN
INTRAMUSCULAR | Status: AC
  Filled 2018-01-10: qty 2

## 2018-01-10 MED ORDER — PROMETHAZINE HCL 25 MG/ML IJ SOLN: 6.2500 mg | INTRAMUSCULAR | Status: DC | PRN

## 2018-01-10 MED ORDER — LACTATED RINGERS IV SOLN
INTRAVENOUS | Status: DC | PRN
  Administered 2018-01-10: 22:00:00 via INTRAVENOUS

## 2018-01-10 MED ORDER — SUCCINYLCHOLINE CHLORIDE 200 MG/10ML IV SOSY
PREFILLED_SYRINGE | INTRAVENOUS | Status: AC
  Filled 2018-01-10: qty 10

## 2018-01-10 MED ORDER — ONDANSETRON HCL 4 MG/2ML IJ SOLN: 4.0000 mg | Freq: Once | INTRAMUSCULAR | Status: DC

## 2018-01-10 MED ORDER — FENTANYL CITRATE (PF) 100 MCG/2ML IJ SOLN
50.0000 ug | Freq: Once | INTRAMUSCULAR | Status: AC
  Administered 2018-01-10: 50 ug via INTRAVENOUS
  Filled 2018-01-10: qty 2

## 2018-01-10 MED ORDER — LEVALBUTEROL HCL 1.25 MG/0.5ML IN NEBU
INHALATION_SOLUTION | RESPIRATORY_TRACT | Status: AC
  Filled 2018-01-10: qty 0.5

## 2018-01-10 MED ORDER — DEXAMETHASONE SODIUM PHOSPHATE 10 MG/ML IJ SOLN
INTRAMUSCULAR | Status: DC | PRN
  Administered 2018-01-10: 10 mg via INTRAVENOUS

## 2018-01-10 MED ORDER — IOPAMIDOL (ISOVUE-300) INJECTION 61%
100.0000 mL | Freq: Once | INTRAVENOUS | Status: AC | PRN
  Administered 2018-01-10: 100 mL via INTRAVENOUS

## 2018-01-10 MED ORDER — MORPHINE SULFATE (PF) 4 MG/ML IV SOLN
4.0000 mg | Freq: Once | INTRAVENOUS | Status: AC
  Administered 2018-01-10: 4 mg via INTRAVENOUS
  Filled 2018-01-10: qty 1

## 2018-01-10 MED ORDER — FENTANYL CITRATE (PF) 250 MCG/5ML IJ SOLN
INTRAMUSCULAR | Status: AC
  Filled 2018-01-10: qty 5

## 2018-01-10 MED ORDER — ROCURONIUM BROMIDE 50 MG/5ML IV SOSY
PREFILLED_SYRINGE | INTRAVENOUS | Status: DC | PRN
  Administered 2018-01-10: 30 mg via INTRAVENOUS

## 2018-01-10 MED ORDER — PROPOFOL 10 MG/ML IV BOLUS
INTRAVENOUS | Status: DC | PRN
  Administered 2018-01-10: 90 mg via INTRAVENOUS

## 2018-01-10 MED ORDER — FUROSEMIDE 10 MG/ML IJ SOLN
INTRAMUSCULAR | Status: AC
Start: 2018-01-10 — End: 2018-01-11
  Filled 2018-01-10: qty 4

## 2018-01-10 SURGICAL SUPPLY — 34 items
APPLICATOR COTTON TIP 6IN STRL (MISCELLANEOUS) IMPLANT
BLADE EXTENDED COATED 6.5IN (ELECTRODE) ×2 IMPLANT
BLADE HEX COATED 2.75 (ELECTRODE) IMPLANT
COVER MAYO STAND STRL (DRAPES) IMPLANT
DRAPE LAPAROSCOPIC ABDOMINAL (DRAPES) ×2 IMPLANT
DRAPE WARM FLUID 44X44 (DRAPE) ×2 IMPLANT
DRSG OPSITE POSTOP 4X12 (GAUZE/BANDAGES/DRESSINGS) ×2 IMPLANT
ELECT REM PT RETURN 15FT ADLT (MISCELLANEOUS) ×2 IMPLANT
GAUZE SPONGE 4X4 12PLY STRL (GAUZE/BANDAGES/DRESSINGS) IMPLANT
GLOVE BIO SURGEON STRL SZ7.5 (GLOVE) ×14 IMPLANT
GLOVE BIOGEL PI IND STRL 7.0 (GLOVE) ×1 IMPLANT
GLOVE BIOGEL PI INDICATOR 7.0 (GLOVE) ×1
GOWN STRL REUS W/ TWL XL LVL3 (GOWN DISPOSABLE) IMPLANT
GOWN STRL REUS W/TWL LRG LVL3 (GOWN DISPOSABLE) ×4 IMPLANT
GOWN STRL REUS W/TWL XL LVL3 (GOWN DISPOSABLE) IMPLANT
HANDLE SUCTION POOLE (INSTRUMENTS) ×1 IMPLANT
KIT BASIN OR (CUSTOM PROCEDURE TRAY) ×2 IMPLANT
NS IRRIG 1000ML POUR BTL (IV SOLUTION) ×4 IMPLANT
PACK GENERAL/GYN (CUSTOM PROCEDURE TRAY) ×2 IMPLANT
SPONGE LAP 18X18 X RAY DECT (DISPOSABLE) IMPLANT
STAPLER VISISTAT 35W (STAPLE) ×2 IMPLANT
SUCTION POOLE HANDLE (INSTRUMENTS) ×2
SUT PDS AB 1 CTX 36 (SUTURE) IMPLANT
SUT PDS AB 1 TP1 96 (SUTURE) ×2 IMPLANT
SUT SILK 2 0 (SUTURE) ×1
SUT SILK 2 0 SH CR/8 (SUTURE) ×2 IMPLANT
SUT SILK 2-0 18XBRD TIE 12 (SUTURE) ×1 IMPLANT
SUT SILK 3 0 (SUTURE)
SUT SILK 3 0 SH CR/8 (SUTURE) ×2 IMPLANT
SUT SILK 3-0 18XBRD TIE 12 (SUTURE) IMPLANT
TOWEL OR 17X26 10 PK STRL BLUE (TOWEL DISPOSABLE) ×2 IMPLANT
TRAY FOLEY W/METER SILVER 16FR (SET/KITS/TRAYS/PACK) ×2 IMPLANT
WATER STERILE IRR 1000ML POUR (IV SOLUTION) IMPLANT
YANKAUER SUCT BULB TIP NO VENT (SUCTIONS) ×2 IMPLANT

## 2018-01-10 NOTE — Transfer of Care (Signed)
Immediate Anesthesia Transfer of Care Note  Patient: [name] [name]  Procedure(s) Performed: exploratory laparotomy, lysis of adhesions, decompression of midgut volvulus (N/A )  Patient Location: PACU  Anesthesia Type:General  Level of Consciousness: awake and patient cooperative  Airway & Oxygen Therapy: Patient Spontanous Breathing and Patient connected to face mask oxygen  Post-op Assessment: Report given to RN and Post -op Vital signs reviewed and stable  Post vital signs: Reviewed and stable, O2 saturation 80% (was 80% baseline), receiving Xopenex inhaler, and 40 mg Lasix.   Last Vitals:  Vitals:   01/10/18 2000 01/10/18 2052  BP: (!) 185/73 (!) 159/68  Pulse: (!) 53 (!) 52  Resp: 15 20  Temp:    SpO2: 91% 90%    Last Pain:  Vitals:   01/10/18 2052  TempSrc:   PainSc: 10-Worst pain ever         Complications: No apparent anesthesia complications

## 2018-01-10 NOTE — H&P (Signed)
[name] [name] is an 84 y.o. male.   Chief Complaint: Abdominal pain HPI: The patient is an 84 year old white male who presents with acute abdominal pain that started earlier today.  The pain has been associated with nausea and vomiting.  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  His CT scan done today looks exactly the same as it did on the previous 2 admissions consistent with a midgut volvulus.  Past Medical History:  Diagnosis Date  . Carotid artery occlusion   . Chronic systolic CHF (congestive heart failure) ([place])    a. 07/2013 Echo: EF 35-40%  . CKD (chronic kidney disease), stage III ([place])    pt. states that he does not have CKD  . CML (chronic myelocytic leukemia) ([place])   . Coronary artery disease    a. 09/2013 Cath: LM min irregs, LAD 95p(2.75x20 Promus Premier DES & 3.0x20 Promus Premier DES overlapping), D1 50-60, LCX 30p, OM1 31m OM2 60-70p, RCA small, nl.  . Diverticulitis   . Dysphonia 01/08/2017  . History of hiatal hernia   . History of kidney stones   . History of small bowel obstruction   . Hyperlipidemia   . Ischemic cardiomyopathy    a. 07/2013 Echo: EF 35-40%, apical septal, apical lat, apical AK, mod MR.  . Small bowel perforation ([place]    a. 11/2012 s/p emergent SB resection.  . 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 CARDIAC CATHETERIZATION  10/09/2013  . CAROTID ENDARTERECTOMY    . COLON SURGERY  11/2012   BOWEL SURGERY   . CORONARY ANGIOPLASTY  10/09/2013   MID LAD  . ENDARTERECTOMY Right 09/17/2015   Procedure: Right Carotid ENDARTERECTOMY with Xenosure Patch Angioplasty;  Surgeon: VSerafina Mitchell MD;  [facility]  Service: Vascular;  Laterality: Right;  . EYE SURGERY Bilateral   . INGUINAL HERNIA REPAIR Bilateral   . LAPAROSCOPIC CHOLECYSTECTOMY    . LAPAROTOMY N/A 10/14/2017    Procedure: EXPLORATORY LAPAROTOMY for SMALL BOWEL OBSTRUCTION, cecopexy;  Surgeon: HExcell Seltzer MD;  Location: [facility];  Service: General;  Laterality: N/A;  . LAPAROTOMY N/A 11/09/2017   Procedure: EXPLORATORY LAPAROTOMY reduction small bowel valvulous;  Surgeon: WRolm Bookbinder MD;  Location: [facility];  Service: General;  Laterality: N/A;  . LEFT HEART CATHETERIZATION WITH CORONARY ANGIOGRAM N/A 10/09/2013   Procedure: LEFT HEART CATHETERIZATION WITH CORONARY ANGIOGRAM;  Surgeon: DLarey Dresser MD;  [facility];  Service: Cardiovascular;  Laterality: N/A;  . PERCUTANEOUS CORONARY STENT INTERVENTION (PCI-S)  10/09/2013   Procedure: PERCUTANEOUS CORONARY STENT INTERVENTION (PCI-S);  Surgeon: DLarey Dresser MD;  [facility];  Service: Cardiovascular;;    Family History  Problem Relation Age of Onset  . Emphysema Father        died @ 65 smoked  . Heart disease Father   . Kidney Stones Father   . Diabetes Mother        died @ 7106 . Stroke Mother   . Head & neck cancer Brother   . Lymphoma Brother   . Colon cancer Neg Hx   . Stomach cancer Neg Hx   . Rectal cancer Neg Hx   . Esophageal cancer Neg Hx   . Liver cancer Neg Hx    Social History:  reports that he  quit smoking about 18 years ago. His smoking use included cigarettes. He has a 45.00 pack-year smoking history. he has never used smokeless tobacco. He reports that he does not drink alcohol or use drugs.  Allergies:  Allergies  Allergen Reactions  . Percocet [Oxycodone-Acetaminophen] Itching  . Metronidazole Other (See Comments)    Insomnia and nervousness     (Not in a hospital admission)  Results for orders placed or performed during the hospital encounter of 01/10/18 (from the past 48 hour(s))  Urinalysis, Routine w reflex microscopic     Status: Abnormal   Collection Time: 01/10/18  4:30 PM  Result Value Ref Range   Color, Urine YELLOW YELLOW   APPearance CLEAR CLEAR   Specific  Gravity, Urine 1.025 1.005 - 1.030   pH 5.5 5.0 - 8.0   Glucose, UA NEGATIVE NEGATIVE mg/dL   Hgb urine dipstick TRACE (A) NEGATIVE   Bilirubin Urine NEGATIVE NEGATIVE   Ketones, ur NEGATIVE NEGATIVE mg/dL   Protein, ur 30 (A) NEGATIVE mg/dL   Nitrite NEGATIVE NEGATIVE   Leukocytes, UA NEGATIVE NEGATIVE    Comment: Performed at [facility], [address] [place], [place] [address]  Urinalysis, Microscopic (reflex)     Status: Abnormal   Collection Time: 01/10/18  4:30 PM  Result Value Ref Range   RBC / HPF 0-5 0 - 5 RBC/hpf   WBC, UA NONE SEEN 0 - 5 WBC/hpf   Bacteria, UA RARE (A) NONE SEEN   Squamous Epithelial / LPF 0-5 (A) NONE SEEN    Comment: Performed at [facility], [address] [place], [place] [address]  Lipase, blood     Status: None   Collection Time: 01/10/18  4:37 PM  Result Value Ref Range   Lipase 28 11 - 51 U/L    Comment: Performed at [facility], [address] [place], [place] [address]  Comprehensive metabolic panel     Status: Abnormal   Collection Time: 01/10/18  4:37 PM  Result Value Ref Range   Sodium 138 135 - 145 mmol/L   Potassium 3.6 3.5 - 5.1 mmol/L   Chloride 106 101 - 111 mmol/L   CO2 22 22 - 32 mmol/L   Glucose, Bld 123 (H) 65 - 99 mg/dL   BUN 16 6 - 20 mg/dL   Creatinine, Ser 1.16 0.61 - 1.24 mg/dL   Calcium 8.6 (L) 8.9 - 10.3 mg/dL   Total Protein 8.0 6.5 - 8.1 g/dL   Albumin 3.6 3.5 - 5.0 g/dL   AST 35 15 - 41 U/L   ALT 28 17 - 63 U/L   Alkaline Phosphatase 115 38 - 126 U/L   Total Bilirubin 0.3 0.3 - 1.2 mg/dL   GFR calc non Af Amer 56 (L) >60 mL/min   GFR calc Af Amer >60 >60 mL/min    Comment: (NOTE) The eGFR has been calculated using the CKD EPI equation. This calculation has not been validated in all clinical situations. eGFR's persistently <60 mL/min signify possible Chronic Kidney Disease.    Anion gap 10 5 - 15    Comment: Performed at [facility], [address]  [place], [place] [address]  CBC     Status: Abnormal   Collection Time: 01/10/18  4:37 PM  Result Value Ref Range   WBC 9.7 4.0 - 10.5 K/uL   RBC 4.27 4.22 - 5.81 MIL/uL   Hemoglobin 12.1 (L) 13.0 - 17.0  g/dL   HCT 38.0 (L) 39.0 - 52.0 %   MCV 89.0 78.0 - 100.0 fL   MCH 28.3 26.0 - 34.0 pg   MCHC 31.8 30.0 - 36.0 g/dL   RDW 17.3 (H) 11.5 - 15.5 %   Platelets 178 150 - 400 K/uL    Comment: Performed at [facility], [address] [place], [place] [address]   Ct Abdomen Pelvis W Contrast  Result Date: 01/10/2018 CLINICAL DATA:  Abnormal abdomen radiograph suspicious for partial or early SBO and possible perforated hollow viscus. EXAM: CT ABDOMEN AND PELVIS WITH CONTRAST TECHNIQUE: Multidetector CT imaging of the abdomen and pelvis was performed using the standard protocol following bolus administration of intravenous contrast. CONTRAST:  14m ISOVUE-300 IOPAMIDOL (ISOVUE-300) INJECTION 61% COMPARISON:  CT from 11/09/2017 FINDINGS: Lower chest: Normal heart size. Small hiatal hernia. Centrilobular paraseptal emphysema at the lung bases. No effusion or pneumothorax. No pulmonary consolidation. Hepatobiliary: Status post cholecystectomy. Mild intra and extrahepatic ductal dilatation likely secondary to reservoir effect from prior cholecystectomy. No space-occupying mass of the liver. There is colonic interposition over the liver which may have simulated free air in conjunction right basilar emphysematous change. Pancreas: No focal mass.  Mild pancreatic atrophy. Spleen: No splenomegaly or mass. Adrenals/Urinary Tract: Normal bilateral adrenal glands. Symmetric cortical enhancement of both kidneys with mild ectasia of the left renal collecting system in a chronic left UPJ configuration. Nonobstructing calculus in the lower pole of the left kidney is identified measuring 6 x 2 mm. No hydroureteronephrosis. The urinary bladder is physiologically distended. No focal mural thickening of the bladder  calculus. Stomach/Bowel: There is swirling of the mesentery in the left hemiabdomen contributing to dilatation of small bowel and consistent with midgut volvulus. Findings are similar to that seen in 2018. Fecalized material is seen within adjacent small bowel. No pneumatosis or free air. Surgical clips are seen near the gastroesophageal junction as before. The stomach is decompressed in appearance. Redemonstration of colonic diverticulosis along the sigmoid colon without acute diverticulitis. Postsurgical change with bowel anastomosis in the left lower quadrant. Vascular/Lymphatic: Aortoiliac atherosclerosis with ectasia. No aneurysmal dilatation. Mildly enlarged lymph nodes identified in the small bowel mesentery adjacent to the area of swelling vessels, these measure up to 13 mm short axis. Reproductive: Mildly enlarged prostate. Other: Small volume of ascites about the liver, pelvis and right inguinal canal. Musculoskeletal: No acute nor suspicious osseous abnormality. Degenerative disc disease L4-5 and L5-S1. IMPRESSION: 1. 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 No associated free air or pneumatosis. Small volume ascites with mesenteric edema. These results were called by telephone at the time of interpretation on 01/10/2018 at 6:20 pm to Dr. BTamera Punt who verbally acknowledged these results. 2. 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 3. Aortoiliac atherosclerosis without aneurysm. 4. Bibasilar emphysema.  No free air beneath the diaphragm. Electronically Signed   By: DAshley RoyaltyM.D.   On: 01/10/2018 18:20   Dg Abd Acute W/chest  Result Date: 01/10/2018 CLINICAL DATA:  Generalized abdominal pain this morning with previous abdominal surgeries due to volvulus. Generalized body aches. EXAM: DG ABDOMEN ACUTE W/ 1V CHEST COMPARISON:  Chest  x-ray 11/11/2017 FINDINGS: Lungs are adequately inflated with mild patchy density over the right midlung and lung bases with minimal prominence of the perihilar markings as findings may be due to edema versus  atelectasis or infection. No evidence of effusion. Cardiomediastinal silhouette and remainder of the chest is unchanged. Abdominopelvic images demonstrate findings suggesting a small amount of right subdiaphragmatic free peritoneal air. There are multiple air-filled loops of large and small bowel. There are several air-filled dilated small bowel loops with differential air-fluid levels present. There are surgical clips over the epigastric region and right upper quadrant. Mild degenerate change of the spine and hips. IMPRESSION: Air-filled dilated small bowel loops with air-fluid levels suggesting early/partial small-bowel obstruction. Findings suggesting small amount of right subdiaphragmatic free peritoneal air concerning for perforated viscus. Recommend further evaluation with CT of the abdomen and pelvis. Patchy opacification over the right midlung and lung bases which may be due to edema versus atelectasis or infection Critical Value/emergent results were called by telephone at the time of interpretation on 01/10/2018 at 5:24 pm to Dr. Billy Fischer, who verbally acknowledged these results. Electronically Signed   By: Marin Olp M.D.   On: 01/10/2018 17:25    Review of Systems  Constitutional: Positive for weight loss.  HENT: Negative.   Eyes: Negative.   Respiratory: Negative.   Cardiovascular: Negative.   Gastrointestinal: Positive for abdominal pain, nausea and vomiting.  Genitourinary: Negative.   Musculoskeletal: Negative.   Skin: Negative.   Neurological: Negative.   Endo/Heme/Allergies: Negative.   Psychiatric/Behavioral: Negative.     Blood pressure (!) 185/73, pulse (!) 53, temperature 97.7 F (36.5 C), temperature source Oral, resp. rate 15, height 5' 9.5" (1.765 m), weight 52.2  kg (115 lb), SpO2 91 %. Physical Exam  Constitutional: He is oriented to person, place, and time. He appears well-developed and well-nourished. No distress.  HENT:  Head: Normocephalic and atraumatic.  Mouth/Throat: No oropharyngeal exudate.  Eyes: Conjunctivae and EOM are normal. Pupils are equal, round, and reactive to light.  Neck: Normal range of motion. Neck supple.  Cardiovascular: Normal rate, regular rhythm and normal heart sounds.  Respiratory: Effort normal and breath sounds normal. No stridor. No respiratory distress.  GI: Soft. He exhibits distension. There is tenderness.  Musculoskeletal: Normal range of motion. He exhibits no edema or tenderness.  Neurological: He is alert and oriented to person, place, and time. Coordination normal.  Skin: Skin is warm and dry. No erythema.  Psychiatric: He has a normal mood and affect. His behavior is normal. Thought content normal.     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  Because of the risk of ischemia to the bowel I think he would benefit from exploration.  He may require bowel resection.  I have discussed with him in detail the risks and benefits of the operation as well as some of the technical aspects and he understands and wishes to proceed  Merrie Roof, MD 01/10/2018, 8:16 PM

## 2018-01-10 NOTE — Anesthesia Procedure Notes (Signed)
Procedure Name: Intubation Date/Time: 01/10/2018 9:23 PM Performed by: Montel Clock, CRNA Pre-anesthesia Checklist: Patient identified, Emergency Drugs available, Suction available, Patient being monitored and Timeout performed Patient Re-evaluated:Patient Re-evaluated prior to induction Oxygen Delivery Method: Circle system utilized Preoxygenation: Pre-oxygenation with 100% oxygen Induction Type: IV induction, Rapid sequence and Cricoid Pressure applied Laryngoscope Size: Mac and 3 Grade View: Grade I Tube type: Subglottic suction tube Tube size: 7.5 mm Number of attempts: 1 Airway Equipment and Method: Stylet Placement Confirmation: ETT inserted through vocal cords under direct vision,  positive ETCO2 and breath sounds checked- equal and bilateral Secured at: 23 cm Tube secured with: Tape Dental Injury: Teeth and Oropharynx as per pre-operative assessment

## 2018-01-10 NOTE — Progress Notes (Signed)
Pt stated that it was ok for his daughter to be in the room and assist in answering questions on the nursing admission hx. Lucius Conn BSN, RN-BC Admissions RN 01/10/2018 8:34 PM

## 2018-01-10 NOTE — ED Notes (Signed)
Patient transported to CT 

## 2018-01-10 NOTE — ED Provider Notes (Signed)
7:20 PM Patient was sent from med center [facility] for surgical evaluation by Dr. Marlou Starks with general surgery.  According to Dr. Tamera Punt who sent the patient, patient has abdominal pain and history of volvulus.  On arrival, patient is continued to have abdominal pain and is having nausea and vomiting.  Patient is vomiting up yellow emesis.  Patient will be given more Zofran.  General surgery will be called to let them know patient has arrived.  Anticipate admission for further management.   General surgery will take patient to operating room for further management.  Patient admitted to OR in stable condition.   Clinical Impression: 1. Perforated bowel ([place])   2. Encounter for nasogastric (NG) tube placement   3. Volvulus ([place])     Disposition: Admit  This note was prepared with assistance of Dragon voice recognition software. Occasional wrong-word or sound-a-like substitutions may have occurred due to the inherent limitations of voice recognition software.      Tegeler, Gwenyth Allegra, MD 01/11/18 [phone]

## 2018-01-10 NOTE — ED Notes (Signed)
Pt placed on O2 2lpm after sats dropped to 85%. RT aware.

## 2018-01-10 NOTE — ED Provider Notes (Signed)
[facility] Provider Note   CSN: [id number] Arrival date & time: 01/10/18  1522     History   Chief Complaint Chief Complaint  Patient presents with  . Abdominal Pain    HPI [name] [name] is a 84 y.o. male.  Patient is a 84 year old male with a history of recent midgut volvulus x2 requiring exploratory laparotomy in both November and December 2018.  He presents today with abdominal pain that started about 1130 this morning.  He states it is coming in waves.  It is generalized pain.  He has had some nausea but no vomiting.  He did have a normal bowel movement this morning.  No urinary symptoms.  No fevers.  He states that pain feels similar to when he had his volvulus previously.  He has not taken anything at home for the pain.      Past Medical History:  Diagnosis Date  . Carotid artery occlusion   . Chronic systolic CHF (congestive heart failure) ([place])    a. 07/2013 Echo: EF 35-40%  . CKD (chronic kidney disease), stage III ([place])    pt. states that he does not have CKD  . CML (chronic myelocytic leukemia) ([place])   . Coronary artery disease    a. 09/2013 Cath: LM min irregs, LAD 95p(2.75x20 Promus Premier DES & 3.0x20 Promus Premier DES overlapping), D1 50-60, LCX 30p, OM1 77m, OM2 60-70p, RCA small, nl.  . Diverticulitis   . Dysphonia 01/08/2017  . History of hiatal hernia   . History of kidney stones   . History of small bowel obstruction   . Hyperlipidemia   . Ischemic cardiomyopathy    a. 07/2013 Echo: EF 35-40%, apical septal, apical lat, apical AK, mod MR.  . Small bowel perforation ([place])    a. 11/2012 s/p emergent SB resection.  . 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    Patient Active Problem List   Diagnosis Date Noted  . 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 Acidosis, metabolic, with respiratory acidosis 11/10/2017  . Encounter for weaning from ventilator ([place]) 11/10/2017  . CKD (chronic kidney disease), stage III ([place]) 11/10/2017  . Status post  exploratory laparotomy 11/09/2017  . 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 Bilateral rales   . 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 Cough 08/17/2016  . Carotid stenosis 09/17/2015  . Right carotid bruit 12/14/2014  . Unstable angina ([place]) 10/10/2013  . Ischemic cardiomyopathy   . Hyperlipidemia   . Chronic systolic CHF (congestive heart failure) ([place]) 10/03/2013  . CAD (coronary artery disease) 10/03/2013  . Acute systolic heart failure ([place]) 08/11/2013  . Chronic kidney disease (CKD), stage III (moderate) ([facility]) 08/11/2013  . Acute respiratory failure with hypoxia ([place]) 08/09/2013  . Pulmonary edema 08/09/2013  . Small bowel obstruction ([place]) 08/09/2013    Past Surgical History:  Procedure Laterality Date  . CARDIAC CATHETERIZATION  10/09/2013  . CAROTID ENDARTERECTOMY    . COLON SURGERY  11/2012   BOWEL SURGERY   . CORONARY ANGIOPLASTY  10/09/2013   MID LAD  . ENDARTERECTOMY Right 09/17/2015   Procedure: Right Carotid ENDARTERECTOMY with Xenosure Patch Angioplasty;  Surgeon: Serafina Mitchell, MD;  Location: [facility];  Service: Vascular;  Laterality: Right;  . EYE SURGERY Bilateral   . INGUINAL HERNIA REPAIR Bilateral   . LAPAROSCOPIC CHOLECYSTECTOMY    . LAPAROTOMY N/A 10/14/2017   Procedure: EXPLORATORY LAPAROTOMY for SMALL BOWEL OBSTRUCTION, cecopexy;  Surgeon: Excell Seltzer, MD;  Location: [facility];  Service: General;  Laterality: N/A;  .  LAPAROTOMY N/A 11/09/2017   Procedure: EXPLORATORY LAPAROTOMY reduction small bowel valvulous;  Surgeon: Rolm Bookbinder, MD;  Location: [facility];  Service: General;  Laterality: N/A;  . LEFT HEART CATHETERIZATION WITH CORONARY ANGIOGRAM N/A 10/09/2013   Procedure: LEFT HEART CATHETERIZATION WITH CORONARY ANGIOGRAM;  Surgeon: Larey Dresser, MD;  Location: [facility] CATH LAB;  Service: Cardiovascular;  Laterality: N/A;  . PERCUTANEOUS CORONARY STENT INTERVENTION (PCI-S)  10/09/2013   Procedure: PERCUTANEOUS CORONARY STENT INTERVENTION (PCI-S);  Surgeon: Larey Dresser, MD;  Location: [facility] CATH LAB;  Service: Cardiovascular;;       Home Medications    Prior to Admission medications   Medication Sig Start Date End Date Taking? Authorizing Provider  acetaminophen (TYLENOL) 325 MG tablet You can take plain Tylenol 2 tablets every 4 hours for mild pain.  You can take the prescribed pain medicine for pain not relieved by plain Tylenol.  DO NOT TAKE MORE THAN 4000 MG OF TYLENOL PER DAY.  IT CAN HARM YOUR LIVER.  TYLENOL (ACETAMINOPHEN) IS ALSO IN YOUR PRESCRIPTION PAIN MEDICATION.  YOU HAVE TO COUNT IT IN YOUR DAILY TOTAL. 10/19/17   Earnstine Regal, PA-C  Ascorbic Acid (VITAMIN C PO) Take 1,000 mg by mouth daily.     [provider]  aspirin EC 81 MG tablet Take 1 tablet (81 mg total) by mouth daily. 10/02/13   Larey Dresser, MD  BOSULIF 100 MG tablet Call Dr. [name], make an appointment and discuss when to resume this medicine. Patient taking differently: Take 400 mg by mouth daily with breakfast. Call Dr. [name], make an appointment and discuss when to resume this medicine. 10/19/17   Earnstine Regal, PA-C  calcium carbonate (CALCIUM 600) 600 MG TABS tablet Resume this as before next week when you are eating better. Patient taking differently: Take 600 mg by mouth daily with breakfast. Resume this as before next week when you are eating better. 10/19/17   Earnstine Regal, PA-C  carvedilol (COREG) 12.5 MG tablet TAKE 1/2 TABLET TWICE DAILY WITH A MEAL 01/04/18   Bensimhon, Shaune Pascal, MD  [employer] VITAMIN A PO Take 2,400 mcg by mouth daily.    [provider]  cyanocobalamin 1000 MCG tablet Take 1,000 mcg by mouth daily.    [provider]  HYDROcodone-acetaminophen (NORCO/VICODIN) 5-325 MG tablet Take 1 tablet by mouth every 4 (four) hours as needed for moderate pain. 11/15/17   Focht, Fraser Din, PA  losartan (COZAAR) 25 MG tablet TAKE 1/2 TABLET BY MOUTH DAILY 12/13/17   Bensimhon, Shaune Pascal, MD  Thiamine Mononitrate (VITAMIN B1  PO) Take 250 mg by mouth daily.    [provider]    Family History Family History  Problem Relation Age of Onset  . Emphysema Father        died @ 69- smoked  . Heart disease Father   . Kidney Stones Father   . Diabetes Mother        died @ 42  . Stroke Mother   . Head & neck cancer Brother   . Lymphoma Brother   . Colon cancer Neg Hx   . Stomach cancer Neg Hx   . Rectal cancer Neg Hx   . Esophageal cancer Neg Hx   . Liver cancer Neg Hx     Social History Social History   Tobacco Use  . Smoking status: Former Smoker    Packs/day: 1.00    Years: 45.00    Pack years: 45.00    Types:  Cigarettes    Last attempt to quit: 06/09/1999    Years since quitting: 18.6  . Smokeless tobacco: Never Used  Substance Use Topics  . Alcohol use: No    Alcohol/week: 0.0 oz  . Drug use: No     Allergies   Percocet [oxycodone-acetaminophen] and Metronidazole   Review of Systems Review of Systems  Constitutional: Negative for chills, diaphoresis, fatigue and fever.  HENT: Negative for congestion, rhinorrhea and sneezing.   Eyes: Negative.   Respiratory: Negative for cough, chest tightness and shortness of breath.   Cardiovascular: Negative for chest pain and leg swelling.  Gastrointestinal: Positive for abdominal pain and nausea. Negative for blood in stool, diarrhea and vomiting.  Genitourinary: Negative for difficulty urinating, flank pain, frequency and hematuria.  Musculoskeletal: Negative for arthralgias and back pain.  Skin: Negative for rash.  Neurological: Negative for dizziness, speech difficulty, weakness, numbness and headaches.     Physical Exam Updated Vital Signs BP (!) 189/96 (BP Location: Left Arm)   Pulse (!) 52 Comment: SB  Temp (!) 97.5 F (36.4 C)   Resp 20   Ht 5' 9.5" (1.765 m)   Wt 52.2 kg (115 lb)   SpO2 90%   BMI 16.74 kg/m   Physical Exam  Constitutional: He is oriented to person, place, and time. He appears well-developed and  well-nourished.  HENT:  Head: Normocephalic and atraumatic.  Eyes: Pupils are equal, round, and reactive to light.  Neck: Normal range of motion. Neck supple.  Cardiovascular: Normal rate, regular rhythm and normal heart sounds.  Pulmonary/Chest: Effort normal and breath sounds normal. No respiratory distress. He has no wheezes. He has no rales. He exhibits no tenderness.  Abdominal: Soft. Bowel sounds are decreased. There is tenderness (Moderate generalized tenderness with tympany to percussion). There is no rebound and no guarding.  Musculoskeletal: Normal range of motion. He exhibits no edema.  Lymphadenopathy:    He has no cervical adenopathy.  Neurological: He is alert and oriented to person, place, and time.  Skin: Skin is warm and dry. No rash noted.  Psychiatric: He has a normal mood and affect.     ED Treatments / Results  Labs (all labs ordered are listed, but only abnormal results are displayed) Labs Reviewed  COMPREHENSIVE METABOLIC PANEL - Abnormal; Notable for the following components:      Result Value   Glucose, Bld 123 (*)    Calcium 8.6 (*)    GFR calc non Af Amer 56 (*)    All other components within normal limits  CBC - Abnormal; Notable for the following components:   Hemoglobin 12.1 (*)    HCT 38.0 (*)    RDW 17.3 (*)    All other components within normal limits  URINALYSIS, ROUTINE W REFLEX MICROSCOPIC - Abnormal; Notable for the following components:   Hgb urine dipstick TRACE (*)    Protein, ur 30 (*)    All other components within normal limits  URINALYSIS, MICROSCOPIC (REFLEX) - Abnormal; Notable for the following components:   Bacteria, UA RARE (*)    Squamous Epithelial / LPF 0-5 (*)    All other components within normal limits  LIPASE, BLOOD    EKG  EKG Interpretation None       Radiology Ct Abdomen Pelvis W Contrast  Result Date: 01/10/2018 CLINICAL DATA:  Abnormal abdomen radiograph suspicious for partial or early SBO and possible  perforated hollow viscus. EXAM: CT ABDOMEN AND PELVIS WITH CONTRAST TECHNIQUE: Multidetector CT imaging of  the abdomen and pelvis was performed using the standard protocol following bolus administration of intravenous contrast. CONTRAST:  171mL ISOVUE-300 IOPAMIDOL (ISOVUE-300) INJECTION 61% COMPARISON:  CT from 11/09/2017 FINDINGS: Lower chest: Normal heart size. Small hiatal hernia. Centrilobular paraseptal emphysema at the lung bases. No effusion or pneumothorax. No pulmonary consolidation. Hepatobiliary: Status post cholecystectomy. Mild intra and extrahepatic ductal dilatation likely secondary to reservoir effect from prior cholecystectomy. No space-occupying mass of the liver. There is colonic interposition over the liver which may have simulated free air in conjunction right basilar emphysematous change. Pancreas: No focal mass.  Mild pancreatic atrophy. Spleen: No splenomegaly or mass. Adrenals/Urinary Tract: Normal bilateral adrenal glands. Symmetric cortical enhancement of both kidneys with mild ectasia of the left renal collecting system in a chronic left UPJ configuration. Nonobstructing calculus in the lower pole of the left kidney is identified measuring 6 x 2 mm. No hydroureteronephrosis. The urinary bladder is physiologically distended. No focal mural thickening of the bladder calculus. Stomach/Bowel: There is swirling of the mesentery in the left hemiabdomen contributing to dilatation of small bowel and consistent with midgut volvulus. Findings are similar to that seen in 2018. Fecalized material is seen within adjacent small bowel. No pneumatosis or free air. Surgical clips are seen near the gastroesophageal junction as before. The stomach is decompressed in appearance. Redemonstration of colonic diverticulosis along the sigmoid colon without acute diverticulitis. Postsurgical change with bowel anastomosis in the left lower quadrant. Vascular/Lymphatic: Aortoiliac atherosclerosis with ectasia. No  aneurysmal dilatation. Mildly enlarged lymph nodes identified in the small bowel mesentery adjacent to the area of swelling vessels, these measure up to 13 mm short axis. Reproductive: Mildly enlarged prostate. Other: Small volume of ascites about the liver, pelvis and right inguinal canal. Musculoskeletal: No acute nor suspicious osseous abnormality. Degenerative disc disease L4-5 and L5-S1. IMPRESSION: 1. 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 No associated free air or pneumatosis. Small volume ascites with mesenteric edema. These results were called by telephone at the time of interpretation on 01/10/2018 at 6:20 pm to Dr. Tamera Punt, who verbally acknowledged these results. 2. 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 3. Aortoiliac atherosclerosis without aneurysm. 4. Bibasilar emphysema.  No free air beneath the diaphragm. Electronically Signed   By: Ashley Royalty M.D.   On: 01/10/2018 18:20   Dg Abd Acute W/chest  Result Date: 01/10/2018 CLINICAL DATA:  Generalized abdominal pain this morning with previous abdominal surgeries due to volvulus. Generalized body aches. EXAM: DG ABDOMEN ACUTE W/ 1V CHEST COMPARISON:  Chest x-ray 11/11/2017 FINDINGS: Lungs are adequately inflated with mild patchy density over the right midlung and lung bases with minimal prominence of the perihilar markings as findings may be due to edema versus atelectasis or infection. No evidence of effusion. Cardiomediastinal silhouette and remainder of the chest is unchanged. Abdominopelvic images demonstrate findings suggesting a small amount of right subdiaphragmatic free peritoneal air. There are multiple air-filled loops of large and small bowel. There are several air-filled dilated small bowel loops with differential air-fluid levels present. There are surgical clips  over the epigastric region and right upper quadrant. Mild degenerate change of the spine and hips. IMPRESSION: Air-filled dilated small bowel loops with air-fluid levels suggesting early/partial small-bowel obstruction. Findings suggesting small amount of right subdiaphragmatic free peritoneal air concerning for perforated viscus. Recommend further evaluation with CT of the abdomen and  pelvis. Patchy opacification over the right midlung and lung bases which may be due to edema versus atelectasis or infection Critical Value/emergent results were called by telephone at the time of interpretation on 01/10/2018 at 5:24 pm to Dr. Billy Fischer, who verbally acknowledged these results. Electronically Signed   By: Marin Olp M.D.   On: 01/10/2018 17:25    Procedures Procedures (including critical care time)  Medications Ordered in ED Medications  promethazine (PHENERGAN) injection 6.25-12.5 mg (not administered)  HYDROmorphone (DILAUDID) injection 0.25-0.5 mg (not administered)  lactated ringers infusion (not administered)  levalbuterol (XOPENEX) 1.25 MG/0.5ML nebulizer solution (not administered)  furosemide (LASIX) 10 MG/ML injection (not administered)  fentaNYL (SUBLIMAZE) injection 50 mcg (50 mcg Intravenous Given 01/10/18 1652)  ondansetron (ZOFRAN) injection 4 mg (4 mg Intravenous Given 01/10/18 1652)  fentaNYL (SUBLIMAZE) injection 50 mcg (50 mcg Intravenous Given 01/10/18 1747)  iopamidol (ISOVUE-300) 61 % injection 100 mL (100 mLs Intravenous Contrast Given 01/10/18 1737)  piperacillin-tazobactam (ZOSYN) IVPB 3.375 g (0 g Intravenous Stopped 01/10/18 1839)  morphine 4 MG/ML injection 4 mg (4 mg Intravenous Given 01/10/18 1843)  ondansetron (ZOFRAN) injection 4 mg (4 mg Intravenous Given 01/10/18 1954)  levalbuterol (XOPENEX) nebulizer solution 1.25 mg (1.25 mg Nebulization Given 01/10/18 2300)  furosemide (LASIX) injection 40 mg (40 mg Intravenous Given 01/10/18 2300)     Initial Impression /  Assessment and Plan / ED Course  I have reviewed the triage vital signs and the nursing notes.  Pertinent labs & imaging results that were available during my care of the patient were reviewed by me and considered in my medical decision making (see chart for details).     Patient is a 84 year old male who presents with abdominal pain.  He has a recent history of midgut volvulus x2 requiring exploratory laparotomy x2.  Initial x-rays show free air concerning for perforated bowel as well as obstruction.  He was started on IV Zosyn.  I spoke with Dr. Marlou Starks with general surgery who will see the patient at [facility] the emergency department.  Follow-up CT scan does not show any evidence of a perforation however it is confirmed that he has a recurrence of the mid gut volvulus.  I spoke with Dr. Sherry Ruffing who has accepted the pt for transfer to [facility].  Final Clinical Impressions(s) / ED Diagnoses   Final diagnoses:  Perforated bowel ([place])  Volvulus [facility])    ED Discharge Orders    None       Malvin Johns, MD 01/10/18 2344

## 2018-01-10 NOTE — Anesthesia Preprocedure Evaluation (Addendum)
Anesthesia Evaluation  Patient identified by MRN, date of birth, ID band Patient awake    Reviewed: Allergy & Precautions, NPO status , Patient's Chart, lab work & pertinent test results  Airway Mallampati: II  TM Distance: >3 FB Neck ROM: Full    Dental no notable dental hx.    Pulmonary COPD, former smoker,  O2 sat 78% on 3Lnc   Pulmonary exam normal breath sounds clear to auscultation       Cardiovascular + CAD, + Cardiac Stents, + Peripheral Vascular Disease and +CHF  Normal cardiovascular exam Rhythm:Regular Rate:Normal  Left ventricle: The cavity size was mildly dilated. Systolic   function was moderately to severely reduced. The estimated   ejection fraction was in the range of 30% to 35%. Diffuse   hypokinesis. Akinesis of the mid-apicalanteroseptal and apical   myocardium. Features are consistent with a pseudonormal left   ventricular filling pattern, with concomitant abnormal relaxation   and increased filling pressure (grade 2 diastolic dysfunction).   Doppler parameters are consistent with high ventricular filling   pressure. - Aortic valve: Transvalvular velocity was within the normal range.   There was no stenosis. There was mild regurgitation. - Mitral valve: Calcified annulus. Transvalvular velocity was   within the normal range. There was no evidence for stenosis.   There was mild regurgitation. - Left atrium: The atrium was mildly dilated. - Right ventricle: The cavity size was normal. Wall thickness was   normal. Systolic function was normal. - Tricuspid valve: There was trivial regurgitation. - Pulmonary arteries: Systolic pressure was within the normal   range. PA peak pressure: 33 mm Hg (S).   Neuro/Psych negative neurological ROS  negative psych ROS   GI/Hepatic negative GI ROS, Neg liver ROS,   Endo/Other  negative endocrine ROS  Renal/GU negative Renal ROS  negative genitourinary    Musculoskeletal negative musculoskeletal ROS (+)   Abdominal   Peds negative pediatric ROS (+)  Hematology negative hematology ROS (+)   Anesthesia Other Findings   Reproductive/Obstetrics negative OB ROS                           Anesthesia Physical Anesthesia Plan  ASA: III and emergent  Anesthesia Plan: General   Post-op Pain Management:    Induction: Intravenous and Rapid sequence  PONV Risk Score and Plan: 2 and Ondansetron, Dexamethasone and Treatment may vary due to age or medical condition  Airway Management Planned: Oral ETT  Additional Equipment:   Intra-op Plan:   Post-operative Plan: Possible Post-op intubation/ventilation  Informed Consent: I have reviewed the patients History and Physical, chart, labs and discussed the procedure including the risks, benefits and alternatives for the proposed anesthesia with the patient or authorized representative who has indicated his/her understanding and acceptance.   Dental advisory given  Plan Discussed with: CRNA and Surgeon  Anesthesia Plan Comments:         Anesthesia Quick Evaluation

## 2018-01-10 NOTE — ED Triage Notes (Signed)
Patient states that he has had pain to his generalized abdominal pain. The patient denies any N/V/D  - the patient has had recent Bowel surgery

## 2018-01-10 NOTE — Op Note (Signed)
01/10/2018  10:55 PM  PATIENT:  [name] [name]  84 y.o. male  PRE-OPERATIVE DIAGNOSIS:  midgut volvulus  POST-OPERATIVE DIAGNOSIS:  midgut volvulus  PROCEDURE:  Procedure(s): exploratory laparotomy, lysis of adhesions, decompression of midgut volvulus (N/A)  SURGEON:  Surgeon(s) and Role:    Jovita Kussmaul, MD - Primary  PHYSICIAN ASSISTANT:   ASSISTANTS: none   ANESTHESIA:   general  EBL:  25 mL   BLOOD ADMINISTERED:none  DRAINS: none   LOCAL MEDICATIONS USED:  NONE  SPECIMEN:  No Specimen  DISPOSITION OF SPECIMEN:  N/A  COUNTS:  YES  TOURNIQUET:  * No tourniquets in log *  DICTATION: .Dragon Dictation   After informed consent was obtained the patient was brought to the operating room and placed in the supine position on the operating table.  After adequate induction of general anesthesia the patient's abdomen was prepped with ChloraPrep, allowed to dry, and draped in usual sterile manner.  An appropriate timeout was performed.  A midline incision was made along his old scar with a 10 blade knife.  The incision was carried through the skin and subcutaneous tissue sharply with the electrocautery until the fascia of the abdominal wall was encountered.  The fascia was carefully incised with the electrocautery.  The dissection was carried sharply through the fascia until access was gained to the abdominal cavity.  Care was taken to avoid any injury to the bowel.  The rest of the incision was opened under direct vision.  There was some omental adhesion to the anterior abdominal wall which was taken down sharply with electrocautery.  I was unable to run the small bowel from the ligament of Treitz to the ileocecal valve.  There did appear to be a mid gut volvulus as there was before.  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  The small bowel was rotated in a counterclockwise fashion and the volvulus was released.  Once this was accomplished the bowel looked pink and healthy.  Although the  small bowel did not look like there was a definite malrotation there did appear to be some bands that seem to keep the first part of the jejunum towards the right side of the abdominal cavity.  These filmy adhesions were incised sharply with the electrocautery so that the proximal jejunum could lay more in the left upper quadrant.  The small bowel was then placed back in the abdominal cavity the gently and what appeared to be a favorable orientation.  The rest of the abdomen was inspected and no other abnormalities were noted.  The fascia of the anterior abdominal wall was then closed with 2 running #1 double-stranded looped PDS sutures.  The skin was mobilized and closed with staples.  Sterile dressings were applied.  The patient tolerated the procedure well.  At the end of the case all needle sponge and instrument counts were correct.  The patient was then awakened and taken to recovery in stable condition.  PLAN OF CARE: Admit to inpatient   PATIENT DISPOSITION:  PACU - hemodynamically stable.   Delay start of Pharmacological VTE agent (>24hrs) due to surgical blood loss or risk of bleeding: no

## 2018-01-11 ENCOUNTER — Encounter (HOSPITAL_COMMUNITY): Payer: Self-pay | Admitting: General Surgery

## 2018-01-11 LAB — BASIC METABOLIC PANEL
Anion gap: 10 (ref 5–15)
BUN: 18 mg/dL (ref 6–20)
CO2: 22 mmol/L (ref 22–32)
Calcium: 8.1 mg/dL — ABNORMAL LOW (ref 8.9–10.3)
Chloride: 106 mmol/L (ref 101–111)
Creatinine, Ser: 1.35 mg/dL — ABNORMAL HIGH (ref 0.61–1.24)
GFR calc Af Amer: 54 mL/min — ABNORMAL LOW (ref >60)
GFR calc non Af Amer: 46 mL/min — ABNORMAL LOW (ref >60)
Glucose, Bld: 170 mg/dL — ABNORMAL HIGH (ref 65–99)
Potassium: 3.9 mmol/L (ref 3.5–5.1)
Sodium: 138 mmol/L (ref 135–145)

## 2018-01-11 LAB — CBC
HCT: 37.8 % — ABNORMAL LOW (ref 39.0–52.0)
Hemoglobin: 11.8 g/dL — ABNORMAL LOW (ref 13.0–17.0)
MCH: 28 pg (ref 26.0–34.0)
MCHC: 31.2 g/dL (ref 30.0–36.0)
MCV: 89.8 fL (ref 78.0–100.0)
Platelets: 178 10*3/uL (ref 150–400)
RBC: 4.21 MIL/uL — ABNORMAL LOW (ref 4.22–5.81)
RDW: 17.3 % — ABNORMAL HIGH (ref 11.5–15.5)
WBC: 18.5 10*3/uL — ABNORMAL HIGH (ref 4.0–10.5)

## 2018-01-11 LAB — MRSA PCR SCREENING: MRSA by PCR: NEGATIVE

## 2018-01-11 MED ORDER — MORPHINE SULFATE (PF) 4 MG/ML IV SOLN
1.0000 mg | INTRAVENOUS | Status: DC | PRN
  Administered 2018-01-11 – 2018-01-12 (×7): 1 mg via INTRAVENOUS
  Administered 2018-01-13 – 2018-01-14 (×2): 2 mg via INTRAVENOUS
  Filled 2018-01-11 (×9): qty 1

## 2018-01-11 MED ORDER — METHOCARBAMOL 1000 MG/10ML IJ SOLN
500.0000 mg | Freq: Three times a day (TID) | INTRAVENOUS | Status: DC | PRN
  Administered 2018-01-11 – 2018-01-12 (×3): 500 mg via INTRAVENOUS
  Filled 2018-01-11 (×3): qty 5
  Filled 2018-01-11 (×2): qty 550

## 2018-01-11 MED ORDER — ONDANSETRON HCL 4 MG/2ML IJ SOLN: 4.0000 mg | Freq: Four times a day (QID) | INTRAMUSCULAR | Status: DC | PRN

## 2018-01-11 MED ORDER — HEPARIN SODIUM (PORCINE) 5000 UNIT/ML IJ SOLN
5000.0000 [IU] | Freq: Three times a day (TID) | INTRAMUSCULAR | Status: DC
  Administered 2018-01-11 – 2018-01-16 (×16): 5000 [IU] via SUBCUTANEOUS
  Filled 2018-01-11 (×15): qty 1

## 2018-01-11 MED ORDER — ONDANSETRON 4 MG PO TBDP: 4.0000 mg | ORAL_TABLET | Freq: Four times a day (QID) | ORAL | Status: DC | PRN

## 2018-01-11 MED ORDER — KCL IN DEXTROSE-NACL 20-5-0.9 MEQ/L-%-% IV SOLN
INTRAVENOUS | Status: DC
  Administered 2018-01-11 – 2018-01-14 (×9): via INTRAVENOUS
  Filled 2018-01-11 (×11): qty 1000

## 2018-01-11 MED ORDER — CHLORHEXIDINE GLUCONATE 0.12 % MT SOLN
15.0000 mL | Freq: Two times a day (BID) | OROMUCOSAL | Status: DC
  Administered 2018-01-11 – 2018-01-16 (×11): 15 mL via OROMUCOSAL
  Filled 2018-01-11 (×8): qty 15

## 2018-01-11 MED ORDER — PANTOPRAZOLE SODIUM 40 MG IV SOLR
40.0000 mg | Freq: Every day | INTRAVENOUS | Status: DC
  Administered 2018-01-11 – 2018-01-14 (×5): 40 mg via INTRAVENOUS
  Filled 2018-01-11 (×6): qty 40

## 2018-01-11 MED ORDER — ORAL CARE MOUTH RINSE
15.0000 mL | Freq: Two times a day (BID) | OROMUCOSAL | Status: DC
  Administered 2018-01-11 – 2018-01-15 (×6): 15 mL via OROMUCOSAL

## 2018-01-11 NOTE — Progress Notes (Signed)
1 Day Post-Op    CC: Generalized acute abdominal pain.  Subjective: He really looks pretty good, back and shoulder pain on the right, better with heat.  Incision OK, not allot of NG drainage. NO BS. Taking ice chips.  Objective: Vital signs in last 24 hours: Temp:  [97.3 F (36.3 C)-98.3 F (36.8 C)] 97.3 F (36.3 C) (02/22 0800) Pulse Rate:  [51-91] 60 (02/22 0800) Resp:  [9-22] 14 (02/22 0800) BP: (121-217)/(48-199) 129/54 (02/22 0800) SpO2:  [77 %-97 %] 90 % (02/22 0800) Weight:  [52.2 kg (115 lb)-54.3 kg (119 lb 11.4 oz)] 54.3 kg (119 lb 11.4 oz) (02/22 0030) Last BM Date: (PTA) N.p.o. 2170 IV 2040 urine 300 NG Afebrile vital signs are stable. Glucose is 170 creatinine is up to 1.35 WBC 18.5 H/H stable 11.8/37.8.  Intake/Output from previous day: 02/21 0701 - 02/22 0700 In: 2270 [I.V.:2150; NG/GT:70; IV Piggyback:50] Out: 2365 [Urine:2040; Emesis/NG output:300; Blood:25] Intake/Output this shift: Total I/O In: 200 [I.V.:200] Out: -   General appearance: alert, cooperative and no distress Resp: clear to auscultation bilaterally GI: soft, no BS, wound OK, Waffle dressing in place.  Lab Results:  Recent Labs    01/10/18 1637 01/11/18 0256  WBC 9.7 18.5*  HGB 12.1* 11.8*  HCT 38.0* 37.8*  PLT 178 178    BMET Recent Labs    01/10/18 1637 01/11/18 0256  NA 138 138  K 3.6 3.9  CL 106 106  CO2 22 22  GLUCOSE 123* 170*  BUN 16 18  CREATININE 1.16 1.35*  CALCIUM 8.6* 8.1*   PT/INR No results for input(s): LABPROT, INR in the last 72 hours.  Recent Labs  Lab 01/10/18 1637  AST 35  ALT 28  ALKPHOS 115  BILITOT 0.3  PROT 8.0  ALBUMIN 3.6     Lipase     Component Value Date/Time   LIPASE 28 01/10/2018 1637   Prior to Admission medications   Medication Sig Start Date End Date Taking? Authorizing Provider  acetaminophen (TYLENOL) 325 MG tablet You can take plain Tylenol 2 tablets every 4 hours for mild pain.  You can take the prescribed pain  medicine for pain not relieved by plain Tylenol.  DO NOT TAKE MORE THAN 4000 MG OF TYLENOL PER DAY.  IT CAN HARM YOUR LIVER.  TYLENOL (ACETAMINOPHEN) IS ALSO IN YOUR PRESCRIPTION PAIN MEDICATION.  YOU HAVE TO COUNT IT IN YOUR DAILY TOTAL. 10/19/17  Yes Earnstine Regal, PA-C  Ascorbic Acid (VITAMIN C PO) Take 1,000 mg by mouth daily.    Yes [provider]  aspirin EC 81 MG tablet Take 1 tablet (81 mg total) by mouth daily. 10/02/13  Yes Larey Dresser, MD  BOSULIF 100 MG tablet Call Dr. [name], make an appointment and discuss when to resume this medicine. Patient taking differently: Take 400 mg by mouth daily with breakfast. Call Dr. [name], make an appointment and discuss when to resume this medicine. 10/19/17  Yes Earnstine Regal, PA-C  calcium carbonate (CALCIUM 600) 600 MG TABS tablet Resume this as before next week when you are eating better. Patient taking differently: Take 600 mg by mouth daily with breakfast. Resume this as before next week when you are eating better. 10/19/17  Yes Earnstine Regal, PA-C  carvedilol (COREG) 12.5 MG tablet TAKE 1/2 TABLET TWICE DAILY WITH A MEAL 01/04/18  Yes Bensimhon, Shaune Pascal, MD  [facility] VITAMIN A PO Take 2,400 mcg by mouth daily.   Yes [provider]  cyanocobalamin 1000  MCG tablet Take 1,000 mcg by mouth daily.   Yes [provider]  losartan (COZAAR) 25 MG tablet TAKE 1/2 TABLET BY MOUTH DAILY 12/13/17  Yes Bensimhon, Shaune Pascal, MD  naproxen sodium (ALEVE) 220 MG tablet Take 440 mg by mouth 2 (two) times daily as needed (pain).   Yes [provider]  Thiamine Mononitrate (VITAMIN B1 PO) Take 250 mg by mouth daily.   Yes [provider]  HYDROcodone-acetaminophen (NORCO/VICODIN) 5-325 MG tablet Take 1 tablet by mouth every 4 (four) hours as needed for moderate pain. Patient not taking: Reported on 01/11/2018 11/15/17   Kalman Drape, PA      Medications: . chlorhexidine  15 mL Mouth Rinse BID  . heparin   5,000 Units Subcutaneous Q8H  . mouth rinse  15 mL Mouth Rinse q12n4p  . pantoprazole (PROTONIX) IV  40 mg Intravenous QHS   . dextrose 5 % and 0.9 % NaCl with KCl 20 mEq/L 100 mL/hr at 01/11/18 0109  . methocarbamol (ROBAXIN)  IV     Anti-infectives (From admission, onward)   Start     Dose/Rate Route Frequency Ordered Stop   01/10/18 1745  piperacillin-tazobactam (ZOSYN) IVPB 3.375 g     3.375 g 100 mL/hr over 30 Minutes Intravenous  Once 01/10/18 1741 01/10/18 1839      Assessment/Plan Coronary artery disease with prior LAD stent Hx of systolic CHF (EF 70-62%, grade I diastolic dysfunction, Mild to Mod AR) Chronic kidney disease stage III  - creatinine 1.16 admit =>> 1.35 today COPD/Emphysema  Chronic myelocytic leukemia - oral chemotherapy daily Carotid artery stenosis with prior carotid endarterectomy Prior small bowel obstruction reduction of volvulus 11/09/2017 Dr. Donne Hazel S/P exploratory laparotomy for small bowel obstruction 10/14/2017 Dr. Excell Seltzer   Midgut volvulus Exploratory laparotomy, lysis of adhesions, decompression of midgut volvulus, 01/10/18 Dr. Autumn Messing III POD 1 Leukocytosis - WBC 18.5 POD1/CML  FEN: IV fluids/n.p.o. ID: Preop Zosyn DVT: Heparin Foley: out but has not voided yet Follow-up: Dr. Marlou Starks  LOS: 1 day    Plan;  Continue NG, OOB, watch here today. Can be stepdown status.  OOB to chair, K pad to back and shoulder.  Await bowel function.  Recheck labs in AM.  Continue fluids at 100 for now due to cardiac issues, watch creatinine.    Earnstine Regal 01/11/[id number]

## 2018-01-11 NOTE — Progress Notes (Signed)
Initial Nutrition Assessment  DOCUMENTATION CODES:   Severe malnutrition in context of chronic illness, Underweight  INTERVENTION:   - Magic cup TID with meals once diet is advanced, each supplement provides 290 kcal and 9 grams of protein. - Order snacks BID once diet is advanced. - Encourage PO intake.  NUTRITION DIAGNOSIS:   Severe Malnutrition related to chronic illness(CKD stage III, leukemia, CHF) as evidenced by severe muscle depletion, severe fat depletion, percent weight loss.  GOAL:   Patient will meet greater than or equal to 90% of their needs  MONITOR:   PO intake, Supplement acceptance, Diet advancement, Weight trends  REASON FOR ASSESSMENT:   Malnutrition Screening Tool, Other (Comment)(Low BMI)   ASSESSMENT:   84 year old male with recent midgut volvulus x2 requiring exploratory laparotomy in 2018. Presented to ED with abdominal pain, nausea, and vomiting. PMH significant for CHF, CKD stage 3, CAD, and HLD. Pt is s/p exploratory laparotomy, lysis of adhesions, and decompression of midgut volvulus on 01/10/18.   Pt with NGT to low intermittent suction with 300 mL output so far today.  Spoke with pt who reports having a good appetite PTA. Pt states that he is trying to eat to "gain back weight" and has been successful in gaining 2-3 pounds. Pt reports a typical breakfast as 2 eggs, bacon, grits, toast, an orange, and coffee. A typical lunch might include soup, crackers, and an ice cream sandwich. Dinners are typically balanced with a protein, starch, and vegetable. Pt does snack in between meals on milk, crackers, and cheese. Pt drinks a supplement every so often (states they make him "bloat") and primarily drinks water, milk, and sweet tea.  Pt agreeable to receiving Magic Cup with meal trays once diet is advanced. RD to order.  Pt states he has lost about 40 lbs in the past 3 years. Pt states that prior to his diagnosis of leukemia, his UBW was 150-155 lbs and  that he stayed this weight for 40 years. Per weight history in chart, pt has lost 11 lbs in 3 months (8.5% weight loss, significant for timeframe).  Provided pt with "High-Calorie, High-Protein Nutrition Therapy" handout from the Academy of [employer] Dietetics.  Pt lives at home currently alone as his wife is at [employer]. Pt and wife cook at home.  Medications reviewed and include: 40 mg IV Protonix daily, D5 and saline with KCl 20 mEq/L infusing at 100 mL/hour  Labs reviewed: creatinine 1.35 (H), calcium 8.1 (L)  NUTRITION - FOCUSED PHYSICAL EXAM:    Most Recent Value  Orbital Region  Severe depletion  Upper Arm Region  Moderate depletion  Thoracic and Lumbar Region  Severe depletion  Buccal Region  Moderate depletion  Temple Region  Moderate depletion  Clavicle Bone Region  Severe depletion  Clavicle and Acromion Bone Region  Severe depletion  Scapular Bone Region  Moderate depletion  Dorsal Hand  Severe depletion  Patellar Region  Severe depletion  Anterior Thigh Region  Severe depletion  Posterior Calf Region  Severe depletion  Edema (RD Assessment)  None  Hair  Reviewed  Eyes  Reviewed  Mouth  Reviewed  Skin  Reviewed  Nails  Reviewed       Diet Order:  Diet NPO time specified Except for: [employer], Sips with Meds  EDUCATION NEEDS:   Education needs have been addressed  Skin:  Skin Assessment: Skin Integrity Issues: Skin Integrity Issues:: Incisions Incisions: closed surgicial incisions to abdomen  Last BM:  PTA  Height:  Ht Readings from Last 1 Encounters:  01/11/18 5\' 9"  (1.753 m)    Weight:   Wt Readings from Last 1 Encounters:  01/11/18 119 lb 11.4 oz (54.3 kg)    Ideal Body Weight:  72.7 kg  BMI:  Body mass index is 17.68 kg/m.  Estimated Nutritional Needs:   Kcal:  1600-1800 kcal/day  Protein:  75-90 grams/day  Fluid:  >1.8 L/day    Gaynell Face, MS, RD, LDN Pager: [phone] Weekend/After Hours: [phone]

## 2018-01-12 LAB — CBC
HCT: 32.7 % — ABNORMAL LOW (ref 39.0–52.0)
Hemoglobin: 10.5 g/dL — ABNORMAL LOW (ref 13.0–17.0)
MCH: 28.9 pg (ref 26.0–34.0)
MCHC: 32.1 g/dL (ref 30.0–36.0)
MCV: 90.1 fL (ref 78.0–100.0)
Platelets: 150 10*3/uL (ref 150–400)
RBC: 3.63 MIL/uL — ABNORMAL LOW (ref 4.22–5.81)
RDW: 17.3 % — ABNORMAL HIGH (ref 11.5–15.5)
WBC: 12.2 10*3/uL — ABNORMAL HIGH (ref 4.0–10.5)

## 2018-01-12 LAB — BASIC METABOLIC PANEL
Anion gap: 10 (ref 5–15)
BUN: 22 mg/dL — ABNORMAL HIGH (ref 6–20)
CO2: 22 mmol/L (ref 22–32)
Calcium: 7.9 mg/dL — ABNORMAL LOW (ref 8.9–10.3)
Chloride: 108 mmol/L (ref 101–111)
Creatinine, Ser: 1.35 mg/dL — ABNORMAL HIGH (ref 0.61–1.24)
GFR calc Af Amer: 54 mL/min — ABNORMAL LOW (ref >60)
GFR calc non Af Amer: 46 mL/min — ABNORMAL LOW (ref >60)
Glucose, Bld: 118 mg/dL — ABNORMAL HIGH (ref 65–99)
Potassium: 4.3 mmol/L (ref 3.5–5.1)
Sodium: 140 mmol/L (ref 135–145)

## 2018-01-12 NOTE — Progress Notes (Signed)
2 Days Post-Op   Subjective/Chief Complaint: No flatus feels bloated    Objective: Vital signs in last 24 hours: Temp:  [97.5 F (36.4 C)-99.3 F (37.4 C)] 97.5 F (36.4 C) (02/23 0800) Pulse Rate:  [55-72] 59 (02/23 0700) Resp:  [12-24] 16 (02/23 0700) BP: (125-184)/(53-112) 147/57 (02/23 0700) SpO2:  [88 %-97 %] 96 % (02/23 0700) Last BM Date: (PTA)  Intake/Output from previous day: 02/22 0701 - 02/23 0700 In: 2710 [I.V.:2600; IV Piggyback:110] Out: 1750 [Urine:1300; Emesis/NG output:450] Intake/Output this shift: No intake/output data recorded.  Incision/Wound:CDI staples in place  Distended some BS   Lab Results:  Recent Labs    01/11/18 0256 01/12/18 0336  WBC 18.5* 12.2*  HGB 11.8* 10.5*  HCT 37.8* 32.7*  PLT 178 150   BMET Recent Labs    01/11/18 0256 01/12/18 0336  NA 138 140  K 3.9 4.3  CL 106 108  CO2 22 22  GLUCOSE 170* 118*  BUN 18 22*  CREATININE 1.35* 1.35*  CALCIUM 8.1* 7.9*   PT/INR No results for input(s): LABPROT, INR in the last 72 hours. ABG No results for input(s): PHART, HCO3 in the last 72 hours.  Invalid input(s): PCO2, PO2  Studies/Results: X-ray Abdomen Ap  Result Date: 01/10/2018 CLINICAL DATA:  NG tube placement EXAM: ABDOMEN - 1 VIEW COMPARISON:  CT 01/10/2018, radiographs 01/10/2017 FINDINGS: Esophageal to tip and side-port project over the proximal stomach. Gaseous enlargement of small and large bowel. Interval cutaneous staples at the midline. Probable small amount of postoperative air in the right upper quadrant. IMPRESSION: 1. Esophageal tube tip and side-port project over proximal stomach 2. Continued gaseous enlargement of small and large bowel. Suspected small amount of postoperative air in the right upper quadrant. Electronically Signed   By: Donavan Foil M.D.   On: 01/10/2018 23:49   Ct Abdomen Pelvis W Contrast  Result Date: 01/10/2018 CLINICAL DATA:  Abnormal abdomen radiograph suspicious for partial or early  SBO and possible perforated hollow viscus. EXAM: CT ABDOMEN AND PELVIS WITH CONTRAST TECHNIQUE: Multidetector CT imaging of the abdomen and pelvis was performed using the standard protocol following bolus administration of intravenous contrast. CONTRAST:  159mL ISOVUE-300 IOPAMIDOL (ISOVUE-300) INJECTION 61% COMPARISON:  CT from 11/09/2017 FINDINGS: Lower chest: Normal heart size. Small hiatal hernia. Centrilobular paraseptal emphysema at the lung bases. No effusion or pneumothorax. No pulmonary consolidation. Hepatobiliary: Status post cholecystectomy. Mild intra and extrahepatic ductal dilatation likely secondary to reservoir effect from prior cholecystectomy. No space-occupying mass of the liver. There is colonic interposition over the liver which may have simulated free air in conjunction right basilar emphysematous change. Pancreas: No focal mass.  Mild pancreatic atrophy. Spleen: No splenomegaly or mass. Adrenals/Urinary Tract: Normal bilateral adrenal glands. Symmetric cortical enhancement of both kidneys with mild ectasia of the left renal collecting system in a chronic left UPJ configuration. Nonobstructing calculus in the lower pole of the left kidney is identified measuring 6 x 2 mm. No hydroureteronephrosis. The urinary bladder is physiologically distended. No focal mural thickening of the bladder calculus. Stomach/Bowel: There is swirling of the mesentery in the left hemiabdomen contributing to dilatation of small bowel and consistent with midgut volvulus. Findings are similar to that seen in 2018. Fecalized material is seen within adjacent small bowel. No pneumatosis or free air. Surgical clips are seen near the gastroesophageal junction as before. The stomach is decompressed in appearance. Redemonstration of colonic diverticulosis along the sigmoid colon without acute diverticulitis. Postsurgical change with bowel anastomosis in the  left lower quadrant. Vascular/Lymphatic: Aortoiliac atherosclerosis  with ectasia. No aneurysmal dilatation. Mildly enlarged lymph nodes identified in the small bowel mesentery adjacent to the area of swelling vessels, these measure up to 13 mm short axis. Reproductive: Mildly enlarged prostate. Other: Small volume of ascites about the liver, pelvis and right inguinal canal. Musculoskeletal: No acute nor suspicious osseous abnormality. Degenerative disc disease L4-5 and L5-S1. IMPRESSION: 1. 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 No associated free air or pneumatosis. Small volume ascites with mesenteric edema. These results were called by telephone at the time of interpretation on 01/10/2018 at 6:20 pm to Dr. Tamera Punt, who verbally acknowledged these results. 2. 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 3. Aortoiliac atherosclerosis without aneurysm. 4. Bibasilar emphysema.  No free air beneath the diaphragm. Electronically Signed   By: Ashley Royalty M.D.   On: 01/10/2018 18:20   Dg Abd Acute W/chest  Result Date: 01/10/2018 CLINICAL DATA:  Generalized abdominal pain this morning with previous abdominal surgeries due to volvulus. Generalized body aches. EXAM: DG ABDOMEN ACUTE W/ 1V CHEST COMPARISON:  Chest x-ray 11/11/2017 FINDINGS: Lungs are adequately inflated with mild patchy density over the right midlung and lung bases with minimal prominence of the perihilar markings as findings may be due to edema versus atelectasis or infection. No evidence of effusion. Cardiomediastinal silhouette and remainder of the chest is unchanged. Abdominopelvic images demonstrate findings suggesting a small amount of right subdiaphragmatic free peritoneal air. There are multiple air-filled loops of large and small bowel. There are several air-filled dilated small bowel loops with differential air-fluid levels present. There are  surgical clips over the epigastric region and right upper quadrant. Mild degenerate change of the spine and hips. IMPRESSION: Air-filled dilated small bowel loops with air-fluid levels suggesting early/partial small-bowel obstruction. Findings suggesting small amount of right subdiaphragmatic free peritoneal air concerning for perforated viscus. Recommend further evaluation with CT of the abdomen and pelvis. Patchy opacification over the right midlung and lung bases which may be due to edema versus atelectasis or infection Critical Value/emergent results were called by telephone at the time of interpretation on 01/10/2018 at 5:24 pm to Dr. Billy Fischer, who verbally acknowledged these results. Electronically Signed   By: Marin Olp M.D.   On: 01/10/2018 17:25    Anti-infectives: Anti-infectives (From admission, onward)   Start     Dose/Rate Route Frequency Ordered Stop   01/10/18 1745  piperacillin-tazobactam (ZOSYN) IVPB 3.375 g     3.375 g 100 mL/hr over 30 Minutes Intravenous  Once 01/10/18 1741 01/10/18 1839      Assessment/Plan: s/p Procedure(s): exploratory laparotomy, lysis of adhesions, decompression of midgut volvulus (N/A)  No GI function yet  Await return OOB and keep NGT until he passes flatus   LOS: 2 days    Marcello Moores A Dlynn Ranes 01/12/2018

## 2018-01-13 NOTE — Progress Notes (Signed)
3 Days Post-Op   Subjective/Chief Complaint: Pt feels ok no flatus but less bloated    Objective: Vital signs in last 24 hours: Temp:  [97.1 F (36.2 C)-97.7 F (36.5 C)] 97.7 F (36.5 C) (02/24 0400) Pulse Rate:  [55-72] 57 (02/24 0600) Resp:  [14-21] 14 (02/24 0600) BP: (134-181)/(54-78) 146/59 (02/24 0600) SpO2:  [88 %-98 %] 92 % (02/24 0600) Last BM Date: (PTA)  Intake/Output from previous day: 02/23 0701 - 02/24 0700 In: 2460 [P.O.:160; I.V.:2300] Out: 2200 [Urine:1200; Emesis/NG output:1000] Intake/Output this shift: No intake/output data recorded.  Incision/Wound:CDI  Much less distended  Soft BS   Lab Results:  Recent Labs    01/11/18 0256 01/12/18 0336  WBC 18.5* 12.2*  HGB 11.8* 10.5*  HCT 37.8* 32.7*  PLT 178 150   BMET Recent Labs    01/11/18 0256 01/12/18 0336  NA 138 140  K 3.9 4.3  CL 106 108  CO2 22 22  GLUCOSE 170* 118*  BUN 18 22*  CREATININE 1.35* 1.35*  CALCIUM 8.1* 7.9*   PT/INR No results for input(s): LABPROT, INR in the last 72 hours. ABG No results for input(s): PHART, HCO3 in the last 72 hours.  Invalid input(s): PCO2, PO2  Studies/Results: No results found.  Anti-infectives: Anti-infectives (From admission, onward)   Start     Dose/Rate Route Frequency Ordered Stop   01/10/18 1745  piperacillin-tazobactam (ZOSYN) IVPB 3.375 g     3.375 g 100 mL/hr over 30 Minutes Intravenous  Once 01/10/18 1741 01/10/18 1839      Assessment/Plan: s/p Procedure(s): exploratory laparotomy, lysis of adhesions, decompression of midgut volvulus (N/A) To floor D/C NGT Ice chips  OOB  LOS: 3 days    Marcello Moores A Jolaine Fryberger 01/13/2018

## 2018-01-13 NOTE — Care Management Important Message (Signed)
Important Message  Patient Details  Name: [name] [name] MRN: [id number] Date of Birth: [birth year]   Medicare Important Message Given:  Yes    Apolonio Schneiders, RN 01/13/2018, 10:41 AM

## 2018-01-13 NOTE — Progress Notes (Signed)
Pt. Hypertensive with systolic BP in the high 102'T. Paged PA twice. Awaiting further orders.  Hand off report called to RN.

## 2018-01-13 NOTE — Progress Notes (Signed)
Called to give report. Was told receiving RN will call back  ( in a patient's room )

## 2018-01-14 DIAGNOSIS — E43 Unspecified severe protein-calorie malnutrition: Secondary | ICD-10-CM

## 2018-01-14 NOTE — Anesthesia Postprocedure Evaluation (Signed)
Anesthesia Post Note  Patient: [name] [name]  Procedure(s) Performed: exploratory laparotomy, lysis of adhesions, decompression of midgut volvulus (N/A )     Patient location during evaluation: PACU Anesthesia Type: General Level of consciousness: awake and alert Pain management: pain level controlled Vital Signs Assessment: post-procedure vital signs reviewed and stable Respiratory status: spontaneous breathing, nonlabored ventilation, respiratory function stable and patient connected to nasal cannula oxygen Cardiovascular status: blood pressure returned to baseline and stable Postop Assessment: no apparent nausea or vomiting Anesthetic complications: no    Last Vitals:  Vitals:   01/13/18 2020 01/14/18 0429  BP: 130/64 136/64  Pulse: 60 61  Resp: 14 12  Temp: 36.4 C 36.6 C  SpO2: 95% 96%    Last Pain:  Vitals:   01/14/18 0429  TempSrc: Oral  PainSc:                  [name]

## 2018-01-14 NOTE — Evaluation (Signed)
PT Cancellation Note  Patient Details Name: [name] [name] MRN: [id number] DOB: 11-09-1930   Cancelled Treatment:     Pt up in room washing up and has already taken a lap around the floor with nursing.  He says he is doing fine and does not need PT.  Will sign off  Donato Heinz. Owens Shark, PT  Norwood Levo 01/14/2018, 2:56 PM

## 2018-01-14 NOTE — Progress Notes (Addendum)
4 Days Post-Op    CC:  Generalized acute abdominal pain.  Subjective: He looks great and has been up some, tolerating ice chips.  Wants to eat and shower,get IV out.  + flatus, no nausea   Objective: Vital signs in last 24 hours: Temp:  [97.6 F (36.4 C)-97.9 F (36.6 C)] 97.9 F (36.6 C) (02/25 0429) Pulse Rate:  [60-63] 61 (02/25 0429) Resp:  [12-17] 12 (02/25 0429) BP: (130-174)/(63-64) 136/64 (02/25 0429) SpO2:  [95 %-98 %] 96 % (02/25 0429) Weight:  [52.2 kg (115 lb)] 52.2 kg (115 lb) (02/24 1317) Last BM Date: 01/10/18 200 IV recorded 1625 IV Stool x 0 sofar Afebrile, VSS No labs this AM No films this AM  Intake/Output from previous day: 02/24 0701 - 02/25 0700 In: 200 [I.V.:200] Out: 1625 [Urine:1625] Intake/Output this shift: No intake/output data recorded.  General appearance: alert, cooperative and no distress Resp: clear to auscultation bilaterally GI: soft, few BS, wound OK, Waffle dressing in place.  Lab Results:  Recent Labs    01/12/18 0336  WBC 12.2*  HGB 10.5*  HCT 32.7*  PLT 150    BMET Recent Labs    01/12/18 0336  NA 140  K 4.3  CL 108  CO2 22  GLUCOSE 118*  BUN 22*  CREATININE 1.35*  CALCIUM 7.9*   PT/INR No results for input(s): LABPROT, INR in the last 72 hours.  Recent Labs  Lab 01/10/18 1637  AST 35  ALT 28  ALKPHOS 115  BILITOT 0.3  PROT 8.0  ALBUMIN 3.6     Lipase     Component Value Date/Time   LIPASE 28 01/10/2018 1637     Medications: . chlorhexidine  15 mL Mouth Rinse BID  . heparin  5,000 Units Subcutaneous Q8H  . mouth rinse  15 mL Mouth Rinse q12n4p  . pantoprazole (PROTONIX) IV  40 mg Intravenous QHS   . dextrose 5 % and 0.9 % NaCl with KCl 20 mEq/L 100 mL/hr at 01/14/18 0557  . methocarbamol (ROBAXIN)  IV Stopped (01/12/18 2225)   Anti-infectives (From admission, onward)   Start     Dose/Rate Route Frequency Ordered Stop   01/10/18 1745  piperacillin-tazobactam (ZOSYN) IVPB 3.375 g     3.375 g 100 mL/hr over 30 Minutes Intravenous  Once 01/10/18 1741 01/10/18 1839      Assessment/Plan Coronary artery disease with prior LAD stent Hx of systolic CHF (EF 94-17%, grade I diastolic dysfunction, Mild to Mod AR) Chronic kidney disease stage III  - creatinine 1.16 admit =>> 1.35 today COPD/Emphysema  Chronic myelocytic leukemia - oral chemotherapy daily Carotid artery stenosis with prior carotid endarterectomy Prior small bowel obstruction reduction of volvulus 11/09/2017 Dr. Donne Hazel S/P exploratory laparotomy for small bowel obstruction 10/14/2017 Dr. Excell Seltzer   Midgut volvulus Exploratory laparotomy, lysis of adhesions, decompression of midgut volvulus, 01/10/18 Dr. Autumn Messing III POD 4 Leukocytosis - WBC 18.5 POD1/CML  FEN: IV fluids/n.p.o. ID: Preop Zosyn DVT: Heparin Foley: out but has not voided yet Follow-up: Dr. Marlou Starks  LOS: 1 day   Plan:  Start some clears,  Mobilize, continue IV fluids for now and recheck labs in AM.     LOS: 4 days    JENNINGS,WILLARD 01/14/2018 [phone]  Agree with above. He looks very good today.  Progressing rapidly.  Alphonsa Overall, MD, [facility] Pager: [phone] Office phone:  [phone]

## 2018-01-15 LAB — COMPREHENSIVE METABOLIC PANEL
ALT: 25 U/L (ref 17–63)
AST: 24 U/L (ref 15–41)
Albumin: 2.9 g/dL — ABNORMAL LOW (ref 3.5–5.0)
Alkaline Phosphatase: 73 U/L (ref 38–126)
Anion gap: 7 (ref 5–15)
BUN: 8 mg/dL (ref 6–20)
CO2: 19 mmol/L — ABNORMAL LOW (ref 22–32)
Calcium: 7.8 mg/dL — ABNORMAL LOW (ref 8.9–10.3)
Chloride: 113 mmol/L — ABNORMAL HIGH (ref 101–111)
Creatinine, Ser: 1.06 mg/dL (ref 0.61–1.24)
GFR calc Af Amer: >60 mL/min (ref >60)
GFR calc non Af Amer: >60 mL/min (ref >60)
Glucose, Bld: 99 mg/dL (ref 65–99)
Potassium: 4.1 mmol/L (ref 3.5–5.1)
Sodium: 139 mmol/L (ref 135–145)
Total Bilirubin: 0.6 mg/dL (ref 0.3–1.2)
Total Protein: 6 g/dL — ABNORMAL LOW (ref 6.5–8.1)

## 2018-01-15 LAB — CBC
HCT: 32 % — ABNORMAL LOW (ref 39.0–52.0)
Hemoglobin: 10.2 g/dL — ABNORMAL LOW (ref 13.0–17.0)
MCH: 29 pg (ref 26.0–34.0)
MCHC: 31.9 g/dL (ref 30.0–36.0)
MCV: 90.9 fL (ref 78.0–100.0)
Platelets: 154 10*3/uL (ref 150–400)
RBC: 3.52 MIL/uL — ABNORMAL LOW (ref 4.22–5.81)
RDW: 17 % — ABNORMAL HIGH (ref 11.5–15.5)
WBC: 6.1 10*3/uL (ref 4.0–10.5)

## 2018-01-15 LAB — PREALBUMIN: Prealbumin: 15.3 mg/dL — ABNORMAL LOW (ref 18–38)

## 2018-01-15 MED ORDER — BOSUTINIB 100 MG PO TABS
400.0000 mg | ORAL_TABLET | Freq: Every day | ORAL | Status: DC
  Filled 2018-01-15: qty 4

## 2018-01-15 MED ORDER — CARVEDILOL 6.25 MG PO TABS
6.2500 mg | ORAL_TABLET | Freq: Two times a day (BID) | ORAL | Status: DC
  Administered 2018-01-15 – 2018-01-16 (×3): 6.25 mg via ORAL
  Filled 2018-01-15 (×3): qty 1

## 2018-01-15 MED ORDER — ACETAMINOPHEN 325 MG PO TABS: 650.0000 mg | ORAL_TABLET | ORAL | Status: DC | PRN

## 2018-01-15 MED ORDER — CARVEDILOL 6.25 MG PO TABS: 6.2500 mg | ORAL_TABLET | Freq: Two times a day (BID) | ORAL | Status: DC

## 2018-01-15 MED ORDER — ACETAMINOPHEN-CODEINE #3 300-30 MG PO TABS: 1.0000 | ORAL_TABLET | ORAL | Status: DC | PRN

## 2018-01-15 MED ORDER — LOSARTAN POTASSIUM 25 MG PO TABS: ORAL_TABLET | ORAL | 3 refills | Status: DC

## 2018-01-15 MED ORDER — ASPIRIN EC 81 MG PO TBEC
81.0000 mg | DELAYED_RELEASE_TABLET | Freq: Every day | ORAL | Status: DC
  Administered 2018-01-15 – 2018-01-16 (×2): 81 mg via ORAL
  Filled 2018-01-15 (×2): qty 1

## 2018-01-15 NOTE — Progress Notes (Addendum)
5 Days Post-Op    RX:[id number] acute abdominal pain.  Subjective: He is doing pretty well.  He lives alone, he is primary caretaker for his wife and has had to put in SNF for now.  Children are in [place], [place], they will look in on him but he is pretty much on his own.  Objective: Vital signs in last 24 hours: Temp:  [98 F (36.7 C)-98.4 F (36.9 C)] 98.4 F (36.9 C) (02/26 0357) Pulse Rate:  [64-69] 69 (02/26 0357) Resp:  [10-12] 12 (02/26 0357) BP: (142-160)/(72-78) 142/72 (02/26 0357) SpO2:  [94 %-100 %] 94 % (02/26 0357) Last BM Date: 01/14/18 480 PO  1600 urine BM x 2 Afebrile, VSS Labs OK WBC down to 6.1  Yesterday  Prealbumin 15.3 Intake/Output from previous day: 02/25 0701 - 02/26 0700 In: 480 [P.O.:480] Out: 1600 [Urine:1600] Intake/Output this shift: No intake/output data recorded.  General appearance: alert, cooperative and no distress Resp: clear to auscultation bilaterally and somewhat decreased at the bases GI: soft sore, incision looks fine.    Lab Results:  Recent Labs    01/15/18 0403  WBC 6.1  HGB 10.2*  HCT 32.0*  PLT 154    BMET Recent Labs    01/15/18 0403  NA 139  K 4.1  CL 113*  CO2 19*  GLUCOSE 99  BUN 8  CREATININE 1.06  CALCIUM 7.8*   PT/INR No results for input(s): LABPROT, INR in the last 72 hours.  Recent Labs  Lab 01/10/18 1637 01/15/18 0403  AST 35 24  ALT 28 25  ALKPHOS 115 73  BILITOT 0.3 0.6  PROT 8.0 6.0*  ALBUMIN 3.6 2.9*     Lipase     Component Value Date/Time   LIPASE 28 01/10/2018 1637     Medications: . chlorhexidine  15 mL Mouth Rinse BID  . heparin  5,000 Units Subcutaneous Q8H  . mouth rinse  15 mL Mouth Rinse q12n4p  . pantoprazole (PROTONIX) IV  40 mg Intravenous QHS    Assessment/Plan Coronary artery disease with prior LAD stent Hx of systolic CHF (EF 09-32%, grade I diastolic dysfunction, Mild to Mod AR) Chronic kidney disease stage III- creatinine 1.16  admit =>> 1.35 today COPD/Emphysema Chronic myelocytic leukemia- oral chemotherapy daily Carotid artery stenosis with prior carotid endarterectomy Prior small bowel obstruction reduction of volvulus 11/09/2017 Dr. Donne Hazel S/P exploratory laparotomy for small bowel obstruction 10/14/2017 Dr. Excell Seltzer   Midgut volvulus Exploratory laparotomy, lysis of adhesions, decompression of midgut volvulus, 01/10/18 Dr. Eddie Dibbles TothIIIPOD 5 Leukocytosis - WBC 18.5 POD1/CML =>> down to normal now  FEN:IV fluids/n.p.o. IZ:TIWPY Zosyn DVT: Heparin Foley:out  Follow-up:Dr. Marlou Starks   Plan:  I put him on Fulls this AM, and he did well advance diet, stop IV fluids.    Give him a day here on soft diet. I am restarting Coreg, and Bosulif.  He is also on an ARB, but I will not start that yet.    Adding PO pain meds.  If he does well home tomorrow.       LOS: 5 days   JENNINGS,WILLARD 01/15/2018 [phone]  Agree with above. Looks good.  Progressing well.  Alphonsa Overall, MD, [facility] Pager: [phone] Office phone:  [phone]

## 2018-01-16 MED ORDER — ACETAMINOPHEN-CODEINE #3 300-30 MG PO TABS: 1.0000 | ORAL_TABLET | ORAL | 0 refills | Status: DC | PRN

## 2018-01-16 NOTE — Progress Notes (Signed)
[name] [name] to be D/C'd Home per MD order. Discussed with the patient and all questions fully answered.    IV catheter discontinued intact. Site without signs and symptoms of complications. Dressing and pressure applied.  An After Visit Summary was printed and given to the patient.  Patient escorted via [place], and D/C home via private auto.  Cyndra Numbers  01/16/2018 10:45 AM

## 2018-01-16 NOTE — Discharge Instructions (Signed)
[facility], [place] [phone]  OPEN ABDOMINAL SURGERY: POST OP INSTRUCTIONS  Always review your discharge instruction sheet given to you by the facility where your surgery was performed.  IF YOU HAVE DISABILITY OR FAMILY LEAVE FORMS, YOU MUST BRING THEM TO THE OFFICE FOR PROCESSING.  PLEASE DO NOT GIVE THEM TO YOUR DOCTOR. If that incision bleeds again, or gets red call and be seen in the office before you are currently scheduled.   1. A prescription for pain medication may be given to you upon discharge.  Take your pain medication as prescribed, if needed.  If narcotic pain medicine is not needed, then you may take acetaminophen (Tylenol) or ibuprofen (Advil) as needed. 2. Take your usually prescribed medications unless otherwise directed. 3. If you need a refill on your pain medication, please contact your pharmacy. They will contact our office to request authorization.  Prescriptions will not be filled after 5pm or on week-ends. 4. You should follow a light diet the first few days after arrival home, such as soup and crackers, pudding, etc.unless your doctor has advised otherwise. A high-fiber, low fat diet can be resumed as tolerated.   Be sure to include lots of fluids daily. Most patients will experience some swelling and bruising on the chest and neck area.  Ice packs will help.  Swelling and bruising can take several days to resolve 5. Most patients will experience some swelling and bruising in the area of the incision. Ice pack will help. Swelling and bruising can take several days to resolve..  6. It is common to experience some constipation if taking pain medication after surgery.  Increasing fluid intake and taking a stool softener will usually help or prevent this problem from occurring.  A mild laxative (Milk of Magnesia or Miralax) should be taken according to package directions if there are no bowel movements after 48 hours. 7.  You may have steri-strips (small  skin tapes) in place directly over the incision.  These strips should be left on the skin for 7-10 days.  If your surgeon used skin glue on the incision, you may shower in 24 hours.  The glue will flake off over the next 2-3 weeks.  Any sutures or staples will be removed at the office during your follow-up visit. You may find that a light gauze bandage over your incision may keep your staples from being rubbed or pulled. You may shower and replace the bandage daily. 8. ACTIVITIES:  You may resume regular (light) daily activities beginning the next day--such as daily self-care, walking, climbing stairs--gradually increasing activities as tolerated.  You may have sexual intercourse when it is comfortable.  Refrain from any heavy lifting or straining until approved by your doctor. a. You may drive when you no longer are taking prescription pain medication, you can comfortably wear a seatbelt, and you can safely maneuver your car and apply brakes b. Return to Work: ___________________________________ 47. You should see your doctor in the office for a follow-up appointment approximately two weeks after your surgery.  Make sure that you call for this appointment within a day or two after you arrive home to insure a convenient appointment time. OTHER INSTRUCTIONS:  _____________________________________________________________ _____________________________________________________________  WHEN TO CALL YOUR DOCTOR: 1. Fever over 101.0 2. Inability to urinate 3. Nausea and/or vomiting 4. Extreme swelling or bruising 5. Continued bleeding from incision. 6. Increased pain, redness, or drainage from the incision. 7. Difficulty swallowing or breathing 8. Muscle  cramping or spasms. 9. Numbness or tingling in hands or feet or around lips.  The clinic staff is available to answer your questions during regular business hours.  Please dont hesitate to call and ask to speak to one of the nurses if you have  concerns.  For further questions, please visit [email or web address] High-Protein and High-Calorie Diet Eating high-protein and high-calorie foods can help you to gain weight, heal after an injury, and recover after an illness or surgery. What is my plan? The specific amount of daily protein and calories you need depends on:  Your body weight.  The reason this diet is recommended for you.  Generally, a high-protein, high-calorie diet involves:  Eating 250-500 extra calories each day.  Making sure that 10-35% of your daily calories come from protein.  Talk to your health care provider about how much protein and how many calories you need each day. Follow the diet as directed by your health care provider. What do I need to know about this diet?  Ask your health care provider if you should take a nutritional supplement.  Try to eat six small meals each day instead of three large meals.  Eat a balanced diet, including one food that is high in protein at each meal.  Keep nutritious snacks handy, such as nuts, trail mixes, dried fruit, and yogurt.  If you have kidney disease or diabetes, eating too much protein may put extra stress on your kidneys. Talk to your health care provider if you have either of those conditions. What are some high-protein foods? Grains Quinoa. Bulgur wheat. Vegetables Soybeans. Peas. Meats and Other Protein Sources Beef, pork, and poultry. Fish and seafood. Eggs. Tofu. Textured vegetable protein (TVP). Peanut butter. Nuts and seeds. Dried beans. Protein powders. Dairy Whole milk. Whole-milk yogurt. Powdered milk. Cheese. Yahoo. Eggnog. Beverages High-protein supplement drinks. Soy milk. Other Protein bars. The items listed above may not be a complete list of recommended foods or beverages. Contact your dietitian for more options. What are some high-calorie foods? Grains Pasta. Quick breads. Muffins. Pancakes. Ready-to-eat  cereal. Vegetables Vegetables cooked in oil or butter. Fried potatoes. Fruits Dried fruit. Fruit leather. Canned fruit in syrup. Fruit juice. Avocados. Meats and Other Protein Sources Peanut butter. Nuts and seeds. Dairy Heavy cream. Whipped cream. Cream cheese. Sour cream. Ice cream. Custard. Pudding. Beverages Meal-replacement beverages. Nutrition shakes. Fruit juice. Sugar-sweetened soft drinks. Condiments Salad dressing. Mayonnaise. Alfredo sauce. Fruit preserves or jelly. Honey. Syrup. Sweets/Desserts Cake. Cookies. Pie. Pastries. Candy bars. Chocolate. Fats and Oils Butter or margarine. Oil. Gravy. Other Meal-replacement bars. The items listed above may not be a complete list of recommended foods or beverages. Contact your dietitian for more options. What are some tips for including high-protein and high-calorie foods in my diet?  Add whole milk, half-and-half, or heavy cream to cereal, pudding, soup, or hot cocoa.  Add whole milk to instant breakfast drinks.  Add peanut butter to oatmeal or smoothies.  Add powdered milk to baked goods, smoothies, or milkshakes.  Add powdered milk, cream, or butter to mashed potatoes.  Add cheese to cooked vegetables.  Make whole-milk yogurt parfaits. Top them with granola, fruit, or nuts.  Add cottage cheese to your fruit.  Add avocados, cheese, or both to sandwiches or salads.  Add meat, poultry, or seafood to rice, pasta, casseroles, salads, and soups.  Use mayonnaise when making egg salad, chicken salad, or tuna salad.  Use peanut butter as a topping for pretzels, celery, or crackers.  Add beans to casseroles, dips, and spreads.  Add pureed beans to sauces and soups.  Replace calorie-free drinks with calorie-containing drinks, such as milk and fruit juice. This information is not intended to replace advice given to you by your health care provider. Make sure you discuss any questions you have with your health care  provider. Document Released: 11/06/2005 Document Revised: 04/13/2016 Document Reviewed: 04/21/2014 Elsevier Interactive Patient Education  2018 [place].  Blood pressure and heart rate:  Take your blood pressure and heart rate 2-3 times per day at home.  Record and take with you to Dr. Sallyanne Havers.   Call Dr. Sallyanne Havers, Meda Coffee, MD if your heart rate is less than 60 or greater than 90. Call for systolic blood pressure(top number) less than 100 or greater than 150.  Call for a diastolic blood pressure (lower number) less than 60 or greater than 88. Call if you feel dizzy standing at home also.

## 2018-01-16 NOTE — Progress Notes (Signed)
Pt. Called nurse to report new drainage from wound/incision. This nurse noted minimal amount of sanguineous drainage. Incision cleaned ABD dressing applied.

## 2018-01-16 NOTE — Progress Notes (Addendum)
6 Days Post-Op    CC:  acute abdominal pain.  Subjective: He looks really good this a.m.  He is dressed and ready to go.  He had an abdominal dressing on this morning and said it started leaking while he was in the shower.  He has a hematoma that I think I have drained as well as possible.  The area between the sutures is solid there is no erythema and I think it will be okay.   He is tolerating the Coreg well and understands the plan to check his blood pressures at home, and then call his family physician on Friday and be sure it is okay to restart his losartan.   He is tolerating his diet well, ambulating, and feels he is ready for discharge.  Objective: Vital signs in last 24 hours: Temp:  [98 F (36.7 C)-98.9 F (37.2 C)] 98.1 F (36.7 C) (02/27 0524) Pulse Rate:  [60-68] 60 (02/27 0524) Resp:  [14-16] 16 (02/27 0524) BP: (143-175)/(76-80) 143/76 (02/27 0524) SpO2:  [96 %-100 %] 97 % (02/27 0524) Last BM Date: 01/15/18 840 p.o. Voided x4 BM x1 Afebrile, vital signs are stable.  Blood pressure is good on Coreg.  He got 2 doses yesterday and 1 this AM.  He is not taking anything for pain.  Intake/Output from previous day: 02/26 0701 - 02/27 0700 In: 840 [P.O.:840] Out: -  Intake/Output this shift: No intake/output data recorded.  General appearance: alert, cooperative and no distress Resp: clear to auscultation bilaterally GI: Soft, sore, had some drainage from the midportion of the abdominal wound worked on it and it just seems to be like a hematoma that I pretty much expressed.  There is no space between the staples in place.  There is no erythema.  He is tolerating the diet and having bowel movements.  Lab Results:  Recent Labs    01/15/18 0403  WBC 6.1  HGB 10.2*  HCT 32.0*  PLT 154    BMET Recent Labs    01/15/18 0403  NA 139  K 4.1  CL 113*  CO2 19*  GLUCOSE 99  BUN 8  CREATININE 1.06  CALCIUM 7.8*   PT/INR No results for input(s): LABPROT, INR in  the last 72 hours.  Recent Labs  Lab 01/10/18 1637 01/15/18 0403  AST 35 24  ALT 28 25  ALKPHOS 115 73  BILITOT 0.3 0.6  PROT 8.0 6.0*  ALBUMIN 3.6 2.9*     Lipase     Component Value Date/Time   LIPASE 28 01/10/2018 1637     Medications: . aspirin EC  81 mg Oral Daily  . bosutinib  400 mg Oral Q breakfast  . carvedilol  6.25 mg Oral BID WC  . chlorhexidine  15 mL Mouth Rinse BID  . heparin  5,000 Units Subcutaneous Q8H  . mouth rinse  15 mL Mouth Rinse q12n4p    Assessment/Plan Coronary artery disease with prior LAD stent Hx of systolic CHF (EF 84-13%, grade I diastolic dysfunction, Mild to Mod AR) Chronic kidney disease stage III- creatinine 1.16 admit =>> 1.35 today COPD/Emphysema Chronic myelocytic leukemia- oral   Bosulif daily Carotid artery stenosis with prior carotid endarterectomy Prior small bowel obstruction reduction of volvulus 11/09/2017 Dr. Donne Hazel S/P exploratory laparotomy for small bowel obstruction 10/14/2017 Dr. Excell Seltzer Hypertension - Coreg 6.25 BID  Midgut volvulus Exploratory laparotomy, lysis of adhesions, decompression of midgut volvulus, 01/10/18 Dr. Eddie Dibbles TothIIIPOD6 Leukocytosis - WBC 18.5 POD1/CML =>> down to normal now  FEN:IV fluids/n.p.o. LY:YTKPT Zosyn DVT: Heparin Foley:out  Follow-up:Dr. Marlou Starks   Plan: We will discharge him home today.  For his blood pressure we have resumed the Coreg. He has follow-up appointments next week to see 1 of our nurses to remove his staples.    He will see our PA the following week and on the third week he will see Dr. Marlou Starks.  He knows to call if he has problems with further drainage or erythema around the incision.  He seems quite confident about going home and anxious to go.    He will check his blood pressures at home and call his PCP about resuming his losartan on Friday.    LOS: 6 days    [name] [name] 01/16/2018 [phone]  Agree with above. He is ready to go  home.  Alphonsa Overall, MD, [facility] Surgery Pager: [phone] Office phone:  [phone]

## 2018-01-21 NOTE — Discharge Summary (Addendum)
Physician Discharge Summary  [name] MRN: [id number] DOB/AGE: 1930/04/20 84 y.o.  Admit date: 01/10/2018 Discharge date: 01/16/2018  Admission Diagnoses:  Midgut volvulus Coronary artery disease with prior LAD stent Hx of systolic CHF (EF 74-25%, grade I diastolic dysfunction, Mild to Mod AR) Chronic kidney disease stage III- creatinine 1.16 admit =>>1.35 today COPD/Emphysema Chronic myelocytic leukemia- oral  Bosulif daily Carotid artery stenosis with prior carotid endarterectomy Prior small bowel obstruction reduction of volvulus 11/09/2017 Dr. Donne Hazel S/P exploratory laparotomy for small bowel obstruction 10/14/2017 Dr. Excell Seltzer Hypertension - Coreg 6.25 BID     Discharge Diagnoses:   Same   Active Problems:   Midgut volvulus   Protein-calorie malnutrition, severe   PROCEDURES: Exploratory laparotomy, lysis of adhesions, decompression of midgut volvulus, 01/10/18 Dr. Eddie Dibbles [facility] Course:  The patient is an 84 year old white male who presents with acute abdominal pain that started earlier today.  The pain has been associated with nausea and vomiting.  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  His CT scan done today looks exactly the same as it did on the previous 2 admissions consistent with a midgut volvulus. Pt was seen and admitted by Dr. Marlou Starks and taken to the OR that evening.    Post op he really didn't have any significant problems.  His diet was slowly advanced and he was mobilized.  He had a waffle dressing and that was removed day prior to discharge.  He had a hematoma from the mid line the following AM.  That was expressed and the wound was clean.  He was tolerating diet, bowel function was returning to normal and he was mobilizing well.  He has follow up in the office for staple removal next week,  the office for wound check the following week and then back to see Dr. Marlou Starks.  CBC Latest Ref Rng & Units 01/15/2018 01/12/2018 01/11/2018  WBC 4.0 - 10.5 K/uL 6.1 12.2(H) 18.5(H)  Hemoglobin 13.0 - 17.0 g/dL 10.2(L) 10.5(L) 11.8(L)  Hematocrit 39.0 - 52.0 % 32.0(L) 32.7(L) 37.8(L)  Platelets 150 - 400 K/uL 154 150 178   CMP Latest Ref Rng & Units 01/15/2018 01/12/2018 01/11/2018  Glucose 65 - 99 mg/dL 99 118(H) 170(H)  BUN 6 - 20 mg/dL 8 22(H) 18  Creatinine 0.61 - 1.24 mg/dL 1.06 1.35(H) 1.35(H)  Sodium 135 - 145 mmol/L 139 140 138  Potassium 3.5 - 5.1 mmol/L 4.1 4.3 3.9  Chloride 101 - 111 mmol/L 113(H) 108 106  CO2 22 - 32 mmol/L 19(L) 22 22  Calcium 8.9 - 10.3 mg/dL 7.8(L) 7.9(L) 8.1(L)  Total Protein 6.5 - 8.1 g/dL 6.0(L) - -  Total Bilirubin 0.3 - 1.2 mg/dL 0.6 - -  Alkaline Phos 38 - 126 U/L 73 - -  AST 15 - 41 U/L 24 - -  ALT 17 - 63 U/L 25 - -    Condition on DC:  Improved   Disposition: 01-Home or Self Care   Allergies as of 01/16/2018      Reactions   Percocet [oxycodone-acetaminophen] Itching   Metronidazole Other (See Comments)   Insomnia and nervousness      Medication List    STOP taking these medications   [facility] VITAMIN A PO   HYDROcodone-acetaminophen 5-325 MG tablet Commonly known as:  NORCO/VICODIN   naproxen sodium  220 MG tablet Commonly known as:  ALEVE     TAKE these medications   acetaminophen 325 MG tablet Commonly known as:  TYLENOL You can take plain Tylenol 2 tablets every 4 hours for mild pain.  You can take the prescribed pain medicine for pain not relieved by plain Tylenol.  DO NOT TAKE MORE THAN 4000 MG OF TYLENOL PER DAY.  IT CAN HARM YOUR LIVER.  TYLENOL (ACETAMINOPHEN) IS ALSO IN YOUR PRESCRIPTION PAIN MEDICATION.  YOU HAVE TO COUNT IT IN YOUR DAILY TOTAL.   acetaminophen-codeine 300-30 MG tablet Commonly known as:  TYLENOL #3 Take 1-2 tablets by mouth every 4 (four) hours as needed for severe pain (pain not relieved by plain  Tylenol).   aspirin EC 81 MG tablet Take 1 tablet (81 mg total) by mouth daily.   BOSULIF 100 MG tablet Generic drug:  bosutinib Call Dr. [name], make an appointment and discuss when to resume this medicine. What changed:    how much to take  how to take this  when to take this  additional instructions   calcium carbonate 600 MG Tabs tablet Commonly known as:  CALCIUM 600 Resume this as before next week when you are eating better. What changed:    how much to take  how to take this  when to take this  additional instructions   carvedilol 12.5 MG tablet Commonly known as:  COREG TAKE 1/2 TABLET TWICE DAILY WITH A MEAL   cyanocobalamin 1000 MCG tablet Take 1,000 mcg by mouth daily.   losartan 25 MG tablet Commonly known as:  COZAAR Do not resume this until you have instruction from Dr. Sallyanne Havers.  Call for follow up in 2 weeks. Record your blood pressure and heart rate at home and take with you to see Dr. Sallyanne Havers. What changed:    how much to take  how to take this  when to take this  additional instructions   VITAMIN B1 PO Take 250 mg by mouth daily.   VITAMIN C PO Take 1,000 mg by mouth daily.      Follow-up Information    [facility] Follow up on 01/23/2018.   Specialty:  General Surgery Why:  Appointment # 1: staple removal at 2 PM with nursing staff 01/23/18.    Appointment # 2:  You will see our PA on 01/29/18 at 1:45.   Contact information: [address] [place] [address] [phone]        Drake Leach, MD Follow up.   Specialty:  Internal Medicine Why:  call and follow up in 2-3 weeks.  Take your blood pressure at home 2-3 times per day and let him see what you are doing. Call and see sooner if your blood pressure is abdnormal or your heart rate is to fast or slow.  See instructions below.   Contact information: [address] [place] [address] [phone]        Christian Mate., MD Follow up.    Specialty:  Hematology and Oncology Why:  call for follow up appointment, let him know you were off chemotherapy for this surgery for a short time.   Contact information: [address] [place] [address] [phone]        Autumn Messing III, MD Follow up on 02/12/2018.   Specialty:  General Surgery Why:  Your appointment is at 9:10 AM.  Be at the office 30 minutes early for check in.   Contact  information: [address] [place] [address] [phone]           Signed: Earnstine Regal 01/21/2018, 1:57 PM   He has done very well from the surgery. Agree with above.  Alphonsa Overall, MD, [facility] Surgery Pager: [phone] Office phone:  [phone]

## 2018-02-15 ENCOUNTER — Other Ambulatory Visit: Payer: Self-pay | Admitting: Cardiology

## 2018-02-28 ENCOUNTER — Observation Stay (HOSPITAL_COMMUNITY)
Admission: EM | Admit: 2018-02-28 | Discharge: 2018-03-01 | Disposition: A | Discharge status: Home / Self Care | Payer: Medicare HMO | Attending: Internal Medicine | Admitting: Internal Medicine

## 2018-02-28 ENCOUNTER — Encounter (HOSPITAL_COMMUNITY): Payer: Self-pay

## 2018-02-28 ENCOUNTER — Inpatient Hospital Stay (HOSPITAL_COMMUNITY): Payer: Medicare HMO

## 2018-02-28 ENCOUNTER — Other Ambulatory Visit: Payer: Self-pay

## 2018-02-28 ENCOUNTER — Emergency Department (HOSPITAL_COMMUNITY): Payer: Medicare HMO

## 2018-02-28 DIAGNOSIS — Z8249 Family history of ischemic heart disease and other diseases of the circulatory system: Secondary | ICD-10-CM | POA: Diagnosis not present

## 2018-02-28 DIAGNOSIS — I6529 Occlusion and stenosis of unspecified carotid artery: Secondary | ICD-10-CM | POA: Insufficient documentation

## 2018-02-28 DIAGNOSIS — I2510 Atherosclerotic heart disease of native coronary artery without angina pectoris: Secondary | ICD-10-CM | POA: Diagnosis not present

## 2018-02-28 DIAGNOSIS — Z7982 Long term (current) use of aspirin: Secondary | ICD-10-CM | POA: Insufficient documentation

## 2018-02-28 DIAGNOSIS — I255 Ischemic cardiomyopathy: Secondary | ICD-10-CM | POA: Insufficient documentation

## 2018-02-28 DIAGNOSIS — I509 Heart failure, unspecified: Secondary | ICD-10-CM | POA: Diagnosis not present

## 2018-02-28 DIAGNOSIS — I5022 Chronic systolic (congestive) heart failure: Secondary | ICD-10-CM | POA: Insufficient documentation

## 2018-02-28 DIAGNOSIS — Z87891 Personal history of nicotine dependence: Secondary | ICD-10-CM | POA: Diagnosis not present

## 2018-02-28 DIAGNOSIS — Z955 Presence of coronary angioplasty implant and graft: Secondary | ICD-10-CM | POA: Diagnosis not present

## 2018-02-28 DIAGNOSIS — N183 Chronic kidney disease, stage 3 (moderate): Secondary | ICD-10-CM | POA: Diagnosis not present

## 2018-02-28 DIAGNOSIS — R790 Abnormal level of blood mineral: Secondary | ICD-10-CM | POA: Diagnosis not present

## 2018-02-28 DIAGNOSIS — E785 Hyperlipidemia, unspecified: Secondary | ICD-10-CM | POA: Diagnosis not present

## 2018-02-28 DIAGNOSIS — J9601 Acute respiratory failure with hypoxia: Secondary | ICD-10-CM | POA: Diagnosis not present

## 2018-02-28 LAB — TROPONIN I
Troponin I: 0.1 ng/mL (ref <0.03)
Troponin I: 0.09 ng/mL (ref <0.03)

## 2018-02-28 LAB — CBC WITH DIFFERENTIAL/PLATELET
Basophils Absolute: 0 10*3/uL (ref 0.0–0.1)
Basophils Relative: 0 %
Eosinophils Absolute: 0.4 10*3/uL (ref 0.0–0.7)
Eosinophils Relative: 3 %
HCT: 37.2 % — ABNORMAL LOW (ref 39.0–52.0)
Hemoglobin: 11.6 g/dL — ABNORMAL LOW (ref 13.0–17.0)
Lymphocytes Relative: 16 %
Lymphs Abs: 1.9 10*3/uL (ref 0.7–4.0)
MCH: 28.2 pg (ref 26.0–34.0)
MCHC: 31.2 g/dL (ref 30.0–36.0)
MCV: 90.3 fL (ref 78.0–100.0)
Monocytes Absolute: 0.9 10*3/uL (ref 0.1–1.0)
Monocytes Relative: 8 %
Neutro Abs: 8.8 10*3/uL — ABNORMAL HIGH (ref 1.7–7.7)
Neutrophils Relative %: 73 %
Platelets: 182 10*3/uL (ref 150–400)
RBC: 4.12 MIL/uL — ABNORMAL LOW (ref 4.22–5.81)
RDW: 16.7 % — ABNORMAL HIGH (ref 11.5–15.5)
WBC: 11.9 10*3/uL — ABNORMAL HIGH (ref 4.0–10.5)

## 2018-02-28 LAB — ECHOCARDIOGRAM COMPLETE
Height: 69 in
Weight: 1920 oz

## 2018-02-28 LAB — CBC
HCT: 38.8 % — ABNORMAL LOW (ref 39.0–52.0)
Hemoglobin: 12.4 g/dL — ABNORMAL LOW (ref 13.0–17.0)
MCH: 28.2 pg (ref 26.0–34.0)
MCHC: 32 g/dL (ref 30.0–36.0)
MCV: 88.4 fL (ref 78.0–100.0)
Platelets: 180 10*3/uL (ref 150–400)
RBC: 4.39 MIL/uL (ref 4.22–5.81)
RDW: 16.5 % — ABNORMAL HIGH (ref 11.5–15.5)
WBC: 13 10*3/uL — ABNORMAL HIGH (ref 4.0–10.5)

## 2018-02-28 LAB — I-STAT ARTERIAL BLOOD GAS, ED
Acid-base deficit: 3 mmol/L — ABNORMAL HIGH (ref 0.0–2.0)
Bicarbonate: 22 mmol/L (ref 20.0–28.0)
O2 Saturation: 100 %
Patient temperature: 98.6
TCO2: 23 mmol/L (ref 22–32)
pCO2 arterial: 39.1 mmHg (ref 32.0–48.0)
pH, Arterial: 7.359 (ref 7.350–7.450)
pO2, Arterial: 275 mmHg — ABNORMAL HIGH (ref 83.0–108.0)

## 2018-02-28 LAB — MAGNESIUM: Magnesium: 1.6 mg/dL — ABNORMAL LOW (ref 1.7–2.4)

## 2018-02-28 LAB — CREATININE, SERUM
Creatinine, Ser: 1.21 mg/dL (ref 0.61–1.24)
GFR calc Af Amer: >60 mL/min (ref >60)
GFR calc non Af Amer: 53 mL/min — ABNORMAL LOW (ref >60)

## 2018-02-28 LAB — COMPREHENSIVE METABOLIC PANEL
ALT: 29 U/L (ref 17–63)
AST: 38 U/L (ref 15–41)
Albumin: 3.2 g/dL — ABNORMAL LOW (ref 3.5–5.0)
Alkaline Phosphatase: 113 U/L (ref 38–126)
Anion gap: 11 (ref 5–15)
BUN: 14 mg/dL (ref 6–20)
CO2: 23 mmol/L (ref 22–32)
Calcium: 7.8 mg/dL — ABNORMAL LOW (ref 8.9–10.3)
Chloride: 106 mmol/L (ref 101–111)
Creatinine, Ser: 1.24 mg/dL (ref 0.61–1.24)
GFR calc Af Amer: 59 mL/min — ABNORMAL LOW (ref >60)
GFR calc non Af Amer: 51 mL/min — ABNORMAL LOW (ref >60)
Glucose, Bld: 139 mg/dL — ABNORMAL HIGH (ref 65–99)
Potassium: 3.2 mmol/L — ABNORMAL LOW (ref 3.5–5.1)
Sodium: 140 mmol/L (ref 135–145)
Total Bilirubin: 0.4 mg/dL (ref 0.3–1.2)
Total Protein: 6.5 g/dL (ref 6.5–8.1)

## 2018-02-28 LAB — MRSA PCR SCREENING: MRSA by PCR: NEGATIVE

## 2018-02-28 LAB — BRAIN NATRIURETIC PEPTIDE: B Natriuretic Peptide: 801 pg/mL — ABNORMAL HIGH (ref 0.0–100.0)

## 2018-02-28 LAB — I-STAT TROPONIN, ED: Troponin i, poc: 0.03 ng/mL (ref 0.00–0.08)

## 2018-02-28 IMAGING — DX DG CHEST 1V PORT
1 series · 1 of 1 positions shown · non-contrast
Comparison: Radiograph [DATE]

CLINICAL DATA: Increased shortness of breath.  Productive cough.

EXAM:
PORTABLE CHEST 1 VIEW

[chest]
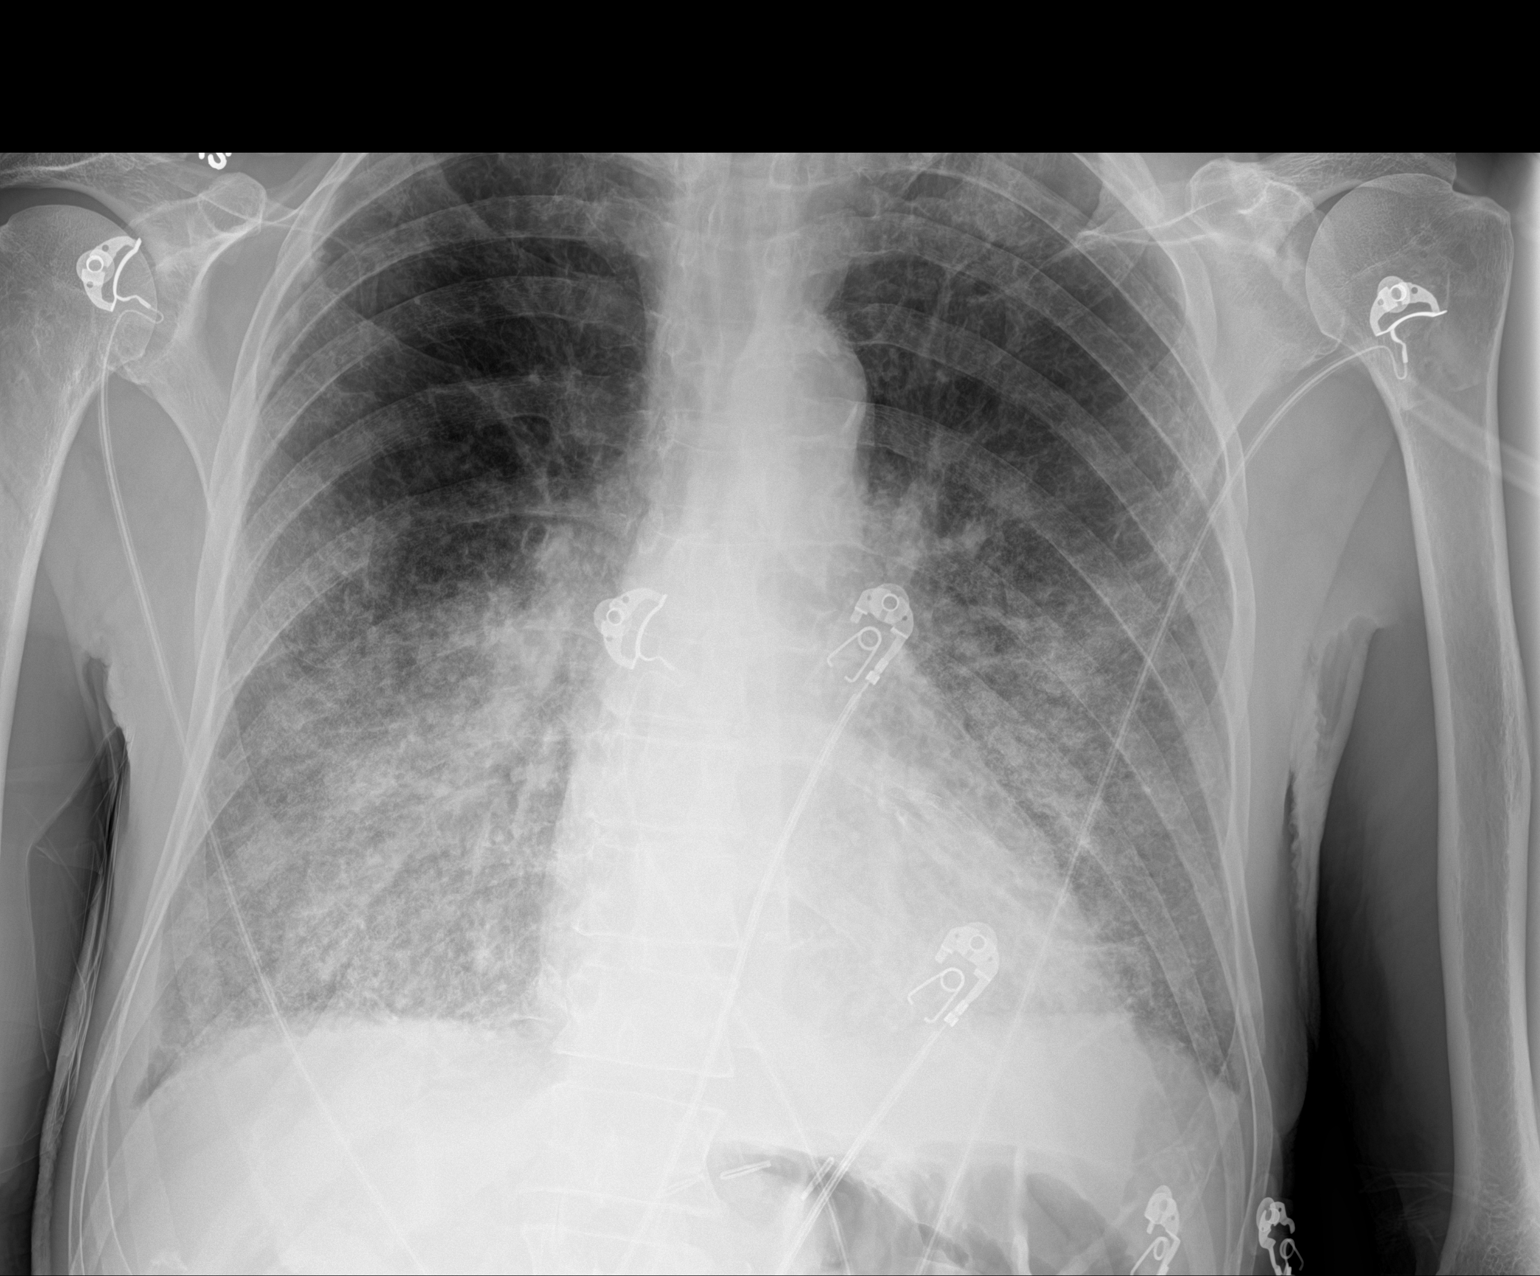

[1 of 1 positions shown; findings below may reference images not displayed]

FINDINGS: Unchanged heart size and mediastinal contours. Prominent bilateral
perihilar and infrahilar patchy opacities. Small bilateral pleural
effusions. No pneumothorax. No acute osseous abnormalities.
IMPRESSION: Prominent bilateral perihilar and infrahilar patchy opacities.
Differential considerations include pulmonary edema, pneumonia or
aspiration. Small pleural effusions.

## 2018-02-28 MED ORDER — SODIUM CHLORIDE 0.9 % IV SOLN: 250.0000 mL | INTRAVENOUS | Status: DC | PRN

## 2018-02-28 MED ORDER — FUROSEMIDE 10 MG/ML IJ SOLN
40.0000 mg | Freq: Two times a day (BID) | INTRAMUSCULAR | Status: DC
  Administered 2018-02-28 – 2018-03-01 (×3): 40 mg via INTRAVENOUS
  Filled 2018-02-28 (×3): qty 4

## 2018-02-28 MED ORDER — POTASSIUM CHLORIDE CRYS ER 20 MEQ PO TBCR
20.0000 meq | EXTENDED_RELEASE_TABLET | Freq: Every day | ORAL | Status: DC
  Administered 2018-02-28 – 2018-03-01 (×2): 20 meq via ORAL
  Filled 2018-02-28 (×2): qty 1

## 2018-02-28 MED ORDER — ONDANSETRON HCL 4 MG/2ML IJ SOLN: 4.0000 mg | Freq: Four times a day (QID) | INTRAMUSCULAR | Status: DC | PRN

## 2018-02-28 MED ORDER — ZOLPIDEM TARTRATE 5 MG PO TABS
5.0000 mg | ORAL_TABLET | Freq: Every evening | ORAL | Status: AC | PRN
  Administered 2018-02-28: 5 mg via ORAL
  Filled 2018-02-28: qty 1

## 2018-02-28 MED ORDER — POTASSIUM CHLORIDE 20 MEQ/15ML (10%) PO SOLN
40.0000 meq | Freq: Once | ORAL | Status: AC
  Administered 2018-02-28: 40 meq via ORAL
  Filled 2018-02-28: qty 30

## 2018-02-28 MED ORDER — SODIUM CHLORIDE 0.9% FLUSH
3.0000 mL | Freq: Two times a day (BID) | INTRAVENOUS | Status: DC
  Administered 2018-02-28 – 2018-03-01 (×2): 3 mL via INTRAVENOUS

## 2018-02-28 MED ORDER — PERFLUTREN LIPID MICROSPHERE
1.0000 mL | INTRAVENOUS | Status: AC | PRN
  Administered 2018-02-28: 2 mL via INTRAVENOUS
  Filled 2018-02-28: qty 10

## 2018-02-28 MED ORDER — SODIUM CHLORIDE 0.9% FLUSH: 3.0000 mL | INTRAVENOUS | Status: DC | PRN

## 2018-02-28 MED ORDER — ASPIRIN 81 MG PO CHEW
243.0000 mg | CHEWABLE_TABLET | Freq: Once | ORAL | Status: AC
  Administered 2018-02-28: 243 mg via ORAL
  Filled 2018-02-28: qty 3

## 2018-02-28 MED ORDER — ORAL CARE MOUTH RINSE: 15.0000 mL | Freq: Two times a day (BID) | OROMUCOSAL | Status: DC

## 2018-02-28 MED ORDER — FUROSEMIDE 10 MG/ML IJ SOLN
40.0000 mg | Freq: Once | INTRAMUSCULAR | Status: AC
Start: 2018-02-28 — End: 2018-02-28
  Administered 2018-02-28: 40 mg via INTRAVENOUS
  Filled 2018-02-28: qty 4

## 2018-02-28 MED ORDER — SODIUM CHLORIDE 0.9 % IV SOLN
1.0000 g | Freq: Once | INTRAVENOUS | Status: AC
  Administered 2018-02-28: 1 g via INTRAVENOUS
  Filled 2018-02-28: qty 10

## 2018-02-28 MED ORDER — ALBUTEROL SULFATE (2.5 MG/3ML) 0.083% IN NEBU
2.5000 mg | INHALATION_SOLUTION | RESPIRATORY_TRACT | Status: DC | PRN
Start: 2018-02-28 — End: 2018-03-01

## 2018-02-28 MED ORDER — MAGNESIUM SULFATE 2 GM/50ML IV SOLN
2.0000 g | Freq: Once | INTRAVENOUS | Status: AC
  Administered 2018-02-28: 2 g via INTRAVENOUS
  Filled 2018-02-28: qty 50

## 2018-02-28 MED ORDER — CARVEDILOL 12.5 MG PO TABS
12.5000 mg | ORAL_TABLET | Freq: Two times a day (BID) | ORAL | Status: DC
  Administered 2018-02-28 – 2018-03-01 (×2): 12.5 mg via ORAL
  Filled 2018-02-28 (×2): qty 1

## 2018-02-28 MED ORDER — HEPARIN SODIUM (PORCINE) 5000 UNIT/ML IJ SOLN
5000.0000 [IU] | Freq: Three times a day (TID) | INTRAMUSCULAR | Status: DC
  Administered 2018-02-28 (×3): 5000 [IU] via SUBCUTANEOUS
  Filled 2018-02-28 (×4): qty 1

## 2018-02-28 MED ORDER — LOSARTAN POTASSIUM 25 MG PO TABS
12.5000 mg | ORAL_TABLET | Freq: Every day | ORAL | Status: DC
  Administered 2018-02-28 – 2018-03-01 (×2): 12.5 mg via ORAL
  Filled 2018-02-28 (×2): qty 1

## 2018-02-28 MED ORDER — POTASSIUM CHLORIDE 10 MEQ/100ML IV SOLN
10.0000 meq | INTRAVENOUS | Status: AC
  Administered 2018-02-28 (×3): 10 meq via INTRAVENOUS
  Filled 2018-02-28 (×3): qty 100

## 2018-02-28 MED ORDER — NITROGLYCERIN 0.4 MG SL SUBL: 0.4000 mg | SUBLINGUAL_TABLET | SUBLINGUAL | Status: DC | PRN

## 2018-02-28 MED ORDER — ACETAMINOPHEN 325 MG PO TABS: 650.0000 mg | ORAL_TABLET | ORAL | Status: DC | PRN

## 2018-02-28 NOTE — H&P (Signed)
[facility] History and Physical  [name] BTD:[id number] DOB: 01-09-1930 DOA: 02/28/2018  Referring physician:  PCP: [name] Leach, MD   Chief Complaint: "I just could not catch my breath."  HPI: [name] [name] is a 84 y.o. male with past medical history significant for CKD, coronary disease, dysphonia, congestive heart failure and recent death of spouse in the last month presents to the emergency room with chief complaint of shortness of breath.  Patient states has been off his Lasix for more than 3 weeks.  States he was taken off this medication due to orthostatic hypotension.  Patient acutely became short of breath over the last 3 days.  Has been trying to get his wife's affairs in order.  Resisted coming in for medical care.  Is activated and O2 saturation noted to be in 80s.  Patient brought to the emergency room for evaluation.  ED course: In the emergency room chest x-ray showed congestive heart failure.  Patient given Lasix.  Very effective.  Initial troponin negative. Pt placed on bipap. Hospitalist consulted for admission.   Review of Systems:  As per HPI otherwise 10 point review of systems negative.    Past Medical History:  Diagnosis Date  . Carotid artery occlusion   . Chronic systolic CHF (congestive heart failure) ([place])    a. 07/2013 Echo: EF 35-40%  . CKD (chronic kidney disease), stage III ([place])    pt. states that he does not have CKD  . CML (chronic myelocytic leukemia) ([place])   . Coronary artery disease    a. 09/2013 Cath: LM min irregs, LAD 95p(2.75x20 Promus Premier DES & 3.0x20 Promus Premier DES overlapping), D1 50-60, LCX 30p, OM1 81m, OM2 60-70p, RCA small, nl.  . Diverticulitis   . Dysphonia 01/08/2017  . History of hiatal hernia   . History of kidney stones   . History of small bowel obstruction   . Hyperlipidemia    denies  . Ischemic cardiomyopathy    a. 07/2013 Echo: EF 35-40%, apical septal, apical lat, apical AK, mod MR.  . Small bowel  perforation ([place])    a. 11/2012 s/p emergent SB resection.  . 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   Past Surgical History:  Procedure Laterality Date  . BOWEL RESECTION     3cm sm intestine  . CARDIAC CATHETERIZATION  10/09/2013  . CAROTID ENDARTERECTOMY    . CHOLECYSTECTOMY    . COLON SURGERY  11/2012   BOWEL SURGERY   . CORONARY ANGIOPLASTY  10/09/2013   MID LAD  . ENDARTERECTOMY Right 09/17/2015   Procedure: Right Carotid ENDARTERECTOMY with Xenosure Patch Angioplasty;  Surgeon: [name] Mitchell, MD;  Location: [place];  Service: Vascular;  Laterality: Right;  . EYE SURGERY Bilateral   . INGUINAL HERNIA REPAIR Bilateral   . LAPAROSCOPIC CHOLECYSTECTOMY    . LAPAROTOMY N/A 10/14/2017   Procedure: EXPLORATORY LAPAROTOMY for SMALL BOWEL OBSTRUCTION, cecopexy;  Surgeon: [name] Seltzer, MD;  [facility];  Service: General;  Laterality: N/A;  . LAPAROTOMY N/A 11/09/2017   Procedure: EXPLORATORY LAPAROTOMY reduction small bowel valvulous;  Surgeon: [name] Bookbinder, MD;  Location: [facility];  Service: General;  Laterality: N/A;  . LAPAROTOMY N/A 01/10/2018   Procedure: exploratory laparotomy, lysis of adhesions, decompression of midgut volvulus;  Surgeon: [name] Kussmaul, MD;  [facility];  Service: General;  Laterality: N/A;  . [place] N/A 10/09/2013   Procedure: LEFT HEART CATHETERIZATION WITH CORONARY ANGIOGRAM;  Surgeon: [name] Dresser, MD;  Location:  [facility];  Service: Cardiovascular;  Laterality: N/A;  . PERCUTANEOUS CORONARY STENT INTERVENTION (PCI-S)  10/09/2013   Procedure: PERCUTANEOUS CORONARY STENT INTERVENTION (PCI-S);  Surgeon: [name] Dresser, MD;  Location: [facility] CATH LAB;  Service: Cardiovascular;;   Social History:  reports that he quit smoking about 18 years ago. His smoking use included cigarettes. He has a 45.00 pack-year smoking history. He has never used smokeless tobacco. He reports that he does not drink alcohol or use  drugs.  Allergies  Allergen Reactions  . Percocet [Oxycodone-Acetaminophen] Itching  . Metronidazole Other (See Comments)    Insomnia and nervousness    Family History  Problem Relation Age of Onset  . Emphysema Father        died @ 71- smoked  . Heart disease Father   . Kidney Stones Father   . Diabetes Mother        died @ 81  . Stroke Mother   . Head & neck cancer Brother   . Lymphoma Brother   . Colon cancer Neg Hx   . Stomach cancer Neg Hx   . Rectal cancer Neg Hx   . Esophageal cancer Neg Hx   . Liver cancer Neg Hx      Prior to Admission medications   Medication Sig Start Date End Date Taking? Authorizing Provider  acetaminophen (TYLENOL) 325 MG tablet You can take plain Tylenol 2 tablets every 4 hours for mild pain.  You can take the prescribed pain medicine for pain not relieved by plain Tylenol.  DO NOT TAKE MORE THAN 4000 MG OF TYLENOL PER DAY.  IT CAN HARM YOUR LIVER.  TYLENOL (ACETAMINOPHEN) IS ALSO IN YOUR PRESCRIPTION PAIN MEDICATION.  YOU HAVE TO COUNT IT IN YOUR DAILY TOTAL. Patient taking differently: Take 650 mg by mouth every 4 (four) hours as needed for mild pain.  10/19/17  Yes [name] Regal, PA-C  acetaminophen-codeine (TYLENOL #3) 300-30 MG tablet Take 1-2 tablets by mouth every 4 (four) hours as needed for severe pain (pain not relieved by plain Tylenol). 01/16/18  Yes [name] Regal, PA-C  Ascorbic Acid (VITAMIN C PO) Take 1,000 mg by mouth daily.    Yes [provider]  aspirin EC 81 MG tablet Take 1 tablet (81 mg total) by mouth daily. 10/02/13  Yes [name] Dresser, MD  BOSULIF 100 MG tablet Call Dr. Harlow [name], make an appointment and discuss when to resume this medicine. Patient taking differently: Take 400 mg by mouth daily with breakfast. Call Dr. Harlow [name], make an appointment and discuss when to resume this medicine. 10/19/17  Yes [name] Regal, PA-C  calcium carbonate (CALCIUM 600) 600 MG TABS tablet Resume this as before next week  when you are eating better. Patient taking differently: Take 600 mg by mouth daily with breakfast. Resume this as before next week when you are eating better. 10/19/17  Yes [name] Regal, PA-C  carvedilol (COREG) 12.5 MG tablet TAKE 1/2 TABLET TWICE DAILY WITH A MEAL 01/04/18  Yes Bensimhon, Shaune Pascal, MD  cyanocobalamin 1000 MCG tablet Take 1,000 mcg by mouth daily.   Yes [provider]  losartan (COZAAR) 25 MG tablet TAKE 1/2 TABLET BY MOUTH DAILY 02/18/18  Yes [name] Dresser, MD  potassium chloride SA (K-DUR,KLOR-CON) 20 MEQ tablet Take 20 mEq by mouth daily.   Yes [provider]  Thiamine Mononitrate (VITAMIN B1 PO) Take 250 mg by mouth daily.   Yes [provider]   Physical Exam: Vitals:   02/28/18  1130 02/28/18 1200 02/28/18 1230 02/28/18 1300  BP: (!) 168/92 (!) 127/105 (!) 134/112 132/65  Pulse: 71 76 72 65  Resp: 18 19 18  (!) 21  Temp:      TempSrc:      SpO2: 98% 98% 98% 97%  Weight:      Height:        Wt Readings from Last 3 Encounters:  02/28/18 54.4 kg (120 lb)  01/13/18 52.2 kg (115 lb)  11/15/17 52.9 kg (116 lb 10 oz)    General:  Appears calm and comfortable; A&Ox3 Eyes:  PERRL, EOMI, normal lids, iris ENT:  grossly normal hearing, lips & tongue Neck:  no LAD, masses or thyromegaly Cardiovascular:  RRR, no m/r/g. No LE edema.  Respiratory:  CTA bilaterally, pos rales. Incr WOB Abdomen:  soft, ntnd Skin:  no rash or induration seen on limited exam Musculoskeletal:  grossly normal tone BUE/BLE Psychiatric:  grossly normal mood and affect, speech fluent and appropriate Neurologic:  CN 2-12 grossly intact, moves all extremities in coordinated fashion.          Labs on Admission:  Basic Metabolic Panel: Recent Labs  Lab 02/28/18 0422 02/28/18 0810  NA 140  --   K 3.2*  --   CL 106  --   CO2 23  --   GLUCOSE 139*  --   BUN 14  --   CREATININE 1.24 1.21  CALCIUM 7.8*  --   MG  --  1.6*   Liver Function Tests: Recent  Labs  Lab 02/28/18 0422  AST 38  ALT 29  ALKPHOS 113  BILITOT 0.4  PROT 6.5  ALBUMIN 3.2*   No results for input(s): LIPASE, AMYLASE in the last 168 hours. No results for input(s): AMMONIA in the last 168 hours. CBC: Recent Labs  Lab 02/28/18 0422 02/28/18 0810  WBC 11.9* 13.0*  NEUTROABS 8.8*  --   HGB 11.6* 12.4*  HCT 37.2* 38.8*  MCV 90.3 88.4  PLT 182 180   Cardiac Enzymes: Recent Labs  Lab 02/28/18 1000  TROPONINI 0.10*    BNP (last 3 results) Recent Labs    11/12/17 0428 02/28/18 0422  BNP 594.0* 801.0*    ProBNP (last 3 results) No results for input(s): PROBNP in the last 8760 hours.   Serum creatinine: 1.21 mg/dL 02/28/18 0810 Estimated creatinine clearance: 34.3 mL/min  CBG: No results for input(s): GLUCAP in the last 168 hours.  Radiological Exams on Admission: Dg Chest Port 1 View  Result Date: 02/28/2018 CLINICAL DATA:  Increased shortness of breath.  Productive cough. EXAM: PORTABLE CHEST 1 VIEW COMPARISON:  Radiograph 01/10/2018 FINDINGS: Unchanged heart size and mediastinal contours. Prominent bilateral perihilar and infrahilar patchy opacities. Small bilateral pleural effusions. No pneumothorax. No acute osseous abnormalities. IMPRESSION: Prominent bilateral perihilar and infrahilar patchy opacities. Differential considerations include pulmonary edema, pneumonia or aspiration. Small pleural effusions. Electronically Signed   By: Jeb Levering M.D.   On: 02/28/2018 04:30    EKG: Independently reviewed. NSR, no stemi.  Assessment/Plan Principal Problem:   Acute respiratory failure with hypoxia (HCC)   Resp failure 2/2  Acute heart failure Likely due to patient being off of his Lasix for weeks Serial troponin, will trend - [name] ordered, prn ntg sl for CP Diuresis bid 40mg  IV Fluid seen on CXR Echo done Ins and outs Continuous pulse ox Mild trop bump, will trend Cont ARB/BB, Kdur  Low Ca Corrected CA low Replaced IV Recheck in  AM  Low  Mg Replaced IV Recheck in AM  CKD III Monitor Cr daily Cr at baseline,  1.2-1.35  H/O CML Chronic elevated WBC, will monitor  Code Status: FC DVT Prophylaxis: heparin Family Communication: dgtr at bedside Disposition Plan: Pending Improvement  Status: inpt sdu  Elwin Mocha, MD Family Medicine [facility] [email or web address] Password [email or web address]

## 2018-02-28 NOTE — ED Triage Notes (Addendum)
Patient BIB [facility] on CPAP for increasing SOB since 2200 yesterday. Given 2 albuterol, 125mg  Solumedrol , 3 NTG by EMS. Patient also c/o productive cough since last night.

## 2018-02-28 NOTE — ED Notes (Signed)
Patient removed from BiPAP and placed on 8L high flow nasal canula.

## 2018-02-28 NOTE — Care Management (Signed)
This is a no charge note  Pending admission per Dr. Stark Jock,   84 year old woman with a past medical history of CHF with EF of 30-35%, hyperlipidemia, COPD, CML, CKD-3, who presents with shortness of breath.   Patient has been off Lasix for a while per ED physician. Has productive cough. Oxygen desaturated to 69% on room air.   Patient was found to have BNP 801, negative troponin, WBC 11.9, temperature normal, no tachycardia, has tachypnea, chest x-ray is consistent with pulmonary edema. Clinically consistent with CHF exacerbation per EDP. BiPAP was started. 40 mg Lasix was ordered. Potassium is 3.2--> 40 mEq of potassium chloride. Pt is admitted to stepdown as inpatient.    Ivor Costa, MD  [facility] Pager [phone]  If 7PM-7AM, please contact night-coverage [email or web address] Password [facility] 02/28/2018, 6:17 AM

## 2018-02-28 NOTE — ED Provider Notes (Addendum)
[facility] EMERGENCY DEPARTMENT Provider Note   CSN: [id number] Arrival date & time: 02/28/18  0404     History   Chief Complaint No chief complaint on file.   HPI [name] [name] is a 84 y.o. male.  Patient is an 84 year old male with past medical history of chronic renal insufficiency, congestive heart failure, and coronary artery disease.  He presents today for evaluation of difficulty breathing.  This started yesterday and is rapidly worsening.  He denies any fevers or chills.  He denies any chest pain or productive cough.  He does report mild swelling of his legs.  He was brought here by EMS on CPAP due to low oxygen saturations that were noted originally to be in the 70s.  The history is provided by the patient.    Past Medical History:  Diagnosis Date  . Carotid artery occlusion   . Chronic systolic CHF (congestive heart failure) ([place])    a. 07/2013 Echo: EF 35-40%  . CKD (chronic kidney disease), stage III ([place])    pt. states that he does not have CKD  . CML (chronic myelocytic leukemia) ([place])   . Coronary artery disease    a. 09/2013 Cath: LM min irregs, LAD 95p(2.75x20 Promus Premier DES & 3.0x20 Promus Premier DES overlapping), D1 50-60, LCX 30p, OM1 38m, OM2 60-70p, RCA small, nl.  . Diverticulitis   . Dysphonia 01/08/2017  . History of hiatal hernia   . History of kidney stones   . History of small bowel obstruction   . Hyperlipidemia   . Ischemic cardiomyopathy    a. 07/2013 Echo: EF 35-40%, apical septal, apical lat, apical AK, mod MR.  . Small bowel perforation ([place])    a. 11/2012 s/p emergent SB resection.  . 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    Patient Active Problem List   Diagnosis Date Noted  . Protein-calorie malnutrition, severe 01/14/2018  . 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 Acidosis, metabolic, with respiratory acidosis 11/10/2017  . Encounter for weaning from ventilator ([place]) 11/10/2017  . CKD (chronic kidney disease), stage  III ([place]) 11/10/2017  . Status post exploratory laparotomy 11/09/2017  . 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 Bilateral rales   . 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 Cough 08/17/2016  . Carotid stenosis 09/17/2015  . Right carotid bruit 12/14/2014  . Unstable angina ([place]) 10/10/2013  . Ischemic cardiomyopathy   . Hyperlipidemia   . Chronic systolic CHF (congestive heart failure) ([place]) 10/03/2013  . CAD (coronary artery disease) 10/03/2013  . Acute systolic heart failure ([place]) 08/11/2013  . Chronic kidney disease (CKD), stage III (moderate) (HCC) 08/11/2013  . Acute respiratory failure with hypoxia ([place]) 08/09/2013  . Pulmonary edema 08/09/2013  . Small bowel obstruction ([place]) 08/09/2013    Past Surgical History:  Procedure Laterality Date  . CARDIAC CATHETERIZATION  10/09/2013  . CAROTID ENDARTERECTOMY    . COLON SURGERY  11/2012   BOWEL SURGERY   . CORONARY ANGIOPLASTY  10/09/2013   MID LAD  . ENDARTERECTOMY Right 09/17/2015   Procedure: Right Carotid ENDARTERECTOMY with Xenosure Patch Angioplasty;  Surgeon: Serafina Mitchell, MD;  Location: [facility];  Service: Vascular;  Laterality: Right;  . EYE SURGERY Bilateral   . INGUINAL HERNIA REPAIR Bilateral   . LAPAROSCOPIC CHOLECYSTECTOMY    . LAPAROTOMY N/A 10/14/2017   Procedure: EXPLORATORY LAPAROTOMY for SMALL BOWEL OBSTRUCTION, cecopexy;  Surgeon: Excell Seltzer, MD;  [facility];  Service: General;  Laterality: N/A;  . LAPAROTOMY N/A 11/09/2017  Procedure: EXPLORATORY LAPAROTOMY reduction small bowel valvulous;  Surgeon: Rolm Bookbinder, MD;  [facility];  Service: General;  Laterality: N/A;  . LAPAROTOMY N/A 01/10/2018   Procedure: exploratory laparotomy, lysis of adhesions, decompression of midgut volvulus;  Surgeon: Jovita Kussmaul, MD;  [facility];  Service: General;  Laterality: N/A;  . [place] N/A 10/09/2013   Procedure: LEFT HEART CATHETERIZATION WITH CORONARY ANGIOGRAM;   Surgeon: Larey Dresser, MD;  Location: [facility] C[facility] LAB;  Service: Cardiovascular;  Laterality: N/A;  . PERCUTANEOUS CORONARY STENT INTERVENTION (PCI-S)  10/09/2013   Procedure: PERCUTANEOUS CORONARY STENT INTERVENTION (PCI-S);  Surgeon: Larey Dresser, MD;  Location: [facility] CATH LAB;  Service: Cardiovascular;;        Home Medications    Prior to Admission medications   Medication Sig Start Date End Date Taking? Authorizing Provider  acetaminophen (TYLENOL) 325 MG tablet You can take plain Tylenol 2 tablets every 4 hours for mild pain.  You can take the prescribed pain medicine for pain not relieved by plain Tylenol.  DO NOT TAKE MORE THAN 4000 MG OF TYLENOL PER DAY.  IT CAN HARM YOUR LIVER.  TYLENOL (ACETAMINOPHEN) IS ALSO IN YOUR PRESCRIPTION PAIN MEDICATION.  YOU HAVE TO COUNT IT IN YOUR DAILY TOTAL. 10/19/17   Earnstine Regal, PA-C  acetaminophen-codeine (TYLENOL #3) 300-30 MG tablet Take 1-2 tablets by mouth every 4 (four) hours as needed for severe pain (pain not relieved by plain Tylenol). 01/16/18   Earnstine Regal, PA-C  Ascorbic Acid (VITAMIN C PO) Take 1,000 mg by mouth daily.     [provider]  aspirin EC 81 MG tablet Take 1 tablet (81 mg total) by mouth daily. 10/02/13   Larey Dresser, MD  BOSULIF 100 MG tablet Call Dr. [name], make an appointment and discuss when to resume this medicine. Patient taking differently: Take 400 mg by mouth daily with breakfast. Call Dr. [name], make an appointment and discuss when to resume this medicine. 10/19/17   Earnstine Regal, PA-C  calcium carbonate (CALCIUM 600) 600 MG TABS tablet Resume this as before next week when you are eating better. Patient taking differently: Take 600 mg by mouth daily with breakfast. Resume this as before next week when you are eating better. 10/19/17   Earnstine Regal, PA-C  carvedilol (COREG) 12.5 MG tablet TAKE 1/2 TABLET TWICE DAILY WITH A MEAL 01/04/18   Bensimhon, Shaune Pascal, MD  cyanocobalamin 1000 MCG  tablet Take 1,000 mcg by mouth daily.    [provider]  losartan (COZAAR) 25 MG tablet Do not resume this until you have instruction from Dr. Sallyanne Havers.  Call for follow up in 2 weeks. Record your blood pressure and heart rate at home and take with you to see Dr. Sallyanne Havers. 01/15/18   Earnstine Regal, PA-C  losartan (COZAAR) 25 MG tablet TAKE 1/2 TABLET BY MOUTH DAILY 02/18/18   Larey Dresser, MD  Thiamine Mononitrate (VITAMIN B1 PO) Take 250 mg by mouth daily.    [provider]    Family History Family History  Problem Relation Age of Onset  . Emphysema Father        died @ 15- smoked  . Heart disease Father   . Kidney Stones Father   . Diabetes Mother        died @ 22  . Stroke Mother   . Head & neck cancer Brother   . Lymphoma Brother   . Colon cancer Neg Hx   .  Stomach cancer Neg Hx   . Rectal cancer Neg Hx   . Esophageal cancer Neg Hx   . Liver cancer Neg Hx     Social History Social History   Tobacco Use  . Smoking status: Former Smoker    Packs/day: 1.00    Years: 45.00    Pack years: 45.00    Types: Cigarettes    Last attempt to quit: 06/09/1999    Years since quitting: 18.7  . Smokeless tobacco: Never Used  Substance Use Topics  . Alcohol use: No    Alcohol/week: 0.0 oz  . Drug use: No     Allergies   Percocet [oxycodone-acetaminophen] and Metronidazole   Review of Systems Review of Systems  All other systems reviewed and are negative.    Physical Exam Updated Vital Signs There were no vitals taken for this visit.  Physical Exam  Constitutional: He is oriented to person, place, and time. He appears well-developed and well-nourished. No distress.  He appears somewhat pale.  HENT:  Head: Normocephalic and atraumatic.  Mouth/Throat: Oropharynx is clear and moist.  Neck: Normal range of motion. Neck supple.  Cardiovascular: Normal rate and regular rhythm. Exam reveals no friction rub.  No murmur heard. Pulmonary/Chest: He is in  respiratory distress. He has no wheezes. He has rales.  Patient is in moderate respiratory distress.  He is having difficulty finishing sentences.  There are rales in the bases bilaterally.  Abdominal: Soft. Bowel sounds are normal. He exhibits no distension. There is no tenderness.  Musculoskeletal: Normal range of motion. He exhibits no edema.  Neurological: He is alert and oriented to person, place, and time. Coordination normal.  Skin: Skin is warm and dry. He is not diaphoretic.  Nursing note and vitals reviewed.    ED Treatments / Results  Labs (all labs ordered are listed, but only abnormal results are displayed) Labs Reviewed - No data to display  ED ECG REPORT   Date: 03/16/2018  Rate: 82  Rhythm: normal sinus rhythm  QRS Axis: normal  Intervals: normal  ST/T Wave abnormalities: nonspecific T wave changes  Conduction Disutrbances:none  Narrative Interpretation:   Old EKG Reviewed: unchanged  I have personally reviewed the EKG tracing and agree with the computerized printout as noted.   Radiology No results found.  Procedures Procedures (including critical care time)  Medications Ordered in ED Medications  furosemide (LASIX) injection 40 mg (has no administration in time range)     Initial Impression / Assessment and Plan / ED Course  I have reviewed the triage vital signs and the nursing notes.  Pertinent labs & imaging results that were available during my care of the patient were reviewed by me and considered in my medical decision making (see chart for details).  Patient with history of CHF presenting with shortness of breath.  He was initially hypoxic with saturations in the low 70s, requiring BiPAP.  He was ultimately able to be weaned off of the BiPAP and is now tolerating oxygen by nasal cannula with saturations in the low 90s.  His BNP is elevated and chest x-ray is suggestive of CHF.  He will be admitted to the hospitalist service under the care of Dr.  Reola Calkins.  CRITICAL CARE Performed by: Veryl Speak Total critical care time: 35 minutes Critical care time was exclusive of separately billable procedures and treating other patients. Critical care was necessary to treat or prevent imminent or life-threatening deterioration. Critical care was time spent personally by me on  the following activities: development of treatment plan with patient and/or surrogate as well as nursing, discussions with consultants, evaluation of patient's response to treatment, examination of patient, obtaining history from patient or surrogate, ordering and performing treatments and interventions, ordering and review of laboratory studies, ordering and review of radiographic studies, pulse oximetry and re-evaluation of patient's condition.   Final Clinical Impressions(s) / ED Diagnoses   Final diagnoses:  None    ED Discharge Orders    None       Veryl Speak, MD 02/28/18 9629    Veryl Speak, MD 03/16/18 [phone]

## 2018-02-28 NOTE — ED Notes (Signed)
Patients wants to leave AMA. He asked RN to call the provider. MD paged via [facility] [place]

## 2018-02-28 NOTE — Progress Notes (Signed)
  Echocardiogram 2D Echocardiogram with definity has been performed.  [name] [name] M 02/28/2018, 11:04 AM

## 2018-02-28 NOTE — ED Notes (Signed)
Pt refusing rectal temp check, stating oral temp should be sufficient. Oral temp 98.8.

## 2018-02-28 NOTE — ED Notes (Signed)
Patient notified that he has a bed. He states that the wants to see the provider and he thinks he still will like to leave and go home

## 2018-02-28 NOTE — ED Notes (Signed)
Patient started on 5L nasal canula per Dr. Stark Jock. O2 dropped to 69%. Restarted BiPAP due to worsening dyspnea and respiratory distress.

## 2018-02-28 NOTE — ED Notes (Signed)
Patient does not want his medicine. He states that he is thinking about leaving.

## 2018-02-28 NOTE — Progress Notes (Signed)
Removed patient from bipap and placed on 8lpm high flow cannula.

## 2018-03-01 ENCOUNTER — Encounter (HOSPITAL_COMMUNITY): Payer: Self-pay | Admitting: *Deleted

## 2018-03-01 DIAGNOSIS — J9601 Acute respiratory failure with hypoxia: Secondary | ICD-10-CM | POA: Diagnosis not present

## 2018-03-01 LAB — BASIC METABOLIC PANEL
Anion gap: 10 (ref 5–15)
BUN: 29 mg/dL — ABNORMAL HIGH (ref 6–20)
CO2: 22 mmol/L (ref 22–32)
Calcium: 7.9 mg/dL — ABNORMAL LOW (ref 8.9–10.3)
Chloride: 104 mmol/L (ref 101–111)
Creatinine, Ser: 1.41 mg/dL — ABNORMAL HIGH (ref 0.61–1.24)
GFR calc Af Amer: 51 mL/min — ABNORMAL LOW (ref >60)
GFR calc non Af Amer: 44 mL/min — ABNORMAL LOW (ref >60)
Glucose, Bld: 106 mg/dL — ABNORMAL HIGH (ref 65–99)
Potassium: 4.2 mmol/L (ref 3.5–5.1)
Sodium: 136 mmol/L (ref 135–145)

## 2018-03-01 LAB — LIPID PANEL
Cholesterol: 117 mg/dL (ref 0–200)
HDL: 37 mg/dL — ABNORMAL LOW (ref >40)
LDL Cholesterol: 72 mg/dL (ref 0–99)
Total CHOL/HDL Ratio: 3.2 RATIO
Triglycerides: 39 mg/dL (ref <150)
VLDL: 8 mg/dL (ref 0–40)

## 2018-03-01 LAB — MAGNESIUM: Magnesium: 2.1 mg/dL (ref 1.7–2.4)

## 2018-03-01 MED ORDER — FUROSEMIDE 40 MG PO TABS: 40.0000 mg | ORAL_TABLET | Freq: Every day | ORAL | 0 refills | Status: DC

## 2018-03-01 MED ORDER — POTASSIUM CHLORIDE CRYS ER 20 MEQ PO TBCR: 20.0000 meq | EXTENDED_RELEASE_TABLET | Freq: Every day | ORAL | 0 refills | Status: DC

## 2018-03-01 NOTE — Care Management Note (Signed)
Case Management Note  Patient Details  Name: [name] [name] MRN: [id number] Date of Birth: 11/26/1929  Subjective/Objective:  Pt presented for Acute Respiratory Failure. Plan for Lasix at home. PTA from home alone- Pt's wife died a week ago. Per pt he is independent and declines Reader at this time. CM did make pt aware to contact PCP for [facility] Needs in the future.                    Action/Plan: No further needs from CM @ this time.   Expected Discharge Date:  03/01/18               Expected Discharge Plan:  Home/Self Care  In-House Referral:  NA  Discharge planning Services  CM Consult  Post Acute Care Choice:  NA Choice offered to:  NA  DME Arranged:  N/A DME Agency:  NA  HH Arranged:  Patient Refused [place] Agency:  NA  Status of Service:  Completed, signed off  If discussed at [place] of Stay Meetings, dates discussed:    Additional Comments:  Bethena Roys, RN 03/01/2018, 1:03 PM

## 2018-03-01 NOTE — Progress Notes (Signed)
NIV was taken off of patient at 0557 on 4/11/219.

## 2018-03-01 NOTE — Care Management CC44 (Signed)
Condition Code 44 Documentation Completed  Patient Details  Name: [name] [name] MRN: [id number] Date of Birth: 12/20/1929   Condition Code 44 given:  Yes Patient signature on Condition Code 44 notice:  Yes Documentation of 2 MD's agreement:  Yes Code 44 added to claim:  Yes    Bethena Roys, RN 03/01/2018, 1:51 PM

## 2018-03-01 NOTE — Discharge Summary (Signed)
Physician Discharge Summary  [name] [name] DUK:[id number] DOB: [birth year] DOA: 02/28/2018  PCP: Drake Leach, MD  Admit date: 02/28/2018 Discharge date: 03/01/2018  Admitted From: Home Disposition:  Home  Discharge Condition: Stable CODE STATUS:Full Diet recommendation: Heart Healthy   Brief/Interim Summary:  Admission H and P:  [name] [name] is a 84 y.o. male with past medical history significant for CKD, coronary disease, dysphonia, congestive heart failure and recent death of spouse in the last month presents to the emergency room with chief complaint of shortness of breath.  Patient states has been off his Lasix for more than 3 weeks.  States he was taken off this medication due to orthostatic hypotension.  Patient acutely became short of breath over the last 3 days.  Has been trying to get his wife's affairs in order.  Resisted coming in for medical care.  Is activated and O2 saturation noted to be in 80s.  Patient brought to the emergency room for evaluation.  ED course: In the emergency room chest x-ray showed congestive heart failure.  Patient given Lasix.  Very effective.  Initial troponin negative. Pt placed on bipap. Hospitalist consulted for admission.  Hospital Course: Patient was started on IV diuresis.  Patient's respiratory status rapidly improved this morning.  Currently he is saturating fine on room air.  He does not have any peripheral edema.  His lungs are clear.  He underwent echocardiogram and the findings is not significantly different from the last echo he had. Patient is stable for discharge to home today.  Will recommend to continue Lasix at home.  He will follow-up with his PCP and cardiologist as an outpatient.  Problems were addressed during his hospitalization:  Resp failure 2/2   CHF exacerbation: Likely due to patient being off of his Lasix for weeks Mildly elevated troponin.Did not trend up . Started on Lasix 40 mg twice daily with significant  improvement. Echo report shows left ventricular ejection fraction of 30-35%, moderate concentric left ventricular hypertrophy, akinesis of the mid-apicalanteroseptal,anterior, and apical myocardium .  This finding is similar to the last echocardiogram he had.  Hypomagnesemia/hypokalemia/hypocalcemia : Supplemented and corrected.  CKD III Kidney function on baseline. Cr at baseline,  1.2-1.35 Check BMP in a week.  H/O CML Chronic elevated WBC Follows with hematology- oncology On Bisulif at home.  Check CBC in a week.     Discharge Diagnoses:  Principal Problem:   Acute respiratory failure with hypoxia [facility])    Discharge Instructions  Discharge Instructions    Diet - low sodium heart healthy   Complete by:  As directed    Discharge instructions   Complete by:  As directed    1) Take prescribed medications as instructed. 2) Follow up with your PCP in a week.  Do CBC and BMP testing a week. 3)Follow up with your cardiologist as an outpatient. 4) Take less than 1.2 L of fluid a day and less than 2 g of salt a day.   Increase activity slowly   Complete by:  As directed      Allergies as of 03/01/2018      Reactions   Percocet [oxycodone-acetaminophen] Itching   Metronidazole Other (See Comments)   Insomnia and nervousness      Medication List    TAKE these medications   acetaminophen 325 MG tablet Commonly known as:  TYLENOL You can take plain Tylenol 2 tablets every 4 hours for mild pain.  You can take the prescribed pain medicine for pain not relieved  by plain Tylenol.  DO NOT TAKE MORE THAN 4000 MG OF TYLENOL PER DAY.  IT CAN HARM YOUR LIVER.  TYLENOL (ACETAMINOPHEN) IS ALSO IN YOUR PRESCRIPTION PAIN MEDICATION.  YOU HAVE TO COUNT IT IN YOUR DAILY TOTAL. What changed:    how much to take  how to take this  when to take this  reasons to take this  additional instructions   acetaminophen-codeine 300-30 MG tablet Commonly known as:  TYLENOL #3 Take 1-2  tablets by mouth every 4 (four) hours as needed for severe pain (pain not relieved by plain Tylenol).   aspirin EC 81 MG tablet Take 1 tablet (81 mg total) by mouth daily.   BOSULIF 100 MG tablet Generic drug:  bosutinib Call Dr. [name], make an appointment and discuss when to resume this medicine. What changed:    how much to take  how to take this  when to take this  additional instructions   calcium carbonate 600 MG Tabs tablet Commonly known as:  CALCIUM 600 Resume this as before next week when you are eating better. What changed:    how much to take  how to take this  when to take this  additional instructions   carvedilol 12.5 MG tablet Commonly known as:  COREG TAKE 1/2 TABLET TWICE DAILY WITH A MEAL   cyanocobalamin 1000 MCG tablet Take 1,000 mcg by mouth daily.   furosemide 40 MG tablet Commonly known as:  LASIX Take 1 tablet (40 mg total) by mouth daily. Start taking on:  03/02/2018   losartan 25 MG tablet Commonly known as:  COZAAR TAKE 1/2 TABLET BY MOUTH DAILY   potassium chloride SA 20 MEQ tablet Commonly known as:  K-DUR,KLOR-CON Take 1 tablet (20 mEq total) by mouth daily. Start taking on:  03/02/2018   VITAMIN B1 PO Take 250 mg by mouth daily.   VITAMIN C PO Take 1,000 mg by mouth daily.      Follow-up Information    Drake Leach, MD. Schedule an appointment as soon as possible for a visit in 1 week(s).   Specialty:  Internal Medicine Contact information: [address] [place] [address] [phone]          Allergies  Allergen Reactions  . Percocet [Oxycodone-Acetaminophen] Itching  . Metronidazole Other (See Comments)    Insomnia and nervousness    Consultations:  None   Procedures/Studies: Dg Chest Port 1 View  Result Date: 02/28/2018 CLINICAL DATA:  Increased shortness of breath.  Productive cough. EXAM: PORTABLE CHEST 1 VIEW COMPARISON:  Radiograph 01/10/2018 FINDINGS: Unchanged heart size and  mediastinal contours. Prominent bilateral perihilar and infrahilar patchy opacities. Small bilateral pleural effusions. No pneumothorax. No acute osseous abnormalities. IMPRESSION: Prominent bilateral perihilar and infrahilar patchy opacities. Differential considerations include pulmonary edema, pneumonia or aspiration. Small pleural effusions. Electronically Signed   By: Jeb Levering M.D.   On: 02/28/2018 04:30    (Echo, Carotid, EGD, Colonoscopy, ERCP)    Subjective: Patient seen and examined the bedside this morning.  Remains stable.  Respiratory status is stable.  Does not complain of any shortness of breath.  Does not have any peripheral edema.  Lungs are clear.  Discharge Exam: Vitals:   03/01/18 0542 03/01/18 0834  BP: 128/73 135/70  Pulse: 66 (!) 59  Resp: 20   Temp: 97.6 F (36.4 C) (!) 97.4 F (36.3 C)  SpO2: 94% 97%   Vitals:   03/01/18 0000 03/01/18 0500 03/01/18 0542 03/01/18 0834  BP: 120/61  128/73 135/70  Pulse: (!) 59  66 (!) 59  Resp:   20   Temp: 98 F (36.7 C)  97.6 F (36.4 C) (!) 97.4 F (36.3 C)  TempSrc: Axillary  Oral Oral  SpO2: 99%  94% 97%  Weight:  52.4 kg (115 lb 9.6 oz)    Height:        General: Pt is alert, awake, not in acute distress Cardiovascular: RRR, S1/S2 +, no rubs, no gallops Respiratory: CTA bilaterally, no wheezing, no rhonchi Abdominal: Soft, NT, ND, bowel sounds + Extremities: no edema, no cyanosis    The results of significant diagnostics from this hospitalization (including imaging, microbiology, ancillary and laboratory) are listed below for reference.     Microbiology: Recent Results (from the past 240 hour(s))  MRSA PCR Screening     Status: None   Collection Time: 02/28/18  6:29 PM  Result Value Ref Range Status   MRSA by PCR NEGATIVE NEGATIVE Final    Comment:        The GeneXpert MRSA Assay (FDA approved for NASAL specimens only), is one component of a comprehensive MRSA colonization surveillance  program. It is not intended to diagnose MRSA infection nor to guide or monitor treatment for MRSA infections. Performed at [facility], [place] [address] [place], [place] [address]      Labs: BNP (last 3 results) Recent Labs    11/12/17 0428 02/28/18 0422  BNP 594.0* 676.1*   Basic Metabolic Panel: Recent Labs  Lab 02/28/18 0422 02/28/18 0810 03/01/18 0501  NA 140  --  136  K 3.2*  --  4.2  CL 106  --  104  CO2 23  --  22  GLUCOSE 139*  --  106*  BUN 14  --  29*  CREATININE 1.24 1.21 1.41*  CALCIUM 7.8*  --  7.9*  MG  --  1.6* 2.1   Liver Function Tests: Recent Labs  Lab 02/28/18 0422  AST 38  ALT 29  ALKPHOS 113  BILITOT 0.4  PROT 6.5  ALBUMIN 3.2*   No results for input(s): LIPASE, AMYLASE in the last 168 hours. No results for input(s): AMMONIA in the last 168 hours. CBC: Recent Labs  Lab 02/28/18 0422 02/28/18 0810  WBC 11.9* 13.0*  NEUTROABS 8.8*  --   HGB 11.6* 12.4*  HCT 37.2* 38.8*  MCV 90.3 88.4  PLT 182 180   Cardiac Enzymes: Recent Labs  Lab 02/28/18 1000 02/28/18 1842  TROPONINI 0.10* 0.09*   BNP: Invalid input(s): POCBNP CBG: No results for input(s): GLUCAP in the last 168 hours. D-Dimer No results for input(s): DDIMER in the last 72 hours. Hgb A1c No results for input(s): HGBA1C in the last 72 hours. Lipid Profile Recent Labs    03/01/18 0501  CHOL 117  HDL 37*  LDLCALC 72  TRIG 39  CHOLHDL 3.2   Thyroid function studies No results for input(s): TSH, T4TOTAL, T3FREE, THYROIDAB in the last 72 hours.  Invalid input(s): FREET3 Anemia work up No results for input(s): VITAMINB12, FOLATE, FERRITIN, TIBC, IRON, RETICCTPCT in the last 72 hours. Urinalysis    Component Value Date/Time   COLORURINE YELLOW 01/10/2018 1630   APPEARANCEUR CLEAR 01/10/2018 1630   LABSPEC 1.025 01/10/2018 1630   PHURINE 5.5 01/10/2018 1630   GLUCOSEU NEGATIVE 01/10/2018 1630   HGBUR TRACE (A) 01/10/2018 1630   BILIRUBINUR NEGATIVE  01/10/2018 1630   KETONESUR NEGATIVE 01/10/2018 1630   PROTEINUR 30 (A) 01/10/2018 1630  UROBILINOGEN 0.2 09/06/2015 1935   NITRITE NEGATIVE 01/10/2018 1630   LEUKOCYTESUR NEGATIVE 01/10/2018 1630   Sepsis Labs Invalid input(s): PROCALCITONIN,  WBC,  LACTICIDVEN Microbiology Recent Results (from the past 240 hour(s))  MRSA PCR Screening     Status: None   Collection Time: 02/28/18  6:29 PM  Result Value Ref Range Status   MRSA by PCR NEGATIVE NEGATIVE Final    Comment:        The GeneXpert MRSA Assay (FDA approved for NASAL specimens only), is one component of a comprehensive MRSA colonization surveillance program. It is not intended to diagnose MRSA infection nor to guide or monitor treatment for MRSA infections. Performed at [facility], [place] [address], [place], [place] [address]      Time coordinating discharge: 35 minutes  SIGNED:   Shelly Coss, MD  [facility] 03/01/2018, 11:47 AM Pager [phone]  If 7PM-7AM, please contact night-coverage [email or web address] Password [email or web address]

## 2018-03-01 NOTE — Progress Notes (Signed)
Discharge instructions reviewed with pt. Pt has no questions at this time. Pt stated he is ready to be d/c. Pt stated he would schedule his follow up appointment with his PCP.

## 2018-03-01 NOTE — Care Management Obs Status (Signed)
[place] NOTIFICATION   Patient Details  Name: Maurizio Geno MRN: [id number] Date of Birth: 16-Jan-1930   Medicare Observation Status Notification Given:  Yes    Bethena Roys, RN 03/01/2018, 1:51 PM

## 2018-03-02 LAB — CALCIUM, IONIZED: Calcium, Ionized, Serum: 4.5 mg/dL (ref 4.5–5.6)

## 2018-03-05 ENCOUNTER — Ambulatory Visit (HOSPITAL_COMMUNITY)
Admission: RE | Admit: 2018-03-05 | Discharge: 2018-03-05 | Disposition: A | Discharge status: Home / Self Care | Payer: Medicare HMO | Source: Ambulatory Visit | Attending: Cardiology | Admitting: Cardiology

## 2018-03-05 ENCOUNTER — Encounter (HOSPITAL_COMMUNITY): Payer: Self-pay | Admitting: Cardiology

## 2018-03-05 ENCOUNTER — Other Ambulatory Visit: Payer: Self-pay

## 2018-03-05 VITALS — BP 150/60 | HR 73 | Wt 116.0 lb

## 2018-03-05 DIAGNOSIS — Z79899 Other long term (current) drug therapy: Secondary | ICD-10-CM | POA: Diagnosis not present

## 2018-03-05 DIAGNOSIS — I2510 Atherosclerotic heart disease of native coronary artery without angina pectoris: Secondary | ICD-10-CM | POA: Diagnosis not present

## 2018-03-05 DIAGNOSIS — N183 Chronic kidney disease, stage 3 unspecified: Secondary | ICD-10-CM

## 2018-03-05 DIAGNOSIS — Z7982 Long term (current) use of aspirin: Secondary | ICD-10-CM | POA: Diagnosis not present

## 2018-03-05 DIAGNOSIS — R946 Abnormal results of thyroid function studies: Secondary | ICD-10-CM | POA: Diagnosis not present

## 2018-03-05 DIAGNOSIS — I6529 Occlusion and stenosis of unspecified carotid artery: Secondary | ICD-10-CM | POA: Diagnosis not present

## 2018-03-05 DIAGNOSIS — R0683 Snoring: Secondary | ICD-10-CM

## 2018-03-05 DIAGNOSIS — M791 Myalgia, unspecified site: Secondary | ICD-10-CM | POA: Diagnosis not present

## 2018-03-05 DIAGNOSIS — I5022 Chronic systolic (congestive) heart failure: Secondary | ICD-10-CM | POA: Insufficient documentation

## 2018-03-05 DIAGNOSIS — Z955 Presence of coronary angioplasty implant and graft: Secondary | ICD-10-CM | POA: Diagnosis not present

## 2018-03-05 DIAGNOSIS — E785 Hyperlipidemia, unspecified: Secondary | ICD-10-CM | POA: Insufficient documentation

## 2018-03-05 DIAGNOSIS — J439 Emphysema, unspecified: Secondary | ICD-10-CM | POA: Insufficient documentation

## 2018-03-05 DIAGNOSIS — Z833 Family history of diabetes mellitus: Secondary | ICD-10-CM | POA: Insufficient documentation

## 2018-03-05 DIAGNOSIS — I255 Ischemic cardiomyopathy: Secondary | ICD-10-CM | POA: Insufficient documentation

## 2018-03-05 LAB — TSH: TSH: 5.028 u[IU]/mL — ABNORMAL HIGH (ref 0.350–4.500)

## 2018-03-05 LAB — BASIC METABOLIC PANEL
Anion gap: 12 (ref 5–15)
BUN: 36 mg/dL — ABNORMAL HIGH (ref 6–20)
CO2: 19 mmol/L — ABNORMAL LOW (ref 22–32)
Calcium: 8.3 mg/dL — ABNORMAL LOW (ref 8.9–10.3)
Chloride: 106 mmol/L (ref 101–111)
Creatinine, Ser: 1.55 mg/dL — ABNORMAL HIGH (ref 0.61–1.24)
GFR calc Af Amer: 45 mL/min — ABNORMAL LOW (ref >60)
GFR calc non Af Amer: 39 mL/min — ABNORMAL LOW (ref >60)
Glucose, Bld: 91 mg/dL (ref 65–99)
Potassium: 4.6 mmol/L (ref 3.5–5.1)
Sodium: 137 mmol/L (ref 135–145)

## 2018-03-05 MED ORDER — ROSUVASTATIN CALCIUM 10 MG PO TABS: 10.0000 mg | ORAL_TABLET | Freq: Every day | ORAL | 3 refills | Status: DC

## 2018-03-05 MED ORDER — FUROSEMIDE 20 MG PO TABS: 20.0000 mg | ORAL_TABLET | Freq: Every day | ORAL | 0 refills | Status: DC

## 2018-03-05 NOTE — Patient Instructions (Signed)
Start Crestor 10 mg (1 tab) daily  Decrease Furosemide 20 mg (1 tab) daily  Your physician has recommended that you have a sleep study. This test records several body functions during sleep, including: brain activity, eye movement, oxygen and carbon dioxide blood levels, heart rate and rhythm, breathing rate and rhythm, the flow of air through your mouth and nose, snoring, body muscle movements, and chest and belly movement.  Labs drawn today (if we do not call you, then your lab work was stable)   Your physician recommends that you return for lab work in: 2 months Rx given   Your physician recommends that you schedule a follow-up appointment in: 3 months with Dr. Aundra Dubin  (we will call you)

## 2018-03-06 NOTE — Progress Notes (Signed)
Advanced Heart Failure Clinic Note   PCP: Dr. Sallyanne Havers [facility]) Cardiology: Dr. Aundra Dubin  84 y.o. male with history of recurrent small bowel obstruction and CAD with ischemic cardiomyopathy presents for followup.  In 9/14, patient was hospitalized with recurrent abdominal pain from small bowel obstruction.  This was managed conservatively with NG tube/NPO and IV fluid.  In the hospital, the patient developed pulmonary edema and actually ended up being intubated.  Troponin was normal. Echo was done showing EF 35-40% with anteroseptal, anterolateral, and apical akinesis along with moderate AI.  He had no prior known cardiac disease.  It was thought that the patient had had an out of hospital MI at some point then developed pulmonary edema with IVF while NPO.  He was diuresed and extubated.  Creatinine was up to about 1.6 in the hospital so no cardiac cath was done (not thought to be urgent given normal creatinine).   I took him for [facility] in 11/14.  This showed 95% mid LAD stenosis that was treated with 2 overlapping Promus DES.  Echo in 2/15 showed EF 30-35% with normal RV.  Coreg was cut back to 6.25 bid due to bradycardia and fatigue.   Had R CEA in 11/16 with Dr Trula Slade. 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 He developed gynecomastia and had to stop spironolactone.  Eplerenone caused nausea/vomiting.    He was admitted in 12/18 with SBO, had ex lap with lysis of adhesions.  He was admitted again in 2/19 with SBO, again with ex lap and lysis of adhesions.    Lasix was stopped due to orthostatic symptoms, and he was admitted in 4/19 with dyspnea, found to have CHF.  He got IV Lasix and was restarted on po Lasix for home.   Echo in 4/19 with EF 30-35%, moderate LVH, mild to moderate AI, moderate MR.    He returns today for followup of CHF and CAD. Grieving over recent death of his wife.  BP is high today but runs systolic [id number] at home.  No significant exertional dyspnea.  No chest pain.  No  orthopnea/PND.  No lightheadedness.  No recurrent abdominal pain.  He stopped atorvastatin due to muscle cramping.    Labs (9/14): K 3.4, creatinine 1.55 => 1.4, LDL 89, HDL 38 Labs (11/14): K 4.9, creatinine 1.6 Labs (1/15): K 4.1, creatinine 1.4 Labs (2/15): LDL 34, HDL 32 Labs (2/16): K 4.5, creatinine 1.5 Labs (8/16): LDL 26, HDL 33 Labs (11/16): K 3.4, creatinine 1.49 Labs (4/17): K 4, creatinine 1.65 Labs (4/18): K 4.4, creatinine 1.4 Labs (5/18): K 4.3, creatinine 1.68, LDL 21, HDL 23 Labs (4/19): K 4.2, creatinine 1.4, LDL 72  PMH: 1. Nephrolithiasis 2. H/o CCY 3. H/o diverticulitis 4. H/o SBO: initially in 1/14 with small bowel resection, again in 9/14 treated conservatively.  Ex laps for lysis of adhesions in 12/18 and 2/19.  5. Ischemic cardiomyopathy: Echo (9/14) with EF 35-40%, anteroseptal/anterolateral/apical akinesis and moderate AI.  Patient had acute pulmonary edema while hospitalized in 9/14. Echo (2/15) with EF 30-35% with wall motion abnormalities, normal RV size and systolic function, mild MR and mild AI.  - Echo (4/19): EF 30-35% with regional wall motion abnormalities, moderate LVH, mild-moderate AI, moderate MR, PASP 59 mmHg.  6. CKD stage 3 7. Aortic insufficiency: mild on last echo 8. CAD: LHC (11/14) with 95% mLAD stenosis treated with 2 overlapping Promus DES.  There was residual 50% stenosis beyond the stent margin.   9. Carotid stenosis: 2/16  carotid dopplers with 80-99% RICA stenosis, 39-76% LICA stenosis. CTA neck showed 90% RICA stenosis. Right CEA 10/16.  10. CML: Diagnosed in 2016, on imatinib.  11. COPD: Emphysema on chest CT.  12. Hyperlipidemia: Myalgias with atorvastatin.   SH: widower (2nd time) with 4 children.  Lives in [place].  Previous heavy smoker.  Retired Chief Financial Officer.   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   ROS: All systems reviewed and negative except as per HPI.   Current Outpatient Medications  Medication Sig Dispense  Refill  . acetaminophen (TYLENOL) 325 MG tablet You can take plain Tylenol 2 tablets every 4 hours for mild pain.  You can take the prescribed pain medicine for pain not relieved by plain Tylenol.  DO NOT TAKE MORE THAN 4000 MG OF TYLENOL PER DAY.  IT CAN HARM YOUR LIVER.  TYLENOL (ACETAMINOPHEN) IS ALSO IN YOUR PRESCRIPTION PAIN MEDICATION.  YOU HAVE TO COUNT IT IN YOUR DAILY TOTAL. (Patient taking differently: Take 650 mg by mouth every 4 (four) hours as needed for mild pain. ) 100 tablet 2  . Ascorbic Acid (VITAMIN C PO) Take 1,000 mg by mouth daily.     Marland Kitchen aspirin EC 81 MG tablet Take 1 tablet (81 mg total) by mouth daily. 90 tablet 3  . BOSULIF 100 MG tablet Call Dr. [name], make an appointment and discuss when to resume this medicine. (Patient taking differently: Take 400 mg by mouth daily with breakfast. Call Dr. [name], make an appointment and discuss when to resume this medicine.)    . calcium carbonate (CALCIUM 600) 600 MG TABS tablet Resume this as before next week when you are eating better. (Patient taking differently: Take 600 mg by mouth daily with breakfast. Resume this as before next week when you are eating better.) 60 tablet   . carvedilol (COREG) 12.5 MG tablet TAKE 1/2 TABLET TWICE DAILY WITH A MEAL 30 tablet 6  . cyanocobalamin 1000 MCG tablet Take 1,000 mcg by mouth daily.    . furosemide (LASIX) 20 MG tablet Take 1 tablet (20 mg total) by mouth daily. 30 tablet 0  . losartan (COZAAR) 25 MG tablet TAKE 1/2 TABLET BY MOUTH DAILY 15 tablet 3  . potassium chloride SA (K-DUR,KLOR-CON) 20 MEQ tablet Take 1 tablet (20 mEq total) by mouth daily. 7 tablet 0  . Thiamine Mononitrate (VITAMIN B1 PO) Take 250 mg by mouth daily.    . rosuvastatin (CRESTOR) 10 MG tablet Take 1 tablet (10 mg total) by mouth daily. 30 tablet 3   No current facility-administered medications for this encounter.     BP (!) 150/60   Pulse 73   Wt 116 lb (52.6 kg)   SpO2 100%   BMI 17.13 kg/m  General:  NAD Neck: No JVD, no thyromegaly or thyroid nodule.  Lungs: Clear to auscultation bilaterally with normal respiratory effort. CV: Nondisplaced PMI.  Heart regular S1/S2, no S3/S4, 1/6 SEM RUSB.  No peripheral edema.  No carotid bruit.  Normal pedal pulses.  Abdomen: Soft, nontender, no hepatosplenomegaly, no distention.  Skin: Intact without lesions or rashes.  Neurologic: Alert and oriented x 3.  Psych: Normal affect. Extremities: No clubbing or cyanosis.  HEENT: Normal.   Assessment/Plan: 1. Chronic systolic CHF: Ischemic cardiomyopathy, EF 30-35% on 4/19 echo. Volume status looks ok.  NYHA class I-II symptoms.  Unable to take spironolactone (gynecomastia) or eplerenone (nausea/vomiting).  BP has not tolerated uptitration of losartan or Coreg (symptomatic hypotension).   - Continue current doses of  Coreg and losartan.  - No ICD given age.  - He can decrease Lasix from 40 mg daily to 20 mg daily.  In the past, he has only used Lasix prn.    2. CAD: S/p DES to mLAD with overlapping Promus stents in 11/14.  Of note, there was residual 50% stenosis just beyond the stent margin.  No ischemic symptoms.  - Continue ASA.     - He stopped atorvastatin due to myalgias.  I will have him start Crestor 10 mg daily.  3. Hyperlipidemia: Myalgias with atorvastatin.  Will switch him to Crestor 10 mg daily, lipids/LFTs in 2 months. . 4. CKD: Stage 3, stable.  5. Carotid stenosis: s/p R CEA 09/2015 with Dr Trula Slade.  Follows at [facility].   Loralie Champagne 03/06/2018

## 2018-03-15 ENCOUNTER — Encounter (HOSPITAL_COMMUNITY): Payer: Self-pay

## 2018-03-15 NOTE — Progress Notes (Signed)
Medical record request from [facility] received and completed on behalf of patient. Faxed to [phone] Copy of request scanned into patient's electronic medical record.  Renee Pain, RN

## 2018-04-09 ENCOUNTER — Ambulatory Visit (HOSPITAL_BASED_OUTPATIENT_CLINIC_OR_DEPARTMENT_OTHER): Payer: Medicare HMO | Attending: Cardiology | Admitting: Cardiology

## 2018-04-09 DIAGNOSIS — G4733 Obstructive sleep apnea (adult) (pediatric): Secondary | ICD-10-CM | POA: Diagnosis not present

## 2018-04-09 DIAGNOSIS — R0683 Snoring: Secondary | ICD-10-CM | POA: Diagnosis present

## 2018-04-10 NOTE — Procedures (Signed)
   Patient Name: [name] [name], [name] [name]  Study Date:11/06/2017 04/09/2018   Gender: Male  D.O.B: Aug 29, 1930  Age (years): 50  Referring Provider: Loralie Champagne  Height (inches): 22  Interpreting Physician: Fransico Him MD, ABSM  Weight (lbs): 116  RPSGT: Laren Everts  BMI: 17  MRN: [id number]  Neck Size: 14.00   CLINICAL INFORMATION  Sleep Study Type: NPSG Indication for sleep study: Congestive Heart Failure, Snoring Epworth Sleepiness Score: 11  SLEEP STUDY TECHNIQUE  As per the AASM Manual for the Scoring of Sleep and Associated Events v2.3 (April 2016) with a hypopnea requiring 4% desaturations. The channels recorded and monitored were frontal, central and occipital EEG, electrooculogram (EOG), submentalis EMG (chin), nasal and oral airflow, thoracic and abdominal wall motion, anterior tibialis EMG, snore microphone, electrocardiogram, and pulse oximetry.  MEDICATIONS  Medications self-administered by patient taken the night of the study : N/A  SLEEP ARCHITECTURE  The study was initiated at 10:29:56 PM and ended at 4:49:11 AM. Sleep onset time was 9.8 minutes and the sleep efficiency was 43.5%%. The total sleep time was 165 minutes. Stage REM latency was 231.5 minutes. The patient spent 44.2%% of the night in stage N1 sleep, 50.3%% in stage N2 sleep, 0.0%% in stage N3 and 5.45% in REM. Alpha intrusion was absent. Supine sleep was 56.97%.  RESPIRATORY PARAMETERS  The overall apnea/hypopnea index (AHI) was 11.3 per hour. There were 0 total apneas, including 0 obstructive, 0 central and 0 mixed apneas. There were 31 hypopneas and 74 RERAs. The AHI during Stage REM sleep was 26.7 per hour. AHI while supine was 18.5 per hour. The mean oxygen saturation was 93.9%. The minimum SpO2 during sleep was 89.0%. soft snoring was noted during this study.  CARDIAC DATA  The 2 lead EKG demonstrated sinus rhythm. The mean heart rate was 53.9 beats per minute. Other EKG findings include: PVCs and  PACs.  LEG MOVEMENT DATA  The total PLMS were 0 with a resulting PLMS index of 0.0. Associated arousal with leg movement index was 0.0 .  IMPRESSIONS  - Mild obstructive sleep apnea occurred during this study (AHI = 11.3/h). - No significant central sleep apnea occurred during this study (CAI = 0.0/h).  - The patient had minimal or no oxygen desaturation during the study (Min O2 = 89.0%)  - The patient snored with soft snoring volume.  - EKG findings include PVCs and PACx. - Clinically significant periodic limb movements did not occur during sleep. No significant associated arousals.  DIAGNOSIS  - Obstructive Sleep Apnea (327.23 [G47.33 ICD-10])  RECOMMENDATIONS  - Therapeutic CPAP titration to determine optimal pressure required to alleviate sleep disordered breathing.  - Positional therapy avoiding supine position during sleep.  - Avoid alcohol, sedatives and other CNS depressants that may worsen sleep apnea and disrupt normal sleep architecture.  - Sleep hygiene should be reviewed to assess factors that may improve sleep quality.  - Weight management and regular exercise should be initiated or continued if appropriate.  [Electronically signed] 04/10/2018 11:19 AM Fransico Him MD, ABSM  Diplomate, American Board of Sleep Medicine

## 2018-04-12 ENCOUNTER — Other Ambulatory Visit (HOSPITAL_COMMUNITY): Payer: Self-pay | Admitting: Cardiology

## 2018-04-17 ENCOUNTER — Telehealth (HOSPITAL_COMMUNITY): Payer: Self-pay | Admitting: *Deleted

## 2018-04-17 DIAGNOSIS — I6523 Occlusion and stenosis of bilateral carotid arteries: Secondary | ICD-10-CM

## 2018-04-17 DIAGNOSIS — I25759 Atherosclerosis of native coronary artery of transplanted heart with unspecified angina pectoris: Secondary | ICD-10-CM

## 2018-04-17 NOTE — Telephone Encounter (Signed)
Would recommend that he go to lipid clinic to start Repatha as it does not appear he can tolerate statins. Repatha will not cause side effects.

## 2018-04-17 NOTE — Telephone Encounter (Signed)
Advanced Heart Failure Triage Encounter  Patient Name: [name] [name]  Date of Call: 04/17/18  Problem:  Patient's daughter called complaining of her joint pain with Crestor.  She also wanted Dr. Aundra Dubin to know that her father is still complaining of severe leg cramps and would also like to come off Crestor.   Plan:  Will forward to Dr. Aundra Dubin to review and will call patient's daughter back with his response.   Darron Doom, RN

## 2018-04-18 ENCOUNTER — Telehealth: Payer: Self-pay | Admitting: *Deleted

## 2018-04-18 ENCOUNTER — Telehealth: Payer: Self-pay

## 2018-04-18 NOTE — Telephone Encounter (Signed)
Spoke with daughter and she is agreeable with plan.  Referral placed for lipid clinic and crestor discontinued.

## 2018-04-18 NOTE — Telephone Encounter (Signed)
Release Of Information mailed to pt home address.

## 2018-04-18 NOTE — Telephone Encounter (Signed)
-----   Message from Sueanne Margarita, MD sent at 04/16/2018  5:53 PM EDT ----- Regarding: RE: results Sorry, yes please order in lab CPAP titration  Fransico Him ----- Message ----- From: Freada Bergeron, CMA Sent: 04/16/2018   5:42 PM To: Sueanne Margarita, MD Subject: results                                        Hello Dr Radford Pax, did you want me to call the patient with this result? It came to me empty but I found it in your progress note.  Therapeutic CPAP titration to determine optimal pressure required to alleviate sleep disordered breathing.  ----- Message ----- From: Sueanne Margarita, MD Sent: 04/10/2018  11:37 AM To: Freada Bergeron, CMA

## 2018-04-18 NOTE — Telephone Encounter (Addendum)
Informed patient of sleep study results and patient understanding was verbalized. Patient is aware Dr Radford Pax has recommended Therapeutic CPAP titration to determine optimal pressure required to alleviate sleep disordered breathing.  Patient has declined to have the CPAP titration at this time as he just lost his wife and is too stressed to test. Patient also ask for a copy of his sleep study which will be mailed to him once he signs the release form our medical records will mail him.

## 2018-04-18 NOTE — Telephone Encounter (Signed)
Called results LMTCB 

## 2018-04-25 ENCOUNTER — Telehealth (HOSPITAL_COMMUNITY): Payer: Self-pay

## 2018-04-25 NOTE — Telephone Encounter (Signed)
Patient's daughter called to talk about patient.  Did not specify any needs. Attempted to return call.  No answer, no VM available.  Renee Pain, RN

## 2018-05-22 ENCOUNTER — Other Ambulatory Visit (HOSPITAL_COMMUNITY): Payer: Self-pay | Admitting: *Deleted

## 2018-05-22 DIAGNOSIS — I25759 Atherosclerosis of native coronary artery of transplanted heart with unspecified angina pectoris: Secondary | ICD-10-CM

## 2018-05-22 DIAGNOSIS — E785 Hyperlipidemia, unspecified: Secondary | ICD-10-CM

## 2018-05-24 NOTE — Addendum Note (Signed)
Addended by: Scarlette Calico on: 05/24/2018 08:30 AM   Modules accepted: Orders

## 2018-06-04 ENCOUNTER — Ambulatory Visit (HOSPITAL_COMMUNITY)
Admission: RE | Admit: 2018-06-04 | Discharge: 2018-06-04 | Disposition: A | Discharge status: Home / Self Care | Payer: Medicare HMO | Source: Ambulatory Visit | Attending: Cardiology | Admitting: Cardiology

## 2018-06-04 VITALS — BP 143/61 | HR 58 | Wt 122.8 lb

## 2018-06-04 DIAGNOSIS — E785 Hyperlipidemia, unspecified: Secondary | ICD-10-CM | POA: Insufficient documentation

## 2018-06-04 DIAGNOSIS — I6529 Occlusion and stenosis of unspecified carotid artery: Secondary | ICD-10-CM | POA: Insufficient documentation

## 2018-06-04 DIAGNOSIS — I5022 Chronic systolic (congestive) heart failure: Secondary | ICD-10-CM | POA: Diagnosis not present

## 2018-06-04 DIAGNOSIS — I2510 Atherosclerotic heart disease of native coronary artery without angina pectoris: Secondary | ICD-10-CM | POA: Insufficient documentation

## 2018-06-04 DIAGNOSIS — Z7982 Long term (current) use of aspirin: Secondary | ICD-10-CM | POA: Diagnosis not present

## 2018-06-04 DIAGNOSIS — N183 Chronic kidney disease, stage 3 (moderate): Secondary | ICD-10-CM | POA: Insufficient documentation

## 2018-06-04 DIAGNOSIS — Z79899 Other long term (current) drug therapy: Secondary | ICD-10-CM | POA: Insufficient documentation

## 2018-06-04 DIAGNOSIS — G4733 Obstructive sleep apnea (adult) (pediatric): Secondary | ICD-10-CM | POA: Insufficient documentation

## 2018-06-04 DIAGNOSIS — M791 Myalgia, unspecified site: Secondary | ICD-10-CM | POA: Diagnosis not present

## 2018-06-04 DIAGNOSIS — J439 Emphysema, unspecified: Secondary | ICD-10-CM | POA: Diagnosis not present

## 2018-06-04 DIAGNOSIS — I255 Ischemic cardiomyopathy: Secondary | ICD-10-CM | POA: Insufficient documentation

## 2018-06-04 DIAGNOSIS — Z955 Presence of coronary angioplasty implant and graft: Secondary | ICD-10-CM | POA: Diagnosis not present

## 2018-06-04 LAB — BASIC METABOLIC PANEL
Anion gap: 10 (ref 5–15)
BUN: 23 mg/dL (ref 8–23)
CO2: 18 mmol/L — ABNORMAL LOW (ref 22–32)
Calcium: 8.2 mg/dL — ABNORMAL LOW (ref 8.9–10.3)
Chloride: 112 mmol/L — ABNORMAL HIGH (ref 98–111)
Creatinine, Ser: 1.62 mg/dL — ABNORMAL HIGH (ref 0.61–1.24)
GFR calc Af Amer: 43 mL/min — ABNORMAL LOW (ref >60)
GFR calc non Af Amer: 37 mL/min — ABNORMAL LOW (ref >60)
Glucose, Bld: 107 mg/dL — ABNORMAL HIGH (ref 70–99)
Potassium: 4 mmol/L (ref 3.5–5.1)
Sodium: 140 mmol/L (ref 135–145)

## 2018-06-04 NOTE — Patient Instructions (Signed)
Routine lab work today. Will notify you of abnormal results   Follow up with Dr.McLean in 6 months **please call our office at [phone] in December to schedule January appointment.**  You have been referred to the lipid clinic they will contact you to schedule appointment.

## 2018-06-04 NOTE — Progress Notes (Signed)
Advanced Heart Failure Clinic Note   PCP: Dr. Sallyanne Havers [facility]) Cardiology: Dr. Aundra Dubin  84 y.o. male with history of recurrent small bowel obstruction and CAD with ischemic cardiomyopathy presents for followup.  In 9/14, patient was hospitalized with recurrent abdominal pain from small bowel obstruction.  This was managed conservatively with NG tube/NPO and IV fluid.  In the hospital, the patient developed pulmonary edema and actually ended up being intubated.  Troponin was normal. Echo was done showing EF 35-40% with anteroseptal, anterolateral, and apical akinesis along with moderate AI.  He had no prior known cardiac disease.  It was thought that the patient had had an out of hospital MI at some point then developed pulmonary edema with IVF while NPO.  He was diuresed and extubated.  Creatinine was up to about 1.6 in the hospital so no cardiac cath was done (not thought to be urgent given normal creatinine).   I took him for [facility] in 11/14.  This showed 95% mid LAD stenosis that was treated with 2 overlapping Promus DES.  Echo in 2/15 showed EF 30-35% with normal RV.  Coreg was cut back to 6.25 bid due to bradycardia and fatigue.   Had R CEA in 11/16 with Dr Trula Slade. 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 He developed gynecomastia and had to stop spironolactone.  Eplerenone caused nausea/vomiting.    He was admitted in 12/18 with SBO, had ex lap with lysis of adhesions.  He was admitted again in 2/19 with SBO, again with ex lap and lysis of adhesions.    Lasix was stopped due to orthostatic symptoms, and he was admitted in 4/19 with dyspnea, found to have CHF.  He got IV Lasix and was restarted on po Lasix for home.   Echo in 4/19 with EF 30-35%, moderate LVH, mild to moderate AI, moderate MR.    He returns today for HF follow up. Last visit lasix was decreased. Overall doing well. Denies SOB, orthopnea, or edema. Able to work in the yard with rests. Had a sleep study with mild OSA, but he does not  want to have a CPAP machine. No CP or dizziness. No longer on crestor. Did not go to lipid clinic because it was too expensive. SBP 120-135 at home. Weights 122-124 lbs at home, but has been trying to gain weight (was as low as 108 lbs). Taking all medications. Limits fluid and salt.    Labs (9/14): K 3.4, creatinine 1.55 => 1.4, LDL 89, HDL 38 Labs (11/14): K 4.9, creatinine 1.6 Labs (1/15): K 4.1, creatinine 1.4 Labs (2/15): LDL 34, HDL 32 Labs (2/16): K 4.5, creatinine 1.5 Labs (8/16): LDL 26, HDL 33 Labs (11/16): K 3.4, creatinine 1.49 Labs (4/17): K 4, creatinine 1.65 Labs (4/18): K 4.4, creatinine 1.4 Labs (5/18): K 4.3, creatinine 1.68, LDL 21, HDL 23 Labs (4/19): K 4.2, creatinine 1.4, LDL 72  PMH: 1. Nephrolithiasis 2. H/o CCY 3. H/o diverticulitis 4. H/o SBO: initially in 1/14 with small bowel resection, again in 9/14 treated conservatively.  Ex laps for lysis of adhesions in 12/18 and 2/19.  5. Ischemic cardiomyopathy: Echo (9/14) with EF 35-40%, anteroseptal/anterolateral/apical akinesis and moderate AI.  Patient had acute pulmonary edema while hospitalized in 9/14. Echo (2/15) with EF 30-35% with wall motion abnormalities, normal RV size and systolic function, mild MR and mild AI.  - Echo (4/19): EF 30-35% with regional wall motion abnormalities, moderate LVH, mild-moderate AI, moderate MR, PASP 59 mmHg.  6. CKD stage  3 7. Aortic insufficiency: mild on last echo 8. CAD: LHC (11/14) with 95% mLAD stenosis treated with 2 overlapping Promus DES.  There was residual 50% stenosis beyond the stent margin.   9. Carotid stenosis: 2/16 carotid dopplers with 80-99% RICA stenosis, 56-38% LICA stenosis. CTA neck showed 90% RICA stenosis. Right CEA 10/16.  10. CML: Diagnosed in 2016, on imatinib.  11. COPD: Emphysema on chest CT.  12. Hyperlipidemia: Myalgias with atorvastatin and Crestor.  13. OSA: Does not want CPAP.   SH: widower (2nd time) with 4 children.  Lives in [place].   Previous heavy smoker.  Retired Chief Financial Officer.   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   Review of systems complete and found to be negative unless listed in HPI.     Current Outpatient Medications  Medication Sig Dispense Refill  . acetaminophen (TYLENOL) 325 MG tablet You can take plain Tylenol 2 tablets every 4 hours for mild pain.  You can take the prescribed pain medicine for pain not relieved by plain Tylenol.  DO NOT TAKE MORE THAN 4000 MG OF TYLENOL PER DAY.  IT CAN HARM YOUR LIVER.  TYLENOL (ACETAMINOPHEN) IS ALSO IN YOUR PRESCRIPTION PAIN MEDICATION.  YOU HAVE TO COUNT IT IN YOUR DAILY TOTAL. (Patient taking differently: Take 650 mg by mouth every 4 (four) hours as needed for mild pain. ) 100 tablet 2  . Ascorbic Acid (VITAMIN C PO) Take 1,000 mg by mouth daily.     Marland Kitchen aspirin EC 81 MG tablet Take 1 tablet (81 mg total) by mouth daily. 90 tablet 3  . BOSULIF 100 MG tablet Call Dr. [name], make an appointment and discuss when to resume this medicine. (Patient taking differently: Take 400 mg by mouth daily with breakfast. Call Dr. [name], make an appointment and discuss when to resume this medicine.)    . calcium carbonate (CALCIUM 600) 600 MG TABS tablet Resume this as before next week when you are eating better. (Patient taking differently: Take 600 mg by mouth daily with breakfast. Resume this as before next week when you are eating better.) 60 tablet   . carvedilol (COREG) 12.5 MG tablet TAKE 1/2 TABLET TWICE DAILY WITH A MEAL 30 tablet 6  . cyanocobalamin 1000 MCG tablet Take 1,000 mcg by mouth daily.    . furosemide (LASIX) 20 MG tablet TAKE ONE (1) TABLET BY MOUTH EACH DAY 30 tablet 6  . losartan (COZAAR) 25 MG tablet TAKE 1/2 TABLET BY MOUTH DAILY 15 tablet 3  . potassium chloride SA (K-DUR,KLOR-CON) 20 MEQ tablet Take 1 tablet (20 mEq total) by mouth daily. 7 tablet 0  . Thiamine Mononitrate (VITAMIN B1 PO) Take 250 mg by mouth daily.     No current facility-administered  medications for this encounter.     BP (!) 143/61   Pulse (!) 58   Wt 122 lb 12.8 oz (55.7 kg)   SpO2 100%   BMI 18.13 kg/m  General: Elderly, Thin. No resp difficulty. HEENT: Normal Neck: Supple. JVP 5-6. Carotids 2+ bilat; no bruits. No thyromegaly or nodule noted. Cor: PMI nondisplaced. RRR, 1/6 SEM RUSB Lungs: CTAB, normal effort. Abdomen: Soft, non-tender, non-distended, no HSM. No bruits or masses. +BS  Extremities: No cyanosis, clubbing, or rash. R and LLE no edema.  Neuro: Alert & orientedx3, cranial nerves grossly intact. moves all 4 extremities w/o difficulty. Affect pleasant  Assessment/Plan: 1. Chronic systolic CHF: Ischemic cardiomyopathy, EF 30-35% on 4/19 echo. Volume status looks ok.  NYHA class I-II  symptoms.  Unable to take spironolactone (gynecomastia) or eplerenone (nausea/vomiting).  BP has not tolerated uptitration of losartan or Coreg (symptomatic hypotension).   - Continue current doses of Coreg 12.5 mg BID and losartan 12.5 mg daily.  - No ICD given age.  - Continue lasix 20 mg daily   2. CAD: S/p DES to mLAD with overlapping Promus stents in 11/14.  Of note, there was residual 50% stenosis just beyond the stent margin.  No ischemic symptoms.  - Continue ASA.     - Myalgias with atorvastatin and crestor. Referred to lipid clinic.  3. Hyperlipidemia: Myalgias with atorvastatin.  Will switch him to Crestor 10 mg daily, lipids/LFTs in 2 months. . 4. CKD: Stage 3, stable.  5. Carotid stenosis: s/p R CEA 09/2015 with Dr Trula Slade.  Follows at [facility].  6. OSA: mild OSA on sleep study. Not interested in CPAP.   Georgiana Shore 06/04/2018  Patient seen with NP, agree with the above note.  Symptomatically, he is stable.  He has had myalgias with both atorvastatin and Crestor and is off both.  He is not volume overloaded on exam.   I will not titrate up his CHF meds as he has not tolerated increases (gets lightheaded).  He had painful gynecomastia with both  spironolactone and eplerenone.   I am going to refer him to the lipid clinic for [place].  He agrees to try this.   BMET today.   Followup in 6 months.   Loralie Champagne 06/05/2018

## 2018-07-03 ENCOUNTER — Ambulatory Visit (INDEPENDENT_AMBULATORY_CARE_PROVIDER_SITE_OTHER): Payer: Medicare HMO | Admitting: Pharmacist

## 2018-07-03 DIAGNOSIS — E782 Mixed hyperlipidemia: Secondary | ICD-10-CM

## 2018-07-03 MED ORDER — EZETIMIBE 10 MG PO TABS: 10.0000 mg | ORAL_TABLET | Freq: Every day | ORAL | 11 refills | Status: DC

## 2018-07-03 NOTE — Progress Notes (Addendum)
[name]                 DOB: 1930/08/19                    MRN: [id number]     HPI: [name] [name] is a 85 y.o. male patient referred to lipid clinic by Dr. Aundra Dubin. PMH is significant for CAD, CHF, unstable angina, ischemic cardiomyopathy, carotid stenosis, HTN, CKD stage III, HLD, sleep apnea. Pt underwent LHC in 09/2013 that showed 95% mid LAD stenosis that was treated with 2 overlapping Promus DES. Patient last seen in clinic on 06/04/18 for CHF follow-up. At that point was referred to lipid clinic. He has had myalgias with atorvastatin and rosuvastatin and has tried every other day dosing of rosuvastatin 5 mg and 10mg  daily. Starting a PCSK9i was discussed at that visit and patient was agreeable to starting [place]. Patient's wife passed earlier this year around March/April at the same time as his most recent hospital admission in April due to CHF exacerbation.   Patient presents to clinic for lipid management with his daughter. He has tried atorvastatin and rosuvastatin in the past and had cramps and itching but mostly the cramps. First tried atorvastatin and then rosuvastatin. Myalgias resolved upon statin discontinuation in each case. He has not tried ezetimibe before and is willing to try that before the PCSK9i. States that he has been having some trouble with his medications after his wife passed earlier this year. He has lost weight over the past year due to less appetite during multiple procedures as well as chemo.  Current Medications: niacin 500mg  daily - OTC Intolerances: atorvastatin 40 mg- myalgias, rosuvastatin 5 mg QOD and 10mg  daily- myalgias, cholestyramine  Risk Factors: CAD s/p DES, HTN, HLD, CHF LDL goal: <70 mg/dL  Diet: mostly eats meals at home. Eats out about 12-14 times a month. Goes out to eat about once a week. Eats bacon, sausage, steak, chicken. Likes to eat potatoes, squash, carrots, and beans. Also eats a lot of fruit. Also enjoys ice cream. Nutritionist  has seen patient and has told him to eat high calorie foods. Drinks water, sweet tea, occasionally coffee. Drinks orange or cranberry juice.   Exercise:  Goes on walk and does house work like Agricultural consultant  Family History: Mother- diabetes, stroke; father- heart disease  Social History: Former smoker (1 ppd, 45 pack year history, quit in 2000), denies alcohol use  Labs: 03/01/18: TC 117, TG 39, HDL 37, LDL 72 (was not on medication for about 2 months) 08/01/17: TC 69, TG 53, HDL 30, LDL 28 (on rosuvastatin 5 mg QOD)  Past Medical History:  Diagnosis Date  . Carotid artery occlusion   . Chronic systolic CHF (congestive heart failure) ([place])    a. 07/2013 Echo: EF 35-40%  . CKD (chronic kidney disease), stage III ([place])    pt. states that he does not have CKD  . CML (chronic myelocytic leukemia) ([place])   . Coronary artery disease    a. 09/2013 Cath: LM min irregs, LAD 95p(2.75x20 Promus Premier DES & 3.0x20 Promus Premier DES overlapping), D1 50-60, LCX 30p, OM1 71m, OM2 60-70p, RCA small, nl.  . Diverticulitis   . Dysphonia 01/08/2017  . History of hiatal hernia   . History of kidney stones   . History of small bowel obstruction   . Hyperlipidemia    denies  . Ischemic cardiomyopathy    a. 07/2013 Echo: EF 35-40%, apical septal,  apical lat, apical AK, mod MR.  . Small bowel perforation ([place])    a. 11/2012 s/p emergent SB resection.  . 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    Current Outpatient Medications on File Prior to Visit  Medication Sig Dispense Refill  . acetaminophen (TYLENOL) 325 MG tablet You can take plain Tylenol 2 tablets every 4 hours for mild pain.  You can take the prescribed pain medicine for pain not relieved by plain Tylenol.  DO NOT TAKE MORE THAN 4000 MG OF TYLENOL PER DAY.  IT CAN HARM YOUR LIVER.  TYLENOL (ACETAMINOPHEN) IS ALSO IN YOUR PRESCRIPTION PAIN MEDICATION.  YOU HAVE TO COUNT IT IN YOUR DAILY TOTAL. (Patient taking differently: Take 650 mg by mouth every 4  (four) hours as needed for mild pain. ) 100 tablet 2  . Ascorbic Acid (VITAMIN C PO) Take 1,000 mg by mouth daily.     Marland Kitchen aspirin EC 81 MG tablet Take 1 tablet (81 mg total) by mouth daily. 90 tablet 3  . BOSULIF 100 MG tablet Call Dr. [name], make an appointment and discuss when to resume this medicine. (Patient taking differently: Take 400 mg by mouth daily with breakfast. Call Dr. [name], make an appointment and discuss when to resume this medicine.)    . calcium carbonate (CALCIUM 600) 600 MG TABS tablet Resume this as before next week when you are eating better. (Patient taking differently: Take 600 mg by mouth daily with breakfast. Resume this as before next week when you are eating better.) 60 tablet   . carvedilol (COREG) 12.5 MG tablet TAKE 1/2 TABLET TWICE DAILY WITH A MEAL 30 tablet 6  . cyanocobalamin 1000 MCG tablet Take 1,000 mcg by mouth daily.    . furosemide (LASIX) 20 MG tablet TAKE ONE (1) TABLET BY MOUTH EACH DAY 30 tablet 6  . losartan (COZAAR) 25 MG tablet TAKE 1/2 TABLET BY MOUTH DAILY 15 tablet 3  . potassium chloride SA (K-DUR,KLOR-CON) 20 MEQ tablet Take 1 tablet (20 mEq total) by mouth daily. 7 tablet 0  . Thiamine Mononitrate (VITAMIN B1 PO) Take 250 mg by mouth daily.     No current facility-administered medications on file prior to visit.     Allergies  Allergen Reactions  . Percocet [Oxycodone-Acetaminophen] Itching  . Metronidazole Other (See Comments)    Insomnia and nervousness    Assessment/Plan:  1. Hyperlipidemia - LDL is slightly above goal < 70 at 72 on no lipid lowering therapy. Discussed rechallenging statin, ezetimibe, and PCSK9i. Patient preferred to try oral therapy before starting an injection. Will start ezetimibe 10 mg daily and stop niacin due to lack of CV benefit. Discussed medication benefits and side effects with patient. Had patient fill out patient assistance forms for PCSK9i in case he does not tolerate ezetimibe. Scheduled follow-up lipid  panel and LFTs in 2 months.  Patient seen with Danella Penton, PharmD Candidate  [facility] E. Lavinia Mcneely, PharmD, BCACP, [place] [address] [place],  [address] Phone: [phone]; Fax: [phone] 07/03/2018 12:03 PM

## 2018-07-03 NOTE — Addendum Note (Signed)
Addended by: SUPPLE, MEGAN E on: 07/03/2018 12:43 PM   Modules accepted: Orders

## 2018-07-03 NOTE — Patient Instructions (Addendum)
It was great seeing you today!  We will send the ezetimibe to [facility]. Contact us if you have any problems with the medication. If you're doing well, we will schedule you for a follow-up lipid panel in about 2 months on 09/02/2018, you can come in anytime after 7:30 am.  Today we discussed starting Repatha or Praluent which is a PCSK9 inhibitor that will help lower you LDL (bad cholesterol). We will contact your insurance and see if they will cover it. We will submit the patient assistance forms to see if you qualify for more coverage. We will contact you after we hear back from your insurance company.   Call us if you have any questions at [phone].

## 2018-08-21 ENCOUNTER — Inpatient Hospital Stay (HOSPITAL_COMMUNITY): Payer: Medicare HMO | Admitting: Anesthesiology

## 2018-08-21 ENCOUNTER — Inpatient Hospital Stay (HOSPITAL_COMMUNITY)
Admission: EM | Admit: 2018-08-21 | Discharge: 2018-08-28 | DRG: 336 | Disposition: A | Discharge status: Home / Self Care | Payer: Medicare HMO | Attending: General Surgery | Admitting: General Surgery

## 2018-08-21 ENCOUNTER — Encounter (HOSPITAL_COMMUNITY): Admission: EM | Disposition: A | Discharge status: Home / Self Care | Payer: Self-pay | Source: Home / Self Care

## 2018-08-21 ENCOUNTER — Other Ambulatory Visit: Payer: Self-pay

## 2018-08-21 ENCOUNTER — Encounter (HOSPITAL_COMMUNITY): Payer: Self-pay | Admitting: Emergency Medicine

## 2018-08-21 ENCOUNTER — Emergency Department (HOSPITAL_COMMUNITY): Payer: Medicare HMO

## 2018-08-21 DIAGNOSIS — J449 Chronic obstructive pulmonary disease, unspecified: Secondary | ICD-10-CM | POA: Diagnosis present

## 2018-08-21 DIAGNOSIS — Z888 Allergy status to other drugs, medicaments and biological substances status: Secondary | ICD-10-CM | POA: Diagnosis not present

## 2018-08-21 DIAGNOSIS — K660 Peritoneal adhesions (postprocedural) (postinfection): Secondary | ICD-10-CM | POA: Diagnosis present

## 2018-08-21 DIAGNOSIS — I6529 Occlusion and stenosis of unspecified carotid artery: Secondary | ICD-10-CM | POA: Diagnosis present

## 2018-08-21 DIAGNOSIS — D630 Anemia in neoplastic disease: Secondary | ICD-10-CM | POA: Diagnosis present

## 2018-08-21 DIAGNOSIS — K562 Volvulus: Secondary | ICD-10-CM | POA: Diagnosis present

## 2018-08-21 DIAGNOSIS — K567 Ileus, unspecified: Secondary | ICD-10-CM | POA: Diagnosis present

## 2018-08-21 DIAGNOSIS — E870 Hyperosmolality and hypernatremia: Secondary | ICD-10-CM | POA: Diagnosis not present

## 2018-08-21 DIAGNOSIS — I2510 Atherosclerotic heart disease of native coronary artery without angina pectoris: Secondary | ICD-10-CM | POA: Diagnosis present

## 2018-08-21 DIAGNOSIS — I272 Pulmonary hypertension, unspecified: Secondary | ICD-10-CM | POA: Diagnosis present

## 2018-08-21 DIAGNOSIS — E785 Hyperlipidemia, unspecified: Secondary | ICD-10-CM | POA: Diagnosis present

## 2018-08-21 DIAGNOSIS — C9210 Chronic myeloid leukemia, BCR/ABL-positive, not having achieved remission: Secondary | ICD-10-CM | POA: Diagnosis present

## 2018-08-21 DIAGNOSIS — R188 Other ascites: Secondary | ICD-10-CM | POA: Diagnosis present

## 2018-08-21 DIAGNOSIS — N183 Chronic kidney disease, stage 3 unspecified: Secondary | ICD-10-CM | POA: Diagnosis present

## 2018-08-21 DIAGNOSIS — Z7982 Long term (current) use of aspirin: Secondary | ICD-10-CM | POA: Diagnosis not present

## 2018-08-21 DIAGNOSIS — Z885 Allergy status to narcotic agent status: Secondary | ICD-10-CM | POA: Diagnosis not present

## 2018-08-21 DIAGNOSIS — Z87442 Personal history of urinary calculi: Secondary | ICD-10-CM

## 2018-08-21 DIAGNOSIS — Q433 Congenital malformations of intestinal fixation: Secondary | ICD-10-CM

## 2018-08-21 DIAGNOSIS — I493 Ventricular premature depolarization: Secondary | ICD-10-CM | POA: Diagnosis present

## 2018-08-21 DIAGNOSIS — E875 Hyperkalemia: Secondary | ICD-10-CM | POA: Diagnosis not present

## 2018-08-21 DIAGNOSIS — Z79899 Other long term (current) drug therapy: Secondary | ICD-10-CM

## 2018-08-21 DIAGNOSIS — Z955 Presence of coronary angioplasty implant and graft: Secondary | ICD-10-CM | POA: Diagnosis not present

## 2018-08-21 DIAGNOSIS — E162 Hypoglycemia, unspecified: Secondary | ICD-10-CM | POA: Diagnosis not present

## 2018-08-21 DIAGNOSIS — Z87891 Personal history of nicotine dependence: Secondary | ICD-10-CM

## 2018-08-21 DIAGNOSIS — Z8249 Family history of ischemic heart disease and other diseases of the circulatory system: Secondary | ICD-10-CM

## 2018-08-21 DIAGNOSIS — I5022 Chronic systolic (congestive) heart failure: Secondary | ICD-10-CM | POA: Diagnosis present

## 2018-08-21 DIAGNOSIS — I255 Ischemic cardiomyopathy: Secondary | ICD-10-CM | POA: Diagnosis present

## 2018-08-21 DIAGNOSIS — I10 Essential (primary) hypertension: Secondary | ICD-10-CM | POA: Diagnosis not present

## 2018-08-21 DIAGNOSIS — Z9889 Other specified postprocedural states: Secondary | ICD-10-CM

## 2018-08-21 HISTORY — PX: LAPAROTOMY: SHX154

## 2018-08-21 LAB — URINALYSIS, ROUTINE W REFLEX MICROSCOPIC
Bacteria, UA: NONE SEEN
Bilirubin Urine: NEGATIVE
Glucose, UA: NEGATIVE mg/dL
Hgb urine dipstick: NEGATIVE
Ketones, ur: NEGATIVE mg/dL
Leukocytes, UA: NEGATIVE
Nitrite: NEGATIVE
Protein, ur: 30 mg/dL — AB
Specific Gravity, Urine: 1.015 (ref 1.005–1.030)
pH: 5 (ref 5.0–8.0)

## 2018-08-21 LAB — COMPREHENSIVE METABOLIC PANEL
ALT: 16 U/L (ref 0–44)
AST: 25 U/L (ref 15–41)
Albumin: 4 g/dL (ref 3.5–5.0)
Alkaline Phosphatase: 102 U/L (ref 38–126)
Anion gap: 12 (ref 5–15)
BUN: 24 mg/dL — ABNORMAL HIGH (ref 8–23)
CO2: 21 mmol/L — ABNORMAL LOW (ref 22–32)
Calcium: 6.1 mg/dL — CL (ref 8.9–10.3)
Chloride: 109 mmol/L (ref 98–111)
Creatinine, Ser: 1.67 mg/dL — ABNORMAL HIGH (ref 0.61–1.24)
GFR calc Af Amer: 41 mL/min — ABNORMAL LOW (ref >60)
GFR calc non Af Amer: 36 mL/min — ABNORMAL LOW (ref >60)
Glucose, Bld: 102 mg/dL — ABNORMAL HIGH (ref 70–99)
Potassium: 3.6 mmol/L (ref 3.5–5.1)
Sodium: 142 mmol/L (ref 135–145)
Total Bilirubin: 0.7 mg/dL (ref 0.3–1.2)
Total Protein: 7.4 g/dL (ref 6.5–8.1)

## 2018-08-21 LAB — CBC WITH DIFFERENTIAL/PLATELET
Basophils Absolute: 0 10*3/uL (ref 0.0–0.1)
Basophils Relative: 0 %
Eosinophils Absolute: 0.3 10*3/uL (ref 0.0–0.7)
Eosinophils Relative: 4 %
HCT: 37.9 % — ABNORMAL LOW (ref 39.0–52.0)
Hemoglobin: 12.2 g/dL — ABNORMAL LOW (ref 13.0–17.0)
Lymphocytes Relative: 13 %
Lymphs Abs: 1.1 10*3/uL (ref 0.7–4.0)
MCH: 29.2 pg (ref 26.0–34.0)
MCHC: 32.2 g/dL (ref 30.0–36.0)
MCV: 90.7 fL (ref 78.0–100.0)
Monocytes Absolute: 0.8 10*3/uL (ref 0.1–1.0)
Monocytes Relative: 10 %
Neutro Abs: 5.9 10*3/uL (ref 1.7–7.7)
Neutrophils Relative %: 73 %
Platelets: 185 10*3/uL (ref 150–400)
RBC: 4.18 MIL/uL — ABNORMAL LOW (ref 4.22–5.81)
RDW: 17.2 % — ABNORMAL HIGH (ref 11.5–15.5)
WBC: 8.2 10*3/uL (ref 4.0–10.5)

## 2018-08-21 LAB — PROTIME-INR
INR: 1.04
Prothrombin Time: 13.5 seconds (ref 11.4–15.2)

## 2018-08-21 LAB — APTT: aPTT: 34 seconds (ref 24–36)

## 2018-08-21 LAB — TYPE AND SCREEN
ABO/RH(D): O POS
Antibody Screen: NEGATIVE

## 2018-08-21 LAB — MAGNESIUM: Magnesium: 1.3 mg/dL — ABNORMAL LOW (ref 1.7–2.4)

## 2018-08-21 LAB — BRAIN NATRIURETIC PEPTIDE: B Natriuretic Peptide: 366.3 pg/mL — ABNORMAL HIGH (ref 0.0–100.0)

## 2018-08-21 LAB — LIPASE, BLOOD: Lipase: 27 U/L (ref 11–51)

## 2018-08-21 IMAGING — CT CT ABD-PELV W/O CM
2 of 4 series · 15 of 46 positions shown, 17 images · non-contrast
Comparison: CT [DATE]

CLINICAL DATA: Nausea and vomiting.  Lower abdominal pain.

EXAM:
CT ABDOMEN AND PELVIS WITHOUT CONTRAST
TECHNIQUE: Multidetector CT imaging of the abdomen and pelvis was performed
following the standard protocol without IV contrast.

[Series 2: axial st · axial · 0.70mm/px · z∈[+1037,+1417]mm · 12 of 85 slices shown, 14 images]
[im 5/85  soft-tissue]
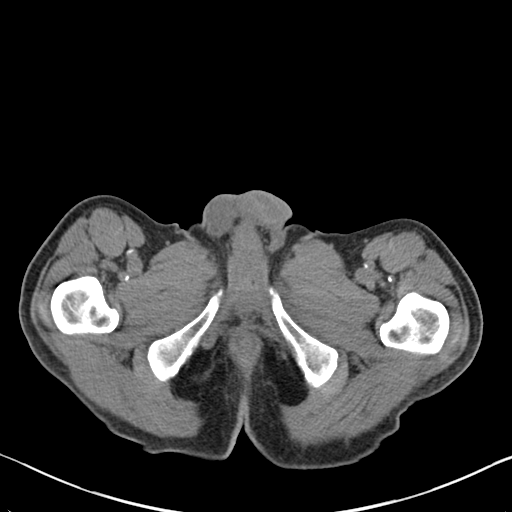
[im 5/85  bone]
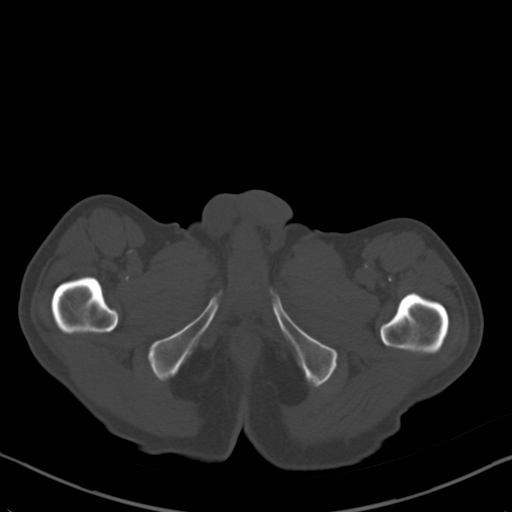
[im 13/85  soft-tissue]
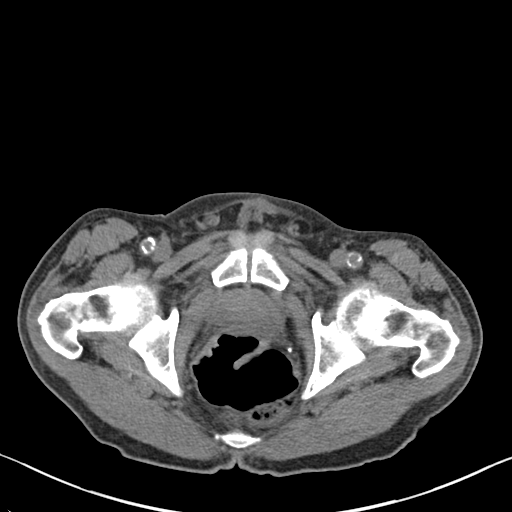
[im 21/85  soft-tissue]
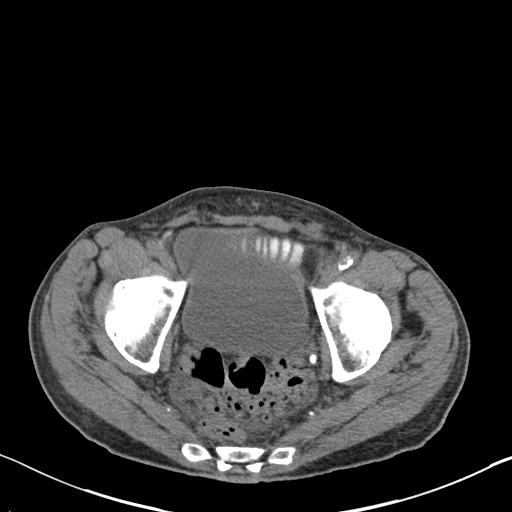
[im 25/85  soft-tissue]
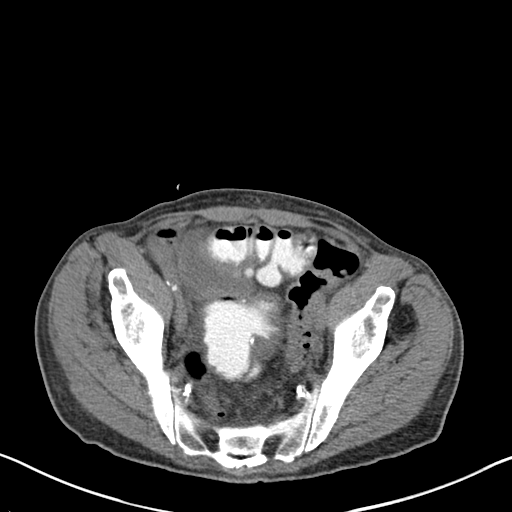
[im 33/85  soft-tissue]
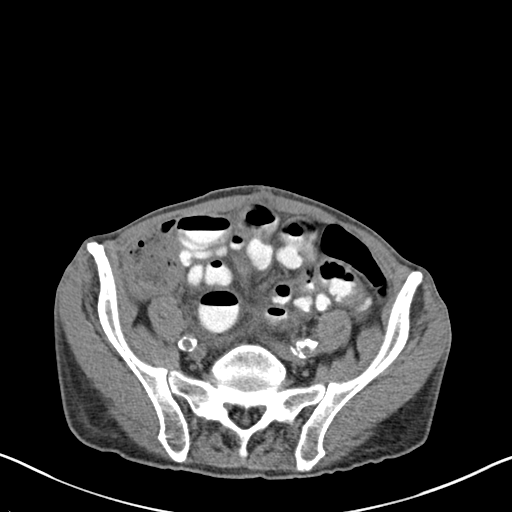
[im 41/85  soft-tissue]
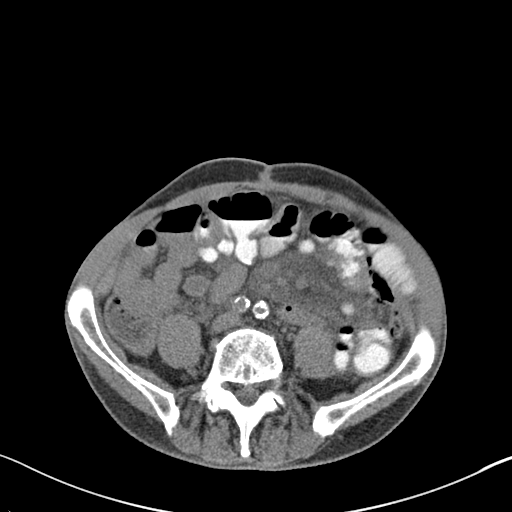
[im 45/85  soft-tissue]
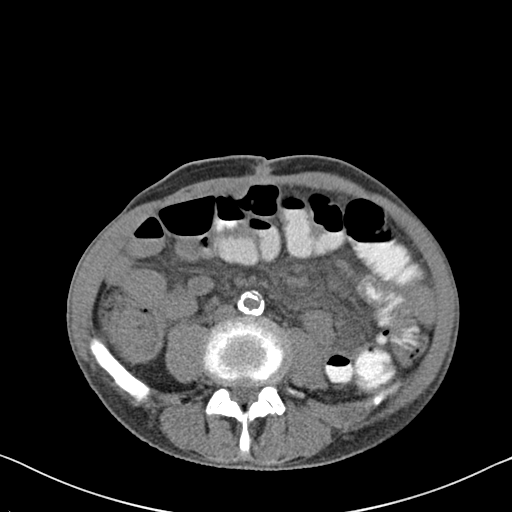
[im 53/85  soft-tissue]
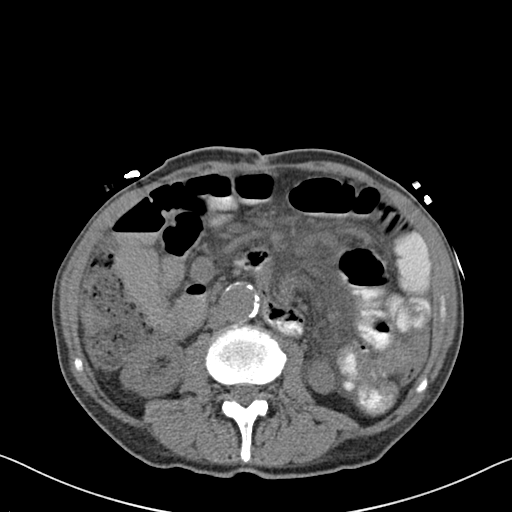
[im 61/85  soft-tissue]
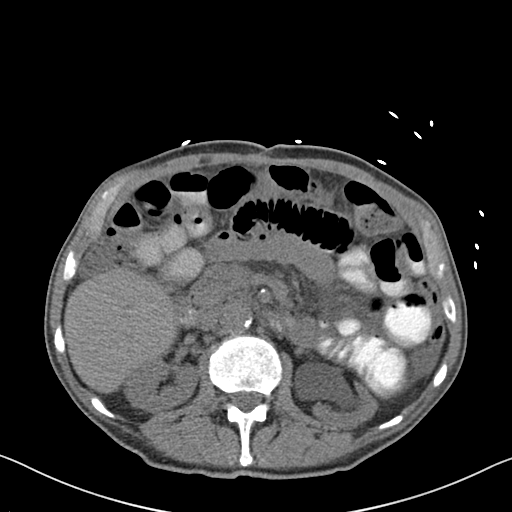
[im 61/85  bone]
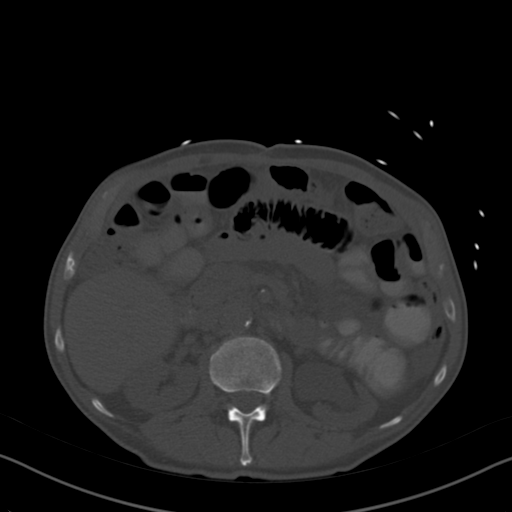
[im 65/85  soft-tissue]
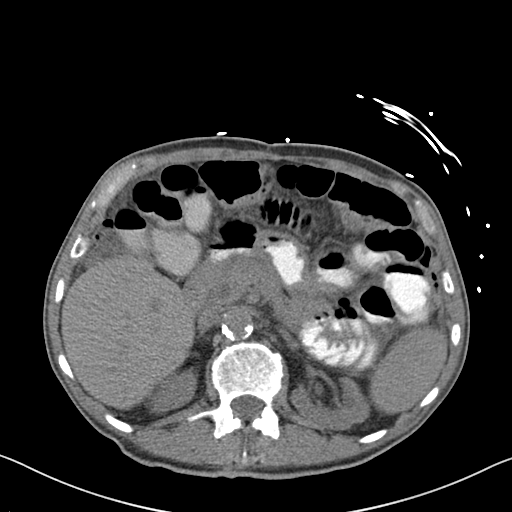
[im 73/85  soft-tissue]
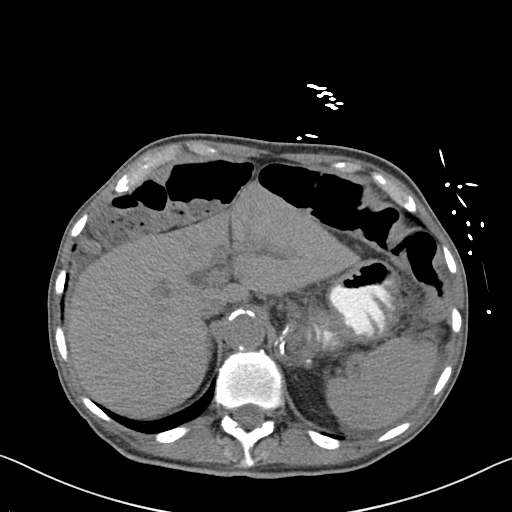
[im 81/85  soft-tissue]
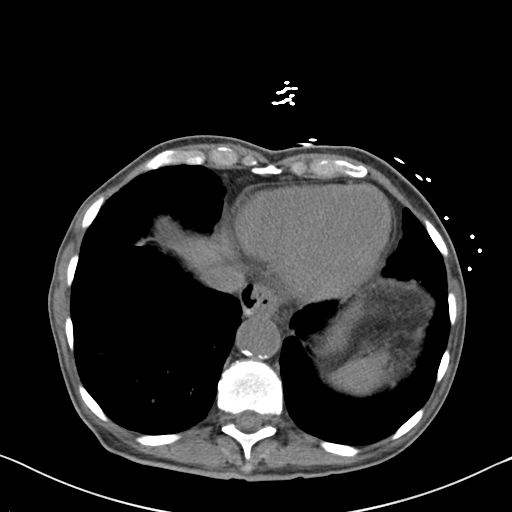

[Series 5: coronal st · coronal · 0.59mm/px · 3 of 80 slices shown]
[im 27/80  soft-tissue]
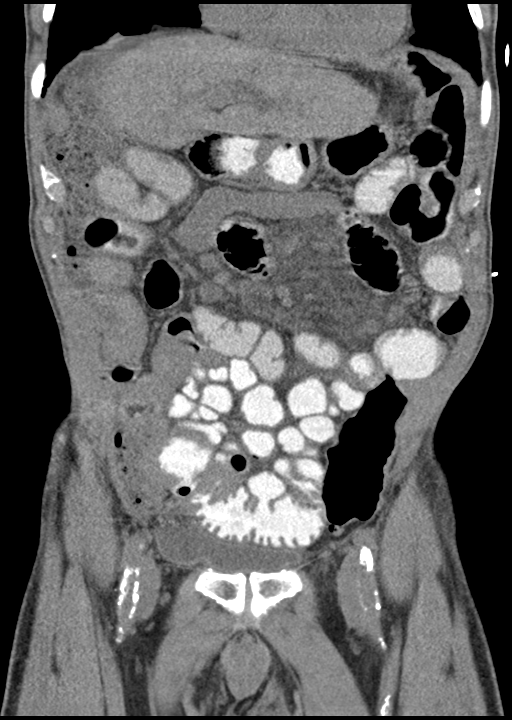
[im 36/80  soft-tissue]
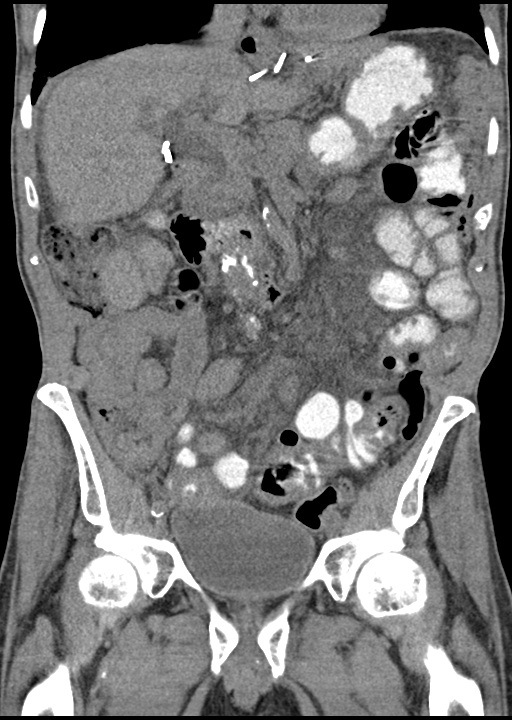
[im 44/80  soft-tissue]
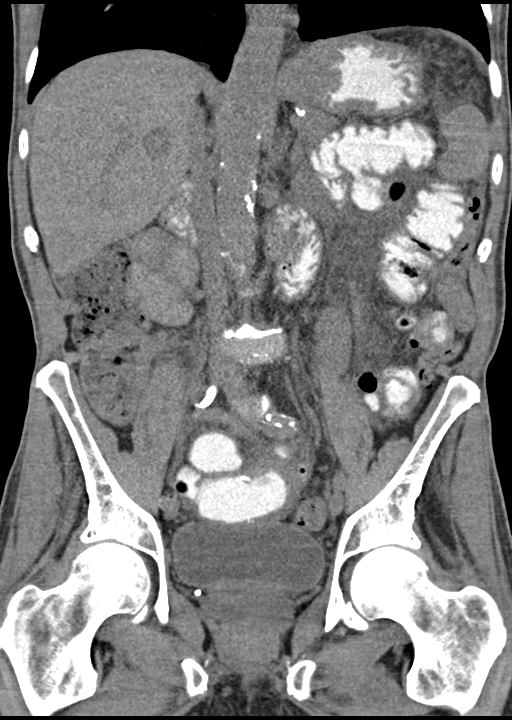

[15 of 46 positions shown; findings below may reference images not displayed]

FINDINGS: Lower chest: Subpleural reticulation in both lung bases with
emphysema. No confluent consolidation. Small hiatal hernia.

Hepatobiliary: No focal hepatic abnormality on noncontrast exam.
Postcholecystectomy with grossly unchanged biliary dilatation
allowing for lack contrast on the current exam.

Pancreas: No ductal dilatation or inflammation.

Spleen: Normal in size without focal abnormality.

Adrenals/Urinary Tract: No adrenal nodule. Chronic left UPJ
obstruction configuration of the kidney, unchanged from prior.
Bilateral nonobstructing renal stones. No hydronephrosis. No
perinephric edema. Urinary bladder is physiologically distended
without wall thickening.

Stomach/Bowel: Surgical clips at the gastroesophageal junction. Mild
gastric wall thickening about the cardia. No abnormal gastric
distension. Again seen mesenteric swirling in the left abdomen,
similar in appearance to prior exam. Scattered dilatation of small
bowel in the upper abdomen. Enteric suture involving small bowel in
the pelvis with small associated wall thickening. Colonic
interposition under the right hemidiaphragm. No colonic dilatation
or inflammatory change. Sigmoid diverticulosis without
diverticulitis.

Vascular/Lymphatic: Prominent mesenteric nodes again seen, not well
assessed in the absence of IV contrast. Aortic and branch
atherosclerosis. Ectatic infrarenal aorta maximal dimension 2.9 cm.

Reproductive: Prominent prostate gland.

Other: Small volume mesenteric ascites. Small volume of ascites in
the upper abdomen. No free air.

Musculoskeletal: There are no acute or suspicious osseous
abnormalities.
IMPRESSION: 1. Small bowel mesenteric swirling concerning for recurrent midgut
volvulus, similar in appearance to prior exam. Small volume
mesenteric and abdominal ascites.
2. Bilateral nonobstructing nephrolithiasis. Chronic UPJ obstruction
appearance of the left kidney.
3. Additional chronic findings include colonic diverticulosis, small
hiatal hernia, and Aortic Atherosclerosis ([D2]-[D2]). Stable
infrarenal aortic ectasia.

These results were called by telephone at the time of interpretation
on [DATE] at [DATE] to Dr. SONIYA , who verbally
acknowledged these results.

## 2018-08-21 SURGERY — LAPAROTOMY, EXPLORATORY
Anesthesia: General

## 2018-08-21 MED ORDER — FENTANYL CITRATE (PF) 100 MCG/2ML IJ SOLN: 25.0000 ug | INTRAMUSCULAR | Status: DC | PRN

## 2018-08-21 MED ORDER — MAGNESIUM SULFATE IN D5W 1-5 GM/100ML-% IV SOLN
1.0000 g | Freq: Once | INTRAVENOUS | Status: AC
  Administered 2018-08-21: 1 g via INTRAVENOUS
  Filled 2018-08-21: qty 100

## 2018-08-21 MED ORDER — DIPHENHYDRAMINE HCL 50 MG/ML IJ SOLN: 12.5000 mg | Freq: Four times a day (QID) | INTRAMUSCULAR | Status: DC | PRN

## 2018-08-21 MED ORDER — FENTANYL CITRATE (PF) 100 MCG/2ML IJ SOLN
25.0000 ug | INTRAMUSCULAR | Status: DC | PRN
  Administered 2018-08-21 – 2018-08-22 (×5): 25 ug via INTRAVENOUS
  Filled 2018-08-21 (×5): qty 2

## 2018-08-21 MED ORDER — IOHEXOL 300 MG/ML  SOLN
30.0000 mL | Freq: Once | INTRAMUSCULAR | Status: AC | PRN
  Administered 2018-08-21: 30 mL via INTRAVENOUS

## 2018-08-21 MED ORDER — PROPOFOL 10 MG/ML IV BOLUS
INTRAVENOUS | Status: DC | PRN
  Administered 2018-08-21: 80 mg via INTRAVENOUS

## 2018-08-21 MED ORDER — FENTANYL CITRATE (PF) 100 MCG/2ML IJ SOLN
INTRAMUSCULAR | Status: DC | PRN
  Administered 2018-08-21 (×3): 50 ug via INTRAVENOUS

## 2018-08-21 MED ORDER — DEXAMETHASONE SODIUM PHOSPHATE 10 MG/ML IJ SOLN
INTRAMUSCULAR | Status: DC | PRN
  Administered 2018-08-21: 5 mg via INTRAVENOUS

## 2018-08-21 MED ORDER — ROCURONIUM BROMIDE 10 MG/ML (PF) SYRINGE
PREFILLED_SYRINGE | INTRAVENOUS | Status: DC | PRN
  Administered 2018-08-21: 10 mg via INTRAVENOUS
  Administered 2018-08-21: 40 mg via INTRAVENOUS

## 2018-08-21 MED ORDER — PIPERACILLIN-TAZOBACTAM 3.375 G IVPB 30 MIN
3.3750 g | Freq: Once | INTRAVENOUS | Status: AC
  Administered 2018-08-21: 3.375 g via INTRAVENOUS
  Filled 2018-08-21: qty 50

## 2018-08-21 MED ORDER — ONDANSETRON HCL 4 MG/2ML IJ SOLN
INTRAMUSCULAR | Status: DC | PRN
  Administered 2018-08-21: 4 mg via INTRAVENOUS

## 2018-08-21 MED ORDER — 0.9 % SODIUM CHLORIDE (POUR BTL) OPTIME
TOPICAL | Status: DC | PRN
  Administered 2018-08-21: 1000 mL

## 2018-08-21 MED ORDER — SUGAMMADEX SODIUM 200 MG/2ML IV SOLN
INTRAVENOUS | Status: DC | PRN
  Administered 2018-08-21: 125 mg via INTRAVENOUS

## 2018-08-21 MED ORDER — HYDROMORPHONE HCL 1 MG/ML IJ SOLN
0.2500 mg | INTRAMUSCULAR | Status: DC | PRN
  Administered 2018-08-21: 0.5 mg via INTRAVENOUS
  Administered 2018-08-21 (×2): 0.25 mg via INTRAVENOUS
  Administered 2018-08-21 (×2): 0.5 mg via INTRAVENOUS

## 2018-08-21 MED ORDER — ONDANSETRON HCL 4 MG/2ML IJ SOLN
4.0000 mg | Freq: Four times a day (QID) | INTRAMUSCULAR | Status: DC | PRN
  Administered 2018-08-21: 4 mg via INTRAVENOUS
  Filled 2018-08-21: qty 2

## 2018-08-21 MED ORDER — PROPOFOL 10 MG/ML IV BOLUS
INTRAVENOUS | Status: AC
  Filled 2018-08-21: qty 20

## 2018-08-21 MED ORDER — FENTANYL CITRATE (PF) 100 MCG/2ML IJ SOLN
100.0000 ug | Freq: Once | INTRAMUSCULAR | Status: AC
  Administered 2018-08-21: 100 ug via INTRAVENOUS
  Filled 2018-08-21: qty 2

## 2018-08-21 MED ORDER — METOPROLOL TARTRATE 5 MG/5ML IV SOLN
2.5000 mg | Freq: Four times a day (QID) | INTRAVENOUS | Status: DC
  Administered 2018-08-21: 2.5 mg via INTRAVENOUS
  Filled 2018-08-21 (×4): qty 5

## 2018-08-21 MED ORDER — MEPERIDINE HCL 50 MG/ML IJ SOLN: 6.2500 mg | INTRAMUSCULAR | Status: DC | PRN

## 2018-08-21 MED ORDER — LACTATED RINGERS IV SOLN
INTRAVENOUS | Status: DC
  Administered 2018-08-21: 09:00:00 via INTRAVENOUS

## 2018-08-21 MED ORDER — SODIUM CHLORIDE 0.9 % IV SOLN
2.0000 g | INTRAVENOUS | Status: AC
  Administered 2018-08-21: 2 g via INTRAVENOUS
  Filled 2018-08-21: qty 2

## 2018-08-21 MED ORDER — MENTHOL 3 MG MT LOZG: 1.0000 | LOZENGE | OROMUCOSAL | Status: DC | PRN

## 2018-08-21 MED ORDER — POTASSIUM CHLORIDE IN NACL 20-0.9 MEQ/L-% IV SOLN
INTRAVENOUS | Status: DC
  Administered 2018-08-21: 15:00:00 via INTRAVENOUS
  Administered 2018-08-22: 1000 mL via INTRAVENOUS
  Administered 2018-08-22 – 2018-08-24 (×4): via INTRAVENOUS
  Filled 2018-08-21 (×8): qty 1000

## 2018-08-21 MED ORDER — HYDROMORPHONE HCL 1 MG/ML IJ SOLN
INTRAMUSCULAR | Status: AC
  Administered 2018-08-21: 0.25 mg via INTRAVENOUS
  Filled 2018-08-21: qty 1

## 2018-08-21 MED ORDER — LIDOCAINE 2% (20 MG/ML) 5 ML SYRINGE
INTRAMUSCULAR | Status: DC | PRN
  Administered 2018-08-21: 60 mg via INTRAVENOUS

## 2018-08-21 MED ORDER — ONDANSETRON 4 MG PO TBDP: 4.0000 mg | ORAL_TABLET | Freq: Four times a day (QID) | ORAL | Status: DC | PRN

## 2018-08-21 MED ORDER — HYDROMORPHONE HCL 1 MG/ML IJ SOLN
INTRAMUSCULAR | Status: AC
  Administered 2018-08-21: 0.5 mg via INTRAVENOUS
  Filled 2018-08-21: qty 1

## 2018-08-21 MED ORDER — SODIUM CHLORIDE 0.9 % IV SOLN
1.0000 g | Freq: Once | INTRAVENOUS | Status: AC
  Administered 2018-08-21: 1 g via INTRAVENOUS
  Filled 2018-08-21: qty 10

## 2018-08-21 MED ORDER — DIPHENHYDRAMINE HCL 12.5 MG/5ML PO ELIX: 12.5000 mg | ORAL_SOLUTION | Freq: Four times a day (QID) | ORAL | Status: DC | PRN

## 2018-08-21 MED ORDER — SUCCINYLCHOLINE CHLORIDE 20 MG/ML IJ SOLN
INTRAMUSCULAR | Status: DC | PRN
  Administered 2018-08-21: 100 mg via INTRAVENOUS

## 2018-08-21 MED ORDER — FENTANYL CITRATE (PF) 250 MCG/5ML IJ SOLN
INTRAMUSCULAR | Status: AC
  Filled 2018-08-21: qty 5

## 2018-08-21 MED ORDER — EPHEDRINE SULFATE-NACL 50-0.9 MG/10ML-% IV SOSY
PREFILLED_SYRINGE | INTRAVENOUS | Status: DC | PRN
  Administered 2018-08-21 (×2): 10 mg via INTRAVENOUS

## 2018-08-21 SURGICAL SUPPLY — 33 items
APPLICATOR COTTON TIP 6 STRL (MISCELLANEOUS) ×1 IMPLANT
APPLICATOR COTTON TIP 6IN STRL (MISCELLANEOUS) ×2
BLADE EXTENDED COATED 6.5IN (ELECTRODE) IMPLANT
BLADE HEX COATED 2.75 (ELECTRODE) ×2 IMPLANT
COVER MAYO STAND STRL (DRAPES) IMPLANT
COVER SURGICAL LIGHT HANDLE (MISCELLANEOUS) ×2 IMPLANT
DRAPE LAPAROSCOPIC ABDOMINAL (DRAPES) ×2 IMPLANT
DRAPE WARM FLUID 44X44 (DRAPE) IMPLANT
DRSG OPSITE POSTOP 4X10 (GAUZE/BANDAGES/DRESSINGS) ×2 IMPLANT
ELECT REM PT RETURN 15FT ADLT (MISCELLANEOUS) ×2 IMPLANT
GAUZE SPONGE 4X4 12PLY STRL (GAUZE/BANDAGES/DRESSINGS) ×2 IMPLANT
GLOVE BIOGEL PI IND STRL 7.0 (GLOVE) ×1 IMPLANT
GLOVE BIOGEL PI INDICATOR 7.0 (GLOVE) ×1
GOWN STRL REUS W/TWL LRG LVL3 (GOWN DISPOSABLE) ×2 IMPLANT
GOWN STRL REUS W/TWL XL LVL3 (GOWN DISPOSABLE) ×4 IMPLANT
HANDLE SUCTION POOLE (INSTRUMENTS) IMPLANT
KIT BASIN OR (CUSTOM PROCEDURE TRAY) ×2 IMPLANT
NS IRRIG 1000ML POUR BTL (IV SOLUTION) ×2 IMPLANT
PACK GENERAL/GYN (CUSTOM PROCEDURE TRAY) ×2 IMPLANT
SPONGE LAP 18X18 RF (DISPOSABLE) IMPLANT
STAPLER VISISTAT 35W (STAPLE) ×4 IMPLANT
SUCTION POOLE HANDLE (INSTRUMENTS)
SUT PDS AB 1 CTX 36 (SUTURE) IMPLANT
SUT PDS AB 1 TP1 96 (SUTURE) ×4 IMPLANT
SUT SILK 2 0 (SUTURE)
SUT SILK 2 0 SH CR/8 (SUTURE) IMPLANT
SUT SILK 2-0 18XBRD TIE 12 (SUTURE) IMPLANT
SUT SILK 3 0 (SUTURE)
SUT SILK 3 0 SH CR/8 (SUTURE) IMPLANT
SUT SILK 3-0 18XBRD TIE 12 (SUTURE) IMPLANT
TOWEL OR 17X26 10 PK STRL BLUE (TOWEL DISPOSABLE) ×4 IMPLANT
TRAY FOLEY MTR SLVR 16FR STAT (SET/KITS/TRAYS/PACK) IMPLANT
YANKAUER SUCT BULB TIP NO VENT (SUCTIONS) IMPLANT

## 2018-08-21 NOTE — ED Triage Notes (Signed)
Pt from home with c/o generalized lower abdominal pain. Pt has hx of bowel obstruction, perforation, and multiple abdominal surgeries. Pt has hx of leukemia. Pt had 1 episode of emesis on the way to the ED tonight. Pt reported LBM was this morning and denies pain, mucous, or bleeding with this. Hyperactive bowel sounds in all quadrants

## 2018-08-21 NOTE — H&P (Signed)
[name] [name] is an 84 y.o. male.   Chief Complaint: abdominal pain   HPI: Patient is an 84 year old male well-known to our service.  He was first admitted in September 2014, with small bowel obstruction with prior history of cholecystectomy, small bowel repair, hiatal hernia repair, and inguinal hernia repair.  We saw him again on 10/14/17 at which time he presented with a small bowel midgut volvulus.  He underwent exploratory laparotomy for small bowel obstruction and cecopexy by Dr. Excell Seltzer.  He did well with this was discharged 5 days later.  He returned on 11/10/2017, again with a small bowel volvulus.  He underwent exploratory laparotomy and reduction of small bowel volvulus by Dr. Rolm Bookbinder.  He was discharged and returned again on 01/10/2018, with recurrent midgut volvulus.  He underwent exploratory laparotomy lysis of adhesions, decompression of the midgut volvulus by Dr. Autumn Messing.  Patient reports doing well, except for some gas and abdominal distention at home over the last 6 months.  His family reports both abdominal distention, belching, and increased flatus.  Yesterday around 2 PM he started having abdominal pain.  This became progressively worse and feels exactly like his last 3 episodes of small bowel volvulus.  He presented to the ED around 1:54 AM with abdominal pain, some nausea, and one episode of emesis.  Work-up in the ED shows he is afebrile his vital signs of been stable. Labs show electrolytes are normal glucose 102, BUN 24, creatinine 1.67.  LFTs are normal.  WBC is 8.2, hemoglobin 12.2, hematocrit 37.9, platelets 185,000.   CT scan shows small bowel mesenteric swirling concerning for recurrent midgut volvulus similar in appearance to his prior exams.  There is a small volume of abdominal and mesenteric ascites.  Bilateral nonobstructing nephrolithiasis, chronic UPJ obstruction appearance of the kidney.  Additional chronic findings, including diverticulosis small  hiatal hernia and significant aortic atherosclerosis.  We are asked to see.  Additional medical problems include coronary artery disease with prior LAD stent, history of systolic congestive heart failure, chronic kidney disease stage III.  COPD/emphysema, chronic myelocytic leukemia, on oral chemotherapy daily. Carotid artery stenosis with prior right carotid endarterectomy.     Past Medical History:  Diagnosis Date  . Carotid artery occlusion   . Chronic systolic CHF (congestive heart failure) ([place])    a. 07/2013 Echo: EF 35-40%  . CKD (chronic kidney disease), stage III ([place])    pt. states that he does not have CKD  . CML (chronic myelocytic leukemia) ([place])   . Coronary artery disease    a. 09/2013 Cath: LM min irregs, LAD 95p(2.75x20 Promus Premier DES & 3.0x20 Promus Premier DES overlapping), D1 50-60, LCX 30p, OM1 29m OM2 60-70p, RCA small, nl.  . Diverticulitis   . Dysphonia 01/08/2017  . History of hiatal hernia   . History of kidney stones   . History of small bowel obstruction   . Hyperlipidemia    denies  . Ischemic cardiomyopathy    a. 07/2013 Echo: EF 35-40%, apical septal, apical lat, apical AK, mod MR.  . Small bowel perforation ([place]    a. 11/2012 s/p emergent SB resection.  . 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 BOWEL RESECTION     3cm sm intestine  . CARDIAC CATHETERIZATION  10/09/2013  . CAROTID ENDARTERECTOMY    . CHOLECYSTECTOMY    . COLON SURGERY  11/2012   BOWEL SURGERY   . CORONARY ANGIOPLASTY  10/09/2013   MID LAD  . ENDARTERECTOMY Right 09/17/2015   Procedure: Right Carotid ENDARTERECTOMY with Xenosure Patch Angioplasty;  Surgeon: Serafina Mitchell, MD;  Location: [facility];  Service: Vascular;  Laterality: Right;  . EYE SURGERY Bilateral   . INGUINAL HERNIA REPAIR Bilateral   . LAPAROSCOPIC CHOLECYSTECTOMY    . LAPAROTOMY N/A 10/14/2017   Procedure: EXPLORATORY LAPAROTOMY for SMALL BOWEL OBSTRUCTION,  cecopexy;  Surgeon: Excell Seltzer, MD;  Location: [facility];  Service: General;  Laterality: N/A;  . LAPAROTOMY N/A 11/09/2017   Procedure: EXPLORATORY LAPAROTOMY reduction small bowel valvulous;  Surgeon: Rolm Bookbinder, MD;  Location: [facility];  Service: General;  Laterality: N/A;  . LAPAROTOMY N/A 01/10/2018   Procedure: exploratory laparotomy, lysis of adhesions, decompression of midgut volvulus;  Surgeon: Jovita Kussmaul, MD;  Location: [facility];  Service: General;  Laterality: N/A;  . [place] N/A 10/09/2013   Procedure: LEFT HEART CATHETERIZATION WITH CORONARY ANGIOGRAM;  Surgeon: Larey Dresser, MD;  Location: [facility] CATH LAB;  Service: Cardiovascular;  Laterality: N/A;  . PERCUTANEOUS CORONARY STENT INTERVENTION (PCI-S)  10/09/2013   Procedure: PERCUTANEOUS CORONARY STENT INTERVENTION (PCI-S);  Surgeon: Larey Dresser, MD;  Location: [facility] CATH LAB;  Service: Cardiovascular;;    Family History  Problem Relation Age of Onset  . Emphysema Father        died @ 81- smoked  . Heart disease Father   . Kidney Stones Father   . Diabetes Mother        died @ 60  . Stroke Mother   . Head & neck cancer Brother   . Lymphoma Brother   . Colon cancer Neg Hx   . Stomach cancer Neg Hx   . Rectal cancer Neg Hx   . Esophageal cancer Neg Hx   . Liver cancer Neg Hx    Social History:  reports that he quit smoking about 19 years ago. His smoking use included cigarettes. He has a 45.00 pack-year smoking history. He has never used smokeless tobacco. He reports that he does not drink alcohol or use drugs.  Allergies:  Allergies  Allergen Reactions  . Percocet [Oxycodone-Acetaminophen] Itching  . Metronidazole Other (See Comments)    Insomnia and nervousness    Prior to Admission medications   Medication Sig Start Date End Date Taking? Authorizing Provider  acetaminophen (TYLENOL) 500 MG tablet Take 1,000 mg by mouth every 6 (six) hours as needed for mild  pain.   Yes [provider]  Ascorbic Acid (VITAMIN C PO) Take 1,000 mg by mouth daily.    Yes [provider]  aspirin EC 81 MG tablet Take 1 tablet (81 mg total) by mouth daily. 10/02/13  Yes Larey Dresser, MD  BOSULIF 100 MG tablet Call Dr. [name], make an appointment and discuss when to resume this medicine. Patient taking differently: Take 400 mg by mouth daily with breakfast. Call Dr. [name], make an appointment and discuss when to resume this medicine. 10/19/17  Yes Earnstine Regal, PA-C  calcium carbonate (CALCIUM 600) 600 MG TABS tablet Resume this as before next week when you are eating better. Patient taking differently: Take 600 mg by mouth daily with breakfast. Resume this as before next week when you are eating better. 10/19/17  Yes Earnstine Regal, PA-C  carvedilol (COREG) 12.5 MG tablet TAKE 1/2 TABLET TWICE DAILY WITH A MEAL Patient taking differently: Take 6.25 mg by mouth 2 (two) times daily with a meal.  01/04/18  Yes Bensimhon, Shaune [place], MD  cyanocobalamin 1000 MCG tablet Take 1,000 mcg by mouth daily.   Yes [provider]  ezetimibe (ZETIA) 10 MG tablet Take 1 tablet (10 mg total) by mouth daily. 07/03/18 06/28/19 Yes Larey Dresser, MD  furosemide (LASIX) 20 MG tablet TAKE ONE (1) TABLET BY MOUTH EACH DAY Patient taking differently: Take 20 mg by mouth daily.  04/12/18  Yes Larey Dresser, MD  losartan (COZAAR) 25 MG tablet TAKE 1/2 TABLET BY MOUTH DAILY 02/18/18  Yes Larey Dresser, MD  pyridOXINE (VITAMIN B-6) 100 MG tablet Take 100 mg by mouth daily.   Yes [provider]  Thiamine Mononitrate (VITAMIN B1 PO) Take 250 mg by mouth daily.   Yes [provider]  potassium chloride SA (K-DUR,KLOR-CON) 20 MEQ tablet Take 1 tablet (20 mEq total) by mouth daily. Patient not taking: Reported on 08/21/2018 03/02/18   Shelly Coss, MD     Results for orders placed or performed during the hospital encounter of 08/21/18 (from the  past 48 hour(s))  CBC with Differential     Status: Abnormal   Collection Time: 08/21/18  2:37 AM  Result Value Ref Range   WBC 8.2 4.0 - 10.5 K/uL   RBC 4.18 (L) 4.22 - 5.81 MIL/uL   Hemoglobin 12.2 (L) 13.0 - 17.0 g/dL   HCT 37.9 (L) 39.0 - 52.0 %   MCV 90.7 78.0 - 100.0 fL   MCH 29.2 26.0 - 34.0 pg   MCHC 32.2 30.0 - 36.0 g/dL   RDW 17.2 (H) 11.5 - 15.5 %   Platelets 185 150 - 400 K/uL   Neutrophils Relative % 73 %   Neutro Abs 5.9 1.7 - 7.7 K/uL   Lymphocytes Relative 13 %   Lymphs Abs 1.1 0.7 - 4.0 K/uL   Monocytes Relative 10 %   Monocytes Absolute 0.8 0.1 - 1.0 K/uL   Eosinophils Relative 4 %   Eosinophils Absolute 0.3 0.0 - 0.7 K/uL   Basophils Relative 0 %   Basophils Absolute 0.0 0.0 - 0.1 K/uL    Comment: Performed at [facility], [place] [address] [place], [place] [address]  Comprehensive metabolic panel     Status: Abnormal   Collection Time: 08/21/18  2:37 AM  Result Value Ref Range   Sodium 142 135 - 145 mmol/L   Potassium 3.6 3.5 - 5.1 mmol/L   Chloride 109 98 - 111 mmol/L   CO2 21 (L) 22 - 32 mmol/L   Glucose, Bld 102 (H) 70 - 99 mg/dL   BUN 24 (H) 8 - 23 mg/dL   Creatinine, Ser 1.67 (H) 0.61 - 1.24 mg/dL   Calcium 6.1 (LL) 8.9 - 10.3 mg/dL    Comment: CRITICAL RESULT CALLED TO, READ BACK BY AND VERIFIED WITH: BRADLEY,A RN AT 0340 08/21/18 BY TIBBITTS,K    Total Protein 7.4 6.5 - 8.1 g/dL   Albumin 4.0 3.5 - 5.0 g/dL   AST 25 15 - 41 U/L   ALT 16 0 - 44 U/L   Alkaline Phosphatase 102 38 - 126 U/L   Total Bilirubin 0.7 0.3 - 1.2 mg/dL   GFR calc non Af Amer 36 (L) >60 mL/min   GFR calc Af Amer 41 (L) >60 mL/min    Comment: (NOTE) The eGFR has been calculated using the CKD EPI equation. This calculation has not been validated in all clinical situations. eGFR's persistently <60 mL/min signify possible Chronic Kidney Disease.    Anion  gap 12 5 - 15    Comment: Performed at [facility], [place] [address]  [place], [place] [address]  Lipase, blood     Status: None   Collection Time: 08/21/18  2:37 AM  Result Value Ref Range   Lipase 27 11 - 51 U/L    Comment: Performed at [facility], [place] [address], [place], [place] [address]   Ct Abdomen Pelvis Wo Contrast  Result Date: 08/21/2018 CLINICAL DATA:  Nausea and vomiting.  Lower abdominal pain. EXAM: CT ABDOMEN AND PELVIS WITHOUT CONTRAST TECHNIQUE: Multidetector CT imaging of the abdomen and pelvis was performed following the standard protocol without IV contrast. COMPARISON:  CT 01/10/2018 FINDINGS: Lower chest: Subpleural reticulation in both lung bases with emphysema. No confluent consolidation. Small hiatal hernia. Hepatobiliary: No focal hepatic abnormality on noncontrast exam. Postcholecystectomy with grossly unchanged biliary dilatation allowing for lack contrast on the current exam. Pancreas: No ductal dilatation or inflammation. Spleen: Normal in size without focal abnormality. Adrenals/Urinary Tract: No adrenal nodule. Chronic left UPJ obstruction configuration of the kidney, unchanged from prior. Bilateral nonobstructing renal stones. No hydronephrosis. No perinephric edema. Urinary bladder is physiologically distended without wall thickening. Stomach/Bowel: Surgical clips at the gastroesophageal junction. Mild gastric wall thickening about the cardia. No abnormal gastric distension. Again seen mesenteric swirling in the left abdomen, similar in appearance to prior exam. Scattered dilatation of small bowel in the upper abdomen. Enteric suture involving small bowel in the pelvis with small associated wall thickening. Colonic interposition under the right hemidiaphragm. No colonic dilatation or inflammatory change. Sigmoid diverticulosis without diverticulitis. Vascular/Lymphatic: Prominent mesenteric nodes again seen, not well assessed in the absence of IV contrast. Aortic and branch atherosclerosis. Ectatic infrarenal aorta maximal  dimension 2.9 cm. Reproductive: Prominent prostate gland. Other: Small volume mesenteric ascites. Small volume of ascites in the upper abdomen. No free air. Musculoskeletal: There are no acute or suspicious osseous abnormalities. IMPRESSION: 1. Small bowel mesenteric swirling concerning for recurrent midgut volvulus, similar in appearance to prior exam. Small volume mesenteric and abdominal ascites. 2. Bilateral nonobstructing nephrolithiasis. Chronic UPJ obstruction appearance of the left kidney. 3. Additional chronic findings include colonic diverticulosis, small hiatal hernia, and Aortic Atherosclerosis (ICD10-I70.0). Stable infrarenal aortic ectasia. These results were called by telephone at the time of interpretation on 08/21/2018 at 6:10 am to Dr. Thayer Jew , who verbally acknowledged these results. Electronically Signed   By: Keith Rake M.D.   On: 08/21/2018 06:11    Review of Systems  Constitutional: Negative.   HENT: Positive for hearing loss (left ear).   Eyes: Negative.   Respiratory: Negative.   Cardiovascular: Negative.   Gastrointestinal: Positive for abdominal pain (severe started about 2 PM yesterday acutely, and has become worse over night), heartburn and nausea.       Complains of increased gas with belching and flatus  Genitourinary: Negative.   Musculoskeletal: Negative.   Skin: Negative.   Neurological: Negative.   Endo/Heme/Allergies: Negative.   Psychiatric/Behavioral: Negative.     Blood pressure 136/69, pulse 60, temperature (!) 97.5 F (36.4 C), temperature source Oral, resp. rate 17, SpO2 99 %. Physical Exam  Constitutional: He is oriented to person, place, and time. He appears well-developed and well-nourished. No distress.  Thin frail cachectic male with moderated discomfort  HENT:  Head: Normocephalic and atraumatic.  Mouth/Throat: Oropharynx is clear and moist. No oropharyngeal exudate.  Eyes: Right eye exhibits no discharge. Left eye exhibits no  discharge. No scleral icterus.  Pupils  are equal  Neck: Normal range of motion. Neck supple. JVD (mild JVD) present. No tracheal deviation present. No thyromegaly present.  Cardiovascular: Normal rate, regular rhythm, normal heart sounds and intact distal pulses.  No murmur heard. Respiratory: Effort normal and breath sounds normal. No respiratory distress. He has no wheezes. He has no rales. He exhibits no tenderness.  GI: He exhibits distension. He exhibits no mass. There is tenderness. There is no rebound and no guarding.  Mild distension, just had Fentanyl so pain is present but controlled.   Midline surgical scars well healed  Musculoskeletal: He exhibits no edema or tenderness.  Lymphadenopathy:    He has no cervical adenopathy.  Neurological: He is alert and oriented to person, place, and time. No cranial nerve deficit.  Skin: Skin is warm and dry. No rash noted. He is not diaphoretic. No erythema. No pallor.  Psychiatric: He has a normal mood and affect. His behavior is normal. Judgment and thought content normal.     Assessment/Plan Chronic myelocytic leukemia -on oral therapy Coronary artery disease with prior LAD stent Hx of systolic CHF(EF 30 to 80%, grade 1 diastolic dysfunction, mild to moderate AR) COPD/emphysema -44-pack-year history of tobacco use; quit in his 16s Carotid artery stenosis with prior right carotid endarterectomy Hx chronic kidney disease stage III  Abdominal pain with recurrent small bowel volvulus. Status post multiple abdominal surgeries including exploratory laparotomy reduction of small bowel volvulus 11/09/2017, 10/14/2017 and 01/10/2018.  Plan: Admit the patient, continue IV hydration, patient is going to the OR for exploratory laparotomy from the ED.  Medicine consult to help with management of his coronary artery disease chronic congestive heart failure, COPD and leukemia postop. Afnan Emberton, PA-C 08/21/2018, 7:50 AM

## 2018-08-21 NOTE — ED Provider Notes (Signed)
[facility] DEPT Provider Note   CSN: [id number] Arrival date & time: 08/21/18  0130     History   Chief Complaint Chief Complaint  Patient presents with  . Abdominal Pain    HPI [name] [name] is a 84 y.o. male.  HPI  This is a an 84 year old male with history of chronic kidney disease, coronary artery disease, recurrent midgut volvulus, bowel obstruction with perforation who presents with abdominal pain and nausea vomiting.  Reports onset of abdominal pain yesterday morning around 10 AM.  Last normal bowel movement was prior to that.  He has had one episode of nonbilious, nonbloody emesis.  He reports diffuse sharp abdominal pain that is nonradiating.  It seems to come and go.  He has had similar pain in the past related to his prior volvulus.  Currently he is pain-free.  He denies any fevers or urinary symptoms.  I have reviewed his chart.  Patient was taken to the operating room in October 2018, December 2018, and February 2019 for lysis of adhesions and midgut volvulus.  Past Medical History:  Diagnosis Date  . Carotid artery occlusion   . Chronic systolic CHF (congestive heart failure) ([place])    a. 07/2013 Echo: EF 35-40%  . CKD (chronic kidney disease), stage III ([place])    pt. states that he does not have CKD  . CML (chronic myelocytic leukemia) ([place])   . Coronary artery disease    a. 09/2013 Cath: LM min irregs, LAD 95p(2.75x20 Promus Premier DES & 3.0x20 Promus Premier DES overlapping), D1 50-60, LCX 30p, OM1 28m, OM2 60-70p, RCA small, nl.  . Diverticulitis   . Dysphonia 01/08/2017  . History of hiatal hernia   . History of kidney stones   . History of small bowel obstruction   . Hyperlipidemia    denies  . Ischemic cardiomyopathy    a. 07/2013 Echo: EF 35-40%, apical septal, apical lat, apical AK, mod MR.  . Small bowel perforation ([place])    a. 11/2012 s/p emergent SB resection.  . 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    Patient Active Problem  List   Diagnosis Date Noted  . Protein-calorie malnutrition, severe 01/14/2018  . Small bowel volvulus ([place]) 11/10/2017  . Acidosis, metabolic, with respiratory acidosis 11/10/2017  . Encounter for weaning from ventilator ([place]) 11/10/2017  . CKD (chronic kidney disease), stage III ([place]) 11/10/2017  . Status post exploratory laparotomy 11/09/2017  . Intestinal volvulus ([place])   . Bilateral rales   . 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 Cough 08/17/2016  . Carotid stenosis 09/17/2015  . Right carotid bruit 12/14/2014  . Unstable angina ([place]) 10/10/2013  . Ischemic cardiomyopathy   . Hyperlipidemia   . Chronic systolic CHF (congestive heart failure) ([place]) 10/03/2013  . CAD (coronary artery disease) 10/03/2013  . Acute systolic heart failure ([place]) 08/11/2013  . Chronic kidney disease (CKD), stage III (moderate) ([facility]) 08/11/2013  . Acute respiratory failure with hypoxia ([place]) 08/09/2013  . Pulmonary edema 08/09/2013  . Small bowel obstruction ([place]) 08/09/2013    Past Surgical History:  Procedure Laterality Date  . BOWEL RESECTION     3cm sm intestine  . CARDIAC CATHETERIZATION  10/09/2013  . CAROTID ENDARTERECTOMY    . CHOLECYSTECTOMY    . COLON SURGERY  11/2012   BOWEL SURGERY   . CORONARY ANGIOPLASTY  10/09/2013   MID LAD  . ENDARTERECTOMY Right 09/17/2015   Procedure: Right Carotid ENDARTERECTOMY with Xenosure Patch Angioplasty;  Surgeon: Serafina Mitchell, MD;  Location: [facility] OR;  Service: Vascular;  Laterality: Right;  . EYE SURGERY Bilateral   . INGUINAL HERNIA REPAIR Bilateral   . LAPAROSCOPIC CHOLECYSTECTOMY    . LAPAROTOMY N/A 10/14/2017   Procedure: EXPLORATORY LAPAROTOMY for SMALL BOWEL OBSTRUCTION, cecopexy;  Surgeon: Excell Seltzer, MD;  Location: [facility] ORS;  Service: General;  Laterality: N/A;  . LAPAROTOMY N/A 11/09/2017   Procedure: EXPLORATORY LAPAROTOMY reduction small bowel valvulous;  Surgeon: Rolm Bookbinder, MD;  Location: [facility] ORS;  Service: General;  Laterality:  N/A;  . LAPAROTOMY N/A 01/10/2018   Procedure: exploratory laparotomy, lysis of adhesions, decompression of midgut volvulus;  Surgeon: Jovita Kussmaul, MD;  Location: [facility] ORS;  Service: General;  Laterality: N/A;  . Devol N/A 10/09/2013   Procedure: LEFT HEART CATHETERIZATION WITH CORONARY ANGIOGRAM;  Surgeon: Larey Dresser, MD;  Location: [facility] CATH LAB;  Service: Cardiovascular;  Laterality: N/A;  . PERCUTANEOUS CORONARY STENT INTERVENTION (PCI-S)  10/09/2013   Procedure: PERCUTANEOUS CORONARY STENT INTERVENTION (PCI-S);  Surgeon: Larey Dresser, MD;  Location: [facility] CATH LAB;  Service: Cardiovascular;;        Home Medications    Prior to Admission medications   Medication Sig Start Date End Date Taking? Authorizing Provider  acetaminophen (TYLENOL) 500 MG tablet Take 1,000 mg by mouth every 6 (six) hours as needed for mild pain.   Yes [provider]  Ascorbic Acid (VITAMIN C PO) Take 1,000 mg by mouth daily.    Yes [provider]  aspirin EC 81 MG tablet Take 1 tablet (81 mg total) by mouth daily. 10/02/13  Yes Larey Dresser, MD  BOSULIF 100 MG tablet Call Dr. [name], make an appointment and discuss when to resume this medicine. Patient taking differently: Take 400 mg by mouth daily with breakfast. Call Dr. [name], make an appointment and discuss when to resume this medicine. 10/19/17  Yes Earnstine Regal, PA-C  calcium carbonate (CALCIUM 600) 600 MG TABS tablet Resume this as before next week when you are eating better. Patient taking differently: Take 600 mg by mouth daily with breakfast. Resume this as before next week when you are eating better. 10/19/17  Yes Earnstine Regal, PA-C  carvedilol (COREG) 12.5 MG tablet TAKE 1/2 TABLET TWICE DAILY WITH A MEAL Patient taking differently: Take 6.25 mg by mouth 2 (two) times daily with a meal.  01/04/18  Yes Bensimhon, Shaune Pascal, MD  cyanocobalamin 1000 MCG tablet Take 1,000 mcg by  mouth daily.   Yes [provider]  ezetimibe (ZETIA) 10 MG tablet Take 1 tablet (10 mg total) by mouth daily. 07/03/18 06/28/19 Yes Larey Dresser, MD  furosemide (LASIX) 20 MG tablet TAKE ONE (1) TABLET BY MOUTH EACH DAY Patient taking differently: Take 20 mg by mouth daily.  04/12/18  Yes Larey Dresser, MD  losartan (COZAAR) 25 MG tablet TAKE 1/2 TABLET BY MOUTH DAILY 02/18/18  Yes Larey Dresser, MD  pyridOXINE (VITAMIN B-6) 100 MG tablet Take 100 mg by mouth daily.   Yes [provider]  Thiamine Mononitrate (VITAMIN B1 PO) Take 250 mg by mouth daily.   Yes [provider]  potassium chloride SA (K-DUR,KLOR-CON) 20 MEQ tablet Take 1 tablet (20 mEq total) by mouth daily. Patient not taking: Reported on 08/21/2018 03/02/18   Shelly Coss, MD    Family History Family History  Problem Relation Age of Onset  . Emphysema Father        died @ 57- smoked  .  Heart disease Father   . Kidney Stones Father   . Diabetes Mother        died @ 43  . Stroke Mother   . Head & neck cancer Brother   . Lymphoma Brother   . Colon cancer Neg Hx   . Stomach cancer Neg Hx   . Rectal cancer Neg Hx   . Esophageal cancer Neg Hx   . Liver cancer Neg Hx     Social History Social History   Tobacco Use  . Smoking status: Former Smoker    Packs/day: 1.00    Years: 45.00    Pack years: 45.00    Types: Cigarettes    Last attempt to quit: 06/09/1999    Years since quitting: 19.2  . Smokeless tobacco: Never Used  Substance Use Topics  . Alcohol use: No    Alcohol/week: 0.0 standard drinks  . Drug use: No     Allergies   Percocet [oxycodone-acetaminophen] and Metronidazole   Review of Systems Review of Systems  Constitutional: Negative for fever.  Respiratory: Negative for shortness of breath.   Cardiovascular: Negative for chest pain.  Gastrointestinal: Positive for abdominal pain, nausea and vomiting. Negative for constipation and diarrhea.  Genitourinary:  Negative for dysuria.  All other systems reviewed and are negative.    Physical Exam Updated Vital Signs BP (!) 142/72   Pulse (!) 59   Temp (!) 97.5 F (36.4 C) (Oral)   Resp 14   SpO2 99%   Physical Exam  Constitutional: He is oriented to person, place, and time. He appears well-developed and well-nourished.  Non-ill-appearing  HENT:  Head: Normocephalic and atraumatic.  Eyes: Pupils are equal, round, and reactive to light.  Neck: Neck supple.  Cardiovascular: Normal rate, regular rhythm and normal heart sounds.  No murmur heard. Pulmonary/Chest: Effort normal and breath sounds normal. No respiratory distress. He has no wheezes.  Abdominal: Soft. Bowel sounds are increased. There is generalized tenderness. There is no rebound and no guarding.  Extensive midline abdominal scarring well-healed, increased bowel sounds, mild diffuse tenderness to palpation, no bulging or masses  Musculoskeletal: He exhibits no edema.  Lymphadenopathy:    He has no cervical adenopathy.  Neurological: He is alert and oriented to person, place, and time.  Skin: Skin is warm and dry.  Psychiatric: He has a normal mood and affect.  Nursing note and vitals reviewed.    ED Treatments / Results  Labs (all labs ordered are listed, but only abnormal results are displayed) Labs Reviewed  CBC WITH DIFFERENTIAL/PLATELET - Abnormal; Notable for the following components:      Result Value   RBC 4.18 (*)    Hemoglobin 12.2 (*)    HCT 37.9 (*)    RDW 17.2 (*)    All other components within normal limits  COMPREHENSIVE METABOLIC PANEL - Abnormal; Notable for the following components:   CO2 21 (*)    Glucose, Bld 102 (*)    BUN 24 (*)    Creatinine, Ser 1.67 (*)    Calcium 6.1 (*)    GFR calc non Af Amer 36 (*)    GFR calc Af Amer 41 (*)    All other components within normal limits  LIPASE, BLOOD  URINALYSIS, ROUTINE W REFLEX MICROSCOPIC    EKG EKG Interpretation  Date/Time:  Wednesday  August 21 2018 04:17:27 EDT Ventricular Rate:  57 PR Interval:    QRS Duration: 87 QT Interval:  522 QTC Calculation: 509 R Axis:  73 Text Interpretation:  Sinus rhythm Nonspecific T wave abnormality Prolonged QT interval Confirmed by Thayer Jew [phone]) on 08/21/2018 4:45:59 AM   Radiology Ct Abdomen Pelvis Wo Contrast  Result Date: 08/21/2018 CLINICAL DATA:  Nausea and vomiting.  Lower abdominal pain. EXAM: CT ABDOMEN AND PELVIS WITHOUT CONTRAST TECHNIQUE: Multidetector CT imaging of the abdomen and pelvis was performed following the standard protocol without IV contrast. COMPARISON:  CT 01/10/2018 FINDINGS: Lower chest: Subpleural reticulation in both lung bases with emphysema. No confluent consolidation. Small hiatal hernia. Hepatobiliary: No focal hepatic abnormality on noncontrast exam. Postcholecystectomy with grossly unchanged biliary dilatation allowing for lack contrast on the current exam. Pancreas: No ductal dilatation or inflammation. Spleen: Normal in size without focal abnormality. Adrenals/Urinary Tract: No adrenal nodule. Chronic left UPJ obstruction configuration of the kidney, unchanged from prior. Bilateral nonobstructing renal stones. No hydronephrosis. No perinephric edema. Urinary bladder is physiologically distended without wall thickening. Stomach/Bowel: Surgical clips at the gastroesophageal junction. Mild gastric wall thickening about the cardia. No abnormal gastric distension. Again seen mesenteric swirling in the left abdomen, similar in appearance to prior exam. Scattered dilatation of small bowel in the upper abdomen. Enteric suture involving small bowel in the pelvis with small associated wall thickening. Colonic interposition under the right hemidiaphragm. No colonic dilatation or inflammatory change. Sigmoid diverticulosis without diverticulitis. Vascular/Lymphatic: Prominent mesenteric nodes again seen, not well assessed in the absence of IV contrast. Aortic and  branch atherosclerosis. Ectatic infrarenal aorta maximal dimension 2.9 cm. Reproductive: Prominent prostate gland. Other: Small volume mesenteric ascites. Small volume of ascites in the upper abdomen. No free air. Musculoskeletal: There are no acute or suspicious osseous abnormalities. IMPRESSION: 1. Small bowel mesenteric swirling concerning for recurrent midgut volvulus, similar in appearance to prior exam. Small volume mesenteric and abdominal ascites. 2. Bilateral nonobstructing nephrolithiasis. Chronic UPJ obstruction appearance of the left kidney. 3. Additional chronic findings include colonic diverticulosis, small hiatal hernia, and Aortic Atherosclerosis (ICD10-I70.0). Stable infrarenal aortic ectasia. These results were called by telephone at the time of interpretation on 08/21/2018 at 6:10 am to Dr. Thayer Jew , who verbally acknowledged these results. Electronically Signed   By: Keith Rake M.D.   On: 08/21/2018 06:11    Procedures Procedures (including critical care time)  CRITICAL CARE Performed by: Merryl Hacker   Total critical care time: 35 minutes  Critical care time was exclusive of separately billable procedures and treating other patients.  Critical care was necessary to treat or prevent imminent or life-threatening deterioration.  Critical care was time spent personally by me on the following activities: development of treatment plan with patient and/or surrogate as well as nursing, discussions with consultants, evaluation of patient's response to treatment, examination of patient, obtaining history from patient or surrogate, ordering and performing treatments and interventions, ordering and review of laboratory studies, ordering and review of radiographic studies, pulse oximetry and re-evaluation of patient's condition.   Medications Ordered in ED Medications  fentaNYL (SUBLIMAZE) injection 100 mcg (has no administration in time range)    piperacillin-tazobactam (ZOSYN) IVPB 3.375 g (has no administration in time range)  calcium gluconate 1 g in sodium chloride 0.9 % 100 mL IVPB (0 g Intravenous Stopped 08/21/18 0456)  iohexol (OMNIPAQUE) 300 MG/ML solution 30 mL (30 mLs Intravenous Contrast Given 08/21/18 0418)     Initial Impression / Assessment and Plan / ED Course  I have reviewed the triage vital signs and the nursing notes.  Pertinent labs & imaging results that were available during my  care of the patient were reviewed by me and considered in my medical decision making (see chart for details).     Patient presents with abdominal pain and one episode of vomiting.  States that this feels like his prior volvulus.  Currently he is pain-free.  His abdomen is soft.  He does have hyperactive bowel sounds and some mild tenderness but no peritonitis.  His vital signs are reassuring.  Patient was made n.p.o.  Given that he is not currently in pain, initially pain medications were held off.  Lab work obtained and a CT scan was ordered.  Lab work is notable for hypocalcemia with a calcium of 6.1.  Albumin is normal.  Last calcium marginally low at 8.2.  Patient was given 1 g of calcium gluconate.  He is otherwise asymptomatic.  CT scan is concerning for recurrent midgut volvulus.  General surgery was consulted.  I discussed the patient with Dr. Donne Hazel.  Patient was started on Zosyn.  Plan for daytime surgeon to evaluate the patient.  I updated the patient and his family.    Final Clinical Impressions(s) / ED Diagnoses   Final diagnoses:  Midgut volvulus  Hypocalcemia    ED Discharge Orders    None       Merryl Hacker, MD 08/21/18 [phone]

## 2018-08-21 NOTE — ED Notes (Signed)
Pt unable to urinate right now

## 2018-08-21 NOTE — ED Notes (Addendum)
Received critical value- Calcium of 6.1 Dr. Dina Rich made aware

## 2018-08-21 NOTE — Anesthesia Postprocedure Evaluation (Signed)
Anesthesia Post Note  Patient: Tahmid Stonehocker  Procedure(s) Performed: EXPLORATORY LAPAROTOMY (N/A )     Patient location during evaluation: PACU Anesthesia Type: General Level of consciousness: awake and alert Pain management: pain level controlled Vital Signs Assessment: post-procedure vital signs reviewed and stable Respiratory status: spontaneous breathing, nonlabored ventilation, respiratory function stable and patient connected to nasal cannula oxygen Cardiovascular status: blood pressure returned to baseline and stable Postop Assessment: no apparent nausea or vomiting Anesthetic complications: no    Last Vitals:  Vitals:   08/21/18 1414 08/21/18 1441  BP:    Pulse: 69   Resp: 20   Temp: (!) 36.4 C 36.8 C  SpO2: 99%     Last Pain:  Vitals:   08/21/18 1508  TempSrc:   PainSc: 6                  Nakhia Levitan

## 2018-08-21 NOTE — ED Notes (Signed)
Pt has urinal and is aware of need for UA

## 2018-08-21 NOTE — ED Notes (Signed)
Patient transported to CT 

## 2018-08-21 NOTE — Anesthesia Procedure Notes (Signed)
Procedure Name: Intubation Performed by: Gean Maidens, CRNA Pre-anesthesia Checklist: Patient identified, Emergency Drugs available, Suction available, Patient being monitored and Timeout performed Patient Re-evaluated:Patient Re-evaluated prior to induction Oxygen Delivery Method: Circle system utilized Preoxygenation: Pre-oxygenation with 100% oxygen Induction Type: IV induction and Rapid sequence Laryngoscope Size: Mac and 4 Grade View: Grade I Tube type: Oral Tube size: 7.5 mm Number of attempts: 1 Airway Equipment and Method: Stylet Placement Confirmation: ETT inserted through vocal cords under direct vision,  positive ETCO2 and breath sounds checked- equal and bilateral Secured at: 23 cm Tube secured with: Tape Dental Injury: Teeth and Oropharynx as per pre-operative assessment

## 2018-08-21 NOTE — Transfer of Care (Signed)
Immediate Anesthesia Transfer of Care Note  Patient: [name] [name]  Procedure(s) Performed: EXPLORATORY LAPAROTOMY (N/A )  Patient Location: PACU  Anesthesia Type:General  Level of Consciousness: awake, alert  and oriented  Airway & Oxygen Therapy: Patient Spontanous Breathing and Patient connected to face mask oxygen  Post-op Assessment: Report given to RN and Post -op Vital signs reviewed and stable  Post vital signs: Reviewed and stable  Last Vitals:  Vitals Value Taken Time  BP    Temp    Pulse 73 08/21/2018  1:23 PM  Resp 16 08/21/2018  1:23 PM  SpO2 100 % 08/21/2018  1:23 PM  Vitals shown include unvalidated device data.  Last Pain:  Vitals:   08/21/18 0921  TempSrc:   PainSc: 0-No pain         Complications: No apparent anesthesia complications

## 2018-08-21 NOTE — ED Notes (Signed)
Surgeons at bedside.

## 2018-08-21 NOTE — Op Note (Signed)
Preoperative Diagnosis:  Midgut volvulus [Q43.3]  Postoprative Diagnosis:  Midgut volvulus [Q43.3]  Procedure: Procedure(s): EXPLORATORY LAPAROTOMY   Surgeon: Excell Seltzer T   Assistants: Jackson Latino PA  Anesthesia:  General endotracheal anesthesia  Indications: Patient is an 84 year old male with a history of recurrent midgut volvulus.  Over the last 2 years he has had 3 laparotomies for midgut volvulus.  I did his initial surgery and did a cecopexy at that time but he had a recurrence just a few months later.  Last surgery was in February of this year and the last 2 procedures he had just reduction of his volvulus.  He is been doing generally okay with some occasional gas bloating until last night when he again developed constant mid abdominal pain and some distention identical to his previous presentations.  CT scan again shows an identical picture of significant twist of the base of his small bowel mesentery with some bowel dilatation.  He has ongoing pain.  I recommended proceeding with laparotomy.  Discussed the procedure and indications and risks with the patient and family and they agree.    Procedure Detail: Patient was brought to the operating room, placed in supine position on the operating table, and general endotracheal anesthesia induced.  He was given preoperative IV antibiotics.  PAS were in place.  The abdomen was widely sterilely prepped and draped.  Patient timeout was performed and correct procedure verified.  The previous midline scar was excised sharply to healthy skin and dissection was carried down through the midline fascia.  The peritoneal cavity was entered under direct vision.  There were some omental adhesions to the upper midline that were taken down with cautery.  There was one knuckle of small bowel adhered to the lower incision which was taken down sharply without injury.  At this point the bowel was completely free.  As noted previously there was about a  270 degree rotation of the entire small bowel mesentery and a clockwise direction.  There was some chylous congestion of the small bowel and mild dilatation but certainly no ischemia or marked dilatation.  Through identification of the ileocecal valve and the ligament of Treitz with rotation of the mesentery and the entire small bowel this was reduced.  I then ran the bowel proximally and distally from the ligament of Treitz to the terminal ileum confirming the mesentery was in normal orientation.  There was a somewhat patulous widely patent old anastomosis in the jejunum.  He had some moderate sized jejunal diverticuli that were not inflamed.  There was some chronic scarring over the ileum but no stricturing or major abnormality.  Small bowel again was noted to be very mobile on its mesentery.  I did not see anything that would prevent this from happening again and elected to perform an entero-pexy to try to obtain some fixation of the small bowel.  I started at the ligament of Treitz and traced the small bowel distally.  Periodically I fixed the small bowel up to the anterior abdominal wall at least 5 cm away from the midline incision.  I used 2 sutures at each point to hopefully prevent twisting at that spot.  I sutured the proximal small bowel in 3 or 4 locations along the left anterior abdominal wall working down toward the mid small bowel.  At this point I took the bowel across the midline up to the right upper quadrant and again fixed the bowel working proximal to distal at periodic points probably about every  30 cm to the anterior abdominal wall working down toward the terminal ileum and the low midline.  I then again ran all the small bowel to make sure it was in normal orientation and not kinked or obstructed by any of the sutures.  There was no bleeding or evidence of injury or other problems.  The midline fascia was closed using running looped #1 PDS begun at either end of the incision and tied  centrally.  Subtenons tissue was irrigated and skin closed using staples.  Sponge needle and instrument counts were correct.    Findings: As above  Estimated Blood Loss:  less than 50 mL         Drains: None  Blood Given: none          Specimens: None        Complications:  * No complications entered in OR log *         Disposition: PACU - hemodynamically stable.         Condition: stable

## 2018-08-21 NOTE — ED Notes (Signed)
Shortstay at bedside, taking patient to OR.

## 2018-08-21 NOTE — Anesthesia Preprocedure Evaluation (Addendum)
Anesthesia Evaluation  Patient identified by MRN, date of birth, ID band Patient awake    Reviewed: Allergy & Precautions, NPO status , Patient's Chart, lab work & pertinent test results  Airway Mallampati: II  TM Distance: >3 FB Neck ROM: Full    Dental no notable dental hx. (+) Teeth Intact, Poor Dentition, Partial Upper, Partial Lower, Missing,    Pulmonary COPD, former smoker,    Pulmonary exam normal breath sounds clear to auscultation       Cardiovascular + CAD, + Cardiac Stents, + Peripheral Vascular Disease and +CHF  Normal cardiovascular exam Rhythm:Regular Rate:Normal  ECHO 4/19 Left ventricle: The cavity size was normal. There was moderate   concentric hypertrophy. Systolic function was moderately to   severely reduced. The estimated ejection fraction was in the   range of 30% to 35%. Akinesis of the mid-apicalanteroseptal,   anterior, and apical myocardium. Severe hypokinesis of the   inferior myocardium. Doppler parameters are consistent with   abnormal left ventricular relaxation (grade 1 diastolic   dysfunction). - Aortic valve: Trileaflet; normal thickness, mildly calcified   leaflets. There was mild to moderate regurgitation. - Mitral valve: Moderately calcified annulus. There was moderate   regurgitation. - Left atrium: The atrium was mildly dilated. - Pulmonary arteries: Systolic pressure was moderately increased.   PA peak pressure: 59 mm Hg (S).    Neuro/Psych negative neurological ROS  negative psych ROS   GI/Hepatic Neg liver ROS,   Endo/Other  negative endocrine ROS  Renal/GU negative Renal ROS  negative genitourinary   Musculoskeletal negative musculoskeletal ROS (+)   Abdominal   Peds negative pediatric ROS (+)  Hematology negative hematology ROS (+)   Anesthesia Other Findings   Reproductive/Obstetrics negative OB ROS                           Anesthesia  Physical  Anesthesia Plan  ASA: III and emergent  Anesthesia Plan: General   Post-op Pain Management:    Induction: Intravenous and Rapid sequence  PONV Risk Score and Plan: 2 and Ondansetron, Dexamethasone and Treatment may vary due to age or medical condition  Airway Management Planned: Oral ETT  Additional Equipment:   Intra-op Plan:   Post-operative Plan: Possible Post-op intubation/ventilation  Informed Consent: I have reviewed the patients History and Physical, chart, labs and discussed the procedure including the risks, benefits and alternatives for the proposed anesthesia with the patient or authorized representative who has indicated his/her understanding and acceptance.   Dental advisory given  Plan Discussed with: CRNA, Surgeon and Anesthesiologist  Anesthesia Plan Comments:         Anesthesia Quick Evaluation

## 2018-08-22 ENCOUNTER — Encounter (HOSPITAL_COMMUNITY): Payer: Self-pay | Admitting: General Surgery

## 2018-08-22 DIAGNOSIS — Q433 Congenital malformations of intestinal fixation: Secondary | ICD-10-CM

## 2018-08-22 DIAGNOSIS — I2510 Atherosclerotic heart disease of native coronary artery without angina pectoris: Secondary | ICD-10-CM

## 2018-08-22 LAB — BASIC METABOLIC PANEL
Anion gap: 11 (ref 5–15)
BUN: 19 mg/dL (ref 8–23)
CO2: 20 mmol/L — ABNORMAL LOW (ref 22–32)
Calcium: 6 mg/dL — CL (ref 8.9–10.3)
Chloride: 110 mmol/L (ref 98–111)
Creatinine, Ser: 1.36 mg/dL — ABNORMAL HIGH (ref 0.61–1.24)
GFR calc Af Amer: 53 mL/min — ABNORMAL LOW (ref >60)
GFR calc non Af Amer: 46 mL/min — ABNORMAL LOW (ref >60)
Glucose, Bld: 105 mg/dL — ABNORMAL HIGH (ref 70–99)
Potassium: 3.9 mmol/L (ref 3.5–5.1)
Sodium: 141 mmol/L (ref 135–145)

## 2018-08-22 LAB — CBC
HCT: 37 % — ABNORMAL LOW (ref 39.0–52.0)
Hemoglobin: 11.6 g/dL — ABNORMAL LOW (ref 13.0–17.0)
MCH: 28.6 pg (ref 26.0–34.0)
MCHC: 31.4 g/dL (ref 30.0–36.0)
MCV: 91.1 fL (ref 78.0–100.0)
Platelets: 163 10*3/uL (ref 150–400)
RBC: 4.06 MIL/uL — ABNORMAL LOW (ref 4.22–5.81)
RDW: 17.4 % — ABNORMAL HIGH (ref 11.5–15.5)
WBC: 10.4 10*3/uL (ref 4.0–10.5)

## 2018-08-22 LAB — MAGNESIUM: Magnesium: 1.6 mg/dL — ABNORMAL LOW (ref 1.7–2.4)

## 2018-08-22 LAB — MRSA PCR SCREENING: MRSA by PCR: NEGATIVE

## 2018-08-22 MED ORDER — METHOCARBAMOL 1000 MG/10ML IJ SOLN
500.0000 mg | Freq: Three times a day (TID) | INTRAVENOUS | Status: DC
  Administered 2018-08-22 – 2018-08-26 (×15): 500 mg via INTRAVENOUS
  Filled 2018-08-22: qty 5
  Filled 2018-08-22 (×2): qty 500
  Filled 2018-08-22: qty 5
  Filled 2018-08-22: qty 500
  Filled 2018-08-22: qty 5
  Filled 2018-08-22: qty 500
  Filled 2018-08-22: qty 5
  Filled 2018-08-22 (×4): qty 500
  Filled 2018-08-22: qty 5
  Filled 2018-08-22 (×4): qty 500

## 2018-08-22 MED ORDER — MAGNESIUM SULFATE 2 GM/50ML IV SOLN
2.0000 g | Freq: Once | INTRAVENOUS | Status: AC
  Administered 2018-08-22: 2 g via INTRAVENOUS
  Filled 2018-08-22: qty 50

## 2018-08-22 MED ORDER — FENTANYL CITRATE (PF) 100 MCG/2ML IJ SOLN
INTRAMUSCULAR | Status: AC
  Filled 2018-08-22: qty 2

## 2018-08-22 MED ORDER — ACETAMINOPHEN 10 MG/ML IV SOLN
1000.0000 mg | Freq: Four times a day (QID) | INTRAVENOUS | Status: AC
  Administered 2018-08-22 – 2018-08-23 (×4): 1000 mg via INTRAVENOUS
  Filled 2018-08-22 (×4): qty 100

## 2018-08-22 MED ORDER — SODIUM CHLORIDE 0.9 % IV SOLN
1.0000 g | Freq: Once | INTRAVENOUS | Status: AC
  Administered 2018-08-22: 1 g via INTRAVENOUS
  Filled 2018-08-22: qty 10

## 2018-08-22 MED ORDER — FENTANYL CITRATE (PF) 100 MCG/2ML IJ SOLN
25.0000 ug | INTRAMUSCULAR | Status: DC | PRN
  Administered 2018-08-22 (×3): 50 ug via INTRAVENOUS
  Administered 2018-08-22: 25 ug via INTRAVENOUS
  Administered 2018-08-22 – 2018-08-23 (×7): 50 ug via INTRAVENOUS
  Administered 2018-08-24 – 2018-08-26 (×14): 25 ug via INTRAVENOUS
  Filled 2018-08-22 (×24): qty 2

## 2018-08-22 MED ORDER — ENOXAPARIN SODIUM 40 MG/0.4ML ~~LOC~~ SOLN
40.0000 mg | SUBCUTANEOUS | Status: DC
  Administered 2018-08-22 – 2018-08-27 (×6): 40 mg via SUBCUTANEOUS
  Filled 2018-08-22 (×6): qty 0.4

## 2018-08-22 NOTE — Consult Note (Signed)
Patient Demographics  [name] [name], is a 84 y.o. male   MRN: [id number]   DOB - 1930-10-20  Admit Date - 08/21/2018    Outpatient Primary MD for the patient is Drake Leach, MD  Consult requested in the Hospital by [facility], [place], MD, On 08/22/2018    Reason for consult  Medical management   With History of -  Past Medical History:  Diagnosis Date  . Carotid artery occlusion   . Chronic systolic CHF (congestive heart failure) ([place])    a. 07/2013 Echo: EF 35-40%  . CKD (chronic kidney disease), stage III ([place])    pt. states that he does not have CKD  . CML (chronic myelocytic leukemia) ([place])   . Coronary artery disease    a. 09/2013 Cath: LM min irregs, LAD 95p(2.75x20 Promus Premier DES & 3.0x20 Promus Premier DES overlapping), D1 50-60, LCX 30p, OM1 30m, OM2 60-70p, RCA small, nl.  . Diverticulitis   . Dysphonia 01/08/2017  . History of hiatal hernia   . History of kidney stones   . History of small bowel obstruction   . Hyperlipidemia    denies  . Ischemic cardiomyopathy    a. 07/2013 Echo: EF 35-40%, apical septal, apical lat, apical AK, mod MR.  . Small bowel perforation ([place])    a. 11/2012 s/p emergent SB resection.  . 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      Past Surgical History:  Procedure Laterality Date  . BOWEL RESECTION     3cm sm intestine  . CARDIAC CATHETERIZATION  10/09/2013  . CAROTID ENDARTERECTOMY    . CHOLECYSTECTOMY    . COLON SURGERY  11/2012   BOWEL SURGERY   . CORONARY ANGIOPLASTY  10/09/2013   MID LAD  . ENDARTERECTOMY Right 09/17/2015   Procedure: Right Carotid ENDARTERECTOMY with Xenosure Patch Angioplasty;  Surgeon: Serafina Mitchell, MD;  Location: [facility];  Service: Vascular;  Laterality: Right;  . EYE SURGERY Bilateral   . INGUINAL HERNIA REPAIR Bilateral   . LAPAROSCOPIC CHOLECYSTECTOMY    . LAPAROTOMY N/A 10/14/2017   Procedure: EXPLORATORY LAPAROTOMY for  SMALL BOWEL OBSTRUCTION, cecopexy;  Surgeon: Excell Seltzer, MD;  Location: [facility];  Service: General;  Laterality: N/A;  . LAPAROTOMY N/A 11/09/2017   Procedure: EXPLORATORY LAPAROTOMY reduction small bowel valvulous;  Surgeon: Rolm Bookbinder, MD;  Location: [facility];  Service: General;  Laterality: N/A;  . LAPAROTOMY N/A 01/10/2018   Procedure: exploratory laparotomy, lysis of adhesions, decompression of midgut volvulus;  Surgeon: Jovita Kussmaul, MD;  Location: [facility];  Service: General;  Laterality: N/A;  . LAPAROTOMY N/A 08/21/2018   Procedure: EXPLORATORY LAPAROTOMY;  Surgeon: Excell Seltzer, MD;  Location: [facility];  Service: General;  Laterality: N/A;  . LEFT HEART CATHETERIZATION WITH CORONARY ANGIOGRAM N/A 10/09/2013   Procedure: LEFT HEART CATHETERIZATION WITH CORONARY ANGIOGRAM;  Surgeon: Larey Dresser, MD;  Location: [facility] C[facility];  Service: Cardiovascular;  Laterality: N/A;  . PERCUTANEOUS CORONARY STENT INTERVENTION (PCI-S)  10/09/2013   Procedure: PERCUTANEOUS CORONARY  STENT INTERVENTION (PCI-S);  Surgeon: Larey Dresser, MD;  Location: [facility];  Service: Cardiovascular;;    in for   Chief Complaint  Patient presents with  . Abdominal Pain     HPI  [name] [name]  is a 84 y.o. male, with past medical history significant for CKD, CAD, dysphonia, CHF with EF 30 to 35%, carotid artery stenosis, patient with known history of small bowel obstruction in the past, but recently he had 3 episodes of small bowel volvulus, but he did require surgery on 10/14/2017, and 11/10/2017, and 01/10/2018, he presents with complaints of abdominal pain and distention, his work-up was significant for another small bowel volvulus as well, he went for expiratory laparotomy, with reduction of midgut volvulus with entero-pexy 08/21/2018 by Dr. Excell Seltzer, hospitalist service were consulted regarding management and his history of CHF and CAD, currently patient denies any chest pain, shortness of breath,  fever, chills, he reports significant postop abdominal pain, improved with fentanyl.    Review of Systems    In addition to the HPI above,  No Fever-chills, No Headache, No changes with Vision or hearing, No problems swallowing food or Liquids, No Chest pain, Cough or Shortness of Breath, Reports abdominal pain, remains n.p.o. No Blood in stool or Urine, No dysuria, No new skin rashes or bruises, No new joints pains-aches,  No new weakness, tingling, numbness in any extremity, No recent weight gain or loss, No polyuria, polydypsia or polyphagia, No significant Mental Stressors.  A full 10 point Review of Systems was done, except as stated above, all other Review of Systems were negative.   Social History Social History   Tobacco Use  . Smoking status: Former Smoker    Packs/day: 1.00    Years: 45.00    Pack years: 45.00    Types: Cigarettes    Last attempt to quit: 06/09/1999    Years since quitting: 19.2  . Smokeless tobacco: Never Used  Substance Use Topics  . Alcohol use: No    Alcohol/week: 0.0 standard drinks     Family History Family History  Problem Relation Age of Onset  . Emphysema Father        died @ 5- smoked  . Heart disease Father   . Kidney Stones Father   . Diabetes Mother        died @ 54  . Stroke Mother   . Head & neck cancer Brother   . Lymphoma Brother   . Colon cancer Neg Hx   . Stomach cancer Neg Hx   . Rectal cancer Neg Hx   . Esophageal cancer Neg Hx   . Liver cancer Neg Hx     Prior to Admission medications   Medication Sig Start Date End Date Taking? Authorizing Provider  acetaminophen (TYLENOL) 500 MG tablet Take 1,000 mg by mouth every 6 (six) hours as needed for mild pain.   Yes [provider]  Ascorbic Acid (VITAMIN C PO) Take 1,000 mg by mouth daily.    Yes [provider]  aspirin EC 81 MG tablet Take 1 tablet (81 mg total) by mouth daily. 10/02/13  Yes Larey Dresser, MD  BOSULIF 100 MG tablet  Call Dr. [name], make an appointment and discuss when to resume this medicine. Patient taking differently: Take 400 mg by mouth daily with breakfast. Call Dr. [name], make an appointment and discuss when to resume this medicine. 10/19/17  Yes Earnstine Regal, PA-C  calcium carbonate (CALCIUM 600) 600 MG TABS  tablet Resume this as before next week when you are eating better. Patient taking differently: Take 600 mg by mouth daily with breakfast. Resume this as before next week when you are eating better. 10/19/17  Yes Earnstine Regal, PA-C  carvedilol (COREG) 12.5 MG tablet TAKE 1/2 TABLET TWICE DAILY WITH A MEAL Patient taking differently: Take 6.25 mg by mouth 2 (two) times daily with a meal.  01/04/18  Yes Bensimhon, Shaune Pascal, MD  cyanocobalamin 1000 MCG tablet Take 1,000 mcg by mouth daily.   Yes [provider]  ezetimibe (ZETIA) 10 MG tablet Take 1 tablet (10 mg total) by mouth daily. 07/03/18 06/28/19 Yes Larey Dresser, MD  furosemide (LASIX) 20 MG tablet TAKE ONE (1) TABLET BY MOUTH EACH DAY Patient taking differently: Take 20 mg by mouth daily.  04/12/18  Yes Larey Dresser, MD  losartan (COZAAR) 25 MG tablet TAKE 1/2 TABLET BY MOUTH DAILY 02/18/18  Yes Larey Dresser, MD  pyridOXINE (VITAMIN B-6) 100 MG tablet Take 100 mg by mouth daily.   Yes [provider]  Thiamine Mononitrate (VITAMIN B1 PO) Take 250 mg by mouth daily.   Yes [provider]  potassium chloride SA (K-DUR,KLOR-CON) 20 MEQ tablet Take 1 tablet (20 mEq total) by mouth daily. Patient not taking: Reported on 08/21/2018 03/02/18   Shelly Coss, MD    Anti-infectives (From admission, onward)   Start     Dose/Rate Route Frequency Ordered Stop   08/21/18 0900  cefoTEtan (CEFOTAN) 2 g in sodium chloride 0.9 % 100 mL IVPB     2 g 200 mL/hr over 30 Minutes Intravenous On call to O.R. 08/21/18 0848 08/21/18 1202   08/21/18 0630  piperacillin-tazobactam (ZOSYN) IVPB 3.375 g     3.375 g 100 mL/hr over  30 Minutes Intravenous  Once 08/21/18 0626 08/21/18 0724      Scheduled Meds: . metoprolol tartrate  2.5 mg Intravenous Q6H   Continuous Infusions: . 0.9 % NaCl with KCl 20 mEq / L 100 mL/hr at 08/22/18 0548  . acetaminophen 1,000 mg (08/22/18 0954)  . calcium gluconate 1 GM IVPB    . magnesium sulfate 1 - 4 g bolus IVPB 2 g (08/22/18 0949)  . methocarbamol (ROBAXIN) IV Stopped (08/22/18 0918)   PRN Meds:.diphenhydrAMINE **OR** diphenhydrAMINE, fentaNYL (SUBLIMAZE) injection, menthol-cetylpyridinium, ondansetron **OR** ondansetron (ZOFRAN) IV  Allergies  Allergen Reactions  . Percocet [Oxycodone-Acetaminophen] Itching  . Metronidazole Other (See Comments)    Insomnia and nervousness    Physical Exam  Vitals  Blood pressure (!) 139/51, pulse 66, temperature 97.7 F (36.5 C), temperature source Oral, resp. rate 16, height 5\' 9"  (1.753 m), weight 55.2 kg, SpO2 100 %.   1. General thin appearing male, laying in bed in no apparent distress  2. Normal affect and insight, Not Suicidal or Homicidal, Awake Alert, Oriented X 3.  3. No F.N deficits, ALL C.Nerves Intact, Strength 5/5 all 4 extremities, Sensation intact all 4 extremities, Plantars down going.  4. Ears and Eyes appear Normal, Conjunctivae clear, PERRLA. Moist Oral Mucosa.  5. Supple Neck, No JVD, No cervical lymphadenopathy appriciated, No Carotid Bruits.  6. Symmetrical Chest wall movement, Good air movement bilaterally, CTAB.  7. RRR, No Gallops, Rubs or Murmurs, No Parasternal Heave.  8. Positive Bowel Sounds, and with midline surgical wound covered with honeycomb mesh,,No rebound -guarding or rigidity.  9.  No Cyanosis, Normal Skin Turgor, No Skin Rash or Bruise.  10. Good muscle tone,  joints appear normal ,  no effusions, Normal ROM.  11. No Palpable Lymph Nodes in Neck or Axillae   Data Review  CBC Recent Labs  Lab 08/21/18 0237 08/22/18 0336  WBC 8.2 10.4  HGB 12.2* 11.6*  HCT 37.9* 37.0*  PLT  185 163  MCV 90.7 91.1  MCH 29.2 28.6  MCHC 32.2 31.4  RDW 17.2* 17.4*  LYMPHSABS 1.1  --   MONOABS 0.8  --   EOSABS 0.3  --   BASOSABS 0.0  --    ------------------------------------------------------------------------------------------------------------------  Chemistries  Recent Labs  Lab 08/21/18 0237 08/21/18 0938 08/22/18 0336  NA 142  --  141  K 3.6  --  3.9  CL 109  --  110  CO2 21*  --  20*  GLUCOSE 102*  --  105*  BUN 24*  --  19  CREATININE 1.67*  --  1.36*  CALCIUM 6.1*  --  6.0*  MG  --  1.3* 1.6*  AST 25  --   --   ALT 16  --   --   ALKPHOS 102  --   --   BILITOT 0.7  --   --    ------------------------------------------------------------------------------------------------------------------ estimated creatinine clearance is 31 mL/min (A) (by C-G formula based on SCr of 1.36 mg/dL (H)). ------------------------------------------------------------------------------------------------------------------ No results for input(s): TSH, T4TOTAL, T3FREE, THYROIDAB in the last 72 hours.  Invalid input(s): FREET3   Coagulation profile Recent Labs  Lab 08/21/18 0938  INR 1.04   ------------------------------------------------------------------------------------------------------------------- No results for input(s): DDIMER in the last 72 hours. -------------------------------------------------------------------------------------------------------------------  Cardiac Enzymes No results for input(s): CKMB, TROPONINI, MYOGLOBIN in the last 168 hours.  Invalid input(s): CK ------------------------------------------------------------------------------------------------------------------ Invalid input(s): POCBNP   ---------------------------------------------------------------------------------------------------------------  Urinalysis    Component Value Date/Time   COLORURINE YELLOW 08/21/2018 0908   APPEARANCEUR CLEAR 08/21/2018 0908   LABSPEC 1.015  08/21/2018 0908   PHURINE 5.0 08/21/2018 0908   GLUCOSEU NEGATIVE 08/21/2018 0908   HGBUR NEGATIVE 08/21/2018 0908   BILIRUBINUR NEGATIVE 08/21/2018 0908   KETONESUR NEGATIVE 08/21/2018 0908   PROTEINUR 30 (A) 08/21/2018 0908   UROBILINOGEN 0.2 09/06/2015 1935   NITRITE NEGATIVE 08/21/2018 0908   LEUKOCYTESUR NEGATIVE 08/21/2018 0908     Imaging results:   Ct Abdomen Pelvis Wo Contrast  Result Date: 08/21/2018 CLINICAL DATA:  Nausea and vomiting.  Lower abdominal pain. EXAM: CT ABDOMEN AND PELVIS WITHOUT CONTRAST TECHNIQUE: Multidetector CT imaging of the abdomen and pelvis was performed following the standard protocol without IV contrast. COMPARISON:  CT 01/10/2018 FINDINGS: Lower chest: Subpleural reticulation in both lung bases with emphysema. No confluent consolidation. Small hiatal hernia. Hepatobiliary: No focal hepatic abnormality on noncontrast exam. Postcholecystectomy with grossly unchanged biliary dilatation allowing for lack contrast on the current exam. Pancreas: No ductal dilatation or inflammation. Spleen: Normal in size without focal abnormality. Adrenals/Urinary Tract: No adrenal nodule. Chronic left UPJ obstruction configuration of the kidney, unchanged from prior. Bilateral nonobstructing renal stones. No hydronephrosis. No perinephric edema. Urinary bladder is physiologically distended without wall thickening. Stomach/Bowel: Surgical clips at the gastroesophageal junction. Mild gastric wall thickening about the cardia. No abnormal gastric distension. Again seen mesenteric swirling in the left abdomen, similar in appearance to prior exam. Scattered dilatation of small bowel in the upper abdomen. Enteric suture involving small bowel in the pelvis with small associated wall thickening. Colonic interposition under the right hemidiaphragm. No colonic dilatation or inflammatory change. Sigmoid diverticulosis without diverticulitis. Vascular/Lymphatic: Prominent mesenteric nodes again  seen, not well assessed in the absence of IV contrast.  Aortic and branch atherosclerosis. Ectatic infrarenal aorta maximal dimension 2.9 cm. Reproductive: Prominent prostate gland. Other: Small volume mesenteric ascites. Small volume of ascites in the upper abdomen. No free air. Musculoskeletal: There are no acute or suspicious osseous abnormalities. IMPRESSION: 1. Small bowel mesenteric swirling concerning for recurrent midgut volvulus, similar in appearance to prior exam. Small volume mesenteric and abdominal ascites. 2. Bilateral nonobstructing nephrolithiasis. Chronic UPJ obstruction appearance of the left kidney. 3. Additional chronic findings include colonic diverticulosis, small hiatal hernia, and Aortic Atherosclerosis (ICD10-I70.0). Stable infrarenal aortic ectasia. These results were called by telephone at the time of interpretation on 08/21/2018 at 6:10 am to Dr. Thayer Jew , who verbally acknowledged these results. Electronically Signed   By: Keith Rake M.D.   On: 08/21/2018 06:11      Assessment & Plan  Active Problems:   Chronic kidney disease (CKD), stage III (moderate) ([facility])   Chronic systolic CHF (congestive heart failure) ([facility])   CAD (coronary artery disease)   Carotid stenosis   Volvulus of small intestine ([facility])  Recurrent small intestinal volvulus -Management per primary surgical teamhe ,went for expiratory laparotomy, with reduction of midgut volvulus with entero-pexy 08/21/2018 by Dr. Excell Seltzer -Remains n.p.o., NGT , and IV fluids, he is 100 cc/h, will continue with this rate today to monitor closely given his known CHF. -Would recommend to start start on chemical DVT prophylaxis if no contraindication -Patient encouraged to use incentive spirometry today  CKD stage III -Renal function at baseline, continue to monitor, continue with IV fluids, avoid nephrotoxic medication, monitor BMP closely  Chronic systolic CHF -Appears to be compensated, most recent echo  showing EF 30 to 35%, and her volume status closely, strict ins and outs, all meds on hold including Coreg and ACE, for now continue with metoprolol IV 2.5 mg every 6 hours scheduled with holding parameters. -Resume p.o. meds when able to take oral and if blood pressure is stable.  History of CML -Continue to hold Bosulif during hospital stay home until he is more stable  Date of CAD -Denies chest pain or shortness of breath, resume aspirin beta-blockers and ACE when able to take oral  Hypocalcemia/hypomagnesemia -replaced , recheck in a.m. we will recheck with phosphorus as well   DVT Prophylaxis  SCD  AM Labs Ordered, also please review Full Orders  Family Communication: Plan discussed with patient   Thank you for the consult, we will follow the patient with you in the Hospital.   Phillips Climes M.D on 08/22/2018 at 10:28 AM  Between 7am to 7pm - Pager - [phone]  After 7pm go to [email or web address] - password [email or web address]   Thank you for the consult, we will follow the patient with you in the [facility]   Office  [phone]

## 2018-08-22 NOTE — Progress Notes (Signed)
CRITICAL VALUE ALERT  Critical Value:  Calcium 6.0  Date & Time Notied:  08/22/2018, 0425  Provider Notified: Surgeon On call for [facility]   Orders Received/Actions taken: Awaiting orders

## 2018-08-22 NOTE — Progress Notes (Addendum)
1 Day Post-Op    CC: Abdominal pain  Subjective: He is having a lot of pain this a.m.  He is also unhappy being this close to the monitor, and all the noise.  His incision looks okay there are no bowel sounds.  Green bilious drainage from his NG.  Objective: Vital signs in last 24 hours: Temp:  [96.2 F (35.7 C)-98.2 F (36.8 C)] 97.7 F (36.5 C) (10/03 0431) Pulse Rate:  [54-76] 66 (10/03 0700) Resp:  [8-22] 16 (10/03 0700) BP: (119-164)/(45-89) 139/51 (10/03 0700) SpO2:  [92 %-100 %] 100 % (10/03 0700) Weight:  [54.4 kg-55.2 kg] 55.2 kg (10/02 1441) Last BM Date: 08/20/18 NPO 2707 IVV 1800 urine NG 1375 Aebrile, VSS sats good on 2 liters [place] Creatinine is stable at 1.36 CBC is stable Mag was 1.3 yesterday will recheck now Baseline BNP on adimit 366 No films I don't see anything on Telemetry except some PVC's Intake/Output from previous day: 10/02 0701 - 10/03 0700 In: 2707.1 [I.V.:2498.8; IV Piggyback:208.3] Out: 3200 [Urine:1800; Emesis/NG output:1375; Blood:25] Intake/Output this shift: No intake/output data recorded.  General appearance: alert, cooperative and no distress Resp: clear to auscultation bilaterally GI: He is extremely tender and having a fair amount of pain.  Some mild staining on the honeycomb dressing from skin bleeding but otherwise stable.  No bowel sounds, no flatus.  Lab Results:  Recent Labs    08/21/18 0237 08/22/18 0336  WBC 8.2 10.4  HGB 12.2* 11.6*  HCT 37.9* 37.0*  PLT 185 163    BMET Recent Labs    08/21/18 0237 08/22/18 0336  NA 142 141  K 3.6 3.9  CL 109 110  CO2 21* 20*  GLUCOSE 102* 105*  BUN 24* 19  CREATININE 1.67* 1.36*  CALCIUM 6.1* 6.0*   PT/INR Recent Labs    08/21/18 0938  LABPROT 13.5  INR 1.04    Recent Labs  Lab 08/21/18 0237  AST 25  ALT 16  ALKPHOS 102  BILITOT 0.7  PROT 7.4  ALBUMIN 4.0     Lipase     Component Value Date/Time   LIPASE 27 08/21/2018 0237     Medications: .  metoprolol tartrate  2.5 mg Intravenous Q6H   . 0.9 % NaCl with KCl 20 mEq / L 100 mL/hr at 08/22/18 0548  . calcium gluconate     Anti-infectives (From admission, onward)   Start     Dose/Rate Route Frequency Ordered Stop   08/21/18 0900  cefoTEtan (CEFOTAN) 2 g in sodium chloride 0.9 % 100 mL IVPB     2 g 200 mL/hr over 30 Minutes Intravenous On call to O.R. 08/21/18 0848 08/21/18 1202   08/21/18 0630  piperacillin-tazobactam (ZOSYN) IVPB 3.375 g     3.375 g 100 mL/hr over 30 Minutes Intravenous  Once 08/21/18 0626 08/21/18 0724      Assessment/Plan Chronic myelocytic leukemia -on oral therapy Coronary artery disease with prior LAD stent Hx of systolic CHF(EF 30 to 38%, grade 1 diastolic dysfunction, mild to moderate AR) COPD/emphysema - 44-pack-year history of tobacco use; quit in his 74s Carotid artery stenosis with prior right carotid endarterectomy Hx chronic kidney disease stage III   Recurrent midgut volvulus; Status post multiple abdominal surgeries including exploratory laparotomy reduction of small bowel volvulus 11/09/2017, 10/14/2017 and 01/10/2018. Exploratory laparotomy, reduction of midgut volvulus, entero-pexy, 08/21/2018, Dr. Marland Kitchen [place]  FEN: IV fluids/n.p.o. ID: Cefotetan/Zosyn preop DVT: SCDs and Lovenox tonight. Follow up:  Dr. Excell Seltzer  Plan: His  blood pressure is somewhat elevated but I think it is mostly because he is so uncomfortable.  I have increased his fentanyl, added IV Tylenol and Robaxin.  Plan to keep him in the unit today, we will work on controlling his pain.  I will plan to get him out of bed today once his pain is controlled.  I plan to leave the Foley in today.  I have asked Medicine to see and help manage medical issues listed above.  Repeat magnesium 1.6 - will replace, CA replaced last PM.    LOS: 1 day    Darleny Sem 08/22/2018 [phone]

## 2018-08-22 NOTE — Progress Notes (Signed)
ANTICOAGULATION CONSULT NOTE - Initial Consult  Pharmacy Consult for Lovenox Indication: VTE prophylaxis  Allergies  Allergen Reactions  . Percocet [Oxycodone-Acetaminophen] Itching  . Metronidazole Other (See Comments)    Insomnia and nervousness    Patient Measurements: Height: 5\' 9"  (175.3 cm) Weight: 121 lb 11.1 oz (55.2 kg) IBW/kg (Calculated) : 70.7  Vital Signs: Temp: 97.7 F (36.5 C) (10/03 0752) Temp Source: Oral (10/03 0752) BP: 139/51 (10/03 0700) Pulse Rate: 66 (10/03 0700)  Labs: Recent Labs    08/21/18 0237 08/21/18 0938 08/22/18 0336  HGB 12.2*  --  11.6*  HCT 37.9*  --  37.0*  PLT 185  --  163  APTT  --  34  --   LABPROT  --  13.5  --   INR  --  1.04  --   CREATININE 1.67*  --  1.36*    Estimated Creatinine Clearance: 31 mL/min (A) (by C-G formula based on SCr of 1.36 mg/dL (H)).  Medications:  Scheduled:  . metoprolol tartrate  2.5 mg Intravenous Q6H   Infusions:  . 0.9 % NaCl with KCl 20 mEq / L 100 mL/hr at 08/22/18 0548  . acetaminophen 1,000 mg (08/22/18 0954)  . calcium gluconate 1 GM IVPB    . methocarbamol (ROBAXIN) IV Stopped (08/22/18 8016)    Assessment: 89 yoM admitted on 10/2 with abdominal pain, found to have multiple recurrent episodes of midgut volvulus.  S/p exploratory laparotomy for reduction of midgut volvulus, entero-pexy, on 10/2.  Pharmacy is consulted for prophylactic Lovenox dosing to start on 10/3 after 17:00.  Weight 55 kg SCr 1.36 (improved from SCr 1.6 yesterday), CrCl ~ 31 ml/min CBC:  Hgb 11.6, Plt 163 No bleeding reported from incision site.  Goal of Therapy:  Monitor platelets by anticoagulation protocol: Yes   Plan:  Lovenox 40 mg [place] q24h, first dose at 18:00 Follow up renal function closely.   Gretta Arab PharmD, BCPS Pager [phone] 08/22/2018 10:57 AM

## 2018-08-22 NOTE — Progress Notes (Signed)
Initial Nutrition Assessment  DOCUMENTATION CODES:   Underweight(Will assess for malnutrition at follow-up.)  INTERVENTION:  - Diet advancement as medically feasible. - Will monitor for needs for nutrition support.  - Will perform NFPE at follow-up.   NUTRITION DIAGNOSIS:   Inadequate oral intake related to inability to eat as evidenced by NPO status.  GOAL:   Patient will meet greater than or equal to 90% of their needs  MONITOR:   Diet advancement, Weight trends, Labs, Skin  REASON FOR ASSESSMENT:   Other (Comment)(underweight BMI)  ASSESSMENT:   84 year old male with CAD with prior LAD stent, CHF, CKD stage 3, COPD/emphysema, chronic myelocytic leukemia, on oral chemotherapy daily. Surgical hx inclusive of cholecystectomy, small bowel repair, hiatal hernia repair, and inguinal hernia repair. He was first admitted in 07/2013 with SBO. In 09/2017 he was hospitalized for small bowel mid-gut volvulus. We saw him again on 10/14/17 at which time he presented with a small bowel midgut volvulus; underwent ex lap and cecopexy. He returned in 10/2017, again with a small bowel volvulus.; underwent ex lap and reduction of small bowel volvulus. He also returned in 12/2017 with recurrent mid-gut volvulus; underwent ex lap, LOA, and decompression. Patient reports doing well, except for some gas and abdominal distention at home over the last 6 months.  On 10/1 afternoon he began having abdominal pain which became progressively worse. Associated with nausea and one episode of vomiting. CT scan showed small bowel mesenteric swirling concerning for recurrent mid-gut volvulus. There is a small volume of abdominal and mesenteric ascites. Bilateral non-obstructing nephrolithiasis, chronic UPJ obstruction appearance of the kidney.   Patient has been NPO since admission. NGT to LIS with 475 mL out overnight. Patient reports not feeling well. He states that he is having some abdominal discomfort. The past  thing he ate was a cup of yogurt yesterday evening. Patient then politely states that he is not up to having a conversation with RD and would prefer to be allowed to rest and to talk on a later date.   Unable to perform NFPE at this time. Per chart review, weight was stable from 10/2017-03/2018 and then increased from from 03/2018-05/2018 and then has been stable since that time. Suspect some degree of malnutrition based on cachectic appearance.   Recurrent small intestine volvulus POD #1 ex lap with reduction of mid-gut volvulus and enteropexy,   Medications reviewed; 1 g Ca gluconate x2 doses today, 2 g Mg sulfate x1 run today, 1 g Mg sulfate x1 run yesterday. Labs reviewed; creatinine: 1.36 mg/dL, Ca: 6 mg/dL, Mg: 1.6 mg/dL, GFR: 46 mL/min.  IVF; NS-20 mEq IV KCl @ 100 mL/hr.     NUTRITION - FOCUSED PHYSICAL EXAM:  Unable to complete during this visit.   Diet Order:   Diet Order            Diet NPO time specified Except for: Ice Chips  Diet effective now              EDUCATION NEEDS:   Not appropriate for education at this time  Skin:  Skin Assessment: Skin Integrity Issues: Skin Integrity Issues:: Incisions Incisions: abdominal (10/2)  Last BM:  10/1  Height:   Ht Readings from Last 1 Encounters:  08/21/18 5\' 9"  (1.753 m)    Weight:   Wt Readings from Last 1 Encounters:  08/21/18 55.2 kg    Ideal Body Weight:  72.73 kg  BMI:  Body mass index is 17.97 kg/m.  Estimated Nutritional Needs:  Kcal:  1820-2040 (33-37 kcal/kg)  Protein:  83-100 grams (1.5-1.8 grams/kg)  Fluid:  >/= 1.8 L/day     Jarome Matin, MS, RD, LDN, [facility] Inpatient Clinical Dietitian Pager # [phone] After hours/weekend pager # [phone]

## 2018-08-23 LAB — BASIC METABOLIC PANEL
Anion gap: 11 (ref 5–15)
BUN: 21 mg/dL (ref 8–23)
CO2: 21 mmol/L — ABNORMAL LOW (ref 22–32)
Calcium: 6.7 mg/dL — ABNORMAL LOW (ref 8.9–10.3)
Chloride: 111 mmol/L (ref 98–111)
Creatinine, Ser: 1.21 mg/dL (ref 0.61–1.24)
GFR calc Af Amer: >60 mL/min (ref >60)
GFR calc non Af Amer: 53 mL/min — ABNORMAL LOW (ref >60)
Glucose, Bld: 77 mg/dL (ref 70–99)
Potassium: 4.2 mmol/L (ref 3.5–5.1)
Sodium: 143 mmol/L (ref 135–145)

## 2018-08-23 LAB — CBC
HCT: 35 % — ABNORMAL LOW (ref 39.0–52.0)
Hemoglobin: 10.8 g/dL — ABNORMAL LOW (ref 13.0–17.0)
MCH: 28.5 pg (ref 26.0–34.0)
MCHC: 30.9 g/dL (ref 30.0–36.0)
MCV: 92.3 fL (ref 78.0–100.0)
Platelets: 169 10*3/uL (ref 150–400)
RBC: 3.79 MIL/uL — ABNORMAL LOW (ref 4.22–5.81)
RDW: 18.2 % — ABNORMAL HIGH (ref 11.5–15.5)
WBC: 7.7 10*3/uL (ref 4.0–10.5)

## 2018-08-23 LAB — MAGNESIUM: Magnesium: 2.5 mg/dL — ABNORMAL HIGH (ref 1.7–2.4)

## 2018-08-23 LAB — PHOSPHORUS: Phosphorus: 1.8 mg/dL — ABNORMAL LOW (ref 2.5–4.6)

## 2018-08-23 MED ORDER — FAMOTIDINE IN NACL 20-0.9 MG/50ML-% IV SOLN
20.0000 mg | INTRAVENOUS | Status: DC
  Administered 2018-08-23 – 2018-08-26 (×4): 20 mg via INTRAVENOUS
  Filled 2018-08-23 (×4): qty 50

## 2018-08-23 MED ORDER — ACETAMINOPHEN 10 MG/ML IV SOLN
1000.0000 mg | Freq: Four times a day (QID) | INTRAVENOUS | Status: AC
  Administered 2018-08-23 – 2018-08-24 (×4): 1000 mg via INTRAVENOUS
  Filled 2018-08-23 (×4): qty 100

## 2018-08-23 NOTE — Evaluation (Signed)
Physical Therapy Evaluation Patient Details Name: [name] [name] MRN: [id number] DOB: 07-01-1930 Today's Date: 08/23/2018   History of Present Illness  84 yo male admitted  for recurrent midgut volvulus on 08/21/18. S/P  exploratory lap, CHF, CML, CAD, HH repair, colectomy  Clinical Impression  The patient mobilized to recliner. Stated  That he will ambulate when he has less tubes. Pt admitted with above diagnosis. Pt currently with functional limitations due to the deficits listed below (see PT Problem List).  Pt will benefit from skilled PT to increase their independence and safety with mobility to allow discharge to the venue listed below.        Follow Up Recommendations Home health PT may not require    Equipment Recommendations  None recommended by PT    Recommendations for Other Services   OT    Precautions / Restrictions Precautions Precautions: Fall Precaution Comments: abdominal incision, NG suction      Mobility  Bed Mobility Overal bed mobility: Needs Assistance Bed Mobility: Supine to Sit     Supine to sit: Min assist     General bed mobility comments: gentle assist for trunk upright with HOB raised  Transfers Overall transfer level: Needs assistance Equipment used: 1 person hand held assist Transfers: Sit to/from [facility] Sit to Stand: Min assist Stand pivot transfers: Min assist       General transfer comment: patient declined RW to assist. Used  bed rail and 1 HHA.  Ambulation/Gait             General Gait Details: pt. declined.  Stairs            Wheelchair Mobility    Modified Rankin (Stroke Patients Only)       Balance                                             Pertinent Vitals/Pain Pain Assessment: Faces Faces Pain Scale: Hurts even more Pain Location: "gas pain" Pain Descriptors / Indicators: Cramping Pain Intervention(s): Monitored during session    Home Living Family/patient  expects to be discharged to:: Private residence Living Arrangements: Alone Available Help at Discharge: Family Type of Home: House Home Access: Stairs to enter Entrance Stairs-Rails: Right Entrance Stairs-Number of Steps: 3 Home Layout: Able to live on main level with bedroom/bathroom;Multi-level Home Equipment: Walker - 2 wheels;Bedside commode      Prior Function                 Hand Dominance        Extremity/Trunk Assessment   Upper Extremity Assessment Upper Extremity Assessment: Generalized weakness    Lower Extremity Assessment Lower Extremity Assessment: Generalized weakness    Cervical / Trunk Assessment Cervical / Trunk Assessment: Normal  Communication      Cognition Arousal/Alertness: Awake/alert Behavior During Therapy: WFL for tasks assessed/performed Overall Cognitive Status: Within Functional Limits for tasks assessed                                 General Comments: irritable      General Comments      Exercises     Assessment/Plan    PT Assessment Patient needs continued PT services  PT Problem List Decreased strength;Decreased activity tolerance;Decreased mobility;Pain;Decreased knowledge of precautions;Decreased safety awareness;Decreased knowledge of use of  DME       PT Treatment Interventions DME instruction;Gait training;Therapeutic exercise;Functional mobility training;Therapeutic activities    PT Goals (Current goals can be found in the Care Plan section)  Acute Rehab PT Goals Patient Stated Goal: to walk when I don't have tubes PT Goal Formulation: With patient Time For Goal Achievement: 09/06/18 Potential to Achieve Goals: Good    Frequency Min 3X/week   Barriers to discharge        Co-evaluation               AM-PAC PT "6 Clicks" Daily Activity  Outcome Measure Difficulty turning over in bed (including adjusting bedclothes, sheets and blankets)?: A Lot Difficulty moving from lying on back to  sitting on the side of the bed? : A Lot Difficulty sitting down on and standing up from a chair with arms (e.g., wheelchair, bedside commode, etc,.)?: A Lot Help needed moving to and from a bed to chair (including a wheelchair)?: A Lot Help needed walking in hospital room?: A Lot Help needed climbing 3-5 steps with a railing? : Total 6 Click Score: 11    End of Session   Activity Tolerance: Patient limited by pain Patient left: in chair;with call bell/phone within reach;with chair alarm set Nurse Communication: Mobility status PT Visit Diagnosis: Unsteadiness on feet (R26.81);Pain    Time: 0927-0939 PT Time Calculation (min) (ACUTE ONLY): 12 min   Charges:   PT Evaluation $PT Eval Low Complexity: [place] PT Acute Rehabilitation Services Pager [phone] Office [phone]   Claretha Cooper 08/23/2018, 10:58 AM

## 2018-08-23 NOTE — Care Management Note (Signed)
Case Management Note  Patient Details  Name: [name] MRN: [id number] Date of Birth: September 15, 1930  Subjective/Objective:                  Pod 2 exp. Lap, reduction of midgut volvulos and entro-pexy on [id number]  Action/Plan: Following for progression of care and discharge planning No cm needs present at this time.  Expected Discharge Date:  (unknown)               Expected Discharge Plan:     In-House Referral:     Discharge planning Services     Post Acute Care Choice:    Choice offered to:     DME Arranged:    DME Agency:     HH Arranged:    HH Agency:     Status of Service:     If discussed at [employer], dates discussed:    Additional Comments:  Leeroy Cha, RN 08/23/2018, 9:38 AM

## 2018-08-23 NOTE — Progress Notes (Signed)
2 Days Post-Op    CC: Abdominal pain  Subjective: Feeling better this a.m., pains better controlled.  Abdomen still extremely tender.  He is bled a lot from the midline incision.  The dressing was soaked this was removed and a large amount of malodorous clot was cleaned from the site.  He did get out of bed yesterday.  Medically stable.  Objective: Vital signs in last 24 hours: Temp:  [97.5 F (36.4 C)-97.7 F (36.5 C)] 97.5 F (36.4 C) (10/04 0354) Pulse Rate:  [52-68] 57 (10/04 0730) Resp:  [11-20] 12 (10/04 0730) BP: (124-163)/(46-79) 149/55 (10/04 0700) SpO2:  [90 %-100 %] 94 % (10/04 0730) Last BM Date: 08/20/18 270 p.o. 3000 IV 1050 urine 1800 NG Afebrile vital signs are stable creatinine is improving 1.21, mag is 2.5, potassium 4.2 WBC 7.7 H/H 10.8/35 platelets 169K  Intake/Output from previous day: 10/03 0701 - 10/04 0700 In: 3336.5 [P.O.:270; I.V.:2216.5; IV Piggyback:800] Out: 2850 [Urine:1050; Emesis/NG output:1800] Intake/Output this shift: Total I/O In: 49.9 [I.V.:49.9] Out: -   General appearance: alert, cooperative and no distress Resp: clear to auscultation bilaterally GI: Bloody dressing removed and cleaned.  No bowel sounds hematoma and ecchymosis around the midline.  No bowel sounds, no flatus.  Lab Results:  Recent Labs    08/22/18 0336 08/23/18 0322  WBC 10.4 7.7  HGB 11.6* 10.8*  HCT 37.0* 35.0*  PLT 163 169    BMET Recent Labs    08/22/18 0336 08/23/18 0322  NA 141 143  K 3.9 4.2  CL 110 111  CO2 20* 21*  GLUCOSE 105* 77  BUN 19 21  CREATININE 1.36* 1.21  CALCIUM 6.0* 6.7*   PT/INR Recent Labs    08/21/18 0938  LABPROT 13.5  INR 1.04    Recent Labs  Lab 08/21/18 0237  AST 25  ALT 16  ALKPHOS 102  BILITOT 0.7  PROT 7.4  ALBUMIN 4.0     Lipase     Component Value Date/Time   LIPASE 27 08/21/2018 0237     Medications: . enoxaparin (LOVENOX) injection  40 mg Subcutaneous Q24H  . metoprolol tartrate  2.5 mg  Intravenous Q6H    Assessment/Plan  Chronic myelocytic leukemia-on oral therapy Coronary artery disease with prior LAD stent Hx of systolic CHF(EF 30 to 75%, grade 1 diastolic dysfunction, mild to moderate AR) COPD/emphysema- 44-pack-year history of tobacco use;quit in his 60s Carotid artery stenosis with prior right carotid endarterectomy Hx chronic kidney disease stage III   Recurrent midgut volvulus; Status post multiple abdominal surgeries including exploratory laparotomy reduction of small bowel volvulus 11/09/2017, 10/14/2017 and 01/10/2018. Exploratory laparotomy, reduction of midgut volvulus, entero-pexy, 08/21/2018, Dr. Marland Kitchen [place]  FEN: IV fluids/n.p.o. ID: Cefotetan/Zosyn preop DVT: SCDs and Lovenox tonight. Follow up:  Dr. Excell Seltzer  Plan: DC the Foley, and out of bed more today.  Continue IV pain meds, continue n.p.o. and NG.  If stable he can probably go to the floor in a.m.    LOS: 2 days    Enis Riecke 08/23/2018 [phone]

## 2018-08-23 NOTE — Progress Notes (Signed)
PROGRESS NOTE    [name] [name]   BJS:[id number]  DOB: 11-17-1930  DOA: 08/21/2018 PCP: Drake Leach, MD   Brief Narrative:   [name] [name]  is a 84 y.o. male, with past medical history significant for CKD, CAD, COPD, CML, CAD s/p carotid endarterectomy, dysphonia, CHF with EF 30 to 35%, carotid artery stenosis, history of small bowel obstruction in the past due to Volvulus. He required surgery on 10/14/2017, and 11/10/2017, and 01/10/2018. He presents with complaints of abdominal pain and distention which is determine to be due to another small bowel volvulus as well, he went for expiratory laparotomy, with reduction of midgut volvulus with entero-pexy 08/21/2018 by Dr. Excell Seltzer.  We are consulted for medical management of chronic systolic CHF, CKD3 & CAD.   Subjective: He has no complaints. Feels that he is passing some gas.     Assessment & Plan:   Principal Problem:   Midgut volvulus - recurrent - s/p ex lap/LOA/enteropexy 08/21/2018 - Management per general surgery- currently having large output via NG- cont IVF  Active Problems:      CKD (chronic kidney disease), stage III ([place]) - baseline Cr is about 1.2 with GFR in the 50s - Cr was 1.67 on admission and has improved to 1.21 now    CAD (coronary artery disease)   Chronic systolic CHF (congestive heart failure)  - EF 30-35%, mod pulm HTN, mild to mod Ao insufficiency and mod MR - on IV metoprolol Q 6hrs in place of Coreg - Cozaar and diuretics on hold for now - on 100 cc of continuous fluids - will need to follow I and O closely to prevent fluid overload    Carotid stenosis   COPD - stable - does not use inhalers as outpt- quit smoking many years ago   DVT prophylaxis: Lovenox Code Status: Full code Family Communication:  Disposition Plan: per primary team  Antimicrobials:  Anti-infectives (From admission, onward)   Start     Dose/Rate Route Frequency Ordered Stop   08/21/18 0900  cefoTEtan (CEFOTAN) 2 g in sodium  chloride 0.9 % 100 mL IVPB     2 g 200 mL/hr over 30 Minutes Intravenous On call to O.R. 08/21/18 0848 08/21/18 1202   08/21/18 0630  piperacillin-tazobactam (ZOSYN) IVPB 3.375 g     3.375 g 100 mL/hr over 30 Minutes Intravenous  Once 08/21/18 0626 08/21/18 0724       Objective: Vitals:   08/23/18 0626 08/23/18 0645 08/23/18 0700 08/23/18 0730  BP:   (!) 149/55   Pulse: (!) 56 (!) 56 (!) 56 (!) 57  Resp: 13 14 11 12   Temp:      TempSrc:      SpO2: 96% 96% 90% 94%  Weight:      Height:        Intake/Output Summary (Last 24 hours) at 08/23/2018 0803 Last data filed at 08/23/2018 0730 Gross per 24 hour  Intake 3276.38 ml  Output 2850 ml  Net 426.38 ml   Filed Weights   08/21/18 0921 08/21/18 1441  Weight: 54.4 kg 55.2 kg    Examination: General exam: Appears comfortable  HEENT: PERRLA, oral mucosa moist, no sclera icterus or thrush Respiratory system: Clear to auscultation. Respiratory effort normal. Cardiovascular system: S1 & S2 heard, RRR.   Gastrointestinal system: Abdomen soft, NG tube draining dark liquid, wound on abdomen appears clean Central nervous system: Alert and oriented. No focal neurological deficits. Extremities: No cyanosis, clubbing or edema Skin: No rashes or ulcers  Psychiatry:  Mood & affect appropriate.     Data Reviewed: I have personally reviewed following labs and imaging studies  CBC: Recent Labs  Lab 08/21/18 0237 08/22/18 0336 08/23/18 0322  WBC 8.2 10.4 7.7  NEUTROABS 5.9  --   --   HGB 12.2* 11.6* 10.8*  HCT 37.9* 37.0* 35.0*  MCV 90.7 91.1 92.3  PLT 185 163 191   Basic Metabolic Panel: Recent Labs  Lab 08/21/18 0237 08/21/18 0938 08/22/18 0336 08/23/18 0322  NA 142  --  141 143  K 3.6  --  3.9 4.2  CL 109  --  110 111  CO2 21*  --  20* 21*  GLUCOSE 102*  --  105* 77  BUN 24*  --  19 21  CREATININE 1.67*  --  1.36* 1.21  CALCIUM 6.1*  --  6.0* 6.7*  MG  --  1.3* 1.6* 2.5*  PHOS  --   --   --  1.8*    GFR: Estimated Creatinine Clearance: 34.8 mL/min (by C-G formula based on SCr of 1.21 mg/dL). Liver Function Tests: Recent Labs  Lab 08/21/18 0237  AST 25  ALT 16  ALKPHOS 102  BILITOT 0.7  PROT 7.4  ALBUMIN 4.0   Recent Labs  Lab 08/21/18 0237  LIPASE 27   No results for input(s): AMMONIA in the last 168 hours. Coagulation Profile: Recent Labs  Lab 08/21/18 0938  INR 1.04   Cardiac Enzymes: No results for input(s): CKTOTAL, CKMB, CKMBINDEX, TROPONINI in the last 168 hours. BNP (last 3 results) No results for input(s): PROBNP in the last 8760 hours. HbA1C: No results for input(s): HGBA1C in the last 72 hours. CBG: No results for input(s): GLUCAP in the last 168 hours. Lipid Profile: No results for input(s): CHOL, HDL, LDLCALC, TRIG, CHOLHDL, LDLDIRECT in the last 72 hours. Thyroid Function Tests: No results for input(s): TSH, T4TOTAL, FREET4, T3FREE, THYROIDAB in the last 72 hours. Anemia Panel: No results for input(s): VITAMINB12, FOLATE, FERRITIN, TIBC, IRON, RETICCTPCT in the last 72 hours. Urine analysis:    Component Value Date/Time   COLORURINE YELLOW 08/21/2018 0908   APPEARANCEUR CLEAR 08/21/2018 0908   LABSPEC 1.015 08/21/2018 0908   PHURINE 5.0 08/21/2018 0908   GLUCOSEU NEGATIVE 08/21/2018 0908   HGBUR NEGATIVE 08/21/2018 0908   BILIRUBINUR NEGATIVE 08/21/2018 0908   KETONESUR NEGATIVE 08/21/2018 0908   PROTEINUR 30 (A) 08/21/2018 0908   UROBILINOGEN 0.2 09/06/2015 1935   NITRITE NEGATIVE 08/21/2018 0908   LEUKOCYTESUR NEGATIVE 08/21/2018 0908   Sepsis Labs: @LABRCNTIP (procalcitonin:4,lacticidven:4) ) Recent Results (from the past 240 hour(s))  MRSA PCR Screening     Status: None   Collection Time: 08/22/18  5:16 PM  Result Value Ref Range Status   MRSA by PCR NEGATIVE NEGATIVE Final    Comment:        The GeneXpert MRSA Assay (FDA approved for NASAL specimens only), is one component of a comprehensive MRSA colonization surveillance  program. It is not intended to diagnose MRSA infection nor to guide or monitor treatment for MRSA infections. Performed at [facility], [place] [address], [place], [place] [address]          Radiology Studies: No results found.    Scheduled Meds: . enoxaparin (LOVENOX) injection  40 mg Subcutaneous Q24H  . metoprolol tartrate  2.5 mg Intravenous Q6H   Continuous Infusions: . 0.9 % NaCl with KCl 20 mEq / L 100 mL/hr at 08/23/18 0645  . methocarbamol (ROBAXIN) IV  Stopped (08/23/18 0626)     LOS: 2 days    Time spent in minutes: 35    Debbe Odea, MD [facility] Pager: [email or web address] Password [email or web address] 08/23/2018, 8:03 AM

## 2018-08-24 DIAGNOSIS — N183 Chronic kidney disease, stage 3 (moderate): Secondary | ICD-10-CM

## 2018-08-24 DIAGNOSIS — K562 Volvulus: Principal | ICD-10-CM

## 2018-08-24 DIAGNOSIS — I5022 Chronic systolic (congestive) heart failure: Secondary | ICD-10-CM

## 2018-08-24 LAB — BASIC METABOLIC PANEL
Anion gap: 11 (ref 5–15)
BUN: 19 mg/dL (ref 8–23)
CO2: 19 mmol/L — ABNORMAL LOW (ref 22–32)
Calcium: 6.7 mg/dL — ABNORMAL LOW (ref 8.9–10.3)
Chloride: 116 mmol/L — ABNORMAL HIGH (ref 98–111)
Creatinine, Ser: 1.13 mg/dL (ref 0.61–1.24)
GFR calc Af Amer: >60 mL/min (ref >60)
GFR calc non Af Amer: 57 mL/min — ABNORMAL LOW (ref >60)
Glucose, Bld: 62 mg/dL — ABNORMAL LOW (ref 70–99)
Potassium: 5.1 mmol/L (ref 3.5–5.1)
Sodium: 146 mmol/L — ABNORMAL HIGH (ref 135–145)

## 2018-08-24 LAB — GLUCOSE, CAPILLARY
Glucose-Capillary: 57 mg/dL — ABNORMAL LOW (ref 70–99)
Glucose-Capillary: 46 mg/dL — ABNORMAL LOW (ref 70–99)
Glucose-Capillary: 89 mg/dL (ref 70–99)

## 2018-08-24 MED ORDER — DEXTROSE 50 % IV SOLN
1.0000 | Freq: Once | INTRAVENOUS | Status: AC
  Administered 2018-08-24: 50 mL via INTRAVENOUS
  Filled 2018-08-24: qty 50

## 2018-08-24 MED ORDER — SODIUM CHLORIDE 0.45 % IV SOLN
INTRAVENOUS | Status: DC
  Administered 2018-08-24 – 2018-08-25 (×3): via INTRAVENOUS

## 2018-08-24 NOTE — Progress Notes (Addendum)
PROGRESS NOTE    [name] [name]   KGU:[id number]  DOB: 1930/08/10  DOA: 08/21/2018 PCP: Drake Leach, MD   Brief Narrative:   [name] [name]  is a 84 y.o. male, with past medical history significant for CKD, CAD, COPD, CML, CAD s/p carotid endarterectomy, dysphonia, CHF with EF 30 to 35%, carotid artery stenosis, history of small bowel obstruction in the past due to Volvulus. He required surgery on 10/14/2017, and 11/10/2017, and 01/10/2018. He presents with complaints of abdominal pain and distention which is determine to be due to another small bowel volvulus as well, he went for expiratory laparotomy, with reduction of midgut volvulus with entero-pexy 08/21/2018 by Dr. Excell Seltzer.  We are consulted for medical management of chronic systolic CHF, CKD3 & CAD.   Subjective: He has no complaints. Feels that he is passing some gas.    Assessment & Plan:   Principal Problem:   Midgut volvulus - recurrent - s/p ex lap/LOA/enteropexy 08/21/2018 - Management per general surgery- currently having large output via NG > 1000cc out yesterday- cont IVF  Active Problems:      CKD (chronic kidney disease), stage III ([place]) - baseline Cr is about 1.2 with GFR in the 50s - Cr was 1.67 on admission and has improved to 1.13 now   Hyperkalemia/ Hypernatremia - will change IVF to 1/2 NS without KCL    CAD (coronary artery disease)   Chronic systolic CHF (congestive heart failure)  - EF 30-35%, mod pulm HTN, mild to mod Ao insufficiency and mod MR - on IV metoprolol Q 6hrs in place of Coreg - Cozaar and diuretics on hold for now - on 100 cc of continuous fluids - will need to follow I and O closely to prevent fluid overload  Mild hypoglycemia - likely due to NPO status- the patient is awake, alert and asymptomatic despite low sugars    Carotid stenosis   COPD - stable - does not use inhalers as outpt- quit smoking many years ago   DVT prophylaxis: Lovenox Code Status: Full code Family  Communication:  Disposition Plan: per primary team  Antimicrobials:  Anti-infectives (From admission, onward)   Start     Dose/Rate Route Frequency Ordered Stop   08/21/18 0900  cefoTEtan (CEFOTAN) 2 g in sodium chloride 0.9 % 100 mL IVPB     2 g 200 mL/hr over 30 Minutes Intravenous On call to O.R. 08/21/18 0848 08/21/18 1202   08/21/18 0630  piperacillin-tazobactam (ZOSYN) IVPB 3.375 g     3.375 g 100 mL/hr over 30 Minutes Intravenous  Once 08/21/18 0626 08/21/18 0724       Objective: Vitals:   08/24/18 0800 08/24/18 0900 08/24/18 1000 08/24/18 1100  BP: (!) 143/61 (!) 141/50 (!) 150/55 (!) 143/75  Pulse: 60 (!) 57 (!) 58 62  Resp: 14 13 16  (!) 21  Temp:      TempSrc:      SpO2: 99% 99% 99% (!) 83%  Weight:      Height:        Intake/Output Summary (Last 24 hours) at 08/24/2018 1141 Last data filed at 08/24/2018 1100 Gross per 24 hour  Intake 2739.65 ml  Output 2255 ml  Net 484.65 ml   Filed Weights   08/21/18 0921 08/21/18 1441 08/24/18 0354  Weight: 54.4 kg 55.2 kg 55.7 kg    Examination: General exam: Appears comfortable  HEENT: PERRLA, oral mucosa moist, no sclera icterus or thrush Respiratory system: Clear to auscultation. Respiratory effort normal. Cardiovascular system:  S1 & S2 heard, RRR.   Gastrointestinal system: Abdomen soft, NG tube draining dark liquid, wound on abdomen appears clean Central nervous system: Alert and oriented. No focal neurological deficits. Extremities: No cyanosis, clubbing or edema Skin: No rashes or ulcers Psychiatry:  Mood & affect appropriate.     Data Reviewed: I have personally reviewed following labs and imaging studies  CBC: Recent Labs  Lab 08/21/18 0237 08/22/18 0336 08/23/18 0322  WBC 8.2 10.4 7.7  NEUTROABS 5.9  --   --   HGB 12.2* 11.6* 10.8*  HCT 37.9* 37.0* 35.0*  MCV 90.7 91.1 92.3  PLT 185 163 818   Basic Metabolic Panel: Recent Labs  Lab 08/21/18 0237 08/21/18 0938 08/22/18 0336 08/23/18 0322  08/24/18 0326  NA 142  --  141 143 146*  K 3.6  --  3.9 4.2 5.1  CL 109  --  110 111 116*  CO2 21*  --  20* 21* 19*  GLUCOSE 102*  --  105* 77 62*  BUN 24*  --  19 21 19   CREATININE 1.67*  --  1.36* 1.21 1.13  CALCIUM 6.1*  --  6.0* 6.7* 6.7*  MG  --  1.3* 1.6* 2.5*  --   PHOS  --   --   --  1.8*  --    GFR: Estimated Creatinine Clearance: 37.7 mL/min (by C-G formula based on SCr of 1.13 mg/dL). Liver Function Tests: Recent Labs  Lab 08/21/18 0237  AST 25  ALT 16  ALKPHOS 102  BILITOT 0.7  PROT 7.4  ALBUMIN 4.0   Recent Labs  Lab 08/21/18 0237  LIPASE 27   No results for input(s): AMMONIA in the last 168 hours. Coagulation Profile: Recent Labs  Lab 08/21/18 0938  INR 1.04   Cardiac Enzymes: No results for input(s): CKTOTAL, CKMB, CKMBINDEX, TROPONINI in the last 168 hours. BNP (last 3 results) No results for input(s): PROBNP in the last 8760 hours. HbA1C: No results for input(s): HGBA1C in the last 72 hours. CBG: Recent Labs  Lab 08/24/18 0652 08/24/18 0802 08/24/18 0954  GLUCAP 57* 46* 89   Lipid Profile: No results for input(s): CHOL, HDL, LDLCALC, TRIG, CHOLHDL, LDLDIRECT in the last 72 hours. Thyroid Function Tests: No results for input(s): TSH, T4TOTAL, FREET4, T3FREE, THYROIDAB in the last 72 hours. Anemia Panel: No results for input(s): VITAMINB12, FOLATE, FERRITIN, TIBC, IRON, RETICCTPCT in the last 72 hours. Urine analysis:    Component Value Date/Time   COLORURINE YELLOW 08/21/2018 0908   APPEARANCEUR CLEAR 08/21/2018 0908   LABSPEC 1.015 08/21/2018 0908   PHURINE 5.0 08/21/2018 0908   GLUCOSEU NEGATIVE 08/21/2018 0908   HGBUR NEGATIVE 08/21/2018 0908   BILIRUBINUR NEGATIVE 08/21/2018 0908   KETONESUR NEGATIVE 08/21/2018 0908   PROTEINUR 30 (A) 08/21/2018 0908   UROBILINOGEN 0.2 09/06/2015 1935   NITRITE NEGATIVE 08/21/2018 0908   LEUKOCYTESUR NEGATIVE 08/21/2018 0908   Sepsis  Labs: @LABRCNTIP (procalcitonin:4,lacticidven:4) ) Recent Results (from the past 240 hour(s))  MRSA PCR Screening     Status: None   Collection Time: 08/22/18  5:16 PM  Result Value Ref Range Status   MRSA by PCR NEGATIVE NEGATIVE Final    Comment:        The GeneXpert MRSA Assay (FDA approved for NASAL specimens only), is one component of a comprehensive MRSA colonization surveillance program. It is not intended to diagnose MRSA infection nor to guide or monitor treatment for MRSA infections. Performed at [facility], [address]  [address], [place] [address]          Radiology Studies: No results found.    Scheduled Meds: . enoxaparin (LOVENOX) injection  40 mg Subcutaneous Q24H  . metoprolol tartrate  2.5 mg Intravenous Q6H   Continuous Infusions: . sodium chloride 125 mL/hr at 08/24/18 0817  . famotidine (PEPCID) IV Stopped (08/24/18 1021)  . methocarbamol (ROBAXIN) IV Stopped (08/24/18 0624)     LOS: 3 days    Time spent in minutes: 35    Debbe Odea, MD [facility] Pager: [email or web address] Password [email or web address] 08/24/2018, 11:41 AM

## 2018-08-24 NOTE — Progress Notes (Signed)
     Assessment & Plan: POD#3 - status post ex lap with pexy of midgut volvulus  Passing flatus and active BS - will discontinue NG tube  Sips of clear liquids - no trays  OOB, ambulate  Monitor wound for further bleeding        Earnstine Regal, MD, [facility]       Office: [phone]   Chief Complaint: Midgut volvulus, recurrent  Subjective: Patient in bed watching football, appears comfortable, passing flatus, wants NG out.  Objective: Vital signs in last 24 hours: Temp:  [97.2 F (36.2 C)-99.1 F (37.3 C)] 97.5 F (36.4 C) (10/05 1154) Pulse Rate:  [49-66] 51 (10/05 1300) Resp:  [9-21] 13 (10/05 1300) BP: (128-164)/(50-75) 129/53 (10/05 1300) SpO2:  [83 %-100 %] 100 % (10/05 1300) Weight:  [55.7 kg] 55.7 kg (10/05 0354) Last BM Date: 08/20/18  Intake/Output from previous day: 10/04 0701 - 10/05 0700 In: 1099.9 [I.V.:949.9; IV Piggyback:150] Out: 4536 [Urine:1250; Emesis/NG output:1800] Intake/Output this shift: Total I/O In: 2264.5 [I.V.:1721.5; IV Piggyback:543] Out: 280 [Urine:280]  Physical Exam: HEENT - sclerae clear, mucous membranes moist Neck - soft Chest - clear bilaterally Cor - RRR Abdomen - soft, active BS present; midline dressing with small sanguinous Ext - no edema, non-tender Neuro - alert & oriented, no focal deficits  Lab Results:  Recent Labs    08/22/18 0336 08/23/18 0322  WBC 10.4 7.7  HGB 11.6* 10.8*  HCT 37.0* 35.0*  PLT 163 169   BMET Recent Labs    08/23/18 0322 08/24/18 0326  NA 143 146*  K 4.2 5.1  CL 111 116*  CO2 21* 19*  GLUCOSE 77 62*  BUN 21 19  CREATININE 1.21 1.13  CALCIUM 6.7* 6.7*   PT/INR No results for input(s): LABPROT, INR in the last 72 hours. Comprehensive Metabolic Panel:    Component Value Date/Time   NA 146 (H) 08/24/2018 0326   NA 143 08/23/2018 0322   K 5.1 08/24/2018 0326   K 4.2 08/23/2018 0322   CL 116 (H) 08/24/2018 0326   CL 111 08/23/2018 0322   CO2 19 (L) 08/24/2018 0326   CO2 21 (L) 08/23/2018 0322   BUN 19 08/24/2018 0326   BUN 21 08/23/2018 0322   CREATININE 1.13 08/24/2018 0326   CREATININE 1.21 08/23/2018 0322   CREATININE 1.65 (H) 02/28/2016 0958   CREATININE 1.57 (H) 02/11/2016 0928   GLUCOSE 62 (L) 08/24/2018 0326   GLUCOSE 77 08/23/2018 0322   CALCIUM 6.7 (L) 08/24/2018 0326   CALCIUM 6.7 (L) 08/23/2018 0322   AST 25 08/21/2018 0237   AST 38 02/28/2018 0422   ALT 16 08/21/2018 0237   ALT 29 02/28/2018 0422   ALKPHOS 102 08/21/2018 0237   ALKPHOS 113 02/28/2018 0422   BILITOT 0.7 08/21/2018 0237   BILITOT 0.4 02/28/2018 0422   PROT 7.4 08/21/2018 0237   PROT 6.5 02/28/2018 0422   ALBUMIN 4.0 08/21/2018 0237   ALBUMIN 3.2 (L) 02/28/2018 0422    Studies/Results: No results found.    [name] [name] 08/24/2018  [name], male   DOB: 1930/10/14, 84 y.o.   MRN: [id number]

## 2018-08-25 LAB — BASIC METABOLIC PANEL
Anion gap: 4 — ABNORMAL LOW (ref 5–15)
BUN: 14 mg/dL (ref 8–23)
CO2: 22 mmol/L (ref 22–32)
Calcium: 6.5 mg/dL — ABNORMAL LOW (ref 8.9–10.3)
Chloride: 113 mmol/L — ABNORMAL HIGH (ref 98–111)
Creatinine, Ser: 1.06 mg/dL (ref 0.61–1.24)
GFR calc Af Amer: >60 mL/min (ref >60)
GFR calc non Af Amer: >60 mL/min (ref >60)
Glucose, Bld: 99 mg/dL (ref 70–99)
Potassium: 3.9 mmol/L (ref 3.5–5.1)
Sodium: 139 mmol/L (ref 135–145)

## 2018-08-25 LAB — RETICULOCYTES
RBC.: 3.62 MIL/uL — ABNORMAL LOW (ref 4.22–5.81)
Retic Count, Absolute: 65.2 10*3/uL (ref 19.0–186.0)
Retic Ct Pct: 1.8 % (ref 0.4–3.1)

## 2018-08-25 LAB — CBC
HCT: 28.4 % — ABNORMAL LOW (ref 39.0–52.0)
Hemoglobin: 8.8 g/dL — ABNORMAL LOW (ref 13.0–17.0)
MCH: 28.9 pg (ref 26.0–34.0)
MCHC: 31 g/dL (ref 30.0–36.0)
MCV: 93.4 fL (ref 78.0–100.0)
Platelets: 162 10*3/uL (ref 150–400)
RBC: 3.04 MIL/uL — ABNORMAL LOW (ref 4.22–5.81)
RDW: 18.4 % — ABNORMAL HIGH (ref 11.5–15.5)
WBC: 6.9 10*3/uL (ref 4.0–10.5)

## 2018-08-25 LAB — IRON AND TIBC
Iron: 58 ug/dL (ref 45–182)
Saturation Ratios: 25 % (ref 17.9–39.5)
TIBC: 232 ug/dL — ABNORMAL LOW (ref 250–450)
UIBC: 174 ug/dL

## 2018-08-25 LAB — FERRITIN: Ferritin: 865 ng/mL — ABNORMAL HIGH (ref 24–336)

## 2018-08-25 MED ORDER — SODIUM CHLORIDE 0.9 % IV SOLN
INTRAVENOUS | Status: DC
  Administered 2018-08-25 – 2018-08-26 (×2): via INTRAVENOUS

## 2018-08-25 MED ORDER — METOPROLOL TARTRATE 5 MG/5ML IV SOLN
2.5000 mg | Freq: Three times a day (TID) | INTRAVENOUS | Status: DC
  Administered 2018-08-25: 2.5 mg via INTRAVENOUS
  Filled 2018-08-25: qty 5

## 2018-08-25 NOTE — Progress Notes (Signed)
PROGRESS NOTE    [name] [name]   FXJ:[id number]  DOB: [birth year]  DOA: 08/21/2018 PCP: Drake Leach, MD   Brief Narrative:   [name] [name]  is a 84 y.o. male, with past medical history significant for CKD, CAD, COPD, CML, CAD s/p carotid endarterectomy, dysphonia, CHF with EF 30 to 35%, carotid artery stenosis, history of small bowel obstruction in the past due to Volvulus. He required surgery on 10/14/2017, and 11/10/2017, and 01/10/2018. He presents with complaints of abdominal pain and distention which is determine to be due to another small bowel volvulus as well, he went for expiratory laparotomy, with reduction of midgut volvulus with entero-pexy 08/21/2018 by Dr. Excell Seltzer.  We are consulted for medical management of chronic systolic CHF, CKD3 & CAD.   Subjective: Tolerating clear liquids. Passing gas. Pain in abdomen is controlled with medications.    Assessment & Plan:   Principal Problem:   Midgut volvulus - recurrent - s/p ex lap/LOA/enteropexy 08/21/2018 - Management per general surgery- on clears now  Active Problems:      CKD (chronic kidney disease), stage III ([place]) - baseline Cr is about 1.2 with GFR in the 50s - Cr was 1.67 on admission and has improved to 1.06 now  Anemia - likely due to Acute blood loss and IVF hydration- follow up on anemia panel  Hyperkalemia/ Hypernatremia - resolved    CAD (coronary artery disease)   Chronic systolic CHF (congestive heart failure)  - EF 30-35%, mod pulm HTN, mild to mod Ao insufficiency and mod MR - on IV metoprolol Q 6hrs in place of Coreg - Cozaar and diuretics on hold for now - have decreased rate of IVF    Mild hypoglycemia - likely due to NPO status- the patient is awake, alert and asymptomatic despite low sugars - now is eating- sugars improved    Carotid stenosis   COPD - stable - does not use inhalers as outpt- quit smoking many years ago   DVT prophylaxis: Lovenox Code Status: Full code Family  Communication:  Disposition Plan: per primary team  Antimicrobials:  Anti-infectives (From admission, onward)   Start     Dose/Rate Route Frequency Ordered Stop   08/21/18 0900  cefoTEtan (CEFOTAN) 2 g in sodium chloride 0.9 % 100 mL IVPB     2 g 200 mL/hr over 30 Minutes Intravenous On call to O.R. 08/21/18 0848 08/21/18 1202   08/21/18 0630  piperacillin-tazobactam (ZOSYN) IVPB 3.375 g     3.375 g 100 mL/hr over 30 Minutes Intravenous  Once 08/21/18 0626 08/21/18 0724       Objective: Vitals:   08/25/18 0857 08/25/18 0900 08/25/18 1000 08/25/18 1200  BP:      Pulse: 65 63 61   Resp: 17 16 18    Temp:    98 F (36.7 C)  TempSrc:    Oral  SpO2: 99% 100% 100%   Weight:      Height:        Intake/Output Summary (Last 24 hours) at 08/25/2018 1343 Last data filed at 08/25/2018 1316 Gross per 24 hour  Intake 2046.63 ml  Output 1325 ml  Net 721.63 ml   Filed Weights   08/21/18 1441 08/24/18 0354 08/25/18 0410  Weight: 55.2 kg 55.7 kg 58.3 kg    Examination: General exam: Appears comfortable  HEENT: PERRLA, oral mucosa moist, no sclera icterus or thrush Respiratory system: Clear to auscultation. Respiratory effort normal. Cardiovascular system: S1 & S2 heard, RRR.   Gastrointestinal system: Abdomen  soft, mildly tender, wound on abdomen appears clean Central nervous system: Alert and oriented. No focal neurological deficits. Extremities: No cyanosis, clubbing or edema Skin: No rashes or ulcers Psychiatry:  Mood & affect appropriate.     Data Reviewed: I have personally reviewed following labs and imaging studies  CBC: Recent Labs  Lab 08/21/18 0237 08/22/18 0336 08/23/18 0322 08/25/18 0342  WBC 8.2 10.4 7.7 6.9  NEUTROABS 5.9  --   --   --   HGB 12.2* 11.6* 10.8* 8.8*  HCT 37.9* 37.0* 35.0* 28.4*  MCV 90.7 91.1 92.3 93.4  PLT 185 163 169 993   Basic Metabolic Panel: Recent Labs  Lab 08/21/18 0237 08/21/18 0938 08/22/18 0336 08/23/18 0322  08/24/18 0326 08/25/18 0346  NA 142  --  141 143 146* 139  K 3.6  --  3.9 4.2 5.1 3.9  CL 109  --  110 111 116* 113*  CO2 21*  --  20* 21* 19* 22  GLUCOSE 102*  --  105* 77 62* 99  BUN 24*  --  19 21 19 14   CREATININE 1.67*  --  1.36* 1.21 1.13 1.06  CALCIUM 6.1*  --  6.0* 6.7* 6.7* 6.5*  MG  --  1.3* 1.6* 2.5*  --   --   PHOS  --   --   --  1.8*  --   --    GFR: Estimated Creatinine Clearance: 42 mL/min (by C-G formula based on SCr of 1.06 mg/dL). Liver Function Tests: Recent Labs  Lab 08/21/18 0237  AST 25  ALT 16  ALKPHOS 102  BILITOT 0.7  PROT 7.4  ALBUMIN 4.0   Recent Labs  Lab 08/21/18 0237  LIPASE 27   No results for input(s): AMMONIA in the last 168 hours. Coagulation Profile: Recent Labs  Lab 08/21/18 0938  INR 1.04   Cardiac Enzymes: No results for input(s): CKTOTAL, CKMB, CKMBINDEX, TROPONINI in the last 168 hours. BNP (last 3 results) No results for input(s): PROBNP in the last 8760 hours. HbA1C: No results for input(s): HGBA1C in the last 72 hours. CBG: Recent Labs  Lab 08/24/18 0652 08/24/18 0802 08/24/18 0954  GLUCAP 57* 46* 89   Lipid Profile: No results for input(s): CHOL, HDL, LDLCALC, TRIG, CHOLHDL, LDLDIRECT in the last 72 hours. Thyroid Function Tests: No results for input(s): TSH, T4TOTAL, FREET4, T3FREE, THYROIDAB in the last 72 hours. Anemia Panel: No results for input(s): VITAMINB12, FOLATE, FERRITIN, TIBC, IRON, RETICCTPCT in the last 72 hours. Urine analysis:    Component Value Date/Time   COLORURINE YELLOW 08/21/2018 0908   APPEARANCEUR CLEAR 08/21/2018 0908   LABSPEC 1.015 08/21/2018 0908   PHURINE 5.0 08/21/2018 0908   GLUCOSEU NEGATIVE 08/21/2018 0908   HGBUR NEGATIVE 08/21/2018 0908   BILIRUBINUR NEGATIVE 08/21/2018 0908   KETONESUR NEGATIVE 08/21/2018 0908   PROTEINUR 30 (A) 08/21/2018 0908   UROBILINOGEN 0.2 09/06/2015 1935   NITRITE NEGATIVE 08/21/2018 0908   LEUKOCYTESUR NEGATIVE 08/21/2018 0908   Sepsis  Labs: @LABRCNTIP (procalcitonin:4,lacticidven:4) ) Recent Results (from the past 240 hour(s))  MRSA PCR Screening     Status: None   Collection Time: 08/22/18  5:16 PM  Result Value Ref Range Status   MRSA by PCR NEGATIVE NEGATIVE Final    Comment:        The GeneXpert MRSA Assay (FDA approved for NASAL specimens only), is one component of a comprehensive MRSA colonization surveillance program. It is not intended to diagnose MRSA infection nor to guide or monitor  treatment for MRSA infections. Performed at [facility], [place] [address], [place], [place] [address]          Radiology Studies: No results found.    Scheduled Meds: . enoxaparin (LOVENOX) injection  40 mg Subcutaneous Q24H  . metoprolol tartrate  2.5 mg Intravenous Q6H   Continuous Infusions: . sodium chloride 75 mL/hr at 08/25/18 1316  . famotidine (PEPCID) IV Stopped (08/25/18 0959)  . methocarbamol (ROBAXIN) IV Stopped (08/25/18 0555)     LOS: 4 days    Time spent in minutes: 35    Debbe Odea, MD [facility] Pager: [email or web address] Password [email or web address] 08/25/2018, 1:43 PM

## 2018-08-25 NOTE — Progress Notes (Signed)
[name], male   DOB: [birth year], 84 y.o.   MRN: [id number]     Assessment & Plan: POD#4 - status post ex lap with pexy of midgut volvulus             Passing flatus and active BS - no BM yet - NG out yesterday             Begin clear liquid diet today             OOB, ambulate             Monitor wound for further bleeding - change dressing daily        Earnstine Regal, MD, [facility]       Office: [phone]   Chief Complaint: Recurrent midgut volvulus  Subjective: Patient in bed, comfortable, some gas pains, no BM yet.  Objective: Vital signs in last 24 hours: Temp:  [97.2 F (36.2 C)-98.1 F (36.7 C)] 98.1 F (36.7 C) (10/06 0347) Pulse Rate:  [45-64] 53 (10/06 0600) Resp:  [12-21] 16 (10/06 0600) BP: (101-170)/(41-75) 121/43 (10/06 0600) SpO2:  [83 %-100 %] 93 % (10/06 0600) Weight:  [58.3 kg] 58.3 kg (10/06 0410) Last BM Date: 08/20/18  Intake/Output from previous day: 10/05 0701 - 10/06 0700 In: 3189.5 [P.O.:300; I.V.:2346.5; IV [id number]:625] Out: 6389 [Urine:905; Emesis/NG output:700] Intake/Output this shift: No intake/output data recorded.  Physical Exam: HEENT - sclerae clear, mucous membranes moist Neck - soft Chest - clear bilaterally Cor - RRR Abdomen - soft, mild distension; dressing dry and intact; BS present Ext - no edema, non-tender Neuro - alert & oriented, no focal deficits  Lab Results:  Recent Labs    08/23/18 0322  WBC 7.7  HGB 10.8*  HCT 35.0*  PLT 169   BMET Recent Labs    08/24/18 0326 08/25/18 0346  NA 146* 139  K 5.1 3.9  CL 116* 113*  CO2 19* 22  GLUCOSE 62* 99  BUN 19 14  CREATININE 1.13 1.06  CALCIUM 6.7* 6.5*   PT/INR No results for input(s): LABPROT, INR in the last 72 hours. Comprehensive Metabolic Panel:    Component Value Date/Time   NA 139 08/25/2018 0346   NA 146 (H) 08/24/2018 0326   K 3.9 08/25/2018 0346   K 5.1 08/24/2018 0326   CL 113 (H)  08/25/2018 0346   CL 116 (H) 08/24/2018 0326   CO2 22 08/25/2018 0346   CO2 19 (L) 08/24/2018 0326   BUN 14 08/25/2018 0346   BUN 19 08/24/2018 0326   CREATININE 1.06 08/25/2018 0346   CREATININE 1.13 08/24/2018 0326   CREATININE 1.65 (H) 02/28/2016 0958   CREATININE 1.57 (H) 02/11/2016 0928   GLUCOSE 99 08/25/2018 0346   GLUCOSE 62 (L) 08/24/2018 0326   CALCIUM 6.5 (L) 08/25/2018 0346   CALCIUM 6.7 (L) 08/24/2018 0326   AST 25 08/21/2018 0237   AST 38 02/28/2018 0422   ALT 16 08/21/2018 0237   ALT 29 02/28/2018 0422   ALKPHOS 102 08/21/2018 0237   ALKPHOS 113 02/28/2018 0422   BILITOT 0.7 08/21/2018 0237   BILITOT 0.4 02/28/2018 0422   PROT 7.4 08/21/2018 0237   PROT 6.5 02/28/2018 0422   ALBUMIN 4.0 08/21/2018 0237   ALBUMIN 3.2 (L) 02/28/2018 0422    Studies/Results: No results found.    [name] [name] 08/25/2018

## 2018-08-25 NOTE — Progress Notes (Signed)
ANTICOAGULATION CONSULT NOTE - Initial Consult  Pharmacy Consult for Lovenox Indication: VTE prophylaxis  Allergies  Allergen Reactions  . Percocet [Oxycodone-Acetaminophen] Itching  . Metronidazole Other (See Comments)    Insomnia and nervousness    Patient Measurements: Height: 5\' 9"  (175.3 cm) Weight: 128 lb 8.5 oz (58.3 kg) IBW/kg (Calculated) : 70.7  Vital Signs: Temp: 98 F (36.7 C) (10/06 1200) Temp Source: Oral (10/06 1200) BP: 104/45 (10/06 0800) Pulse Rate: 61 (10/06 1000)  Labs: Recent Labs    08/23/18 0322 08/24/18 0326 08/25/18 0342 08/25/18 0346  HGB 10.8*  --  8.8*  --   HCT 35.0*  --  28.4*  --   PLT 169  --  162  --   CREATININE 1.21 1.13  --  1.06    Estimated Creatinine Clearance: 42 mL/min (by C-G formula based on SCr of 1.06 mg/dL).  Medications:  Scheduled:  . enoxaparin (LOVENOX) injection  40 mg Subcutaneous Q24H  . metoprolol tartrate  2.5 mg Intravenous Q6H   Infusions:  . sodium chloride 75 mL/hr at 08/25/18 1316  . famotidine (PEPCID) IV Stopped (08/25/18 0959)  . methocarbamol (ROBAXIN) IV Stopped (08/25/18 0555)    Assessment: 88 yoM admitted on 10/2 with abdominal pain, found to have multiple recurrent episodes of midgut volvulus.  S/p exploratory laparotomy for reduction of midgut volvulus, entero-pexy, on 10/2.  Pharmacy is consulted for prophylactic Lovenox dosing to start on 10/3 after 17:00.  Today, 08/25/18  Weight 58 kg  SCr 1.06 (improved and stable), CrCl ~ 42 ml/min  CBC:  Hgb 8.8 - decreased  Plt 162 - stable  Spoke with RN - dressing changed. Some dried blood on dressing. Small amount of oozing from incision site. Discussed this and Hgb with surgeon on call - ok to continue enoxaparin   Goal of Therapy:  Monitor platelets by anticoagulation protocol: Yes   Plan:  Enoxaparin 40 mg subQ daily Will sign off as renal function stable  Theodis Shove, PharmD, BCPS Clinical Pharmacist 08/25/18 1:48  PM

## 2018-08-26 DIAGNOSIS — I10 Essential (primary) hypertension: Secondary | ICD-10-CM

## 2018-08-26 LAB — FOLATE: Folate: 14.8 ng/mL (ref >5.9)

## 2018-08-26 LAB — BASIC METABOLIC PANEL
Anion gap: 6 (ref 5–15)
BUN: 7 mg/dL — ABNORMAL LOW (ref 8–23)
CO2: 19 mmol/L — ABNORMAL LOW (ref 22–32)
Calcium: 6.6 mg/dL — ABNORMAL LOW (ref 8.9–10.3)
Chloride: 115 mmol/L — ABNORMAL HIGH (ref 98–111)
Creatinine, Ser: 0.96 mg/dL (ref 0.61–1.24)
GFR calc Af Amer: >60 mL/min (ref >60)
GFR calc non Af Amer: >60 mL/min (ref >60)
Glucose, Bld: 98 mg/dL (ref 70–99)
Potassium: 4.4 mmol/L (ref 3.5–5.1)
Sodium: 140 mmol/L (ref 135–145)

## 2018-08-26 LAB — CBC
HCT: 35.1 % — ABNORMAL LOW (ref 39.0–52.0)
Hemoglobin: 11.4 g/dL — ABNORMAL LOW (ref 13.0–17.0)
MCH: 29.4 pg (ref 26.0–34.0)
MCHC: 32.5 g/dL (ref 30.0–36.0)
MCV: 90.5 fL (ref 78.0–100.0)
Platelets: 243 10*3/uL (ref 150–400)
RBC: 3.88 MIL/uL — ABNORMAL LOW (ref 4.22–5.81)
RDW: 15.1 % (ref 11.5–15.5)
WBC: 4.5 10*3/uL (ref 4.0–10.5)

## 2018-08-26 LAB — VITAMIN B12: Vitamin B-12: 463 pg/mL (ref 180–914)

## 2018-08-26 MED ORDER — CARVEDILOL 6.25 MG PO TABS
6.2500 mg | ORAL_TABLET | Freq: Two times a day (BID) | ORAL | Status: DC
  Administered 2018-08-26 – 2018-08-27 (×3): 6.25 mg via ORAL
  Filled 2018-08-26 (×4): qty 1

## 2018-08-26 MED ORDER — LOSARTAN POTASSIUM 25 MG PO TABS
12.5000 mg | ORAL_TABLET | Freq: Every day | ORAL | Status: DC
  Administered 2018-08-26 – 2018-08-28 (×3): 12.5 mg via ORAL
  Filled 2018-08-26 (×2): qty 1
  Filled 2018-08-26 (×2): qty 0.5

## 2018-08-26 MED ORDER — SODIUM CHLORIDE 0.9 % IV SOLN
INTRAVENOUS | Status: DC | PRN
  Administered 2018-08-26: 250 mL via INTRAVENOUS

## 2018-08-26 NOTE — Progress Notes (Signed)
[name], male   DOB: Jan 18, 1930, 84 y.o.   MRN: [id number]    5 Days Post-Op  Subjective: No complaints this morning.  Had 4 BMs yesterday.  Voiding well otherwise.  Tolerating clear liquids with no nausea.  Ambulating well.  Objective: Vital signs in last 24 hours: Temp:  [98 F (36.7 C)-98.4 F (36.9 C)] 98 F (36.7 C) (10/07 0800) Pulse Rate:  [58-77] 77 (10/07 0602) Resp:  [13-19] 17 (10/07 0602) BP: (133-176)/(55-79) 176/72 (10/07 0602) SpO2:  [94 %-100 %] 96 % (10/07 0602) Weight:  [57.6 kg] 57.6 kg (10/07 0500) Last BM Date: 08/25/18  Intake/Output from previous day: 10/06 0701 - 10/07 0700 In: 2727.2 [P.O.:1820; I.V.:672.1; IV Piggyback:235.1] Out: -  Intake/Output this shift: No intake/output data recorded.  PE: Heart: regular Lungs: CTAB Abd: soft, appropriately tender, ecchymosis of midline incision, but no active bleeding.  Staples are intact.  Wound intact. +BS, ND  Lab Results:  Recent Labs    08/25/18 0342  WBC 6.9  HGB 8.8*  HCT 28.4*  PLT 162   BMET Recent Labs    08/25/18 0346 08/26/18 0333  NA 139 140  K 3.9 4.4  CL 113* 115*  CO2 22 19*  GLUCOSE 99 98  BUN 14 7*  CREATININE 1.06 0.96  CALCIUM 6.5* 6.6*   PT/INR No results for input(s): LABPROT, INR in the last 72 hours. CMP     Component Value Date/Time   NA 140 08/26/2018 0333   K 4.4 08/26/2018 0333   CL 115 (H) 08/26/2018 0333   CO2 19 (L) 08/26/2018 0333   GLUCOSE 98 08/26/2018 0333   BUN 7 (L) 08/26/2018 0333   CREATININE 0.96 08/26/2018 0333   CREATININE 1.65 (H) 02/28/2016 0958   CALCIUM 6.6 (L) 08/26/2018 0333   PROT 7.4 08/21/2018 0237   ALBUMIN 4.0 08/21/2018 0237   AST 25 08/21/2018 0237   ALT 16 08/21/2018 0237   ALKPHOS 102 08/21/2018 0237   BILITOT 0.7 08/21/2018 0237   GFRNONAA >60 08/26/2018 0333   GFRAA >60 08/26/2018 0333   Lipase     Component Value Date/Time   LIPASE 27 08/21/2018 0237       Studies/Results: No results  found.  Anti-infectives: Anti-infectives (From admission, onward)   Start     Dose/Rate Route Frequency Ordered Stop   08/21/18 0900  cefoTEtan (CEFOTAN) 2 g in sodium chloride 0.9 % 100 mL IVPB     2 g 200 mL/hr over 30 Minutes Intravenous On call to O.R. 08/21/18 0848 08/21/18 1202   08/21/18 0630  piperacillin-tazobactam (ZOSYN) IVPB 3.375 g     3.375 g 100 mL/hr over 30 Minutes Intravenous  Once 08/21/18 0626 08/21/18 0724       Assessment/Plan Chronic myelocytic leukemia-on oral therapy Coronary artery disease with prior LAD stent Hx of systolic CHF(EF 30 to 71%) COPD/emphysema Carotid artery stenosis with prior right carotid endarterectomy Hx chronic kidney disease stage III Anemia - repeat cbc today, hgb 8.8 yesterday  Recurrent midgut volvulus; Exploratory laparotomy, reduction of midgut volvulus, entero-pexy, 08/21/2018, Dr. Excell Seltzer, POD 5  FEN:IV fluids/FLD. GG:YIRSWNIOE/VOJJK preop DVT: SCDs/Lovenox Follow up: Dr. Excell Seltzer  Plan: follow hgb.  Tx to the floor.  Appreciate medicine's assistance.   LOS: 5 days    Henreitta Cea , [facility] 08/26/2018, 8:06 AM Pager: [phone]

## 2018-08-26 NOTE — Progress Notes (Signed)
PT Cancellation Note  Patient Details Name: [name] [name] MRN: [id number] DOB: 30-Nov-1929   Cancelled Treatment:    Reason Eval/Treat Not Completed: Patient declined, patient reports ambulating with staff. Will check back another time, consider DC if up  And no Acute PT needs.    Tresa Endo Baldwin Pager [phone] Office [phone]  08/26/2018, 10:45 AM

## 2018-08-26 NOTE — Progress Notes (Signed)
PROGRESS NOTE    [name] [name]   JME:[id number]  DOB: 02/12/1930  DOA: 08/21/2018 PCP: Drake Leach, MD   Brief Narrative:   [name] [name]  is a 84 y.o. male, with past medical history significant for CKD, CAD, COPD, CML, CAD s/p carotid endarterectomy, dysphonia, CHF with EF 30 to 35%, carotid artery stenosis, history of small bowel obstruction in the past due to Volvulus. He required surgery on 10/14/2017, and 11/10/2017, and 01/10/2018. He presents with complaints of abdominal pain and distention which is determine to be due to another small bowel volvulus as well, he went for expiratory laparotomy, with reduction of midgut volvulus with entero-pexy 08/21/2018 by Dr. Excell Seltzer.  We are consulted for medical management of chronic systolic CHF, CKD3 & CAD.   Subjective: Tolerating clear liquids and having BMs. No new complaints.     Assessment & Plan:   Principal Problem:   Midgut volvulus - recurrent - s/p ex lap/LOA/enteropexy 08/21/2018 - Management per general surgery- on clears now  Active Problems:     CKD (chronic kidney disease), stage III (HCC) - baseline Cr is about 1.2 with GFR in the 50s - Cr was 1.67 on admission and has improved to 0.96 now  Anemia - likely due to Acute blood loss and IVF hydration-  - anemia panel suggestive of AOCD  Hyperkalemia/ Hypernatremia - resolved    CAD (coronary artery disease)   Chronic systolic CHF (congestive heart failure)  - EF 30-35%, mod pulm HTN, mild to mod Ao insufficiency and mod MR - on IV metoprolol Q 6hrs in place of Coreg- as he is tolerating orals now, will transition back to coreg - Cozaar and diuretics on hold - will resume Cozaar today - as he is drinking adequately, will d/c IVF today to prevent fluid overload  Mild hypoglycemia - likely due to NPO status- the patient is awake, alert and asymptomatic despite low sugars - now is eating- sugars improved    Carotid stenosis   COPD - stable - does not use inhalers  as outpt- quit smoking many years ago   DVT prophylaxis: Lovenox Code Status: Full code Family Communication:  Disposition Plan: per primary team  Antimicrobials:  Anti-infectives (From admission, onward)   Start     Dose/Rate Route Frequency Ordered Stop   08/21/18 0900  cefoTEtan (CEFOTAN) 2 g in sodium chloride 0.9 % 100 mL IVPB     2 g 200 mL/hr over 30 Minutes Intravenous On call to O.R. 08/21/18 0848 08/21/18 1202   08/21/18 0630  piperacillin-tazobactam (ZOSYN) IVPB 3.375 g     3.375 g 100 mL/hr over 30 Minutes Intravenous  Once 08/21/18 0626 08/21/18 0724       Objective: Vitals:   08/26/18 0602 08/26/18 0800 08/26/18 0855 08/26/18 1000  BP: (!) 176/72  (!) 161/63 (!) 141/61  Pulse: 77 93 88 68  Resp: 17 (!) 23 15 14   Temp:  98 F (36.7 C)    TempSrc:  Oral    SpO2: 96% 98% 99% 99%  Weight:      Height:        Intake/Output Summary (Last 24 hours) at 08/26/2018 1140 Last data filed at 08/26/2018 1000 Gross per 24 hour  Intake 2621 ml  Output -  Net 2621 ml   Filed Weights   08/24/18 0354 08/25/18 0410 08/26/18 0500  Weight: 55.7 kg 58.3 kg 57.6 kg    Examination: General exam: Appears comfortable  HEENT: PERRLA, oral mucosa moist, no sclera icterus  or thrush Respiratory system: Clear to auscultation. Respiratory effort normal. Cardiovascular system: S1 & S2 heard, RRR.   Gastrointestinal system: Abdomen soft, mildly tender, wound on abdomen appears clean Central nervous system: Alert and oriented. No focal neurological deficits. Extremities: No cyanosis, clubbing or edema Skin: No rashes or ulcers Psychiatry:  Mood & affect appropriate.     Data Reviewed: I have personally reviewed following labs and imaging studies  CBC: Recent Labs  Lab 08/21/18 0237 08/22/18 0336 08/23/18 0322 08/25/18 0342 08/26/18 0333  WBC 8.2 10.4 7.7 6.9 4.5  NEUTROABS 5.9  --   --   --   --   HGB 12.2* 11.6* 10.8* 8.8* 11.4*  HCT 37.9* 37.0* 35.0* 28.4* 35.1*    MCV 90.7 91.1 92.3 93.4 90.5  PLT 185 163 169 162 794   Basic Metabolic Panel: Recent Labs  Lab 08/21/18 0938 08/22/18 0336 08/23/18 0322 08/24/18 0326 08/25/18 0346 08/26/18 0333  NA  --  141 143 146* 139 140  K  --  3.9 4.2 5.1 3.9 4.4  CL  --  110 111 116* 113* 115*  CO2  --  20* 21* 19* 22 19*  GLUCOSE  --  105* 77 62* 99 98  BUN  --  19 21 19 14  7*  CREATININE  --  1.36* 1.21 1.13 1.06 0.96  CALCIUM  --  6.0* 6.7* 6.7* 6.5* 6.6*  MG 1.3* 1.6* 2.5*  --   --   --   PHOS  --   --  1.8*  --   --   --    GFR: Estimated Creatinine Clearance: 45.8 mL/min (by C-G formula based on SCr of 0.96 mg/dL). Liver Function Tests: Recent Labs  Lab 08/21/18 0237  AST 25  ALT 16  ALKPHOS 102  BILITOT 0.7  PROT 7.4  ALBUMIN 4.0   Recent Labs  Lab 08/21/18 0237  LIPASE 27   No results for input(s): AMMONIA in the last 168 hours. Coagulation Profile: Recent Labs  Lab 08/21/18 0938  INR 1.04   Cardiac Enzymes: No results for input(s): CKTOTAL, CKMB, CKMBINDEX, TROPONINI in the last 168 hours. BNP (last 3 results) No results for input(s): PROBNP in the last 8760 hours. HbA1C: No results for input(s): HGBA1C in the last 72 hours. CBG: Recent Labs  Lab 08/24/18 0652 08/24/18 0802 08/24/18 0954  GLUCAP 57* 46* 89   Lipid Profile: No results for input(s): CHOL, HDL, LDLCALC, TRIG, CHOLHDL, LDLDIRECT in the last 72 hours. Thyroid Function Tests: No results for input(s): TSH, T4TOTAL, FREET4, T3FREE, THYROIDAB in the last 72 hours. Anemia Panel: Recent Labs    08/25/18 1430 08/26/18 0333  VITAMINB12  --  463  FOLATE  --  14.8  FERRITIN 865*  --   TIBC 232*  --   IRON 58  --   RETICCTPCT 1.8  --    Urine analysis:    Component Value Date/Time   COLORURINE YELLOW 08/21/2018 0908   APPEARANCEUR CLEAR 08/21/2018 0908   LABSPEC 1.015 08/21/2018 0908   PHURINE 5.0 08/21/2018 0908   GLUCOSEU NEGATIVE 08/21/2018 0908   HGBUR NEGATIVE 08/21/2018 0908    BILIRUBINUR NEGATIVE 08/21/2018 0908   KETONESUR NEGATIVE 08/21/2018 0908   PROTEINUR 30 (A) 08/21/2018 0908   UROBILINOGEN 0.2 09/06/2015 1935   NITRITE NEGATIVE 08/21/2018 0908   LEUKOCYTESUR NEGATIVE 08/21/2018 0908   Sepsis Labs: @LABRCNTIP (procalcitonin:4,lacticidven:4) ) Recent Results (from the past 240 hour(s))  MRSA PCR Screening     Status: None  Collection Time: 08/22/18  5:16 PM  Result Value Ref Range Status   MRSA by PCR NEGATIVE NEGATIVE Final    Comment:        The GeneXpert MRSA Assay (FDA approved for NASAL specimens only), is one component of a comprehensive MRSA colonization surveillance program. It is not intended to diagnose MRSA infection nor to guide or monitor treatment for MRSA infections. Performed at [facility], [place] [address] [place], [place] [address]          Radiology Studies: No results found.    Scheduled Meds: . carvedilol  6.25 mg Oral BID WC  . enoxaparin (LOVENOX) injection  40 mg Subcutaneous Q24H  . losartan  12.5 mg Oral Daily   Continuous Infusions: . famotidine (PEPCID) IV Stopped (08/26/18 0930)  . methocarbamol (ROBAXIN) IV Stopped (08/26/18 0620)     LOS: 5 days    Time spent in minutes: 35    Debbe Odea, MD [facility] Pager: [email or web address] Password [email or web address] 08/26/2018, 11:40 AM

## 2018-08-26 NOTE — Care Management Note (Signed)
Case Management Note  Patient Details  Name: Khyson Sebesta MRN: [id number] Date of Birth: 1930-02-25  Subjective/Objective:                  Pod 5-Exp.lap with reduction of volvulus, entro-pexy  Action/Plan: Will follow for progression of care and clinical status. Will follow for case management needs none present at this time.  Expected Discharge Date:  (unknown)               Expected Discharge Plan:  Home/Self Care  In-House Referral:     Discharge planning Services  CM Consult  Post Acute Care Choice:    Choice offered to:     DME Arranged:    DME Agency:     HH Arranged:    HH Agency:     Status of Service:  In process, will continue to follow  If discussed at Long Length of Stay Meetings, dates discussed:    Additional Comments:  Leeroy Cha, RN 08/26/2018, 8:28 AM

## 2018-08-27 LAB — BASIC METABOLIC PANEL
Anion gap: 4 — ABNORMAL LOW (ref 5–15)
BUN: 5 mg/dL — ABNORMAL LOW (ref 8–23)
CO2: 22 mmol/L (ref 22–32)
Calcium: 6.6 mg/dL — ABNORMAL LOW (ref 8.9–10.3)
Chloride: 116 mmol/L — ABNORMAL HIGH (ref 98–111)
Creatinine, Ser: 0.96 mg/dL (ref 0.61–1.24)
GFR calc Af Amer: >60 mL/min (ref >60)
GFR calc non Af Amer: >60 mL/min (ref >60)
Glucose, Bld: 95 mg/dL (ref 70–99)
Potassium: 4.8 mmol/L (ref 3.5–5.1)
Sodium: 142 mmol/L (ref 135–145)

## 2018-08-27 MED ORDER — ACETAMINOPHEN 500 MG PO TABS: 1000.0000 mg | ORAL_TABLET | Freq: Four times a day (QID) | ORAL | Status: DC | PRN

## 2018-08-27 MED ORDER — FAMOTIDINE 20 MG PO TABS
20.0000 mg | ORAL_TABLET | Freq: Every day | ORAL | Status: DC
  Administered 2018-08-27 – 2018-08-28 (×2): 20 mg via ORAL
  Filled 2018-08-27 (×2): qty 1

## 2018-08-27 MED ORDER — TRAMADOL HCL 50 MG PO TABS
50.0000 mg | ORAL_TABLET | Freq: Four times a day (QID) | ORAL | Status: DC | PRN
  Administered 2018-08-27: 50 mg via ORAL
  Filled 2018-08-27: qty 1

## 2018-08-27 MED ORDER — METHOCARBAMOL 500 MG PO TABS
500.0000 mg | ORAL_TABLET | Freq: Three times a day (TID) | ORAL | Status: DC | PRN
  Filled 2018-08-27: qty 1

## 2018-08-27 NOTE — Progress Notes (Signed)
PROGRESS NOTE    [name] [name]   LNL:[id number]  DOB: Jun 04, 1930  DOA: 08/21/2018 PCP: Drake Leach, MD   Brief Narrative:   [name] [name]  is a 84 y.o. male, with past medical history significant for CKD, CAD, COPD, CML, CAD s/p carotid endarterectomy, dysphonia, CHF with EF 30 to 35%, carotid artery stenosis, history of small bowel obstruction in the past due to Volvulus. He required surgery on 10/14/2017, and 11/10/2017, and 01/10/2018. He presents with complaints of abdominal pain and distention which is determine to be due to another small bowel volvulus as well, he went for expiratory laparotomy, with reduction of midgut volvulus with entero-pexy 08/21/2018 by Dr. Excell [name].  We are consulted for medical management of chronic systolic CHF, CKD3 & CAD.   Subjective: Tolerating soft food. No dyspnea, cough, chest pain, nausea or vomiting.    Assessment & Plan:   Principal Problem:   Midgut volvulus - recurrent - s/p ex lap/LOA/enteropexy 08/21/2018 - Management per general surgery- o   Active Problems:     CKD (chronic kidney disease), stage III (HCC) - baseline Cr is about 1.2 with GFR in the 86s - Cr was 1.67 on admission and has improved to 0.96 now  Anemia - likely due to Acute blood loss and IVF hydration-  - anemia panel suggestive of AOCD  Hyperkalemia/ Hypernatremia - resolved    CAD (coronary artery disease)   Chronic systolic CHF (congestive heart failure)  - EF 30-35%, mod pulm HTN, mild to mod Ao insufficiency and mod MR - on IV metoprolol Q 6hrs in place of Coreg-  transitioned back to coreg 10/7 - Cozaar and diuretics on hold -   resumed Cozaar 10/7 - as he is drinking adequately,   d/c'd IVF 10/7 to prevent fluid overload - Lasix not yet resumed  Mild hypoglycemia - likely due to NPO status- the patient is awake, alert and asymptomatic despite low sugars - now is eating- sugars improved    Carotid stenosis   COPD - stable - does not use inhalers as  outpt- quit smoking many years ago   DVT prophylaxis: Lovenox Code Status: Full code Family Communication:  Disposition Plan: per primary team  Antimicrobials:  Anti-infectives (From admission, onward)   Start     Dose/Rate Route Frequency Ordered Stop   08/21/18 0900  cefoTEtan (CEFOTAN) 2 g in sodium chloride 0.9 % 100 mL IVPB     2 g 200 mL/hr over 30 Minutes Intravenous On call to O.R. 08/21/18 0848 08/21/18 1202   08/21/18 0630  piperacillin-tazobactam (ZOSYN) IVPB 3.375 g     3.375 g 100 mL/hr over 30 Minutes Intravenous  Once 08/21/18 0626 08/21/18 0724       Objective: Vitals:   08/26/18 2151 08/27/18 0636 08/27/18 0855 08/27/18 0857  BP: 134/88 119/64  125/68  Pulse: 68 63 62 64  Resp: 18 18    Temp: 97.8 F (36.6 C) 97.6 F (36.4 C)    TempSrc: Oral Oral    SpO2: 99% 97% 99% 98%  Weight:  57.6 kg    Height:        Intake/Output Summary (Last 24 hours) at 08/27/2018 1429 Last data filed at 08/27/2018 1400 Gross per 24 hour  Intake 2082.51 ml  Output 2575 ml  Net -492.49 ml   Filed Weights   08/25/18 0410 08/26/18 0500 08/27/18 0636  Weight: 58.3 kg 57.6 kg 57.6 kg    Examination: General exam: Appears comfortable  HEENT: PERRLA, oral mucosa moist,  no sclera icterus or thrush Respiratory system: Clear to auscultation. Respiratory effort normal. Cardiovascular system: S1 & S2 heard, RRR.   Gastrointestinal system: Abdomen soft, mildly tender, wound on abdomen appears clean Central nervous system: Alert and oriented. No focal neurological deficits. Extremities: No cyanosis, clubbing or edema Skin: No rashes or ulcers Psychiatry:  Mood & affect appropriate.     Data Reviewed: I have personally reviewed following labs and imaging studies  CBC: Recent Labs  Lab 08/21/18 0237 08/22/18 0336 08/23/18 0322 08/25/18 0342 08/26/18 0333  WBC 8.2 10.4 7.7 6.9 4.5  NEUTROABS 5.9  --   --   --   --   HGB 12.2* 11.6* 10.8* 8.8* 11.4*  HCT 37.9* 37.0*  35.0* 28.4* 35.1*  MCV 90.7 91.1 92.3 93.4 90.5  PLT 185 163 169 162 858   Basic Metabolic Panel: Recent Labs  Lab 08/21/18 0938 08/22/18 0336 08/23/18 0322 08/24/18 0326 08/25/18 0346 08/26/18 0333 08/27/18 0432  NA  --  141 143 146* 139 140 142  K  --  3.9 4.2 5.1 3.9 4.4 4.8  CL  --  110 111 116* 113* 115* 116*  CO2  --  20* 21* 19* 22 19* 22  GLUCOSE  --  105* 77 62* 99 98 95  BUN  --  19 21 19 14  7* 5*  CREATININE  --  1.36* 1.21 1.13 1.06 0.96 0.96  CALCIUM  --  6.0* 6.7* 6.7* 6.5* 6.6* 6.6*  MG 1.3* 1.6* 2.5*  --   --   --   --   PHOS  --   --  1.8*  --   --   --   --    GFR: Estimated Creatinine Clearance: 45.8 mL/min (by C-G formula based on SCr of 0.96 mg/dL). Liver Function Tests: Recent Labs  Lab 08/21/18 0237  AST 25  ALT 16  ALKPHOS 102  BILITOT 0.7  PROT 7.4  ALBUMIN 4.0   Recent Labs  Lab 08/21/18 0237  LIPASE 27   No results for input(s): AMMONIA in the last 168 hours. Coagulation Profile: Recent Labs  Lab 08/21/18 0938  INR 1.04   Cardiac Enzymes: No results for input(s): CKTOTAL, CKMB, CKMBINDEX, TROPONINI in the last 168 hours. BNP (last 3 results) No results for input(s): PROBNP in the last 8760 hours. HbA1C: No results for input(s): HGBA1C in the last 72 hours. CBG: Recent Labs  Lab 08/24/18 0652 08/24/18 0802 08/24/18 0954  GLUCAP 57* 46* 89   Lipid Profile: No results for input(s): CHOL, HDL, LDLCALC, TRIG, CHOLHDL, LDLDIRECT in the last 72 hours. Thyroid Function Tests: No results for input(s): TSH, T4TOTAL, FREET4, T3FREE, THYROIDAB in the last 72 hours. Anemia Panel: Recent Labs    08/25/18 1430 08/26/18 0333  VITAMINB12  --  463  FOLATE  --  14.8  FERRITIN 865*  --   TIBC 232*  --   IRON 58  --   RETICCTPCT 1.8  --    Urine analysis:    Component Value Date/Time   COLORURINE YELLOW 08/21/2018 0908   APPEARANCEUR CLEAR 08/21/2018 0908   LABSPEC 1.015 08/21/2018 0908   PHURINE 5.0 08/21/2018 0908    GLUCOSEU NEGATIVE 08/21/2018 0908   HGBUR NEGATIVE 08/21/2018 0908   BILIRUBINUR NEGATIVE 08/21/2018 0908   KETONESUR NEGATIVE 08/21/2018 0908   PROTEINUR 30 (A) 08/21/2018 0908   UROBILINOGEN 0.2 09/06/2015 1935   NITRITE NEGATIVE 08/21/2018 0908   LEUKOCYTESUR NEGATIVE 08/21/2018 0908   Sepsis Labs: @LABRCNTIP (procalcitonin:4,lacticidven:4) )  Recent Results (from the past 240 hour(s))  MRSA PCR Screening     Status: None   Collection Time: 08/22/18  5:16 PM  Result Value Ref Range Status   MRSA by PCR NEGATIVE NEGATIVE Final    Comment:        The GeneXpert MRSA Assay (FDA approved for NASAL specimens only), is one component of a comprehensive MRSA colonization surveillance program. It is not intended to diagnose MRSA infection nor to guide or monitor treatment for MRSA infections. Performed at [facility], [place] [address] [place], [place] [address]          Radiology Studies: No results found.    Scheduled Meds: . carvedilol  6.25 mg Oral BID WC  . enoxaparin (LOVENOX) injection  40 mg Subcutaneous Q24H  . famotidine  20 mg Oral Daily  . losartan  12.5 mg Oral Daily   Continuous Infusions: . sodium chloride 250 mL (08/26/18 1350)     LOS: 6 days    Time spent in minutes: 35    Debbe Odea, MD [facility] Pager: [email or web address] Password [email or web address] 08/27/2018, 2:29 PM

## 2018-08-27 NOTE — Care Management Important Message (Signed)
Important Message  Patient Details  Name: [name] [name] MRN: [id number] Date of Birth: [birth year]   Medicare Important Message Given:  Yes    Kerin Salen 08/27/2018, 11:50 AMImportant Message  Patient Details  Name: [name] [name] MRN: [id number] Date of Birth: [birth year]   Medicare Important Message Given:  Yes    Kerin Salen 08/27/2018, 11:50 AM

## 2018-08-27 NOTE — Progress Notes (Signed)
[name], male   DOB: Apr 28, 1930, 84 y.o.   MRN: [id number]    6 Days Post-Op  Subjective: Pt with no complaints.  Not taking anything for pain.  Ambulating well.  Tolerating full liquids well.  Continues to have bowel function.    Objective: Vital signs in last 24 hours: Temp:  [97.6 F (36.4 C)-98.8 F (37.1 C)] 97.6 F (36.4 C) (10/08 0636) Pulse Rate:  [62-78] 64 (10/08 0857) Resp:  [14-18] 18 (10/08 0636) BP: (119-160)/(51-88) 125/68 (10/08 0857) SpO2:  [97 %-100 %] 98 % (10/08 0857) Weight:  [57.6 kg] 57.6 kg (10/08 0636) Last BM Date: 08/27/18  Intake/Output from previous day: 10/07 0701 - 10/08 0700 In: 3075.9 [P.O.:2040; I.V.:860; IV Piggyback:175.9] Out: 2050 [Urine:2050] Intake/Output this shift: No intake/output data recorded.  PE: Abd: soft, minimally tender, +BS, ND, midline incision is c/d/i with staples present Heart: regular Lungs: CTAB  Lab Results:  Recent Labs    08/25/18 0342 08/26/18 0333  WBC 6.9 4.5  HGB 8.8* 11.4*  HCT 28.4* 35.1*  PLT 162 243   BMET Recent Labs    08/26/18 0333 08/27/18 0432  NA 140 142  K 4.4 4.8  CL 115* 116*  CO2 19* 22  GLUCOSE 98 95  BUN 7* 5*  CREATININE 0.96 0.96  CALCIUM 6.6* 6.6*   PT/INR No results for input(s): LABPROT, INR in the last 72 hours. CMP     Component Value Date/Time   NA 142 08/27/2018 0432   K 4.8 08/27/2018 0432   CL 116 (H) 08/27/2018 0432   CO2 22 08/27/2018 0432   GLUCOSE 95 08/27/2018 0432   BUN 5 (L) 08/27/2018 0432   CREATININE 0.96 08/27/2018 0432   CREATININE 1.65 (H) 02/28/2016 0958   CALCIUM 6.6 (L) 08/27/2018 0432   PROT 7.4 08/21/2018 0237   ALBUMIN 4.0 08/21/2018 0237   AST 25 08/21/2018 0237   ALT 16 08/21/2018 0237   ALKPHOS 102 08/21/2018 0237   BILITOT 0.7 08/21/2018 0237   GFRNONAA >60 08/27/2018 0432   GFRAA >60 08/27/2018 0432   Lipase     Component Value Date/Time   LIPASE 27 08/21/2018 0237       Studies/Results: No results  found.  Anti-infectives: Anti-infectives (From admission, onward)   Start     Dose/Rate Route Frequency Ordered Stop   08/21/18 0900  cefoTEtan (CEFOTAN) 2 g in sodium chloride 0.9 % 100 mL IVPB     2 g 200 mL/hr over 30 Minutes Intravenous On call to O.R. 08/21/18 0848 08/21/18 1202   08/21/18 0630  piperacillin-tazobactam (ZOSYN) IVPB 3.375 g     3.375 g 100 mL/hr over 30 Minutes Intravenous  Once 08/21/18 0626 08/21/18 0724       Assessment/Plan Chronic myelocytic leukemia-on oral therapy Coronary artery disease with prior LAD stent Hx of systolic CHF(EF 30 to 62%) COPD/emphysema Carotid artery stenosis with prior right carotid endarterectomy Hx chronic kidney disease stage III Anemia - stable  Recurrent midgut volvulus; Exploratory laparotomy, reduction of midgut volvulus, entero-pexy, 08/21/2018, Dr. Excell Seltzer, POD 6 -adv to soft diet today -cont to mobilize -if continues to do well, should be stable for DC home tomorrow. -oral tylenol, tramadol prn pain  HUT:MLYY/TKPT diet WS:FKCLEXNTZ/GYFVC preop DVT: SCDs/Lovenox Follow up: Dr. Excell Seltzer  Plan: likely home tomorrow if continues to do well.   LOS: 6 days    Henreitta Cea , [facility] 08/27/2018, 8:58 AM Pager: [phone]

## 2018-08-27 NOTE — Discharge Instructions (Signed)
[facility]      [facility] [phone]  OPEN ABDOMINAL SURGERY: POST OP INSTRUCTIONS  Always review your discharge instruction sheet given to you by the facility where your surgery was performed.  IF YOU HAVE DISABILITY OR FAMILY LEAVE FORMS, YOU MUST BRING THEM TO THE OFFICE FOR PROCESSING.  PLEASE DO NOT GIVE THEM TO YOUR DOCTOR.  1. A prescription for pain medication may be given to you upon discharge.  Take your pain medication as prescribed, if needed.  If narcotic pain medicine is not needed, then you may take acetaminophen (Tylenol) or ibuprofen (Advil) as needed. 2. Take your usually prescribed medications unless otherwise directed. 3. If you need a refill on your pain medication, please contact your pharmacy. They will contact our office to request authorization.  Prescriptions will not be filled after 5pm or on week-ends. 4. You should follow a light diet the first few days after arrival home, such as soup and crackers, pudding, etc.unless your doctor has advised otherwise. A high-fiber, low fat diet can be resumed as tolerated.   Be sure to include lots of fluids daily. Most patients will experience some swelling and bruising on the chest and neck area.  Ice packs will help.  Swelling and bruising can take several days to resolve 5. Most patients will experience some swelling and bruising in the area of the incision. Ice pack will help. Swelling and bruising can take several days to resolve..  6. It is common to experience some constipation if taking pain medication after surgery.  Increasing fluid intake and taking a stool softener will usually help or prevent this problem from occurring.  A mild laxative (Milk of Magnesia or Miralax) should be taken according to package directions if there are no bowel movements after 48 hours. 7.  You may have steri-strips (small skin tapes) in place directly over the incision.  These strips should be left on the skin for 7-10 days.  If your  surgeon used skin glue on the incision, you may shower in 24 hours.  The glue will flake off over the next 2-3 weeks.  Any sutures or staples will be removed at the office during your follow-up visit. You may find that a light gauze bandage over your incision may keep your staples from being rubbed or pulled. You may shower and replace the bandage daily. 8. ACTIVITIES:  You may resume regular (light) daily activities beginning the next day--such as daily self-care, walking, climbing stairs--gradually increasing activities as tolerated.  You may have sexual intercourse when it is comfortable.  Refrain from any heavy lifting or straining until approved by your doctor. a. You may drive when you no longer are taking prescription pain medication, you can comfortably wear a seatbelt, and you can safely maneuver your car and apply brakes b. Return to Work: ___________________________________ 9. You should see your doctor in the office for a follow-up appointment approximately two weeks after your surgery.  Make sure that you call for this appointment within a day or two after you arrive home to insure a convenient appointment time. OTHER INSTRUCTIONS:  _____________________________________________________________ _____________________________________________________________  WHEN TO CALL YOUR DOCTOR: 1. Fever over 101.0 2. Inability to urinate 3. Nausea and/or vomiting 4. Extreme swelling or bruising 5. Continued bleeding from incision. 6. Increased pain, redness, or drainage from the incision. 7. Difficulty swallowing or breathing 8. Muscle cramping or spasms. 9. Numbness or tingling in hands or feet or around lips.  The clinic staff is available to   answer your questions during regular business hours.  Please don't hesitate to call and ask to speak to one of the nurses if you have concerns.  For further questions, please visit [email or web address]   

## 2018-08-27 NOTE — Discharge Summary (Signed)
[name] [id number] 1930-08-16 84 y.o.  Admit date: 08/21/2018 Discharge date: 08/28/2018  Admitting Diagnosis: Recurrent small bowel volvulus CML CAD CHF COPD/emphysema Hx of CKD  Discharge Diagnosis Patient Active Problem List   Diagnosis Date Noted  . Hypocalcemia   . Protein-calorie malnutrition, severe 01/14/2018  . Acidosis, metabolic, with respiratory acidosis 11/10/2017  . Encounter for weaning from ventilator ([place]) 11/10/2017  . CKD (chronic kidney disease), stage III ([place]) 11/10/2017  . Status post exploratory laparotomy 11/09/2017  . Bilateral rales   . 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 Cough 08/17/2016  . Carotid stenosis 09/17/2015  . Right carotid bruit 12/14/2014  . Unstable angina ([place]) 10/10/2013  . Ischemic cardiomyopathy   . Hyperlipidemia   . Chronic systolic CHF (congestive heart failure) ([place]) 10/03/2013  . CAD (coronary artery disease) 10/03/2013  . Acute systolic heart failure ([place]) 08/11/2013  . Chronic kidney disease (CKD), stage III (moderate) ([facility]) 08/11/2013  . Acute respiratory failure with hypoxia ([place]) 08/09/2013  . Pulmonary edema 08/09/2013  . Small bowel obstruction ([place]) 08/09/2013    Consultants hospitalist   Reason for Admission: Patient is an 84 year old male well-known to our service.  He was first admitted in September 2014, with small bowel obstruction with prior history of cholecystectomy, small bowel repair, hiatal hernia repair, and inguinal hernia repair.  We saw him again on 10/14/17 at which time he presented with a small bowel midgut volvulus.  He underwent exploratory laparotomy for small bowel obstruction and cecopexy by Dr. Excell Seltzer.  He did well with this was discharged 5 days later.  He returned on 11/10/2017, again with a small bowel volvulus.  He underwent exploratory laparotomy and reduction of small bowel volvulus by Dr. Rolm Bookbinder.   He was discharged and returned again on 01/10/2018, with recurrent midgut volvulus.  He underwent exploratory laparotomy lysis of adhesions, decompression of the midgut volvulus by Dr. Autumn Messing.  Patient reports doing well, except for some gas and abdominal distention at home over the last 6 months.  His family reports both abdominal distention, belching, and increased flatus.  Yesterday around 2 PM he started having abdominal pain.  This became progressively worse and feels exactly like his last 3 episodes of small bowel volvulus.  He presented to the ED around 1:54 AM with abdominal pain, some nausea, and one episode of emesis.  Work-up in the ED shows he is afebrile his vital signs of been stable. Labs show electrolytes are normal glucose 102, BUN 24, creatinine 1.67.  LFTs are normal.  WBC is 8.2, hemoglobin 12.2, hematocrit 37.9, platelets 185,000.   CT scan shows small bowel mesenteric swirling concerning for recurrent midgut volvulus similar in appearance to his prior exams.  There is a small volume of abdominal and mesenteric ascites.  Bilateral nonobstructing nephrolithiasis, chronic UPJ obstruction appearance of the kidney.  Additional chronic findings, including diverticulosis small hiatal hernia and significant aortic atherosclerosis.  We are asked to see.  Procedures Exploratory laparotomy with entero-pexy, Dr. Excell Seltzer on 08-21-18  Hospital Course:  The patient was admitted and went to the OR where he underwent the above procedure.  He tolerated this well.  He had an NGT placed and remained in place postoperatively until his ileus resolved.  On POD 3, his NGT was removed and he was started on clear liquids.  His diet was then able to be advanced as tolerated.  He mobilized well and was stable on  POD 7 for DC home.  He will go home with his staples and plan for removal next week in our office.  His other medical problems remained stable during his stay.  The hospitalist were consulted to  assist with these while he was here.  Physical Exam: Heart: regular Lungs: CTAB Abd: soft, minimally tender, incision c/d/i with staples present, +BS  Allergies as of 08/28/2018      Reactions   Percocet [oxycodone-acetaminophen] Itching   Metronidazole Other (See Comments)   Insomnia and nervousness      Medication List    STOP taking these medications   potassium chloride SA 20 MEQ tablet Commonly known as:  K-DUR,KLOR-CON     TAKE these medications   acetaminophen 500 MG tablet Commonly known as:  TYLENOL Take 1,000 mg by mouth every 6 (six) hours as needed for mild pain.   aspirin EC 81 MG tablet Take 1 tablet (81 mg total) by mouth daily.   BOSULIF 100 MG tablet Generic drug:  bosutinib Call Dr. [name], make an appointment and discuss when to resume this medicine. What changed:    how much to take  how to take this  when to take this   calcium carbonate 600 MG Tabs tablet Commonly known as:  OS-CAL Resume this as before next week when you are eating better. What changed:    how much to take  how to take this  when to take this   carvedilol 12.5 MG tablet Commonly known as:  COREG TAKE 1/2 TABLET TWICE DAILY WITH A MEAL What changed:  See the new instructions.   cyanocobalamin 1000 MCG tablet Take 1,000 mcg by mouth daily.   ezetimibe 10 MG tablet Commonly known as:  ZETIA Take 1 tablet (10 mg total) by mouth daily.   furosemide 20 MG tablet Commonly known as:  LASIX TAKE ONE (1) TABLET BY MOUTH EACH DAY What changed:  See the new instructions.   losartan 25 MG tablet Commonly known as:  COZAAR TAKE 1/2 TABLET BY MOUTH DAILY   methocarbamol 500 MG tablet Commonly known as:  ROBAXIN Take 1 tablet (500 mg total) by mouth every 8 (eight) hours as needed for muscle spasms.   pyridOXINE 100 MG tablet Commonly known as:  VITAMIN B-6 Take 100 mg by mouth daily.   traMADol 50 MG tablet Commonly known as:  ULTRAM Take 1 tablet (50 mg total) by  mouth every 6 (six) hours as needed for moderate pain.   VITAMIN B1 PO Take 250 mg by mouth daily.   VITAMIN C PO Take 1,000 mg by mouth daily.        Follow-up Information    [facility], [place] Follow up on 09/02/2018.   Specialty:  General Surgery Why:  2:00pm, arrive by 1:30 for paperwork and check in.  This is your NURSE only visit to have your staples removed. Contact information: [address] [place] [address] [phone]        Excell Seltzer, MD Follow up on 09/17/2018.   Specialty:  General Surgery Why:  1:30pm, arrive by 1:15pm Contact information: [address] [place] [address] [phone]        Drake Leach, MD Follow up.   Specialty:  Internal Medicine Why:  as needed Contact information: [address] [place] [address] [phone]           Signed: Saverio Danker, [facility] 08/28/2018, 9:25 AM Pager: [phone]

## 2018-08-27 NOTE — Progress Notes (Signed)
Physical Therapy Discharge Patient Details Name: [name] [name] MRN: [id number] DOB: 12-31-1929 Today's Date: 08/27/2018 Time:  -     Patient discharged from PT services secondary to goals met and no further PT needs identified.patient is ambulating without difficulties.   Please see latest therapy progress note for current level of functioning and progress toward goals.    [place] Pager [phone] Office [phone]  GP     Claretha Cooper 08/27/2018, 12:28 PM

## 2018-08-27 NOTE — Progress Notes (Signed)
Key Points: Use following P&T approved IV to PO non-antibiotic change policy.  Description contains the criteria that are approved Note: Policy Excludes:  Esophagectomy patientsPHARMACIST - PHYSICIAN COMMUNICATION DR:   Wynelle Cleveland CONCERNING: IV to Oral Route Change Policy  RECOMMENDATION: This patient is receiving pepcid by the intravenous route.  Based on criteria approved by the Pharmacy and Therapeutics Committee, the intravenous medication(s) is/are being converted to the equivalent oral dose form(s).   DESCRIPTION: These criteria include:  The patient is eating (either orally or via tube) and/or has been taking other orally administered medications for a least 24 hours  The patient has no evidence of active gastrointestinal bleeding or impaired GI absorption (gastrectomy, short bowel, patient on TNA or NPO).  If you have questions about this conversion, please contact the Pharmacy Department  []   [phone] )  [facility] []   [phone] )  [facility]  []   [phone] )  [facility] [x]   [phone] )  [place], [facility] 08/27/2018 7:30 AM

## 2018-08-28 DIAGNOSIS — Z9889 Other specified postprocedural states: Secondary | ICD-10-CM

## 2018-08-28 LAB — BASIC METABOLIC PANEL
Anion gap: 5 (ref 5–15)
BUN: 11 mg/dL (ref 8–23)
CO2: 22 mmol/L (ref 22–32)
Calcium: 6.7 mg/dL — ABNORMAL LOW (ref 8.9–10.3)
Chloride: 115 mmol/L — ABNORMAL HIGH (ref 98–111)
Creatinine, Ser: 1.1 mg/dL (ref 0.61–1.24)
GFR calc Af Amer: >60 mL/min (ref >60)
GFR calc non Af Amer: 59 mL/min — ABNORMAL LOW (ref >60)
Glucose, Bld: 90 mg/dL (ref 70–99)
Potassium: 4.4 mmol/L (ref 3.5–5.1)
Sodium: 142 mmol/L (ref 135–145)

## 2018-08-28 MED ORDER — ACETAMINOPHEN 500 MG PO TABS: 1000.0000 mg | ORAL_TABLET | Freq: Four times a day (QID) | ORAL | Status: DC | PRN

## 2018-08-28 MED ORDER — METHOCARBAMOL 500 MG PO TABS: 500.0000 mg | ORAL_TABLET | Freq: Three times a day (TID) | ORAL | 0 refills | Status: DC | PRN

## 2018-08-28 MED ORDER — TRAMADOL HCL 50 MG PO TABS: 50.0000 mg | ORAL_TABLET | Freq: Four times a day (QID) | ORAL | 0 refills | Status: DC | PRN

## 2018-08-28 NOTE — Progress Notes (Signed)
Patient discharged by Primary Team before seen. Recommend following up with PCP and repeating blood work with 1 week.

## 2018-08-28 NOTE — Progress Notes (Signed)
Chart reviewed this AM and Patient discharged by Primary Team before seen. Recommend following up with PCP and repeating blood work with 1 week.

## 2018-08-28 NOTE — Progress Notes (Signed)
Discharge and medication instructions reviewed with patient. Questions answered and patient denies further questions. Daughter is en route to drive patient home. No prescriptions given to patient. Donne Hazel, RN

## 2018-09-02 ENCOUNTER — Other Ambulatory Visit: Payer: Medicare HMO

## 2018-09-26 ENCOUNTER — Ambulatory Visit: Payer: Medicare HMO | Admitting: Nurse Practitioner

## 2018-10-04 ENCOUNTER — Ambulatory Visit: Payer: Medicare HMO | Admitting: Nurse Practitioner

## 2018-10-04 ENCOUNTER — Encounter: Payer: Self-pay | Admitting: Nurse Practitioner

## 2018-10-04 VITALS — BP 112/68 | HR 62 | Ht 69.0 in | Wt 120.2 lb

## 2018-10-04 DIAGNOSIS — R143 Flatulence: Secondary | ICD-10-CM

## 2018-10-04 NOTE — Progress Notes (Addendum)
Chief Complaint:    Excessive gas  IMPRESSION and PLAN:    84 year old male here with a 58-month history of a " tremendous" amount of flatus spite several doses of Gas-X every day.  Patient could be at risk for SIBO given hx of small bowel surgeries.  Additionally Dr. Havery Moros mentioned in his previous note that patient had small bowel diverticula on imaging and this would also put him at risk ( unless areas were included in previous intestinal resection )  -It is not unreasonable to test for SIBO.  Patient is going to check with his insurance company to see if the [employer] breath test is covered. He / daughter will let me know.  If test not covered it may be worthwhile treating him empirically for SIBO.  -In the interim I did ask patient to avoid milk and ice cream for a few days just to see if it makes any difference in his intestinal gas  HPI:     Patient is an 84 year old male known to Dr. Havery Moros with multiple medical problems not limited to CAD,  carotid artery disease,  CML on [place], chronic diarrhea, and diverticulitis.  He has an extensive surgical history including cholecystectomy, small bowel repair, hiatal hernia repair, several exploratory laparotomies for SBO /  Cecopexy / reduction of small bowel volvulus in Nov 2018,  exploratory laparotomy with reduction of small bowel volvulus in Dec 2018 and again in Feb 2019.   Patient was seen here in March 2018.  It was noted at that time that he had an abnormal CT scan suggesting thickening of the ileum. Given his age and medical problems patient wanted to avoid colonoscopy.  He was given low FODMAP diet, trial of Imodium once daily.   Interim history:  Patient admitted early October for another midgut volvulus. He underwent another exploratory laparotomy with entero-pexy on 08/21/18 by Dr. Excell Seltzer   Patient is here with his oldest daughter.  He complains of excessive flatus.  He has a lot of belching as well but  Pepcid helps that.  For the "tremendous" amount of flatus he has been taking to Gas-X with every meal.  Patient says he cannot correlate the excessive gas with any foods in particular .  However, he does drink 8 to 10 ounces of milk every day and also eats a lot of ice cream.  No abdominal pain.  He does take 1 Imodium every day as we recommended at last visit.  With the daily Imodium his bowel movements are usually no more than 3 a day and those are formed.  He is slowly recovering some of his weight since last surgery.  He would like to be tested for small bowel intestinal overgrowth   Review of systems:     No chest pain, no SOB, no fevers, no urinary sx   Past Medical History:  Diagnosis Date  . Carotid artery occlusion   . Chronic systolic CHF (congestive heart failure) ([place])    a. 07/2013 Echo: EF 35-40%  . CKD (chronic kidney disease), stage III ([place])    pt. states that he does not have CKD  . CML (chronic myelocytic leukemia) ([place])   . Coronary artery disease    a. 09/2013 Cath: LM min irregs, LAD 95p(2.75x20 Promus Premier DES & 3.0x20 Promus Premier DES overlapping), D1 50-60, LCX 30p, OM1 42m, OM2 60-70p, RCA small, nl.  . Diverticulitis   . Dysphonia 01/08/2017  . History of hiatal hernia   .  History of kidney stones   . History of small bowel obstruction   . Hyperlipidemia    denies  . Ischemic cardiomyopathy    a. 07/2013 Echo: EF 35-40%, apical septal, apical lat, apical AK, mod MR.  . Small bowel perforation ([place])    a. 11/2012 s/p emergent SB resection.  . 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 Ascorbic Acid (VITAMIN C PO) Take 1,000 mg by mouth daily.     Marland Kitchen aspirin EC 81  MG tablet Take 1 tablet (81 mg total) by mouth daily. 90 tablet 3  . BOSULIF 100 MG tablet Call Dr. [name], make an appointment and discuss when to resume this medicine. (Patient taking differently: Take 400 mg by mouth daily with breakfast. Call Dr. [name], make an appointment and discuss when to resume this medicine.)    . calcium carbonate (CALCIUM 600) 600 MG TABS tablet Resume this as before next week when you are eating better. (Patient taking differently: Take 600 mg by mouth daily with breakfast. Resume this as before next week when you are eating better.) 60 tablet   . carvedilol (COREG) 12.5 MG tablet TAKE 1/2 TABLET TWICE DAILY WITH A MEAL (Patient taking differently: Take 6.25 mg by mouth 2 (two) times daily with a meal. ) 30 tablet 6  . cyanocobalamin 1000 MCG tablet Take 1,000 mcg by mouth daily.    Marland Kitchen ezetimibe (ZETIA) 10 MG tablet Take 1 tablet (10 mg total) by mouth daily. 30 tablet 11  . furosemide (LASIX) 20 MG tablet TAKE ONE (1) TABLET BY MOUTH EACH DAY (Patient taking differently: Take 20 mg by mouth daily. ) 30 tablet 6  . losartan (COZAAR) 25 MG tablet TAKE 1/2 TABLET BY MOUTH DAILY 15 tablet 3  . pyridOXINE (VITAMIN B-6) 100 MG tablet Take 100 mg by mouth daily.    . Simethicone (GAS-X PO) Take by mouth daily as needed.    . Thiamine Mononitrate (VITAMIN B1 PO) Take 250 mg by mouth daily.     No current facility-administered medications for this visit.     Physical Exam:     BP 112/68   Pulse 62   Ht 5\' 9"  (1.753 m)   Wt 120 lb 4 oz (54.5 kg)   BMI 17.76 kg/m   GENERAL:  Pleasant thin male in NAD PSYCH: : Cooperative, normal affect EENT:  conjunctiva pink, mucous membranes moist, neck supple without masses CARDIAC:  RRR,  + murmur, no peripheral edema PULM: Normal respiratory effort, lungs CTA bilaterally, no wheezing ABDOMEN:  Nondistended, soft, nontender. No obvious masses, no hepatomegaly,  normal bowel sounds SKIN:  turgor, no lesions seen Musculoskeletal:   Normal muscle tone, normal strength NEURO: Alert and oriented x 3, no focal neurologic deficits   Tye Savoy , NP 10/04/2018, 11:44 AM

## 2018-10-04 NOTE — Patient Instructions (Signed)
If you are age 82 or older, your body mass index should be between 23-30. Your Body mass index is 17.76 kg/m. If this is out of the aforementioned range listed, please consider follow up with your Primary Care Provider.  If you are age 3 or younger, your body mass index should be between 19-25. Your Body mass index is 17.76 kg/m. If this is out of the aformentioned range listed, please consider follow up with your Primary Care Provider.   Please give [place] a call back after checking with insurance company regarding [employer] Breath Test for SIBO.  Thank you for choosing me and [facility].   Tye Savoy, NP

## 2018-10-04 NOTE — Progress Notes (Signed)
Agree with assessment and plan as outlined.  

## 2018-10-05 ENCOUNTER — Other Ambulatory Visit: Payer: Self-pay | Admitting: Internal Medicine

## 2018-10-22 ENCOUNTER — Other Ambulatory Visit (HOSPITAL_COMMUNITY): Payer: Self-pay | Admitting: Cardiology

## 2018-10-28 ENCOUNTER — Telehealth: Payer: Self-pay | Admitting: Nurse Practitioner

## 2018-10-28 NOTE — Telephone Encounter (Signed)
Pt's daughter Rise Paganini to inform that she called pt's insurance about Sibo test and was told that it will be covered if medically necessary, also generic of flagyl metronidazole is covered, she wants to go ahead with order for sibo but would like a call back.

## 2018-10-28 NOTE — Telephone Encounter (Signed)
SIBO test may be covered if deemed medically necessary. Insurance agent indicated a letter stating medical necessity of the SIBO test would be helpful. Flagyl is covered as a generic.  Holding dairy for 1 week made minimal difference. Decrease use of Gas-X "seems to have made a difference."

## 2018-10-29 NOTE — Telephone Encounter (Signed)
Patient daughter Bev calling Beth back to discuss options.

## 2018-10-29 NOTE — Telephone Encounter (Signed)
Beth, I have written a short letter about why he may benefit from SIBO testing.  It is not medically necessary but it would be nice to have a positive result before we treat him.  I have the letter on my desk, we can mail it to the patient if that is what they would prefer.  Other option is they can pick it up at the front desk if you will let him know. Thanks

## 2018-10-29 NOTE — Telephone Encounter (Signed)
Daughter will discuss the options with her father and let me know how he would prefer to proceed.  May want to try taking Flagyl for empiric treatment instead of the SIBO breath test.

## 2018-10-30 NOTE — Telephone Encounter (Signed)
Left message to call back and ask for me or leave information about what the patient wants to do.

## 2018-10-31 NOTE — Telephone Encounter (Signed)
Patient would like to take the Flagyl instead of doing the breath test.

## 2018-10-31 NOTE — Telephone Encounter (Signed)
Dr Havery Moros will you give me the orders for Flagyl in Paula's absence?  Thank you.

## 2018-11-01 ENCOUNTER — Other Ambulatory Visit: Payer: Self-pay

## 2018-11-01 MED ORDER — METRONIDAZOLE 500 MG PO TABS: 500.0000 mg | ORAL_TABLET | Freq: Three times a day (TID) | ORAL | 0 refills | Status: DC

## 2018-11-01 NOTE — Telephone Encounter (Signed)
Yes that's fine. We can give Flagyl 500mg  TID for 10 days and see if that helps. Thanks

## 2018-11-01 NOTE — Telephone Encounter (Signed)
Transmitted Rx to [facility]

## 2018-11-28 ENCOUNTER — Telehealth: Payer: Self-pay | Admitting: Gastroenterology

## 2018-11-28 ENCOUNTER — Other Ambulatory Visit: Payer: Self-pay

## 2018-11-28 MED ORDER — METRONIDAZOLE 500 MG PO TABS: 500.0000 mg | ORAL_TABLET | Freq: Two times a day (BID) | ORAL | 0 refills | Status: AC

## 2018-11-28 NOTE — Telephone Encounter (Signed)
Thanks everyone

## 2018-11-28 NOTE — Telephone Encounter (Signed)
Patient's daughter updated on the plan. Flagyl 500 mg BID and stop the probiotic. Rx transmitted to [facility] for the patient to pick up.

## 2018-11-28 NOTE — Telephone Encounter (Signed)
I think that's okay. Why doesn't he stop the probiotic for now and give another course of flagyl, 500mg  BID for 10 days, and see if that helps. Thanks

## 2018-11-28 NOTE — Telephone Encounter (Signed)
Spoke with the daughter Rise Paganini. The patient was treated empirically for SIBO with Flagyl. He also began taking Align at the same time. Gas issue improved on Flagyl. He continued with Align. His gas symptoms have begun returning over the past week and a half. He completed the Flagyl about 14 days ago. The patient is asking if the Flagyl can be repeated?

## 2018-11-28 NOTE — Telephone Encounter (Signed)
Pt daughter called in and stated that her daddy started to complain again about the excessive  gas

## 2019-01-19 ENCOUNTER — Other Ambulatory Visit (HOSPITAL_COMMUNITY): Payer: Self-pay | Admitting: Cardiology

## 2019-03-27 ENCOUNTER — Telehealth (HOSPITAL_COMMUNITY): Payer: Self-pay | Admitting: Cardiology

## 2019-03-27 NOTE — Telephone Encounter (Signed)
Daughter called with a request for OTC recommendations for arthritis pains that are safe with HF Reports father is having a arthritic flare in hand and has taken advil and four hours later taken aleve with minimal relief  Spoke with CHF pharmacist Bonnita Nasuti and ok for patient to take OTC tylenol arthritis, not safe to take advil and aleve together    pts daughter aware of above medication recommendations also advised to follow up with PCP for further arthritis recommendations

## 2019-04-07 ENCOUNTER — Telehealth: Payer: Self-pay | Admitting: Gastroenterology

## 2019-04-07 NOTE — Telephone Encounter (Signed)
Last seen 09-2018. Last treatment of Flagyl in Dec 2019

## 2019-04-07 NOTE — Telephone Encounter (Signed)
Thanks for the update Vivien Rota. It sounds like he finished a course last week, and wants it again? I think may be best to see him for a virtual visit to discuss this first. I can open an 11 or 1130 for this Wed if he is willing to do that. Thanks

## 2019-04-07 NOTE — Telephone Encounter (Signed)
Scheduled for a phone visit on 04-09-19 at 11am. Spoke to [place] pts daughter, she wanted you to be aware he is hard of hearing. She also requests that someone call her and give her the plan after the visit. She is a helper in his care.

## 2019-04-07 NOTE — Telephone Encounter (Signed)
Pt's daughter Rise Paganini called requesting rf for Flagyl sent to [facility] on [address] in [place]. Pt finished last rf last week and has been experiencing gas since then.

## 2019-04-07 NOTE — Telephone Encounter (Signed)
Thanks Vivien Rota, I appreciate it

## 2019-04-09 ENCOUNTER — Other Ambulatory Visit: Payer: Self-pay

## 2019-04-09 ENCOUNTER — Telehealth: Payer: Self-pay | Admitting: Gastroenterology

## 2019-04-09 ENCOUNTER — Encounter: Payer: Self-pay | Admitting: Gastroenterology

## 2019-04-09 ENCOUNTER — Ambulatory Visit (INDEPENDENT_AMBULATORY_CARE_PROVIDER_SITE_OTHER): Payer: Medicare HMO | Admitting: Gastroenterology

## 2019-04-09 VITALS — Ht 69.0 in | Wt 120.0 lb

## 2019-04-09 DIAGNOSIS — K6389 Other specified diseases of intestine: Secondary | ICD-10-CM | POA: Diagnosis not present

## 2019-04-09 DIAGNOSIS — R140 Abdominal distension (gaseous): Secondary | ICD-10-CM

## 2019-04-09 MED ORDER — RIFAXIMIN 550 MG PO TABS: 550.0000 mg | ORAL_TABLET | Freq: Three times a day (TID) | ORAL | 0 refills | Status: DC

## 2019-04-09 NOTE — Progress Notes (Signed)
THIS ENCOUNTER IS A VIRTUAL VISIT DUE TO COVID-19 - PATIENT WAS NOT SEEN IN THE OFFICE. PATIENT HAS CONSENTED TO VIRTUAL VISIT / TELEMEDICINE VISIT VIA PHONE TODAY   Location of patient: home Location of provider: office Persons participating: myself, patient  HPI :  85 y/o male with history of CML, CHF, CAD s/p stent placement, recurrent midgut volvulus, small bowel diverticulosis, here for a follow up visit.   I have seen him in the past for diarrhea, thought to be due to medication to treat CML. Previously on Gleevac but now Bosutinib. Since I have seen him a few years ago he was admitted 4 separate times - 09/2017, 11/10/17, 01/10/18, 10.2/19 for recurrent midgut volvulus. He had surgery for all of these episodes, last per Dr. Excell Seltzer in October 2019,  ex-lap and entero-pexy. He also has a history of small bowel diverticulosis and colonic diverticulitis.   He has a history of severe abdominal distension / gas production which has been bothering him. We had suspected he may have had SIBO but he declined testing in November, we empirically gave him Flagyl.  Flagyl appeared to help significantly, however his symptoms have recurred over time. 1-2 hours after eating he has significant bloating / distension / feels uncomfortable in his abdomen, needs to loosen his belt. He then will start passing gas excessively for several hours and then feels back to normal This happens every day. He reports Flagyl helped significantly when he took it in December and resolved his symptoms entirely, he thought the symptoms came back fairly quickly after he finished it, perhaps a few weeks. He had another course of Flagyl on January 9th. It had the same benefit again with resolution of his symptoms. He thinks for the past several weeks his symptoms have come back.   He denies any constipation at all. He has been taking bosutinib daily for CML. He will have diarrhea at baseline on this regimen, has 5-6 BMs per day on  the regimen. Uses immodium once daily to reduce his symptoms and that controls the diarrhea well. He thinks he tolerates the immodium pretty well. He has gained some weight recently. No weight loss. He has been eating well, good appetite. He does not think there are any particular foods which drive his symptoms. He tries to avoid beans and other gas forming foods. I had previously counseled on him low FODMAP diet. He has declined colonoscopy in the past and continues to do so, he had a friend who had a bowel perforation from colonoscopy . He has tried some probiotics, taking Align daily which he does not think helps.   He reports having diverticulitis in 2013, with a prior small bowel resection as well at some point in time. When I look at his chart from prior admissions there is referral to a partial left colon resection for diverticulitis, he denies having colon surgery. He was admitted in Sept 2014 for small bowel obstruction of unclear etiology. CT scan done on 10/17/16 showed small bowel diverticulosis.   No prior colonoscopy. He has had a prior flex sig he thinks 30 years ago.    Past Medical History:  Diagnosis Date  . Carotid artery occlusion   . Chronic systolic CHF (congestive heart failure) ([place])    a. 07/2013 Echo: EF 35-40%  . CKD (chronic kidney disease), stage III ([place])    pt. states that he does not have CKD  . CML (chronic myelocytic leukemia) ([facility])   . Coronary artery disease  a. 09/2013 Cath: LM min irregs, LAD 95p(2.75x20 Promus Premier DES & 3.0x20 Promus Premier DES overlapping), D1 50-60, LCX 30p, OM1 49m, OM2 60-70p, RCA small, nl.  . Diverticulitis   . Dysphonia 01/08/2017  . History of hiatal hernia   . History of kidney stones   . History of small bowel obstruction   . Hyperlipidemia    denies  . Ischemic cardiomyopathy    a. 07/2013 Echo: EF 35-40%, apical septal, apical lat, apical AK, mod MR.  . Small bowel perforation ([place])    a. 11/2012 s/p emergent SB  resection.  . 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     Past Surgical History:  Procedure Laterality Date  . BOWEL RESECTION     3cm sm intestine  . CARDIAC CATHETERIZATION  10/09/2013  . CAROTID ENDARTERECTOMY    . CHOLECYSTECTOMY    . COLON SURGERY  11/2012   BOWEL SURGERY   . CORONARY ANGIOPLASTY  10/09/2013   MID LAD  . ENDARTERECTOMY Right 09/17/2015   Procedure: Right Carotid ENDARTERECTOMY with Xenosure Patch Angioplasty;  Surgeon: Serafina Mitchell, MD;  Location: [facility];  Service: Vascular;  Laterality: Right;  . EYE SURGERY Bilateral   . INGUINAL HERNIA REPAIR Bilateral   . LAPAROSCOPIC CHOLECYSTECTOMY    . LAPAROTOMY N/A 10/14/2017   Procedure: EXPLORATORY LAPAROTOMY for SMALL BOWEL OBSTRUCTION, cecopexy;  Surgeon: Excell Seltzer, MD;  Location: [facility];  Service: General;  Laterality: N/A;  . LAPAROTOMY N/A 11/09/2017   Procedure: EXPLORATORY LAPAROTOMY reduction small bowel valvulous;  Surgeon: Rolm Bookbinder, MD;  Location: [facility];  Service: General;  Laterality: N/A;  . LAPAROTOMY N/A 01/10/2018   Procedure: exploratory laparotomy, lysis of adhesions, decompression of midgut volvulus;  Surgeon: Jovita Kussmaul, MD;  Location: [facility];  Service: General;  Laterality: N/A;  . LAPAROTOMY N/A 08/21/2018   Procedure: EXPLORATORY LAPAROTOMY;  Surgeon: Excell Seltzer, MD;  Location: [facility];  Service: General;  Laterality: N/A;  . LEFT HEART CATHETERIZATION WITH CORONARY ANGIOGRAM N/A 10/09/2013   Procedure: LEFT HEART CATHETERIZATION WITH CORONARY ANGIOGRAM;  Surgeon: Larey Dresser, MD;  Location: [facility];  Service: Cardiovascular;  Laterality: N/A;  . PERCUTANEOUS CORONARY STENT INTERVENTION (PCI-S)  10/09/2013   Procedure: PERCUTANEOUS CORONARY STENT INTERVENTION (PCI-S);  Surgeon: Larey Dresser, MD;  Location: [facility] CATH LAB;  Service: Cardiovascular;;   Family History  Problem Relation Age of Onset  . Emphysema Father        died @ 8- smoked  . Heart disease Father    . Kidney Stones Father   . Diabetes Mother        died @ 7  . Stroke Mother   . Head & neck cancer Brother   . Lymphoma Brother   . Colon cancer Neg Hx   . Stomach cancer Neg Hx   . Rectal cancer Neg Hx   . Esophageal cancer Neg Hx   . Liver cancer Neg Hx    Social History   Tobacco Use  . Smoking status: Former Smoker    Packs/day: 1.00    Years: 45.00    Pack years: 45.00    Types: Cigarettes    Last attempt to quit: 06/09/1999    Years since quitting: 19.8  . Smokeless tobacco: Never Used  Substance Use Topics  . Alcohol use: No    Alcohol/week: 0.0 standard drinks  . Drug use: No   Current Outpatient Medications  Medication Sig Dispense Refill  . aspirin EC 81 MG tablet  Take 1 tablet (81 mg total) by mouth daily. 90 tablet 3  . BOSULIF 100 MG tablet Call Dr. [name], make an appointment and discuss when to resume this medicine. (Patient taking differently: Take 400 mg by mouth daily with breakfast. Call Dr. [name], make an appointment and discuss when to resume this medicine.)    . carvedilol (COREG) 12.5 MG tablet Take 1 tablet (12.5 mg total) by mouth 2 (two) times daily. 60 tablet 5  . cyanocobalamin 1000 MCG tablet Take 1,000 mcg by mouth daily.    Marland Kitchen ezetimibe (ZETIA) 10 MG tablet Take 1 tablet (10 mg total) by mouth daily. 30 tablet 11  . furosemide (LASIX) 20 MG tablet Take 1 tablet (20 mg total) by mouth daily. 30 tablet 5  . losartan (COZAAR) 25 MG tablet TAKE ONE-HALF TABLET BY MOUTH DAILY 15 tablet 3  . pyridOXINE (VITAMIN B-6) 100 MG tablet Take 100 mg by mouth daily.    . Simethicone (GAS-X PO) Take by mouth daily as needed.     No current facility-administered medications for this visit.    Allergies  Allergen Reactions  . Percocet [Oxycodone-Acetaminophen] Itching  . Metronidazole Other (See Comments)    Insomnia and nervousness     Review of Systems: All systems reviewed and negative except where noted in HPI.     Physical Exam: Ht 5\' 9"  (1.753  m)   Wt 120 lb (54.4 kg)   BMI 17.72 kg/m  NA   ASSESSMENT AND PLAN:  85 y/o male here for reassessment of the following issues:  SIBO / abdominal distension - his symptoms are concerning for bacterial overgrowth (SIBO) in the setting of prior bowel surgeries and small bowel diverticulosis. He has chronic diarrhea from what we expect is due to his CML medication however he has declined colonoscopy in the past for this issue and for screening purposes and continues to decline given his age. He has had recurrent mid gut volvulus and most again treated surgically for this in October without recurrence since that time. He has not had benefit from ongoing use of probiotics, so he should stop them. I have tried him on a low FODMAP diet previously, although he states regardless of what he eats he will have these symptoms. He has reliably responded dramatically to Flagyl x 2, however symptoms have recurred over time. Discussed the situation with him again, given he responds dramatically well to antibiotics I do think SIBO is quite possible. I discussed options with him, if insurance will cover it will try him on a 2 week course of rifaximin 550mg  TID to see if he has a longer lasting response. If this is too expensive and not covered I asked him to contact me and can use Flagyl again, although we discussed that we want to avoid long courses of antibiotics if possible. He agreed. If no improvement or if he has recurrence again I asked him to contact me.  Clyde Cellar, MD [facility]

## 2019-04-09 NOTE — Patient Instructions (Signed)
If you are age 83 or older, your body mass index should be between 23-30. Your Body mass index is 17.72 kg/m. If this is out of the aforementioned range listed, please consider follow up with your Primary Care Provider.  If you are age 28 or younger, your body mass index should be between 19-25. Your Body mass index is 17.72 kg/m. If this is out of the aformentioned range listed, please consider follow up with your Primary Care Provider.     Stop Align   We have sent the following medications to your pharmacy for you to pick up at your convenience:  rifaximin 500mg    If rifaximin is not covered  Per Dr Havery Moros you will need to go back to flagy 500mg  twice daily for 2 weeks.   Thank you for choosing me and [facility].  Dr. Havery Moros

## 2019-04-09 NOTE — Telephone Encounter (Signed)
Dr. Havery Moros ,  We were unable to get any assistance from [facility], so RX was sent to [facility], daughter has called and states that the rifaximin is to expensive. Ok to send rx for Flagy 500mg  BID x2 wks to pharmacy?

## 2019-04-10 MED ORDER — METRONIDAZOLE 500 MG PO TABS: 500.0000 mg | ORAL_TABLET | Freq: Two times a day (BID) | ORAL | 0 refills | Status: AC

## 2019-04-10 NOTE — Telephone Encounter (Signed)
Yes that is fine, thanks much for helping with this.

## 2019-04-10 NOTE — Telephone Encounter (Signed)
Rx for Flagy 500 mg BID x2weeks sent to [place]. Pt has been informed.

## 2019-05-15 ENCOUNTER — Other Ambulatory Visit (HOSPITAL_COMMUNITY): Payer: Self-pay | Admitting: Cardiology

## 2019-07-14 ENCOUNTER — Other Ambulatory Visit (HOSPITAL_COMMUNITY): Payer: Self-pay | Admitting: Cardiology

## 2019-08-13 ENCOUNTER — Other Ambulatory Visit (HOSPITAL_COMMUNITY): Payer: Self-pay | Admitting: Cardiology

## 2019-09-12 ENCOUNTER — Other Ambulatory Visit (HOSPITAL_COMMUNITY): Payer: Self-pay | Admitting: Cardiology

## 2019-10-12 ENCOUNTER — Other Ambulatory Visit (HOSPITAL_COMMUNITY): Payer: Self-pay | Admitting: Cardiology

## 2019-11-06 ENCOUNTER — Other Ambulatory Visit: Payer: Self-pay | Admitting: Internal Medicine

## 2019-11-11 ENCOUNTER — Other Ambulatory Visit (HOSPITAL_COMMUNITY): Payer: Self-pay | Admitting: Cardiology

## 2019-12-11 ENCOUNTER — Other Ambulatory Visit (HOSPITAL_COMMUNITY): Payer: Self-pay | Admitting: Cardiology

## 2020-01-10 ENCOUNTER — Other Ambulatory Visit (HOSPITAL_COMMUNITY): Payer: Self-pay | Admitting: Cardiology

## 2020-01-16 ENCOUNTER — Other Ambulatory Visit (HOSPITAL_COMMUNITY): Payer: Self-pay | Admitting: *Deleted

## 2020-01-16 MED ORDER — LOSARTAN POTASSIUM 25 MG PO TABS: 12.5000 mg | ORAL_TABLET | Freq: Every day | ORAL | 6 refills | Status: DC

## 2020-01-16 MED ORDER — CARVEDILOL 12.5 MG PO TABS: 12.5000 mg | ORAL_TABLET | Freq: Two times a day (BID) | ORAL | 6 refills | Status: DC

## 2020-01-16 MED ORDER — EZETIMIBE 10 MG PO TABS: 10.0000 mg | ORAL_TABLET | Freq: Every day | ORAL | 6 refills | Status: DC

## 2020-01-16 MED ORDER — FUROSEMIDE 20 MG PO TABS: 20.0000 mg | ORAL_TABLET | Freq: Every day | ORAL | 6 refills | Status: DC

## 2020-02-09 ENCOUNTER — Other Ambulatory Visit (HOSPITAL_COMMUNITY): Payer: Self-pay | Admitting: Cardiology

## 2020-02-17 ENCOUNTER — Other Ambulatory Visit (HOSPITAL_COMMUNITY): Payer: Self-pay | Admitting: Cardiology

## 2020-03-10 ENCOUNTER — Encounter (HOSPITAL_COMMUNITY): Payer: Self-pay | Admitting: Cardiology

## 2020-03-10 ENCOUNTER — Telehealth (HOSPITAL_COMMUNITY): Payer: Self-pay

## 2020-03-10 ENCOUNTER — Ambulatory Visit (HOSPITAL_COMMUNITY)
Admission: RE | Admit: 2020-03-10 | Discharge: 2020-03-10 | Disposition: A | Discharge status: Home / Self Care | Payer: Medicare HMO | Source: Ambulatory Visit | Attending: Cardiology | Admitting: Cardiology

## 2020-03-10 ENCOUNTER — Other Ambulatory Visit (HOSPITAL_COMMUNITY): Payer: Self-pay | Admitting: Cardiology

## 2020-03-10 ENCOUNTER — Other Ambulatory Visit: Payer: Self-pay

## 2020-03-10 VITALS — BP 150/62 | HR 63 | Wt 126.8 lb

## 2020-03-10 DIAGNOSIS — Z7901 Long term (current) use of anticoagulants: Secondary | ICD-10-CM | POA: Diagnosis not present

## 2020-03-10 DIAGNOSIS — I2510 Atherosclerotic heart disease of native coronary artery without angina pectoris: Secondary | ICD-10-CM | POA: Insufficient documentation

## 2020-03-10 DIAGNOSIS — E785 Hyperlipidemia, unspecified: Secondary | ICD-10-CM | POA: Insufficient documentation

## 2020-03-10 DIAGNOSIS — M791 Myalgia, unspecified site: Secondary | ICD-10-CM | POA: Diagnosis not present

## 2020-03-10 DIAGNOSIS — I5022 Chronic systolic (congestive) heart failure: Secondary | ICD-10-CM | POA: Diagnosis not present

## 2020-03-10 DIAGNOSIS — G4733 Obstructive sleep apnea (adult) (pediatric): Secondary | ICD-10-CM | POA: Insufficient documentation

## 2020-03-10 DIAGNOSIS — Z87891 Personal history of nicotine dependence: Secondary | ICD-10-CM | POA: Diagnosis not present

## 2020-03-10 DIAGNOSIS — Z79899 Other long term (current) drug therapy: Secondary | ICD-10-CM | POA: Diagnosis not present

## 2020-03-10 DIAGNOSIS — I6529 Occlusion and stenosis of unspecified carotid artery: Secondary | ICD-10-CM | POA: Diagnosis not present

## 2020-03-10 DIAGNOSIS — I255 Ischemic cardiomyopathy: Secondary | ICD-10-CM | POA: Insufficient documentation

## 2020-03-10 DIAGNOSIS — Z955 Presence of coronary angioplasty implant and graft: Secondary | ICD-10-CM | POA: Insufficient documentation

## 2020-03-10 DIAGNOSIS — Z7982 Long term (current) use of aspirin: Secondary | ICD-10-CM | POA: Insufficient documentation

## 2020-03-10 DIAGNOSIS — N183 Chronic kidney disease, stage 3 unspecified: Secondary | ICD-10-CM | POA: Insufficient documentation

## 2020-03-10 LAB — LIPID PANEL
Cholesterol: 100 mg/dL (ref 0–200)
HDL: 29 mg/dL — ABNORMAL LOW (ref >40)
LDL Cholesterol: 52 mg/dL (ref 0–99)
Total CHOL/HDL Ratio: 3.4 RATIO
Triglycerides: 94 mg/dL (ref <150)
VLDL: 19 mg/dL (ref 0–40)

## 2020-03-10 LAB — CBC
HCT: 37.4 % — ABNORMAL LOW (ref 39.0–52.0)
Hemoglobin: 11.5 g/dL — ABNORMAL LOW (ref 13.0–17.0)
MCH: 29.1 pg (ref 26.0–34.0)
MCHC: 30.7 g/dL (ref 30.0–36.0)
MCV: 94.7 fL (ref 80.0–100.0)
Platelets: 219 10*3/uL (ref 150–400)
RBC: 3.95 MIL/uL — ABNORMAL LOW (ref 4.22–5.81)
RDW: 14.6 % (ref 11.5–15.5)
WBC: 9.9 10*3/uL (ref 4.0–10.5)
nRBC: 0 % (ref 0.0–0.2)

## 2020-03-10 LAB — BASIC METABOLIC PANEL
Anion gap: 11 (ref 5–15)
BUN: 20 mg/dL (ref 8–23)
CO2: 21 mmol/L — ABNORMAL LOW (ref 22–32)
Calcium: 7.5 mg/dL — ABNORMAL LOW (ref 8.9–10.3)
Chloride: 108 mmol/L (ref 98–111)
Creatinine, Ser: 1.6 mg/dL — ABNORMAL HIGH (ref 0.61–1.24)
GFR calc Af Amer: 44 mL/min — ABNORMAL LOW (ref >60)
GFR calc non Af Amer: 38 mL/min — ABNORMAL LOW (ref >60)
Glucose, Bld: 95 mg/dL (ref 70–99)
Potassium: 4.2 mmol/L (ref 3.5–5.1)
Sodium: 140 mmol/L (ref 135–145)

## 2020-03-10 MED ORDER — FUROSEMIDE 20 MG PO TABS: ORAL_TABLET | ORAL | 6 refills | Status: DC

## 2020-03-10 NOTE — Patient Instructions (Signed)
Labs done today, we will call you for abnormal results  Your physician has requested that you have a carotid duplex. This test is an ultrasound of the carotid arteries in your neck. It looks at blood flow through these arteries that supply the brain with blood. Allow one hour for this exam. There are no restrictions or special instructions. They will call you to schedule this  Please call our office in 1 year to schedule your next follow up appointment  If you have any questions or concerns before your next appointment please send [place] a message through [place] or call our office at [phone].  At the [facility], you and your health needs are our priority. As part of our continuing mission to provide you with exceptional heart care, we have created designated Provider Care Teams. These Care Teams include your primary Cardiologist (physician) and Advanced Practice Providers (APPs- Physician Assistants and Nurse Practitioners) who all work together to provide you with the care you need, when you need it.   You may see any of the following providers on your designated Care Team at your next follow up: Marland Kitchen Dr Glori Bickers . Dr Loralie Champagne . Darrick Grinder, NP . Lyda Jester, PA . Audry Riles, PharmD   Please be sure to bring in all your medications bottles to every appointment.

## 2020-03-10 NOTE — Telephone Encounter (Signed)
-----   Message from Larey Dresser, MD sent at 03/10/2020  3:36 PM EDT ----- Creatinine is high.  I think he can stop Lasix and use it as 20 mg daily x 3 days prn weight gain 3 lbs in a week or 2 lbs in 24 hrs.

## 2020-03-11 ENCOUNTER — Telehealth (HOSPITAL_COMMUNITY): Payer: Self-pay | Admitting: *Deleted

## 2020-03-11 NOTE — Telephone Encounter (Signed)
He had moderate disease in the carotid artery that was not operated on.  Would be helpful to follow, risk for stroke if worsens.  Probably would be covered by his insurance.

## 2020-03-11 NOTE — Telephone Encounter (Signed)
Labs mailed to pts home as requested.  

## 2020-03-11 NOTE — Telephone Encounter (Signed)
Pts daughter Rise Paganini called stating pt does not want to have carotids because of the cost of procedure. Pt daughter wants to know if this is medically necessary because patient is against having procedure performed.   Routed to [place]

## 2020-03-11 NOTE — Progress Notes (Signed)
Advanced Heart Failure Clinic Note   PCP: Dr. Sallyanne Havers [facility]) Cardiology: Dr. Aundra Dubin  86 y.o. male with history of recurrent small bowel obstruction and CAD with ischemic cardiomyopathy presents for followup.  In 9/14, patient was hospitalized with recurrent abdominal pain from small bowel obstruction.  This was managed conservatively with NG tube/NPO and IV fluid.  In the hospital, the patient developed pulmonary edema and actually ended up being intubated.  Troponin was normal. Echo was done showing EF 35-40% with anteroseptal, anterolateral, and apical akinesis along with moderate AI.  He had no prior known cardiac disease.  It was thought that the patient had had an out of hospital MI at some point then developed pulmonary edema with IVF while NPO.  He was diuresed and extubated.  Creatinine was up to about 1.6 in the hospital so no cardiac cath was done (not thought to be urgent given normal creatinine).   I took him for [facility] in 11/14.  This showed 95% mid LAD stenosis that was treated with 2 overlapping Promus DES.  Echo in 2/15 showed EF 30-35% with normal RV.  Coreg was cut back to 6.25 bid due to bradycardia and fatigue.   Had R CEA in 11/16 with Dr Trula Slade. He developed gynecomastia and had to stop spironolactone.  Eplerenone caused nausea/vomiting.    He was admitted in 12/18 with SBO, had ex lap with lysis of adhesions.  He was admitted again in 2/19 with SBO, again with ex lap and lysis of adhesions.    Lasix was stopped due to orthostatic symptoms, and he was admitted in 4/19 with dyspnea, found to have CHF.  He got IV Lasix and was restarted on po Lasix for home.   Echo in 4/19 with EF 30-35%, moderate LVH, mild to moderate AI, moderate MR.    He continues to follow with oncology for treatment of CML.   He returns today for HF follow up. He seems to be doing well symptomatically. He fatigues with heavy exertion but still able to do housework and yardwork.  He mows the grass.   No significant exertional dyspnea.  No chest pain. BP high in the office today, but mostly in the 0000000 range systolic when he checks at home.   ECG (personally reviewed): NSR at 58, anterolateral TWIs   Labs (9/14): K 3.4, creatinine 1.55 => 1.4, LDL 89, HDL 38 Labs (11/14): K 4.9, creatinine 1.6 Labs (1/15): K 4.1, creatinine 1.4 Labs (2/15): LDL 34, HDL 32 Labs (2/16): K 4.5, creatinine 1.5 Labs (8/16): LDL 26, HDL 33 Labs (11/16): K 3.4, creatinine 1.49 Labs (4/17): K 4, creatinine 1.65 Labs (4/18): K 4.4, creatinine 1.4 Labs (5/18): K 4.3, creatinine 1.68, LDL 21, HDL 23 Labs (4/19): K 4.2, creatinine 1.4, LDL 72  PMH: 1. Nephrolithiasis 2. H/o CCY 3. H/o diverticulitis 4. H/o SBO: initially in 1/14 with small bowel resection, again in 9/14 treated conservatively.  Ex laps for lysis of adhesions in 12/18 and 2/19.  5. Ischemic cardiomyopathy: Echo (9/14) with EF 35-40%, anteroseptal/anterolateral/apical akinesis and moderate AI.  Patient had acute pulmonary edema while hospitalized in 9/14. Echo (2/15) with EF 30-35% with wall motion abnormalities, normal RV size and systolic function, mild MR and mild AI.  - Echo (4/19): EF 30-35% with regional wall motion abnormalities, moderate LVH, mild-moderate AI, moderate MR, PASP 59 mmHg.  6. CKD stage 3 7. Aortic insufficiency: mild on last echo 8. CAD: LHC (11/14) with 95% mLAD stenosis treated with  2 overlapping Promus DES.  There was residual 50% stenosis beyond the stent margin.   9. Carotid stenosis: 2/16 carotid dopplers with 80-99% RICA stenosis, [id number] LICA stenosis. CTA neck showed 90% RICA stenosis. Right CEA 10/16.  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 COPD: Emphysema on chest CT.  12. Hyperlipidemia: Myalgias with atorvastatin and Crestor.  13. OSA: Does not want CPAP.   SH: widower (2nd time) with 4 children.  Lives in [place].  Previous heavy smoker.  Retired Chief Financial Officer.   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    Review of systems complete and found to be negative unless listed in HPI.     Current Outpatient Medications  Medication Sig Dispense Refill  . aspirin EC 81 MG tablet Take 1 tablet (81 mg total) by mouth daily. 90 tablet 3  . BOSULIF 400 MG tablet Take 400 mg by mouth daily with breakfast.    . carvedilol (COREG) 12.5 MG tablet Take 1 tablet (12.5 mg total) by mouth 2 (two) times daily. 60 tablet 6  . cyanocobalamin 1000 MCG tablet Take 1,000 mcg by mouth daily.    Marland Kitchen ezetimibe (ZETIA) 10 MG tablet TAKE ONE TABLET BY MOUTH DAILY **OFFICE VISIT NEEDED FOR FURTHER REFILLS** 30 tablet 0  . famotidine (PEPCID) 20 MG tablet Take by mouth.    . losartan (COZAAR) 25 MG tablet TAKE 1/2 TABLET BY MOUTH DAILY **OFFICE VISIT NEEDED FOR FURTHER REFILLS** 15 tablet 0  . Simethicone (GAS-X PO) Take by mouth daily as needed.    . furosemide (LASIX) 20 MG tablet Take 1 tablet by mouth every 3 days as needed for a weight gain 3Ibs in a week of or 2Ibs in a 24hrs 30 tablet 6   No current facility-administered medications for this encounter.    BP (!) 150/62   Pulse 63   Wt 57.5 kg (126 lb 12.8 oz)   SpO2 97%   BMI 18.73 kg/m  General: NAD Neck: No JVD, no thyromegaly or thyroid nodule.  Lungs: Clear to auscultation bilaterally with normal respiratory effort. CV: Nondisplaced PMI.  Heart regular S1/S2, no S3/S4, 1/6 SEM RUSB.  No peripheral edema.  No carotid bruit.  Normal pedal pulses.  Abdomen: Soft, nontender, no hepatosplenomegaly, no distention.  Skin: Intact without lesions or rashes.  Neurologic: Alert and oriented x 3.  Psych: Normal affect. Extremities: No clubbing or cyanosis.  HEENT: Normal.   Assessment/Plan: 1. Chronic systolic CHF: Ischemic cardiomyopathy, EF 30-35% on 4/19 echo. Volume status looks ok.  NYHA class I-II symptoms. Unable to take spironolactone (gynecomastia) or eplerenone (nausea/vomiting).  BP has not tolerated uptitration of losartan or Coreg (symptomatic  hypotension).   - Continue current doses of Coreg 12.5 mg BID and losartan 12.5 mg daily.  - No ICD given age.  - Continue lasix 20 mg daily   2. CAD: S/p DES to mLAD with overlapping Promus stents in 11/14.  Of note, there was residual 50% stenosis just beyond the stent margin.  No ischemic symptoms.  - He has not been taking ASA, restart ASA 81 mg daily.  - Myalgias with atorvastatin and crestor. Now taking Zetia.  3. Hyperlipidemia: Myalgias with atorvastatin and Crestor, now taking Zetia.  Check lipids today.  4. CKD: Stage 3, stable. BMET today.  5. Carotid stenosis: s/p R CEA 09/2015 with Dr Trula Slade.  - I will arrange for repeat carotid dopplers.  6. OSA: mild OSA on sleep study. Not interested in CPAP.   He can followup in  1 year if no symptomatic changes.   Loralie Champagne 03/11/2020

## 2020-03-12 NOTE — Telephone Encounter (Signed)
No answer. Left VM for daughter to call back.

## 2020-03-16 ENCOUNTER — Ambulatory Visit (HOSPITAL_COMMUNITY)
Admission: RE | Admit: 2020-03-16 | Payer: Medicare HMO | Source: Ambulatory Visit | Attending: Cardiology | Admitting: Cardiology

## 2020-03-19 ENCOUNTER — Other Ambulatory Visit (HOSPITAL_COMMUNITY): Payer: Self-pay | Admitting: Cardiology

## 2020-04-06 ENCOUNTER — Encounter: Payer: Self-pay | Admitting: Gastroenterology

## 2020-04-06 ENCOUNTER — Ambulatory Visit: Payer: Medicare HMO | Admitting: Gastroenterology

## 2020-04-06 ENCOUNTER — Telehealth (HOSPITAL_COMMUNITY): Payer: Self-pay | Admitting: *Deleted

## 2020-04-06 VITALS — BP 144/84 | HR 73 | Ht 69.0 in | Wt 132.2 lb

## 2020-04-06 DIAGNOSIS — K6389 Other specified diseases of intestine: Secondary | ICD-10-CM | POA: Diagnosis not present

## 2020-04-06 DIAGNOSIS — R140 Abdominal distension (gaseous): Secondary | ICD-10-CM

## 2020-04-06 MED ORDER — RIFAXIMIN 550 MG PO TABS: 550.0000 mg | ORAL_TABLET | Freq: Three times a day (TID) | ORAL | 0 refills | Status: AC

## 2020-04-06 MED ORDER — FUROSEMIDE 20 MG PO TABS: ORAL_TABLET | ORAL | 3 refills | Status: DC

## 2020-04-06 NOTE — Patient Instructions (Addendum)
If you are age 84 or older, your body mass index should be between 23-30. Your Body mass index is 19.52 kg/m. If this is out of the aforementioned range listed, please consider follow up with your Primary Care Provider.  If you are age 17 or younger, your body mass index should be between 19-25. Your Body mass index is 19.52 kg/m. If this is out of the aformentioned range listed, please consider follow up with your Primary Care Provider.   We have given you samples of the following medication to take: Xifaxan 550 mg: Take three times a day for 14 days  We are giving you a Low FOD-MAP diet to follow.  Please call us with recurrent symptoms or if no improvement.  Thank you for entrusting me with your care and for choosing [facility], Dr.  Cellar

## 2020-04-06 NOTE — Progress Notes (Signed)
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  I have seen him a few times in the past for this. I have seen him in the past for diarrhea, thought to be due to medication to treat CML. Previously on Gleevac but now Bosutinib. Since I have seen him a few years ago he was admitted 4 separate times - 09/2017, 11/10/17, 01/10/18, 10.2/19 for recurrent midgut volvulus. He had surgery for all of these episodes, last per Dr. Excell Seltzer in October 2019,  ex-lap and entero-pexy. He also has a history of small bowel diverticulosis and colonic diverticulitis.   He has a history of severe abdominal distension / gas production which has been bothering him.  He has had these symptoms intermittently over the past few years.  He feels like regardless of what foods he eats, at the end of the day his abdomen is significantly distended and quite uncomfortable.  This bothers him most at night when he lies down and he often does not get relief until later at night when his bowel finally decompresses a little bit.  He denies any nausea or vomiting after eating.  No postprandial abdominal pain.  Usually about 45 minutes after he eats his dinner is when he feels the worst.  He has chronic diarrhea for which he takes Imodium on a daily basis.  He has about 3 bowel movements a day on this regimen and states that been stable.  He has occasional reflux symptoms but well managed with Pepcid as needed, he states he maybe takes that once every 2 weeks.  He denies any blood in his stools.  His weights been stable.  He recently had his Lasix stopped due to increasing creatinine by Dr. Algernon Huxley of cardiology.  He states he has developed a little bit of lower extremity edema and shortness of breath since doing that he plans on speaking with his cardiologist  soon.  He has a remote history of a hiatal hernia repair several years ago.  He takes Gas-X in the morning which helps somewhat.  I have suspected he has had SIBO in light of his significant abdominal distention and risk factors for this including multiple abdominal surgeries and small bowel diverticulosis.  I have offered him testing for this to confirm the diagnosis but he has declined in the past.  We have empirically give him antibiotics in the past, specifically Flagyl.  He states this has reliably resolved his symptoms for several weeks after he takes it and is worked quite well for him.  He has had a few courses for this over the years.  I try to get him rifaximin in the past but he has not been able to afford it and we have not had samples for him.  See prior notes for details of his case.  He has declined colonoscopy in the past as a friend has had a bowel perforation from colonoscopy and is not willing to accept the risks at his age.  We have also discussed endoscopy which she is not interested in.  No prior colonoscopy. He has had a prior flex sig he thinks 30 years ago.   Echo - 02/28/18 - EF 30-35%  Past Medical History:  Diagnosis Date  . Carotid artery occlusion   . Chronic systolic CHF (congestive heart failure) ([place])    a. 07/2013 Echo:  EF 35-40%  . CKD (chronic kidney disease), stage III    pt. states that he does not have CKD  . CML (chronic myelocytic leukemia) ([place])   . Coronary artery disease    a. 09/2013 Cath: LM min irregs, LAD 95p(2.75x20 Promus Premier DES & 3.0x20 Promus Premier DES overlapping), D1 50-60, LCX 30p, OM1 21m, OM2 60-70p, RCA small, nl.  . Diverticulitis   . Dysphonia 01/08/2017  . History of hiatal hernia   . History of kidney stones   . History of small bowel obstruction   . Hyperlipidemia    denies  . Ischemic cardiomyopathy    a. 07/2013 Echo: EF 35-40%, apical septal, apical lat, apical AK, mod MR.  . Small bowel perforation ([place])    a.  11/2012 s/p emergent SB resection.  . 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     Past Surgical History:  Procedure Laterality Date  . BOWEL RESECTION     3cm sm intestine  . CARDIAC CATHETERIZATION  10/09/2013  . CAROTID ENDARTERECTOMY    . CHOLECYSTECTOMY    . COLON SURGERY  11/2012   BOWEL SURGERY   . CORONARY ANGIOPLASTY  10/09/2013   MID LAD  . ENDARTERECTOMY Right 09/17/2015   Procedure: Right Carotid ENDARTERECTOMY with Xenosure Patch Angioplasty;  Surgeon: Serafina Mitchell, MD;  Location: [facility];  Service: Vascular;  Laterality: Right;  . EYE SURGERY Bilateral   . INGUINAL HERNIA REPAIR Bilateral   . LAPAROSCOPIC CHOLECYSTECTOMY    . LAPAROTOMY N/A 10/14/2017   Procedure: EXPLORATORY LAPAROTOMY for SMALL BOWEL OBSTRUCTION, cecopexy;  Surgeon: Excell Seltzer, MD;  Location: [facility];  Service: General;  Laterality: N/A;  . LAPAROTOMY N/A 11/09/2017   Procedure: EXPLORATORY LAPAROTOMY reduction small bowel valvulous;  Surgeon: Rolm Bookbinder, MD;  Location: [facility];  Service: General;  Laterality: N/A;  . LAPAROTOMY N/A 01/10/2018   Procedure: exploratory laparotomy, lysis of adhesions, decompression of midgut volvulus;  Surgeon: Jovita Kussmaul, MD;  Location: [facility];  Service: General;  Laterality: N/A;  . LAPAROTOMY N/A 08/21/2018   Procedure: EXPLORATORY LAPAROTOMY;  Surgeon: Excell Seltzer, MD;  Location: [facility];  Service: General;  Laterality: N/A;  . LEFT HEART CATHETERIZATION WITH CORONARY ANGIOGRAM N/A 10/09/2013   Procedure: LEFT HEART CATHETERIZATION WITH CORONARY ANGIOGRAM;  Surgeon: Larey Dresser, MD;  Location: [facility] CATH LAB;  Service: Cardiovascular;  Laterality: N/A;  . PERCUTANEOUS CORONARY STENT INTERVENTION (PCI-S)  10/09/2013   Procedure: PERCUTANEOUS CORONARY STENT INTERVENTION (PCI-S);  Surgeon: Larey Dresser, MD;  Location: [facility] CATH LAB;  Service: Cardiovascular;;   Family History  Problem Relation Age of Onset  . Emphysema Father        died @ 34- smoked  .  Heart disease Father   . Kidney Stones Father   . Diabetes Mother        died @ 76  . Stroke Mother   . Head & neck cancer Brother   . Lymphoma Brother   . Colon cancer Neg Hx   . Stomach cancer Neg Hx   . Rectal cancer Neg Hx   . Esophageal cancer Neg Hx   . Liver cancer Neg Hx    Social History   Tobacco Use  . Smoking status: Former Smoker    Packs/day: 1.00    Years: 45.00    Pack years: 45.00    Types: Cigarettes    Quit date: 06/09/1999    Years since quitting: 20.8  . Smokeless tobacco: Never Used  Substance Use  Topics  . Alcohol use: No    Alcohol/week: 0.0 standard drinks  . Drug use: No   Current Outpatient Medications  Medication Sig Dispense Refill  . Ascorbic Acid (VITAMIN C) 500 MG CAPS Take by mouth daily.    Marland Kitchen aspirin EC 81 MG tablet Take 1 tablet (81 mg total) by mouth daily. 90 tablet 3  . BOSULIF 400 MG tablet Take 400 mg by mouth daily with breakfast.    . Calcium Carbonate (CALCIUM 600 PO) Take by mouth.    . carvedilol (COREG) 12.5 MG tablet Take 1 tablet (12.5 mg total) by mouth 2 (two) times daily. 60 tablet 6  . cyanocobalamin 1000 MCG tablet Take 1,000 mcg by mouth daily.    Marland Kitchen ezetimibe (ZETIA) 10 MG tablet TAKE ONE TABLET BY MOUTH DAILY **OFFICE VISIT NEEDED FOR FURTHER REFILLS** 30 tablet 0  . famotidine (PEPCID) 20 MG tablet Take 20 mg by mouth as needed.     . Ferrous Sulfate (IRON) 325 (65 Fe) MG TABS Take by mouth.    . furosemide (LASIX) 20 MG tablet TAKE ONE TABLET BY MOUTH DAILY 90 tablet 3  . loperamide (IMODIUM) 2 MG capsule Take by mouth as needed for diarrhea or loose stools.    Marland Kitchen losartan (COZAAR) 25 MG tablet Take 0.5 tablets (12.5 mg total) by mouth daily. 45 tablet 3  . Simethicone (GAS-X PO) Take by mouth daily as needed.     No current facility-administered medications for this visit.   Allergies  Allergen Reactions  . Percocet [Oxycodone-Acetaminophen] Itching  . Metronidazole Other (See Comments)    Insomnia and  nervousness     Review of Systems: All systems reviewed and negative except where noted in HPI.    No results found.  Physical Exam: BP (!) 144/84   Pulse 73   Ht 5\' 9"  (1.753 m)   Wt 132 lb 3.2 oz (60 kg)   SpO2 91%   BMI 19.52 kg/m  Constitutional: Pleasant,well-developed, male in no acute distress. Cardiovascular: Normal rate, regular rhythm.  Pulmonary/chest: Effort normal and breath sounds normal.  Abdominal: Soft, nondistended, nontender. . There are no masses palpable. No hepatomegaly. Extremities: (+) 1 LE edema bilaterally Lymphadenopathy: No cervical adenopathy noted. Neurological: Alert and oriented to person place and time. Skin: Skin is warm and dry. No rashes noted. Psychiatric: Normal mood and affect. Behavior is normal.   ASSESSMENT AND PLAN: 86 year old male here for reassessment the following:  Suspected SIBO / abdominal distension - I suspect his symptoms are most likely due to SIBO in the setting of prior bowel surgeries and small bowel diverticulosis.  The other possibility is intermittent partial small bowel obstruction.  However he has reliably responded impressively well to antibiotics as needed for this issue, making SIBO much more likely.  He has had some recurrence of symptoms since have seen him.  I discussed options with him.  Given his cardiac history and his age he is declining any endoscopic evaluation.  He is historically declined colonoscopy for screening and his diarrhea evaluation.  I have tried him on a low FODMAP diet previously which I do think has helped too much but I will have them review that again to see if there is any foods that are contributing to this.  Otherwise given his good response to antibiotics I offered him another course of this.  I will give him a trial of rifaximin 550 mg 3 times daily for 2 weeks given I have been able to  find a free sample for him today.  If this does not help him and his symptoms persist we may consider  another CT scan.  He wishes to do the least invasive work-up if at all possible and wants to proceed with antibiotics only at this time.  If he is not getting better he is agreeable to imaging if needed.  Can continue to follow-up with me as needed for this issue.  I asked him to touch base with me in few weeks to let me know how he is doing with the Rifaximin.  Bancroft Cellar, MD [facility]

## 2020-04-06 NOTE — Telephone Encounter (Signed)
pts daughter bev lukasevics left vm requesting return call. May 5th pt lasix decreased to every three days because of creatinine. Today pt was short of breath and had swelling in legs and feet. Pts daughter advised pt to take lasix today.  Daughter wants to know any recommendations from [place]. After looking at patients last labs work on 4/21 Dr.McLean advised "Creatinine is high. I think he can stop Lasix and use it as 20 mg daily x 3 days prn weight gain 3 lbs in a week or 2 lbs in 24 hrs." not take 1 tablet every three days. I called pts daughter and left detailed VM and requested a return call to make sure she understands instructions.

## 2020-04-06 NOTE — Telephone Encounter (Signed)
pts daughter returned call she is aware of directions and will call Thursday with update on patients symptoms.

## 2020-04-09 ENCOUNTER — Other Ambulatory Visit (HOSPITAL_COMMUNITY): Payer: Self-pay | Admitting: Cardiology

## 2020-04-27 ENCOUNTER — Telehealth: Payer: Self-pay | Admitting: Gastroenterology

## 2020-04-27 NOTE — Telephone Encounter (Signed)
Pt's daughter Bev called to inform that the day after pt finished treatment he began experiencing bloating again. She would like a call back with some advise on what pt can do or take.

## 2020-04-28 NOTE — Telephone Encounter (Signed)
Left message for patient to call back  

## 2020-04-28 NOTE — Telephone Encounter (Signed)
Patient's daughter reports that the patient took the xifaxan and the day after he competed the antibiotics all symptoms of bloating returned.  He felt better while on the medication.  He did not feel it worked any better than the flagyl.  He is not willing to have CT that was discussed at the last office visit.  Please advise non-invasive next treatments.

## 2020-04-28 NOTE — Telephone Encounter (Signed)
Glad to hear the Rifaximin helped but that is really disappointing it did not last longer, I had hoped it would last several weeks or months. Well, if he does not want to do any other imaging, than his option is cycling through antibiotics or consider low dose chronic suppressive antibiotics if that helps him. We could try lose dose flagyl 250mg  BID to TID for 2 weeks or so to see if that helps him at a low dose and reassess after that. thanks

## 2020-04-29 NOTE — Telephone Encounter (Signed)
Left message for patient to call back  

## 2020-04-30 MED ORDER — METRONIDAZOLE 250 MG PO TABS: 250.0000 mg | ORAL_TABLET | Freq: Two times a day (BID) | ORAL | 0 refills | Status: AC

## 2020-04-30 NOTE — Telephone Encounter (Signed)
Patient's daughter notified New rx sent

## 2020-05-21 ENCOUNTER — Other Ambulatory Visit: Payer: Self-pay | Admitting: Cardiology

## 2020-05-31 ENCOUNTER — Emergency Department (HOSPITAL_BASED_OUTPATIENT_CLINIC_OR_DEPARTMENT_OTHER): Payer: Medicare HMO

## 2020-05-31 ENCOUNTER — Other Ambulatory Visit: Payer: Self-pay

## 2020-05-31 ENCOUNTER — Encounter (HOSPITAL_BASED_OUTPATIENT_CLINIC_OR_DEPARTMENT_OTHER): Payer: Self-pay | Admitting: Emergency Medicine

## 2020-05-31 ENCOUNTER — Inpatient Hospital Stay (HOSPITAL_BASED_OUTPATIENT_CLINIC_OR_DEPARTMENT_OTHER)
Admission: EM | Admit: 2020-05-31 | Discharge: 2020-06-09 | DRG: 682 | Disposition: A | Discharge status: Home Health | Payer: Medicare HMO | Attending: Family Medicine | Admitting: Family Medicine

## 2020-05-31 DIAGNOSIS — J381 Polyp of vocal cord and larynx: Secondary | ICD-10-CM | POA: Diagnosis present

## 2020-05-31 DIAGNOSIS — E871 Hypo-osmolality and hyponatremia: Secondary | ICD-10-CM | POA: Diagnosis present

## 2020-05-31 DIAGNOSIS — I132 Hypertensive heart and chronic kidney disease with heart failure and with stage 5 chronic kidney disease, or end stage renal disease: Secondary | ICD-10-CM | POA: Diagnosis present

## 2020-05-31 DIAGNOSIS — N179 Acute kidney failure, unspecified: Secondary | ICD-10-CM | POA: Diagnosis not present

## 2020-05-31 DIAGNOSIS — C9211 Chronic myeloid leukemia, BCR/ABL-positive, in remission: Secondary | ICD-10-CM | POA: Diagnosis present

## 2020-05-31 DIAGNOSIS — K449 Diaphragmatic hernia without obstruction or gangrene: Secondary | ICD-10-CM

## 2020-05-31 DIAGNOSIS — Z20822 Contact with and (suspected) exposure to covid-19: Secondary | ICD-10-CM | POA: Diagnosis present

## 2020-05-31 DIAGNOSIS — J189 Pneumonia, unspecified organism: Secondary | ICD-10-CM

## 2020-05-31 DIAGNOSIS — N183 Chronic kidney disease, stage 3 unspecified: Secondary | ICD-10-CM | POA: Diagnosis present

## 2020-05-31 DIAGNOSIS — Z79899 Other long term (current) drug therapy: Secondary | ICD-10-CM

## 2020-05-31 DIAGNOSIS — K5712 Diverticulitis of small intestine without perforation or abscess without bleeding: Secondary | ICD-10-CM | POA: Diagnosis present

## 2020-05-31 DIAGNOSIS — D62 Acute posthemorrhagic anemia: Secondary | ICD-10-CM

## 2020-05-31 DIAGNOSIS — K529 Noninfective gastroenteritis and colitis, unspecified: Secondary | ICD-10-CM | POA: Diagnosis present

## 2020-05-31 DIAGNOSIS — R911 Solitary pulmonary nodule: Secondary | ICD-10-CM | POA: Diagnosis present

## 2020-05-31 DIAGNOSIS — K22719 Barrett's esophagus with dysplasia, unspecified: Secondary | ICD-10-CM

## 2020-05-31 DIAGNOSIS — Z833 Family history of diabetes mellitus: Secondary | ICD-10-CM

## 2020-05-31 DIAGNOSIS — K2980 Duodenitis without bleeding: Secondary | ICD-10-CM | POA: Diagnosis present

## 2020-05-31 DIAGNOSIS — Z87891 Personal history of nicotine dependence: Secondary | ICD-10-CM

## 2020-05-31 DIAGNOSIS — R0902 Hypoxemia: Secondary | ICD-10-CM

## 2020-05-31 DIAGNOSIS — I714 Abdominal aortic aneurysm, without rupture: Secondary | ICD-10-CM | POA: Diagnosis present

## 2020-05-31 DIAGNOSIS — N1832 Chronic kidney disease, stage 3b: Secondary | ICD-10-CM | POA: Diagnosis present

## 2020-05-31 DIAGNOSIS — J432 Centrilobular emphysema: Secondary | ICD-10-CM | POA: Diagnosis present

## 2020-05-31 DIAGNOSIS — R195 Other fecal abnormalities: Secondary | ICD-10-CM

## 2020-05-31 DIAGNOSIS — K5790 Diverticulosis of intestine, part unspecified, without perforation or abscess without bleeding: Secondary | ICD-10-CM | POA: Diagnosis present

## 2020-05-31 DIAGNOSIS — K2901 Acute gastritis with bleeding: Secondary | ICD-10-CM | POA: Diagnosis present

## 2020-05-31 DIAGNOSIS — Z823 Family history of stroke: Secondary | ICD-10-CM

## 2020-05-31 DIAGNOSIS — E785 Hyperlipidemia, unspecified: Secondary | ICD-10-CM | POA: Diagnosis present

## 2020-05-31 DIAGNOSIS — Z885 Allergy status to narcotic agent status: Secondary | ICD-10-CM

## 2020-05-31 DIAGNOSIS — K253 Acute gastric ulcer without hemorrhage or perforation: Secondary | ICD-10-CM

## 2020-05-31 DIAGNOSIS — I5022 Chronic systolic (congestive) heart failure: Secondary | ICD-10-CM | POA: Diagnosis present

## 2020-05-31 DIAGNOSIS — Z807 Family history of other malignant neoplasms of lymphoid, hematopoietic and related tissues: Secondary | ICD-10-CM

## 2020-05-31 DIAGNOSIS — K2270 Barrett's esophagus without dysplasia: Secondary | ICD-10-CM

## 2020-05-31 DIAGNOSIS — Z8249 Family history of ischemic heart disease and other diseases of the circulatory system: Secondary | ICD-10-CM

## 2020-05-31 DIAGNOSIS — I255 Ischemic cardiomyopathy: Secondary | ICD-10-CM | POA: Diagnosis present

## 2020-05-31 DIAGNOSIS — Z825 Family history of asthma and other chronic lower respiratory diseases: Secondary | ICD-10-CM

## 2020-05-31 DIAGNOSIS — K562 Volvulus: Secondary | ICD-10-CM | POA: Diagnosis present

## 2020-05-31 DIAGNOSIS — K269 Duodenal ulcer, unspecified as acute or chronic, without hemorrhage or perforation: Secondary | ICD-10-CM

## 2020-05-31 DIAGNOSIS — Z955 Presence of coronary angioplasty implant and graft: Secondary | ICD-10-CM

## 2020-05-31 DIAGNOSIS — R748 Abnormal levels of other serum enzymes: Secondary | ICD-10-CM | POA: Diagnosis present

## 2020-05-31 DIAGNOSIS — Z886 Allergy status to analgesic agent status: Secondary | ICD-10-CM

## 2020-05-31 DIAGNOSIS — E861 Hypovolemia: Secondary | ICD-10-CM | POA: Diagnosis present

## 2020-05-31 DIAGNOSIS — Z888 Allergy status to other drugs, medicaments and biological substances status: Secondary | ICD-10-CM

## 2020-05-31 DIAGNOSIS — Z87442 Personal history of urinary calculi: Secondary | ICD-10-CM

## 2020-05-31 DIAGNOSIS — I2510 Atherosclerotic heart disease of native coronary artery without angina pectoris: Secondary | ICD-10-CM | POA: Diagnosis present

## 2020-05-31 DIAGNOSIS — R64 Cachexia: Secondary | ICD-10-CM | POA: Diagnosis present

## 2020-05-31 DIAGNOSIS — R54 Age-related physical debility: Secondary | ICD-10-CM | POA: Diagnosis present

## 2020-05-31 DIAGNOSIS — Z808 Family history of malignant neoplasm of other organs or systems: Secondary | ICD-10-CM

## 2020-05-31 DIAGNOSIS — Z6821 Body mass index (BMI) 21.0-21.9, adult: Secondary | ICD-10-CM

## 2020-05-31 DIAGNOSIS — E872 Acidosis: Secondary | ICD-10-CM | POA: Diagnosis present

## 2020-05-31 HISTORY — DX: Other complications of anesthesia, initial encounter: T88.59XA

## 2020-05-31 LAB — SARS CORONAVIRUS 2 BY RT PCR (HOSPITAL ORDER, PERFORMED IN ~~LOC~~ HOSPITAL LAB): SARS Coronavirus 2: NEGATIVE

## 2020-05-31 LAB — C DIFFICILE QUICK SCREEN W PCR REFLEX
C Diff antigen: NEGATIVE
C Diff interpretation: NOT DETECTED
C Diff toxin: NEGATIVE

## 2020-05-31 LAB — URINALYSIS, ROUTINE W REFLEX MICROSCOPIC
Bilirubin Urine: NEGATIVE
Glucose, UA: NEGATIVE mg/dL
Ketones, ur: NEGATIVE mg/dL
Leukocytes,Ua: NEGATIVE
Nitrite: NEGATIVE
Protein, ur: 30 mg/dL — AB
Specific Gravity, Urine: >1.03 — ABNORMAL HIGH (ref 1.005–1.030)
pH: 5 (ref 5.0–8.0)

## 2020-05-31 LAB — URINALYSIS, MICROSCOPIC (REFLEX)

## 2020-05-31 LAB — CBC
HCT: 43 % (ref 39.0–52.0)
Hemoglobin: 13.3 g/dL (ref 13.0–17.0)
MCH: 28 pg (ref 26.0–34.0)
MCHC: 30.9 g/dL (ref 30.0–36.0)
MCV: 90.5 fL (ref 80.0–100.0)
Platelets: 533 10*3/uL — ABNORMAL HIGH (ref 150–400)
RBC: 4.75 MIL/uL (ref 4.22–5.81)
RDW: 16.4 % — ABNORMAL HIGH (ref 11.5–15.5)
WBC: 23.8 10*3/uL — ABNORMAL HIGH (ref 4.0–10.5)
nRBC: 0 % (ref 0.0–0.2)

## 2020-05-31 LAB — COMPREHENSIVE METABOLIC PANEL
ALT: 18 U/L (ref 0–44)
AST: 17 U/L (ref 15–41)
Albumin: 3.5 g/dL (ref 3.5–5.0)
Alkaline Phosphatase: 184 U/L — ABNORMAL HIGH (ref 38–126)
Anion gap: 12 (ref 5–15)
BUN: 36 mg/dL — ABNORMAL HIGH (ref 8–23)
CO2: 15 mmol/L — ABNORMAL LOW (ref 22–32)
Calcium: 8.2 mg/dL — ABNORMAL LOW (ref 8.9–10.3)
Chloride: 103 mmol/L (ref 98–111)
Creatinine, Ser: 3.12 mg/dL — ABNORMAL HIGH (ref 0.61–1.24)
GFR calc Af Amer: 20 mL/min — ABNORMAL LOW (ref >60)
GFR calc non Af Amer: 17 mL/min — ABNORMAL LOW (ref >60)
Glucose, Bld: 97 mg/dL (ref 70–99)
Potassium: 4.9 mmol/L (ref 3.5–5.1)
Sodium: 130 mmol/L — ABNORMAL LOW (ref 135–145)
Total Bilirubin: 0.4 mg/dL (ref 0.3–1.2)
Total Protein: 8.7 g/dL — ABNORMAL HIGH (ref 6.5–8.1)

## 2020-05-31 LAB — MAGNESIUM: Magnesium: 2.6 mg/dL — ABNORMAL HIGH (ref 1.7–2.4)

## 2020-05-31 LAB — LIPASE, BLOOD: Lipase: 18 U/L (ref 11–51)

## 2020-05-31 LAB — CREATININE, URINE, RANDOM: Creatinine, Urine: 168.99 mg/dL

## 2020-05-31 LAB — TROPONIN I (HIGH SENSITIVITY)
Troponin I (High Sensitivity): 17 ng/L (ref <18)
Troponin I (High Sensitivity): 20 ng/L — ABNORMAL HIGH (ref <18)

## 2020-05-31 LAB — LACTIC ACID, PLASMA: Lactic Acid, Venous: 1.1 mmol/L (ref 0.5–1.9)

## 2020-05-31 LAB — OCCULT BLOOD X 1 CARD TO LAB, STOOL: Fecal Occult Bld: POSITIVE — AB

## 2020-05-31 LAB — SODIUM, URINE, RANDOM: Sodium, Ur: 20 mmol/L

## 2020-05-31 IMAGING — CT CT ABD-PELV W/O CM
2 of 4 series · 11 of 36 positions shown, 13 images · non-contrast
Comparison: None.

[DATE]

CLINICAL DATA: Pneumonia, nausea, diarrhea

EXAM:
CT CHEST, ABDOMEN AND PELVIS WITHOUT CONTRAST
TECHNIQUE: Multidetector CT imaging of the chest, abdomen and pelvis was
performed following the standard protocol without IV contrast.

[Series 2: axial st · axial · 0.65mm/px · z∈[-567,-12]mm · 8 of 137 slices shown, 10 images]
[im 13/137  mediastinal]
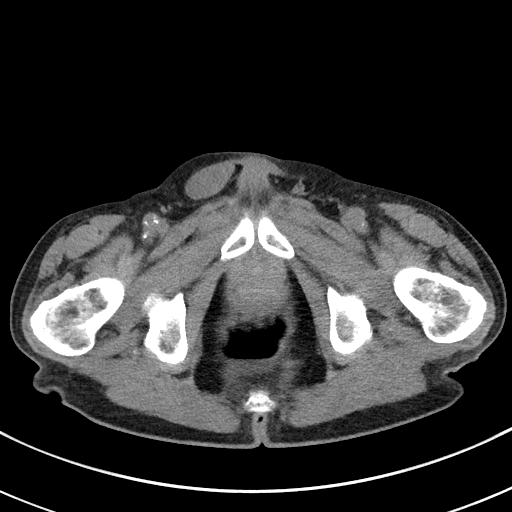
[im 13/137  lung]
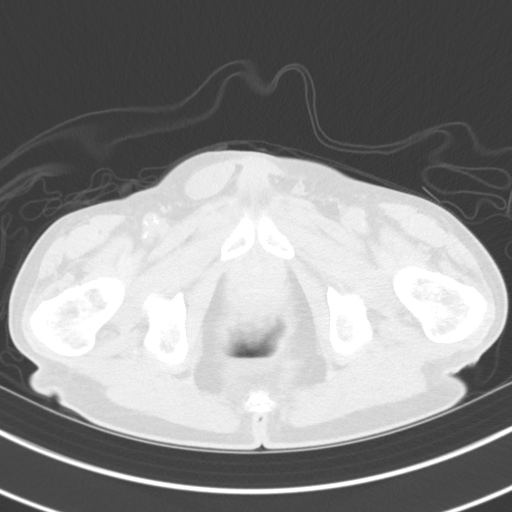
[im 25/137  lung]
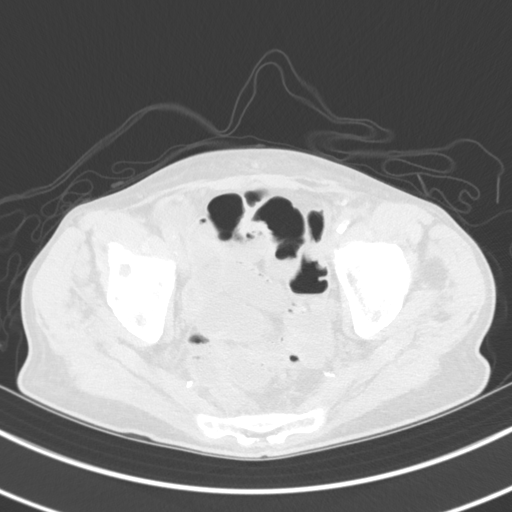
[im 50/137  lung]
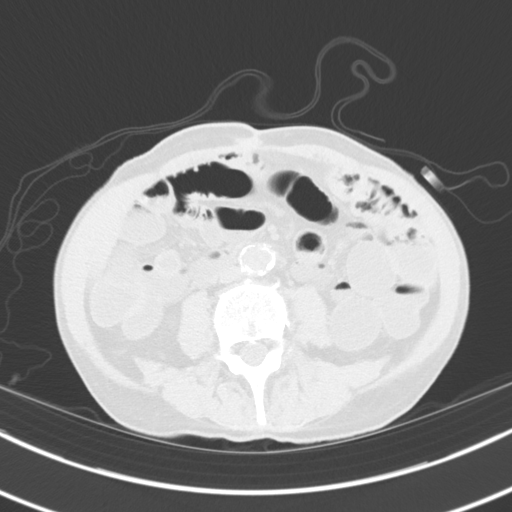
[im 62/137  lung]
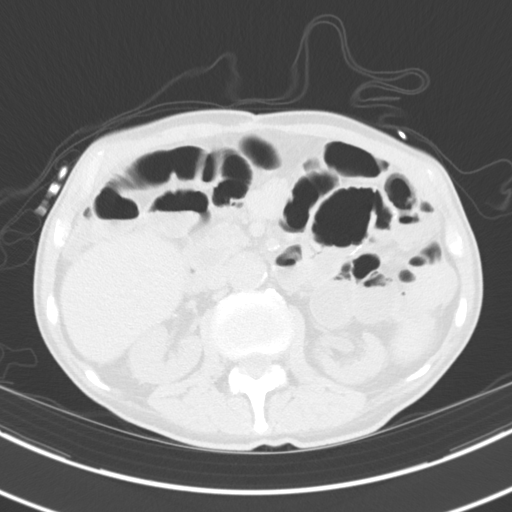
[im 75/137  mediastinal]
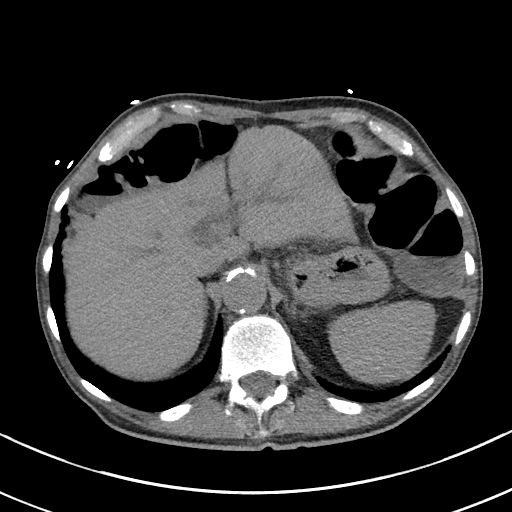
[im 75/137  lung]
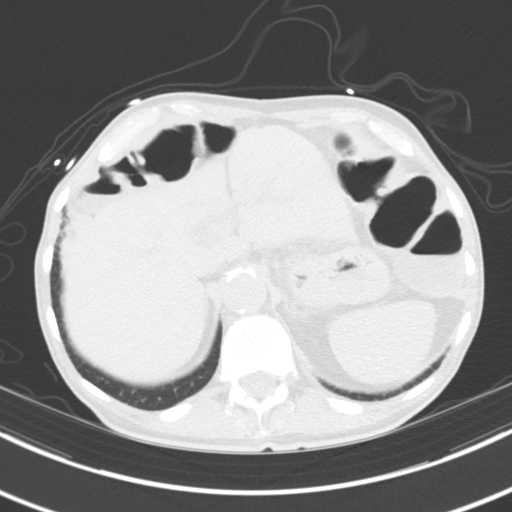
[im 87/137  lung]
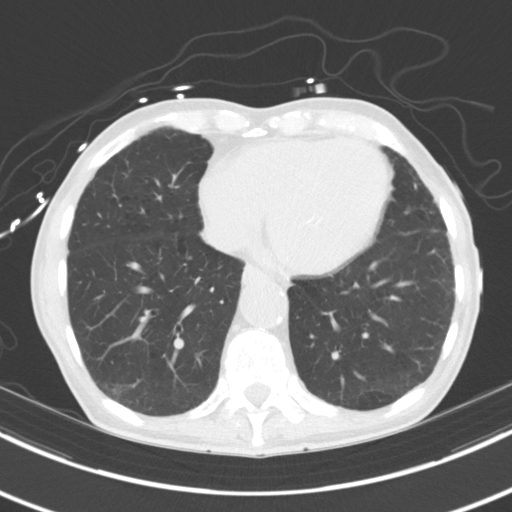
[im 112/137  lung]
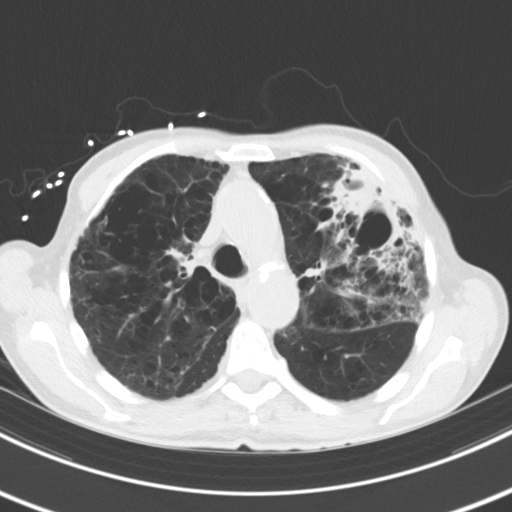
[im 124/137  lung]
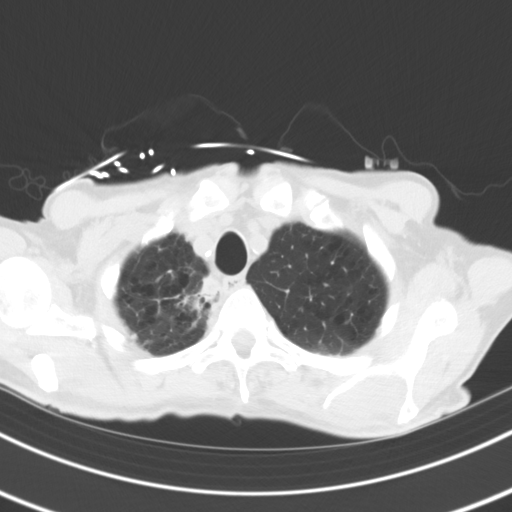

[Series 5: coronal · coronal · 0.70mm/px · 3 of 131 slices shown]
[im 27/131  lung]
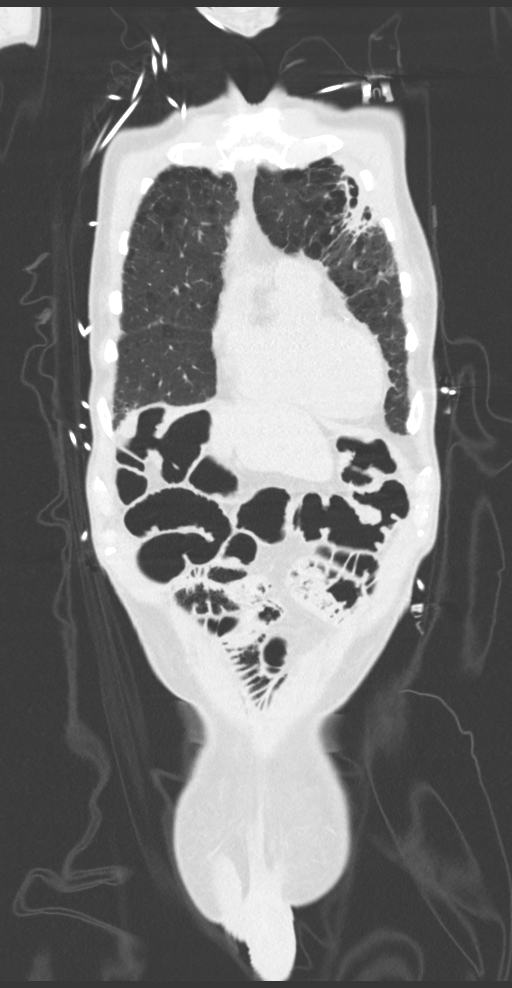
[im 53/131  lung]
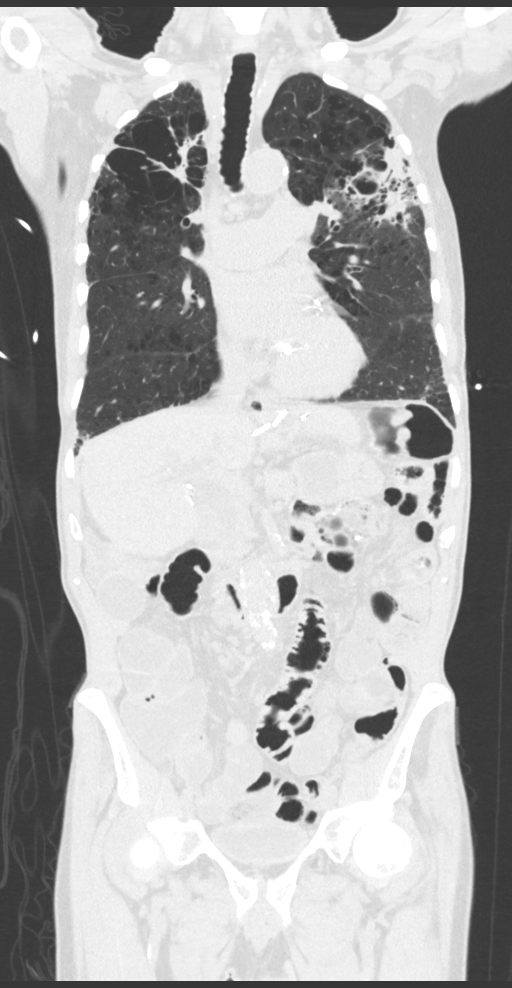
[im 79/131  lung]
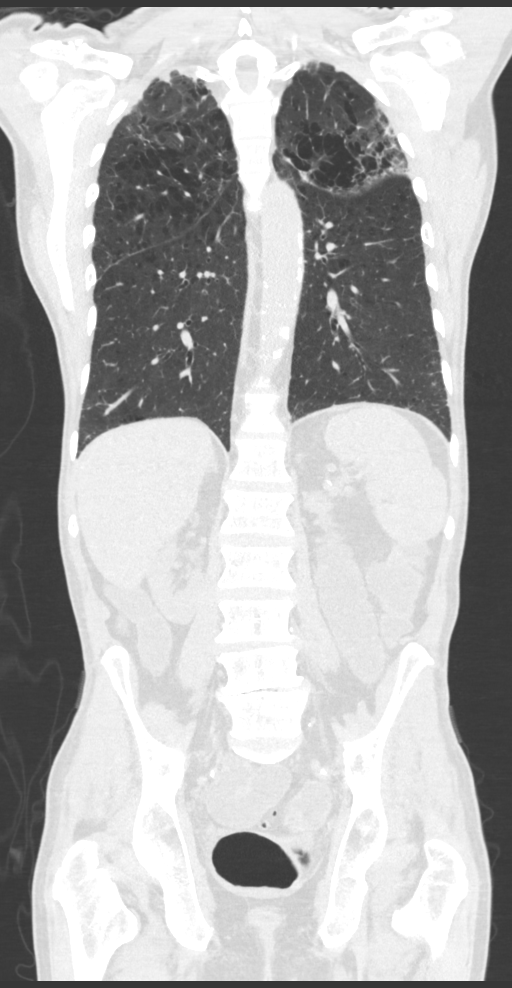

[11 of 36 positions shown; findings below may reference images not displayed]

FINDINGS: CT CHEST FINDINGS

Cardiovascular: There is extensive coronary artery calcification
identified. Coronary artery stenting involving the proximal and mid
left anterior descending coronary artery has been performed. Global
cardiac size within normal limits. Moderate calcification of the
mitral valve annulus. Mild calcification of the aortic valve
leaflets. No pericardial effusion. Central pulmonary arteries are
moderately enlarged in keeping with changes of pulmonary arterial
hypertension. Atherosclerotic calcification is seen within the
aortic arch and arch vasculature at their origins. No aneurysm.

Mediastinum/Nodes: Shotty precarinal and aortopulmonary
lymphadenopathy has progressed since prior examination, likely
reactive in nature. No frankly pathologic adenopathy is identified
within the thorax.

Lungs/Pleura: There is severe centrilobular emphysema again
identified. The lungs are symmetrically hyperinflated. Bullous
change noted at the lung apices. There is superimpose infiltrate
noted within the upper lobes bilaterally, asymmetrically more severe
within the left, most in keeping with superimposed acute infection.

An 8 mm nodule has developed within the right upper lobe, on image
57/3, new since prior examination and indeterminate. 11 mm nodular
lesion within the right upper lobe, axial image 50 is stable.3 mm
nodule within the left lower lobe, image 47/3, is also stable and
safely considered benign

Musculoskeletal: No lytic or blastic bone lesions.

CT ABDOMEN PELVIS FINDINGS

Hepatobiliary: Cholecystectomy has been performed. Marked
extrahepatic and moderate intrahepatic biliary ductal dilation may
represent post cholecystectomy change, but appears progressive since
prior examination. The liver is otherwise unremarkable.

Pancreas: Unremarkable

Spleen: Unremarkable

Adrenals/Urinary Tract: The adrenal glands are unremarkable. The
kidneys are normal in size and position. There is asymmetric mild
left renal cortical atrophy. Multiple nonobstructing calculi are
identified bilaterally measuring up to 7 mm within the lower pole of
the left kidney. There is no hydronephrosis. No ureteral calculi.
The bladder is unremarkable.

Stomach/Bowel: Surgical changes of partial small bowel resection are
identified. The small bowel demonstrates several proximal loops of
fluid-filled marginally dilated loops without a discrete point of
transition, a nonspecific finding that can be seen in the setting of
infectious or inflammatory enteritis. There is, however, no evidence
of obstruction. There is no free intraperitoneal gas or fluid. The
distal colon and rectum demonstrate air-fluid levels, a finding that
may present clinically as diarrhea. The stomach is unremarkable.

Vascular/Lymphatic: There is extensive aortoiliac atherosclerotic
calcification. 2.9 cm infrarenal abdominal aortic aneurysm is
present. There is no pathologic adenopathy within the abdomen and
pelvis.

Reproductive: Unremarkable

Other: Diffuse body wall wasting noted.

Musculoskeletal: Osseous structures are age-appropriate. No lytic or
blastic bone lesions.
IMPRESSION: Severe emphysema. Superimposed bilateral upper lobe pneumonic
infiltrate, asymmetrically more severe within the left upper lobe.
No central obstructing mass.

New 8 mm pulmonary nodule within the right upper lobe. Non-contrast
chest CT at 6-12 months is recommended. If the nodule is stable at
time of repeat CT, then future CT at 18-24 months (from today's
scan) is considered optional for low-risk patients, but is
recommended for high-risk patients. This recommendation follows the
consensus statement: Guidelines for Management of Incidental
Pulmonary Nodules Detected on CT Images: From the [HOSPITAL]

Bilateral nonobstructing nephrolithiasis.

Coronary artery and peripheral vascular disease.

2.9 cm infrarenal abdominal aortic aneurysm.

Aortic Atherosclerosis ([WH]-[WH]) and Emphysema ([WH]-[WH]).

## 2020-05-31 IMAGING — CT CT CHEST W/O CM
3 of 5 series · 13 of 36 positions shown, 14 images · non-contrast
Comparison: None.

[DATE]

CLINICAL DATA: Pneumonia, nausea, diarrhea

EXAM:
CT CHEST, ABDOMEN AND PELVIS WITHOUT CONTRAST
TECHNIQUE: Multidetector CT imaging of the chest, abdomen and pelvis was
performed following the standard protocol without IV contrast.

[Series 3: lungs · axial · 0.70mm/px · z∈[-319,+53]mm · 3 of 187 slices shown, 4 images]
[im 1/187  mediastinal]
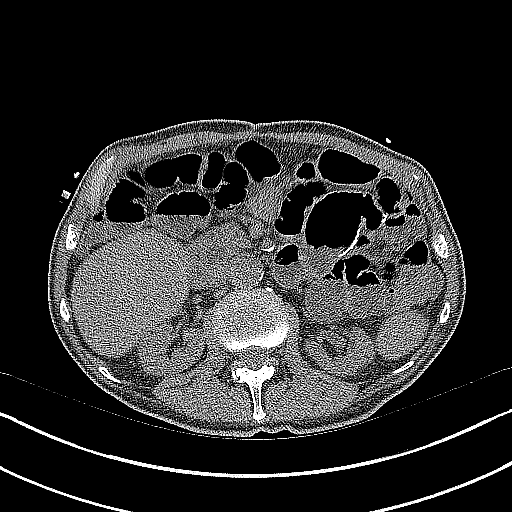
[im 1/187  lung]
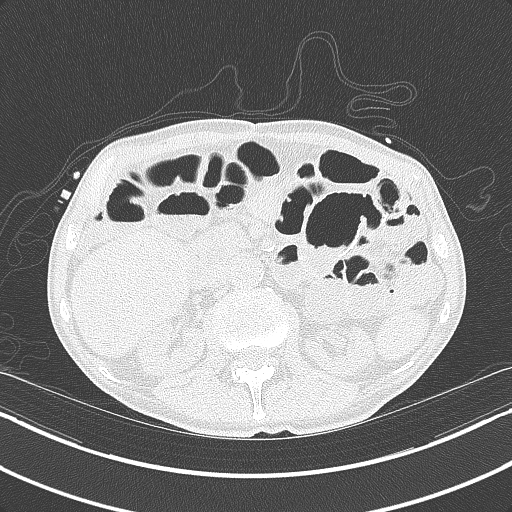
[im 94/187  lung]
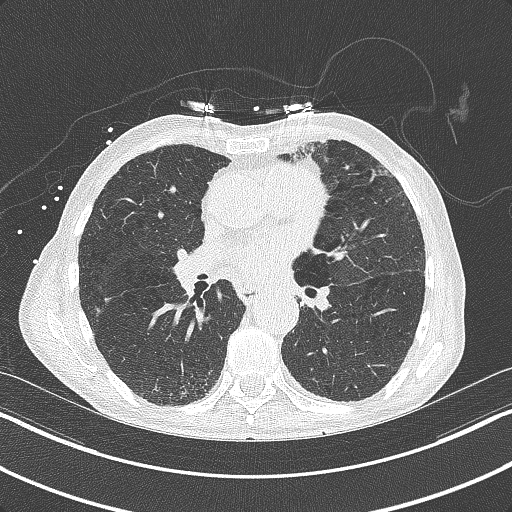
[im 187/187  lung]
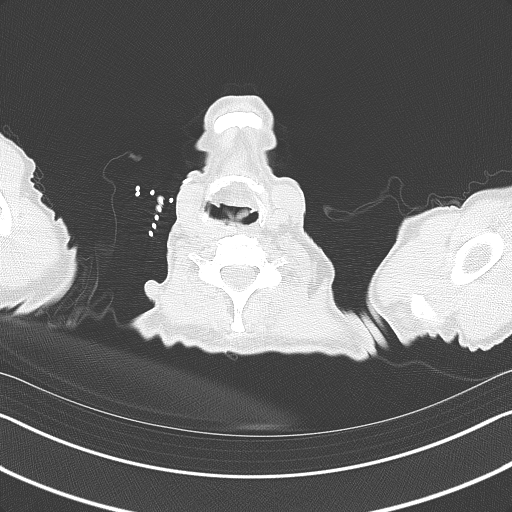

[Series 4: thins · axial · 0.69mm/px · z∈[-568,-72]mm · 7 of 1368 slices shown]
[im 125/1368  lung]
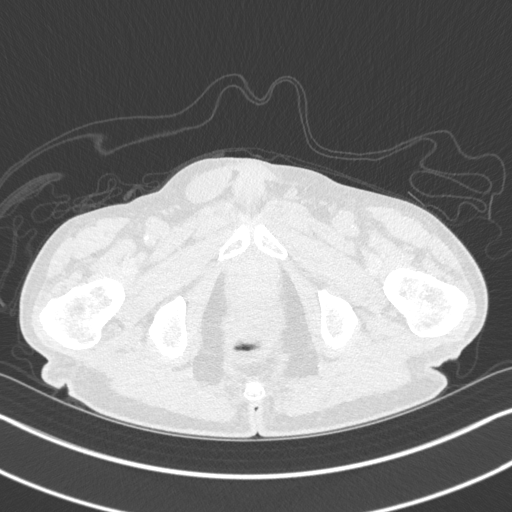
[im 249/1368  lung]
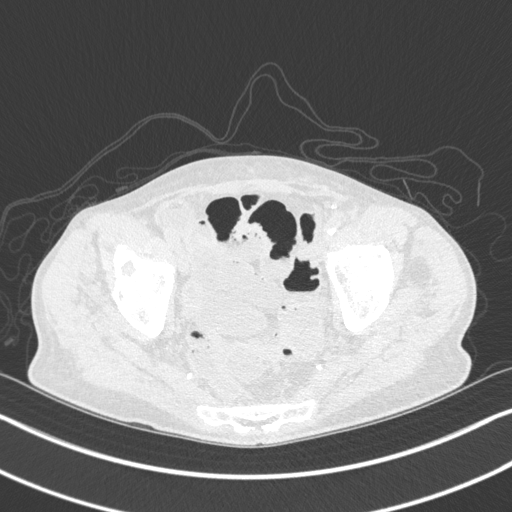
[im 435/1368  lung]
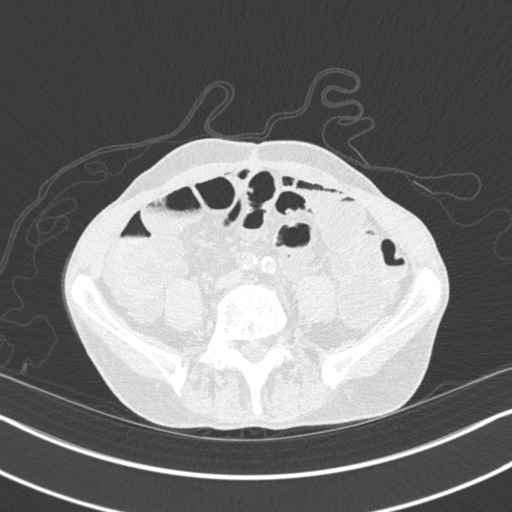
[im 622/1368  lung]
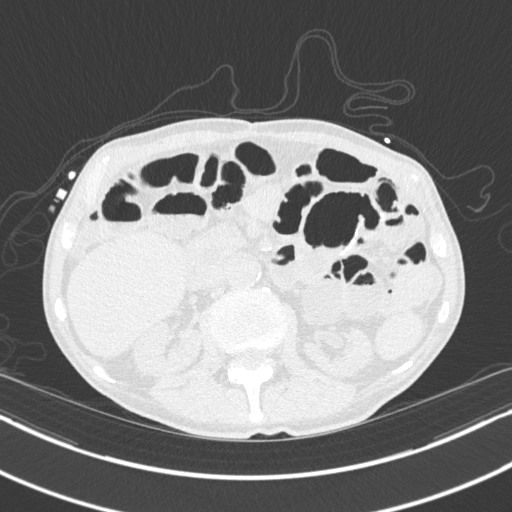
[im 746/1368  lung]
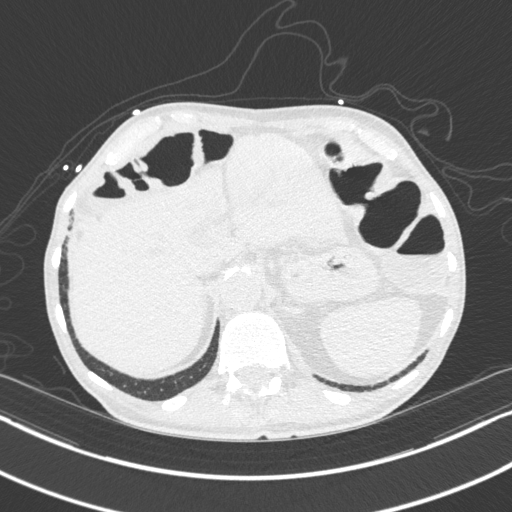
[im 933/1368  lung]
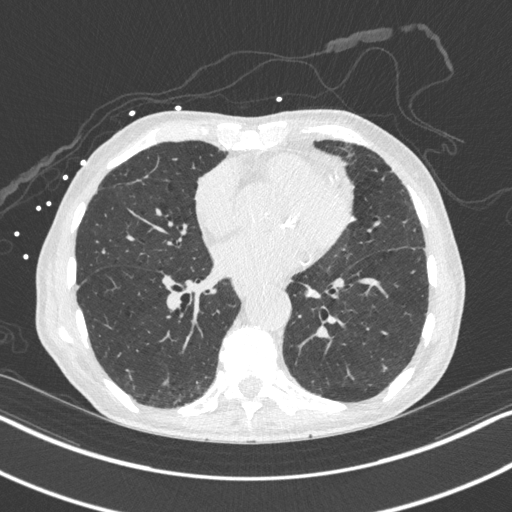
[im 1119/1368  lung]
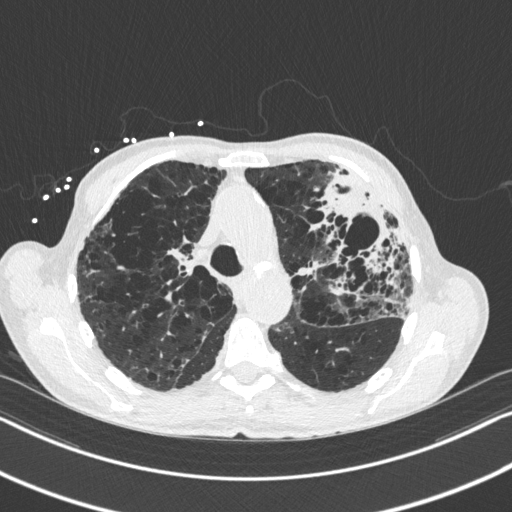

[Series 5: coronal · coronal · 0.70mm/px · 3 of 131 slices shown]
[im 27/131  lung]
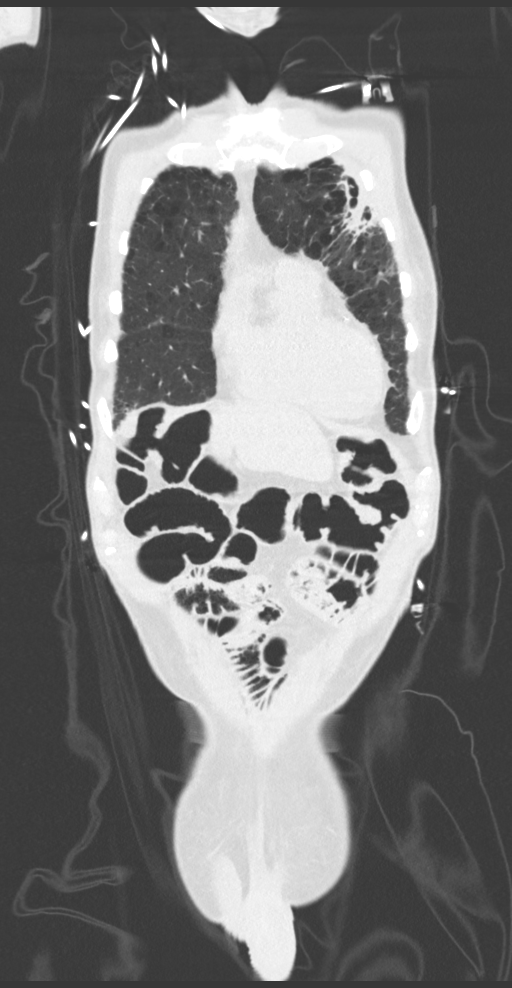
[im 53/131  lung]
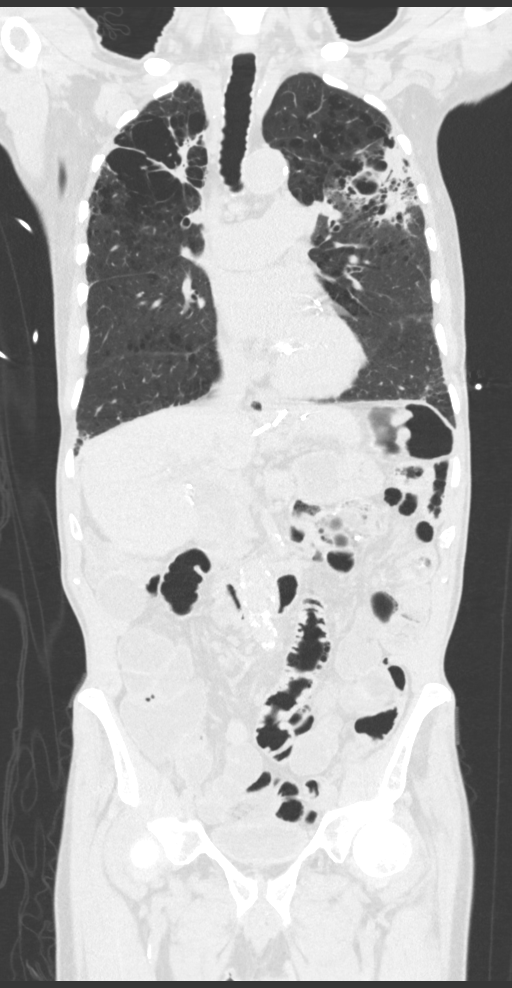
[im 79/131  lung]
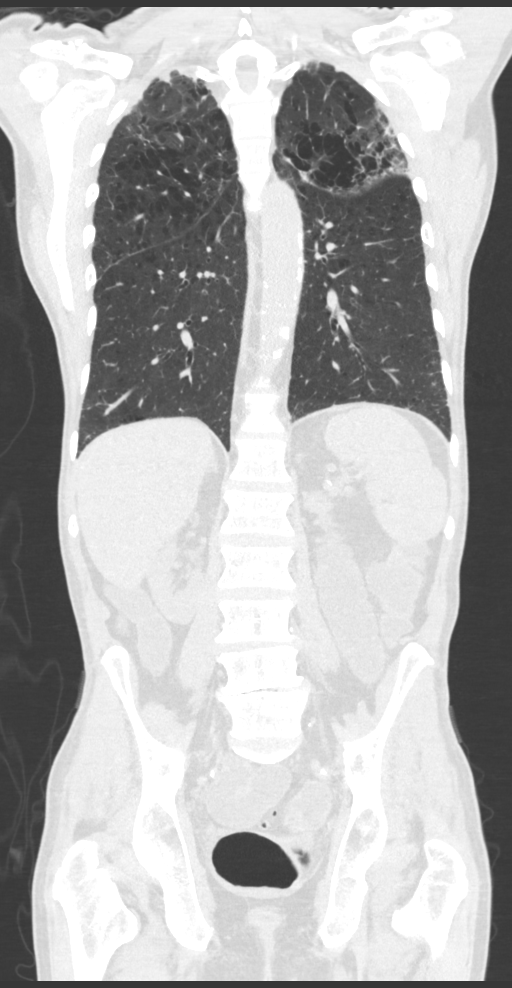

[13 of 36 positions shown; findings below may reference images not displayed]

FINDINGS: CT CHEST FINDINGS

Cardiovascular: There is extensive coronary artery calcification
identified. Coronary artery stenting involving the proximal and mid
left anterior descending coronary artery has been performed. Global
cardiac size within normal limits. Moderate calcification of the
mitral valve annulus. Mild calcification of the aortic valve
leaflets. No pericardial effusion. Central pulmonary arteries are
moderately enlarged in keeping with changes of pulmonary arterial
hypertension. Atherosclerotic calcification is seen within the
aortic arch and arch vasculature at their origins. No aneurysm.

Mediastinum/Nodes: Shotty precarinal and aortopulmonary
lymphadenopathy has progressed since prior examination, likely
reactive in nature. No frankly pathologic adenopathy is identified
within the thorax.

Lungs/Pleura: There is severe centrilobular emphysema again
identified. The lungs are symmetrically hyperinflated. Bullous
change noted at the lung apices. There is superimpose infiltrate
noted within the upper lobes bilaterally, asymmetrically more severe
within the left, most in keeping with superimposed acute infection.

An 8 mm nodule has developed within the right upper lobe, on image
57/3, new since prior examination and indeterminate. 11 mm nodular
lesion within the right upper lobe, axial image 50 is stable.3 mm
nodule within the left lower lobe, image 47/3, is also stable and
safely considered benign

Musculoskeletal: No lytic or blastic bone lesions.

CT ABDOMEN PELVIS FINDINGS

Hepatobiliary: Cholecystectomy has been performed. Marked
extrahepatic and moderate intrahepatic biliary ductal dilation may
represent post cholecystectomy change, but appears progressive since
prior examination. The liver is otherwise unremarkable.

Pancreas: Unremarkable

Spleen: Unremarkable

Adrenals/Urinary Tract: The adrenal glands are unremarkable. The
kidneys are normal in size and position. There is asymmetric mild
left renal cortical atrophy. Multiple nonobstructing calculi are
identified bilaterally measuring up to 7 mm within the lower pole of
the left kidney. There is no hydronephrosis. No ureteral calculi.
The bladder is unremarkable.

Stomach/Bowel: Surgical changes of partial small bowel resection are
identified. The small bowel demonstrates several proximal loops of
fluid-filled marginally dilated loops without a discrete point of
transition, a nonspecific finding that can be seen in the setting of
infectious or inflammatory enteritis. There is, however, no evidence
of obstruction. There is no free intraperitoneal gas or fluid. The
distal colon and rectum demonstrate air-fluid levels, a finding that
may present clinically as diarrhea. The stomach is unremarkable.

Vascular/Lymphatic: There is extensive aortoiliac atherosclerotic
calcification. 2.9 cm infrarenal abdominal aortic aneurysm is
present. There is no pathologic adenopathy within the abdomen and
pelvis.

Reproductive: Unremarkable

Other: Diffuse body wall wasting noted.

Musculoskeletal: Osseous structures are age-appropriate. No lytic or
blastic bone lesions.
IMPRESSION: Severe emphysema. Superimposed bilateral upper lobe pneumonic
infiltrate, asymmetrically more severe within the left upper lobe.
No central obstructing mass.

New 8 mm pulmonary nodule within the right upper lobe. Non-contrast
chest CT at 6-12 months is recommended. If the nodule is stable at
time of repeat CT, then future CT at 18-24 months (from today's
scan) is considered optional for low-risk patients, but is
recommended for high-risk patients. This recommendation follows the
consensus statement: Guidelines for Management of Incidental
Pulmonary Nodules Detected on CT Images: From the [HOSPITAL]

Bilateral nonobstructing nephrolithiasis.

Coronary artery and peripheral vascular disease.

2.9 cm infrarenal abdominal aortic aneurysm.

Aortic Atherosclerosis ([WH]-[WH]) and Emphysema ([WH]-[WH]).

## 2020-05-31 MED ORDER — VANCOMYCIN VARIABLE DOSE PER UNSTABLE RENAL FUNCTION (PHARMACIST DOSING)
Status: DC
  Filled 2020-05-31: qty 1

## 2020-05-31 MED ORDER — METRONIDAZOLE IN NACL 5-0.79 MG/ML-% IV SOLN
500.0000 mg | Freq: Once | INTRAVENOUS | Status: AC
  Administered 2020-05-31: 500 mg via INTRAVENOUS
  Filled 2020-05-31: qty 100

## 2020-05-31 MED ORDER — SODIUM CHLORIDE 0.9 % IV SOLN
2.0000 g | INTRAVENOUS | Status: DC
  Administered 2020-06-01 – 2020-06-02 (×2): 2 g via INTRAVENOUS
  Filled 2020-05-31 (×3): qty 2

## 2020-05-31 MED ORDER — SODIUM CHLORIDE 0.9 % IV BOLUS
1000.0000 mL | Freq: Once | INTRAVENOUS | Status: AC
  Administered 2020-05-31: 1000 mL via INTRAVENOUS

## 2020-05-31 MED ORDER — SODIUM CHLORIDE 0.9 % IV SOLN: Freq: Once | INTRAVENOUS | Status: AC

## 2020-05-31 MED ORDER — STERILE WATER FOR INJECTION IV SOLN
INTRAVENOUS | Status: AC
  Filled 2020-05-31: qty 850
  Filled 2020-05-31: qty 150

## 2020-05-31 MED ORDER — SODIUM CHLORIDE 0.9 % IV SOLN
2.0000 g | Freq: Once | INTRAVENOUS | Status: AC
  Administered 2020-05-31: 2 g via INTRAVENOUS
  Filled 2020-05-31: qty 2

## 2020-05-31 MED ORDER — VANCOMYCIN HCL IN DEXTROSE 1-5 GM/200ML-% IV SOLN
1000.0000 mg | Freq: Once | INTRAVENOUS | Status: AC
  Administered 2020-05-31: 1000 mg via INTRAVENOUS
  Filled 2020-05-31: qty 200

## 2020-05-31 NOTE — ED Notes (Signed)
[facility] notified (Tammy) - patient ready for transport 

## 2020-05-31 NOTE — ED Notes (Signed)
Pt. Order for Sterile H2O and Bicarb will be on Hold  Pharmacy called by RN Rosana Hoes and the Pharmacy will call back after speaking with the Admitting Physician.

## 2020-05-31 NOTE — Progress Notes (Signed)
Pharmacy Antibiotic Note  [name] [name] is a 86 y.o. male admitted on 05/31/2020 with sepsis.  Pharmacy has been consulted for Cefepime and Vancomycin dosing.   Height: 5\' 9"  (175.3 cm) Weight: 56.2 kg (123 lb 14.4 oz) IBW/kg (Calculated) : 70.7  Temp (24hrs), Avg:98 F (36.7 C), Min:98 F (36.7 C), Max:98 F (36.7 C)  Recent Labs  Lab 05/31/20 1402 05/31/20 1617  WBC 23.8*  --   CREATININE 3.12*  --   LATICACIDVEN  --  1.1    Estimated Creatinine Clearance: 13.3 mL/min (A) (by C-G formula based on SCr of 3.12 mg/dL (H)).    Allergies  Allergen Reactions  . Percocet [Oxycodone-Acetaminophen] Itching  . Metronidazole Other (See Comments)    Insomnia and nervousness    Antimicrobials this admission: 7/12 Cefepime >>  7/12 Vancomycin >>   Dose adjustments this admission: N/a  Microbiology results: Pending   Plan:  - Cefepime 2g IV q24h - Vancomycin 1000mg  IV x 1 dose  - Will continue to dose vancomycin based on random levels as patient has unstable renal function  - Monitor patients renal function and urine output  - De-escalate ABX when appropriate   Thank you for allowing pharmacy to be a part of this patient's care.  Duanne Limerick PharmD. BCPS 05/31/2020 6:31 PM

## 2020-05-31 NOTE — ED Triage Notes (Signed)
Recently treated for pneumonia. Currently c/o nausea and diarrhea for several days. It worsened yesterday. States he had 6-7 episodes yesterday.

## 2020-05-31 NOTE — ED Notes (Signed)
Called [facility] for [facility]

## 2020-05-31 NOTE — ED Notes (Signed)
Pt. Stool is liquid and green with no solid stool noted.

## 2020-05-31 NOTE — ED Notes (Signed)
Pt. Only has one IV Site

## 2020-05-31 NOTE — ED Provider Notes (Addendum)
[name]   CSN: [id number] Arrival date & time: 05/31/20  1046     History Chief Complaint  Patient presents with  . Diarrhea    [name] [name] is a 86 y.o. male.  HPI    Pt is an 86 year old male with history of carotid artery occlusion, CHF, CKD, CML (in remission), CAD, diverticulitis, hiatal hernia, nephrolithiasis, SBO, ischemic cardiomyopathy, hyperlipidemia, small bowel perforation, who presents to the emergency department today complaining of generalized weakness.  States that he has had symptoms for about a week.  He was seen at urgent care on 05/23/2020 and diagnosed with a left-sided pneumonia.  He was started on Augmentin at that time.  He followed up 4 days later because he was not feeling any better and azithromycin was added.  Since then he has continued to feel generally weak.  He denies any shortness of breath, cough. States he has CP but describes this more as diffuse body aches that includes the chest. He has had intermittent fevers, nausea and vomiting x1.  Yesterday he started having profuse diarrhea and has had 6 episodes daily.  States that his stool is dark but he has not noticed any blood.  He is also had some decreased p.o. intake.  He initially had abdominal pain at the onset of his diarrhea however he states that is since improved.  He denies any urinary symptoms.  Has been vaccinated against Covid.  Does note a history of SIBO and states he finished a 2-week course of rifaximin and Flagyl recently.   Past Medical History:  Diagnosis Date  . Carotid artery occlusion   . Chronic systolic CHF (congestive heart failure) ([place])    a. 07/2013 Echo: EF 35-40%  . CKD (chronic kidney disease), stage III    pt. states that he does not have CKD  . CML (chronic myelocytic leukemia) ([place])   . Coronary artery disease    a. 09/2013 Cath: LM min irregs, LAD 95p(2.75x20 Promus Premier DES & 3.0x20 Promus Premier DES overlapping),  D1 50-60, LCX 30p, OM1 39m, OM2 60-70p, RCA small, nl.  . Diverticulitis   . Dysphonia 01/08/2017  . History of hiatal hernia   . History of kidney stones   . History of small bowel obstruction   . Hyperlipidemia    denies  . Ischemic cardiomyopathy    a. 07/2013 Echo: EF 35-40%, apical septal, apical lat, apical AK, mod MR.  . Small bowel perforation ([place])    a. 11/2012 s/p emergent SB resection.  . 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    Patient Active Problem List   Diagnosis Date Noted  . Hypocalcemia   . Protein-calorie malnutrition, severe 01/14/2018  . Acidosis, metabolic, with respiratory acidosis 11/10/2017  . Encounter for weaning from ventilator ([place]) 11/10/2017  . CKD (chronic kidney disease), stage III 11/10/2017  . Status post exploratory laparotomy 11/09/2017  . Bilateral rales   . 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 Cough 08/17/2016  . Carotid stenosis 09/17/2015  . Right carotid bruit 12/14/2014  . Unstable angina ([place]) 10/10/2013  . Ischemic cardiomyopathy   . Hyperlipidemia   . Chronic systolic CHF (congestive heart failure) ([place]) 10/03/2013  . CAD (coronary artery disease) 10/03/2013  . Acute systolic heart failure ([place]) 08/11/2013  . Chronic kidney disease (CKD), stage III (moderate) ([facility]) 08/11/2013  . Acute respiratory failure with hypoxia ([place]) 08/09/2013  . Pulmonary edema 08/09/2013  . Small bowel obstruction ([place]) 08/09/2013  Past Surgical History:  Procedure Laterality Date  . BOWEL RESECTION     3cm sm intestine  . CARDIAC CATHETERIZATION  10/09/2013  . CAROTID ENDARTERECTOMY    . CHOLECYSTECTOMY    . COLON SURGERY  11/2012   BOWEL SURGERY   . CORONARY ANGIOPLASTY  10/09/2013   MID LAD  . ENDARTERECTOMY Right 09/17/2015   Procedure: Right Carotid ENDARTERECTOMY with Xenosure Patch Angioplasty;  Surgeon: Serafina Mitchell, MD;  Location: [facility];  Service: Vascular;  Laterality: Right;  . EYE SURGERY  Bilateral   . INGUINAL HERNIA REPAIR Bilateral   . LAPAROSCOPIC CHOLECYSTECTOMY    . LAPAROTOMY N/A 10/14/2017   Procedure: EXPLORATORY LAPAROTOMY for SMALL BOWEL OBSTRUCTION, cecopexy;  Surgeon: Excell Seltzer, MD;  Location: [facility];  Service: General;  Laterality: N/A;  . LAPAROTOMY N/A 11/09/2017   Procedure: EXPLORATORY LAPAROTOMY reduction small bowel valvulous;  Surgeon: Rolm Bookbinder, MD;  Location: [facility];  Service: General;  Laterality: N/A;  . LAPAROTOMY N/A 01/10/2018   Procedure: exploratory laparotomy, lysis of adhesions, decompression of midgut volvulus;  Surgeon: Jovita Kussmaul, MD;  Location: [facility];  Service: General;  Laterality: N/A;  . LAPAROTOMY N/A 08/21/2018   Procedure: EXPLORATORY LAPAROTOMY;  Surgeon: Excell Seltzer, MD;  Location: [facility];  Service: General;  Laterality: N/A;  . LEFT HEART CATHETERIZATION WITH CORONARY ANGIOGRAM N/A 10/09/2013   Procedure: LEFT HEART CATHETERIZATION WITH CORONARY ANGIOGRAM;  Surgeon: Larey Dresser, MD;  Location: [facility] CATH LAB;  Service: Cardiovascular;  Laterality: N/A;  . PERCUTANEOUS CORONARY STENT INTERVENTION (PCI-S)  10/09/2013   Procedure: PERCUTANEOUS CORONARY STENT INTERVENTION (PCI-S);  Surgeon: Larey Dresser, MD;  Location: [facility] CATH LAB;  Service: Cardiovascular;;       Family History  Problem Relation Age of Onset  . Emphysema Father        died @ 21- smoked  . Heart disease Father   . Kidney Stones Father   . Diabetes Mother        died @ 60  . Stroke Mother   . Head & neck cancer Brother   . Lymphoma Brother   . Colon cancer Neg Hx   . Stomach cancer Neg Hx   . Rectal cancer Neg Hx   . Esophageal cancer Neg Hx   . Liver cancer Neg Hx     Social History   Tobacco Use  . Smoking status: Former Smoker    Packs/day: 1.00    Years: 45.00    Pack years: 45.00    Types: Cigarettes    Quit date: 06/09/1999    Years since quitting: 20.9  . Smokeless tobacco: Never Used  Vaping Use  . Vaping  Use: Never used  Substance Use Topics  . Alcohol use: No    Alcohol/week: 0.0 standard drinks  . Drug use: No    Home Medications Prior to Admission medications   Medication Sig Start Date End Date Taking? Authorizing Provider  Ascorbic Acid (VITAMIN C) 500 MG CAPS Take by mouth daily.    [provider]  aspirin EC 81 MG tablet Take 1 tablet (81 mg total) by mouth daily. 10/02/13   Larey Dresser, MD  BOSULIF 400 MG tablet Take 400 mg by mouth daily with breakfast.    [provider]  Calcium Carbonate (CALCIUM 600 PO) Take by mouth.    [provider]  carvedilol (COREG) 12.5 MG tablet TAKE ONE TABLET BY MOUTH TWICE A DAY 05/21/20   Larey Dresser, MD  cyanocobalamin 1000 MCG tablet Take 1,000 mcg by mouth daily.    [provider]  ezetimibe (ZETIA) 10 MG tablet Take 1 tablet (10 mg total) by mouth daily. 04/09/20   Larey Dresser, MD  famotidine (PEPCID) 20 MG tablet Take 20 mg by mouth as needed.     [provider]  Ferrous Sulfate (IRON) 325 (65 Fe) MG TABS Take by mouth.    [provider]  furosemide (LASIX) 20 MG tablet Take 20 mg daily for 3 days as needed if  weight gain of 3 lbs in a week or 2 lbs in 24 hrs. 04/06/20   Larey Dresser, MD  loperamide (IMODIUM) 2 MG capsule Take by mouth as needed for diarrhea or loose stools.    [provider]  losartan (COZAAR) 25 MG tablet Take 0.5 tablets (12.5 mg total) by mouth daily. 03/15/20   Larey Dresser, MD  Simethicone (GAS-X PO) Take by mouth daily as needed.    [provider]    Allergies    Percocet [oxycodone-acetaminophen] and Metronidazole  Review of Systems   Review of Systems  Constitutional: Positive for fever.  HENT: Negative for ear pain and sore throat.   Eyes: Negative for visual disturbance.  Respiratory: Negative for cough and shortness of breath.   Cardiovascular: Positive for chest pain. Negative for palpitations.   Gastrointestinal: Positive for abdominal pain (improved), diarrhea, nausea and vomiting. Negative for blood in stool.  Genitourinary: Negative for dysuria and hematuria.  Musculoskeletal: Positive for myalgias.  Skin: Negative for rash.  Neurological: Negative for headaches.  All other systems reviewed and are negative.   Physical Exam Updated Vital Signs BP (!) 144/62   Pulse 68   Temp 98 F (36.7 C) (Oral)   Resp 18   Ht 5\' 9"  (1.753 m)   Wt 56.2 kg   SpO2 96%   BMI 18.30 kg/m   Physical Exam Vitals and nursing note reviewed.  Constitutional:      Appearance: He is well-developed.     Comments: Chronically ill, thin appearing male  HENT:     Head: Normocephalic and atraumatic.     Mouth/Throat:     Mouth: Mucous membranes are dry.  Eyes:     Conjunctiva/sclera: Conjunctivae normal.  Cardiovascular:     Rate and Rhythm: Normal rate and regular rhythm.     Heart sounds: Normal heart sounds. No murmur heard.   Pulmonary:     Effort: Pulmonary effort is normal. No respiratory distress.     Breath sounds: Normal breath sounds. No wheezing, rhonchi or rales.  Abdominal:     General: Bowel sounds are normal.     Palpations: Abdomen is soft.     Tenderness: There is no abdominal tenderness. There is no guarding or rebound.     Comments: Midline abd scar  Musculoskeletal:        General: No tenderness.     Cervical back: Neck supple.     Right lower leg: No edema.     Left lower leg: No edema.  Skin:    General: Skin is warm and dry.  Neurological:     Mental Status: He is alert.     ED Results / Procedures / Treatments   Labs (all labs ordered are listed, but only abnormal results are displayed) Labs Reviewed  COMPREHENSIVE METABOLIC PANEL - Abnormal; Notable for the following components:      Result Value   Sodium 130 (*)  CO2 15 (*)    BUN 36 (*)    Creatinine, Ser 3.12 (*)    Calcium 8.2 (*)    Total Protein 8.7 (*)    Alkaline Phosphatase 184 (*)     GFR calc non Af Amer 17 (*)    GFR calc Af Amer 20 (*)    All other components within normal limits  CBC - Abnormal; Notable for the following components:   WBC 23.8 (*)    RDW 16.4 (*)    Platelets 533 (*)    All other components within normal limits  URINALYSIS, ROUTINE W REFLEX MICROSCOPIC - Abnormal; Notable for the following components:   Specific Gravity, Urine >1.030 (*)    Hgb urine dipstick SMALL (*)    Protein, ur 30 (*)    All other components within normal limits  MAGNESIUM - Abnormal; Notable for the following components:   Magnesium 2.6 (*)    All other components within normal limits  URINALYSIS, MICROSCOPIC (REFLEX) - Abnormal; Notable for the following components:   Bacteria, UA FEW (*)    All other components within normal limits  OCCULT BLOOD X 1 CARD TO LAB, STOOL - Abnormal; Notable for the following components:   Fecal Occult Bld POSITIVE (*)    All other components within normal limits  C DIFFICILE QUICK SCREEN W PCR REFLEX  SARS CORONAVIRUS 2 BY RT PCR (HOSPITAL ORDER, PERFORMED IN [facility])  CULTURE, BLOOD (ROUTINE X 2)  CULTURE, BLOOD (ROUTINE X 2)  LIPASE, BLOOD  LACTIC ACID, PLASMA  LACTIC ACID, PLASMA  TROPONIN I (HIGH SENSITIVITY)  TROPONIN I (HIGH SENSITIVITY)    EKG EKG Interpretation  Date/Time:  Monday May 31 2020 16:14:01 EDT Ventricular Rate:  66 PR Interval:    QRS Duration: 86 QT Interval:  411 QTC Calculation: 431 R Axis:   78 Text Interpretation: Sinus rhythm Probable LVH with secondary repol abnrm nonspecific changes since previous Confirmed by Wandra Arthurs [phone]) on 05/31/2020 4:24:22 PM   Radiology CT ABDOMEN PELVIS WO CONTRAST  Result Date: 05/31/2020 CLINICAL DATA:  Pneumonia, nausea, diarrhea EXAM: CT CHEST, ABDOMEN AND PELVIS WITHOUT CONTRAST TECHNIQUE: Multidetector CT imaging of the chest, abdomen and pelvis was performed following the standard protocol without IV contrast. COMPARISON:  None.  07/06/2016 FINDINGS: CT CHEST FINDINGS Cardiovascular: There is extensive coronary artery calcification identified. Coronary artery stenting involving the proximal and mid left anterior descending coronary artery has been performed. Global cardiac size within normal limits. Moderate calcification of the mitral valve annulus. Mild calcification of the aortic valve leaflets. No pericardial effusion. Central pulmonary arteries are moderately enlarged in keeping with changes of pulmonary arterial hypertension. Atherosclerotic calcification is seen within the aortic arch and arch vasculature at their origins. No aneurysm. Mediastinum/Nodes: Shotty precarinal and aortopulmonary lymphadenopathy has progressed since prior examination, likely reactive in nature. No frankly pathologic adenopathy is identified within the thorax. Lungs/Pleura: There is severe centrilobular emphysema again identified. The lungs are symmetrically hyperinflated. Bullous change noted at the lung apices. There is superimpose infiltrate noted within the upper lobes bilaterally, asymmetrically more severe within the left, most in keeping with superimposed acute infection. An 8 mm nodule has developed within the right upper lobe, on image 57/3, new since prior examination and indeterminate. 11 mm nodular lesion within the right upper lobe, axial image 50 is stable.3 mm nodule within the left lower lobe, image 47/3, is also stable and safely considered benign Musculoskeletal: No lytic or blastic bone lesions. CT  ABDOMEN PELVIS FINDINGS Hepatobiliary: Cholecystectomy has been performed. Marked extrahepatic and moderate intrahepatic biliary ductal dilation may represent post cholecystectomy change, but appears progressive since prior examination. The liver is otherwise unremarkable. Pancreas: Unremarkable Spleen: Unremarkable Adrenals/Urinary Tract: The adrenal glands are unremarkable. The kidneys are normal in size and position. There is asymmetric mild  left renal cortical atrophy. Multiple nonobstructing calculi are identified bilaterally measuring up to 7 mm within the lower pole of the left kidney. There is no hydronephrosis. No ureteral calculi. The bladder is unremarkable. Stomach/Bowel: Surgical changes of partial small bowel resection are identified. The small bowel demonstrates several proximal loops of fluid-filled marginally dilated loops without a discrete point of transition, a nonspecific finding that can be seen in the setting of infectious or inflammatory enteritis. There is, however, no evidence of obstruction. There is no free intraperitoneal gas or fluid. The distal colon and rectum demonstrate air-fluid levels, a finding that may present clinically as diarrhea. The stomach is unremarkable. Vascular/Lymphatic: There is extensive aortoiliac atherosclerotic calcification. 2.9 cm infrarenal abdominal aortic aneurysm is present. There is no pathologic adenopathy within the abdomen and pelvis. Reproductive: Unremarkable Other: Diffuse body wall wasting noted. Musculoskeletal: Osseous structures are age-appropriate. No lytic or blastic bone lesions. IMPRESSION: Severe emphysema. Superimposed bilateral upper lobe pneumonic infiltrate, asymmetrically more severe within the left upper lobe. No central obstructing mass. New 8 mm pulmonary nodule within the right upper lobe. Non-contrast chest CT at 6-12 months is recommended. If the nodule is stable at time of repeat CT, then future CT at 18-24 months (from today's scan) is considered optional for low-risk patients, but is recommended for high-risk patients. This recommendation follows the consensus statement: Guidelines for Management of Incidental Pulmonary Nodules Detected on CT Images: From the Fleischner Society 2017; Radiology 2017; 284:228-243. Bilateral nonobstructing nephrolithiasis. Coronary artery and peripheral vascular disease. 2.9 cm infrarenal abdominal aortic aneurysm. Aortic Atherosclerosis  (ICD10-I70.0) and Emphysema (ICD10-J43.9). Electronically Signed   By: Fidela Salisbury MD   On: 05/31/2020 17:41   CT Chest Wo Contrast  Result Date: 05/31/2020 CLINICAL DATA:  Pneumonia, nausea, diarrhea EXAM: CT CHEST, ABDOMEN AND PELVIS WITHOUT CONTRAST TECHNIQUE: Multidetector CT imaging of the chest, abdomen and pelvis was performed following the standard protocol without IV contrast. COMPARISON:  None. 07/06/2016 FINDINGS: CT CHEST FINDINGS Cardiovascular: There is extensive coronary artery calcification identified. Coronary artery stenting involving the proximal and mid left anterior descending coronary artery has been performed. Global cardiac size within normal limits. Moderate calcification of the mitral valve annulus. Mild calcification of the aortic valve leaflets. No pericardial effusion. Central pulmonary arteries are moderately enlarged in keeping with changes of pulmonary arterial hypertension. Atherosclerotic calcification is seen within the aortic arch and arch vasculature at their origins. No aneurysm. Mediastinum/Nodes: Shotty precarinal and aortopulmonary lymphadenopathy has progressed since prior examination, likely reactive in nature. No frankly pathologic adenopathy is identified within the thorax. Lungs/Pleura: There is severe centrilobular emphysema again identified. The lungs are symmetrically hyperinflated. Bullous change noted at the lung apices. There is superimpose infiltrate noted within the upper lobes bilaterally, asymmetrically more severe within the left, most in keeping with superimposed acute infection. An 8 mm nodule has developed within the right upper lobe, on image 57/3, new since prior examination and indeterminate. 11 mm nodular lesion within the right upper lobe, axial image 50 is stable.3 mm nodule within the left lower lobe, image 47/3, is also stable and safely considered benign Musculoskeletal: No lytic or blastic bone lesions. CT ABDOMEN PELVIS FINDINGS  Hepatobiliary: Cholecystectomy has been performed. Marked extrahepatic and moderate intrahepatic biliary ductal dilation may represent post cholecystectomy change, but appears progressive since prior examination. The liver is otherwise unremarkable. Pancreas: Unremarkable Spleen: Unremarkable Adrenals/Urinary Tract: The adrenal glands are unremarkable. The kidneys are normal in size and position. There is asymmetric mild left renal cortical atrophy. Multiple nonobstructing calculi are identified bilaterally measuring up to 7 mm within the lower pole of the left kidney. There is no hydronephrosis. No ureteral calculi. The bladder is unremarkable. Stomach/Bowel: Surgical changes of partial small bowel resection are identified. The small bowel demonstrates several proximal loops of fluid-filled marginally dilated loops without a discrete point of transition, a nonspecific finding that can be seen in the setting of infectious or inflammatory enteritis. There is, however, no evidence of obstruction. There is no free intraperitoneal gas or fluid. The distal colon and rectum demonstrate air-fluid levels, a finding that may present clinically as diarrhea. The stomach is unremarkable. Vascular/Lymphatic: There is extensive aortoiliac atherosclerotic calcification. 2.9 cm infrarenal abdominal aortic aneurysm is present. There is no pathologic adenopathy within the abdomen and pelvis. Reproductive: Unremarkable Other: Diffuse body wall wasting noted. Musculoskeletal: Osseous structures are age-appropriate. No lytic or blastic bone lesions. IMPRESSION: Severe emphysema. Superimposed bilateral upper lobe pneumonic infiltrate, asymmetrically more severe within the left upper lobe. No central obstructing mass. New 8 mm pulmonary nodule within the right upper lobe. Non-contrast chest CT at 6-12 months is recommended. If the nodule is stable at time of repeat CT, then future CT at 18-24 months (from today's scan) is considered  optional for low-risk patients, but is recommended for high-risk patients. This recommendation follows the consensus statement: Guidelines for Management of Incidental Pulmonary Nodules Detected on CT Images: From the Fleischner Society 2017; Radiology 2017; 284:228-243. Bilateral nonobstructing nephrolithiasis. Coronary artery and peripheral vascular disease. 2.9 cm infrarenal abdominal aortic aneurysm. Aortic Atherosclerosis (ICD10-I70.0) and Emphysema (ICD10-J43.9). Electronically Signed   By: Fidela Salisbury MD   On: 05/31/2020 17:41    Procedures Procedures (including critical care time)  Medications Ordered in ED Medications  metroNIDAZOLE (FLAGYL) IVPB 500 mg (has no administration in time range)  vancomycin (VANCOCIN) IVPB 1000 mg/200 mL premix (has no administration in time range)  ceFEPIme (MAXIPIME) 2 g in sodium chloride 0.9 % 100 mL IVPB (has no administration in time range)  vancomycin variable dose per unstable renal function (pharmacist dosing) (has no administration in time range)  sodium chloride 0.9 % bolus 1,000 mL (0 mLs Intravenous Stopped 05/31/20 1831)  ceFEPIme (MAXIPIME) 2 g in sodium chloride 0.9 % 100 mL IVPB (2 g Intravenous New Bag/Given 05/31/20 1826)    ED Course  I have reviewed the triage vital signs and the nursing notes.  Pertinent labs & imaging results that were available during my care of the patient were reviewed by me and considered in my medical decision making (see chart for details).    MDM Rules/Calculators/A&P                          86 year old male presenting with diarrhea after multiple rounds of antibiotics.  Recently diagnosed with pneumonia as well and has been feeling generally weak for about 1 week.  Reviewed/interpreted labs CBC with leukocytosis of 23,000, thrombocytosis which is new CMP with hyponatremia, low bicarb at 15, no elevated anion gap.  Elevated BUN/creatinine at 36/3.12 which is new from prior.  LFTs are normal.  - ARF  likely prerenal 2/2 dehydration Lipase wnl  Mg slightly elevated, h/o same UA with hematuria and proteinuria.  No leukocytes or nitrates.   Hemoccult positive Trop neg Lactic neg Cdif neg Blood cultures obtained.  COVID neg  CT chest/abd/pelvis with severe emphysema. Superimposed bilateral upper lobe pneumonic infiltrate, asymmetrically more severe within the left upper lobe. No central obstructing mass. New 8 mm pulmonary nodule within the right upper lobe.  Bilateral nonobstructing nephrolithiasis. Coronary artery and peripheral vascular disease. 2.9 cm infrarenal abdominal aortic aneurysm. Aortic Atherosclerosis   Patient given broad-spectrum antibiotics, IV fluids.  We will plan for admission to the hospital for further tx of pneumonia after failure outpatient therapy and for acute renal failure.  7:17 PM CONSULT with Dr. Myna Hidalgo who accepts patient for admission at [facility]  Final Clinical Impression(s) / ED Diagnoses Final diagnoses:  Community acquired pneumonia, unspecified laterality  Acute renal failure, unspecified acute renal failure type [facility])    Rx / DC Orders ED Discharge Orders    None       Rodney Booze, PA-C 05/31/20 1918    Rodney Booze, PA-C 05/31/20 1920    Drenda Freeze, MD 06/01/20 [phone]

## 2020-05-31 NOTE — ED Notes (Signed)
Pt on monitor 

## 2020-05-31 NOTE — ED Notes (Signed)
Pt. Fluid to be started when he returns from CT scan.

## 2020-06-01 ENCOUNTER — Encounter (HOSPITAL_COMMUNITY): Payer: Self-pay | Admitting: Family Medicine

## 2020-06-01 DIAGNOSIS — D509 Iron deficiency anemia, unspecified: Secondary | ICD-10-CM | POA: Diagnosis not present

## 2020-06-01 DIAGNOSIS — K5790 Diverticulosis of intestine, part unspecified, without perforation or abscess without bleeding: Secondary | ICD-10-CM | POA: Diagnosis present

## 2020-06-01 DIAGNOSIS — R748 Abnormal levels of other serum enzymes: Secondary | ICD-10-CM

## 2020-06-01 DIAGNOSIS — I5022 Chronic systolic (congestive) heart failure: Secondary | ICD-10-CM

## 2020-06-01 DIAGNOSIS — E871 Hypo-osmolality and hyponatremia: Secondary | ICD-10-CM | POA: Diagnosis present

## 2020-06-01 DIAGNOSIS — R195 Other fecal abnormalities: Secondary | ICD-10-CM | POA: Diagnosis present

## 2020-06-01 DIAGNOSIS — N179 Acute kidney failure, unspecified: Secondary | ICD-10-CM | POA: Diagnosis present

## 2020-06-01 DIAGNOSIS — K562 Volvulus: Secondary | ICD-10-CM | POA: Diagnosis present

## 2020-06-01 DIAGNOSIS — C9211 Chronic myeloid leukemia, BCR/ABL-positive, in remission: Secondary | ICD-10-CM | POA: Diagnosis present

## 2020-06-01 DIAGNOSIS — K529 Noninfective gastroenteritis and colitis, unspecified: Secondary | ICD-10-CM | POA: Diagnosis present

## 2020-06-01 DIAGNOSIS — I132 Hypertensive heart and chronic kidney disease with heart failure and with stage 5 chronic kidney disease, or end stage renal disease: Secondary | ICD-10-CM | POA: Diagnosis present

## 2020-06-01 DIAGNOSIS — K2980 Duodenitis without bleeding: Secondary | ICD-10-CM | POA: Diagnosis present

## 2020-06-01 DIAGNOSIS — K253 Acute gastric ulcer without hemorrhage or perforation: Secondary | ICD-10-CM | POA: Diagnosis not present

## 2020-06-01 DIAGNOSIS — K5712 Diverticulitis of small intestine without perforation or abscess without bleeding: Secondary | ICD-10-CM | POA: Diagnosis present

## 2020-06-01 DIAGNOSIS — R112 Nausea with vomiting, unspecified: Secondary | ICD-10-CM | POA: Diagnosis not present

## 2020-06-01 DIAGNOSIS — K2270 Barrett's esophagus without dysplasia: Secondary | ICD-10-CM | POA: Diagnosis not present

## 2020-06-01 DIAGNOSIS — I2510 Atherosclerotic heart disease of native coronary artery without angina pectoris: Secondary | ICD-10-CM | POA: Diagnosis present

## 2020-06-01 DIAGNOSIS — K2901 Acute gastritis with bleeding: Secondary | ICD-10-CM | POA: Diagnosis present

## 2020-06-01 DIAGNOSIS — I255 Ischemic cardiomyopathy: Secondary | ICD-10-CM | POA: Diagnosis present

## 2020-06-01 DIAGNOSIS — J189 Pneumonia, unspecified organism: Secondary | ICD-10-CM | POA: Diagnosis present

## 2020-06-01 DIAGNOSIS — R911 Solitary pulmonary nodule: Secondary | ICD-10-CM | POA: Diagnosis present

## 2020-06-01 DIAGNOSIS — K269 Duodenal ulcer, unspecified as acute or chronic, without hemorrhage or perforation: Secondary | ICD-10-CM | POA: Diagnosis present

## 2020-06-01 DIAGNOSIS — K449 Diaphragmatic hernia without obstruction or gangrene: Secondary | ICD-10-CM | POA: Diagnosis present

## 2020-06-01 DIAGNOSIS — K2900 Acute gastritis without bleeding: Secondary | ICD-10-CM | POA: Diagnosis not present

## 2020-06-01 DIAGNOSIS — N1832 Chronic kidney disease, stage 3b: Secondary | ICD-10-CM | POA: Diagnosis present

## 2020-06-01 DIAGNOSIS — R64 Cachexia: Secondary | ICD-10-CM | POA: Diagnosis present

## 2020-06-01 DIAGNOSIS — D62 Acute posthemorrhagic anemia: Secondary | ICD-10-CM | POA: Diagnosis present

## 2020-06-01 DIAGNOSIS — E872 Acidosis: Secondary | ICD-10-CM | POA: Diagnosis present

## 2020-06-01 DIAGNOSIS — R935 Abnormal findings on diagnostic imaging of other abdominal regions, including retroperitoneum: Secondary | ICD-10-CM | POA: Diagnosis not present

## 2020-06-01 DIAGNOSIS — N183 Chronic kidney disease, stage 3 unspecified: Secondary | ICD-10-CM | POA: Diagnosis not present

## 2020-06-01 DIAGNOSIS — K22719 Barrett's esophagus with dysplasia, unspecified: Secondary | ICD-10-CM | POA: Diagnosis present

## 2020-06-01 DIAGNOSIS — K228 Other specified diseases of esophagus: Secondary | ICD-10-CM | POA: Diagnosis not present

## 2020-06-01 DIAGNOSIS — Z20822 Contact with and (suspected) exposure to covid-19: Secondary | ICD-10-CM | POA: Diagnosis present

## 2020-06-01 LAB — CBC WITH DIFFERENTIAL/PLATELET
Abs Immature Granulocytes: 0.21 10*3/uL — ABNORMAL HIGH (ref 0.00–0.07)
Basophils Absolute: 0.1 10*3/uL (ref 0.0–0.1)
Basophils Relative: 0 %
Eosinophils Absolute: 0.4 10*3/uL (ref 0.0–0.5)
Eosinophils Relative: 3 %
HCT: 31.5 % — ABNORMAL LOW (ref 39.0–52.0)
Hemoglobin: 9.7 g/dL — ABNORMAL LOW (ref 13.0–17.0)
Immature Granulocytes: 2 %
Lymphocytes Relative: 9 %
Lymphs Abs: 1.1 10*3/uL (ref 0.7–4.0)
MCH: 28 pg (ref 26.0–34.0)
MCHC: 30.8 g/dL (ref 30.0–36.0)
MCV: 90.8 fL (ref 80.0–100.0)
Monocytes Absolute: 1.4 10*3/uL — ABNORMAL HIGH (ref 0.1–1.0)
Monocytes Relative: 12 %
Neutro Abs: 9.1 10*3/uL — ABNORMAL HIGH (ref 1.7–7.7)
Neutrophils Relative %: 74 %
Platelets: 379 10*3/uL (ref 150–400)
RBC: 3.47 MIL/uL — ABNORMAL LOW (ref 4.22–5.81)
RDW: 16.5 % — ABNORMAL HIGH (ref 11.5–15.5)
WBC: 12.3 10*3/uL — ABNORMAL HIGH (ref 4.0–10.5)
nRBC: 0 % (ref 0.0–0.2)

## 2020-06-01 LAB — CBC
HCT: 27.2 % — ABNORMAL LOW (ref 39.0–52.0)
HCT: 26.9 % — ABNORMAL LOW (ref 39.0–52.0)
Hemoglobin: 8.9 g/dL — ABNORMAL LOW (ref 13.0–17.0)
Hemoglobin: 8.8 g/dL — ABNORMAL LOW (ref 13.0–17.0)
MCH: 28.3 pg (ref 26.0–34.0)
MCH: 28.9 pg (ref 26.0–34.0)
MCHC: 32.7 g/dL (ref 30.0–36.0)
MCHC: 32.7 g/dL (ref 30.0–36.0)
MCV: 86.3 fL (ref 80.0–100.0)
MCV: 88.2 fL (ref 80.0–100.0)
Platelets: 340 10*3/uL (ref 150–400)
Platelets: 334 10*3/uL (ref 150–400)
RBC: 3.15 MIL/uL — ABNORMAL LOW (ref 4.22–5.81)
RBC: 3.05 MIL/uL — ABNORMAL LOW (ref 4.22–5.81)
RDW: 16.1 % — ABNORMAL HIGH (ref 11.5–15.5)
RDW: 16.3 % — ABNORMAL HIGH (ref 11.5–15.5)
WBC: 11.1 10*3/uL — ABNORMAL HIGH (ref 4.0–10.5)
WBC: 12.4 10*3/uL — ABNORMAL HIGH (ref 4.0–10.5)
nRBC: 0 % (ref 0.0–0.2)
nRBC: 0 % (ref 0.0–0.2)

## 2020-06-01 LAB — COMPREHENSIVE METABOLIC PANEL
ALT: 20 U/L (ref 0–44)
AST: 40 U/L (ref 15–41)
Albumin: 2.4 g/dL — ABNORMAL LOW (ref 3.5–5.0)
Alkaline Phosphatase: 272 U/L — ABNORMAL HIGH (ref 38–126)
Anion gap: 10 (ref 5–15)
BUN: 30 mg/dL — ABNORMAL HIGH (ref 8–23)
CO2: 14 mmol/L — ABNORMAL LOW (ref 22–32)
Calcium: 7.4 mg/dL — ABNORMAL LOW (ref 8.9–10.3)
Chloride: 109 mmol/L (ref 98–111)
Creatinine, Ser: 2.37 mg/dL — ABNORMAL HIGH (ref 0.61–1.24)
GFR calc Af Amer: 28 mL/min — ABNORMAL LOW (ref >60)
GFR calc non Af Amer: 24 mL/min — ABNORMAL LOW (ref >60)
Glucose, Bld: 127 mg/dL — ABNORMAL HIGH (ref 70–99)
Potassium: 4.4 mmol/L (ref 3.5–5.1)
Sodium: 133 mmol/L — ABNORMAL LOW (ref 135–145)
Total Bilirubin: 1.2 mg/dL (ref 0.3–1.2)
Total Protein: 6.1 g/dL — ABNORMAL LOW (ref 6.5–8.1)

## 2020-06-01 LAB — MRSA PCR SCREENING: MRSA by PCR: NEGATIVE

## 2020-06-01 LAB — LACTIC ACID, PLASMA: Lactic Acid, Venous: 0.7 mmol/L (ref 0.5–1.9)

## 2020-06-01 MED ORDER — ASPIRIN EC 81 MG PO TBEC
81.0000 mg | DELAYED_RELEASE_TABLET | Freq: Every day | ORAL | Status: DC
  Administered 2020-06-01: 81 mg via ORAL
  Filled 2020-06-01: qty 1

## 2020-06-01 MED ORDER — HYDRALAZINE HCL 20 MG/ML IJ SOLN: 10.0000 mg | INTRAMUSCULAR | Status: DC | PRN

## 2020-06-01 MED ORDER — CARVEDILOL 12.5 MG PO TABS
12.5000 mg | ORAL_TABLET | Freq: Two times a day (BID) | ORAL | Status: DC
  Administered 2020-06-01 – 2020-06-09 (×9): 12.5 mg via ORAL
  Filled 2020-06-01 (×15): qty 1

## 2020-06-01 MED ORDER — ONDANSETRON HCL 4 MG/2ML IJ SOLN
4.0000 mg | Freq: Four times a day (QID) | INTRAMUSCULAR | Status: DC | PRN
  Filled 2020-06-01: qty 2

## 2020-06-01 MED ORDER — SODIUM BICARBONATE-DEXTROSE 150-5 MEQ/L-% IV SOLN
150.0000 meq | INTRAVENOUS | Status: DC
  Administered 2020-06-01 – 2020-06-05 (×8): 150 meq via INTRAVENOUS
  Filled 2020-06-01 (×11): qty 1000

## 2020-06-01 MED ORDER — EZETIMIBE 10 MG PO TABS
10.0000 mg | ORAL_TABLET | Freq: Every day | ORAL | Status: DC
  Administered 2020-06-01 – 2020-06-09 (×8): 10 mg via ORAL
  Filled 2020-06-01 (×8): qty 1

## 2020-06-01 MED ORDER — PANTOPRAZOLE SODIUM 40 MG PO TBEC
40.0000 mg | DELAYED_RELEASE_TABLET | Freq: Two times a day (BID) | ORAL | Status: DC
  Administered 2020-06-01 – 2020-06-09 (×15): 40 mg via ORAL
  Filled 2020-06-01 (×17): qty 1

## 2020-06-01 MED ORDER — HEPARIN SODIUM (PORCINE) 5000 UNIT/ML IJ SOLN
5000.0000 [IU] | Freq: Three times a day (TID) | INTRAMUSCULAR | Status: DC
  Administered 2020-06-01: 5000 [IU] via SUBCUTANEOUS
  Filled 2020-06-01: qty 1

## 2020-06-01 MED ORDER — ONDANSETRON HCL 4 MG PO TABS
4.0000 mg | ORAL_TABLET | Freq: Four times a day (QID) | ORAL | Status: DC | PRN
  Administered 2020-06-03: 4 mg via ORAL

## 2020-06-01 MED ORDER — SODIUM CHLORIDE 0.9 % IV SOLN: INTRAVENOUS | Status: DC

## 2020-06-01 MED ORDER — SODIUM BICARBONATE 8.4 % IV SOLN: INTRAVENOUS | Status: DC

## 2020-06-01 NOTE — H&P (Signed)
History and Physical    [name] [name] URK:[id number] DOB: [birth year] DOA: 05/31/2020  PCP: Drake Leach, MD  Patient coming from: Home.  Chief Complaint: Diarrhea.  HPI: [name] [name] is a 86 y.o. male with history of CAD status post stenting, CHF, CML, chronic kidney disease stage III who has 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  After 4 days patient again had to go to his primary care because symptoms did not improve and [name] was added to his amoxicillin.  On Sunday there is 48 hours ago patient had multiple episodes of diarrhea at least 10 episodes with some vomiting.  Denies any abdominal pain.  He took Imodium yesterday morning following which his diarrhea stopped but after about 8 hours later he started having diarrhea again.  At this point he decided to come to the ER.  Denies chest pain or shortness of breath.  ED Course: In the ER patient had blood work done which showed marked leukocytosis of 23,000 creatinine worsened from 1.6-3.12 CT chest abdomen pelvis was done which shows bilateral upper lobe infiltrates.  CT abdomen was largely unremarkable except for small abdominal aneurysm.  Patient was started on empiric antibiotics and admitted for further management of acute renal failure with diarrhea.  Stool for occult blood was positive but patient did not see any obvious bleeding.  Review of Systems: As per HPI, rest all negative.   Past Medical History:  Diagnosis Date  . Carotid artery occlusion   . Chronic systolic CHF (congestive heart failure) ([place])    a. 07/2013 Echo: EF 35-40%  . CKD (chronic kidney disease), stage III    pt. states that he does not have CKD  . CML (chronic myelocytic leukemia) ([place])   . Coronary artery disease    a. 09/2013 Cath: LM min  irregs, LAD 95p(2.75x20 Promus Premier DES & 3.0x20 Promus Premier DES overlapping), D1 50-60, LCX 30p, OM1 42m, OM2 60-70p, RCA small, nl.  . Diverticulitis   . Dysphonia 01/08/2017  . History of hiatal hernia   . History of kidney stones   . History of small bowel obstruction   . Hyperlipidemia    denies  . Ischemic cardiomyopathy    a. 07/2013 Echo: EF 35-40%, apical septal, apical lat, apical AK, mod MR.  . Small bowel perforation ([place])    a. 11/2012 s/p emergent SB resection.  . 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 BOWEL RESECTION     3cm sm intestine  . CARDIAC CATHETERIZATION  10/09/2013  . CAROTID ENDARTERECTOMY    . CHOLECYSTECTOMY    . COLON SURGERY  11/2012   BOWEL SURGERY   . CORONARY ANGIOPLASTY  10/09/2013   MID LAD  . ENDARTERECTOMY Right 09/17/2015   Procedure: Right Carotid ENDARTERECTOMY with Xenosure Patch Angioplasty;  Surgeon: Serafina Mitchell, MD;  Location: [facility];  Service: Vascular;  Laterality: Right;  . EYE SURGERY Bilateral   . INGUINAL HERNIA REPAIR Bilateral   . LAPAROSCOPIC CHOLECYSTECTOMY    . LAPAROTOMY N/A 10/14/2017   Procedure: EXPLORATORY LAPAROTOMY for SMALL BOWEL OBSTRUCTION, cecopexy;  Surgeon: Excell Seltzer, MD;  Location: [facility];  Service: General;  Laterality: N/A;  . LAPAROTOMY N/A 11/09/2017   Procedure: EXPLORATORY LAPAROTOMY reduction small bowel  valvulous;  Surgeon: Rolm Bookbinder, MD;  Location: [facility];  Service: General;  Laterality: N/A;  . LAPAROTOMY N/A 01/10/2018   Procedure: exploratory laparotomy, lysis of adhesions, decompression of midgut volvulus;  Surgeon: Jovita Kussmaul, MD;  Location: [facility];  Service: General;  Laterality: N/A;  . LAPAROTOMY N/A 08/21/2018   Procedure: EXPLORATORY LAPAROTOMY;  Surgeon: Excell Seltzer, MD;  Location: [facility];  Service: General;  Laterality: N/A;  . LEFT HEART CATHETERIZATION WITH CORONARY ANGIOGRAM N/A 10/09/2013   Procedure: LEFT  HEART CATHETERIZATION WITH CORONARY ANGIOGRAM;  Surgeon: Larey Dresser, MD;  Location: [facility];  Service: Cardiovascular;  Laterality: N/A;  . PERCUTANEOUS CORONARY STENT INTERVENTION (PCI-S)  10/09/2013   Procedure: PERCUTANEOUS CORONARY STENT INTERVENTION (PCI-S);  Surgeon: Larey Dresser, MD;  Location: [facility] C[facility] LAB;  Service: Cardiovascular;;     reports that he quit smoking about 20 years ago. His smoking use included cigarettes. He has a 45.00 pack-year smoking history. He has never used smokeless tobacco. He reports that he does not drink alcohol and does not use drugs.  Allergies  Allergen Reactions  . Percocet [Oxycodone-Acetaminophen] Itching  . Metronidazole Other (See Comments)    Insomnia and nervousness    Family History  Problem Relation Age of Onset  . Emphysema Father        died @ 29- smoked  . Heart disease Father   . Kidney Stones Father   . Diabetes Mother        died @ 20  . Stroke Mother   . Head & neck cancer Brother   . Lymphoma Brother   . Colon cancer Neg Hx   . Stomach cancer Neg Hx   . Rectal cancer Neg Hx   . Esophageal cancer Neg Hx   . Liver cancer Neg Hx     Prior to Admission medications   Medication Sig Start Date End Date Taking? Authorizing Provider  Ascorbic Acid (VITAMIN C) 500 MG CAPS Take by mouth daily.    [provider]  aspirin EC 81 MG tablet Take 1 tablet (81 mg total) by mouth daily. 10/02/13   Larey Dresser, MD  BOSULIF 400 MG tablet Take 400 mg by mouth daily with breakfast.    [provider]  Calcium Carbonate (CALCIUM 600 PO) Take by mouth.    [provider]  carvedilol (COREG) 12.5 MG tablet TAKE ONE TABLET BY MOUTH TWICE A DAY 05/21/20   Larey Dresser, MD  cyanocobalamin 1000 MCG tablet Take 1,000 mcg by mouth daily.    [provider]  ezetimibe (ZETIA) 10 MG tablet Take 1 tablet (10 mg total) by mouth daily. 04/09/20   Larey Dresser, MD  famotidine (PEPCID) 20 MG tablet  Take 20 mg by mouth as needed.     [provider]  Ferrous Sulfate (IRON) 325 (65 Fe) MG TABS Take by mouth.    [provider]  furosemide (LASIX) 20 MG tablet Take 20 mg daily for 3 days as needed if  weight gain of 3 lbs in a week or 2 lbs in 24 hrs. 04/06/20   Larey Dresser, MD  loperamide (IMODIUM) 2 MG capsule Take by mouth as needed for diarrhea or loose stools.    [provider]  losartan (COZAAR) 25 MG tablet Take 0.5 tablets (12.5 mg total) by mouth daily. 03/15/20   Larey Dresser, MD  Simethicone (GAS-X PO) Take by mouth daily as needed.    [provider]    Physical Exam: Constitutional: Moderately built and nourished. Vitals:   05/31/20 2335 05/31/20 2336 05/31/20 2350 06/01/20 0041  BP:   (!) 139/59 (!) 144/73  Pulse: 69 71 72 69  Resp: 19 15 19 18   Temp:   98 F (36.7 C) 98 F (36.7 C)  TempSrc:   Rectal Oral  SpO2: 100% 99% 100% 99%  Weight:   53.2 kg   Height:   5\' 9"  (1.753 m)    Eyes: Anicteric no pallor. ENMT: No discharge from the ears eyes nose or mouth. Neck: No mass felt.  No neck rigidity. Respiratory: No rhonchi or crepitations. Cardiovascular: S1-S2 heard. Abdomen: Soft nontender bowel sounds present. Musculoskeletal: No edema. Skin: No rash. Neurologic: Alert awake oriented to time place and person.  Moves all extremities. Psychiatric: Appears normal per normal affect.   Labs on Admission: I have personally reviewed following labs and imaging studies  CBC: Recent Labs  Lab 05/31/20 1402  WBC 23.8*  HGB 13.3  HCT 43.0  MCV 90.5  PLT 789*   Basic Metabolic Panel: Recent Labs  Lab 05/31/20 1402  NA 130*  K 4.9  CL 103  CO2 15*  GLUCOSE 97  BUN 36*  CREATININE 3.12*  CALCIUM 8.2*  MG 2.6*   GFR: Estimated Creatinine Clearance: 12.6 mL/min (A) (by C-G formula based on SCr of 3.12 mg/dL (H)). Liver Function Tests: Recent Labs  Lab 05/31/20 1402  AST 17  ALT 18  ALKPHOS 184*  BILITOT  0.4  PROT 8.7*  ALBUMIN 3.5   Recent Labs  Lab 05/31/20 1402  LIPASE 18   No results for input(s): AMMONIA in the last 168 hours. Coagulation Profile: No results for input(s): INR, PROTIME in the last 168 hours. Cardiac Enzymes: No results for input(s): CKTOTAL, CKMB, CKMBINDEX, TROPONINI in the last 168 hours. BNP (last 3 results) No results for input(s): PROBNP in the last 8760 hours. HbA1C: No results for input(s): HGBA1C in the last 72 hours. CBG: No results for input(s): GLUCAP in the last 168 hours. Lipid Profile: No results for input(s): CHOL, HDL, LDLCALC, TRIG, CHOLHDL, LDLDIRECT in the last 72 hours. Thyroid Function Tests: No results for input(s): TSH, T4TOTAL, FREET4, T3FREE, THYROIDAB in the last 72 hours. Anemia Panel: No results for input(s): VITAMINB12, FOLATE, FERRITIN, TIBC, IRON, RETICCTPCT in the last 72 hours. Urine analysis:    Component Value Date/Time   COLORURINE YELLOW 05/31/2020 1405   APPEARANCEUR CLEAR 05/31/2020 1405   LABSPEC >1.030 (H) 05/31/2020 1405   PHURINE 5.0 05/31/2020 1405   GLUCOSEU NEGATIVE 05/31/2020 1405   HGBUR SMALL (A) 05/31/2020 1405   BILIRUBINUR NEGATIVE 05/31/2020 1405   KETONESUR NEGATIVE 05/31/2020 1405   PROTEINUR 30 (A) 05/31/2020 1405   UROBILINOGEN 0.2 09/06/2015 1935   NITRITE NEGATIVE 05/31/2020 1405   LEUKOCYTESUR NEGATIVE 05/31/2020 1405   Sepsis Labs: @LABRCNTIP (procalcitonin:4,lacticidven:4) ) Recent Results (from the past 240 hour(s))  C Difficile Quick Screen w PCR reflex     Status: None   Collection Time: 05/31/20  4:17 PM   Specimen: STOOL  Result Value Ref Range Status   C Diff antigen NEGATIVE NEGATIVE Final   C Diff toxin NEGATIVE NEGATIVE Final   C Diff interpretation No C. difficile detected.  Final    Comment: Performed at [facility], [place] [address], [place], [place] [address]  SARS Coronavirus 2 by RT PCR (hospital order, performed in [facility] hospital lab) Nasopharyngeal      Status:  None   Collection Time: 05/31/20  4:17 PM   Specimen: Nasopharyngeal  Result Value Ref Range Status   SARS Coronavirus 2 NEGATIVE NEGATIVE Final    Comment: (NOTE) SARS-CoV-2 target nucleic acids are NOT DETECTED.  The SARS-CoV-2 RNA is generally detectable in upper and lower respiratory specimens during the acute phase of infection. The lowest concentration of SARS-CoV-2 viral copies this assay can detect is 250 copies / mL. A negative result does not preclude SARS-CoV-2 infection and should not be used as the sole basis for treatment or other patient management decisions.  A negative result may occur with improper specimen collection / handling, submission of specimen other than nasopharyngeal swab, presence of viral mutation(s) within the areas targeted by this assay, and inadequate number of viral copies (<250 copies / mL). A negative result must be combined with clinical observations, patient history, and epidemiological information.  Fact Sheet for Patients:   StrictlyIdeas.no  Fact Sheet for Healthcare Providers: BankingDealers.co.za  This test is not yet approved or  cleared by the [place] FDA and has been authorized for detection and/or diagnosis of SARS-CoV-2 by FDA under an Emergency Use Authorization (EUA).  This EUA will remain in effect (meaning this test can be used) for the duration of the COVID-19 declaration under Section 564(b)(1) of the Act, 21 U.S.C. section 360bbb-3(b)(1), unless the authorization is terminated or revoked sooner.  Performed at [facility], [address] [place], [place] [address]      Radiological Exams on Admission: CT ABDOMEN PELVIS WO CONTRAST  Result Date: 05/31/2020 CLINICAL DATA:  Pneumonia, nausea, diarrhea EXAM: CT CHEST, ABDOMEN AND PELVIS WITHOUT CONTRAST TECHNIQUE: Multidetector CT imaging of the chest, abdomen and pelvis was performed following the  standard protocol without IV contrast. COMPARISON:  None. 07/06/2016 FINDINGS: CT CHEST FINDINGS Cardiovascular: There is extensive coronary artery calcification identified. Coronary artery stenting involving the proximal and mid left anterior descending coronary artery has been performed. Global cardiac size within normal limits. Moderate calcification of the mitral valve annulus. Mild calcification of the aortic valve leaflets. No pericardial effusion. Central pulmonary arteries are moderately enlarged in keeping with changes of pulmonary arterial hypertension. Atherosclerotic calcification is seen within the aortic arch and arch vasculature at their origins. No aneurysm. Mediastinum/Nodes: Shotty precarinal and aortopulmonary lymphadenopathy has progressed since prior examination, likely reactive in nature. No frankly pathologic adenopathy is identified within the thorax. Lungs/Pleura: There is severe centrilobular emphysema again identified. The lungs are symmetrically hyperinflated. Bullous change noted at the lung apices. There is superimpose infiltrate noted within the upper lobes bilaterally, asymmetrically more severe within the left, most in keeping with superimposed acute infection. An 8 mm nodule has developed within the right upper lobe, on image 57/3, new since prior examination and indeterminate. 11 mm nodular lesion within the right upper lobe, axial image 50 is stable.3 mm nodule within the left lower lobe, image 47/3, is also stable and safely considered benign Musculoskeletal: No lytic or blastic bone lesions. CT ABDOMEN PELVIS FINDINGS Hepatobiliary: Cholecystectomy has been performed. Marked extrahepatic and moderate intrahepatic biliary ductal dilation may represent post cholecystectomy change, but appears progressive since prior examination. The liver is otherwise unremarkable. Pancreas: Unremarkable Spleen: Unremarkable Adrenals/Urinary Tract: The adrenal glands are unremarkable. The kidneys  are normal in size and position. There is asymmetric mild left renal cortical atrophy. Multiple nonobstructing calculi are identified bilaterally measuring up to 7 mm within the lower pole of the left kidney. There is no hydronephrosis. No ureteral  calculi. The bladder is unremarkable. Stomach/Bowel: Surgical changes of partial small bowel resection are identified. The small bowel demonstrates several proximal loops of fluid-filled marginally dilated loops without a discrete point of transition, a nonspecific finding that can be seen in the setting of infectious or inflammatory enteritis. There is, however, no evidence of obstruction. There is no free intraperitoneal gas or fluid. The distal colon and rectum demonstrate air-fluid levels, a finding that may present clinically as diarrhea. The stomach is unremarkable. Vascular/Lymphatic: There is extensive aortoiliac atherosclerotic calcification. 2.9 cm infrarenal abdominal aortic aneurysm is present. There is no pathologic adenopathy within the abdomen and pelvis. Reproductive: Unremarkable Other: Diffuse body wall wasting noted. Musculoskeletal: Osseous structures are age-appropriate. No lytic or blastic bone lesions. IMPRESSION: Severe emphysema. Superimposed bilateral upper lobe pneumonic infiltrate, asymmetrically more severe within the left upper lobe. No central obstructing mass. New 8 mm pulmonary nodule within the right upper lobe. Non-contrast chest CT at 6-12 months is recommended. If the nodule is stable at time of repeat CT, then future CT at 18-24 months (from today's scan) is considered optional for low-risk patients, but is recommended for high-risk patients. This recommendation follows the consensus statement: Guidelines for Management of Incidental Pulmonary Nodules Detected on CT Images: From the Fleischner Society 2017; Radiology 2017; 284:228-243. Bilateral nonobstructing nephrolithiasis. Coronary artery and peripheral vascular disease. 2.9 cm  infrarenal abdominal aortic aneurysm. Aortic Atherosclerosis (ICD10-I70.0) and Emphysema (ICD10-J43.9). Electronically Signed   By: Fidela Salisbury MD   On: 05/31/2020 17:41   CT Chest Wo Contrast  Result Date: 05/31/2020 CLINICAL DATA:  Pneumonia, nausea, diarrhea EXAM: CT CHEST, ABDOMEN AND PELVIS WITHOUT CONTRAST TECHNIQUE: Multidetector CT imaging of the chest, abdomen and pelvis was performed following the standard protocol without IV contrast. COMPARISON:  None. 07/06/2016 FINDINGS: CT CHEST FINDINGS Cardiovascular: There is extensive coronary artery calcification identified. Coronary artery stenting involving the proximal and mid left anterior descending coronary artery has been performed. Global cardiac size within normal limits. Moderate calcification of the mitral valve annulus. Mild calcification of the aortic valve leaflets. No pericardial effusion. Central pulmonary arteries are moderately enlarged in keeping with changes of pulmonary arterial hypertension. Atherosclerotic calcification is seen within the aortic arch and arch vasculature at their origins. No aneurysm. Mediastinum/Nodes: Shotty precarinal and aortopulmonary lymphadenopathy has progressed since prior examination, likely reactive in nature. No frankly pathologic adenopathy is identified within the thorax. Lungs/Pleura: There is severe centrilobular emphysema again identified. The lungs are symmetrically hyperinflated. Bullous change noted at the lung apices. There is superimpose infiltrate noted within the upper lobes bilaterally, asymmetrically more severe within the left, most in keeping with superimposed acute infection. An 8 mm nodule has developed within the right upper lobe, on image 57/3, new since prior examination and indeterminate. 11 mm nodular lesion within the right upper lobe, axial image 50 is stable.3 mm nodule within the left lower lobe, image 47/3, is also stable and safely considered benign Musculoskeletal: No lytic  or blastic bone lesions. CT ABDOMEN PELVIS FINDINGS Hepatobiliary: Cholecystectomy has been performed. Marked extrahepatic and moderate intrahepatic biliary ductal dilation may represent post cholecystectomy change, but appears progressive since prior examination. The liver is otherwise unremarkable. Pancreas: Unremarkable Spleen: Unremarkable Adrenals/Urinary Tract: The adrenal glands are unremarkable. The kidneys are normal in size and position. There is asymmetric mild left renal cortical atrophy. Multiple nonobstructing calculi are identified bilaterally measuring up to 7 mm within the lower pole of the left kidney. There is no hydronephrosis. No ureteral calculi. The bladder is  unremarkable. Stomach/Bowel: Surgical changes of partial small bowel resection are identified. The small bowel demonstrates several proximal loops of fluid-filled marginally dilated loops without a discrete point of transition, a nonspecific finding that can be seen in the setting of infectious or inflammatory enteritis. There is, however, no evidence of obstruction. There is no free intraperitoneal gas or fluid. The distal colon and rectum demonstrate air-fluid levels, a finding that may present clinically as diarrhea. The stomach is unremarkable. Vascular/Lymphatic: There is extensive aortoiliac atherosclerotic calcification. 2.9 cm infrarenal abdominal aortic aneurysm is present. There is no pathologic adenopathy within the abdomen and pelvis. Reproductive: Unremarkable Other: Diffuse body wall wasting noted. Musculoskeletal: Osseous structures are age-appropriate. No lytic or blastic bone lesions. IMPRESSION: Severe emphysema. Superimposed bilateral upper lobe pneumonic infiltrate, asymmetrically more severe within the left upper lobe. No central obstructing mass. New 8 mm pulmonary nodule within the right upper lobe. Non-contrast chest CT at 6-12 months is recommended. If the nodule is stable at time of repeat CT, then future CT at  18-24 months (from today's scan) is considered optional for low-risk patients, but is recommended for high-risk patients. This recommendation follows the consensus statement: Guidelines for Management of Incidental Pulmonary Nodules Detected on CT Images: From the Fleischner Society 2017; Radiology 2017; 284:228-243. Bilateral nonobstructing nephrolithiasis. Coronary artery and peripheral vascular disease. 2.9 cm infrarenal abdominal aortic aneurysm. Aortic Atherosclerosis (ICD10-I70.0) and Emphysema (ICD10-J43.9). Electronically Signed   By: Fidela Salisbury MD   On: 05/31/2020 17:41   EKG -normal sinus rhythm with LVH.  Assessment/Plan Principal Problem:   Acute renal failure superimposed on stage 3 chronic kidney disease (HCC) Active Problems:   Chronic systolic CHF (congestive heart failure) (HCC)   CAD (coronary artery disease)   Lung nodule   Enteritis   ARF (acute renal failure) ([place])    1. Acute on chronic kidney disease stage III likely precipitated by patient's diarrhea and vomiting.  Patient also takes ARB which could have further worsen.  Will hold off ARB patient received fluids in the ER.  Gently hydrate. 2. Diarrhea and vomiting could be from recent use of antibiotics but patient also has history of multiple midgut volvulus CT does not show any signs of that now.  Patient also was recently treated for SIBO.  If symptoms does not improve may consult GI Dr. Havery Moros. 3. Pneumonia presently on antibiotics. 4. CAD status post stenting denies chest pain.  On aspirin Zetia. 5. History of CHF presently holding of ARB due to acute renal failure.  Hold of any diuretics. 6. History of CML on Bosulif.  Since patient has multiple comorbidities with acute on chronic kidney disease will need close monitoring for any further worsening in inpatient status.   DVT prophylaxis: SCDs for now until you make sure there is no significant fall in hemoglobin. Code Status: Full code. Family  Communication: Discussed with patient. Disposition Plan: Home. Consults called: None. Admission status: Inpatient.   Rise Patience MD [facility] Pager [phone].  If 7PM-7AM, please contact night-coverage [email or web address] Password [facility]  06/01/2020, 1:43 AM

## 2020-06-01 NOTE — Plan of Care (Signed)

## 2020-06-01 NOTE — Progress Notes (Addendum)
PROGRESS NOTE    Armin Yerger  ZOX:[id number] DOB: 1930-07-07 DOA: 05/31/2020 PCP: Drake Leach, MD    Brief Narrative:  HPI: Hallis Meditz is a 86 y.o. male with history of CAD status post stenting, CHF, CML, chronic kidney disease stage III who has 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  After 4 days patient again had to go to his primary care because symptoms did not improve and [name] was added to his amoxicillin.  On Sunday there is 48 hours ago patient had multiple episodes of diarrhea at least 10 episodes with some vomiting.  Denies any abdominal pain.  He took Imodium yesterday morning following which his diarrhea stopped but after about 8 hours later he started having diarrhea again.  At this point he decided to come to the ER.  Denies chest pain or shortness of breath. ED Course: In the ER patient had blood work done which showed marked leukocytosis of 23,000 creatinine worsened from 1.6-3.12 CT chest abdomen pelvis was done which shows bilateral upper lobe infiltrates.  CT abdomen was largely unremarkable except for small abdominal aneurysm.  Patient was started on empiric antibiotics and admitted for further management of acute renal failure with diarrhea.  Stool for occult blood was positive but patient did not see any obvious bleeding.   Assessment & Plan:   Principal Problem:   Acute renal failure superimposed on stage 3 chronic kidney disease (HCC) Active Problems:   Chronic systolic CHF (congestive heart failure) (HCC)   CAD (coronary artery disease)   Lung nodule   Enteritis   ARF (acute renal failure) ([place])   1. Acute kidney injury on chronic kidney disease stage IIIb.  Secondary to volume depletion from diarrhea as well as ARB use.  Overall renal function is  improving with IV fluids.  Continue to hold ARB.  Monitor urine output and renal function. 2. Possible GI bleeding.  Patient reports frequent loose stools which are black in color.  Stools found to be positive for occult blood.  Hemoglobin has trended down from admission.  I suspect this will trend down further since he is likely hemoconcentrated.  He is not on any NSAIDs or anticoagulation.  Continue on PPI.  Will consult GI. 3. Diarrhea.  Possibly related to GI bleeding, although he feels that his symptoms may have been precipitated by recent antibiotic use for pneumonia.  Overall symptoms appear to have improved.  He had 1 loose stool today. C diff was negative on admission 4. Pneumonia.  Noted on CT imaging with bilateral upper lobe infiltrates.  Bilateral no evidence of really sure what to make of it because he says he has been having dark stools for the past week or so and he did test positive but currently on cefepime and vancomycin.  MRSA PCR has been ordered, if this is negative then vancomycin can be discontinued. 5. Coronary artery disease.  No complaints of chest pain.  Holding aspirin in light of #2. 6. History of CML.  On [place]. 7. Non anion gap metabolic acidosis. Likely related to diarrhea. Change fluids to bicarbonate infusion 8. Hyponatremia, likely related to hypovolemia. Improved with IV fluids   DVT prophylaxis: Place and maintain sequential compression device Start: 06/01/20 0600   Code Status: full code Family Communication: discussed with patient  Disposition Plan: Status is: Inpatient  Remains inpatient appropriate because:Ongoing diagnostic testing needed not appropriate for outpatient work up   Dispo: The patient is from: Home              Anticipated d/c is to: Home              Anticipated d/c date is: 1 day              Patient currently is not medically stable to d/c.   Consultants:     Procedures:     Antimicrobials:   Cefepime 7/12>  Vancomcyin  7/12>    Subjective: Had one dark colored stool today. No nausea or vomiting.   Objective: Vitals:   06/01/20 0041 06/01/20 0350 06/01/20 0559 06/01/20 0824  BP: (!) 144/73 (!) 117/50 (!) 121/47   Pulse: 69 63 (!) 58 62  Resp: 18 18    Temp: 98 F (36.7 C) 98.1 F (36.7 C) 98.1 F (36.7 C)   TempSrc: Oral Oral Oral   SpO2: 99% 98% 96%   Weight:      Height:        Intake/Output Summary (Last 24 hours) at 06/01/2020 1241 Last data filed at 06/01/2020 0900 Gross per 24 hour  Intake 547.17 ml  Output 600 ml  Net -52.83 ml   Filed Weights   05/31/20 1800 05/31/20 2350  Weight: 56.2 kg 53.2 kg    Examination:  General exam: Appears calm and comfortable  Respiratory system: Clear to auscultation. Respiratory effort normal. Cardiovascular system: S1 & S2 heard, RRR. No JVD, murmurs, rubs, gallops or clicks. No pedal edema. Gastrointestinal system: Abdomen is nondistended, soft and nontender. No organomegaly or masses felt. Normal bowel sounds heard. Central nervous system: Alert and oriented. No focal neurological deficits. Extremities: Symmetric 5 x 5 power. Skin: No rashes, lesions or ulcers Psychiatry: Judgement and insight appear normal. Mood & affect appropriate.     Data Reviewed: I have personally reviewed following labs and imaging studies  CBC: Recent Labs  Lab 05/31/20 1402 06/01/20 0609  WBC 23.8* 12.3*  NEUTROABS  --  9.1*  HGB 13.3 9.7*  HCT 43.0 31.5*  MCV 90.5 90.8  PLT 533* 062   Basic Metabolic Panel: Recent Labs  Lab 05/31/20 1402 06/01/20 0609  NA 130* 133*  K 4.9 4.4  CL 103 109  CO2 15* 14*  GLUCOSE 97 127*  BUN 36* 30*  CREATININE 3.12* 2.37*  CALCIUM 8.2* 7.4*  MG 2.6*  --    GFR: Estimated Creatinine Clearance: 16.5 mL/min (A) (by C-G formula based on SCr of 2.37 mg/dL (H)). Liver Function Tests: Recent Labs  Lab 05/31/20 1402 06/01/20 0609  AST 17 40  ALT 18 20  ALKPHOS 184* 272*  BILITOT 0.4 1.2  PROT 8.7* 6.1*   ALBUMIN 3.5 2.4*   Recent Labs  Lab 05/31/20 1402  LIPASE 18   No results for input(s): AMMONIA in the last 168 hours. Coagulation Profile: No results for input(s): INR, PROTIME in the last 168 hours. Cardiac Enzymes: No results for input(s): CKTOTAL, CKMB, CKMBINDEX, TROPONINI in the last 168 hours. BNP (last 3 results) No results for input(s): PROBNP in the last 8760 hours. HbA1C: No results for input(s): HGBA1C in the last 72 hours. CBG: No results for input(s): GLUCAP in the last 168 hours. Lipid Profile: No results for input(s): CHOL, HDL, LDLCALC, TRIG, CHOLHDL, LDLDIRECT in the last 72 hours. Thyroid Function Tests: No results for input(s): TSH, T4TOTAL,  FREET4, T3FREE, THYROIDAB in the last 72 hours. Anemia Panel: No results for input(s): VITAMINB12, FOLATE, FERRITIN, TIBC, IRON, RETICCTPCT in the last 72 hours. Sepsis Labs: Recent Labs  Lab 05/31/20 1617 06/01/20 0609  LATICACIDVEN 1.1 0.7    Recent Results (from the past 240 hour(s))  Blood culture (routine x 2)     Status: None (Preliminary result)   Collection Time: 05/31/20  1:40 PM   Specimen: BLOOD RIGHT FOREARM  Result Value Ref Range Status   Specimen Description   Final    BLOOD RIGHT FOREARM Performed at [facility], [address] [place], [place] [address]    Special Requests   Final    BOTTLES DRAWN AEROBIC ONLY Blood Culture results may not be optimal due to an inadequate volume of blood received in culture bottles Performed at [facility], [address] [place], [place] [address]    Culture   Final    NO GROWTH < 24 HOURS Performed at [facility], [place] [address] [place], [place] [address]    Report Status PENDING  Incomplete  C Difficile Quick Screen w PCR reflex     Status: None   Collection Time: 05/31/20  4:17 PM   Specimen: STOOL  Result Value Ref Range Status   C Diff antigen NEGATIVE NEGATIVE Final   C Diff toxin NEGATIVE NEGATIVE Final   C Diff  interpretation No C. difficile detected.  Final    Comment: Performed at [facility], [place] [address], [place], [place] [address]  SARS Coronavirus 2 by RT PCR (hospital order, performed in [facility] hospital lab) Nasopharyngeal     Status: None   Collection Time: 05/31/20  4:17 PM   Specimen: Nasopharyngeal  Result Value Ref Range Status   SARS Coronavirus 2 NEGATIVE NEGATIVE Final    Comment: (NOTE) SARS-CoV-2 target nucleic acids are NOT DETECTED.  The SARS-CoV-2 RNA is generally detectable in upper and lower respiratory specimens during the acute phase of infection. The lowest concentration of SARS-CoV-2 viral copies this assay can detect is 250 copies / mL. A negative result does not preclude SARS-CoV-2 infection and should not be used as the sole basis for treatment or other patient management decisions.  A negative result may occur with improper specimen collection / handling, submission of specimen other than nasopharyngeal swab, presence of viral mutation(s) within the areas targeted by this assay, and inadequate number of viral copies (<250 copies / mL). A negative result must be combined with clinical observations, patient history, and epidemiological information.  Fact Sheet for Patients:   StrictlyIdeas.no  Fact Sheet for Healthcare Providers: BankingDealers.co.za  This test is not yet approved or  cleared by the [place] FDA and has been authorized for detection and/or diagnosis of SARS-CoV-2 by FDA under an Emergency Use Authorization (EUA).  This EUA will remain in effect (meaning this test can be used) for the duration of the COVID-19 declaration under Section 564(b)(1) of the Act, 21 U.S.C. section 360bbb-3(b)(1), unless the authorization is terminated or revoked sooner.  Performed at [facility], [address] [place], [place] [address]   Blood culture (routine x 2)     Status: None  (Preliminary result)   Collection Time: 05/31/20  4:40 PM   Specimen: BLOOD LEFT FOREARM  Result Value Ref Range Status   Specimen Description   Final    BLOOD LEFT FOREARM Performed at [facility], [address] [place], [place] [address]    Special Requests   Final    BOTTLES DRAWN AEROBIC AND ANAEROBIC Blood Culture adequate volume Performed at [facility], [address] [place], [place] [address]    Culture   Final    NO GROWTH < 12 HOURS Performed at [place] [address] [place], [place] [address]    Report Status PENDING  Incomplete         Radiology Studies: CT ABDOMEN PELVIS WO CONTRAST  Result Date: 05/31/2020 CLINICAL DATA:  Pneumonia, nausea, diarrhea EXAM: CT CHEST, ABDOMEN AND PELVIS WITHOUT CONTRAST TECHNIQUE: Multidetector CT imaging of the chest, abdomen and pelvis was performed following the standard protocol without IV contrast. COMPARISON:  None. 07/06/2016 FINDINGS: CT CHEST FINDINGS Cardiovascular: There is extensive coronary artery calcification identified. Coronary artery stenting involving the proximal and mid left anterior descending coronary artery has been performed. Global cardiac size within normal limits. Moderate calcification of the mitral valve annulus. Mild calcification of the aortic valve leaflets. No pericardial effusion. Central pulmonary arteries are moderately enlarged in keeping with changes of pulmonary arterial hypertension. Atherosclerotic calcification is seen within the aortic arch and arch vasculature at their origins. No aneurysm. Mediastinum/Nodes: Shotty precarinal and aortopulmonary lymphadenopathy has progressed since prior examination, likely reactive in nature. No frankly pathologic adenopathy is identified within the thorax. Lungs/Pleura: There is severe centrilobular emphysema again identified. The lungs are symmetrically hyperinflated. Bullous change noted at the lung apices. There is  superimpose infiltrate noted within the upper lobes bilaterally, asymmetrically more severe within the left, most in keeping with superimposed acute infection. An 8 mm nodule has developed within the right upper lobe, on image 57/3, new since prior examination and indeterminate. 11 mm nodular lesion within the right upper lobe, axial image 50 is stable.3 mm nodule within the left lower lobe, image 47/3, is also stable and safely considered benign Musculoskeletal: No lytic or blastic bone lesions. CT ABDOMEN PELVIS FINDINGS Hepatobiliary: Cholecystectomy has been performed. Marked extrahepatic and moderate intrahepatic biliary ductal dilation may represent post cholecystectomy change, but appears progressive since prior examination. The liver is otherwise unremarkable. Pancreas: Unremarkable Spleen: Unremarkable Adrenals/Urinary Tract: The adrenal glands are unremarkable. The kidneys are normal in size and position. There is asymmetric mild left renal cortical atrophy. Multiple nonobstructing calculi are identified bilaterally measuring up to 7 mm within the lower pole of the left kidney. There is no hydronephrosis. No ureteral calculi. The bladder is unremarkable. Stomach/Bowel: Surgical changes of partial small bowel resection are identified. The small bowel demonstrates several proximal loops of fluid-filled marginally dilated loops without a discrete point of transition, a nonspecific finding that can be seen in the setting of infectious or inflammatory enteritis. There is, however, no evidence of obstruction. There is no free intraperitoneal gas or fluid. The distal colon and rectum demonstrate air-fluid levels, a finding that may present clinically as diarrhea. The stomach is unremarkable. Vascular/Lymphatic: There is extensive aortoiliac atherosclerotic calcification. 2.9 cm infrarenal abdominal aortic aneurysm is present. There is no pathologic adenopathy within the abdomen and pelvis. Reproductive:  Unremarkable Other: Diffuse body wall wasting noted. Musculoskeletal: Osseous structures are age-appropriate. No lytic or blastic bone lesions. IMPRESSION: Severe emphysema. Superimposed bilateral upper lobe pneumonic infiltrate, asymmetrically more severe within the left upper lobe. No central obstructing mass. New 8 mm pulmonary nodule within the right upper lobe. Non-contrast chest CT at 6-12 months is recommended. If the nodule is stable at time of repeat  CT, then future CT at 18-24 months (from today's scan) is considered optional for low-risk patients, but is recommended for high-risk patients. This recommendation follows the consensus statement: Guidelines for Management of Incidental Pulmonary Nodules Detected on CT Images: From the Fleischner Society 2017; Radiology 2017; 284:228-243. Bilateral nonobstructing nephrolithiasis. Coronary artery and peripheral vascular disease. 2.9 cm infrarenal abdominal aortic aneurysm. Aortic Atherosclerosis (ICD10-I70.0) and Emphysema (ICD10-J43.9). Electronically Signed   By: Fidela Salisbury MD   On: 05/31/2020 17:41   CT Chest Wo Contrast  Result Date: 05/31/2020 CLINICAL DATA:  Pneumonia, nausea, diarrhea EXAM: CT CHEST, ABDOMEN AND PELVIS WITHOUT CONTRAST TECHNIQUE: Multidetector CT imaging of the chest, abdomen and pelvis was performed following the standard protocol without IV contrast. COMPARISON:  None. 07/06/2016 FINDINGS: CT CHEST FINDINGS Cardiovascular: There is extensive coronary artery calcification identified. Coronary artery stenting involving the proximal and mid left anterior descending coronary artery has been performed. Global cardiac size within normal limits. Moderate calcification of the mitral valve annulus. Mild calcification of the aortic valve leaflets. No pericardial effusion. Central pulmonary arteries are moderately enlarged in keeping with changes of pulmonary arterial hypertension. Atherosclerotic calcification is seen within the aortic  arch and arch vasculature at their origins. No aneurysm. Mediastinum/Nodes: Shotty precarinal and aortopulmonary lymphadenopathy has progressed since prior examination, likely reactive in nature. No frankly pathologic adenopathy is identified within the thorax. Lungs/Pleura: There is severe centrilobular emphysema again identified. The lungs are symmetrically hyperinflated. Bullous change noted at the lung apices. There is superimpose infiltrate noted within the upper lobes bilaterally, asymmetrically more severe within the left, most in keeping with superimposed acute infection. An 8 mm nodule has developed within the right upper lobe, on image 57/3, new since prior examination and indeterminate. 11 mm nodular lesion within the right upper lobe, axial image 50 is stable.3 mm nodule within the left lower lobe, image 47/3, is also stable and safely considered benign Musculoskeletal: No lytic or blastic bone lesions. CT ABDOMEN PELVIS FINDINGS Hepatobiliary: Cholecystectomy has been performed. Marked extrahepatic and moderate intrahepatic biliary ductal dilation may represent post cholecystectomy change, but appears progressive since prior examination. The liver is otherwise unremarkable. Pancreas: Unremarkable Spleen: Unremarkable Adrenals/Urinary Tract: The adrenal glands are unremarkable. The kidneys are normal in size and position. There is asymmetric mild left renal cortical atrophy. Multiple nonobstructing calculi are identified bilaterally measuring up to 7 mm within the lower pole of the left kidney. There is no hydronephrosis. No ureteral calculi. The bladder is unremarkable. Stomach/Bowel: Surgical changes of partial small bowel resection are identified. The small bowel demonstrates several proximal loops of fluid-filled marginally dilated loops without a discrete point of transition, a nonspecific finding that can be seen in the setting of infectious or inflammatory enteritis. There is, however, no evidence  of obstruction. There is no free intraperitoneal gas or fluid. The distal colon and rectum demonstrate air-fluid levels, a finding that may present clinically as diarrhea. The stomach is unremarkable. Vascular/Lymphatic: There is extensive aortoiliac atherosclerotic calcification. 2.9 cm infrarenal abdominal aortic aneurysm is present. There is no pathologic adenopathy within the abdomen and pelvis. Reproductive: Unremarkable Other: Diffuse body wall wasting noted. Musculoskeletal: Osseous structures are age-appropriate. No lytic or blastic bone lesions. IMPRESSION: Severe emphysema. Superimposed bilateral upper lobe pneumonic infiltrate, asymmetrically more severe within the left upper lobe. No central obstructing mass. New 8 mm pulmonary nodule within the right upper lobe. Non-contrast chest CT at 6-12 months is recommended. If the nodule is stable at time of repeat CT, then future CT  at 18-24 months (from today's scan) is considered optional for low-risk patients, but is recommended for high-risk patients. This recommendation follows the consensus statement: Guidelines for Management of Incidental Pulmonary Nodules Detected on CT Images: From the Fleischner Society 2017; Radiology 2017; 284:228-243. Bilateral nonobstructing nephrolithiasis. Coronary artery and peripheral vascular disease. 2.9 cm infrarenal abdominal aortic aneurysm. Aortic Atherosclerosis (ICD10-I70.0) and Emphysema (ICD10-J43.9). Electronically Signed   By: Fidela Salisbury MD   On: 05/31/2020 17:41        Scheduled Meds: . aspirin EC  81 mg Oral Daily  . carvedilol  12.5 mg Oral BID WC  . ezetimibe  10 mg Oral Daily  . vancomycin variable dose per unstable renal function (pharmacist dosing)   Does not apply See admin instructions   Continuous Infusions: . ceFEPime (MAXIPIME) IV    . sodium bicarbonate 150 mEq in dextrose 5% 1000 mL 150 mEq (06/01/20 1148)     LOS: 0 days    Time spent: 41mins    Kathie Dike, MD [facility]   If 7PM-7AM, please contact night-coverage [email or web address]  06/01/2020, 12:41 PM

## 2020-06-01 NOTE — Consult Note (Signed)
Referring Provider:  [facility]         Primary Care Physician:  Drake Leach, MD Primary Gastroenterologist:  Tyro Cellar, MD            We were asked to see this patient for:  GI bleed             ASSESSMENT /  PLAN    Abdallah Hern is a 86 y.o. male PMH significant for, but not necessarily limited to,   CHF, ischemic cardiomyopathy , hyperlipidemia CKD, CML (in remission), CAD post stenting, pulmonary HTN, nephrolithiasis, diverticulitis, GERD, hiatal hernia, recurrent midgut volvulus with surgical repair x4 , small bowel and colonic diverticulosis, emphysema, CKD, cholecystectomy   # Nausea, vomiting, abdominal pain, acute on chronic diarrhea, fever / AKI / leukocytosis.  -- Non-contrast CT scan remarkable for several proximal loops of fluid-filled marginally dilated loops without transition point.  Air / fluid levels in distal rectum and colon.  --Could be infectious process given N/V/D and fever. Of note, the dilated small bowel on imaging maybe chronic ( based on prior imaging) and due to surgical anatomy . --Leukocytosis could be secondary to PNA vrs gastroenteritis. WBC improving- 24K >> 12.3.  --AKI improving. Cr 3.1 >> 2.37 --Nausea / vomiting / diarrhea and abdominal pain have resolved.    # Chronic diarrhea --Generally has responded to antibiotics for SIBO. At risk for SIBO given small bowel anatomy ( resections and small bowel diverticulosis) --Last course of antibiotics in early June, no recurrent symptoms since ( just one day of diarrhea now resolved)                  # Heme positive, dark stools / [place] Anemia --on daily baby asa --Started oral iron a week ago ( Oncology started), stools dark since.  --baseline hgb mid 11 range . Probably hemoconcentrated on admission with hgb of 13 --Hgb 9.7 after IV fluids.  --Patient very reluctant to have endoscopic evaluation. He is concerned about sedation. Will only proceed if absolutely necessary. He understands  that without looking we cannot know if / how much he is bleeding. Says he had a hard time waking up after sedation the last time ( in 2020 had vocal cord operation) --at this point will trend CBC today, PPI. If hgb continues to decline I think he will agree to endoscopic evaluation.                                                                                                                      # AKI on CKD ,improving --Cr 3.1 >> 2.37.   # Elevated alk phos --normal in 2019 --184 yesterday > 272 today --AST / ALT / Bilirubin normal.  --Elevation possibly from recent antibiotics ( ? Augmentin). However, in the body of the CT scan report there is mention of marked extrahepatic and moderate intrahepatic ductal dilation, progressed since last exam. He is post- cholecystectomy --May need MRCP when acute issues resolve.   #  PNA --on antibiotics.   # CML, in remission.  --followed by Dr. [name]   HPI:    Chief Complaint: none at present  Gilmer Kaminsky is a 86 y.o. male known to Dr. Havery Moros.  He was seen last in mid May of this year for chronic abdominal distention / bloating.  He also has chronic diarrhea, takes imodium. In the past diarrhea felt to be related to medication used for treatment of CML vrs SIBO.  He has declined testing for SIBO in the past but we have treated him empirically several times with Flagyl with transient resolution of symptoms.  He was not able to afford Xifaxan.  He has declined colonoscopies since friend had a bowel perforation associated with colonoscopy.  At the 04/06/20 visit, there was still high suspicion for SIBO.  We gave him samples of rifaximin.  Plan was for another CT scan if symptoms do not improve.  Again he declined endoscopic evaluation.  Xifaxan helped but response was durable. Patient called office 04/27/20 with recurrent symptoms. He was retreated for SIBO with flagyl x 2 weeks and got better. He still feels fine.   This admission:  Patient presented  to the ED yesterday morning with weakness.  Recently diagnosed with left-sided pneumonia.  He complained of intermittent fevers, nausea, diffuse lower abdominal pain and fever up to 101 degrees.  He hasn't had any abdominal pain in last several days and says only vomited once after gagging self with toothbrush. He did have recurrent diarrhea Sunday - Monday. Vomiting has resolved and no diarrhea yesterday afternoon or today.   In the ED his hemoglobin was above baseline at 13.3 ( probably hemoconcentrated.   His white count was nearly 24K .  Sodium 130.  He had AKI with creatinine of 3.1. Lipase 18. Alk phos 184. Non-contrast CT scan -The small bowel demonstrates several proximal loops of fluid-filled marginally dilated loops without a discrete point of transition, a nonspecific finding that can be seen in the setting of infectious or inflammatory enteritis  Patient started iron ~ one week ago. He started started having dark stools around the same time.   PREVIOUS ENDOSCOPIC EVALUATIONS / GI STUDIES :  Past Medical History:  Diagnosis Date  . Carotid artery occlusion   . Chronic systolic CHF (congestive heart failure) ([place])    a. 07/2013 Echo: EF 35-40%  . CKD (chronic kidney disease), stage III    pt. states that he does not have CKD  . CML (chronic myelocytic leukemia) ([place])   . Coronary artery disease    a. 09/2013 Cath: LM min irregs, LAD 95p(2.75x20 Promus Premier DES & 3.0x20 Promus Premier DES overlapping), D1 50-60, LCX 30p, OM1 39m OM2 60-70p, RCA small, nl.  . Diverticulitis   . Dysphonia 01/08/2017  . History of hiatal hernia   . History of kidney stones   . History of small bowel obstruction   . Hyperlipidemia    denies  . Ischemic cardiomyopathy    a. 07/2013 Echo: EF 35-40%, apical septal, apical lat, apical AK, mod MR.  . [place] bowel perforation ([place]    a. 11/2012 s/p emergent SB resection.  . 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    Past Surgical History:  Procedure Laterality  Date  . BOWEL RESECTION     3cm sm intestine  . CARDIAC CATHETERIZATION  10/09/2013  . CAROTID ENDARTERECTOMY    . CHOLECYSTECTOMY    . COLON SURGERY  11/2012   BOWEL SURGERY   . CORONARY ANGIOPLASTY  10/09/2013   MID LAD  . ENDARTERECTOMY Right 09/17/2015   Procedure: Right Carotid ENDARTERECTOMY with Xenosure Patch Angioplasty;  Surgeon: Serafina Mitchell, MD;  Location: [place] [facility];  Service: Vascular;  Laterality: Right;  . EYE SURGERY Bilateral   . INGUINAL HERNIA REPAIR Bilateral   . LAPAROSCOPIC CHOLECYSTECTOMY    . LAPAROTOMY N/A 10/14/2017   Procedure: EXPLORATORY LAPAROTOMY for SMALL BOWEL OBSTRUCTION, cecopexy;  Surgeon: Excell Seltzer, MD;  Location: [facility];  Service: General;  Laterality: N/A;  . LAPAROTOMY N/A 11/09/2017   Procedure: EXPLORATORY LAPAROTOMY reduction small bowel valvulous;  Surgeon: Rolm Bookbinder, MD;  Location: [facility];  Service: General;  Laterality: N/A;  . LAPAROTOMY N/A 01/10/2018   Procedure: exploratory laparotomy, lysis of adhesions, decompression of midgut volvulus;  Surgeon: Jovita Kussmaul, MD;  Location: [facility];  Service: General;  Laterality: N/A;  . LAPAROTOMY N/A 08/21/2018   Procedure: EXPLORATORY LAPAROTOMY;  Surgeon: Excell Seltzer, MD;  Location: [facility];  Service: General;  Laterality: N/A;  . LEFT HEART CATHETERIZATION WITH CORONARY ANGIOGRAM N/A 10/09/2013   Procedure: LEFT HEART CATHETERIZATION WITH CORONARY ANGIOGRAM;  Surgeon: Larey Dresser, MD;  Location: [facility] C[facility] LAB;  Service: Cardiovascular;  Laterality: N/A;  . PERCUTANEOUS CORONARY STENT INTERVENTION (PCI-S)  10/09/2013   Procedure: PERCUTANEOUS CORONARY STENT INTERVENTION (PCI-S);  Surgeon: Larey Dresser, MD;  Location: [facility] CATH LAB;  Service: Cardiovascular;;    Prior to Admission medications   Medication Sig Start Date End Date Taking? Authorizing Provider  Ascorbic Acid (VITAMIN C) 500 MG CAPS Take 500 mg by mouth daily.    Yes [provider]  aspirin EC 81 MG  tablet Take 1 tablet (81 mg total) by mouth daily. 10/02/13  Yes Larey Dresser, MD  BOSULIF 400 MG tablet Take 400 mg by mouth daily with breakfast.   Yes [provider]  carvedilol (COREG) 12.5 MG tablet TAKE ONE TABLET BY MOUTH TWICE A DAY Patient taking differently: Take 12.5 mg by mouth 2 (two) times daily with a meal.  05/21/20  Yes Larey Dresser, MD  cyanocobalamin 1000 MCG tablet Take 1,000 mcg by mouth daily.   Yes [provider]  diphenhydramine-acetaminophen (TYLENOL PM) 25-500 MG TABS tablet Take 2 tablets by mouth at bedtime.   Yes [provider]  ezetimibe (ZETIA) 10 MG tablet Take 1 tablet (10 mg total) by mouth daily. 04/09/20  Yes Larey Dresser, MD  famotidine (PEPCID) 20 MG tablet Take 20 mg by mouth as needed for heartburn or indigestion.    Yes [provider]  Ferrous Sulfate (IRON) 325 (65 Fe) MG TABS Take 325 mg by mouth daily.    Yes [provider]  furosemide (LASIX) 20 MG tablet Take 20 mg daily for 3 days as needed if  weight gain of 3 lbs in a week or 2 lbs in 24 hrs. Patient taking differently: Take 20 mg by mouth See admin instructions. Take 20 mg daily for 3 days as needed if  weight gain of 3 lbs in a week or 2 lbs in 24 hrs. 04/06/20  Yes Larey Dresser, MD  hydrocortisone cream 1 % Apply 1 application topically 2 (two) times daily as needed for itching.   Yes [provider]  loperamide (IMODIUM) 2 MG capsule Take 2 mg by mouth as needed for diarrhea or loose stools.    Yes [provider]  losartan (COZAAR) 25 MG tablet Take 0.5 tablets (12.5 mg total) by mouth daily.  03/15/20  Yes Larey Dresser, MD  Simethicone (GAS-X PO) Take 1 tablet by mouth daily as needed (gas).    Yes [provider]    Current Facility-Administered Medications  Medication Dose Route Frequency Provider Last Rate Last Admin  . aspirin EC tablet 81 mg  81 mg Oral Daily Rise Patience, MD   81 mg at  06/01/20 1016  . carvedilol (COREG) tablet 12.5 mg  12.5 mg Oral BID WC Rise Patience, MD   12.5 mg at 06/01/20 0824  . ceFEPIme (MAXIPIME) 2 g in sodium chloride 0.9 % 100 mL IVPB  2 g Intravenous Q24H Rise Patience, MD      . ezetimibe (ZETIA) tablet 10 mg  10 mg Oral Daily Rise Patience, MD   10 mg at 06/01/20 1016  . hydrALAZINE (APRESOLINE) injection 10 mg  10 mg Intravenous Q4H PRN Rise Patience, MD      . ondansetron [facility]) tablet 4 mg  4 mg Oral Q6H PRN Rise Patience, MD       Or  . ondansetron [facility]) injection 4 mg  4 mg Intravenous Q6H PRN Rise Patience, MD      . sodium bicarbonate 150 mEq in dextrose 5% 1000 mL infusion  150 mEq Intravenous Continuous Kathie Dike, MD 100 mL/hr at 06/01/20 1148 150 mEq at 06/01/20 1148  . vancomycin variable dose per unstable renal function (pharmacist dosing)   Does not apply See admin instructions Rise Patience, MD        Allergies as of 05/31/2020 - Review Complete 05/31/2020  Allergen Reaction Noted  . Percocet [oxycodone-acetaminophen] Itching 09/17/2015  . Metronidazole Other (See Comments) 09/16/2015    Family History  Problem Relation Age of Onset  . Emphysema Father        died @ 69- smoked  . Heart disease Father   . Kidney Stones Father   . Diabetes Mother        died @ 48  . Stroke Mother   . Head & neck cancer Brother   . Lymphoma Brother   . Colon cancer Neg Hx   . Stomach cancer Neg Hx   . Rectal cancer Neg Hx   . Esophageal cancer Neg Hx   . Liver cancer Neg Hx     Social History   Socioeconomic History  . Marital status: Married    Spouse name: Not on file  . Number of children: 4  . Years of education: Not on file  . Highest education level: Not on file  Occupational History  . Occupation: retired  Tobacco Use  . Smoking status: Former Smoker    Packs/day: 1.00    Years: 45.00    Pack years: 45.00    Types: Cigarettes    Quit date: 06/09/1999     Years since quitting: 20.9  . Smokeless tobacco: Never Used  Vaping Use  . Vaping Use: Never used  Substance and Sexual Activity  . Alcohol use: No    Alcohol/week: 0.0 standard drinks  . Drug use: No  . Sexual activity: Not on file  Other Topics Concern  . Not on file  Social History Narrative   Pt lives in [place] with his wife of 14 yrs (second wife).  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  Very active @ home.  Still push mows his yard.   Social Determinants of Health   Financial Resource Strain:   .  Difficulty of Paying Living Expenses:   Food Insecurity:   . Worried About Charity fundraiser in the Last Year:   . Arboriculturist in the Last Year:   Transportation Needs:   . Film/video editor (Medical):   Marland Kitchen Lack of Transportation (Non-Medical):   Physical Activity:   . Days of Exercise per Week:   . Minutes of Exercise per Session:   Stress:   . Feeling of Stress :   Social Connections:   . Frequency of Communication with Friends and Family:   . Frequency of Social Gatherings with Friends and Family:   . Attends Religious Services:   . Active Member of Clubs or Organizations:   . Attends Archivist Meetings:   Marland Kitchen Marital Status:   Intimate Partner Violence:   . Fear of Current or Ex-Partner:   . Emotionally Abused:   Marland Kitchen Physically Abused:   . Sexually Abused:     Review of Systems: All systems reviewed and negative except where noted in HPI.  Physical Exam: Vital signs in last 24 hours: Temp:  [98 F (36.7 C)-98.5 F (36.9 C)] 98.5 F (36.9 C) (07/13 1246) Pulse Rate:  [58-81] 68 (07/13 1246) Resp:  [11-25] 18 (07/13 1246) BP: (106-150)/(47-73) 106/50 (07/13 1246) SpO2:  [93 %-100 %] 95 % (07/13 1246) Weight:  [53.2 kg-56.2 kg] 53.2 kg (07/12 2350) Last BM Date: 05/31/20 General:   Alert, thin male in NAD Psych:  Pleasant, cooperative. Normal mood and affect. Eyes:  Pupils equal, sclera clear, no  icterus.   Conjunctiva pink. Ears:  Normal auditory acuity. Nose:  No deformity, discharge,  or lesions. Neck:  Supple; no masses Lungs:  Clear throughout to auscultation.   No wheezes, crackles, or rhonchi.  Heart:  Regular rate and rhythm; no murmurs, no lower extremity edema Abdomen:  Soft, non-distended, nontender, BS active, no palp mass   Rectal:  Deferred  Msk:  Symmetrical without gross deformities. . Neurologic:  Alert and  oriented x4;  grossly normal neurologically. Skin:  Intact without significant lesions or rashes.   Intake/Output from previous day: 07/12 0701 - 07/13 0700 In: 427.2 [I.V.:325.7; IV Piggyback:101.4] Out: 600 [Urine:600] Intake/Output this shift: Total I/O In: 120 [P.O.:120] Out: -   Lab Results: Recent Labs    05/31/20 1402 06/01/20 0609  WBC 23.8* 12.3*  HGB 13.3 9.7*  HCT 43.0 31.5*  PLT 533* 379   BMET Recent Labs    05/31/20 1402 06/01/20 0609  NA 130* 133*  K 4.9 4.4  CL 103 109  CO2 15* 14*  GLUCOSE 97 127*  BUN 36* 30*  CREATININE 3.12* 2.37*  CALCIUM 8.2* 7.4*   LFT Recent Labs    06/01/20 0609  PROT 6.1*  ALBUMIN 2.4*  AST 40  ALT 20  ALKPHOS 272*  BILITOT 1.2   PT/INR No results for input(s): LABPROT, INR in the last 72 hours. Hepatitis Panel No results for input(s): HEPBSAG, HCVAB, HEPAIGM, HEPBIGM in the last 72 hours.   . CBC Latest Ref Rng & Units 06/01/2020 05/31/2020 03/10/2020  WBC 4.0 - 10.5 K/uL 12.3(H) 23.8(H) 9.9  Hemoglobin 13.0 - 17.0 g/dL 9.7(L) 13.3 11.5(L)  Hematocrit 39 - 52 % 31.5(L) 43.0 37.4(L)  Platelets 150 - 400 K/uL 379 533(H) 219    . CMP Latest Ref Rng & Units 06/01/2020 05/31/2020 03/10/2020  Glucose 70 - 99 mg/dL 127(H) 97 95  BUN 8 - 23 mg/dL 30(H) 36(H) 20  Creatinine 0.61 -  1.24 mg/dL 2.37(H) 3.12(H) 1.60(H)  Sodium 135 - 145 mmol/L 133(L) 130(L) 140  Potassium 3.5 - 5.1 mmol/L 4.4 4.9 4.2  Chloride 98 - 111 mmol/L 109 103 108  CO2 22 - 32 mmol/L 14(L) 15(L) 21(L)  Calcium  8.9 - 10.3 mg/dL 7.4(L) 8.2(L) 7.5(L)  Total Protein 6.5 - 8.1 g/dL 6.1(L) 8.7(H) -  Total Bilirubin 0.3 - 1.2 mg/dL 1.2 0.4 -  Alkaline Phos 38 - 126 U/L 272(H) 184(H) -  AST 15 - 41 U/L 40 17 -  ALT 0 - 44 U/L 20 18 -   Studies/Results: CT ABDOMEN PELVIS WO CONTRAST  Result Date: 05/31/2020 CLINICAL DATA:  Pneumonia, nausea, diarrhea EXAM: CT CHEST, ABDOMEN AND PELVIS WITHOUT CONTRAST TECHNIQUE: Multidetector CT imaging of the chest, abdomen and pelvis was performed following the standard protocol without IV contrast. COMPARISON:  None. 07/06/2016 FINDINGS: CT CHEST FINDINGS Cardiovascular: There is extensive coronary artery calcification identified. Coronary artery stenting involving the proximal and mid left anterior descending coronary artery has been performed. Global cardiac size within normal limits. Moderate calcification of the mitral valve annulus. Mild calcification of the aortic valve leaflets. No pericardial effusion. Central pulmonary arteries are moderately enlarged in keeping with changes of pulmonary arterial hypertension. Atherosclerotic calcification is seen within the aortic arch and arch vasculature at their origins. No aneurysm. Mediastinum/Nodes: Shotty precarinal and aortopulmonary lymphadenopathy has progressed since prior examination, likely reactive in nature. No frankly pathologic adenopathy is identified within the thorax. Lungs/Pleura: There is severe centrilobular emphysema again identified. The lungs are symmetrically hyperinflated. Bullous change noted at the lung apices. There is superimpose infiltrate noted within the upper lobes bilaterally, asymmetrically more severe within the left, most in keeping with superimposed acute infection. An 8 mm nodule has developed within the right upper lobe, on image 57/3, new since prior examination and indeterminate. 11 mm nodular lesion within the right upper lobe, axial image 50 is stable.3 mm nodule within the left lower lobe,  image 47/3, is also stable and safely considered benign Musculoskeletal: No lytic or blastic bone lesions. CT ABDOMEN PELVIS FINDINGS Hepatobiliary: Cholecystectomy has been performed. Marked extrahepatic and moderate intrahepatic biliary ductal dilation may represent post cholecystectomy change, but appears progressive since prior examination. The liver is otherwise unremarkable. Pancreas: Unremarkable Spleen: Unremarkable Adrenals/Urinary Tract: The adrenal glands are unremarkable. The kidneys are normal in size and position. There is asymmetric mild left renal cortical atrophy. Multiple nonobstructing calculi are identified bilaterally measuring up to 7 mm within the lower pole of the left kidney. There is no hydronephrosis. No ureteral calculi. The bladder is unremarkable. Stomach/Bowel: Surgical changes of partial small bowel resection are identified. The small bowel demonstrates several proximal loops of fluid-filled marginally dilated loops without a discrete point of transition, a nonspecific finding that can be seen in the setting of infectious or inflammatory enteritis. There is, however, no evidence of obstruction. There is no free intraperitoneal gas or fluid. The distal colon and rectum demonstrate air-fluid levels, a finding that may present clinically as diarrhea. The stomach is unremarkable. Vascular/Lymphatic: There is extensive aortoiliac atherosclerotic calcification. 2.9 cm infrarenal abdominal aortic aneurysm is present. There is no pathologic adenopathy within the abdomen and pelvis. Reproductive: Unremarkable Other: Diffuse body wall wasting noted. Musculoskeletal: Osseous structures are age-appropriate. No lytic or blastic bone lesions. IMPRESSION: Severe emphysema. Superimposed bilateral upper lobe pneumonic infiltrate, asymmetrically more severe within the left upper lobe. No central obstructing mass. New 8 mm pulmonary nodule within the right upper lobe. Non-contrast chest CT  at 6-12  months is recommended. If the nodule is stable at time of repeat CT, then future CT at 18-24 months (from today's scan) is considered optional for low-risk patients, but is recommended for high-risk patients. This recommendation follows the consensus statement: Guidelines for Management of Incidental Pulmonary Nodules Detected on CT Images: From the Fleischner Society 2017; Radiology 2017; 284:228-243. Bilateral nonobstructing nephrolithiasis. Coronary artery and peripheral vascular disease. 2.9 cm infrarenal abdominal aortic aneurysm. Aortic Atherosclerosis (ICD10-I70.0) and Emphysema (ICD10-J43.9). Electronically Signed   By: Fidela Salisbury MD   On: 05/31/2020 17:41   CT Chest Wo Contrast  Result Date: 05/31/2020 CLINICAL DATA:  Pneumonia, nausea, diarrhea EXAM: CT CHEST, ABDOMEN AND PELVIS WITHOUT CONTRAST TECHNIQUE: Multidetector CT imaging of the chest, abdomen and pelvis was performed following the standard protocol without IV contrast. COMPARISON:  None. 07/06/2016 FINDINGS: CT CHEST FINDINGS Cardiovascular: There is extensive coronary artery calcification identified. Coronary artery stenting involving the proximal and mid left anterior descending coronary artery has been performed. Global cardiac size within normal limits. Moderate calcification of the mitral valve annulus. Mild calcification of the aortic valve leaflets. No pericardial effusion. Central pulmonary arteries are moderately enlarged in keeping with changes of pulmonary arterial hypertension. Atherosclerotic calcification is seen within the aortic arch and arch vasculature at their origins. No aneurysm. Mediastinum/Nodes: Shotty precarinal and aortopulmonary lymphadenopathy has progressed since prior examination, likely reactive in nature. No frankly pathologic adenopathy is identified within the thorax. Lungs/Pleura: There is severe centrilobular emphysema again identified. The lungs are symmetrically hyperinflated. Bullous change noted at  the lung apices. There is superimpose infiltrate noted within the upper lobes bilaterally, asymmetrically more severe within the left, most in keeping with superimposed acute infection. An 8 mm nodule has developed within the right upper lobe, on image 57/3, new since prior examination and indeterminate. 11 mm nodular lesion within the right upper lobe, axial image 50 is stable.3 mm nodule within the left lower lobe, image 47/3, is also stable and safely considered benign Musculoskeletal: No lytic or blastic bone lesions. CT ABDOMEN PELVIS FINDINGS Hepatobiliary: Cholecystectomy has been performed. Marked extrahepatic and moderate intrahepatic biliary ductal dilation may represent post cholecystectomy change, but appears progressive since prior examination. The liver is otherwise unremarkable. Pancreas: Unremarkable Spleen: Unremarkable Adrenals/Urinary Tract: The adrenal glands are unremarkable. The kidneys are normal in size and position. There is asymmetric mild left renal cortical atrophy. Multiple nonobstructing calculi are identified bilaterally measuring up to 7 mm within the lower pole of the left kidney. There is no hydronephrosis. No ureteral calculi. The bladder is unremarkable. Stomach/Bowel: Surgical changes of partial small bowel resection are identified. The small bowel demonstrates several proximal loops of fluid-filled marginally dilated loops without a discrete point of transition, a nonspecific finding that can be seen in the setting of infectious or inflammatory enteritis. There is, however, no evidence of obstruction. There is no free intraperitoneal gas or fluid. The distal colon and rectum demonstrate air-fluid levels, a finding that may present clinically as diarrhea. The stomach is unremarkable. Vascular/Lymphatic: There is extensive aortoiliac atherosclerotic calcification. 2.9 cm infrarenal abdominal aortic aneurysm is present. There is no pathologic adenopathy within the abdomen and  pelvis. Reproductive: Unremarkable Other: Diffuse body wall wasting noted. Musculoskeletal: Osseous structures are age-appropriate. No lytic or blastic bone lesions. IMPRESSION: Severe emphysema. Superimposed bilateral upper lobe pneumonic infiltrate, asymmetrically more severe within the left upper lobe. No central obstructing mass. New 8 mm pulmonary nodule within the right upper lobe. Non-contrast chest CT at 6-12 months  is recommended. If the nodule is stable at time of repeat CT, then future CT at 18-24 months (from today's scan) is considered optional for low-risk patients, but is recommended for high-risk patients. This recommendation follows the consensus statement: Guidelines for Management of Incidental Pulmonary Nodules Detected on CT Images: From the Fleischner Society 2017; Radiology 2017; 284:228-243. Bilateral nonobstructing nephrolithiasis. Coronary artery and peripheral vascular disease. 2.9 cm infrarenal abdominal aortic aneurysm. Aortic Atherosclerosis (ICD10-I70.0) and Emphysema (ICD10-J43.9). Electronically Signed   By: Fidela Salisbury MD   On: 05/31/2020 17:41    Principal Problem:   Acute renal failure superimposed on stage 3 chronic kidney disease ([place]) Active Problems:   Chronic systolic CHF (congestive heart failure) ([place])   CAD (coronary artery disease)   Lung nodule   Enteritis   ARF (acute renal failure) ([place])    Tye Savoy, NP-C @  06/01/2020, 12:48 PM

## 2020-06-02 DIAGNOSIS — R935 Abnormal findings on diagnostic imaging of other abdominal regions, including retroperitoneum: Secondary | ICD-10-CM

## 2020-06-02 DIAGNOSIS — R112 Nausea with vomiting, unspecified: Secondary | ICD-10-CM

## 2020-06-02 DIAGNOSIS — D509 Iron deficiency anemia, unspecified: Secondary | ICD-10-CM

## 2020-06-02 LAB — CBC
HCT: 26.1 % — ABNORMAL LOW (ref 39.0–52.0)
HCT: 26.3 % — ABNORMAL LOW (ref 39.0–52.0)
Hemoglobin: 8.4 g/dL — ABNORMAL LOW (ref 13.0–17.0)
Hemoglobin: 8.3 g/dL — ABNORMAL LOW (ref 13.0–17.0)
MCH: 28.4 pg (ref 26.0–34.0)
MCH: 28 pg (ref 26.0–34.0)
MCHC: 32.2 g/dL (ref 30.0–36.0)
MCHC: 31.6 g/dL (ref 30.0–36.0)
MCV: 88.2 fL (ref 80.0–100.0)
MCV: 88.9 fL (ref 80.0–100.0)
Platelets: 287 10*3/uL (ref 150–400)
Platelets: 275 10*3/uL (ref 150–400)
RBC: 2.96 MIL/uL — ABNORMAL LOW (ref 4.22–5.81)
RBC: 2.96 MIL/uL — ABNORMAL LOW (ref 4.22–5.81)
RDW: 16.3 % — ABNORMAL HIGH (ref 11.5–15.5)
RDW: 15.9 % — ABNORMAL HIGH (ref 11.5–15.5)
WBC: 12.1 10*3/uL — ABNORMAL HIGH (ref 4.0–10.5)
WBC: 12.2 10*3/uL — ABNORMAL HIGH (ref 4.0–10.5)
nRBC: 0 % (ref 0.0–0.2)
nRBC: 0 % (ref 0.0–0.2)

## 2020-06-02 NOTE — Progress Notes (Signed)
PROGRESS NOTE  Leib Elahi XFG:[id number] DOB: 06/18/1930 DOA: 05/31/2020 PCP: Drake Leach, MD  Brief History   RCV:ELFYB Mooreis a 86 y.o.malewithhistory of CAD status post stenting, CHF, CML, chronic kidney disease stage III who has 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 After 4 days patient again had to go to his primary care because symptoms did not improve and [name] was added to his amoxicillin. On "Sunday there is 48 hours ago patient had multiple episodes of diarrhea at least 10 episodes with some vomiting. Denies any abdominal pain. He took Imodium yesterday morning following which his diarrhea stopped but after about 8 hours later he started having diarrhea again. At this point he decided to come to the ER. Denies chest pain or shortness of breath. ED Course:In the ER patient had blood work done which showed marked leukocytosis of 23,000 creatinine worsened from 1.6-3.12 CT chest abdomen pelvis was done which shows bilateral upper lobe infiltrates. CT abdomen was largely unremarkable except for small abdominal aneurysm. Patient was started on empiric antibiotics and admitted for further management of acute renal failure with diarrhea. Stool for occult blood was positive but patient did not see any obvious bleeding. As patient's hemoglobin has dropped from 13.3 to 8.3, GI was consulted. They plan on doing EGD today.   Consultants   Gastroenterology  Procedures   EGD  Antibiotics   Anti-infectives (From admission, onward)   Start     Dose/Rate Route Frequency Ordered Stop   06/01/20 1830  ceFEPIme (MAXIPIME) 2 g in sodium chloride 0.9 % 100 mL IVPB     Discontinue     2 g 200 mL/hr over 30 Minutes Intravenous Every 24 hours 05/31/20 1831     05/31/20 1830   vancomycin variable dose per unstable renal function (pharmacist dosing)  Status:  Discontinued         Does not apply See admin instructions 05/31/20 1831 06/02/20 1348   05/31/20 1815  ceFEPIme (MAXIPIME) 2 g in sodium chloride 0.9 % 100 mL IVPB        2 g 200 mL/hr over 30 Minutes Intravenous  Once 05/31/20 1801 05/31/20 1919   05/31/20 1815  metroNIDAZOLE (FLAGYL) IVPB 500 mg        50" 0 mg 100 mL/hr over 60 Minutes Intravenous  Once 05/31/20 1801 05/31/20 2253   05/31/20 1815  vancomycin (VANCOCIN) IVPB 1000 mg/200 mL premix        1,000 mg 200 mL/hr over 60 Minutes Intravenous  Once 05/31/20 1809 06/01/20 0001       Subjective  The patient is resting comfortably. No new complaints. He has agreed to go forward with EGD.  Objective   Vitals:  Vitals:   06/01/20 2015 06/02/20 0500  BP: (!) 128/50 (!) 113/44  Pulse: 62 66  Resp:    Temp: 98 F (36.7 C) 98.1 F (36.7 C)  SpO2: 97% 96%   Exam:  Constitutional:   The patient is awake, alert, and oriented x 3. No acute distress. Respiratory:   No increased work of breathing.  No wheezes, rales, or rhonchi  No tactile fremitus Cardiovascular:   Regular rate and rhythm  No murmurs, ectopy, or gallups.  No lateral PMI. No thrills. Abdomen:   Abdomen is soft, non-tender, non-distended  No hernias,  masses, or organomegaly  Normoactive bowel sounds.  Musculoskeletal:   No cyanosis, clubbing, or edema Skin:   No rashes, lesions, ulcers  palpation of skin: no induration or nodules Neurologic:   CN 2-12 intact  Sensation all 4 extremities intact Psychiatric:   Mental status o Mood, affect appropriate o Orientation to person, place, time   judgment and insight appear intact  I have personally reviewed the following:   Today's Data   Vitals, CBC, CMP   Micro Data   Blood cultures x 2 - No growth  MRSA by PCR - negative  Scheduled Meds:  carvedilol  12.5 mg Oral BID WC   ezetimibe  10 mg  Oral Daily   pantoprazole  40 mg Oral BID AC   Continuous Infusions:  ceFEPime (MAXIPIME) IV 2 g (06/01/20 1737)   sodium bicarbonate 150 mEq in dextrose 5% 1000 mL 150 mEq (06/02/20 1110)    Principal Problem:   Acute renal failure superimposed on stage 3 chronic kidney disease ([facility]) Active Problems:   Chronic systolic CHF (congestive heart failure) ([facility])   CAD (coronary artery disease)   Lung nodule   Enteritis   ARF (acute renal failure) ([facility])   LOS: 1 day   A & P  Acute on chronic kidney disease stage III likely precipitated by patient's diarrhea and vomiting.  Patient also takes ARB which could have further worsen.  Will hold off ARB patient received fluids in the ER.  Gently hydrate.  Diarrhea and vomiting could be from recent use of antibiotics but patient also has history of multiple midgut volvulus CT does not show any signs of that now.  Patient also was recently treated for SIBO.  If symptoms does not improve may consult GI Dr. Havery Moros.  Anemia: Drop in hemoglobin from 13.3 on presentation on 05/31/2020 to 8.3 this morning. FOBT positive. Monitor hemoglobin and transfuse for hemoglobin less than 7.0.   GI bleed: Patient with abrupt drop in hemoglobin with positive FOBT. Plan is for EGD today.  Pneumonia presently on antibiotics.  CAD status post stenting denies chest pain.  On aspirin and Zetia.  History of CHF presently holding of ARB due to acute renal failure.  Hold of any diuretics.  History of CML on [place].  Since patient has multiple comorbidities with acute on chronic kidney disease will need close monitoring for any further worsening in inpatient status.   DVT prophylaxis: SCDs for now until you make sure there is no significant fall in hemoglobin. Code Status: Full code. Family Communication: Discussed with patient. Disposition Plan:  Status is: Inpatient  Remains inpatient appropriate because:Hemodynamically unstable and Inpatient level of  care appropriate due to severity of illness   Dispo: The patient is from: Home              Anticipated d/c is to: Home              Anticipated d/c date is: 2 days              Patient currently is not medically stable to d/c.   Kohler Pellerito, DO [facility] Direct contact: see [email or web address]  7PM-7AM contact night coverage as above 06/02/2020, 4:34 PM  LOS: 1 day

## 2020-06-02 NOTE — H&P (View-Only) (Signed)
Progress Note  CC:   Black stool       ASSESSMENT AND PLAN:    # Heme positive, dark stools / [place] Anemia --on daily baby asa --Started oral iron a week ago ( Oncology started), stools dark since.  --baseline hgb mid 11 range . Probably hemoconcentrated on admission with hgb of 13.  Hgb 9.7 after IV fluids but has continued to slowly decline and down to 8.4 today --Patient was reluctant yesterday to have endoscopic evaluation, concerned about sedation. He is agreeable today given decline in hgb. Last black BM was yesterday. The risks and benefits of EGD were discussed and the patient agrees to proceed. Keep NPO, will need to see if Endoscopy schedule can accommodate him today.   --Continue BID PPI  ADDENDUM: No availability in Endoscopy. Given diet, NPO after MN. Notified patient's RN.  Will plan for EGD tomorrow.   # Nausea, vomiting, abdominal pain, acute on chronic diarrhea, fever / AKI / leukocytosis.  -- Non-contrast CT scan remarkable for several proximal loops of fluid-filled marginally dilated loops without transition point.  Air / fluid levels in distal rectum and colon.  --Could be infectious process given N/V/D and fever. Of note, the dilated small bowel on imaging maybe chronic ( based on prior imaging) and due to surgical anatomy . --Leukocytosis could be secondary to PNA vrs gastroenteritis. WBC improving- 24K >> 12.3 >> 12.1.  --AKI improving --Nausea / vomiting / diarrhea and abdominal pain have resolved.    # Chronic diarrhea --Generally has responded to antibiotics for SIBO. At risk for SIBO given small bowel anatomy ( resections and small bowel diverticulosis) --Last course of antibiotics in early June, no recurrent symptoms since ( just one day of diarrhea now resolved)                                                                                                                                     # AKI on CKD --Improving as of yesterday, Cr 3.1 >> 2.37.    # Elevated alk phos --normal in 2019 --184 yesterday > 272  --AST / ALT / Bilirubin normal.  --Elevation possibly from recent antibiotics ( ? Augmentin). However, in the body of the CT scan report there is mention of marked extrahepatic and moderate intrahepatic ductal dilation, progressed since last exam. He is post- cholecystectomy --May need MRCP when acute issues resolve.   # PNA --on antibiotics.   # CML, in remission.  --followed by Dr. [name]    SUBJECTIVE   Last black BM was yesterday. Feels okay.   OBJECTIVE:     Vital signs in last 24 hours: Temp:  [98 F (36.7 C)-98.5 F (36.9 C)] 98.1 F (36.7 C) (07/14 0500) Pulse Rate:  [62-68] 66 (07/14 0500) Resp:  [18] 18 (07/13 1246) BP: (106-128)/(44-58) 113/44 (07/14 0500) SpO2:  [95 %-97 %] 96 % (07/14 0500) Last BM Date:  05/31/20 General:   Alert, in NAD Heart:  Regular rate and rhythm.  No lower extremity edema   Pulm: Normal respiratory effort. Crackles in chest but not posteriorally   Abdomen:  Soft,  nontender, nondistended.  Normal bowel sounds.          Neurologic:  Alert and  oriented,  grossly normal neurologically. Psych:  Pleasant, cooperative.  Normal mood and affect.   Intake/Output from previous day: 07/13 0701 - 07/14 0700 In: 1586.7 [P.O.:360; I.V.:1226.7] Out: 1000 [Urine:1000] Intake/Output this shift: No intake/output data recorded.  Lab Results: Recent Labs    06/01/20 1521 06/01/20 2238 06/02/20 0625  WBC 11.1* 12.4* 12.1*  HGB 8.9* 8.8* 8.4*  HCT 27.2* 26.9* 26.1*  PLT 340 334 287   BMET Recent Labs    05/31/20 1402 06/01/20 0609  NA 130* 133*  K 4.9 4.4  CL 103 109  CO2 15* 14*  GLUCOSE 97 127*  BUN 36* 30*  CREATININE 3.12* 2.37*  CALCIUM 8.2* 7.4*   LFT Recent Labs    06/01/20 0609  PROT 6.1*  ALBUMIN 2.4*  AST 40  ALT 20  ALKPHOS 272*  BILITOT 1.2   PT/INR No results for input(s): LABPROT, INR in the last 72 hours. Hepatitis Panel No results for  input(s): HEPBSAG, HCVAB, HEPAIGM, HEPBIGM in the last 72 hours.  CT ABDOMEN PELVIS WO CONTRAST  Result Date: 05/31/2020 CLINICAL DATA:  Pneumonia, nausea, diarrhea EXAM: CT CHEST, ABDOMEN AND PELVIS WITHOUT CONTRAST TECHNIQUE: Multidetector CT imaging of the chest, abdomen and pelvis was performed following the standard protocol without IV contrast. COMPARISON:  None. 07/06/2016 FINDINGS: CT CHEST FINDINGS Cardiovascular: There is extensive coronary artery calcification identified. Coronary artery stenting involving the proximal and mid left anterior descending coronary artery has been performed. Global cardiac size within normal limits. Moderate calcification of the mitral valve annulus. Mild calcification of the aortic valve leaflets. No pericardial effusion. Central pulmonary arteries are moderately enlarged in keeping with changes of pulmonary arterial hypertension. Atherosclerotic calcification is seen within the aortic arch and arch vasculature at their origins. No aneurysm. Mediastinum/Nodes: Shotty precarinal and aortopulmonary lymphadenopathy has progressed since prior examination, likely reactive in nature. No frankly pathologic adenopathy is identified within the thorax. Lungs/Pleura: There is severe centrilobular emphysema again identified. The lungs are symmetrically hyperinflated. Bullous change noted at the lung apices. There is superimpose infiltrate noted within the upper lobes bilaterally, asymmetrically more severe within the left, most in keeping with superimposed acute infection. An 8 mm nodule has developed within the right upper lobe, on image 57/3, new since prior examination and indeterminate. 11 mm nodular lesion within the right upper lobe, axial image 50 is stable.3 mm nodule within the left lower lobe, image 47/3, is also stable and safely considered benign Musculoskeletal: No lytic or blastic bone lesions. CT ABDOMEN PELVIS FINDINGS Hepatobiliary: Cholecystectomy has been  performed. Marked extrahepatic and moderate intrahepatic biliary ductal dilation may represent post cholecystectomy change, but appears progressive since prior examination. The liver is otherwise unremarkable. Pancreas: Unremarkable Spleen: Unremarkable Adrenals/Urinary Tract: The adrenal glands are unremarkable. The kidneys are normal in size and position. There is asymmetric mild left renal cortical atrophy. Multiple nonobstructing calculi are identified bilaterally measuring up to 7 mm within the lower pole of the left kidney. There is no hydronephrosis. No ureteral calculi. The bladder is unremarkable. Stomach/Bowel: Surgical changes of partial small bowel resection are identified. The small bowel demonstrates several proximal loops of fluid-filled marginally dilated loops without a discrete point  of transition, a nonspecific finding that can be seen in the setting of infectious or inflammatory enteritis. There is, however, no evidence of obstruction. There is no free intraperitoneal gas or fluid. The distal colon and rectum demonstrate air-fluid levels, a finding that may present clinically as diarrhea. The stomach is unremarkable. Vascular/Lymphatic: There is extensive aortoiliac atherosclerotic calcification. 2.9 cm infrarenal abdominal aortic aneurysm is present. There is no pathologic adenopathy within the abdomen and pelvis. Reproductive: Unremarkable Other: Diffuse body wall wasting noted. Musculoskeletal: Osseous structures are age-appropriate. No lytic or blastic bone lesions. IMPRESSION: Severe emphysema. Superimposed bilateral upper lobe pneumonic infiltrate, asymmetrically more severe within the left upper lobe. No central obstructing mass. New 8 mm pulmonary nodule within the right upper lobe. Non-contrast chest CT at 6-12 months is recommended. If the nodule is stable at time of repeat CT, then future CT at 18-24 months (from today's scan) is considered optional for low-risk patients, but is  recommended for high-risk patients. This recommendation follows the consensus statement: Guidelines for Management of Incidental Pulmonary Nodules Detected on CT Images: From the Fleischner Society 2017; Radiology 2017; 284:228-243. Bilateral nonobstructing nephrolithiasis. Coronary artery and peripheral vascular disease. 2.9 cm infrarenal abdominal aortic aneurysm. Aortic Atherosclerosis (ICD10-I70.0) and Emphysema (ICD10-J43.9). Electronically Signed   By: Fidela Salisbury MD   On: 05/31/2020 17:41   CT Chest Wo Contrast  Result Date: 05/31/2020 CLINICAL DATA:  Pneumonia, nausea, diarrhea EXAM: CT CHEST, ABDOMEN AND PELVIS WITHOUT CONTRAST TECHNIQUE: Multidetector CT imaging of the chest, abdomen and pelvis was performed following the standard protocol without IV contrast. COMPARISON:  None. 07/06/2016 FINDINGS: CT CHEST FINDINGS Cardiovascular: There is extensive coronary artery calcification identified. Coronary artery stenting involving the proximal and mid left anterior descending coronary artery has been performed. Global cardiac size within normal limits. Moderate calcification of the mitral valve annulus. Mild calcification of the aortic valve leaflets. No pericardial effusion. Central pulmonary arteries are moderately enlarged in keeping with changes of pulmonary arterial hypertension. Atherosclerotic calcification is seen within the aortic arch and arch vasculature at their origins. No aneurysm. Mediastinum/Nodes: Shotty precarinal and aortopulmonary lymphadenopathy has progressed since prior examination, likely reactive in nature. No frankly pathologic adenopathy is identified within the thorax. Lungs/Pleura: There is severe centrilobular emphysema again identified. The lungs are symmetrically hyperinflated. Bullous change noted at the lung apices. There is superimpose infiltrate noted within the upper lobes bilaterally, asymmetrically more severe within the left, most in keeping with superimposed  acute infection. An 8 mm nodule has developed within the right upper lobe, on image 57/3, new since prior examination and indeterminate. 11 mm nodular lesion within the right upper lobe, axial image 50 is stable.3 mm nodule within the left lower lobe, image 47/3, is also stable and safely considered benign Musculoskeletal: No lytic or blastic bone lesions. CT ABDOMEN PELVIS FINDINGS Hepatobiliary: Cholecystectomy has been performed. Marked extrahepatic and moderate intrahepatic biliary ductal dilation may represent post cholecystectomy change, but appears progressive since prior examination. The liver is otherwise unremarkable. Pancreas: Unremarkable Spleen: Unremarkable Adrenals/Urinary Tract: The adrenal glands are unremarkable. The kidneys are normal in size and position. There is asymmetric mild left renal cortical atrophy. Multiple nonobstructing calculi are identified bilaterally measuring up to 7 mm within the lower pole of the left kidney. There is no hydronephrosis. No ureteral calculi. The bladder is unremarkable. Stomach/Bowel: Surgical changes of partial small bowel resection are identified. The small bowel demonstrates several proximal loops of fluid-filled marginally dilated loops without a discrete point of transition, a nonspecific  finding that can be seen in the setting of infectious or inflammatory enteritis. There is, however, no evidence of obstruction. There is no free intraperitoneal gas or fluid. The distal colon and rectum demonstrate air-fluid levels, a finding that may present clinically as diarrhea. The stomach is unremarkable. Vascular/Lymphatic: There is extensive aortoiliac atherosclerotic calcification. 2.9 cm infrarenal abdominal aortic aneurysm is present. There is no pathologic adenopathy within the abdomen and pelvis. Reproductive: Unremarkable Other: Diffuse body wall wasting noted. Musculoskeletal: Osseous structures are age-appropriate. No lytic or blastic bone lesions.  IMPRESSION: Severe emphysema. Superimposed bilateral upper lobe pneumonic infiltrate, asymmetrically more severe within the left upper lobe. No central obstructing mass. New 8 mm pulmonary nodule within the right upper lobe. Non-contrast chest CT at 6-12 months is recommended. If the nodule is stable at time of repeat CT, then future CT at 18-24 months (from today's scan) is considered optional for low-risk patients, but is recommended for high-risk patients. This recommendation follows the consensus statement: Guidelines for Management of Incidental Pulmonary Nodules Detected on CT Images: From the Fleischner Society 2017; Radiology 2017; 284:228-243. Bilateral nonobstructing nephrolithiasis. Coronary artery and peripheral vascular disease. 2.9 cm infrarenal abdominal aortic aneurysm. Aortic Atherosclerosis (ICD10-I70.0) and Emphysema (ICD10-J43.9). Electronically Signed   By: Fidela Salisbury MD   On: 05/31/2020 17:41      Principal Problem:   Acute renal failure superimposed on stage 3 chronic kidney disease ([place]) Active Problems:   Chronic systolic CHF (congestive heart failure) ([facility])   CAD (coronary artery disease)   Lung nodule   Enteritis   ARF (acute renal failure) ([place])     LOS: 1 day   [name] [name] ,NP 06/02/2020, 9:24 AM

## 2020-06-02 NOTE — Progress Notes (Addendum)
Progress Note  CC:   Black stool       ASSESSMENT AND PLAN:    # Heme positive, dark stools / [place] Anemia --on daily baby asa --Started oral iron a week ago ( Oncology started), stools dark since.  --baseline hgb mid 11 range . Probably hemoconcentrated on admission with hgb of 13.  Hgb 9.7 after IV fluids but has continued to slowly decline and down to 8.4 today --Patient was reluctant yesterday to have endoscopic evaluation, concerned about sedation. He is agreeable today given decline in hgb. Last black BM was yesterday. The risks and benefits of EGD were discussed and the patient agrees to proceed. Keep NPO, will need to see if Endoscopy schedule can accommodate him today.   --Continue BID PPI  ADDENDUM: No availability in Endoscopy. Given diet, NPO after MN. Notified patient's RN.  Will plan for EGD tomorrow.   # Nausea, vomiting, abdominal pain, acute on chronic diarrhea, fever / AKI / leukocytosis.  -- Non-contrast CT scan remarkable for several proximal loops of fluid-filled marginally dilated loops without transition point.  Air / fluid levels in distal rectum and colon.  --Could be infectious process given N/V/D and fever. Of note, the dilated small bowel on imaging maybe chronic ( based on prior imaging) and due to surgical anatomy . --Leukocytosis could be secondary to PNA vrs gastroenteritis. WBC improving- 24K >> 12.3 >> 12.1.  --AKI improving --Nausea / vomiting / diarrhea and abdominal pain have resolved.    # Chronic diarrhea --Generally has responded to antibiotics for SIBO. At risk for SIBO given small bowel anatomy ( resections and small bowel diverticulosis) --Last course of antibiotics in early June, no recurrent symptoms since ( just one day of diarrhea now resolved)                                                                                                                                     # AKI on CKD --Improving as of yesterday, Cr 3.1 >> 2.37.    # Elevated alk phos --normal in 2019 --184 yesterday > 272  --AST / ALT / Bilirubin normal.  --Elevation possibly from recent antibiotics ( ? Augmentin). However, in the body of the CT scan report there is mention of marked extrahepatic and moderate intrahepatic ductal dilation, progressed since last exam. He is post- cholecystectomy --May need MRCP when acute issues resolve.   # PNA --on antibiotics.   # CML, in remission.  --followed by Dr. [name]    SUBJECTIVE   Last black BM was yesterday. Feels okay.   OBJECTIVE:     Vital signs in last 24 hours: Temp:  [98 F (36.7 C)-98.5 F (36.9 C)] 98.1 F (36.7 C) (07/14 0500) Pulse Rate:  [62-68] 66 (07/14 0500) Resp:  [18] 18 (07/13 1246) BP: (106-128)/(44-58) 113/44 (07/14 0500) SpO2:  [95 %-97 %] 96 % (07/14 0500) Last BM Date:  05/31/20 General:   Alert, in NAD Heart:  Regular rate and rhythm.  No lower extremity edema   Pulm: Normal respiratory effort. Crackles in chest but not posteriorally   Abdomen:  Soft,  nontender, nondistended.  Normal bowel sounds.          Neurologic:  Alert and  oriented,  grossly normal neurologically. Psych:  Pleasant, cooperative.  Normal mood and affect.   Intake/Output from previous day: 07/13 0701 - 07/14 0700 In: 1586.7 [P.O.:360; I.V.:1226.7] Out: 1000 [Urine:1000] Intake/Output this shift: No intake/output data recorded.  Lab Results: Recent Labs    06/01/20 1521 06/01/20 2238 06/02/20 0625  WBC 11.1* 12.4* 12.1*  HGB 8.9* 8.8* 8.4*  HCT 27.2* 26.9* 26.1*  PLT 340 334 287   BMET Recent Labs    05/31/20 1402 06/01/20 0609  NA 130* 133*  K 4.9 4.4  CL 103 109  CO2 15* 14*  GLUCOSE 97 127*  BUN 36* 30*  CREATININE 3.12* 2.37*  CALCIUM 8.2* 7.4*   LFT Recent Labs    06/01/20 0609  PROT 6.1*  ALBUMIN 2.4*  AST 40  ALT 20  ALKPHOS 272*  BILITOT 1.2   PT/INR No results for input(s): LABPROT, INR in the last 72 hours. Hepatitis Panel No results for  input(s): HEPBSAG, HCVAB, HEPAIGM, HEPBIGM in the last 72 hours.  CT ABDOMEN PELVIS WO CONTRAST  Result Date: 05/31/2020 CLINICAL DATA:  Pneumonia, nausea, diarrhea EXAM: CT CHEST, ABDOMEN AND PELVIS WITHOUT CONTRAST TECHNIQUE: Multidetector CT imaging of the chest, abdomen and pelvis was performed following the standard protocol without IV contrast. COMPARISON:  None. 07/06/2016 FINDINGS: CT CHEST FINDINGS Cardiovascular: There is extensive coronary artery calcification identified. Coronary artery stenting involving the proximal and mid left anterior descending coronary artery has been performed. Global cardiac size within normal limits. Moderate calcification of the mitral valve annulus. Mild calcification of the aortic valve leaflets. No pericardial effusion. Central pulmonary arteries are moderately enlarged in keeping with changes of pulmonary arterial hypertension. Atherosclerotic calcification is seen within the aortic arch and arch vasculature at their origins. No aneurysm. Mediastinum/Nodes: Shotty precarinal and aortopulmonary lymphadenopathy has progressed since prior examination, likely reactive in nature. No frankly pathologic adenopathy is identified within the thorax. Lungs/Pleura: There is severe centrilobular emphysema again identified. The lungs are symmetrically hyperinflated. Bullous change noted at the lung apices. There is superimpose infiltrate noted within the upper lobes bilaterally, asymmetrically more severe within the left, most in keeping with superimposed acute infection. An 8 mm nodule has developed within the right upper lobe, on image 57/3, new since prior examination and indeterminate. 11 mm nodular lesion within the right upper lobe, axial image 50 is stable.3 mm nodule within the left lower lobe, image 47/3, is also stable and safely considered benign Musculoskeletal: No lytic or blastic bone lesions. CT ABDOMEN PELVIS FINDINGS Hepatobiliary: Cholecystectomy has been  performed. Marked extrahepatic and moderate intrahepatic biliary ductal dilation may represent post cholecystectomy change, but appears progressive since prior examination. The liver is otherwise unremarkable. Pancreas: Unremarkable Spleen: Unremarkable Adrenals/Urinary Tract: The adrenal glands are unremarkable. The kidneys are normal in size and position. There is asymmetric mild left renal cortical atrophy. Multiple nonobstructing calculi are identified bilaterally measuring up to 7 mm within the lower pole of the left kidney. There is no hydronephrosis. No ureteral calculi. The bladder is unremarkable. Stomach/Bowel: Surgical changes of partial small bowel resection are identified. The small bowel demonstrates several proximal loops of fluid-filled marginally dilated loops without a discrete point  of transition, a nonspecific finding that can be seen in the setting of infectious or inflammatory enteritis. There is, however, no evidence of obstruction. There is no free intraperitoneal gas or fluid. The distal colon and rectum demonstrate air-fluid levels, a finding that may present clinically as diarrhea. The stomach is unremarkable. Vascular/Lymphatic: There is extensive aortoiliac atherosclerotic calcification. 2.9 cm infrarenal abdominal aortic aneurysm is present. There is no pathologic adenopathy within the abdomen and pelvis. Reproductive: Unremarkable Other: Diffuse body wall wasting noted. Musculoskeletal: Osseous structures are age-appropriate. No lytic or blastic bone lesions. IMPRESSION: Severe emphysema. Superimposed bilateral upper lobe pneumonic infiltrate, asymmetrically more severe within the left upper lobe. No central obstructing mass. New 8 mm pulmonary nodule within the right upper lobe. Non-contrast chest CT at 6-12 months is recommended. If the nodule is stable at time of repeat CT, then future CT at 18-24 months (from today's scan) is considered optional for low-risk patients, but is  recommended for high-risk patients. This recommendation follows the consensus statement: Guidelines for Management of Incidental Pulmonary Nodules Detected on CT Images: From the Fleischner Society 2017; Radiology 2017; 284:228-243. Bilateral nonobstructing nephrolithiasis. Coronary artery and peripheral vascular disease. 2.9 cm infrarenal abdominal aortic aneurysm. Aortic Atherosclerosis (ICD10-I70.0) and Emphysema (ICD10-J43.9). Electronically Signed   By: Fidela Salisbury MD   On: 05/31/2020 17:41   CT Chest Wo Contrast  Result Date: 05/31/2020 CLINICAL DATA:  Pneumonia, nausea, diarrhea EXAM: CT CHEST, ABDOMEN AND PELVIS WITHOUT CONTRAST TECHNIQUE: Multidetector CT imaging of the chest, abdomen and pelvis was performed following the standard protocol without IV contrast. COMPARISON:  None. 07/06/2016 FINDINGS: CT CHEST FINDINGS Cardiovascular: There is extensive coronary artery calcification identified. Coronary artery stenting involving the proximal and mid left anterior descending coronary artery has been performed. Global cardiac size within normal limits. Moderate calcification of the mitral valve annulus. Mild calcification of the aortic valve leaflets. No pericardial effusion. Central pulmonary arteries are moderately enlarged in keeping with changes of pulmonary arterial hypertension. Atherosclerotic calcification is seen within the aortic arch and arch vasculature at their origins. No aneurysm. Mediastinum/Nodes: Shotty precarinal and aortopulmonary lymphadenopathy has progressed since prior examination, likely reactive in nature. No frankly pathologic adenopathy is identified within the thorax. Lungs/Pleura: There is severe centrilobular emphysema again identified. The lungs are symmetrically hyperinflated. Bullous change noted at the lung apices. There is superimpose infiltrate noted within the upper lobes bilaterally, asymmetrically more severe within the left, most in keeping with superimposed  acute infection. An 8 mm nodule has developed within the right upper lobe, on image 57/3, new since prior examination and indeterminate. 11 mm nodular lesion within the right upper lobe, axial image 50 is stable.3 mm nodule within the left lower lobe, image 47/3, is also stable and safely considered benign Musculoskeletal: No lytic or blastic bone lesions. CT ABDOMEN PELVIS FINDINGS Hepatobiliary: Cholecystectomy has been performed. Marked extrahepatic and moderate intrahepatic biliary ductal dilation may represent post cholecystectomy change, but appears progressive since prior examination. The liver is otherwise unremarkable. Pancreas: Unremarkable Spleen: Unremarkable Adrenals/Urinary Tract: The adrenal glands are unremarkable. The kidneys are normal in size and position. There is asymmetric mild left renal cortical atrophy. Multiple nonobstructing calculi are identified bilaterally measuring up to 7 mm within the lower pole of the left kidney. There is no hydronephrosis. No ureteral calculi. The bladder is unremarkable. Stomach/Bowel: Surgical changes of partial small bowel resection are identified. The small bowel demonstrates several proximal loops of fluid-filled marginally dilated loops without a discrete point of transition, a nonspecific  finding that can be seen in the setting of infectious or inflammatory enteritis. There is, however, no evidence of obstruction. There is no free intraperitoneal gas or fluid. The distal colon and rectum demonstrate air-fluid levels, a finding that may present clinically as diarrhea. The stomach is unremarkable. Vascular/Lymphatic: There is extensive aortoiliac atherosclerotic calcification. 2.9 cm infrarenal abdominal aortic aneurysm is present. There is no pathologic adenopathy within the abdomen and pelvis. Reproductive: Unremarkable Other: Diffuse body wall wasting noted. Musculoskeletal: Osseous structures are age-appropriate. No lytic or blastic bone lesions.  IMPRESSION: Severe emphysema. Superimposed bilateral upper lobe pneumonic infiltrate, asymmetrically more severe within the left upper lobe. No central obstructing mass. New 8 mm pulmonary nodule within the right upper lobe. Non-contrast chest CT at 6-12 months is recommended. If the nodule is stable at time of repeat CT, then future CT at 18-24 months (from today's scan) is considered optional for low-risk patients, but is recommended for high-risk patients. This recommendation follows the consensus statement: Guidelines for Management of Incidental Pulmonary Nodules Detected on CT Images: From the Fleischner Society 2017; Radiology 2017; 284:228-243. Bilateral nonobstructing nephrolithiasis. Coronary artery and peripheral vascular disease. 2.9 cm infrarenal abdominal aortic aneurysm. Aortic Atherosclerosis (ICD10-I70.0) and Emphysema (ICD10-J43.9). Electronically Signed   By: Fidela Salisbury MD   On: 05/31/2020 17:41      Principal Problem:   Acute renal failure superimposed on stage 3 chronic kidney disease ([place]) Active Problems:   Chronic systolic CHF (congestive heart failure) ([facility])   CAD (coronary artery disease)   Lung nodule   Enteritis   ARF (acute renal failure) ([place])     LOS: 1 day   Tye Savoy ,NP 06/02/2020, 9:24 AM

## 2020-06-03 ENCOUNTER — Inpatient Hospital Stay (HOSPITAL_COMMUNITY): Payer: Medicare HMO | Admitting: Anesthesiology

## 2020-06-03 ENCOUNTER — Encounter (HOSPITAL_COMMUNITY): Payer: Self-pay | Admitting: Internal Medicine

## 2020-06-03 ENCOUNTER — Inpatient Hospital Stay (HOSPITAL_COMMUNITY): Payer: Medicare HMO

## 2020-06-03 ENCOUNTER — Encounter (HOSPITAL_COMMUNITY)
Admission: EM | Disposition: A | Discharge status: Home Health | Payer: Self-pay | Source: Home / Self Care | Attending: Internal Medicine

## 2020-06-03 DIAGNOSIS — R911 Solitary pulmonary nodule: Secondary | ICD-10-CM

## 2020-06-03 DIAGNOSIS — R112 Nausea with vomiting, unspecified: Secondary | ICD-10-CM | POA: Diagnosis not present

## 2020-06-03 DIAGNOSIS — K228 Other specified diseases of esophagus: Secondary | ICD-10-CM

## 2020-06-03 DIAGNOSIS — K269 Duodenal ulcer, unspecified as acute or chronic, without hemorrhage or perforation: Secondary | ICD-10-CM

## 2020-06-03 DIAGNOSIS — K22719 Barrett's esophagus with dysplasia, unspecified: Secondary | ICD-10-CM

## 2020-06-03 DIAGNOSIS — K253 Acute gastric ulcer without hemorrhage or perforation: Secondary | ICD-10-CM

## 2020-06-03 DIAGNOSIS — D62 Acute posthemorrhagic anemia: Secondary | ICD-10-CM

## 2020-06-03 DIAGNOSIS — D509 Iron deficiency anemia, unspecified: Secondary | ICD-10-CM | POA: Diagnosis not present

## 2020-06-03 DIAGNOSIS — R195 Other fecal abnormalities: Secondary | ICD-10-CM

## 2020-06-03 DIAGNOSIS — R935 Abnormal findings on diagnostic imaging of other abdominal regions, including retroperitoneum: Secondary | ICD-10-CM | POA: Diagnosis not present

## 2020-06-03 DIAGNOSIS — K2900 Acute gastritis without bleeding: Secondary | ICD-10-CM

## 2020-06-03 DIAGNOSIS — I2510 Atherosclerotic heart disease of native coronary artery without angina pectoris: Secondary | ICD-10-CM

## 2020-06-03 HISTORY — PX: BIOPSY: SHX5522

## 2020-06-03 HISTORY — PX: ESOPHAGOGASTRODUODENOSCOPY (EGD) WITH PROPOFOL: SHX5813

## 2020-06-03 LAB — CBC WITH DIFFERENTIAL/PLATELET
Abs Immature Granulocytes: 0.16 10*3/uL — ABNORMAL HIGH (ref 0.00–0.07)
Basophils Absolute: 0 10*3/uL (ref 0.0–0.1)
Basophils Relative: 0 %
Eosinophils Absolute: 0.5 10*3/uL (ref 0.0–0.5)
Eosinophils Relative: 4 %
HCT: 23.8 % — ABNORMAL LOW (ref 39.0–52.0)
Hemoglobin: 7.6 g/dL — ABNORMAL LOW (ref 13.0–17.0)
Immature Granulocytes: 1 %
Lymphocytes Relative: 9 %
Lymphs Abs: 1.1 10*3/uL (ref 0.7–4.0)
MCH: 28.3 pg (ref 26.0–34.0)
MCHC: 31.9 g/dL (ref 30.0–36.0)
MCV: 88.5 fL (ref 80.0–100.0)
Monocytes Absolute: 1.5 10*3/uL — ABNORMAL HIGH (ref 0.1–1.0)
Monocytes Relative: 12 %
Neutro Abs: 9 10*3/uL — ABNORMAL HIGH (ref 1.7–7.7)
Neutrophils Relative %: 74 %
Platelets: 233 10*3/uL (ref 150–400)
RBC: 2.69 MIL/uL — ABNORMAL LOW (ref 4.22–5.81)
RDW: 15.9 % — ABNORMAL HIGH (ref 11.5–15.5)
WBC: 12.3 10*3/uL — ABNORMAL HIGH (ref 4.0–10.5)
nRBC: 0 % (ref 0.0–0.2)

## 2020-06-03 LAB — BASIC METABOLIC PANEL
Anion gap: 10 (ref 5–15)
BUN: 16 mg/dL (ref 8–23)
CO2: 29 mmol/L (ref 22–32)
Calcium: 6.3 mg/dL — CL (ref 8.9–10.3)
Chloride: 98 mmol/L (ref 98–111)
Creatinine, Ser: 1.65 mg/dL — ABNORMAL HIGH (ref 0.61–1.24)
GFR calc Af Amer: 43 mL/min — ABNORMAL LOW (ref >60)
GFR calc non Af Amer: 37 mL/min — ABNORMAL LOW (ref >60)
Glucose, Bld: 127 mg/dL — ABNORMAL HIGH (ref 70–99)
Potassium: 3.5 mmol/L (ref 3.5–5.1)
Sodium: 137 mmol/L (ref 135–145)

## 2020-06-03 LAB — CBC
HCT: 28.4 % — ABNORMAL LOW (ref 39.0–52.0)
Hemoglobin: 9 g/dL — ABNORMAL LOW (ref 13.0–17.0)
MCH: 28.5 pg (ref 26.0–34.0)
MCHC: 31.7 g/dL (ref 30.0–36.0)
MCV: 89.9 fL (ref 80.0–100.0)
Platelets: 285 10*3/uL (ref 150–400)
RBC: 3.16 MIL/uL — ABNORMAL LOW (ref 4.22–5.81)
RDW: 15.8 % — ABNORMAL HIGH (ref 11.5–15.5)
WBC: 13.5 10*3/uL — ABNORMAL HIGH (ref 4.0–10.5)
nRBC: 0 % (ref 0.0–0.2)

## 2020-06-03 LAB — LACTIC ACID, PLASMA
Lactic Acid, Venous: 0.8 mmol/L (ref 0.5–1.9)
Lactic Acid, Venous: 1.1 mmol/L (ref 0.5–1.9)

## 2020-06-03 IMAGING — DX DG CHEST 2V
2 series · 2 of 2 positions shown · non-contrast
Comparison: [DATE] CT. [DATE] chest radiograph from [facility]
[REDACTED].

CLINICAL DATA: Hypoxia

EXAM:
CHEST - 2 VIEW

[chest pa]
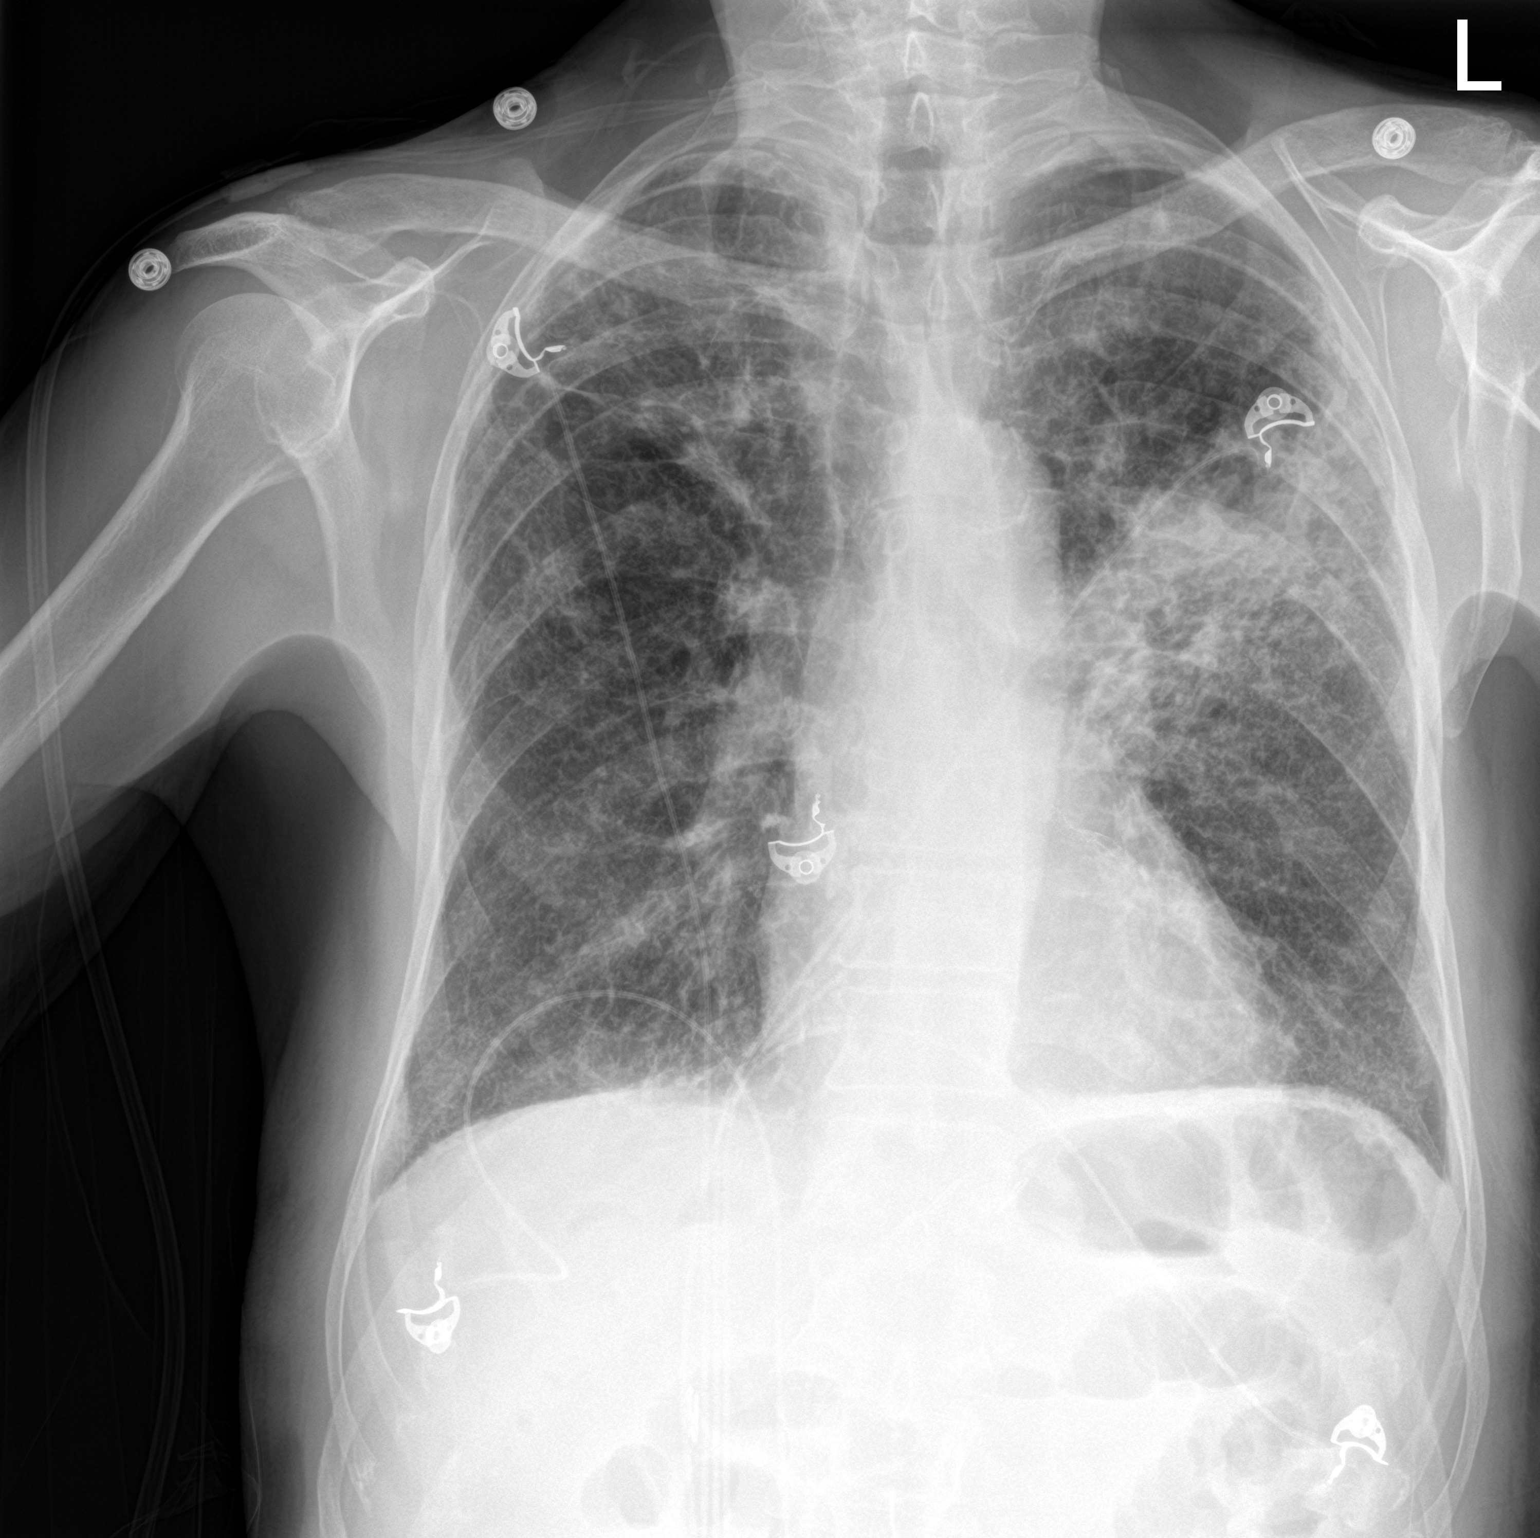

[chest lat]
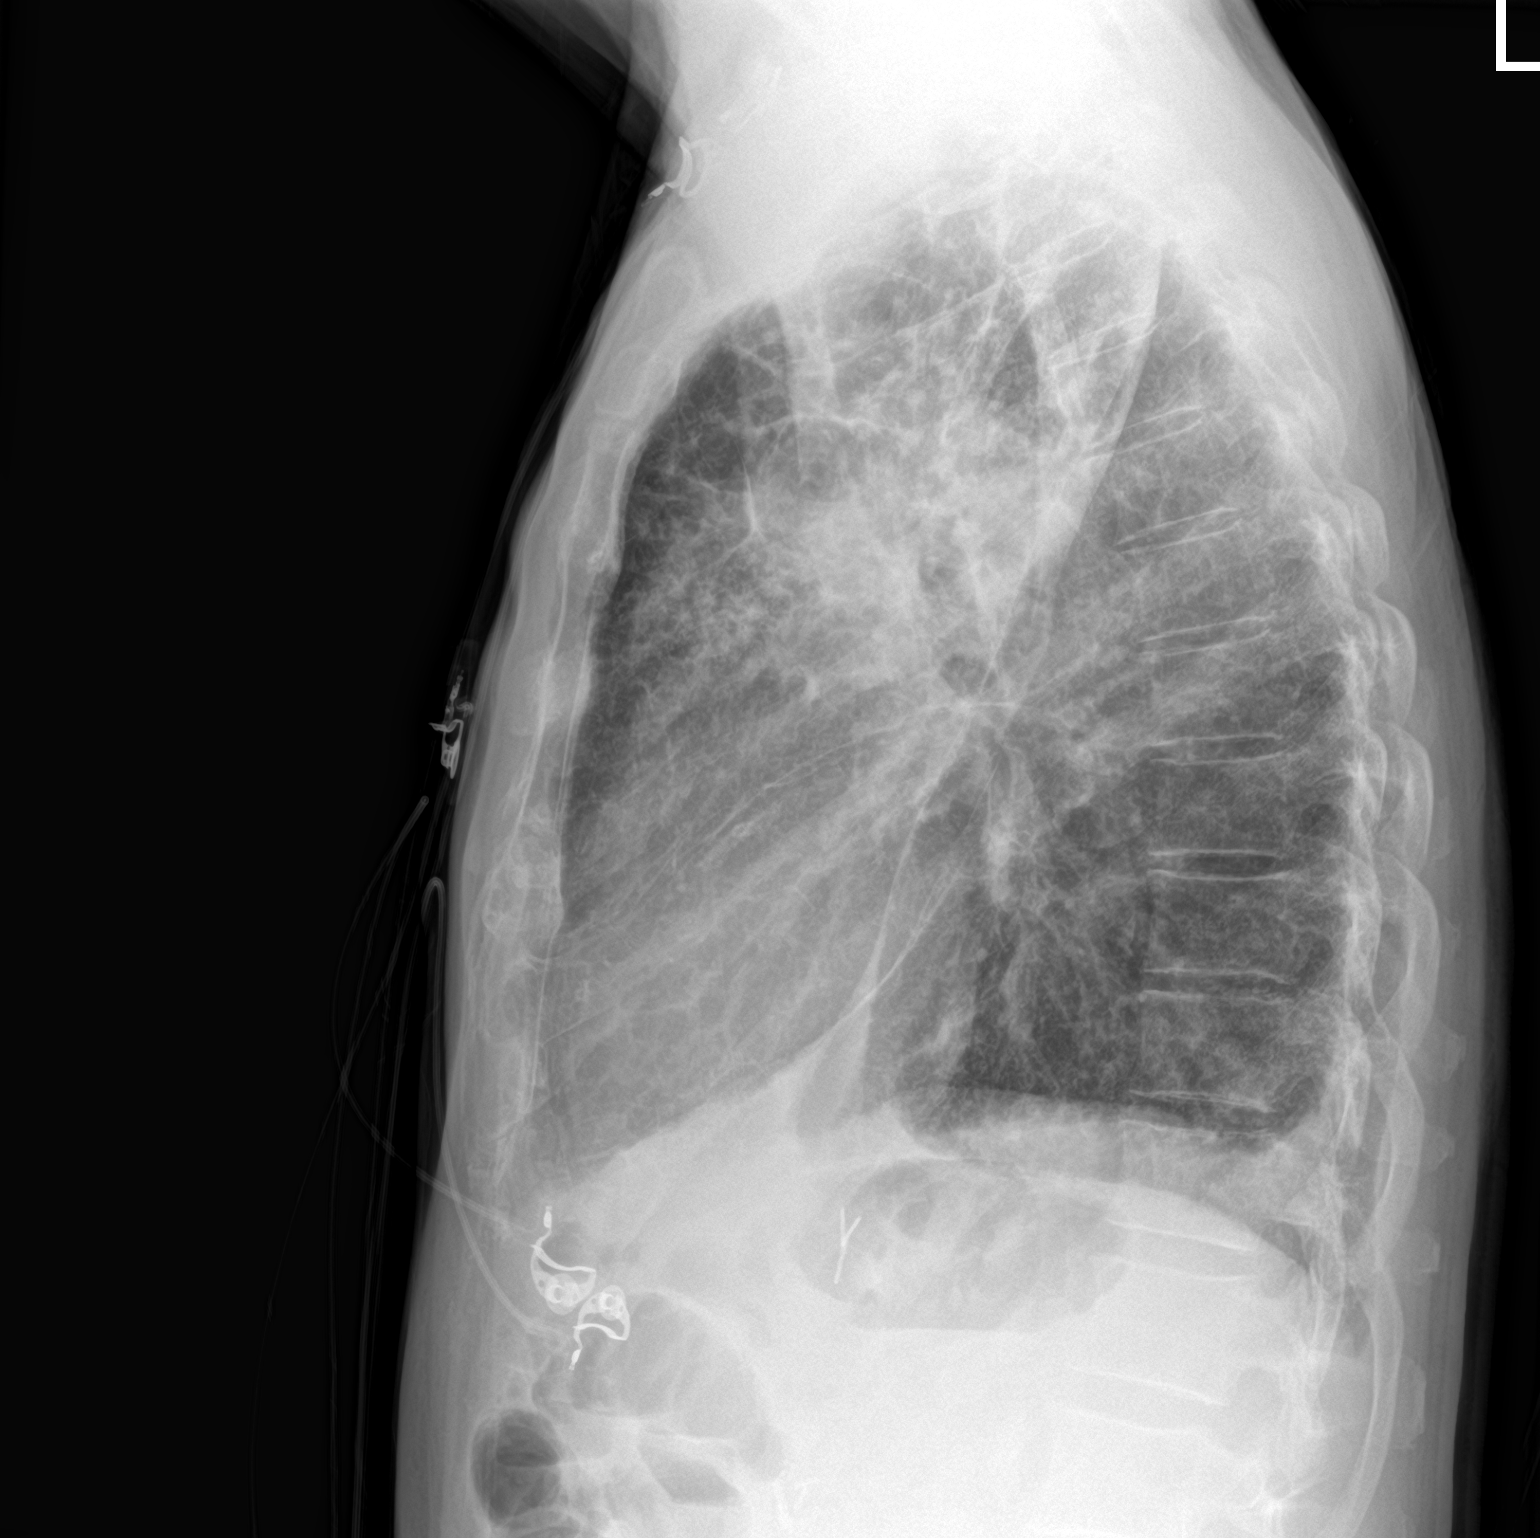

[2 of 2 positions shown; findings below may reference images not displayed]

FINDINGS: Mild hyperinflation. Patient rotated minimally right. Normal heart
size. Atherosclerosis in the transverse aorta. No pleural effusion
or pneumothorax. Diffuse interstitial thickening. Left greater than
right upper lobe airspace disease is slightly increased. Right lower
lobe airspace disease is similar.
IMPRESSION: Left greater than right airspace disease, overall progressive. Favor
pneumonia.

Underlying interstitial thickening likely relates to
emphysema/chronic bronchitis.

Aortic Atherosclerosis ([22]-[22]).

## 2020-06-03 SURGERY — ESOPHAGOGASTRODUODENOSCOPY (EGD) WITH PROPOFOL
Anesthesia: Monitor Anesthesia Care

## 2020-06-03 MED ORDER — PROPOFOL 500 MG/50ML IV EMUL
INTRAVENOUS | Status: DC | PRN
  Administered 2020-06-03: 120 ug/kg/min via INTRAVENOUS

## 2020-06-03 MED ORDER — PROPOFOL 10 MG/ML IV BOLUS
INTRAVENOUS | Status: DC | PRN
  Administered 2020-06-03: 20 mg via INTRAVENOUS
  Administered 2020-06-03 (×2): 10 mg via INTRAVENOUS

## 2020-06-03 MED ORDER — SODIUM CHLORIDE 0.9 % IV BOLUS
250.0000 mL | Freq: Once | INTRAVENOUS | Status: AC
  Administered 2020-06-03: 250 mL via INTRAVENOUS

## 2020-06-03 MED ORDER — PHENYLEPHRINE HCL (PRESSORS) 10 MG/ML IV SOLN
INTRAVENOUS | Status: DC | PRN
  Administered 2020-06-03: 80 ug via INTRAVENOUS
  Administered 2020-06-03: 100 ug via INTRAVENOUS
  Administered 2020-06-03 (×2): 80 ug via INTRAVENOUS
  Administered 2020-06-03: 40 ug via INTRAVENOUS
  Administered 2020-06-03: 80 ug via INTRAVENOUS

## 2020-06-03 MED ORDER — LACTATED RINGERS IV SOLN: INTRAVENOUS | Status: DC | PRN

## 2020-06-03 MED ORDER — FENTANYL CITRATE (PF) 100 MCG/2ML IJ SOLN
INTRAMUSCULAR | Status: AC
  Filled 2020-06-03: qty 2

## 2020-06-03 MED ORDER — LIDOCAINE 2% (20 MG/ML) 5 ML SYRINGE
INTRAMUSCULAR | Status: DC | PRN
  Administered 2020-06-03: 50 mg via INTRAVENOUS

## 2020-06-03 MED ORDER — ACETAMINOPHEN 325 MG PO TABS
650.0000 mg | ORAL_TABLET | Freq: Four times a day (QID) | ORAL | Status: DC | PRN
  Administered 2020-06-03 – 2020-06-08 (×4): 650 mg via ORAL
  Filled 2020-06-03 (×4): qty 2

## 2020-06-03 MED ORDER — EPHEDRINE SULFATE 50 MG/ML IJ SOLN
INTRAMUSCULAR | Status: DC | PRN
  Administered 2020-06-03 (×2): 5 mg via INTRAVENOUS

## 2020-06-03 MED ORDER — PROPOFOL 500 MG/50ML IV EMUL
INTRAVENOUS | Status: AC
  Filled 2020-06-03: qty 50

## 2020-06-03 SURGICAL SUPPLY — 15 items

## 2020-06-03 NOTE — Op Note (Signed)
[facility] Patient Name: [name] [name] Procedure Date: 06/03/2020 MRN: [id number] Attending MD: Jerene Bears , MD Date of Birth: June 12, 1930 CSN: [id number] Age: 86 Admit Type: Outpatient Procedure:                Upper GI endoscopy Indications:              Acute, progressive normocytic anemia, Heme positive                            stool Providers:                Lajuan Lines. Hilarie Fredrickson, MD, Benay Pillow, RN, Lina Sar, Technician, Dayle Points, CRNA Referring MD:             [facility] Medicines:                Monitored Anesthesia Care Complications:            No immediate complications. Estimated Blood Loss:     Estimated blood loss was minimal. Procedure:                Pre-Anesthesia Assessment:                           - Prior to the procedure, a History and Physical                            was performed, and patient medications and                            allergies were reviewed. The patient's tolerance of                            previous anesthesia was also reviewed. The risks                            and benefits of the procedure and the sedation                            options and risks were discussed with the patient.                            All questions were answered, and informed consent                            was obtained. Prior Anticoagulants: The patient has                            taken no previous anticoagulant or antiplatelet                            agents. ASA Grade Assessment: III - A patient with  severe systemic disease. After reviewing the risks                            and benefits, the patient was deemed in                            satisfactory condition to undergo the procedure.                           After obtaining informed consent, the endoscope was                            passed under direct vision. Throughout the                             procedure, the patient's blood pressure, pulse, and                            oxygen saturations were monitored continuously. The                            [id number]-[id number] ([id number]) Olympus gastroscope was                            introduced through the mouth, and advanced to the                            second part of duodenum. The upper GI endoscopy was                            accomplished without difficulty. The patient                            tolerated the procedure well. Scope In: Scope Out: Findings:      The esophagus and gastroesophageal junction were examined with white       light and narrow band imaging (NBI) from a forward view and retroflexed       position. There were esophageal mucosal changes consistent with       long-segment Barrett's esophagus. These changes involved the mucosa at       the upper extent of the gastric folds (40 cm from the incisors)       extending to the Z-line (35 cm from the incisors). Circumferential       salmon-colored mucosa was present from 36 to 40 cm, two tongues of       salmon-colored mucosa were present from 35 to 36 cm and no visible       abnormalities were present. The maximum longitudinal extent of these       esophageal mucosal changes was 5 cm in length. Mucosa was biopsied with       a cold forceps for histology for diagnostic purposes only and to exclude       dysplasia. One specimen bottle was sent to pathology.      A 2 cm hiatal hernia was present located 40-42 cm from the incisors.      Moderate inflammation  characterized by erosions, erythema and shallow       ulcerations (2) was found in the gastric body, in the gastric antrum and       prepyloric stomach. Biopsies were taken with a cold forceps for       histology and Helicobacter pylori testing. No evidence of active       bleeding.      Five non-bleeding cratered duodenal ulcers with no stigmata of bleeding       were found in the duodenal bulb. The largest lesion  was 7 mm in largest       dimension. There is duodenitis felt peptic in nature in the bulb and       duodenal sweep.      The second portion of the duodenum was normal. Impression:               - Esophageal mucosal changes consistent with                            long-segment Barrett's esophagus. Biopsied.                           - 2 cm hiatal hernia.                           - Acute gastritis with 2 small gastric ulcers and                            scattered erosions. Biopsied to exclude H. Pylori.                           - Non-bleeding duodenal ulcers and duodenitis with                            no stigmata of bleeding.                           - Normal second portion of the duodenum. Moderate Sedation:      N/A Recommendation:           - Return patient to hospital ward for ongoing care.                           - Advance diet as tolerated.                           - Continue present medications.                           - BID PPI is recommended for at least 8 weeks, then                            daily thereafter.                           - Avoid aspirin, ibuprofen, naproxen, or other  non-steroidal anti-inflammatory drugs. Low dose                            aspirin is okay if necessary for cardiovascular                            disease (use enteric coated).                           - Await pathology results.                           - Outpatient (versus inpatient) MRI/MCRP to further                            characterize biliary dilation seen by recent CT                            abdomen.                           - Outpatient follow-up with Dr. Havery Moros is                            recommended after discharge. Procedure Code(s):        --- Professional ---                           [id number], Esophagogastroduodenoscopy, flexible,                            transoral; with biopsy, single or multiple Diagnosis Code(s):         --- Professional ---                           K22.8, Other specified diseases of esophagus                           K44.9, Diaphragmatic hernia without obstruction or                            gangrene                           K29.00, Acute gastritis without bleeding                           K26.9, Duodenal ulcer, unspecified as acute or                            chronic, without hemorrhage or perforation                           D62, Acute posthemorrhagic anemia                           R19.5, Other fecal abnormalities CPT copyright 2019 American Medical Association. All  rights reserved. The codes documented in this report are preliminary and upon coder review may  be revised to meet current compliance requirements. Jerene Bears, MD 06/03/2020 10:17:07 AM This report has been signed electronically. Number of Addenda: 0

## 2020-06-03 NOTE — Progress Notes (Signed)
PROGRESS NOTE  Stanford Strauch PJS:[id number] DOB: Jan 21, 1930 DOA: 05/31/2020 PCP: Drake Leach, MD  Brief History   [id number] Mooreis a 86 y.o.malewithhistory of CAD status post stenting, CHF, CML, chronic kidney disease stage III who has 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 After 4 days patient again had to go to his primary care because symptoms did not improve and [name] was added to his amoxicillin. On "Sunday there is 48 hours ago patient had multiple episodes of diarrhea at least 10 episodes with some vomiting. Denies any abdominal pain. He took Imodium yesterday morning following which his diarrhea stopped but after about 8 hours later he started having diarrhea again. At this point he decided to come to the ER. Denies chest pain or shortness of breath. ED Course:In the ER patient had blood work done which showed marked leukocytosis of 23,000 creatinine worsened from 1.6-3.12 CT chest abdomen pelvis was done which shows bilateral upper lobe infiltrates. CT abdomen was largely unremarkable except for small abdominal aneurysm. Patient was started on empiric antibiotics and admitted for further management of acute renal failure with diarrhea. Stool for occult blood was positive but patient did not see any obvious bleeding. As patient's hemoglobin has dropped from 13.3 to 8.3, GI was consulted. They plan on doing EGD later today.  EGD demonstrated Barrett's esophagus, erosive gastritis with (2)  shallow ulcerations in the stomach, and 5 non-bleeding duodenal ulcers and duodenitis with no stigmata of bleeding . The second part of the duodenum was normal.   Consultants  . Gastroenterology  Procedures  . EGD  Antibiotics   Anti-infectives (From admission, onward)    Start     Dose/Rate Route Frequency Ordered Stop   06/01/20 1830  ceFEPIme (MAXIPIME) 2 g in sodium chloride 0.9 % 100 mL IVPB     Discontinue     2 g 200 mL/hr over 30 Minutes Intravenous Every 24 hours 05/31/20 1831     05/31/20 1830  vancomycin variable dose per unstable renal function (pharmacist dosing)  Status:  Discontinued         Does not apply See admin instructions 05/31/20 1831 06/02/20 1348   05/31/20 1815  ceFEPIme (MAXIPIME) 2 g in sodium chloride 0.9 % 100 mL IVPB        2 g 200 mL/hr over 30 Minutes Intravenous  Once 05/31/20 1801 05/31/20 1919   05/31/20 1815  metroNIDAZOLE (FLAGYL) IVPB 500 mg        50" 0 mg 100 mL/hr over 60 Minutes Intravenous  Once 05/31/20 1801 05/31/20 2253   05/31/20 1815  vancomycin (VANCOCIN) IVPB 1000 mg/200 mL premix        1,000 mg 200 mL/hr over 60 Minutes Intravenous  Once 05/31/20 1809 06/01/20 0001      Subjective  The patient is resting comfortably. No new complaints. He has agreed to go forward with EGD.  Objective   Vitals:  Vitals:   06/03/20 1020 06/03/20 1241  BP: (!) 140/29 108/64  Pulse: 61 63  Resp: 19 16  Temp:  97.7 F (36.5 C)  SpO2: 100% 95%   Exam:  Constitutional:  . The patient is awake, alert, and oriented x 3. No acute distress. Respiratory:  . No increased work of breathing. . No wheezes, rales, or rhonchi . No  tactile fremitus Cardiovascular:  . Regular rate and rhythm . No murmurs, ectopy, or gallups. . No lateral PMI. No thrills. Abdomen:  . Abdomen is soft, non-tender, non-distended . No hernias, masses, or organomegaly . Normoactive bowel sounds.  Musculoskeletal:  . No cyanosis, clubbing, or edema Skin:  . No rashes, lesions, ulcers . palpation of skin: no induration or nodules Neurologic:  . CN 2-12 intact . Sensation all 4 extremities intact Psychiatric:  . Mental status o Mood, affect appropriate o Orientation to person, place, time  . judgment and insight appear intact  I have  personally reviewed the following:   Today's Data  . Vitals, CBC, CMP   Micro Data  . Blood cultures x 2 - No growth . MRSA by PCR - negative  Scheduled Meds: . carvedilol  12.5 mg Oral BID WC  . ezetimibe  10 mg Oral Daily  . pantoprazole  40 mg Oral BID AC   Continuous Infusions: . ceFEPime (MAXIPIME) IV Stopped (06/03/20 1240)  . sodium bicarbonate 150 mEq in dextrose 5% 1000 mL 150 mEq (06/03/20 0742)    Principal Problem:   Acute renal failure superimposed on stage 3 chronic kidney disease (HCC) Active Problems:   Chronic systolic CHF (congestive heart failure) (HCC)   CAD (coronary artery disease)   Lung nodule   Enteritis   ARF (acute renal failure) (HCC)   Heme positive stool   Acute blood loss anemia   Acute gastric ulcer without hemorrhage or perforation   Duodenal ulcer disease   Barrett's esophagus with dysplasia   LOS: 2 days   A & P  Acute on chronic kidney disease stage III: Improved with decrease in creatinine from 2.37 to 1.65 with IV fluids. Elevation in creatinine was precipitated by patient's diarrhea and  Vomiting through volume depletion.  Patient also takes ARB which could have further worsen.  Will hold off ARB patient received fluids in the ER. Monitor creatinine, electrolytes, and volume status. Avoid nephrotoxins and hypotensions.  Diarrhea and vomiting: Resolved. This presentation could be from recent use of antibiotics but patient also has history of multiple midgut volvulus CT does not show any signs of that now.  Patient also was recently treated for SBO. Stool was found to be C Diff negative, but FOBT positive.  Acute blood loss anemia: Due to upper GI bleed. Drop in hemoglobin from 13.3 on presentation on 05/31/2020 to 8.3 this morning. FOBT positive. Monitor hemoglobin and transfuse for hemoglobin less than 7.0. Monitor.  GI bleed: demonstrated Barrett's esophagus, erosive gastritis with (2)  shallow ulcerations in the stomach, and 5  non-bleeding duodenal ulcers and duodenitis with no stigmata of bleeding . The second part of the duodenum was normal. The patietn's diet will be advanced as tolerated. He will be continued on BID PPI x 8 weeks then dropping down to 1 PO daily. The patient is to avoid ASA, NSAIDS, although low dose ASA is acceptable. He is to follow up with Dr. Havery Moros at discharge.  Pneumonia presently on antibiotics.  CAD status post stenting denies chest pain.  On aspirin and Zetia.  History of CHF presently holding of ARB due to acute renal failure.  Hold of any diuretics.  History of CML on Bosulif.  Since patient has multiple comorbidities with acute on chronic kidney disease will need close monitoring for any further worsening in inpatient status.   DVT prophylaxis: SCDs for now until you make sure there is no significant fall in hemoglobin. Code Status: Full  code. Family Communication: Discussed with patient. Disposition Plan: The patient is from home. Anticipate discharge to home. Barriers to discharge: need to monitor hemoglobin for stability. Status is: Inpatient  Remains inpatient appropriate because:Hemodynamically unstable and Inpatient level of care appropriate due to severity of illness   Dispo: The patient is from: Home              Anticipated d/c is to: Home              Anticipated d/c date is: 2 days              Patient currently is not medically stable to d/c.   Ottilia Pippenger, DO [facility] Direct contact: see [email or web address]  7PM-7AM contact night coverage as above 06/03/2020, 6:51 PM  LOS: 1 day

## 2020-06-03 NOTE — Transfer of Care (Signed)
Immediate Anesthesia Transfer of Care Note  Patient: [name] [name]  Procedure(s) Performed: ESOPHAGOGASTRODUODENOSCOPY (EGD) WITH PROPOFOL (N/A ) BIOPSY  Patient Location: Endoscopy Unit  Anesthesia Type:MAC  Level of Consciousness: awake, drowsy, patient cooperative and responds to stimulation  Airway & Oxygen Therapy: Patient Spontanous Breathing and Patient connected to face mask oxygen  Post-op Assessment: Report given to RN and Post -op Vital signs reviewed and stable  Post vital signs: Reviewed and stable  Last Vitals:  Vitals Value Taken Time  BP 106/25 06/03/20 1005  Temp    Pulse 54 06/03/20 1006  Resp 19 06/03/20 1006  SpO2 100 % 06/03/20 1006  Vitals shown include unvalidated device data.  Last Pain:  Vitals:   06/03/20 1005  TempSrc: (P) Oral  PainSc:       Patients Stated Pain Goal: 0 (34/75/83 0746)  Complications: No complications documented.

## 2020-06-03 NOTE — Progress Notes (Signed)
Pharmacy Antibiotic Note  [name] [name] is a 86 y.o. 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 Chest CTA on 7/12 showed "superimposed bilateral upper lobe pneumonic infiltrate".  He's currently on cefepime for PNA.  Today, 06/03/2020: - day #3 abx - afeb, wbc elevated 13.5 - scr trending down 1.65 (crcl~26), on sodium bicarb drip - all cultures have been negative thus far  Plan: - continue cefepime 2gm IV q24h - monitor renal function closely and adjust dose if/when appropriate - f/u cultures  _____________________________________  Height: 5\' 9"  (175.3 cm) Weight: 59.6 kg (131 lb 6.4 oz) IBW/kg (Calculated) : 70.7  Temp (24hrs), Avg:98 F (36.7 C), Min:97.7 F (36.5 C), Max:98.3 F (36.8 C)  Recent Labs  Lab 05/31/20 1402 05/31/20 1402 05/31/20 1617 06/01/20 0609 06/01/20 1521 06/01/20 2238 06/02/20 0625 06/02/20 1505 06/03/20 0531 06/03/20 1105  WBC 23.8*   < >  --  12.3*   < > 12.4* 12.1* 12.2* 12.3* 13.5*  CREATININE 3.12*  --   --  2.37*  --   --   --   --  1.65*  --   LATICACIDVEN  --   --  1.1 0.7  --   --   --   --  0.8 1.1   < > = values in this interval not displayed.    Estimated Creatinine Clearance: 26.6 mL/min (A) (by C-G formula based on SCr of 1.65 mg/dL (H)).    Allergies  Allergen Reactions  . Crestor [Rosuvastatin] Itching  . Percocet [Oxycodone-Acetaminophen] Itching  . Spironolactone Other (See Comments)    Nipples sore and swelling   . Metronidazole Other (See Comments)    Insomnia and nervousness    Antimicrobials this admission:  7/12 Cefepime >> 7/12 Vanco >>7/14 7/12 flagyl x 1 dose  Microbiology results:  7/12 BCx2:  7/12 C diff neg 7/12 covid neg 7/13 MRSA: neg  Thank you for allowing pharmacy to be a part of this patient's care.  Lynelle Doctor 06/03/2020 1:39 PM

## 2020-06-03 NOTE — Procedures (Addendum)
Discussed results, treatment plan after EGD with patient's son by phone. Son will ensure no NSAID use at home.  Pt denies and uses Tylenol PM qHS Time provided for questions and answers and he thanked me for the call

## 2020-06-03 NOTE — Interval H&P Note (Signed)
History and Physical Interval Note: For EGD this am.  Some nausea this morning, no vomiting. Slight drift in Hgb without overt bleeding or melena The nature of the procedure, as well as the risks, benefits, and alternatives were carefully and thoroughly reviewed with the patient. Ample time for discussion and questions allowed. The patient understood, was satisfied, and agreed to proceed.  Eventual MRCP   06/03/2020 9:29 AM  [name] [name]  has presented today for surgery, with the diagnosis of upper GI bleed.  The various methods of treatment have been discussed with the patient and family. After consideration of risks, benefits and other options for treatment, the patient has consented to  Procedure(s): ESOPHAGOGASTRODUODENOSCOPY (EGD) WITH PROPOFOL (N/A) as a surgical intervention.  The patient's history has been reviewed, patient examined, no change in status, stable for surgery.  I have reviewed the patient's chart and labs.  Questions were answered to the patient's satisfaction.     [name] [name]

## 2020-06-03 NOTE — Anesthesia Postprocedure Evaluation (Signed)
Anesthesia Post Note  Patient: [name] [name]  Procedure(s) Performed: ESOPHAGOGASTRODUODENOSCOPY (EGD) WITH PROPOFOL (N/A ) BIOPSY     Patient location during evaluation: Endoscopy Anesthesia Type: MAC Level of consciousness: awake and alert, oriented and patient cooperative Pain management: pain level controlled Respiratory status: spontaneous breathing, nonlabored ventilation and respiratory function stable Cardiovascular status: blood pressure returned to baseline and stable Postop Assessment: no apparent nausea or vomiting Anesthetic complications: no   No complications documented.  Last Vitals:  Vitals:   06/03/20 1020 06/03/20 1241  BP: (!) 140/29 108/64  Pulse: 61 63  Resp: 19 16  Temp:  36.5 C  SpO2: 100% 95%    Last Pain:  Vitals:   06/03/20 1241  TempSrc: Oral  PainSc:                  [name] [name],[name] [name]

## 2020-06-03 NOTE — Anesthesia Preprocedure Evaluation (Addendum)
Anesthesia Evaluation  Patient identified by MRN, date of birth, ID band Patient awake    Reviewed: Allergy & Precautions, NPO status , Patient's Chart, lab work & pertinent test results, reviewed documented beta blocker date and time   History of Anesthesia Complications Negative for: history of anesthetic complications  Airway Mallampati: II  TM Distance: >3 FB Neck ROM: Full    Dental  (+) Chipped, Dental Advisory Given   Pulmonary former smoker,  05/31/2020 SARS coronavirus NEG H/o vocal cord polyp: s/p collagen injection   breath sounds clear to auscultation       Cardiovascular hypertension, Pt. on medications and Pt. on home beta blockers (-) angina+ CAD, + Cardiac Stents and +CHF   Rhythm:Regular Rate:Normal  '19 ECHO:  moderate LV concentric hypertrophy. Systolic function was moderately toseverely reduced. EF 30-35%. Akinesis of the mid-apicalanteroseptal,anterior, and apical myocardium. Severe hypokinesis of the inferior myocardium. Mild-mod AI, mod MR   Neuro/Psych negative neurological ROS     GI/Hepatic Neg liver ROS, GERD  Medicated and Controlled,  Endo/Other  negative endocrine ROS  Renal/GU Renal InsufficiencyRenal disease (creat 1.65)     Musculoskeletal   Abdominal   Peds  Hematology  (+) Blood dyscrasia (Hb 7.6), anemia , CML   Anesthesia Other Findings   Reproductive/Obstetrics                            Anesthesia Physical Anesthesia Plan  ASA: IV  Anesthesia Plan: MAC   Post-op Pain Management:    Induction:   PONV Risk Score and Plan: 1 and Treatment may vary due to age or medical condition  Airway Management Planned: Natural Airway and Nasal Cannula  Additional Equipment: None  Intra-op Plan:   Post-operative Plan:   Informed Consent: I have reviewed the patients History and Physical, chart, labs and discussed the procedure including the risks,  benefits and alternatives for the proposed anesthesia with the patient or authorized representative who has indicated his/her understanding and acceptance.     Dental advisory given  Plan Discussed with: CRNA and Surgeon  Anesthesia Plan Comments:        Anesthesia Quick Evaluation

## 2020-06-04 ENCOUNTER — Other Ambulatory Visit: Payer: Self-pay

## 2020-06-04 ENCOUNTER — Encounter (HOSPITAL_COMMUNITY): Payer: Self-pay | Admitting: Internal Medicine

## 2020-06-04 DIAGNOSIS — K449 Diaphragmatic hernia without obstruction or gangrene: Secondary | ICD-10-CM

## 2020-06-04 DIAGNOSIS — K253 Acute gastric ulcer without hemorrhage or perforation: Secondary | ICD-10-CM

## 2020-06-04 DIAGNOSIS — N179 Acute kidney failure, unspecified: Principal | ICD-10-CM

## 2020-06-04 DIAGNOSIS — K2270 Barrett's esophagus without dysplasia: Secondary | ICD-10-CM

## 2020-06-04 DIAGNOSIS — K269 Duodenal ulcer, unspecified as acute or chronic, without hemorrhage or perforation: Secondary | ICD-10-CM

## 2020-06-04 DIAGNOSIS — N183 Chronic kidney disease, stage 3 unspecified: Secondary | ICD-10-CM

## 2020-06-04 LAB — COMPREHENSIVE METABOLIC PANEL
ALT: 11 U/L (ref 0–44)
AST: 12 U/L — ABNORMAL LOW (ref 15–41)
Albumin: 1.8 g/dL — ABNORMAL LOW (ref 3.5–5.0)
Alkaline Phosphatase: 116 U/L (ref 38–126)
Anion gap: 9 (ref 5–15)
BUN: 14 mg/dL (ref 8–23)
CO2: 28 mmol/L (ref 22–32)
Calcium: 6.4 mg/dL — CL (ref 8.9–10.3)
Chloride: 95 mmol/L — ABNORMAL LOW (ref 98–111)
Creatinine, Ser: 1.66 mg/dL — ABNORMAL HIGH (ref 0.61–1.24)
GFR calc Af Amer: 42 mL/min — ABNORMAL LOW (ref >60)
GFR calc non Af Amer: 37 mL/min — ABNORMAL LOW (ref >60)
Glucose, Bld: 122 mg/dL — ABNORMAL HIGH (ref 70–99)
Potassium: 4.2 mmol/L (ref 3.5–5.1)
Sodium: 132 mmol/L — ABNORMAL LOW (ref 135–145)
Total Bilirubin: 0.5 mg/dL (ref 0.3–1.2)
Total Protein: 5.3 g/dL — ABNORMAL LOW (ref 6.5–8.1)

## 2020-06-04 LAB — CBC
HCT: 25.3 % — ABNORMAL LOW (ref 39.0–52.0)
Hemoglobin: 8 g/dL — ABNORMAL LOW (ref 13.0–17.0)
MCH: 28.7 pg (ref 26.0–34.0)
MCHC: 31.6 g/dL (ref 30.0–36.0)
MCV: 90.7 fL (ref 80.0–100.0)
Platelets: 244 10*3/uL (ref 150–400)
RBC: 2.79 MIL/uL — ABNORMAL LOW (ref 4.22–5.81)
RDW: 15.8 % — ABNORMAL HIGH (ref 11.5–15.5)
WBC: 13.8 10*3/uL — ABNORMAL HIGH (ref 4.0–10.5)
nRBC: 0 % (ref 0.0–0.2)

## 2020-06-04 LAB — VITAMIN D 25 HYDROXY (VIT D DEFICIENCY, FRACTURES): Vit D, 25-Hydroxy: 7.15 ng/mL — ABNORMAL LOW (ref 30–100)

## 2020-06-04 LAB — SURGICAL PATHOLOGY

## 2020-06-04 MED ORDER — CHOLECALCIFEROL 10 MCG (400 UNIT) PO TABS
400.0000 [IU] | ORAL_TABLET | Freq: Every day | ORAL | Status: DC
  Administered 2020-06-05 – 2020-06-09 (×5): 400 [IU] via ORAL
  Filled 2020-06-04 (×6): qty 1

## 2020-06-04 MED ORDER — CALCIUM GLUCONATE-NACL 1-0.675 GM/50ML-% IV SOLN
1.0000 g | Freq: Once | INTRAVENOUS | Status: AC
  Administered 2020-06-04: 1000 mg via INTRAVENOUS
  Filled 2020-06-04: qty 50

## 2020-06-04 MED ORDER — CEFDINIR 300 MG PO CAPS: 300.0000 mg | ORAL_CAPSULE | Freq: Two times a day (BID) | ORAL | Status: DC

## 2020-06-04 MED ORDER — CEFDINIR 300 MG PO CAPS
300.0000 mg | ORAL_CAPSULE | Freq: Every day | ORAL | Status: DC
  Administered 2020-06-04 – 2020-06-05 (×2): 300 mg via ORAL
  Filled 2020-06-04 (×2): qty 1

## 2020-06-04 NOTE — Care Management Important Message (Signed)
Important Message  Patient Details IM Letter presented to the Patient Name: [name] [name] MRN: [id number] Date of Birth: 04-15-1930   Medicare Important Message Given:  Yes     Kerin Salen 06/04/2020, 4:24 PM

## 2020-06-04 NOTE — Progress Notes (Signed)
PROGRESS NOTE  [name] [name] XYD:[id number] DOB: 05-05-1930 DOA: 05/31/2020 PCP: [name] Leach, MD  Brief History   [name] [name] a 86 y.o.malewithhistory of CAD status post stenting, CHF, CML, chronic kidney disease stage III who has 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 After 4 days patient again had to go to his primary care because symptoms did not improve and [name] was added to his amoxicillin. On "Sunday there is 48 hours ago patient had multiple episodes of diarrhea at least 10 episodes with some vomiting. Denies any abdominal pain. He took Imodium yesterday morning following which his diarrhea stopped but after about 8 hours later he started having diarrhea again. At this point he decided to come to the ER. Denies chest pain or shortness of breath. ED Course:In the ER patient had blood work done which showed marked leukocytosis of 23,000 creatinine worsened from 1.6-3.12 CT chest abdomen pelvis was done which shows bilateral upper lobe infiltrates. CT abdomen was largely unremarkable except for small abdominal aneurysm. Patient was started on empiric antibiotics and admitted for further management of acute renal failure with diarrhea. Stool for occult blood was positive but patient did not see any obvious bleeding. As patient's hemoglobin has dropped from 13.3 to 8.3, GI was consulted. They plan on doing EGD later today.  EGD demonstrated Barrett's esophagus, erosive gastritis with (2)  shallow ulcerations in the stomach, and 5 non-bleeding duodenal ulcers and duodenitis with no stigmata of bleeding . The second part of the duodenum was normal.   GI has recommended continuing PPI x 8 weeks followed by daily PPI with follow up with [name] [name] as outpatient both to  follow GI bleed and to have MRCP/MRI as outpatient. They have signed off. Consultants  . Gastroenterology  Procedures  . EGD  Antibiotics   Anti-infectives (From admission, onward)   Start     Dose/Rate Route Frequency Ordered Stop   06/04/20 1200  cefdinir (OMNICEF) capsule 300 mg     Discontinue     300 mg Oral Daily 06/04/20 1031     06/04/20 1045  cefdinir (OMNICEF) capsule 300 mg  Status:  Discontinued        300 mg Oral Every 12 hours 06/04/20 0957 06/04/20 1031   06/01/20 1830  ceFEPIme (MAXIPIME) 2 g in sodium chloride 0.9 % 100 mL IVPB  Status:  Discontinued        2 g 200 mL/hr over 30 Minutes Intravenous Every 24 hours 05/31/20 1831 06/04/20 0957   05/31/20 1830  vancomycin variable dose per unstable renal function (pharmacist dosing)  Status:  Discontinued         Does not apply See admin instructions 05/31/20 1831 06/02/20 1348   05/31/20 1815  ceFEPIme (MAXIPIME) 2 g in sodium chloride 0.9 % 100 mL IVPB        2 g 200 mL/hr over 30 Minutes Intravenous  Once 05/31/20 1801 05/31/20 1919   05/31/20 1815  metroNIDAZOLE (FLAGYL) IVPB 500 mg        50" 0 mg 100 mL/hr over 60 Minutes Intravenous  Once 05/31/20 1801 05/31/20 2253   05/31/20 1815  vancomycin (VANCOCIN) IVPB 1000 mg/200 mL premix        1,000 mg 200 mL/hr over 60 Minutes Intravenous  Once 05/31/20 1809 06/01/20 0001  Subjective  The patient is resting comfortably. No new complaints.  Objective   Vitals:  Vitals:   06/04/20 0659 06/04/20 1301  BP: (!) 113/55 (!) 142/60  Pulse: 60 80  Resp: 16 20  Temp: 98.1 F (36.7 C) (!) 97.2 F (36.2 C)  SpO2: 99% 94%   Exam:  Constitutional:  . The patient is awake, alert, and oriented x 3. No acute distress. Respiratory:  . No increased work of breathing. . No wheezes, rales, or rhonchi . No tactile fremitus Cardiovascular:  . Regular rate and rhythm . No murmurs, ectopy, or gallups. . No lateral PMI. No thrills. Abdomen:  . Abdomen is soft,  non-tender, non-distended . No hernias, masses, or organomegaly . Normoactive bowel sounds.  Musculoskeletal:  . No cyanosis, clubbing, or edema Skin:  . No rashes, lesions, ulcers . palpation of skin: no induration or nodules Neurologic:  . CN 2-12 intact . Sensation all 4 extremities intact Psychiatric:  . Mental status o Mood, affect appropriate o Orientation to person, place, time  . judgment and insight appear intact  I have personally reviewed the following:   Today's Data  . Vitals, CBC, CMP, Vitamin D  Micro Data  . Blood cultures x 2 - No growth . MRSA by PCR - negative  Scheduled Meds: . carvedilol  12.5 mg Oral BID WC  . cefdinir  300 mg Oral Daily  . ezetimibe  10 mg Oral Daily  . pantoprazole  40 mg Oral BID AC   Continuous Infusions: . sodium bicarbonate 150 mEq in dextrose 5% 1000 mL 100 mL/hr at 06/03/20 2253    Principal Problem:   Acute renal failure superimposed on stage 3 chronic kidney disease (HCC) Active Problems:   Chronic systolic CHF (congestive heart failure) (HCC)   CAD (coronary artery disease)   Lung nodule   Enteritis   ARF (acute renal failure) (HCC)   Heme positive stool   Acute blood loss anemia   Acute gastric ulcer without hemorrhage or perforation   Duodenal ulcer disease   Barrett's esophagus with dysplasia   Barrett's esophagus without dysplasia   Hiatal hernia   LOS: 3 days   A & P  Acute on chronic kidney disease stage III: Improved with decrease in creatinine from 2.37 to 1.65 with IV fluids. Elevation in creatinine was precipitated by patient's diarrhea and  Vomiting through volume depletion.  Patient also takes ARB which could have further worsen.  Will hold off ARB patient received fluids in the ER. Monitor creatinine, electrolytes, and volume status. Avoid nephrotoxins and hypotensions.  Diarrhea and vomiting: Resolved. This presentation could be from recent use of antibiotics but patient also has history of  multiple midgut volvulus CT does not show any signs of that now.  Patient also was recently treated for SBO. Stool was found to be C Diff negative, but FOBT positive.  Acute blood loss anemia: Due to upper GI bleed. Drop in hemoglobin from 13.3 on presentation on 05/31/2020 to 8.3 this morning. FOBT positive. Monitor hemoglobin and transfuse for hemoglobin less than 7.0. Monitor.  GI bleed: demonstrated Barrett's esophagus, erosive gastritis with (2)  shallow ulcerations in the stomach, and 5 non-bleeding duodenal ulcers and duodenitis with no stigmata of bleeding . The second part of the duodenum was normal. The patietn's diet will be advanced as tolerated. He will be continued on BID PPI x 8 weeks then dropping down to 1 PO daily. The patient is to avoid ASA, NSAIDS, although low dose  ASA is acceptable. He is to follow up with Dr. Havery Moros at discharge.  Pneumonia presently on antibiotics.  Hypocalcemia: Correected Calcium 8.11. Below normal level of 8.9. Patient given igm of Calcium gluconate IV.   Vitamin D Deficiency: Vitamin D 1,25 found to be 7.15, below low normal level of 30. Will supplement.  CAD status post stenting denies chest pain.  On aspirin and Zetia.  History of CHF presently holding of ARB due to acute renal failure.  Hold of any diuretics.  History of CML on Bosulif.  Since patient has multiple comorbidities with acute on chronic kidney disease will need close monitoring for any further worsening in inpatient status.  DVT prophylaxis: SCDs for now until you make sure there is no significant fall in hemoglobin. Code Status: Full code. Family Communication: Discussed with patient. Disposition Plan: The patient is from home. Anticipate discharge to home. Barriers to discharge: need to monitor hemoglobin for stability. Status is: Inpatient  Remains inpatient appropriate because:Hemodynamically unstable and Inpatient level of care appropriate due to severity of  illness   Dispo: The patient is from: Home              Anticipated d/c is to: Home              Anticipated d/c date is: 1 day              Patient currently is not medically stable to d/c.   Sircharles Holzheimer, DO [facility] Direct contact: see [email or web address]  7PM-7AM contact night coverage as above 06/04/2020, 4:58 PM  LOS: 1 day

## 2020-06-04 NOTE — Progress Notes (Signed)
[facility] Progress Note  CC:  [name]  Subjective:  Feels great.  No complaints today.  Has not had anymore BMs.  EGD 7/15 showed the following:  - Esophageal mucosal changes consistent with long-segment Barrett's esophagus. Biopsied. - 2 cm hiatal hernia. - Acute gastritis with 2 small gastric ulcers and scattered erosions. Biopsied to exclude H. Pylori. - Non-bleeding duodenal ulcers and duodenitis with no stigmata of bleeding. - Normal second portion of the duodenum.  Objective:  Vital signs in last 24 hours: Temp:  [97.7 F (36.5 C)-100.5 F (38.1 C)] 98.1 F (36.7 C) (07/16 0659) Pulse Rate:  [60-72] 60 (07/16 0659) Resp:  [16] 16 (07/16 0659) BP: (108-117)/(50-64) 113/55 (07/16 0659) SpO2:  [91 %-99 %] 99 % (07/16 0659) Last BM Date: 06/03/20 General:  Alert, in NAD Heart:  Regular rate and rhythm; no murmurs Pulm:  CTAB.  No increased WOB. Abdomen:  Soft, non-distended.  BS present.  Non-tender. Extremities:  Without edema. Neurologic:  Alert and oriented x 4;  grossly normal neurologically. Psych:  Alert and cooperative. Normal mood and affect.  Intake/Output from previous day: 07/15 0701 - 07/16 0700 In: 826.3 [P.O.:460; I.V.:366.3] Out: 550 [Urine:550] Intake/Output this shift: Total I/O In: -  Out: 375 [Urine:375]  Lab Results: Recent Labs    06/03/20 0531 06/03/20 1105 06/04/20 0456  WBC 12.3* 13.5* 13.8*  HGB 7.6* 9.0* 8.0*  HCT 23.8* 28.4* 25.3*  PLT 233 285 244   BMET Recent Labs    06/03/20 0531 06/04/20 0456  NA 137 132*  K 3.5 4.2  CL 98 95*  CO2 29 28  GLUCOSE 127* 122*  BUN 16 14  CREATININE 1.65* 1.66*  CALCIUM 6.3* 6.4*   LFT Recent Labs    06/04/20 0456  PROT 5.3*  ALBUMIN 1.8*  AST 12*  ALT 11  ALKPHOS 116  BILITOT 0.5   DG Chest 2 View  Result Date: 06/03/2020 CLINICAL DATA:  Hypoxia EXAM: CHEST - 2 VIEW COMPARISON:  05/31/2020 CT. 05/27/2020 chest radiograph from [place]. FINDINGS: Mild hyperinflation. Patient rotated minimally right. Normal heart size. Atherosclerosis in the transverse aorta. No pleural effusion or pneumothorax. Diffuse interstitial thickening. Left greater than right upper lobe airspace disease is slightly increased. Right lower lobe airspace disease is similar. IMPRESSION: Left greater than right airspace disease, overall progressive. Favor pneumonia. Underlying interstitial thickening likely relates to emphysema/chronic bronchitis. Aortic Atherosclerosis (ICD10-I70.0). Electronically Signed   By: Abigail Miyamoto M.D.   On: 06/03/2020 16:28   Assessment / Plan: # Heme positive, dark stools / normocytic Anemia --on daily baby asa --Started oral iron a week ago (Oncology started), stools dark since. --baseline hgb mid 11 range. Probably hemoconcentrated on admission with hgb of 13.  Hgb 9.7 after IV fluids but has continued to slowly declineand down to 8.4.  It has bounced around a bit and is 8 grams today.  No BM or sign of bleeding. --EGD 7/15 showed the following:    - Esophageal mucosal changes consistent with long-segment Barrett's esophagus. Biopsied.    - 2 cm hiatal hernia.    - Acute gastritis with 2 small gastric ulcers and scattered erosions. Biopsied to exclude H. Pylori.    - Non-bleeding duodenal ulcers and duodenitis with no stigmata of bleeding.    - Normal second portion of the duodenum. --Continue BID PPI x 8 weeks and then daily thereafter.  # Nausea, vomiting, abdominal pain, acute on chronic diarrhea, fever/ AKI /leukocytosis. --  Non-contrast CT scan remarkable for several proximal loops of fluid-filled marginally dilated loopswithout transition point.Air / fluid levels in distal rectum and colon.  --Could have been infectious processgiven N/V/D and fever. Of note, the dilated small bowel on imaging maybe chronic (based on prior imaging) and due to surgical anatomy . --Leukocytosis could be secondary to PNA  vs gastroenteritis.WBC improving overall but up slightly today-24K >>12.3 >> 12.1>>13.8 --Nausea / vomiting / diarrhea and abdominal pain have resolved.  # Chronic diarrhea --Generally has responded to antibiotics for SIBO. At risk for SIBO given small bowel anatomy (resections and small bowel diverticulosis) --Last course of antibiotics in early June, no recurrent symptoms since  just one day of diarrhea, now resolved)  # AKIon CKD --Improving as of yesterday, Cr 3.1 >>2.37>>1.66  # Elevated alk phos --normal in 2019 --184 yesterday > 272.  Actually normal at 116 today. --AST / ALT / Bilirubin normal. --Elevation possibly from recent antibiotics ( ? Augmentin). However, in the body of the CT scan report there is mention of marked extrahepatic and moderate intrahepatic ductal dilation, progressed since last exam. He is post- cholecystectomy --May need MRCP when acute issues resolve.  Will discuss this at outpatient follow-up.   # PNA --on antibiotics.   # CML, in remission. --followed by Dr. [name]  **Follow-up office visit entered in discharge information.  GI signing off.   LOS: 3 days   [name] [name]. [name] [name]  06/04/2020, 12:37 PM

## 2020-06-04 NOTE — Progress Notes (Signed)
PHARMACY NOTE:  ANTIMICROBIAL RENAL DOSAGE ADJUSTMENT  Current antimicrobial regimen includes a mismatch between antimicrobial dosage and estimated renal function.  As per policy approved by the Pharmacy & Therapeutics and Medical Executive Committees, the antimicrobial dosage will be adjusted accordingly.  Current antimicrobial dosage:  Omnicef 300 mg PO BID   Indication: PNA  Renal Function:  Estimated Creatinine Clearance: 26.4 mL/min (A) (by C-G formula based on SCr of 1.66 mg/dL (H)). []      On intermittent HD, scheduled: []      On CRRT    Antimicrobial dosage has been changed to:  Omnicef 300 mg PO daily   Additional comments:   Thank you for allowing pharmacy to be a part of this patient's care.   Royetta Asal, PharmD, BCPS 06/04/2020 10:29 AM

## 2020-06-05 ENCOUNTER — Inpatient Hospital Stay (HOSPITAL_COMMUNITY): Payer: Medicare HMO

## 2020-06-05 DIAGNOSIS — D62 Acute posthemorrhagic anemia: Secondary | ICD-10-CM | POA: Diagnosis not present

## 2020-06-05 DIAGNOSIS — R112 Nausea with vomiting, unspecified: Secondary | ICD-10-CM | POA: Diagnosis not present

## 2020-06-05 DIAGNOSIS — R935 Abnormal findings on diagnostic imaging of other abdominal regions, including retroperitoneum: Secondary | ICD-10-CM | POA: Diagnosis not present

## 2020-06-05 DIAGNOSIS — D509 Iron deficiency anemia, unspecified: Secondary | ICD-10-CM | POA: Diagnosis not present

## 2020-06-05 LAB — CBC
HCT: 26.9 % — ABNORMAL LOW (ref 39.0–52.0)
Hemoglobin: 8.2 g/dL — ABNORMAL LOW (ref 13.0–17.0)
MCH: 28 pg (ref 26.0–34.0)
MCHC: 30.5 g/dL (ref 30.0–36.0)
MCV: 91.8 fL (ref 80.0–100.0)
Platelets: 246 10*3/uL (ref 150–400)
RBC: 2.93 MIL/uL — ABNORMAL LOW (ref 4.22–5.81)
RDW: 15.6 % — ABNORMAL HIGH (ref 11.5–15.5)
WBC: 15 10*3/uL — ABNORMAL HIGH (ref 4.0–10.5)
nRBC: 0 % (ref 0.0–0.2)

## 2020-06-05 LAB — CULTURE, BLOOD (ROUTINE X 2)
Culture: NO GROWTH
Culture: NO GROWTH
Special Requests: ADEQUATE

## 2020-06-05 LAB — COMPREHENSIVE METABOLIC PANEL
ALT: 11 U/L (ref 0–44)
AST: 14 U/L — ABNORMAL LOW (ref 15–41)
Albumin: 1.9 g/dL — ABNORMAL LOW (ref 3.5–5.0)
Alkaline Phosphatase: 100 U/L (ref 38–126)
Anion gap: 10 (ref 5–15)
BUN: 12 mg/dL (ref 8–23)
CO2: 28 mmol/L (ref 22–32)
Calcium: 6.6 mg/dL — ABNORMAL LOW (ref 8.9–10.3)
Chloride: 93 mmol/L — ABNORMAL LOW (ref 98–111)
Creatinine, Ser: 1.67 mg/dL — ABNORMAL HIGH (ref 0.61–1.24)
GFR calc Af Amer: 42 mL/min — ABNORMAL LOW (ref >60)
GFR calc non Af Amer: 36 mL/min — ABNORMAL LOW (ref >60)
Glucose, Bld: 116 mg/dL — ABNORMAL HIGH (ref 70–99)
Potassium: 4.3 mmol/L (ref 3.5–5.1)
Sodium: 131 mmol/L — ABNORMAL LOW (ref 135–145)
Total Bilirubin: 0.6 mg/dL (ref 0.3–1.2)
Total Protein: 5.5 g/dL — ABNORMAL LOW (ref 6.5–8.1)

## 2020-06-05 IMAGING — DX DG CHEST 1V PORT
1 series · 1 of 1 positions shown · non-contrast
Comparison: [DATE] and chest CT [DATE]

CLINICAL DATA: Shortness of breath.

EXAM:
PORTABLE CHEST 1 VIEW

[chest ap]
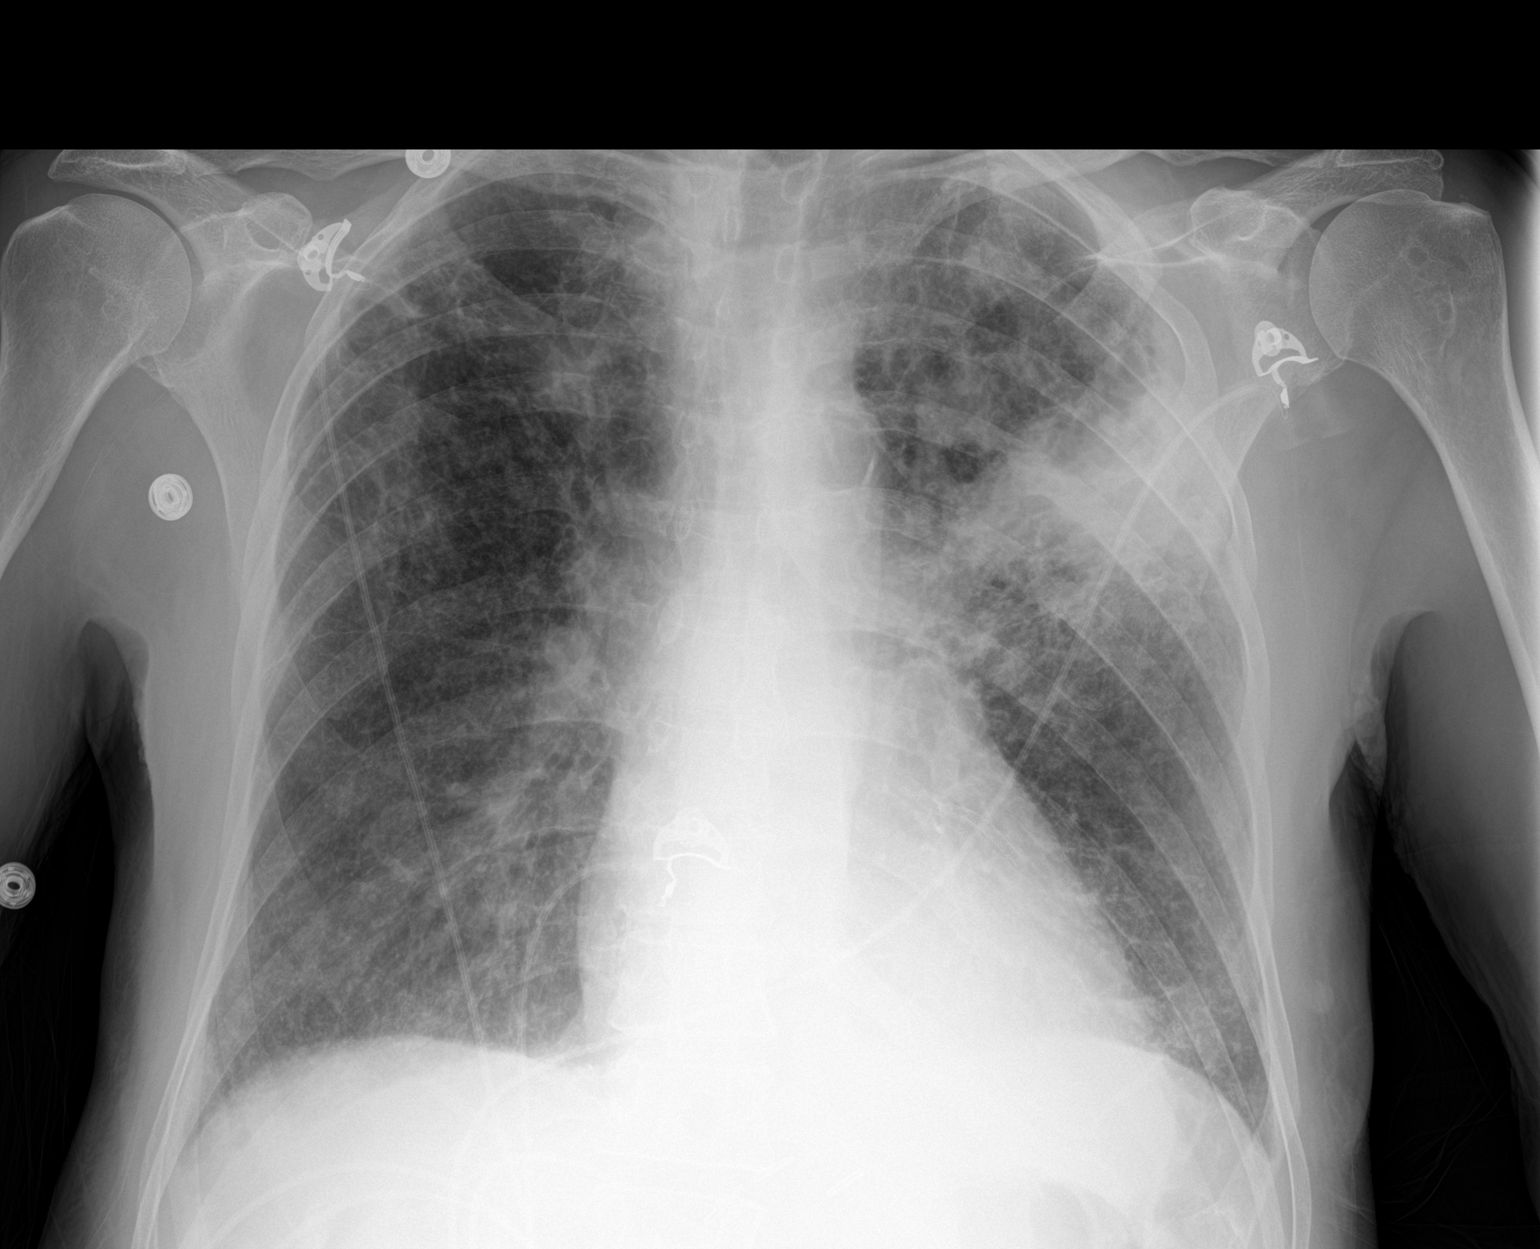

[1 of 1 positions shown; findings below may reference images not displayed]

FINDINGS: Increased densities in the left upper lung compatible with worsening
airspace disease in this region. Patchy densities in the right upper
lung have minimally changed. Heart size is within normal limits and
stable. Trachea is midline. Concern for slightly increased densities
at the medial left lung base.
IMPRESSION: Increased opacification in the left upper lung is suggestive for
increased consolidation and worsening pneumonia.

Patchy densities in the right upper lung have minimally changed.

Slightly increased densities at the medial left lung base could
represent developing disease in this area. Recommend attention on
follow-up.

## 2020-06-05 MED ORDER — SODIUM CHLORIDE 0.9 % IV SOLN
2.0000 g | INTRAVENOUS | Status: DC
  Administered 2020-06-05 – 2020-06-08 (×4): 2 g via INTRAVENOUS
  Filled 2020-06-05 (×5): qty 2

## 2020-06-05 MED ORDER — VANCOMYCIN HCL 500 MG/100ML IV SOLN
500.0000 mg | INTRAVENOUS | Status: DC
  Administered 2020-06-05 – 2020-06-08 (×4): 500 mg via INTRAVENOUS
  Filled 2020-06-05 (×4): qty 100

## 2020-06-05 NOTE — Progress Notes (Signed)
PROGRESS NOTE  [name] [name] ELT:[id number] DOB: 1930/07/12 DOA: 05/31/2020 PCP: Drake Leach, MD  Brief History   HWY:SHUOH Mooreis a 86 y.o.malewithhistory of CAD status post stenting, CHF, CML, chronic kidney disease stage III who has 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 After 4 days patient again had to go to his primary care because symptoms did not improve and [name] was added to his amoxicillin. On "Sunday there is 48 hours ago patient had multiple episodes of diarrhea at least 10 episodes with some vomiting. Denies any abdominal pain. He took Imodium yesterday morning following which his diarrhea stopped but after about 8 hours later he started having diarrhea again. At this point he decided to come to the ER. Denies chest pain or shortness of breath. ED Course:In the ER patient had blood work done which showed marked leukocytosis of 23,000 creatinine worsened from 1.6-3.12 CT chest abdomen pelvis was done which shows bilateral upper lobe infiltrates. CT abdomen was largely unremarkable except for small abdominal aneurysm. Patient was started on empiric antibiotics and admitted for further management of acute renal failure with diarrhea. Stool for occult blood was positive but patient did not see any obvious bleeding. As patient's hemoglobin has dropped from 13.3 to 8.3, GI was consulted. They plan on doing EGD later today.  EGD demonstrated Barrett's esophagus, erosive gastritis with (2)  shallow ulcerations in the stomach, and 5 non-bleeding duodenal ulcers and duodenitis with no stigmata of bleeding . The second part of the duodenum was normal.   The patient's pneumonia was treated with IV Cefepime. When the patient had been afebrile and had not required oxygen for  more than 24 hours, he was converted to oral antibiotics. The night after this change was made, the patient again became febrile and hypoxic. CXR demonstrated increased densities at the medial left lung base and patchy densities in the right upper lung, and increased opacification in the left upper lung suggesting worsening pneumonia. WBC is increased. He has been returned to IV antibiotics.  GI has recommended continuing PPI x 8 weeks followed by daily PPI with follow up with [name] as outpatient both to follow GI bleed and to have MRCP/MRI as outpatient. They have signed off. Consultants  . Gastroenterology  Procedures  . EGD  Antibiotics   Anti-infectives (From admission, onward)   Start     Dose/Rate Route Frequency Ordered Stop   06/05/20 1200  ceFEPIme (MAXIPIME) 2 g in sodium chloride 0.9 % 100 mL IVPB     Discontinue     2 g 200 mL/hr over 30 Minutes Intravenous Every 24 hours 06/05/20 0957     06/04/20 1200  cefdinir (OMNICEF) capsule 300 mg  Status:  Discontinued        300 mg Oral Daily 06/04/20 1031 06/05/20 0948   06/04/20 1045  cefdinir (OMNICEF) capsule 300 mg  Status:  Discontinued        30" 0 mg Oral Every 12 hours 06/04/20 0957 06/04/20 1031   06/01/20 1830  ceFEPIme (MAXIPIME) 2 g in sodium chloride 0.9 % 100 mL IVPB  Status:  Discontinued        2 g 200 mL/hr over 30 Minutes Intravenous Every 24 hours 05/31/20 1831 06/04/20 0957   05/31/20 1830  vancomycin variable dose per unstable renal  function (pharmacist dosing)  Status:  Discontinued         Does not apply See admin instructions 05/31/20 1831 06/02/20 1348   05/31/20 1815  ceFEPIme (MAXIPIME) 2 g in sodium chloride 0.9 % 100 mL IVPB        2 g 200 mL/hr over 30 Minutes Intravenous  Once 05/31/20 1801 05/31/20 1919   05/31/20 1815  metroNIDAZOLE (FLAGYL) IVPB 500 mg        500 mg 100 mL/hr over 60 Minutes Intravenous  Once 05/31/20 1801 05/31/20 2253   05/31/20 1815  vancomycin (VANCOCIN) IVPB 1000  mg/200 mL premix        1,000 mg 200 mL/hr over 60 Minutes Intravenous  Once 05/31/20 1809 06/01/20 0001      Subjective  The patient very nearly falls as he is coming back from the bathroom. His SaO2 on room air after those few steps was 83%. With 2 liters after he sat down it came up to 96%. Yesterday the patient's saturations were in the 90's on room air.  Objective   Vitals:  Vitals:   06/05/20 0936 06/05/20 1330  BP: 129/62 (!) 102/50  Pulse: 77 (!) 54  Resp: 18 14  Temp:  98 F (36.7 C)  SpO2: (!) 89% 95%   Exam:  Constitutional:  . The patient is awake, alert, and oriented x 3. No acute distress.  Respiratory:  . No increased work of breathing. . No wheezes, rales, or rhonchi . No tactile fremitus . Diminished breath sounds bilaterally. Cardiovascular:  . Regular rate and rhythm . No murmurs, ectopy, or gallups. . No lateral PMI. No thrills. Abdomen:  . Abdomen is soft, non-tender, non-distended . No hernias, masses, or organomegaly . Normoactive bowel sounds.  Musculoskeletal:  . No cyanosis, clubbing, or edema Skin:  . No rashes, lesions, ulcers . palpation of skin: no induration or nodules Neurologic:  . CN 2-12 intact . Sensation all 4 extremities intact Psychiatric:  . Mental status o Mood, affect appropriate o Orientation to person, place, time  . judgment and insight appear intact  I have personally reviewed the following:   Today's Data  . Vitals, CBC, CMP  Micro Data  . Blood cultures x 2 - No growth . MRSA by PCR - negative  Scheduled Meds: . carvedilol  12.5 mg Oral BID WC  . cholecalciferol  400 Units Oral Daily  . ezetimibe  10 mg Oral Daily  . pantoprazole  40 mg Oral BID AC   Continuous Infusions: . ceFEPime (MAXIPIME) IV 2 g (06/05/20 1348)  . sodium bicarbonate 150 mEq in dextrose 5% 1000 mL 150 mEq (06/05/20 0341)    Principal Problem:   Acute renal failure superimposed on stage 3 chronic kidney disease (HCC) Active  Problems:   Chronic systolic CHF (congestive heart failure) (HCC)   CAD (coronary artery disease)   Lung nodule   Enteritis   ARF (acute renal failure) (HCC)   Heme positive stool   Acute blood loss anemia   Acute gastric ulcer without hemorrhage or perforation   Duodenal ulcer disease   Barrett's esophagus with dysplasia   Barrett's esophagus without dysplasia   Hiatal hernia   LOS: 4 days   A & P  Acute on chronic kidney disease stage III: Improved with decrease in creatinine from 2.37 to 1.65 with IV fluids. Elevation in creatinine was precipitated by patient's diarrhea and  Vomiting through volume depletion.  Patient also takes ARB which could have further  worsen.  Will hold off ARB patient received fluids in the ER. Monitor creatinine, electrolytes, and volume status. Avoid nephrotoxins and hypotensions.  Pneumonia was initially placed on IV Vancomycin and IV cefepime. The antibiotics were narrowed when PCR for MRSA came back negative. The patient's pneumonia was treated with 5 days of IV Cefepime . When the patient had been afebrile and had not required oxygen for more than 24 hours, he was converted to oral antibiotics. The night after this change was made, the patient again became febrile and hypoxic. CXR demonstrated increased densities at the medial left lung base and patchy densities in the right upper lung, and increased opacification in the left upper lung suggesting worsening pneumonia. WBC is increased. He has been returned to IV antibiotics. Will add back Vancomycin.  Diarrhea and vomiting: Resolved. This presentation could be from recent use of antibiotics but patient also has history of multiple midgut volvulus CT does not show any signs of that now.  Patient also was recently treated for SBO. Stool was found to be C Diff negative, but FOBT positive.  Acute blood loss anemia: Due to upper GI bleed. Drop in hemoglobin from 13.3 on presentation on 05/31/2020 to 8.2 this morning.  FOBT positive. Monitor hemoglobin and transfuse for hemoglobin less than 7.0. Monitor.  GI bleed: demonstrated Barrett's esophagus, erosive gastritis with (2)  shallow ulcerations in the stomach, and 5 non-bleeding duodenal ulcers and duodenitis with no stigmata of bleeding . The second part of the duodenum was normal. The patietn's diet will be advanced as tolerated. He will be continued on BID PPI x 8 weeks then dropping down to 1 PO daily. The patient is to avoid ASA, NSAIDS, although low dose ASA is acceptable. He is to follow up with Dr. Havery Moros at discharge.  Hypocalcemia: Correected Calcium 8.11. Below normal level of 8.9. Patient given igm of Calcium gluconate IV. Monitor.  Vitamin D Deficiency: Vitamin D 1,25 found to be 7.15, below low normal level of 30. Will supplement.  CAD status post stenting denies chest pain.  On aspirin and Zetia.  History of CHF presently holding of ARB due to acute renal failure.  Hold of any diuretics.  History of CML on Bosulif.  Since patient has multiple comorbidities with acute on chronic kidney disease will need close monitoring for any further worsening in inpatient status.  DVT prophylaxis: SCDs for now until you make sure there is no significant fall in hemoglobin. Code Status: Full code. Family Communication: Discussed with patient. Disposition Plan: The patient is from home. Anticipate discharge to home. Barriers to discharge: need to monitor hemoglobin for stability. Status is: Inpatient  Remains inpatient appropriate because:Hemodynamically unstable and Inpatient level of care appropriate due to severity of illness   Dispo: The patient is from: Home              Anticipated d/c is to: Home              Anticipated d/c date is: 1 day              Patient currently is not medically stable to d/c.   Susy Placzek, DO [facility] Direct contact: see [email or web address]  7PM-7AM contact night coverage as above 06/05/2020, 3:08 PM  LOS:  1 day

## 2020-06-05 NOTE — Plan of Care (Signed)
  Problem: Activity: Goal: Risk for activity intolerance will decrease Outcome: Progressing   

## 2020-06-05 NOTE — Evaluation (Signed)
Occupational Therapy Evaluation Patient Details Name: [name] [name] MRN: [id number] DOB: [birth year] Today's Date: 06/05/2020    History of Present Illness 86yo male with ongoing fever and diarrhea, nausea and vomiting. FOBT positive, C-diff negative. Admitted with acute renal failure, CKD stage III. CXR with bilateral upper lobe infiltrates. PMH CHF, CKD, CML, CAD, SBO, HLD, cardiomyopathy, bowel resection, cardiac cath   Clinical Impression   [name] [name] is an 86 year old man who presents with decreased activity tolerance with impaired cardiopulmonary endurance needing 2 liters of oxygen compared to his baseline. Patient's o2 sats dropped to 83% on room air with short ambulation of approx 15 feet. Patient demonstrates ability to perform ADLs with supervision and exhibits mild imbalances - needing to hold onto furniture and wall to steady himself. Patient will benefit from skilled OT services while in hospital in order to maintain functional abilities and improve endurance in order to return home at discharge.    Follow Up Recommendations  No OT follow up    Equipment Recommendations  None recommended by OT    Recommendations for Other Services       Precautions / Restrictions Precautions Precautions: Fall Precaution Comments: monitor 02 sats      Mobility Bed Mobility Overal bed mobility: Needs Assistance Bed Mobility: Supine to Sit;Sit to Supine     Supine to sit: Supervision        Transfers Overall transfer level: Needs assistance Equipment used: None Transfers: Sit to/from Stand Sit to Stand: Min guard         General transfer comment: Min guard to ambualte in room. Patinet using one hand to steady himself. he reports he walks better in shoes and also reports neuropathy in feet.    Balance Overall balance assessment: Mild deficits observed, not formally tested   Sitting balance-Leahy Scale: Good       Standing balance-Leahy Scale: Fair Standing balance  comment: able to ambulate with random use of upper extremity. Able to tolerate 2/3 anterior nudges.                           ADL either performed or assessed with clinical judgement   ADL Overall ADL's : Independent                                       General ADL Comments: Independent to perform ADLs. Supervision provided secondary to balance impairments.     Vision   Vision Assessment?: No apparent visual deficits     Perception     Praxis      Pertinent Vitals/Pain Pain Assessment: No/denies pain     Hand Dominance     Extremity/Trunk Assessment     Lower Extremity Assessment Lower Extremity Assessment: Defer to PT evaluation   Cervical / Trunk Assessment Cervical / Trunk Assessment: Normal   Communication Communication Communication: HOH   Cognition Arousal/Alertness: Awake/alert Behavior During Therapy: WFL for tasks assessed/performed Overall Cognitive Status: Within Functional Limits for tasks assessed                                     General Comments       Exercises     Shoulder Instructions      Home Living Family/patient expects to be discharged to:: Private residence  Living Arrangements: Alone Available Help at Discharge: Family;Available PRN/intermittently (family lives about 5 minutes away and can check on him easily- has 2 children in [place], 1 in [place], 1 in [place]) Type of Home: House Home Access: Stairs to enter [employer] of Steps: 3 Entrance Stairs-Rails: Right Home Layout: Able to live on main level with bedroom/bathroom;Multi-level Alternate Level Stairs-Number of Steps: flight Alternate Level Stairs-Rails: Right Bathroom Shower/Tub: Tub/shower unit;Walk-in shower (has both tub/shower and walk-in, mostly uses walk-in shower)   Bathroom Toilet: Standard Bathroom Accessibility: Yes   Home Equipment: Environmental consultant - 2 wheels;Walker - 4 wheels;Shower seat;Bedside commode           Prior Functioning/Environment Level of Independence: Independent                 OT Problem List: Cardiopulmonary status limiting activity;Decreased activity tolerance;Impaired balance (sitting and/or standing)      OT Treatment/Interventions: Self-care/ADL training;Therapeutic exercise;DME and/or AE instruction;Energy conservation;Therapeutic activities;Balance training;Patient/family education    OT Goals(Current goals can be found in the care plan section) Acute Rehab OT Goals Patient Stated Goal: To improve endurance OT Goal Formulation: With patient Time For Goal Achievement: 06/19/20 Potential to Achieve Goals: Good  OT Frequency: Min 2X/week   Barriers to D/C:            Co-evaluation              AM-PAC OT "6 Clicks" Daily Activity     Outcome Measure Help from another person eating meals?: None Help from another person taking care of personal grooming?: None Help from another person toileting, which includes using toliet, bedpan, or urinal?: None Help from another person bathing (including washing, rinsing, drying)?: None Help from another person to put on and taking off regular upper body clothing?: None Help from another person to put on and taking off regular lower body clothing?: None 6 Click Score: 24   End of Session Equipment Utilized During Treatment: Gait belt Nurse Communication: Mobility status  Activity Tolerance: Patient tolerated treatment well Patient left: in bed;with call bell/phone within reach  OT Visit Diagnosis: Muscle weakness (generalized) (M62.81)                Time: 1638-4665 OT Time Calculation (min): 15 min Charges:  OT General Charges $OT Visit: 1 Visit OT Evaluation $OT Eval Low Complexity: 1 Low  Rashawn Rolon, OTR/L [place]  Office [phone] Pager: [phone]   Lenward Chancellor 06/05/2020, 3:51 PM

## 2020-06-05 NOTE — Progress Notes (Signed)
SATURATION QUALIFICATIONS: (This note is used to comply with regulatory documentation for home oxygen)  Patient Saturations on Room Air at Rest = 88 %  Patient Saturations on Room Air while Ambulating =  86%  Patient Saturations on 2 Liters of oxygen while Ambulating =  96%  Please briefly explain why patient needs home oxygen: Patient denied having any worsening shortness of breath. Pt reports usually having some discomfort with breathing. Pt 88% on room air. Oxygen applied at 2 liters, saturation improved 86%

## 2020-06-05 NOTE — Evaluation (Signed)
Physical Therapy Evaluation Patient Details Name: [name] [name] MRN: [id number] DOB: November 04, 1930 Today's Date: 06/05/2020   History of Present Illness  86yo male with ongoing fever and diarrhea, nausea and vomiting. FOBT positive, C-diff negative. Admitted with acute renal failure, CKD stage III. CXR with bilateral upper lobe infiltrates. PMH CHF, CKD, CML, CAD, SBO, HLD, cardiomyopathy, bowel resection, cardiac cath  Clinical Impression   Patient received up in chair, very pleasant with PT however seems to have poor insight into deficits- has been arguing with nursing staff/MD about need for O2 and antibiotics, and per MD was seen tripping in his room while walking to bathroom. See below for mobility/physical assist levels. Grossly unsteady when ambulating with no device, tends to drift R/L and "surfs" on railings on walls and furniture available to him, even so needed consistent MinA to maintain balance and with multiple occasions of reduced toe clearance/almost tripping during gait in hallway with no acknowledgement by patient other than "its hard to walk on these hard floors without shoes, that's why I'm unsteady". SpO2 no lower than 94% on 2LPM O2 with gait in hallway. Left sitting up in chair with all needs met, chair alarm active. Recommend follow-up with skilled OP PT services once medically cleared for DC by MD.     Follow Up Recommendations Outpatient PT    Equipment Recommendations  None recommended by PT (well equipped)    Recommendations for Other Services       Precautions / Restrictions Precautions Precautions: Fall;Other (comment) Precaution Comments: watch O2 Restrictions Weight Bearing Restrictions: No      Mobility  Bed Mobility               General bed mobility comments: OOB in recliner  Transfers Overall transfer level: Needs assistance Equipment used: None Transfers: Sit to/from Stand Sit to Stand: Min guard         General transfer comment: min  guard for safety, also assist for line management but no physical assist given to boost to standing  Ambulation/Gait Ambulation/Gait assistance: Min assist Gait Distance (Feet): 160 Feet Assistive device: None Gait Pattern/deviations: Step-through pattern;Decreased dorsiflexion - right;Decreased dorsiflexion - left;Trendelenburg;Drifts right/left;Narrow base of support Gait velocity: decreased   General Gait Details: needed fairly consistent MinA to maintain balance during gait- frequently reaches for railings and objects in the hallway and with narrow BOS causing him to drift R/L  Stairs            Wheelchair Mobility    Modified Rankin (Stroke Patients Only)       Balance Overall balance assessment: Needs assistance Sitting-balance support: No upper extremity supported;Feet supported Sitting balance-Leahy Scale: Good     Standing balance support: No upper extremity supported;During functional activity Standing balance-Leahy Scale: Poor Standing balance comment: MinA to maintain balance during ambulation with no device                             Pertinent Vitals/Pain Pain Assessment: No/denies pain    Home Living Family/patient expects to be discharged to:: Private residence Living Arrangements: Alone Available Help at Discharge: Family;Available PRN/intermittently (family lives about 5 minutes away and can check on him easily- has 2 children in [place], 1 in [place], 1 in [place]) Type of Home: House Home Access: Stairs to enter Entrance Stairs-Rails: Right Entrance Stairs-Number of Steps: 3 Home Layout: Able to live on main level with bedroom/bathroom;Multi-level Home Equipment: Walker - 2 wheels;Walker - 4 wheels;Shower seat;Bedside  commode      Prior Function Level of Independence: Independent               Hand Dominance        Extremity/Trunk Assessment   Upper Extremity Assessment Upper Extremity Assessment: Defer to OT evaluation     Lower Extremity Assessment Lower Extremity Assessment: Generalized weakness    Cervical / Trunk Assessment Cervical / Trunk Assessment: Normal  Communication   Communication: HOH  Cognition Arousal/Alertness: Awake/alert Behavior During Therapy: WFL for tasks assessed/performed Overall Cognitive Status: Within Functional Limits for tasks assessed                                        General Comments General comments (skin integrity, edema, etc.): SpO2 no lower than 94% on 2LPM O2 per [place]; did not check gait on RA as RN and NT had already attempted and reported consistent desats into mid-low 80s    Exercises     Assessment/Plan    PT Assessment Patient needs continued PT services  PT Problem List Decreased strength;Decreased knowledge of use of DME;Decreased activity tolerance;Decreased safety awareness;Decreased balance;Decreased mobility;Decreased coordination       PT Treatment Interventions DME instruction;Balance training;Gait training;Stair training;Cognitive remediation;Functional mobility training;Patient/family education;Therapeutic activities;Therapeutic exercise    PT Goals (Current goals can be found in the Care Plan section)  Acute Rehab PT Goals Patient Stated Goal: go home asap PT Goal Formulation: With patient Time For Goal Achievement: 06/19/20 Potential to Achieve Goals: Good    Frequency Min 3X/week   Barriers to discharge        Co-evaluation               AM-PAC PT "6 Clicks" Mobility  Outcome Measure Help needed turning from your back to your side while in a flat bed without using bedrails?: A Little Help needed moving from lying on your back to sitting on the side of a flat bed without using bedrails?: A Little Help needed moving to and from a bed to a chair (including a wheelchair)?: A Little Help needed standing up from a chair using your arms (e.g., wheelchair or bedside chair)?: A Little Help needed to walk in  hospital room?: A Little Help needed climbing 3-5 steps with a railing? : A Little 6 Click Score: 18    End of Session Equipment Utilized During Treatment: Gait belt;Oxygen Activity Tolerance: Patient tolerated treatment well Patient left: in chair;with call bell/phone within reach;with chair alarm set Nurse Communication: Mobility status PT Visit Diagnosis: Unsteadiness on feet (R26.81);Muscle weakness (generalized) (M62.81)    Time: 3546-5681 PT Time Calculation (min) (ACUTE ONLY): 19 min   Charges:   PT Evaluation $PT Eval Moderate Complexity: 1 Mod          Windell Norfolk, DPT, PN1   Supplemental Physical Therapist [place]    Pager [phone] Acute Rehab Office [phone]

## 2020-06-05 NOTE — Progress Notes (Addendum)
Pharmacy Antibiotic Note  [name] [name] is a 86 y.o. 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 Chest CTA on 7/12 showed "superimposed bilateral upper lobe pneumonic infiltrate".  Cefepime just changed to omnicef on 7/16 but now changing back to cefepime per Md order  Today, 06/05/2020: D5 total abx WBC 15 up SCr 1.67 unchanged CrCl 26 Tmax 100.3   Plan: Restart cefepime 2g IV q24h Monitor renal function closely and adjust dose if/when appropriate F/u cultures  Addendum: Md also restarting vancomycin - based on current weight and renal function, will start vancomycin 500mg  IV q24 - goal trough 15-20 mcg/mL  _____________________________________  Height: 5\' 9"  (175.3 cm) Weight: 59.6 kg (131 lb 6.4 oz) IBW/kg (Calculated) : 70.7  Temp (24hrs), Avg:98.9 F (37.2 C), Min:97.2 F (36.2 C), Max:100.3 F (37.9 C)  Recent Labs  Lab 05/31/20 1402 05/31/20 1402 05/31/20 1617 06/01/20 0609 06/01/20 1521 06/02/20 1505 06/03/20 0531 06/03/20 1105 06/04/20 0456 06/05/20 0554  WBC 23.8*   < >  --  12.3*   < > 12.2* 12.3* 13.5* 13.8* 15.0*  CREATININE 3.12*  --   --  2.37*  --   --  1.65*  --  1.66* 1.67*  LATICACIDVEN  --   --  1.1 0.7  --   --  0.8 1.1  --   --    < > = values in this interval not displayed.    Estimated Creatinine Clearance: 26.3 mL/min (A) (by C-G formula based on SCr of 1.67 mg/dL (H)).    Allergies  Allergen Reactions  . Crestor [Rosuvastatin] Itching  . Percocet [Oxycodone-Acetaminophen] Itching  . Spironolactone Other (See Comments)    Nipples sore and swelling   . Metronidazole Other (See Comments)    Insomnia and nervousness    Thank you for allowing pharmacy to be a part of this patient's care.  Kara Mead 06/05/2020 9:55 AM

## 2020-06-06 DIAGNOSIS — D509 Iron deficiency anemia, unspecified: Secondary | ICD-10-CM | POA: Diagnosis not present

## 2020-06-06 DIAGNOSIS — D62 Acute posthemorrhagic anemia: Secondary | ICD-10-CM | POA: Diagnosis not present

## 2020-06-06 DIAGNOSIS — R935 Abnormal findings on diagnostic imaging of other abdominal regions, including retroperitoneum: Secondary | ICD-10-CM | POA: Diagnosis not present

## 2020-06-06 DIAGNOSIS — R112 Nausea with vomiting, unspecified: Secondary | ICD-10-CM | POA: Diagnosis not present

## 2020-06-06 LAB — BASIC METABOLIC PANEL
Anion gap: 11 (ref 5–15)
BUN: 16 mg/dL (ref 8–23)
CO2: 29 mmol/L (ref 22–32)
Calcium: 7 mg/dL — ABNORMAL LOW (ref 8.9–10.3)
Chloride: 94 mmol/L — ABNORMAL LOW (ref 98–111)
Creatinine, Ser: 1.66 mg/dL — ABNORMAL HIGH (ref 0.61–1.24)
GFR calc Af Amer: 42 mL/min — ABNORMAL LOW (ref >60)
GFR calc non Af Amer: 37 mL/min — ABNORMAL LOW (ref >60)
Glucose, Bld: 107 mg/dL — ABNORMAL HIGH (ref 70–99)
Potassium: 4.6 mmol/L (ref 3.5–5.1)
Sodium: 134 mmol/L — ABNORMAL LOW (ref 135–145)

## 2020-06-06 LAB — CBC
HCT: 28.6 % — ABNORMAL LOW (ref 39.0–52.0)
Hemoglobin: 8.7 g/dL — ABNORMAL LOW (ref 13.0–17.0)
MCH: 28.1 pg (ref 26.0–34.0)
MCHC: 30.4 g/dL (ref 30.0–36.0)
MCV: 92.3 fL (ref 80.0–100.0)
Platelets: 334 10*3/uL (ref 150–400)
RBC: 3.1 MIL/uL — ABNORMAL LOW (ref 4.22–5.81)
RDW: 15.5 % (ref 11.5–15.5)
WBC: 15 10*3/uL — ABNORMAL HIGH (ref 4.0–10.5)
nRBC: 0 % (ref 0.0–0.2)

## 2020-06-06 MED ORDER — ALBUTEROL SULFATE (2.5 MG/3ML) 0.083% IN NEBU
2.5000 mg | INHALATION_SOLUTION | Freq: Four times a day (QID) | RESPIRATORY_TRACT | Status: DC | PRN
  Administered 2020-06-06 – 2020-06-07 (×5): 2.5 mg via RESPIRATORY_TRACT
  Filled 2020-06-06 (×5): qty 3

## 2020-06-06 MED ORDER — FUROSEMIDE 20 MG PO TABS
20.0000 mg | ORAL_TABLET | Freq: Every day | ORAL | Status: DC
  Administered 2020-06-09: 20 mg via ORAL
  Filled 2020-06-06 (×3): qty 1

## 2020-06-06 MED ORDER — CALCIUM GLUCONATE-NACL 1-0.675 GM/50ML-% IV SOLN
1.0000 g | Freq: Once | INTRAVENOUS | Status: AC
  Administered 2020-06-06: 1000 mg via INTRAVENOUS
  Filled 2020-06-06: qty 50

## 2020-06-06 MED ORDER — FUROSEMIDE 10 MG/ML IJ SOLN
20.0000 mg | Freq: Two times a day (BID) | INTRAMUSCULAR | Status: DC
  Administered 2020-06-06 – 2020-06-08 (×6): 20 mg via INTRAVENOUS
  Filled 2020-06-06 (×7): qty 2

## 2020-06-06 MED ORDER — GUAIFENESIN 100 MG/5ML PO SOLN: 5.0000 mL | ORAL | Status: DC | PRN

## 2020-06-06 NOTE — Progress Notes (Signed)
Verbal Order for PO lasix 20 mg on hold, IV dose administered this am.

## 2020-06-06 NOTE — Progress Notes (Signed)
Pt continues to c/o SOB and is requesting lasix since he feels like he has fluid building up around his heart. VSS. Notified Dr. Darene Lamer. Opyd. Per Dr. Myna Hidalgo pt has a severe PNA on CXR and the treatment is antibiotics and supportive care. Does not want to use Lasix unless he's in distress because he just recovered from kidney injury and already has low sodium and calcium. See new orders. Notified pt and will continue to monitor.

## 2020-06-06 NOTE — Progress Notes (Signed)
A consult was placed to IV Therapy for a new peripheral iv site;  In speaking with the patient, he voiced several concerns :  "my weight is up a lot since I came in;"  "I'm short of breath, and I don't use oxygen at home;"  Pt is very familiar with his meds that his cardiologist, Dr Ellender Hose, has had him on for years and is concerned that he isn't getting them.  RN updated on what the patient has told this Probation officer;

## 2020-06-06 NOTE — Progress Notes (Signed)
PROGRESS NOTE  [name] [name] LKJ:[id number] DOB: Apr 08, 1930 DOA: 05/31/2020 PCP: Drake Leach, MD  Brief History   [name] [name] a 86 y.o.malewithhistory of CAD status post stenting, CHF, CML, chronic kidney disease stage III who has 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 After 4 days patient again had to go to his primary care because symptoms did not improve and [name] was added to his amoxicillin. On "Sunday there is 48 hours ago patient had multiple episodes of diarrhea at least 10 episodes with some vomiting. Denies any abdominal pain. [name] [name] took Imodium yesterday morning following which his diarrhea stopped but after about 8 hours later [name] [name] started having diarrhea again. At this point [name] [name] decided to come to the ER. Denies chest pain or shortness of breath. ED Course:In the ER patient had blood work done which showed marked leukocytosis of 23,000 creatinine worsened from 1.6-3.12 CT chest abdomen pelvis was done which shows bilateral upper lobe infiltrates. CT abdomen was largely unremarkable except for small abdominal aneurysm. Patient was started on empiric antibiotics and admitted for further management of acute renal failure with diarrhea. Stool for occult blood was positive but patient did not see any obvious bleeding. As patient's hemoglobin has dropped from 13.3 to 8.3, GI was consulted. They plan on doing EGD later today.  EGD demonstrated Barrett's esophagus, erosive gastritis with (2)  shallow ulcerations in the stomach, and 5 non-bleeding duodenal ulcers and duodenitis with no stigmata of bleeding . The second part of the duodenum was normal.   The patient's pneumonia was treated with IV Cefepime. When the patient had been afebrile and had not required oxygen for  more than 24 hours, [name] [name] was converted to oral antibiotics. The night after this change was made, the patient again became febrile and hypoxic. CXR demonstrated increased densities at the medial left lung base and patchy densities in the right upper lung, and increased opacification in the left upper lung suggesting worsening pneumonia. WBC is increased. [name] [name] has been returned to IV antibiotics.  GI has recommended continuing PPI x 8 weeks followed by daily PPI with follow up with [name] as outpatient both to follow GI bleed and to have MRCP/MRI as outpatient. They have signed off. Consultants  . Gastroenterology  Procedures  . EGD  Antibiotics   Anti-infectives (From admission, onward)   Start     Dose/Rate Route Frequency Ordered Stop   06/05/20 1800  vancomycin (VANCOREADY) IVPB 500 mg/100 mL     Discontinue     500 mg 100 mL/hr over 60 Minutes Intravenous Every 24 hours 06/05/20 1512     06/05/20 1200  ceFEPIme (MAXIPIME) 2 g in sodium chloride 0.9 % 100 mL IVPB     Discontinue     2 g 200 mL/hr over 30 Minutes Intravenous Every 24 hours 06/05/20 0957     06/04/20 1200  cefdinir (OMNICEF) capsule 300 mg  Status:  Discontinued        300 mg Oral Daily 06/04/20 1031 06/05/20 0948   06/04/20 1045  cefdinir (OMNICEF) capsule 300 mg  Status:  Discontinued        30" 0 mg Oral Every 12 hours 06/04/20 0957 06/04/20 1031   06/01/20 1830  ceFEPIme (MAXIPIME) 2 g in sodium chloride 0.9 % 100 mL IVPB  Status:  Discontinued        2 g 200 mL/hr over 30 Minutes Intravenous Every 24 hours 05/31/20 1831 06/04/20 0957   05/31/20 1830  vancomycin variable dose per unstable renal function (pharmacist dosing)  Status:  Discontinued         Does not apply See admin instructions 05/31/20 1831 06/02/20 1348   05/31/20 1815  ceFEPIme (MAXIPIME) 2 g in sodium chloride 0.9 % 100 mL IVPB        2 g 200 mL/hr over 30 Minutes Intravenous  Once 05/31/20 1801 05/31/20 1919   05/31/20 1815  metroNIDAZOLE  (FLAGYL) IVPB 500 mg        500 mg 100 mL/hr over 60 Minutes Intravenous  Once 05/31/20 1801 05/31/20 2253   05/31/20 1815  vancomycin (VANCOCIN) IVPB 1000 mg/200 mL premix        1,000 mg 200 mL/hr over 60 Minutes Intravenous  Once 05/31/20 1809 06/01/20 0001      Subjective  The patient is resting comfortably in bed. [name] [name] states that [name] [name] had increased shortness of breath last night. [name] [name] has requested that we restart his lasix.  Objective   Vitals:  Vitals:   06/06/20 0904 06/06/20 1326  BP:  112/60  Pulse:  63  Resp:  16  Temp:  98.2 F (36.8 C)  SpO2: 92% 93%   Exam:  Constitutional:  . The patient is awake, alert, and oriented x 3. No acute distress.  Respiratory:  . No increased work of breathing. . No wheezes, rales, or rhonchi . No tactile fremitus . Diminished breath sounds bilaterally. Cardiovascular:  . Regular rate and rhythm . No murmurs, ectopy, or gallups. . No lateral PMI. No thrills. Abdomen:  . Abdomen is soft, non-tender, non-distended . No hernias, masses, or organomegaly . Normoactive bowel sounds.  Musculoskeletal:  . No cyanosis, clubbing, or edema Skin:  . No rashes, lesions, ulcers . palpation of skin: no induration or nodules . No edema Neurologic:  . CN 2-12 intact . Sensation all 4 extremities intact Psychiatric:  . Mental status o Mood, affect appropriate o Orientation to person, place, time  . judgment and insight appear intact  I have personally reviewed the following:   Today's Data  . Vitals, CBC, CMP  Micro Data  . Blood cultures x 2 - No growth . MRSA by PCR - negative  Scheduled Meds: . carvedilol  12.5 mg Oral BID WC  . cholecalciferol  400 Units Oral Daily  . ezetimibe  10 mg Oral Daily  . furosemide  20 mg Intravenous BID  . furosemide  20 mg Oral Daily  . pantoprazole  40 mg Oral BID AC   Continuous Infusions: . ceFEPime (MAXIPIME) IV 2 g (06/06/20 1210)  . vancomycin Stopped (06/05/20 2154)    Principal  Problem:   Acute renal failure superimposed on stage 3 chronic kidney disease (HCC) Active Problems:   Chronic systolic CHF (congestive heart failure) (HCC)   CAD (coronary artery disease)   Lung nodule   Enteritis   ARF (acute renal failure) (HCC)   Heme positive stool   Acute blood loss anemia   Acute gastric ulcer without hemorrhage or perforation   Duodenal ulcer disease   Barrett's esophagus with dysplasia   Barrett's esophagus without dysplasia   Hiatal hernia   LOS: 5 days   A & P  Acute on chronic kidney disease stage III: Improved with decrease in creatinine from 2.37 to 1.66 with IV fluids now stopped. Elevation  in creatinine was precipitated by patient's diarrhea and  Vomiting through volume depletion.  Patient also takes ARB which could have further worsen.  Will hold off ARB patient received fluids in the ER. Monitor creatinine, electrolytes, and volume  Carefully as we restart oral lasix as the patient was taking prior to admission. Avoid nephrotoxins and hypotensions.   Pneumonia was initially placed on IV Vancomycin and IV cefepime. The antibiotics were narrowed when PCR for MRSA came back negative. The patient's pneumonia was treated with 5 days of IV Cefepime . When the patient had been afebrile and had not required oxygen for more than 24 hours, [name] [name] was converted to oral antibiotics. The night after this change was made, the patient again became febrile and hypoxic. CXR demonstrated increased densities at the medial left lung base and patchy densities in the right upper lung, and increased opacification in the left upper lung suggesting worsening pneumonia. WBC is increased. [name] [name] has been returned to IV antibiotics. I have added back Vancomycin to cover for possible HCAP. WBC is stable today.  Diarrhea and vomiting: Resolved. This presentation could be from recent use of antibiotics but patient also has history of multiple midgut volvulus CT does not show any signs of that now.   Patient also was recently treated for SBO. Stool was found to be C Diff negative, but FOBT positive.  Acute blood loss anemia: Due to upper GI bleed. Drop in hemoglobin from 13.3 on presentation on 05/31/2020 to 8.2 on 07/06/2020. Up to 8.7 this morning. FOBT positive. Monitor hemoglobin and transfuse for hemoglobin less than 7.0. Monitor.  GI bleed: demonstrated Barrett's esophagus, erosive gastritis with (2)  shallow ulcerations in the stomach, and 5 non-bleeding duodenal ulcers and duodenitis with no stigmata of bleeding . The second part of the duodenum was normal. The patietn's diet will be advanced as tolerated. [name] [name] will be continued on BID PPI x 8 weeks then dropping down to 1 PO daily. The patient is to avoid ASA, NSAIDS, although low dose ASA is acceptable. [name] [name] is to follow up with Dr. Havery Moros at discharge.  Hypocalcemia: Correected Calcium 8.7. Below normal level of 8.9. Will give the patient another 1 gm of calcium gluconate. Monitor.  Vitamin D Deficiency: Vitamin D 1,25 found to be 7.15, below low normal level of 30. Will supplement.  CAD status post stenting denies chest pain.  On aspirin and Zetia.  History of CHF presently holding of ARB due to acute renal failure.  Hold of any diuretics.  History of CML on Bosulif. Holding currently as is can cause neutropenia.  Since patient has multiple comorbidities with acute on chronic kidney disease will need close monitoring for any further worsening in inpatient status.  DVT prophylaxis: SCDs for now until you make sure there is no significant fall in hemoglobin. Code Status: Full code. Family Communication: Discussed with patient. Disposition Plan: The patient is from home. Anticipate discharge to home. Barriers to discharge: Worsening pneumoonia. Status is: Inpatient  Remains inpatient appropriate because:Hemodynamically unstable and Inpatient level of care appropriate due to severity of illness   Dispo: The patient is from:  Home              Anticipated d/c is to: Home              Anticipated d/c date is: 1 day              Patient currently is not medically stable to d/c. Pt did not tolerate discontinuation  of IV antibiotics well and has required supplemental o2. Iv antibiotics restarted and expanded.  Taiven Greenley, DO [facility] Direct contact: see [email or web address]  7PM-7AM contact night coverage as above 06/06/2020, 2:02 PM  LOS: 1 day

## 2020-06-07 DIAGNOSIS — D62 Acute posthemorrhagic anemia: Secondary | ICD-10-CM | POA: Diagnosis not present

## 2020-06-07 DIAGNOSIS — R935 Abnormal findings on diagnostic imaging of other abdominal regions, including retroperitoneum: Secondary | ICD-10-CM | POA: Diagnosis not present

## 2020-06-07 DIAGNOSIS — R112 Nausea with vomiting, unspecified: Secondary | ICD-10-CM | POA: Diagnosis not present

## 2020-06-07 DIAGNOSIS — D509 Iron deficiency anemia, unspecified: Secondary | ICD-10-CM | POA: Diagnosis not present

## 2020-06-07 LAB — CBC
HCT: 28.8 % — ABNORMAL LOW (ref 39.0–52.0)
Hemoglobin: 8.6 g/dL — ABNORMAL LOW (ref 13.0–17.0)
MCH: 27.9 pg (ref 26.0–34.0)
MCHC: 29.9 g/dL — ABNORMAL LOW (ref 30.0–36.0)
MCV: 93.5 fL (ref 80.0–100.0)
Platelets: 351 10*3/uL (ref 150–400)
RBC: 3.08 MIL/uL — ABNORMAL LOW (ref 4.22–5.81)
RDW: 15.3 % (ref 11.5–15.5)
WBC: 13.3 10*3/uL — ABNORMAL HIGH (ref 4.0–10.5)
nRBC: 0 % (ref 0.0–0.2)

## 2020-06-07 MED ORDER — ALBUTEROL SULFATE (2.5 MG/3ML) 0.083% IN NEBU
2.5000 mg | INHALATION_SOLUTION | Freq: Two times a day (BID) | RESPIRATORY_TRACT | Status: DC
  Administered 2020-06-08 – 2020-06-09 (×3): 2.5 mg via RESPIRATORY_TRACT
  Filled 2020-06-07 (×3): qty 3

## 2020-06-07 NOTE — Care Management Important Message (Signed)
Important Message  Patient Details IM Letter given to Evette Cristal SW Case Manager to present to the Patient Name: [name] [name] MRN: [id number] Date of Birth: [birth year]   Medicare Important Message Given:  Yes     Kerin Salen 06/07/2020, 12:16 PM

## 2020-06-07 NOTE — Progress Notes (Signed)
PT Cancellation Note  Patient Details Name: [name] [name] MRN: [id number] DOB: 23-Dec-1929   Cancelled Treatment:    Reason Eval/Treat Not Completed: Patient declined, no reason specified Pt standing from recliner on arrival to room and appeared very frustrated and irritated stating he called out over an hour ago and no one has been in to help him back to bed.  Pt assisted back to bed min/guard and more pleasant once therapist provided all his requests.  Pt also reapplied 2L O2 [place] (was off on arrival to room).  Pt declined mobility today however stating he had been up in the recliner too long.   [name],KATHrine E 06/07/2020, 4:43 PM Jannette Spanner PT, DPT Acute Rehabilitation Services Pager: [phone] Office: [phone]

## 2020-06-07 NOTE — Progress Notes (Signed)
PROGRESS NOTE  [name] [name] VIF:[id number] DOB: Jan 28, 1930 DOA: 05/31/2020 PCP: Drake Leach, MD  Brief History   TGR:[name] Mooreis a 86 y.o.malewithhistory of CAD status post stenting, CHF, CML, chronic kidney disease stage III who has 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 After 4 days patient again had to go to his primary care because symptoms did not improve and [name] was added to his amoxicillin. On "Sunday there is 48 hours ago patient had multiple episodes of diarrhea at least 10 episodes with some vomiting. Denies any abdominal pain. He took Imodium yesterday morning following which his diarrhea stopped but after about 8 hours later he started having diarrhea again. At this point he decided to come to the ER. Denies chest pain or shortness of breath. ED Course:In the ER patient had blood work done which showed marked leukocytosis of 23,000 creatinine worsened from 1.6-3.12 CT chest abdomen pelvis was done which shows bilateral upper lobe infiltrates. CT abdomen was largely unremarkable except for small abdominal aneurysm. Patient was started on empiric antibiotics and admitted for further management of acute renal failure with diarrhea. Stool for occult blood was positive but patient did not see any obvious bleeding. As patient's hemoglobin has dropped from 13.3 to 8.3, GI was consulted. They plan on doing EGD later today.  EGD demonstrated Barrett's esophagus, erosive gastritis with (2)  shallow ulcerations in the stomach, and 5 non-bleeding duodenal ulcers and duodenitis with no stigmata of bleeding . The second part of the duodenum was normal.   The patient's pneumonia was treated with IV Cefepime. When the patient had been afebrile and had not required oxygen for  more than 24 hours, he was converted to oral antibiotics. The night after this change was made, the patient again became febrile and hypoxic. CXR demonstrated increased densities at the medial left lung base and patchy densities in the right upper lung, and increased opacification in the left upper lung suggesting worsening pneumonia. WBC is increased. He has been returned to IV antibiotics. Oxygen requirements are trending down.  GI has recommended continuing PPI x 8 weeks followed by daily PPI with follow up with [name] as outpatient both to follow GI bleed and to have MRCP/MRI as outpatient. They have signed off. Consultants  . Gastroenterology  Procedures  . EGD  Antibiotics   Anti-infectives (From admission, onward)   Start     Dose/Rate Route Frequency Ordered Stop   06/05/20 1800  vancomycin (VANCOREADY) IVPB 500 mg/100 mL     Discontinue     500 mg 100 mL/hr over 60 Minutes Intravenous Every 24 hours 06/05/20 1512     06/05/20 1200  ceFEPIme (MAXIPIME) 2 g in sodium chloride 0.9 % 100 mL IVPB     Discontinue     2 g 200 mL/hr over 30 Minutes Intravenous Every 24 hours 06/05/20 0957     06/04/20 1200  cefdinir (OMNICEF) capsule 300 mg  Status:  Discontinued        300 mg Oral Daily 06/04/20 1031 06/05/20 0948   06/04/20 1045  cefdinir (OMNICEF) capsule 300 mg  Status:  Discontinued        30" 0 mg Oral Every 12 hours 06/04/20 0957 06/04/20 1031   06/01/20 1830  ceFEPIme (MAXIPIME) 2 g in sodium chloride 0.9 %  100 mL IVPB  Status:  Discontinued        2 g 200 mL/hr over 30 Minutes Intravenous Every 24 hours 05/31/20 1831 06/04/20 0957   05/31/20 1830  vancomycin variable dose per unstable renal function (pharmacist dosing)  Status:  Discontinued         Does not apply See admin instructions 05/31/20 1831 06/02/20 1348   05/31/20 1815  ceFEPIme (MAXIPIME) 2 g in sodium chloride 0.9 % 100 mL IVPB        2 g 200 mL/hr over 30 Minutes Intravenous  Once 05/31/20 1801 05/31/20  1919   05/31/20 1815  metroNIDAZOLE (FLAGYL) IVPB 500 mg        500 mg 100 mL/hr over 60 Minutes Intravenous  Once 05/31/20 1801 05/31/20 2253   05/31/20 1815  vancomycin (VANCOCIN) IVPB 1000 mg/200 mL premix        1,000 mg 200 mL/hr over 60 Minutes Intravenous  Once 05/31/20 1809 06/01/20 0001      Subjective  The patient is resting comfortably in bed. No new complaints.  Objective   Vitals:  Vitals:   06/07/20 1204 06/07/20 1310  BP:  (!) 109/53  Pulse:  60  Resp:  16  Temp:  99 F (37.2 C)  SpO2: 92% 90%   Exam:  Constitutional:  . The patient is awake, alert, and oriented x 3. No acute distress.  Respiratory:  . No increased work of breathing. . No wheezes, rales, or rhonchi . No tactile fremitus . Diminished breath sounds bilaterally. Cardiovascular:  . Regular rate and rhythm . No murmurs, ectopy, or gallups. . No lateral PMI. No thrills. Abdomen:  . Abdomen is soft, non-tender, non-distended . No hernias, masses, or organomegaly . Normoactive bowel sounds.  Musculoskeletal:  . No cyanosis, clubbing, or edema Skin:  . No rashes, lesions, ulcers . palpation of skin: no induration or nodules . No edema Neurologic:  . CN 2-12 intact . Sensation all 4 extremities intact Psychiatric:  . Mental status o Mood, affect appropriate o Orientation to person, place, time  . judgment and insight appear intact  I have personally reviewed the following:   Today's Data  . Vitals, CBC, CMP  Micro Data  . Blood cultures x 2 - No growth . MRSA by PCR - negative  Scheduled Meds: . carvedilol  12.5 mg Oral BID WC  . cholecalciferol  400 Units Oral Daily  . ezetimibe  10 mg Oral Daily  . furosemide  20 mg Intravenous BID  . furosemide  20 mg Oral Daily  . pantoprazole  40 mg Oral BID AC   Continuous Infusions: . ceFEPime (MAXIPIME) IV 2 g (06/07/20 1141)  . vancomycin 500 mg (06/07/20 1709)    Principal Problem:   Acute renal failure superimposed on  stage 3 chronic kidney disease (HCC) Active Problems:   Chronic systolic CHF (congestive heart failure) (HCC)   CAD (coronary artery disease)   Lung nodule   Enteritis   ARF (acute renal failure) (HCC)   Heme positive stool   Acute blood loss anemia   Acute gastric ulcer without hemorrhage or perforation   Duodenal ulcer disease   Barrett's esophagus with dysplasia   Barrett's esophagus without dysplasia   Hiatal hernia   LOS: 6 days   A & P  Acute on chronic kidney disease stage III: Improved with decrease in creatinine from 2.37 to 1.66 with IV fluids now stopped. Elevation in creatinine was precipitated by patient's diarrhea and  Vomiting through volume depletion.  Patient also takes ARB which could have further worsen.  Will hold off ARB patient received fluids in the ER. Monitor creatinine, electrolytes, and volume  Carefully as we restart oral lasix as the patient was taking prior to admission. Avoid nephrotoxins and hypotensions.   Pneumonia was initially placed on IV Vancomycin and IV cefepime. The antibiotics were narrowed when PCR for MRSA came back negative. The patient's pneumonia was treated with 5 days of IV Cefepime . When the patient had been afebrile and had not required oxygen for more than 24 hours, he was converted to oral antibiotics. The night after this change was made, the patient again became febrile and hypoxic. CXR demonstrated increased densities at the medial left lung base and patchy densities in the right upper lung, and increased opacification in the left upper lung suggesting worsening pneumonia. WBC is increased. He has been returned to IV antibiotics. I have added back Vancomycin to cover for possible HCAP. WBC is stable today.  Diarrhea and vomiting: Resolved. This presentation could be from recent use of antibiotics but patient also has history of multiple midgut volvulus CT does not show any signs of that now.  Patient also was recently treated for SBO.  Stool was found to be C Diff negative, but FOBT positive.  Acute blood loss anemia: Due to upper GI bleed. Drop in hemoglobin from 13.3 on presentation on 05/31/2020 to 8.2 on 07/06/2020. Up to 8.7 this morning. FOBT positive. Monitor hemoglobin and transfuse for hemoglobin less than 7.0. Monitor.  GI bleed: demonstrated Barrett's esophagus, erosive gastritis with (2)  shallow ulcerations in the stomach, and 5 non-bleeding duodenal ulcers and duodenitis with no stigmata of bleeding . The second part of the duodenum was normal. The patietn's diet will be advanced as tolerated. He will be continued on BID PPI x 8 weeks then dropping down to 1 PO daily. The patient is to avoid ASA, NSAIDS, although low dose ASA is acceptable. He is to follow up with Dr. Havery Moros at discharge.  Hypocalcemia: Correected Calcium 8.7. Below normal level of 8.9. Will give the patient another 1 gm of calcium gluconate. Monitor.  Vitamin D Deficiency: Vitamin D 1,25 found to be 7.15, below low normal level of 30. Will supplement.  CAD status post stenting denies chest pain.  On aspirin and Zetia.  History of CHF presently holding of ARB due to acute renal failure.  Hold of any diuretics.  History of CML on Bosulif. Holding currently as is can cause neutropenia.  Since patient has multiple comorbidities with acute on chronic kidney disease will need close monitoring for any further worsening in inpatient status.  I have seen and examined this patient myself. I have spent 38 minutes in his evaluation and care.  DVT prophylaxis: SCDs for now until you make sure there is no significant fall in hemoglobin. Code Status: Full code. Family Communication: Discussed with patient. Disposition Plan: The patient is from home. Anticipate discharge to home. Barriers to discharge: Worsening pneumoonia. Status is: Inpatient  Remains inpatient appropriate because:Hemodynamically unstable and Inpatient level of care appropriate due  to severity of illness   Dispo: The patient is from: Home              Anticipated d/c is to: Home              Anticipated d/c date is: 1 day              Patient currently is  not medically stable to d/c. Pt did not tolerate discontinuation of IV antibiotics well and has required supplemental o2. Iv antibiotics restarted and expanded.  Dean Goldner, DO [facility] Direct contact: see [email or web address]  7PM-7AM contact night coverage as above 06/07/2020, 5:20 PM  LOS: 1 day

## 2020-06-08 DIAGNOSIS — R935 Abnormal findings on diagnostic imaging of other abdominal regions, including retroperitoneum: Secondary | ICD-10-CM | POA: Diagnosis not present

## 2020-06-08 DIAGNOSIS — D62 Acute posthemorrhagic anemia: Secondary | ICD-10-CM | POA: Diagnosis not present

## 2020-06-08 DIAGNOSIS — D509 Iron deficiency anemia, unspecified: Secondary | ICD-10-CM | POA: Diagnosis not present

## 2020-06-08 DIAGNOSIS — R112 Nausea with vomiting, unspecified: Secondary | ICD-10-CM | POA: Diagnosis not present

## 2020-06-08 LAB — BASIC METABOLIC PANEL
Anion gap: 8 (ref 5–15)
BUN: 17 mg/dL (ref 8–23)
CO2: 25 mmol/L (ref 22–32)
Calcium: 7.5 mg/dL — ABNORMAL LOW (ref 8.9–10.3)
Chloride: 101 mmol/L (ref 98–111)
Creatinine, Ser: 1.73 mg/dL — ABNORMAL HIGH (ref 0.61–1.24)
GFR calc Af Amer: 40 mL/min — ABNORMAL LOW (ref >60)
GFR calc non Af Amer: 35 mL/min — ABNORMAL LOW (ref >60)
Glucose, Bld: 144 mg/dL — ABNORMAL HIGH (ref 70–99)
Potassium: 4.7 mmol/L (ref 3.5–5.1)
Sodium: 134 mmol/L — ABNORMAL LOW (ref 135–145)

## 2020-06-08 LAB — CBC
HCT: 28.1 % — ABNORMAL LOW (ref 39.0–52.0)
Hemoglobin: 8.4 g/dL — ABNORMAL LOW (ref 13.0–17.0)
MCH: 28.2 pg (ref 26.0–34.0)
MCHC: 29.9 g/dL — ABNORMAL LOW (ref 30.0–36.0)
MCV: 94.3 fL (ref 80.0–100.0)
Platelets: 321 10*3/uL (ref 150–400)
RBC: 2.98 MIL/uL — ABNORMAL LOW (ref 4.22–5.81)
RDW: 15.2 % (ref 11.5–15.5)
WBC: 10.6 10*3/uL — ABNORMAL HIGH (ref 4.0–10.5)
nRBC: 0 % (ref 0.0–0.2)

## 2020-06-08 MED ORDER — MIDAZOLAM HCL 2 MG/2ML IJ SOLN
INTRAMUSCULAR | Status: AC
  Filled 2020-06-08: qty 2

## 2020-06-08 NOTE — Progress Notes (Signed)
Pharmacy Antibiotic Note  [name] [name] is a 86 y.o. 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 Chest CTA on 7/12 showed "superimposed bilateral upper lobe pneumonic infiltrate".  Cefepime changed to omnicef on 7/16.  CXR on 7/17 showed findings to suggest worsening of PNA.  Abx changed back to vancomycin and cefepime on 7/17  Today, 06/08/2020: - day #8 abx - afeb, wbc down 10.6 - scr up 1.73 (crcl~26) --> on lasix IV  Plan: - continue cefepime 2g IV q24h and vancomycin 500 mg IV q24h - Monitor renal function closely and adjust dose if/when appropriate - F/u plan/LOT for abx. Pharmacy will plan on checking vancomycin level soon if plan is to continue with vancomycin for >7 days _____________________________________  Height: 5\' 9"  (175.3 cm) Weight: 61.4 kg (135 lb 5.8 oz) IBW/kg (Calculated) : 70.7  Temp (24hrs), Avg:98.9 F (37.2 C), Min:98.4 F (36.9 C), Max:99.2 F (37.3 C)  Recent Labs  Lab 06/03/20 0531 06/03/20 0531 06/03/20 1105 06/03/20 1105 06/04/20 0456 06/05/20 0554 06/06/20 0638 06/07/20 0535 06/08/20 0555  WBC 12.3*   < > 13.5*   < > 13.8* 15.0* 15.0* 13.3* 10.6*  CREATININE 1.65*  --   --   --  1.66* 1.67* 1.66*  --  1.73*  LATICACIDVEN 0.8  --  1.1  --   --   --   --   --   --    < > = values in this interval not displayed.    Estimated Creatinine Clearance: 26.1 mL/min (A) (by C-G formula based on SCr of 1.73 mg/dL (H)).    Allergies  Allergen Reactions   Crestor [Rosuvastatin] Itching   Percocet [Oxycodone-Acetaminophen] Itching   Spironolactone Other (See Comments)    Nipples sore and swelling    Metronidazole Other (See Comments)    Insomnia and nervousness   Antimicrobials this admission:  7/12 Cefepime >> 7/16, 7/17 >>  7/12 Vanco >>7/14, 7/17 >>  7/12 flagyl x 1 dose 7/16 cefdinir >> 7/17  Microbiology results:  7/12 BCx2: ngf 7/12 C diff neg 7/12  covid neg 7/13 MRSA: neg Thank you for allowing pharmacy to be a part of this patients care.  Dia Sitter P 06/08/2020 8:16 AM

## 2020-06-08 NOTE — Progress Notes (Signed)
PROGRESS NOTE  [name] [name] ORV:[id number] DOB: 29-Sep-1930 DOA: 05/31/2020 PCP: [name] Leach, MD  Brief History   [name] [name] a 86 y.o.malewithhistory of CAD status post stenting, CHF, CML, chronic kidney disease stage III who has 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 After 4 days patient again had to go to his primary care because symptoms did not improve and [name] was added to his amoxicillin. On "Sunday there is 48 hours ago patient had multiple episodes of diarrhea at least 10 episodes with some vomiting. Denies any abdominal pain. He took Imodium yesterday morning following which his diarrhea stopped but after about 8 hours later he started having diarrhea again. At this point he decided to come to the ER. Denies chest pain or shortness of breath. ED Course:In the ER patient had blood work done which showed marked leukocytosis of 23,000 creatinine worsened from 1.6-3.12 CT chest abdomen pelvis was done which shows bilateral upper lobe infiltrates. CT abdomen was largely unremarkable except for small abdominal aneurysm. Patient was started on empiric antibiotics and admitted for further management of acute renal failure with diarrhea. Stool for occult blood was positive but patient did not see any obvious bleeding. As patient's hemoglobin has dropped from 13.3 to 8.3, GI was consulted. EGD was performed later that day.  EGD demonstrated Barrett's esophagus, erosive gastritis with (2)  shallow ulcerations in the stomach, and 5 non-bleeding duodenal ulcers and duodenitis with no stigmata of bleeding . The second part of the duodenum was normal.   The patient's pneumonia was treated with IV Cefepime. When the patient had been afebrile and had not required oxygen for  more than 24 hours, he was converted to oral antibiotics. The night after this change was made, the patient again became febrile and hypoxic. CXR demonstrated increased densities at the medial left lung base and patchy densities in the right upper lung, and increased opacification in the left upper lung suggesting worsening pneumonia. WBC is increased. He has been returned to IV antibiotics. Oxygen requirements are trending down. We are trying to wean him off of O2.  GI has recommended continuing PPI x 8 weeks followed by daily PPI with follow up with [name] [name] as outpatient both to follow GI bleed and to have MRCP/MRI as outpatient. They have signed off. Consultants  . Gastroenterology  Procedures  . EGD  Antibiotics   Anti-infectives (From admission, onward)   Start     Dose/Rate Route Frequency Ordered Stop   06/05/20 1800  vancomycin (VANCOREADY) IVPB 500 mg/100 mL     Discontinue     500 mg 100 mL/hr over 60 Minutes Intravenous Every 24 hours 06/05/20 1512     06/05/20 1200  ceFEPIme (MAXIPIME) 2 g in sodium chloride 0.9 % 100 mL IVPB     Discontinue     2 g 200 mL/hr over 30 Minutes Intravenous Every 24 hours 06/05/20 0957     06/04/20 1200  cefdinir (OMNICEF) capsule 300 mg  Status:  Discontinued        300 mg Oral Daily 06/04/20 1031 06/05/20 0948   06/04/20 1045  cefdinir (OMNICEF) capsule 300 mg  Status:  Discontinued        30" 0 mg Oral Every 12 hours 06/04/20 0957 06/04/20 1031   06/01/20 1830  ceFEPIme (  MAXIPIME) 2 g in sodium chloride 0.9 % 100 mL IVPB  Status:  Discontinued        2 g 200 mL/hr over 30 Minutes Intravenous Every 24 hours 05/31/20 1831 06/04/20 0957   05/31/20 1830  vancomycin variable dose per unstable renal function (pharmacist dosing)  Status:  Discontinued         Does not apply See admin instructions 05/31/20 1831 06/02/20 1348   05/31/20 1815  ceFEPIme (MAXIPIME) 2 g in sodium chloride 0.9 % 100 mL IVPB        2 g 200 mL/hr over 30 Minutes  Intravenous  Once 05/31/20 1801 05/31/20 1919   05/31/20 1815  metroNIDAZOLE (FLAGYL) IVPB 500 mg        500 mg 100 mL/hr over 60 Minutes Intravenous  Once 05/31/20 1801 05/31/20 2253   05/31/20 1815  vancomycin (VANCOCIN) IVPB 1000 mg/200 mL premix        1,000 mg 200 mL/hr over 60 Minutes Intravenous  Once 05/31/20 1809 06/01/20 0001      Subjective  The patient is resting comfortably in bed. No new complaints. He states that he is feeling better.  Objective   Vitals:  Vitals:   06/08/20 0815 06/08/20 1354  BP:  137/63  Pulse:  64  Resp:  17  Temp:  98.7 F (37.1 C)  SpO2: 98% 94%   Exam:  Constitutional:  . The patient is awake, alert, and oriented x 3. No acute distress.  Respiratory:  . No increased work of breathing. . No wheezes, rales, or rhonchi . No tactile fremitus . Diminished breath sounds bilaterally. Cardiovascular:  . Regular rate and rhythm . No murmurs, ectopy, or gallups. . No lateral PMI. No thrills. Abdomen:  . Abdomen is soft, non-tender, non-distended . No hernias, masses, or organomegaly . Normoactive bowel sounds.  Musculoskeletal:  . No cyanosis, clubbing, or edema Skin:  . No rashes, lesions, ulcers . palpation of skin: no induration or nodules . No edema Neurologic:  . CN 2-12 intact . Sensation all 4 extremities intact Psychiatric:  . Mental status o Mood, affect appropriate o Orientation to person, place, time  . judgment and insight appear intact  I have personally reviewed the following:   Today's Data  . Vitals, CBC, CMP  Micro Data  . Blood cultures x 2 - No growth . MRSA by PCR - negative  Scheduled Meds: . albuterol  2.5 mg Nebulization BID  . carvedilol  12.5 mg Oral BID WC  . cholecalciferol  400 Units Oral Daily  . ezetimibe  10 mg Oral Daily  . furosemide  20 mg Intravenous BID  . furosemide  20 mg Oral Daily  . pantoprazole  40 mg Oral BID AC   Continuous Infusions: . ceFEPime (MAXIPIME) IV 2 g  (06/08/20 1140)  . vancomycin 500 mg (06/07/20 1709)    Principal Problem:   Acute renal failure superimposed on stage 3 chronic kidney disease (HCC) Active Problems:   Chronic systolic CHF (congestive heart failure) (HCC)   CAD (coronary artery disease)   Lung nodule   Enteritis   ARF (acute renal failure) (HCC)   Heme positive stool   Acute blood loss anemia   Acute gastric ulcer without hemorrhage or perforation   Duodenal ulcer disease   Barrett's esophagus with dysplasia   Barrett's esophagus without dysplasia   Hiatal hernia   LOS: 7 days   A & P  Acute on chronic kidney disease stage III: Improved  with decrease in creatinine from 2.37 to 1.66 with IV fluids now stopped. Elevation in creatinine was precipitated by patient's diarrhea and  Vomiting through volume depletion.  Patient also takes ARB which could have further worsen.  Will hold off ARB patient received fluids in the ER. Monitor creatinine, electrolytes, and volume  Carefully as we restart oral lasix as the patient was taking prior to admission. Avoid nephrotoxins and hypotensions.   Pneumonia was initially placed on IV Vancomycin and IV cefepime. The antibiotics were narrowed when PCR for MRSA came back negative. The patient's pneumonia was treated with 5 days of IV Cefepime . When the patient had been afebrile and had not required oxygen for more than 24 hours, he was converted to oral antibiotics. The night after this change was made, the patient again became febrile and hypoxic. CXR demonstrated increased densities at the medial left lung base and patchy densities in the right upper lung, and increased opacification in the left upper lung suggesting worsening pneumonia. WBC is increased. He has been returned to IV antibiotics. I have added back Vancomycin to cover for possible HCAP. WBC continues to trend down today.  Diarrhea and vomiting: Resolved. This presentation could be from recent use of antibiotics but patient  also has history of multiple midgut volvulus CT does not show any signs of that now.  Patient also was recently treated for SBO. Stool was found to be C Diff negative, but FOBT positive.  Acute blood loss anemia: Due to upper GI bleed. Drop in hemoglobin from 13.3 on presentation on 05/31/2020 to 8.2 on 07/06/2020. Up to 8.4 this morning. FOBT positive. Monitor hemoglobin and transfuse for hemoglobin less than 7.0. Monitor.  GI bleed: demonstrated Barrett's esophagus, erosive gastritis with (2)  shallow ulcerations in the stomach, and 5 non-bleeding duodenal ulcers and duodenitis with no stigmata of bleeding . The second part of the duodenum was normal. The patietn's diet will be advanced as tolerated. He will be continued on BID PPI x 8 weeks then dropping down to 1 PO daily. The patient is to avoid ASA, NSAIDS, although low dose ASA is acceptable. He is to follow up with Dr. Havery Moros at discharge.  Hypocalcemia: Corrected Calcium 9.2. Above normal level of 8.9. Monitor.  Vitamin D Deficiency: Vitamin D 1,25 found to be 7.15, below low normal level of 30. Will supplement.  CAD status post stenting denies chest pain. On aspirin and Zetia.  History of CHF presently holding of ARB due to acute renal failure.  Hold of any diuretics.  History of CML on Bosulif. Holding currently as is can cause neutropenia.  Since patient has multiple comorbidities with acute on chronic kidney disease will need close monitoring for any further worsening in inpatient status.  I have seen and examined this patient myself. I have spent 32 minutes in his evaluation and care.  DVT prophylaxis: SCDs for now until you make sure there is no significant fall in hemoglobin. Code Status: Full code. Family Communication: Discussed with patient. Disposition Plan: The patient is from home. Anticipate discharge to home. Barriers to discharge: Worsening pneumoonia. Status is: Inpatient  Remains inpatient appropriate  because:Hemodynamically unstable and Inpatient level of care appropriate due to severity of illness   Dispo: The patient is from: Home              Anticipated d/c is to: Home              Anticipated d/c date is: 1 day  Patient currently is not medically stable to d/c. Pt did not tolerate discontinuation of IV antibiotics well and has required supplemental o2. Iv antibiotics restarted and expanded.  Rodrick Payson, DO [facility] Direct contact: see [email or web address]  7PM-7AM contact night coverage as above 06/08/2020, 1:59 PM  LOS: 1 day

## 2020-06-09 DIAGNOSIS — N179 Acute kidney failure, unspecified: Secondary | ICD-10-CM | POA: Diagnosis not present

## 2020-06-09 DIAGNOSIS — K253 Acute gastric ulcer without hemorrhage or perforation: Secondary | ICD-10-CM | POA: Diagnosis not present

## 2020-06-09 DIAGNOSIS — K22719 Barrett's esophagus with dysplasia, unspecified: Secondary | ICD-10-CM | POA: Diagnosis not present

## 2020-06-09 DIAGNOSIS — D62 Acute posthemorrhagic anemia: Secondary | ICD-10-CM | POA: Diagnosis not present

## 2020-06-09 LAB — PROCALCITONIN: Procalcitonin: <0.1 ng/mL

## 2020-06-09 LAB — CREATININE, SERUM
Creatinine, Ser: 1.72 mg/dL — ABNORMAL HIGH (ref 0.61–1.24)
GFR calc Af Amer: 41 mL/min — ABNORMAL LOW (ref >60)
GFR calc non Af Amer: 35 mL/min — ABNORMAL LOW (ref >60)

## 2020-06-09 MED ORDER — LEVOFLOXACIN 750 MG PO TABS
750.0000 mg | ORAL_TABLET | ORAL | 0 refills | Status: DC
Start: 2020-06-09 — End: 2020-06-22

## 2020-06-09 MED ORDER — FUROSEMIDE 20 MG PO TABS: 20.0000 mg | ORAL_TABLET | ORAL | Status: DC

## 2020-06-09 MED ORDER — GUAIFENESIN-DM 100-10 MG/5ML PO SYRP
5.0000 mL | ORAL_SOLUTION | ORAL | 0 refills | Status: DC | PRN
Start: 2020-06-09 — End: 2020-07-16

## 2020-06-09 MED ORDER — METHYLPREDNISOLONE 4 MG PO TBPK
ORAL_TABLET | ORAL | 0 refills | Status: DC
Start: 2020-06-09 — End: 2020-06-22

## 2020-06-09 MED ORDER — LEVOFLOXACIN 500 MG PO TABS
500.0000 mg | ORAL_TABLET | Freq: Every day | ORAL | 0 refills | Status: DC
Start: 2020-06-09 — End: 2020-06-09

## 2020-06-09 NOTE — Discharge Summary (Addendum)
Physician Discharge Summary [facility]    Patient: [name] [name]                   Admit date: 05/31/2020   DOB: [birth year]             Discharge date:06/09/2020/11:00 AM JIR:[id number]                          PCP: Drake Leach, MD  Disposition: Home with Home health   Recommendations for Outpatient Follow-up:   . Follow up: in 1 week  Discharge Condition: Stable   Code Status:   Code Status: Full Code  Diet recommendation: Regular healthy diet   Discharge Diagnoses:    Principal Problem:   Acute renal failure superimposed on stage 3 chronic kidney disease ([place]) Active Problems:   Chronic systolic CHF (congestive heart failure) (HCC)   CAD (coronary artery disease)   Lung nodule   Enteritis   ARF (acute renal failure) (HCC)   Heme positive stool   Acute blood loss anemia   Acute gastric ulcer without hemorrhage or perforation   Duodenal ulcer disease   Barrett's esophagus with dysplasia   Barrett's esophagus without dysplasia   Hiatal hernia   History of Present Illness/ Hospital Course [name] [name] Summary:   [name] [name] a 86 y.o.malewithhistory of CAD status post stenting, CHF, CML, chronic kidney disease stage III who has 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 After 4 days patient again had to go to his primary care because symptoms did not improve and [name] was added to his amoxicillin. On Sunday there is 48 hours ago patient had multiple episodes of diarrhea at least 10 episodes with some vomiting. Denies any abdominal pain. He took Imodium yesterday morning following which his diarrhea stopped but after about 8 hours later he started having diarrhea again. At this point he decided to come to the ER. Denies chest pain or shortness of  breath. ED Course:In the ER patient had blood work done which showed marked leukocytosis of 23,000 creatinine worsened from 1.6-3.12 CT chest abdomen pelvis was done which shows bilateral upper lobe infiltrates. CT abdomen was largely unremarkable except for small abdominal aneurysm. Patient was started on empiric antibiotics and admitted for further management of acute renal failure with diarrhea. Stool for occult blood was positive but patient did not see any obvious bleeding. As patient's hemoglobin has dropped from 13.3 to 8.3, GI was consulted. They plan on doing EGD later today.  EGD demonstrated Barrett's esophagus, erosive gastritis with (2)  shallow ulcerations in the stomach, and 5 non-bleeding duodenal ulcers and duodenitis with no stigmata of bleeding . The second part of the duodenum was normal.   The patient's pneumonia was treated with IV Cefepime. When the patient had been afebrile and had not required oxygen for more than 24 hours, he was converted to oral antibiotics. The night after this change was made, the patient again became febrile and hypoxic. CXR demonstrated increased densities at the medial left lung base and patchy densities in the right upper lung, and increased opacification in the left upper lung suggesting worsening pneumonia. WBC is increased. He has been returned to IV antibiotics. Oxygen requirements are trending down.  GI has recommended continuing PPI  x 8 weeks followed by daily PPI with follow up with Dr. Havery Moros as outpatient both to follow GI bleed and to have MRCP/MRI as outpatient. They have signed off.  ------------------------------------------------------------------------------------------------------------------------------------------  Acute on chronic kidney disease stage III: Improved with decrease in creatinine from 2.37 to 1.66 with IV fluids now stopped. Elevation in creatinine was precipitated by patient's diarrhea and  Vomiting through  volume depletion. Patient also takes ARB which could have further worsen.  Off ARB patient received fluids in the ER. Monitor creatinine, electrolytes, and volume  Carefully as we restart oral lasix as the patient was taking prior to admission.    Pneumonia was initially placed on IV Vancomycin and IV cefepime. The antibiotics were narrowed when PCR for MRSA came back negative. The patient's pneumonia was treated with 5 days of IV Cefepime . When the patient had been afebrile and had not required oxygen for more than 24 hours, he was converted to oral antibiotics. The night after this change was made, the patient again became febrile and hypoxic. CXR demonstrated increased densities at the medial left lung base and patchy densities in the right upper lung, and increased opacification in the left upper lung suggesting worsening pneumonia. WBC is increased. He has been returned to IV antibiotics. I have added back Vancomycin to cover for possible HCAP.  Much improved-afebrile normotensive improved leukocytosis -patient requesting to be discharged home or you leave AMA -we agreed to discharge him since he is been off oxygen, afebrile, normotensive, Prescribing Levaquin 500 p.o. daily for 7 more days    Diarrhea and vomiting: Resolved. This presentation could be from recent use of antibiotics but patient also has history of multiple midgut volvulus CT does not show any signs of that now. Patient also was recently treated for SBO. Stool was found to be C Diff negative, but FOBT positive.  Acute blood loss anemia: Due to upper GI bleed. Drop in hemoglobin from 13.3 on presentation on 05/31/2020 to 8.2 on 07/06/2020. Up to 8.7 this morning. FOBT positive. Monitor hemoglobin and transfuse for hemoglobin less than 7.0. Monitor.  GI bleed: demonstrated Barrett's esophagus, erosive gastritis with (2)  shallow ulcerations in the stomach, and 5 non-bleeding duodenal ulcers and duodenitis with no stigmata of  bleeding . The second part of the duodenum was normal. The patietn's diet will be advanced as tolerated. He will be continued on BID PPI x 8 weeks then dropping down to 1 PO daily. The patient is to avoid ASA, NSAIDS, although low dose ASA is acceptable. He is to follow up with Dr. Havery Moros at discharge.  Hypocalcemia: Correected Calcium 8.7. Below normal level of 8.9. Will give the patient another 1 gm of calcium gluconate. Monitor.  Vitamin D Deficiency: Vitamin D 1,25 found to be 7.15, below low normal level of 30. Will supplement.  CAD status post stenting denies chest pain. On aspirin and Zetia.  History of sCHF -  was holding of ARB due to acute renal failure.  Resuming diuretics, Lasix 20 mg daily, he is to watch and monitor his weight, greater than 5 pounds fluid weight over 24 hours may warrant extra dose of Lasix.  History of CML on Bosulif. Holding currently as is can cause neutropenia.   Severe debility, cachexia Body mass index is 21.12 kg/m. -Patient continues to need PT, home health with PT has been ordered, encouraged oral intake, educated on diet. He is to monitor his weight daily.  Code Status:Full code. Family Communication:Discussed with patient. Disposition Plan:The  patient is from home.. Will be discharged home with home health  Nutritional status:          Discharge Instructions:   Discharge Instructions    (HEART FAILURE PATIENTS) Call MD:  Anytime you have any of the following symptoms: 1) 3 pound weight gain in 24 hours or 5 pounds in 1 week 2) shortness of breath, with or without a dry hacking cough 3) swelling in the hands, feet or stomach 4) if you have to sleep on extra pillows at night in order to breathe.   Complete by: As directed    Activity as tolerated - No restrictions   Complete by: As directed    Call MD for:  difficulty breathing, headache or visual disturbances   Complete by: As directed    Call MD for:  persistant dizziness  or light-headedness   Complete by: As directed    Call MD for:  temperature >100.4   Complete by: As directed    Diet - low sodium heart healthy   Complete by: As directed    Discharge instructions   Complete by: As directed    Fall precaution, continue inspirometer, continue recommended antibiotics Continue Lasix as instructed Follow with the PCP in 1 week.   Increase activity slowly   Complete by: As directed        Medication List    STOP taking these medications   losartan 25 MG tablet Commonly known as: COZAAR     TAKE these medications   aspirin EC 81 MG tablet Take 1 tablet (81 mg total) by mouth daily.   Bosulif 400 MG tablet Generic drug: bosutinib Take 400 mg by mouth daily with breakfast.   carvedilol 12.5 MG tablet Commonly known as: COREG TAKE ONE TABLET BY MOUTH TWICE A DAY What changed: when to take this   cyanocobalamin 1000 MCG tablet Take 1,000 mcg by mouth daily.   diphenhydramine-acetaminophen 25-500 MG Tabs tablet Commonly known as: TYLENOL PM Take 2 tablets by mouth at bedtime.   ezetimibe 10 MG tablet Commonly known as: ZETIA Take 1 tablet (10 mg total) by mouth daily.   famotidine 20 MG tablet Commonly known as: PEPCID Take 20 mg by mouth as needed for heartburn or indigestion.   furosemide 20 MG tablet Commonly known as: LASIX Take 1 tablet (20 mg total) by mouth See admin instructions. Take 20 mg daily for 3 days as needed if  weight gain of 3 lbs in a week or 2 lbs in 24 hrs.   GAS-X PO Take 1 tablet by mouth daily as needed (gas).   guaiFENesin-dextromethorphan 100-10 MG/5ML syrup Commonly known as: ROBITUSSIN DM Take 5 mLs by mouth every 4 (four) hours as needed for cough.   hydrocortisone cream 1 % Apply 1 application topically 2 (two) times daily as needed for itching.   Iron 325 (65 Fe) MG Tabs Take 325 mg by mouth daily.   levofloxacin 750 MG tablet Commonly known as: Levaquin Take 1 tablet (750 mg total) by mouth  every other day for 7 doses.   loperamide 2 MG capsule Commonly known as: IMODIUM Take 2 mg by mouth as needed for diarrhea or loose stools.   methylPREDNISolone 4 MG Tbpk tablet Commonly known as: MEDROL DOSEPAK Medrol Dosepak take as instructed   Vitamin C 500 MG Caps Take 500 mg by mouth daily.       Follow-up Information    Willia Craze, NP Follow up on 07/15/2020.   Specialty:  Gastroenterology Why: 10:00 am Contact information: [place] [address] [phone]              Allergies  Allergen Reactions  . Crestor [Rosuvastatin] Itching  . Percocet [Oxycodone-Acetaminophen] Itching  . Spironolactone Other (See Comments)    Nipples sore and swelling   . Metronidazole Other (See Comments)    Insomnia and nervousness     Procedures /Studies:   CT ABDOMEN PELVIS WO CONTRAST  Result Date: 05/31/2020 CLINICAL DATA:  Pneumonia, nausea, diarrhea EXAM: CT CHEST, ABDOMEN AND PELVIS WITHOUT CONTRAST TECHNIQUE: Multidetector CT imaging of the chest, abdomen and pelvis was performed following the standard protocol without IV contrast. COMPARISON:  None. 07/06/2016 FINDINGS: CT CHEST FINDINGS Cardiovascular: There is extensive coronary artery calcification identified. Coronary artery stenting involving the proximal and mid left anterior descending coronary artery has been performed. Global cardiac size within normal limits. Moderate calcification of the mitral valve annulus. Mild calcification of the aortic valve leaflets. No pericardial effusion. Central pulmonary arteries are moderately enlarged in keeping with changes of pulmonary arterial hypertension. Atherosclerotic calcification is seen within the aortic arch and arch vasculature at their origins. No aneurysm. Mediastinum/Nodes: Shotty precarinal and aortopulmonary lymphadenopathy has progressed since prior examination, likely reactive in nature. No frankly pathologic adenopathy is identified within  the thorax. Lungs/Pleura: There is severe centrilobular emphysema again identified. The lungs are symmetrically hyperinflated. Bullous change noted at the lung apices. There is superimpose infiltrate noted within the upper lobes bilaterally, asymmetrically more severe within the left, most in keeping with superimposed acute infection. An 8 mm nodule has developed within the right upper lobe, on image 57/3, new since prior examination and indeterminate. 11 mm nodular lesion within the right upper lobe, axial image 50 is stable.3 mm nodule within the left lower lobe, image 47/3, is also stable and safely considered benign Musculoskeletal: No lytic or blastic bone lesions. CT ABDOMEN PELVIS FINDINGS Hepatobiliary: Cholecystectomy has been performed. Marked extrahepatic and moderate intrahepatic biliary ductal dilation may represent post cholecystectomy change, but appears progressive since prior examination. The liver is otherwise unremarkable. Pancreas: Unremarkable Spleen: Unremarkable Adrenals/Urinary Tract: The adrenal glands are unremarkable. The kidneys are normal in size and position. There is asymmetric mild left renal cortical atrophy. Multiple nonobstructing calculi are identified bilaterally measuring up to 7 mm within the lower pole of the left kidney. There is no hydronephrosis. No ureteral calculi. The bladder is unremarkable. Stomach/Bowel: Surgical changes of partial small bowel resection are identified. The small bowel demonstrates several proximal loops of fluid-filled marginally dilated loops without a discrete point of transition, a nonspecific finding that can be seen in the setting of infectious or inflammatory enteritis. There is, however, no evidence of obstruction. There is no free intraperitoneal gas or fluid. The distal colon and rectum demonstrate air-fluid levels, a finding that may present clinically as diarrhea. The stomach is unremarkable. Vascular/Lymphatic: There is extensive  aortoiliac atherosclerotic calcification. 2.9 cm infrarenal abdominal aortic aneurysm is present. There is no pathologic adenopathy within the abdomen and pelvis. Reproductive: Unremarkable Other: Diffuse body wall wasting noted. Musculoskeletal: Osseous structures are age-appropriate. No lytic or blastic bone lesions. IMPRESSION: Severe emphysema. Superimposed bilateral upper lobe pneumonic infiltrate, asymmetrically more severe within the left upper lobe. No central obstructing mass. New 8 mm pulmonary nodule within the right upper lobe. Non-contrast chest CT at 6-12 months is recommended. If the nodule is stable at time of repeat CT, then future CT at 18-24 months (from today's scan) is considered  optional for low-risk patients, but is recommended for high-risk patients. This recommendation follows the consensus statement: Guidelines for Management of Incidental Pulmonary Nodules Detected on CT Images: From the Fleischner Society 2017; Radiology 2017; 284:228-243. Bilateral nonobstructing nephrolithiasis. Coronary artery and peripheral vascular disease. 2.9 cm infrarenal abdominal aortic aneurysm. Aortic Atherosclerosis (ICD10-I70.0) and Emphysema (ICD10-J43.9). Electronically Signed   By: Fidela Salisbury MD   On: 05/31/2020 17:41   DG Chest 2 View  Result Date: 06/03/2020 CLINICAL DATA:  Hypoxia EXAM: CHEST - 2 VIEW COMPARISON:  05/31/2020 CT. 05/27/2020 chest radiograph from [place]. FINDINGS: Mild hyperinflation. Patient rotated minimally right. Normal heart size. Atherosclerosis in the transverse aorta. No pleural effusion or pneumothorax. Diffuse interstitial thickening. Left greater than right upper lobe airspace disease is slightly increased. Right lower lobe airspace disease is similar. IMPRESSION: Left greater than right airspace disease, overall progressive. Favor pneumonia. Underlying interstitial thickening likely relates to emphysema/chronic bronchitis. Aortic Atherosclerosis  (ICD10-I70.0). Electronically Signed   By: Abigail Miyamoto M.D.   On: 06/03/2020 16:28   CT Chest Wo Contrast  Result Date: 05/31/2020 CLINICAL DATA:  Pneumonia, nausea, diarrhea EXAM: CT CHEST, ABDOMEN AND PELVIS WITHOUT CONTRAST TECHNIQUE: Multidetector CT imaging of the chest, abdomen and pelvis was performed following the standard protocol without IV contrast. COMPARISON:  None. 07/06/2016 FINDINGS: CT CHEST FINDINGS Cardiovascular: There is extensive coronary artery calcification identified. Coronary artery stenting involving the proximal and mid left anterior descending coronary artery has been performed. Global cardiac size within normal limits. Moderate calcification of the mitral valve annulus. Mild calcification of the aortic valve leaflets. No pericardial effusion. Central pulmonary arteries are moderately enlarged in keeping with changes of pulmonary arterial hypertension. Atherosclerotic calcification is seen within the aortic arch and arch vasculature at their origins. No aneurysm. Mediastinum/Nodes: Shotty precarinal and aortopulmonary lymphadenopathy has progressed since prior examination, likely reactive in nature. No frankly pathologic adenopathy is identified within the thorax. Lungs/Pleura: There is severe centrilobular emphysema again identified. The lungs are symmetrically hyperinflated. Bullous change noted at the lung apices. There is superimpose infiltrate noted within the upper lobes bilaterally, asymmetrically more severe within the left, most in keeping with superimposed acute infection. An 8 mm nodule has developed within the right upper lobe, on image 57/3, new since prior examination and indeterminate. 11 mm nodular lesion within the right upper lobe, axial image 50 is stable.3 mm nodule within the left lower lobe, image 47/3, is also stable and safely considered benign Musculoskeletal: No lytic or blastic bone lesions. CT ABDOMEN PELVIS FINDINGS Hepatobiliary: Cholecystectomy has  been performed. Marked extrahepatic and moderate intrahepatic biliary ductal dilation may represent post cholecystectomy change, but appears progressive since prior examination. The liver is otherwise unremarkable. Pancreas: Unremarkable Spleen: Unremarkable Adrenals/Urinary Tract: The adrenal glands are unremarkable. The kidneys are normal in size and position. There is asymmetric mild left renal cortical atrophy. Multiple nonobstructing calculi are identified bilaterally measuring up to 7 mm within the lower pole of the left kidney. There is no hydronephrosis. No ureteral calculi. The bladder is unremarkable. Stomach/Bowel: Surgical changes of partial small bowel resection are identified. The small bowel demonstrates several proximal loops of fluid-filled marginally dilated loops without a discrete point of transition, a nonspecific finding that can be seen in the setting of infectious or inflammatory enteritis. There is, however, no evidence of obstruction. There is no free intraperitoneal gas or fluid. The distal colon and rectum demonstrate air-fluid levels, a finding that may present clinically as diarrhea. The stomach is unremarkable. Vascular/Lymphatic:  There is extensive aortoiliac atherosclerotic calcification. 2.9 cm infrarenal abdominal aortic aneurysm is present. There is no pathologic adenopathy within the abdomen and pelvis. Reproductive: Unremarkable Other: Diffuse body wall wasting noted. Musculoskeletal: Osseous structures are age-appropriate. No lytic or blastic bone lesions. IMPRESSION: Severe emphysema. Superimposed bilateral upper lobe pneumonic infiltrate, asymmetrically more severe within the left upper lobe. No central obstructing mass. New 8 mm pulmonary nodule within the right upper lobe. Non-contrast chest CT at 6-12 months is recommended. If the nodule is stable at time of repeat CT, then future CT at 18-24 months (from today's scan) is considered optional for low-risk patients, but is  recommended for high-risk patients. This recommendation follows the consensus statement: Guidelines for Management of Incidental Pulmonary Nodules Detected on CT Images: From the Fleischner Society 2017; Radiology 2017; 284:228-243. Bilateral nonobstructing nephrolithiasis. Coronary artery and peripheral vascular disease. 2.9 cm infrarenal abdominal aortic aneurysm. Aortic Atherosclerosis (ICD10-I70.0) and Emphysema (ICD10-J43.9). Electronically Signed   By: Fidela Salisbury MD   On: 05/31/2020 17:41   DG CHEST PORT 1 VIEW  Result Date: 06/05/2020 CLINICAL DATA:  Shortness of breath. EXAM: PORTABLE CHEST 1 VIEW COMPARISON:  06/03/2020 and chest CT 05/31/2020 FINDINGS: Increased densities in the left upper lung compatible with worsening airspace disease in this region. Patchy densities in the right upper lung have minimally changed. Heart size is within normal limits and stable. Trachea is midline. Concern for slightly increased densities at the medial left lung base. IMPRESSION: Increased opacification in the left upper lung is suggestive for increased consolidation and worsening pneumonia. Patchy densities in the right upper lung have minimally changed. Slightly increased densities at the medial left lung base could represent developing disease in this area. Recommend attention on follow-up. Electronically Signed   By: Markus Daft M.D.   On: 06/05/2020 12:57    Subjective:   Patient was seen and examined 06/09/2020, 11:00 AM Patient stable today. No acute distress.  No issues overnight Stable for discharge.  Discharge Exam:    Vitals:   06/09/20 0530 06/09/20 0619 06/09/20 0812 06/09/20 0929  BP: (!) 147/73     Pulse: 65   82  Resp: (!) 22   18  Temp: 98.6 F (37 C)     TempSrc: Oral     SpO2: 95%  92% 93%  Weight:  64.9 kg    Height:        General: Pt lying comfortably in bed & appears in no obvious distress. Cardiovascular: S1 & S2 heard, RRR, S1/S2 +. No murmurs, rubs, gallops or  clicks. No JVD or pedal edema. Respiratory: Clear to auscultation without wheezing, rhonchi or crackles. No increased work of breathing. Abdominal:  Non-distended, non-tender & soft. No organomegaly or masses appreciated. Normal bowel sounds heard. CNS: Alert and oriented. No focal deficits. Extremities: no edema, no cyanosis    The results of significant diagnostics from this hospitalization (including imaging, microbiology, ancillary and laboratory) are listed below for reference.      Microbiology:   Recent Results (from the past 240 hour(s))  Blood culture (routine x 2)     Status: None   Collection Time: 05/31/20  1:40 PM   Specimen: BLOOD RIGHT FOREARM  Result Value Ref Range Status   Specimen Description   Final    BLOOD RIGHT FOREARM Performed at [facility], [address] [place], [place] [address]    Special Requests   Final    BOTTLES DRAWN AEROBIC ONLY Blood Culture results may  not be optimal due to an inadequate volume of blood received in culture bottles Performed at [facility], [address] [place], [place] [address]    Culture   Final    NO GROWTH 5 DAYS Performed at [facility], [place] [address], [place], [place] [address]    Report Status 06/05/2020 FINAL  Final  C Difficile Quick Screen w PCR reflex     Status: None   Collection Time: 05/31/20  4:17 PM   Specimen: STOOL  Result Value Ref Range Status   C Diff antigen NEGATIVE NEGATIVE Final   C Diff toxin NEGATIVE NEGATIVE Final   C Diff interpretation No C. difficile detected.  Final    Comment: Performed at [facility], [place] [address], [place], [place] [address]  SARS Coronavirus 2 by RT PCR (hospital order, performed in [facility] hospital lab) Nasopharyngeal     Status: None   Collection Time: 05/31/20  4:17 PM   Specimen: Nasopharyngeal  Result Value Ref Range Status   SARS Coronavirus 2 NEGATIVE NEGATIVE Final    Comment: (NOTE) SARS-CoV-2 target nucleic  acids are NOT DETECTED.  The SARS-CoV-2 RNA is generally detectable in upper and lower respiratory specimens during the acute phase of infection. The lowest concentration of SARS-CoV-2 viral copies this assay can detect is 250 copies / mL. A negative result does not preclude SARS-CoV-2 infection and should not be used as the sole basis for treatment or other patient management decisions.  A negative result may occur with improper specimen collection / handling, submission of specimen other than nasopharyngeal swab, presence of viral mutation(s) within the areas targeted by this assay, and inadequate number of viral copies (<250 copies / mL). A negative result must be combined with clinical observations, patient history, and epidemiological information.  Fact Sheet for Patients:   StrictlyIdeas.no  Fact Sheet for Healthcare Providers: BankingDealers.co.za  This test is not yet approved or  cleared by the [place] FDA and has been authorized for detection and/or diagnosis of SARS-CoV-2 by FDA under an Emergency Use Authorization (EUA).  This EUA will remain in effect (meaning this test can be used) for the duration of the COVID-19 declaration under Section 564(b)(1) of the Act, 21 U.S.C. section 360bbb-3(b)(1), unless the authorization is terminated or revoked sooner.  Performed at [facility], [address] [place], [place] [address]   Blood culture (routine x 2)     Status: None   Collection Time: 05/31/20  4:40 PM   Specimen: BLOOD LEFT FOREARM  Result Value Ref Range Status   Specimen Description   Final    BLOOD LEFT FOREARM Performed at [facility], [address] [place], [place] [address]    Special Requests   Final    BOTTLES DRAWN AEROBIC AND ANAEROBIC Blood Culture adequate volume Performed at [facility], [address] [place], [place] [address]    Culture   Final    NO  GROWTH 5 DAYS Performed at [facility], [place] [address], [place], [place] [address]    Report Status 06/05/2020 FINAL  Final  MRSA PCR Screening     Status: None   Collection Time: 06/01/20  2:59 PM   Specimen: Nasal Mucosa; Nasopharyngeal  Result Value Ref Range Status   MRSA by PCR NEGATIVE NEGATIVE Final    Comment:        The GeneXpert MRSA Assay (FDA  approved for NASAL specimens only), is one component of a comprehensive MRSA colonization surveillance program. It is not intended to diagnose MRSA infection nor to guide or monitor treatment for MRSA infections. Performed at [facility], [place] [address] [place], [place] [address]      Labs:   CBC: Recent Labs  Lab 06/03/20 0531 06/03/20 1105 06/04/20 0456 06/05/20 0554 06/06/20 0638 06/07/20 0535 06/08/20 0555  WBC 12.3*   < > 13.8* 15.0* 15.0* 13.3* 10.6*  NEUTROABS 9.0*  --   --   --   --   --   --   HGB 7.6*   < > 8.0* 8.2* 8.7* 8.6* 8.4*  HCT 23.8*   < > 25.3* 26.9* 28.6* 28.8* 28.1*  MCV 88.5   < > 90.7 91.8 92.3 93.5 94.3  PLT 233   < > 244 246 334 351 321   < > = values in this interval not displayed.   Basic Metabolic Panel: Recent Labs  Lab 06/03/20 0531 06/03/20 0531 06/04/20 0456 06/05/20 0554 06/06/20 0638 06/08/20 0555 06/09/20 0512  NA 137  --  132* 131* 134* 134*  --   K 3.5  --  4.2 4.3 4.6 4.7  --   CL 98  --  95* 93* 94* 101  --   CO2 29  --  28 28 29 25   --   GLUCOSE 127*  --  122* 116* 107* 144*  --   BUN 16  --  14 12 16 17   --   CREATININE 1.65*   < > 1.66* 1.67* 1.66* 1.73* 1.72*  CALCIUM 6.3*  --  6.4* 6.6* 7.0* 7.5*  --    < > = values in this interval not displayed.   Liver Function Tests: Recent Labs  Lab 06/04/20 0456 06/05/20 0554  AST 12* 14*  ALT 11 11  ALKPHOS 116 100  BILITOT 0.5 0.6  PROT 5.3* 5.5*  ALBUMIN 1.8* 1.9*   BNP (last 3 results) No results for input(s): BNP in the last 8760 hours. Cardiac Enzymes: No results for  input(s): CKTOTAL, CKMB, CKMBINDEX, TROPONINI in the last 168 hours. CBG: No results for input(s): GLUCAP in the last 168 hours. Hgb A1c No results for input(s): HGBA1C in the last 72 hours. Lipid Profile No results for input(s): CHOL, HDL, LDLCALC, TRIG, CHOLHDL, LDLDIRECT in the last 72 hours. Thyroid function studies No results for input(s): TSH, T4TOTAL, T3FREE, THYROIDAB in the last 72 hours.  Invalid input(s): FREET3 Anemia work up No results for input(s): VITAMINB12, FOLATE, FERRITIN, TIBC, IRON, RETICCTPCT in the last 72 hours. Urinalysis    Component Value Date/Time   COLORURINE YELLOW 05/31/2020 1405   APPEARANCEUR CLEAR 05/31/2020 1405   LABSPEC >1.030 (H) 05/31/2020 1405   PHURINE 5.0 05/31/2020 1405   GLUCOSEU NEGATIVE 05/31/2020 1405   HGBUR SMALL (A) 05/31/2020 1405   BILIRUBINUR NEGATIVE 05/31/2020 1405   KETONESUR NEGATIVE 05/31/2020 1405   PROTEINUR 30 (A) 05/31/2020 1405   UROBILINOGEN 0.2 09/06/2015 1935   NITRITE NEGATIVE 05/31/2020 1405   LEUKOCYTESUR NEGATIVE 05/31/2020 1405         Time coordinating discharge: Over 45 minutes  SIGNED: Deatra James, MD, FACP, FHM. [facility],  Please use [email or web address] to Page If 7PM-7AM, please contact night-coverage [email or web address], Password [facility] 06/09/2020, 11:00 AM

## 2020-06-09 NOTE — Progress Notes (Signed)
Patient remains alert, oriented(x4), ambulatory. Discharge instructions have been reviewed with pt and son. Questions, concerns were denied at this time.

## 2020-06-09 NOTE — TOC Transition Note (Signed)
Transition of Care [facility]) - CM/SW Discharge Note   Patient Details  Name: [name] [name] MRN: [id number] Date of Birth: 01-07-1930  Transition of Care [facility]) CM/SW Contact:  Dessa Phi, RN Phone Number: 06/09/2020, 11:26 AM   Clinical Narrative: [facility] HHC rep Britany able to accept for HHPT. No further CM needs.      Final next level of care: Home w Home Health Services Barriers to Discharge: No Barriers Identified   Patient Goals and CMS Choice        Discharge Placement                       Discharge Plan and Services                          HH Arranged: PT HH Agency: [facility] Date [place]: 06/09/20 Time [place]: [id number] Representative spoke with at [place]: Scientist, product/process development  Social Determinants of Health ([place]) Interventions     Readmission Risk Interventions No flowsheet data found.

## 2020-06-09 NOTE — Progress Notes (Signed)
Patients oxygen level sustained 92% while out of bed. Will continue to monitor.

## 2020-06-09 NOTE — Progress Notes (Signed)
Occupational Therapy Treatment Patient Details Name: [name] [name] MRN: [id number] DOB: Jul 28, 1930 Today's Date: 06/09/2020    History of present illness 86yo male with ongoing fever and diarrhea, nausea and vomiting. FOBT positive, C-diff negative. Admitted with acute renal failure, CKD stage III. CXR with bilateral upper lobe infiltrates. PMH CHF, CKD, CML, CAD, SBO, HLD, cardiomyopathy, bowel resection, cardiac cath   OT comments  Treatment focused on improving activity tolerance. Patient performed lower body dressing task and ambulation in hall without o2. No assistance needed with lower body dressing and without complaints of shortness of breath. Patient ambulated in hall approx 100 feet without o2 and use of one hand support to steady himself consistently on rails and chairs. When returned to sitting l2 sat monitored - reading on right hand 85% initially, after a minute left hand monitored and reported 92% - so questionable o2 sat readings. Patient did not complain of shortness of breath and reports not wanting o2 at home. Will continue POC   Follow Up Recommendations  No OT follow up    Equipment Recommendations  None recommended by OT    Recommendations for Other Services      Precautions / Restrictions Precautions Precautions: Fall Precaution Comments: monitor 02 sats       Mobility Bed Mobility Overal bed mobility: Independent       Supine to sit: Independent     General bed mobility comments: [place] performed independent bed mobilty.  Transfers   Equipment used: Conservation officer, nature (2 wheeled) Transfers: Sit to/from Stand Sit to Stand: Min guard         General transfer comment: Patient ambulated in room and in hall with min guard from therapist and patient stabilizing himself frequently with hand holds on furniture and rails. No loss of balance however. Patient'2 o2 sat 85% on right hand and 92% on left hand after returning to sitting on edge of bed - questionable  o2 sat results. Patient does not report shortness of breath and reports he is not going home with oxygen.    Balance Overall balance assessment: Needs assistance Sitting-balance support: No upper extremity supported;Feet supported Sitting balance-Leahy Scale: Good     Standing balance support: Single extremity supported Standing balance-Leahy Scale: Fair Standing balance comment: Ambulated in hall with frequent use of hand holds to steady himself.                           ADL either performed or assessed with clinical judgement   ADL                       Lower Body Dressing: Supervision/safety Lower Body Dressing Details (indicate cue type and reason): Patient donned pants, socks and shoes without assistance. Therapist provided supervision to monitor o2 sats and activity tolerance.                     Vision       Perception     Praxis      Cognition Arousal/Alertness: Awake/alert Behavior During Therapy: WFL for tasks assessed/performed Overall Cognitive Status: Within Functional Limits for tasks assessed                                          Exercises     Shoulder Instructions       General  Comments      Pertinent Vitals/ Pain          Home Living                                          Prior Functioning/Environment              Frequency  Min 2X/week        Progress Toward Goals  OT Goals(current goals can now be found in the care plan section)  Progress towards OT goals: Progressing toward goals  Acute Rehab OT Goals Patient Stated Goal: To improve endurance OT Goal Formulation: With patient Time For Goal Achievement: 06/19/20 Potential to Achieve Goals: Good  Plan Discharge plan remains appropriate    Co-evaluation                 AM-PAC OT "6 Clicks" Daily Activity     Outcome Measure   Help from another person eating meals?: None Help from another person  taking care of personal grooming?: None Help from another person toileting, which includes using toliet, bedpan, or urinal?: None Help from another person bathing (including washing, rinsing, drying)?: None   Help from another person to put on and taking off regular lower body clothing?: None 6 Click Score: 20    End of Session Equipment Utilized During Treatment: Gait belt  OT Visit Diagnosis: Muscle weakness (generalized) (M62.81)   Activity Tolerance Patient tolerated treatment well   Patient Left in bed;with call bell/phone within reach   Nurse Communication Mobility status (o2 sats)        Time: 0943-1000 OT Time Calculation (min): 17 min  Charges: OT General Charges $OT Visit: 1 Visit OT Treatments $Therapeutic Activity: 8-22 mins  [name] [name], OTR/L [place]  Office [phone] Pager: [place] 06/09/2020, 11:10 AM

## 2020-06-22 ENCOUNTER — Inpatient Hospital Stay (HOSPITAL_COMMUNITY)
Admission: EM | Admit: 2020-06-22 | Discharge: 2020-06-25 | DRG: 682 | Disposition: A | Discharge status: Home Health | Payer: Medicare HMO | Attending: Internal Medicine | Admitting: Internal Medicine

## 2020-06-22 ENCOUNTER — Other Ambulatory Visit: Payer: Self-pay

## 2020-06-22 ENCOUNTER — Emergency Department (HOSPITAL_COMMUNITY): Payer: Medicare HMO

## 2020-06-22 ENCOUNTER — Encounter (HOSPITAL_COMMUNITY): Payer: Self-pay

## 2020-06-22 DIAGNOSIS — I255 Ischemic cardiomyopathy: Secondary | ICD-10-CM | POA: Diagnosis present

## 2020-06-22 DIAGNOSIS — Z8249 Family history of ischemic heart disease and other diseases of the circulatory system: Secondary | ICD-10-CM

## 2020-06-22 DIAGNOSIS — Z9049 Acquired absence of other specified parts of digestive tract: Secondary | ICD-10-CM | POA: Diagnosis not present

## 2020-06-22 DIAGNOSIS — K2270 Barrett's esophagus without dysplasia: Secondary | ICD-10-CM | POA: Diagnosis present

## 2020-06-22 DIAGNOSIS — Z881 Allergy status to other antibiotic agents status: Secondary | ICD-10-CM

## 2020-06-22 DIAGNOSIS — Z87442 Personal history of urinary calculi: Secondary | ICD-10-CM

## 2020-06-22 DIAGNOSIS — Z20822 Contact with and (suspected) exposure to covid-19: Secondary | ICD-10-CM | POA: Diagnosis present

## 2020-06-22 DIAGNOSIS — N179 Acute kidney failure, unspecified: Secondary | ICD-10-CM | POA: Diagnosis not present

## 2020-06-22 DIAGNOSIS — Z79899 Other long term (current) drug therapy: Secondary | ICD-10-CM

## 2020-06-22 DIAGNOSIS — Z885 Allergy status to narcotic agent status: Secondary | ICD-10-CM

## 2020-06-22 DIAGNOSIS — I959 Hypotension, unspecified: Secondary | ICD-10-CM | POA: Diagnosis present

## 2020-06-22 DIAGNOSIS — R627 Adult failure to thrive: Secondary | ICD-10-CM | POA: Diagnosis present

## 2020-06-22 DIAGNOSIS — E785 Hyperlipidemia, unspecified: Secondary | ICD-10-CM | POA: Diagnosis present

## 2020-06-22 DIAGNOSIS — N1832 Chronic kidney disease, stage 3b: Secondary | ICD-10-CM | POA: Diagnosis not present

## 2020-06-22 DIAGNOSIS — K562 Volvulus: Secondary | ICD-10-CM | POA: Diagnosis not present

## 2020-06-22 DIAGNOSIS — C9210 Chronic myeloid leukemia, BCR/ABL-positive, not having achieved remission: Secondary | ICD-10-CM | POA: Diagnosis not present

## 2020-06-22 DIAGNOSIS — Z681 Body mass index (BMI) 19 or less, adult: Secondary | ICD-10-CM

## 2020-06-22 DIAGNOSIS — E872 Acidosis: Secondary | ICD-10-CM | POA: Diagnosis not present

## 2020-06-22 DIAGNOSIS — I5022 Chronic systolic (congestive) heart failure: Secondary | ICD-10-CM | POA: Diagnosis present

## 2020-06-22 DIAGNOSIS — K2960 Other gastritis without bleeding: Secondary | ICD-10-CM | POA: Diagnosis present

## 2020-06-22 DIAGNOSIS — I2510 Atherosclerotic heart disease of native coronary artery without angina pectoris: Secondary | ICD-10-CM | POA: Diagnosis present

## 2020-06-22 DIAGNOSIS — E871 Hypo-osmolality and hyponatremia: Secondary | ICD-10-CM | POA: Diagnosis not present

## 2020-06-22 DIAGNOSIS — D500 Iron deficiency anemia secondary to blood loss (chronic): Secondary | ICD-10-CM | POA: Diagnosis not present

## 2020-06-22 DIAGNOSIS — N189 Chronic kidney disease, unspecified: Secondary | ICD-10-CM

## 2020-06-22 DIAGNOSIS — E860 Dehydration: Secondary | ICD-10-CM | POA: Diagnosis present

## 2020-06-22 DIAGNOSIS — N183 Chronic kidney disease, stage 3 unspecified: Secondary | ICD-10-CM | POA: Diagnosis present

## 2020-06-22 DIAGNOSIS — K269 Duodenal ulcer, unspecified as acute or chronic, without hemorrhage or perforation: Secondary | ICD-10-CM | POA: Diagnosis present

## 2020-06-22 DIAGNOSIS — Z955 Presence of coronary angioplasty implant and graft: Secondary | ICD-10-CM

## 2020-06-22 DIAGNOSIS — D509 Iron deficiency anemia, unspecified: Secondary | ICD-10-CM | POA: Diagnosis present

## 2020-06-22 DIAGNOSIS — E43 Unspecified severe protein-calorie malnutrition: Secondary | ICD-10-CM | POA: Diagnosis present

## 2020-06-22 DIAGNOSIS — Q433 Congenital malformations of intestinal fixation: Secondary | ICD-10-CM | POA: Diagnosis not present

## 2020-06-22 DIAGNOSIS — B370 Candidal stomatitis: Secondary | ICD-10-CM | POA: Diagnosis present

## 2020-06-22 DIAGNOSIS — E875 Hyperkalemia: Secondary | ICD-10-CM | POA: Diagnosis not present

## 2020-06-22 DIAGNOSIS — Z7982 Long term (current) use of aspirin: Secondary | ICD-10-CM

## 2020-06-22 DIAGNOSIS — Z888 Allergy status to other drugs, medicaments and biological substances status: Secondary | ICD-10-CM

## 2020-06-22 LAB — CBC WITH DIFFERENTIAL/PLATELET
Abs Immature Granulocytes: 0.22 10*3/uL — ABNORMAL HIGH (ref 0.00–0.07)
Basophils Absolute: 0 10*3/uL (ref 0.0–0.1)
Basophils Relative: 0 %
Eosinophils Absolute: 0.3 10*3/uL (ref 0.0–0.5)
Eosinophils Relative: 1 %
HCT: 36.9 % — ABNORMAL LOW (ref 39.0–52.0)
Hemoglobin: 11.1 g/dL — ABNORMAL LOW (ref 13.0–17.0)
Immature Granulocytes: 1 %
Lymphocytes Relative: 5 %
Lymphs Abs: 1.2 10*3/uL (ref 0.7–4.0)
MCH: 27.5 pg (ref 26.0–34.0)
MCHC: 30.1 g/dL (ref 30.0–36.0)
MCV: 91.6 fL (ref 80.0–100.0)
Monocytes Absolute: 2.1 10*3/uL — ABNORMAL HIGH (ref 0.1–1.0)
Monocytes Relative: 9 %
Neutro Abs: 20.1 10*3/uL — ABNORMAL HIGH (ref 1.7–7.7)
Neutrophils Relative %: 84 %
Platelets: 329 10*3/uL (ref 150–400)
RBC: 4.03 MIL/uL — ABNORMAL LOW (ref 4.22–5.81)
RDW: 16.1 % — ABNORMAL HIGH (ref 11.5–15.5)
WBC: 23.9 10*3/uL — ABNORMAL HIGH (ref 4.0–10.5)
nRBC: 0 % (ref 0.0–0.2)

## 2020-06-22 LAB — COMPREHENSIVE METABOLIC PANEL
ALT: 14 U/L (ref 0–44)
AST: 13 U/L — ABNORMAL LOW (ref 15–41)
Albumin: 3 g/dL — ABNORMAL LOW (ref 3.5–5.0)
Alkaline Phosphatase: 107 U/L (ref 38–126)
Anion gap: 10 (ref 5–15)
BUN: 45 mg/dL — ABNORMAL HIGH (ref 8–23)
CO2: 20 mmol/L — ABNORMAL LOW (ref 22–32)
Calcium: 8.1 mg/dL — ABNORMAL LOW (ref 8.9–10.3)
Chloride: 102 mmol/L (ref 98–111)
Creatinine, Ser: 3.25 mg/dL — ABNORMAL HIGH (ref 0.61–1.24)
GFR calc Af Amer: 19 mL/min — ABNORMAL LOW (ref >60)
GFR calc non Af Amer: 16 mL/min — ABNORMAL LOW (ref >60)
Glucose, Bld: 102 mg/dL — ABNORMAL HIGH (ref 70–99)
Potassium: 5.6 mmol/L — ABNORMAL HIGH (ref 3.5–5.1)
Sodium: 132 mmol/L — ABNORMAL LOW (ref 135–145)
Total Bilirubin: 0.7 mg/dL (ref 0.3–1.2)
Total Protein: 7.5 g/dL (ref 6.5–8.1)

## 2020-06-22 LAB — BASIC METABOLIC PANEL
Anion gap: 7 (ref 5–15)
Anion gap: 9 (ref 5–15)
BUN: 44 mg/dL — ABNORMAL HIGH (ref 8–23)
BUN: 43 mg/dL — ABNORMAL HIGH (ref 8–23)
CO2: 20 mmol/L — ABNORMAL LOW (ref 22–32)
CO2: 18 mmol/L — ABNORMAL LOW (ref 22–32)
Calcium: 7.7 mg/dL — ABNORMAL LOW (ref 8.9–10.3)
Calcium: 7.7 mg/dL — ABNORMAL LOW (ref 8.9–10.3)
Chloride: 106 mmol/L (ref 98–111)
Chloride: 109 mmol/L (ref 98–111)
Creatinine, Ser: 2.94 mg/dL — ABNORMAL HIGH (ref 0.61–1.24)
Creatinine, Ser: 2.89 mg/dL — ABNORMAL HIGH (ref 0.61–1.24)
GFR calc Af Amer: 21 mL/min — ABNORMAL LOW (ref >60)
GFR calc Af Amer: 22 mL/min — ABNORMAL LOW (ref >60)
GFR calc non Af Amer: 18 mL/min — ABNORMAL LOW (ref >60)
GFR calc non Af Amer: 19 mL/min — ABNORMAL LOW (ref >60)
Glucose, Bld: 90 mg/dL (ref 70–99)
Glucose, Bld: 115 mg/dL — ABNORMAL HIGH (ref 70–99)
Potassium: 5.6 mmol/L — ABNORMAL HIGH (ref 3.5–5.1)
Potassium: 5.3 mmol/L — ABNORMAL HIGH (ref 3.5–5.1)
Sodium: 133 mmol/L — ABNORMAL LOW (ref 135–145)
Sodium: 136 mmol/L (ref 135–145)

## 2020-06-22 LAB — SARS CORONAVIRUS 2 BY RT PCR (HOSPITAL ORDER, PERFORMED IN ~~LOC~~ HOSPITAL LAB): SARS Coronavirus 2: NEGATIVE

## 2020-06-22 LAB — PROCALCITONIN: Procalcitonin: 0.24 ng/mL

## 2020-06-22 IMAGING — CT CT ABD-PELV W/O CM
2 of 4 series · 16 of 46 positions shown, 18 images · non-contrast
Comparison: [DATE]

CLINICAL DATA: Nausea and vomiting

EXAM:
CT ABDOMEN AND PELVIS WITHOUT CONTRAST
TECHNIQUE: Multidetector CT imaging of the abdomen and pelvis was performed
following the standard protocol without IV contrast.

[Series 2: axial st · axial · 0.70mm/px · z∈[-608,-214]mm · 13 of 89 slices shown, 15 images]
[im 5/89  soft-tissue]
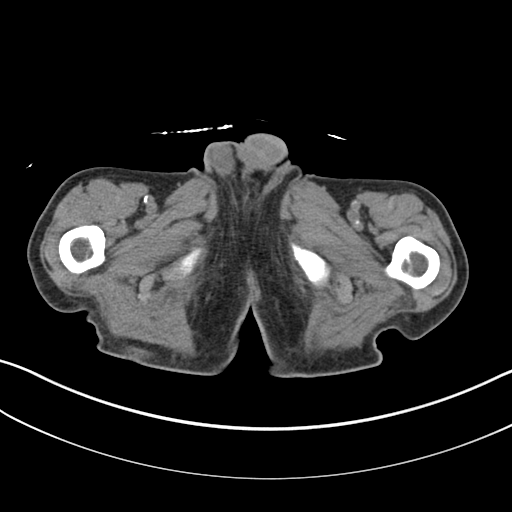
[im 5/89  bone]
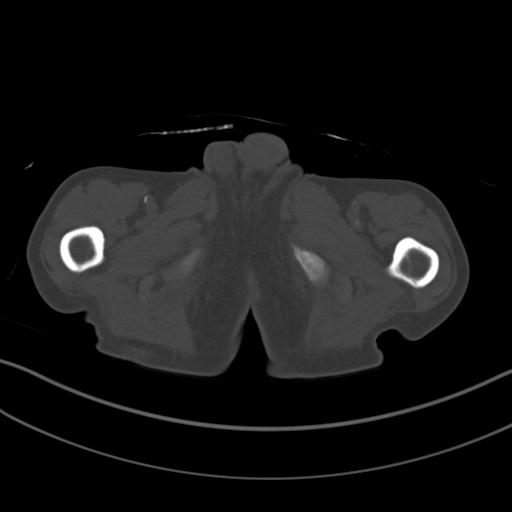
[im 14/89  soft-tissue]
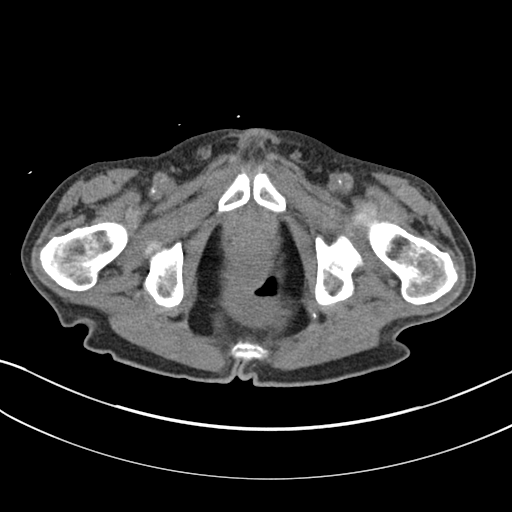
[im 18/89  soft-tissue]
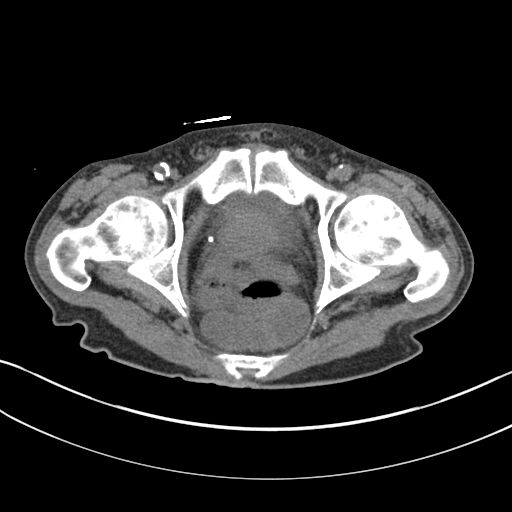
[im 27/89  soft-tissue]
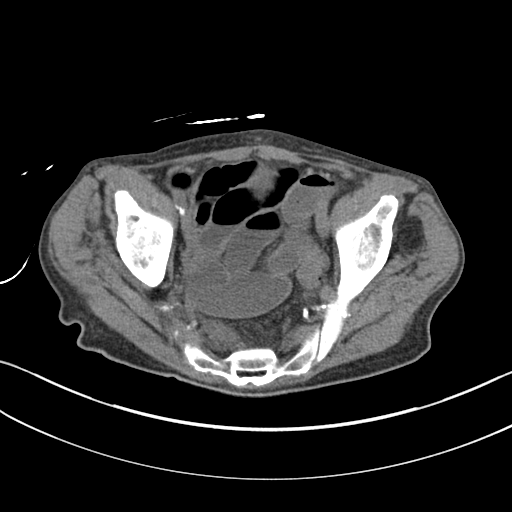
[im 31/89  soft-tissue]
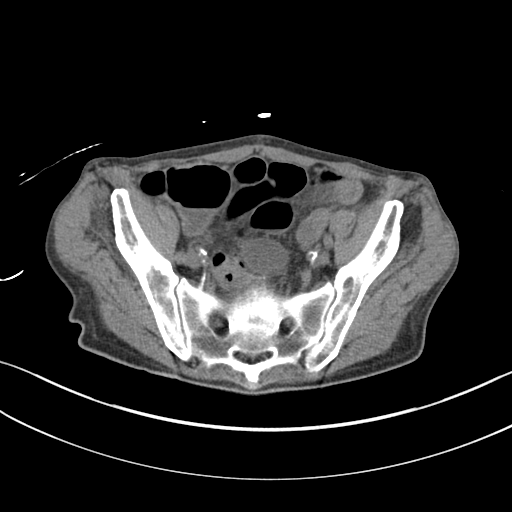
[im 40/89  soft-tissue]
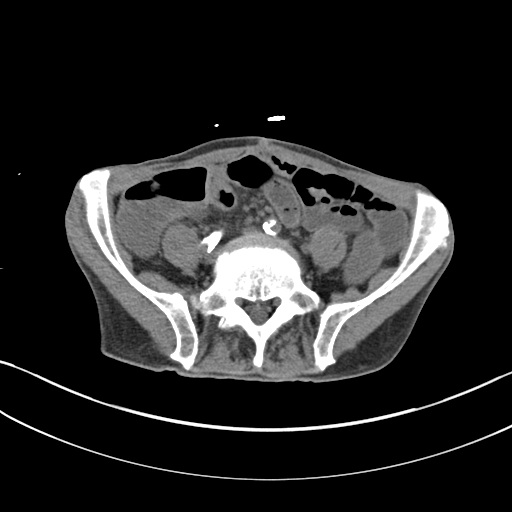
[im 45/89  soft-tissue]
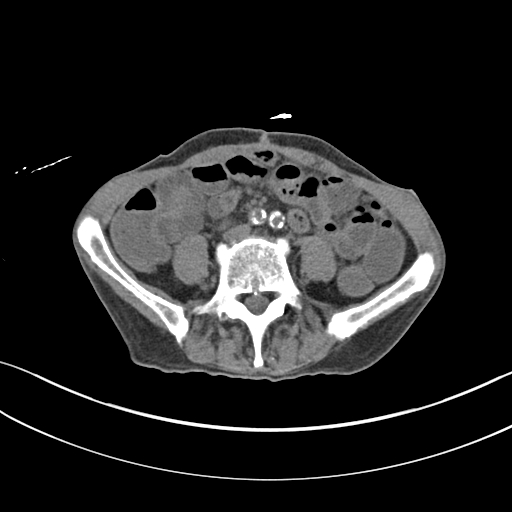
[im 49/89  soft-tissue]
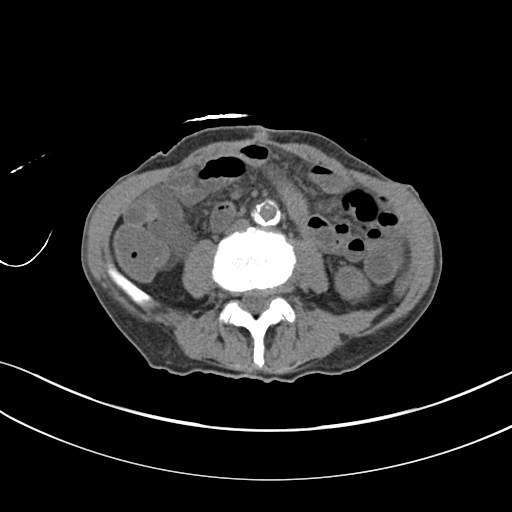
[im 58/89  soft-tissue]
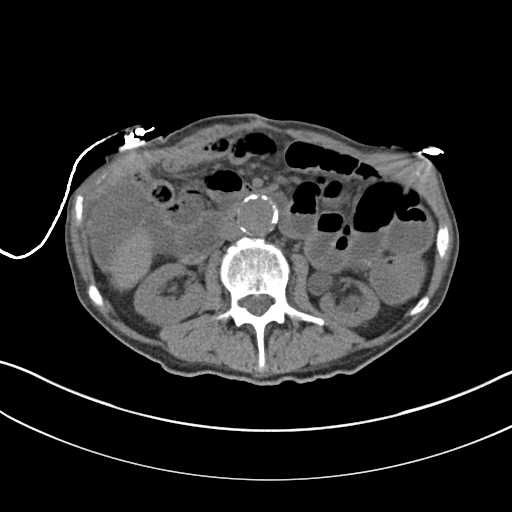
[im 58/89  bone]
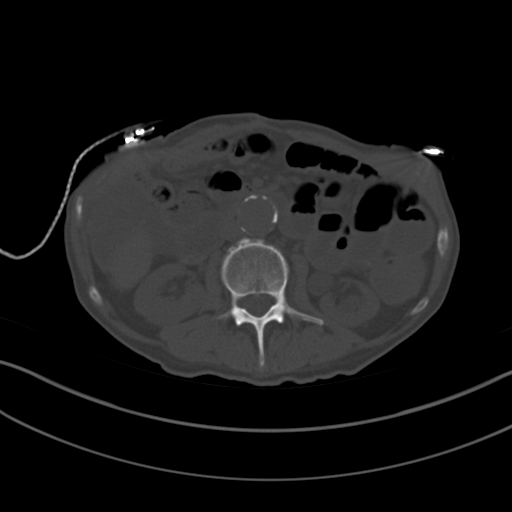
[im 62/89  soft-tissue]
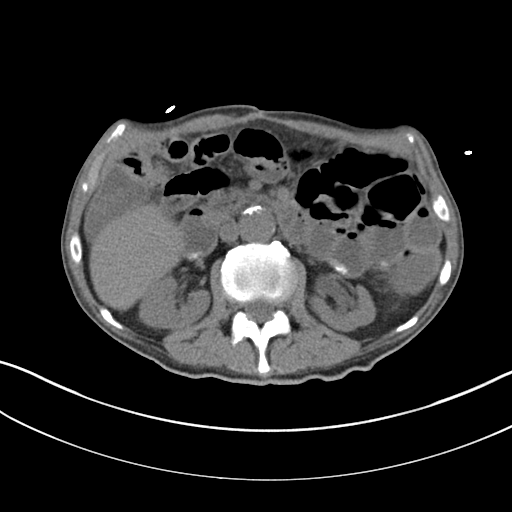
[im 71/89  soft-tissue]
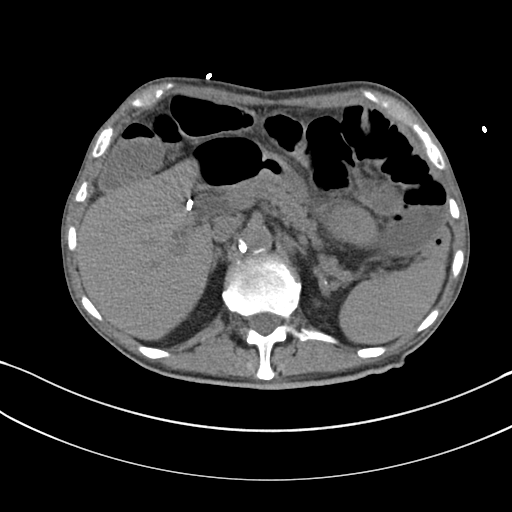
[im 75/89  soft-tissue]
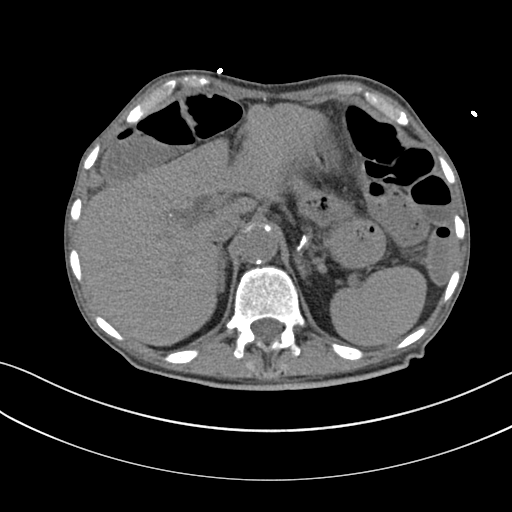
[im 84/89  soft-tissue]
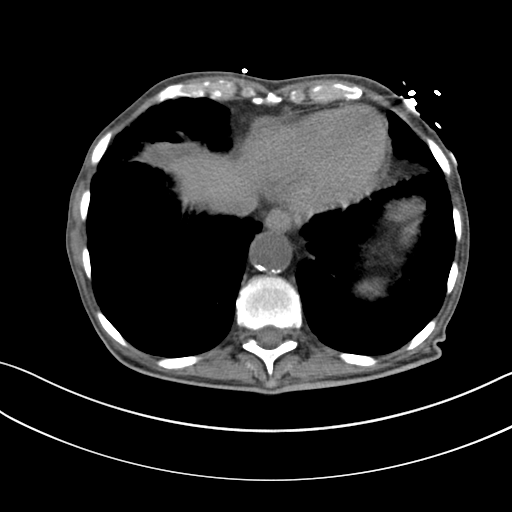

[Series 4: coronal st · coronal · 0.65mm/px · 3 of 117 slices shown]
[im 39/117  soft-tissue]
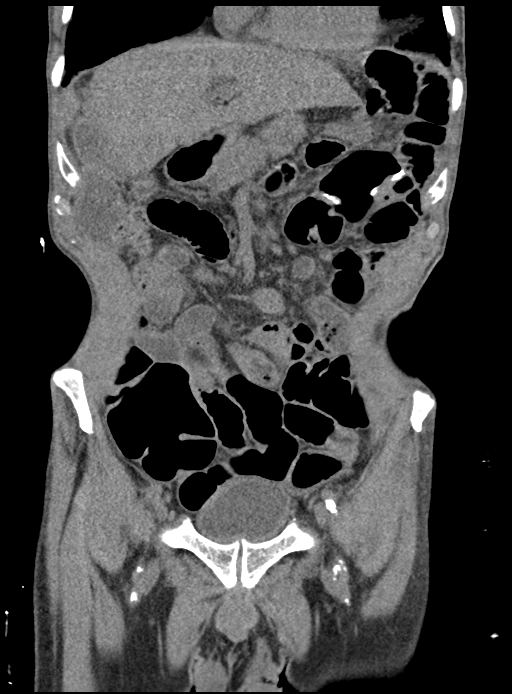
[im 52/117  soft-tissue]
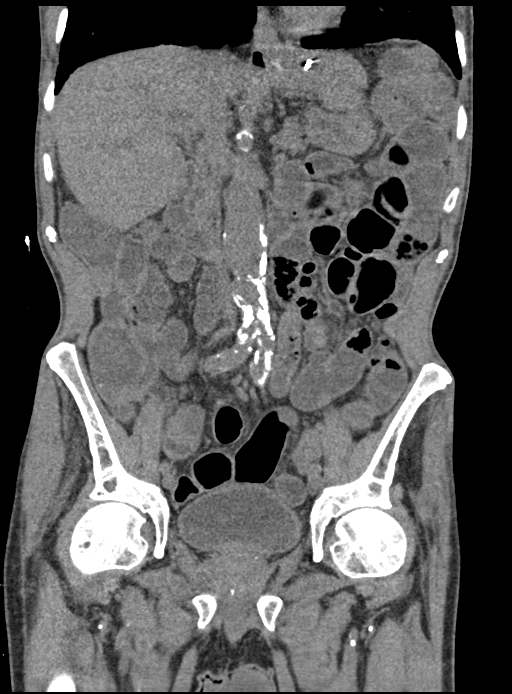
[im 65/117  soft-tissue]
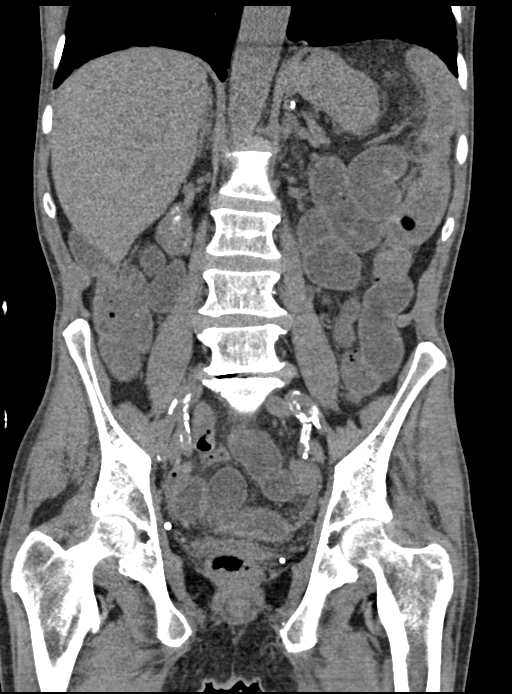

[16 of 46 positions shown; findings below may reference images not displayed]

FINDINGS: Lower chest: Emphysematous changes are noted. No focal infiltrate or
sizable effusion is seen.

Hepatobiliary: No focal liver abnormality is seen. Status post
cholecystectomy. No biliary dilatation.

Pancreas: Unremarkable. No pancreatic ductal dilatation or
surrounding inflammatory changes.

Spleen: Normal in size without focal abnormality.

Adrenals/Urinary Tract: Adrenal glands are within normal limits
bilaterally. Bilateral nonobstructing renal calculi are seen.
Prominent extrarenal pelvis is noted on the left. No obstructive
changes are seen. The bladder is partially distended.

Stomach/Bowel: Scattered diverticular change of the colon is noted
without evidence of diverticulitis. Some fluid is noted within the
large and small bowel likely related to a diarrheal state. No
obstructive changes are seen stomach appears within normal limits.

Vascular/Lymphatic: Aortic atherosclerosis. Stable aneurysmal
dilatation in the infrarenal aorta is noted. No enlarged abdominal
or pelvic lymph nodes.

Reproductive: Prostate is unremarkable.

Other: No abdominal wall hernia or abnormality. No abdominopelvic
ascites.

Musculoskeletal: Degenerative changes of lumbar spine are noted. No
acute abnormality is seen.
IMPRESSION: Bilateral nonobstructing renal calculi.

Diverticulosis without diverticulitis.

Mild fluid within the large and small bowel which may be related to
an underlying diarrheal state.

## 2020-06-22 IMAGING — CR DG ABDOMEN ACUTE W/ 1V CHEST
4 series · 4 of 4 positions shown · non-contrast
Comparison: [DATE], [82]

CLINICAL DATA: Vomiting

EXAM:
DG ABDOMEN ACUTE W/ 1V CHEST

[x abdomen supine]
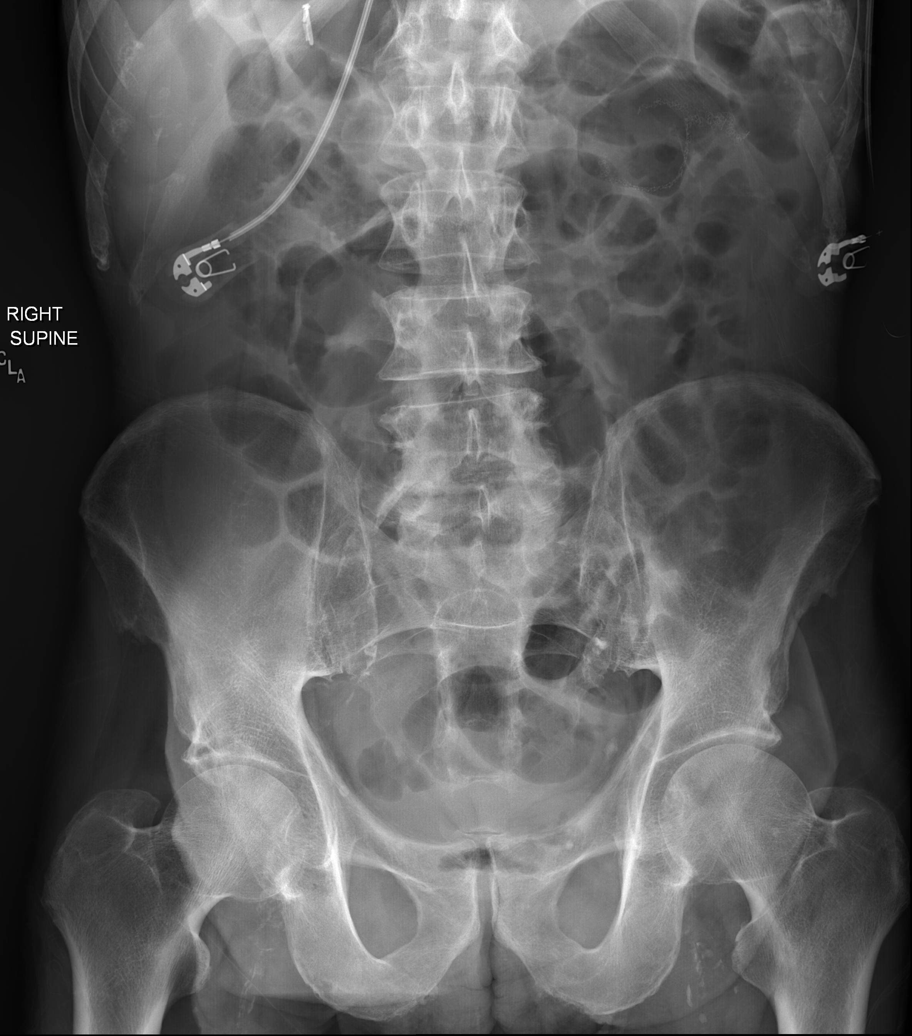

[x chest ap]
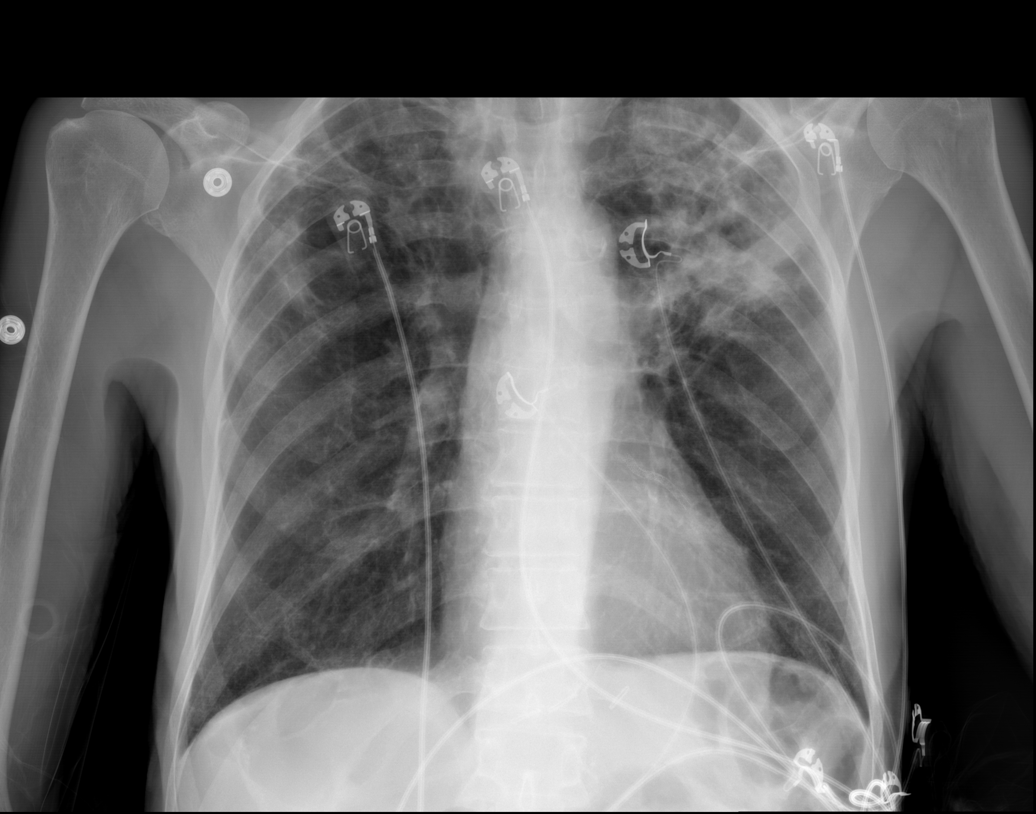

[w abdomen decub (1 of 2)]
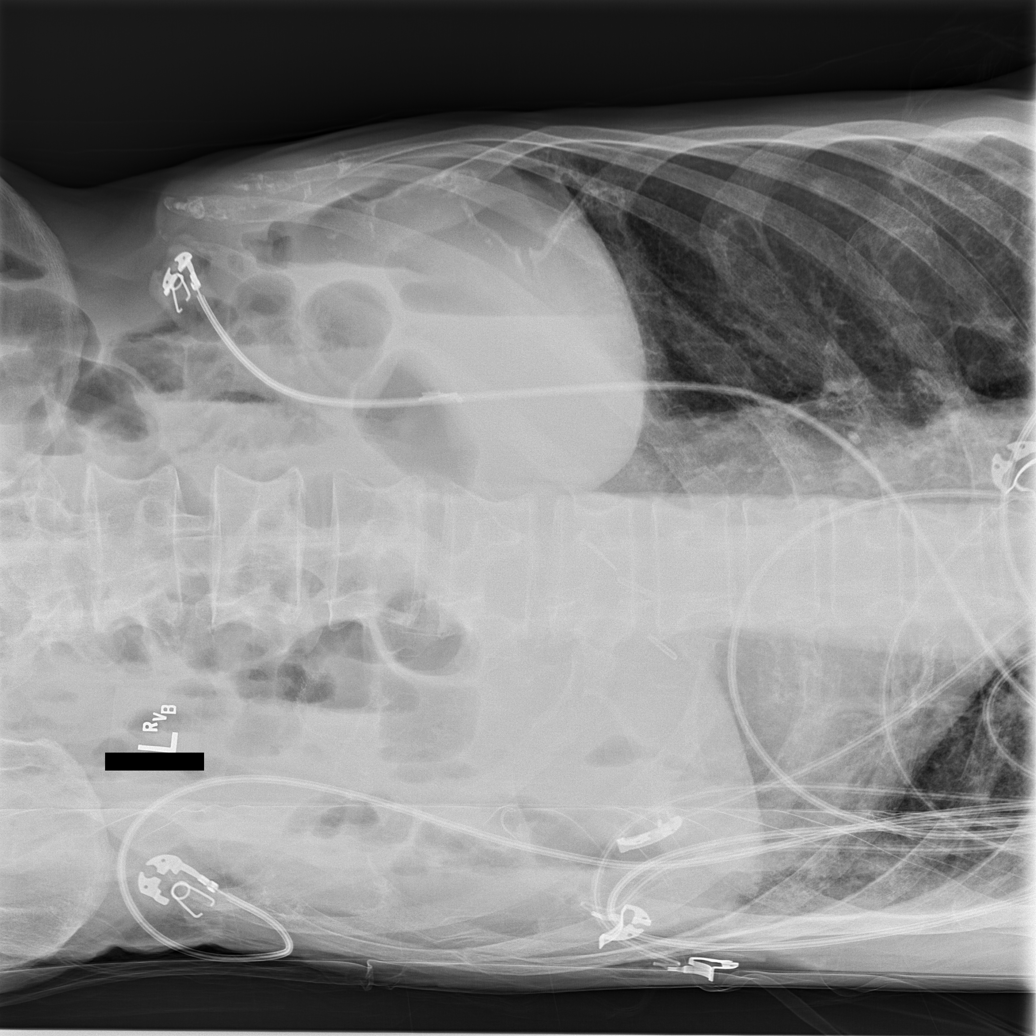

[w abdomen decub (2 of 2)]
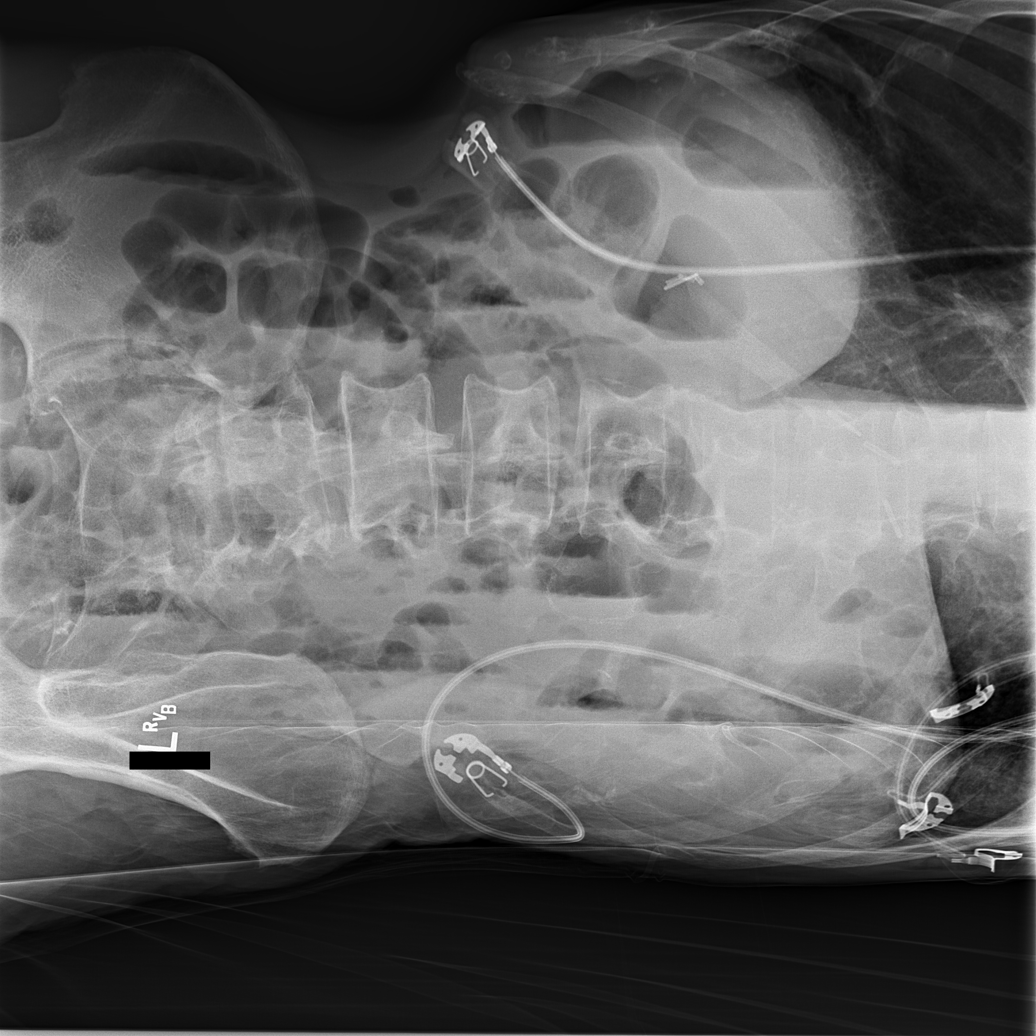

[4 of 4 positions shown; findings below may reference images not displayed]

FINDINGS: Diffuse gaseous distention of bowel without dilation. Air is
visualized in the rectum. No free air. Nonspecific scattered
air-fluid levels.

Unchanged cardiomediastinal silhouette. Emphysematous changes with
LEFT greater than RIGHT upper lobe heterogeneous opacities, mildly
increased in comparison to prior. No pleural effusion or
pneumothorax.

Surgical sutures in the LEFT upper quadrant. Vascular
calcifications. Status post cholecystectomy. Degenerative changes of
the lower lumbar spine with transitional anatomy of L5-S1 with
bilateral assimilation joints.
IMPRESSION: 1. Diffuse gaseous distention of bowel without dilation or free air.
Extent of distension is similar in comparison to [DATE]. This
may reflect a nonspecific gastroenteritis. If persistent concern for
obstruction, recommend dedicated CT.
2. Mildly increased bilateral upper lobe heterogeneous opacities may
reflect increased infection or inflammation. Recommend follow-up
radiograph in 4-6 weeks to assess for resolution.

## 2020-06-22 MED ORDER — FAMOTIDINE 20 MG PO TABS: 20.0000 mg | ORAL_TABLET | ORAL | Status: DC | PRN

## 2020-06-22 MED ORDER — FERROUS SULFATE 325 (65 FE) MG PO TABS
325.0000 mg | ORAL_TABLET | Freq: Every day | ORAL | Status: DC
  Administered 2020-06-23 – 2020-06-24 (×2): 325 mg via ORAL
  Filled 2020-06-22 (×2): qty 1

## 2020-06-22 MED ORDER — VITAMIN B-12 1000 MCG PO TABS
1000.0000 ug | ORAL_TABLET | Freq: Every day | ORAL | Status: DC
  Administered 2020-06-22 – 2020-06-25 (×4): 1000 ug via ORAL
  Filled 2020-06-22 (×4): qty 1

## 2020-06-22 MED ORDER — ONDANSETRON HCL 4 MG PO TABS: 4.0000 mg | ORAL_TABLET | Freq: Four times a day (QID) | ORAL | Status: DC | PRN

## 2020-06-22 MED ORDER — ACETAMINOPHEN 650 MG RE SUPP: 650.0000 mg | Freq: Four times a day (QID) | RECTAL | Status: DC | PRN

## 2020-06-22 MED ORDER — DIPHENHYDRAMINE HCL 25 MG PO CAPS
25.0000 mg | ORAL_CAPSULE | Freq: Every evening | ORAL | Status: DC | PRN
  Administered 2020-06-22 – 2020-06-24 (×3): 25 mg via ORAL
  Filled 2020-06-22 (×3): qty 1

## 2020-06-22 MED ORDER — SIMETHICONE 80 MG PO CHEW
80.0000 mg | CHEWABLE_TABLET | Freq: Four times a day (QID) | ORAL | Status: DC | PRN
  Filled 2020-06-22: qty 1

## 2020-06-22 MED ORDER — ACETAMINOPHEN 500 MG PO TABS
500.0000 mg | ORAL_TABLET | Freq: Every evening | ORAL | Status: DC | PRN
  Administered 2020-06-22 – 2020-06-24 (×3): 500 mg via ORAL
  Filled 2020-06-22 (×3): qty 1

## 2020-06-22 MED ORDER — SODIUM CHLORIDE 0.9 % IV BOLUS
500.0000 mL | Freq: Once | INTRAVENOUS | Status: AC
  Administered 2020-06-22: 500 mL via INTRAVENOUS

## 2020-06-22 MED ORDER — BOSUTINIB 400 MG PO TABS: 400.0000 mg | ORAL_TABLET | Freq: Every day | ORAL | Status: DC

## 2020-06-22 MED ORDER — NYSTATIN 100000 UNIT/ML MT SUSP
5.0000 mL | Freq: Four times a day (QID) | OROMUCOSAL | Status: DC
  Administered 2020-06-22 – 2020-06-25 (×11): 500000 [IU] via ORAL
  Filled 2020-06-22 (×12): qty 5

## 2020-06-22 MED ORDER — ACETAMINOPHEN 325 MG PO TABS
650.0000 mg | ORAL_TABLET | Freq: Four times a day (QID) | ORAL | Status: DC | PRN
  Administered 2020-06-23: 650 mg via ORAL
  Filled 2020-06-22: qty 2

## 2020-06-22 MED ORDER — SODIUM CHLORIDE 0.9 % IV SOLN: INTRAVENOUS | Status: DC

## 2020-06-22 MED ORDER — ONDANSETRON HCL 4 MG/2ML IJ SOLN
4.0000 mg | Freq: Four times a day (QID) | INTRAMUSCULAR | Status: DC | PRN
  Administered 2020-06-24: 4 mg via INTRAVENOUS
  Filled 2020-06-22: qty 2

## 2020-06-22 MED ORDER — ASPIRIN EC 81 MG PO TBEC
81.0000 mg | DELAYED_RELEASE_TABLET | Freq: Every day | ORAL | Status: DC
  Administered 2020-06-23 – 2020-06-25 (×3): 81 mg via ORAL
  Filled 2020-06-22 (×3): qty 1

## 2020-06-22 MED ORDER — LACTATED RINGERS IV SOLN: INTRAVENOUS | Status: DC

## 2020-06-22 MED ORDER — ENSURE ENLIVE PO LIQD
237.0000 mL | Freq: Two times a day (BID) | ORAL | Status: DC
  Administered 2020-06-24 (×2): 237 mL via ORAL

## 2020-06-22 MED ORDER — EZETIMIBE 10 MG PO TABS
10.0000 mg | ORAL_TABLET | Freq: Every day | ORAL | Status: DC
  Administered 2020-06-22 – 2020-06-25 (×4): 10 mg via ORAL
  Filled 2020-06-22 (×4): qty 1

## 2020-06-22 MED ORDER — ENOXAPARIN SODIUM 30 MG/0.3ML ~~LOC~~ SOLN
30.0000 mg | SUBCUTANEOUS | Status: DC
  Administered 2020-06-22 – 2020-06-24 (×3): 30 mg via SUBCUTANEOUS
  Filled 2020-06-22 (×3): qty 0.3

## 2020-06-22 MED ORDER — ASCORBIC ACID 500 MG PO TABS
500.0000 mg | ORAL_TABLET | Freq: Every day | ORAL | Status: DC
  Administered 2020-06-23 – 2020-06-25 (×3): 500 mg via ORAL
  Filled 2020-06-22 (×5): qty 1

## 2020-06-22 NOTE — H&P (Signed)
History and Physical    [name] [name] QQI:[id number] DOB: [birth year] DOA: 06/22/2020  PCP: Drake Leach, MD   Patient coming from: Home. Lives by himself active with home health  Chief Complaint  Patient presents with   Stool Color Change     HPI: [name] [name] is a 86 y.o. male with medical history significant for CML, CKD stage III, CAD status post stent, chronic systolic CHF with EF 94%/RDEYCXKG cardiomyopathy, per lipidemia, history of a small bowel obstruction/volvulus-multiple surgeries comes to the ED for evaluation of fatigue, diarrhea. He was recently admitted in the hospital and treated for an CKD, pneumonia, diarrhea/vomiting negative for CAD FOBT positive, GI bleed underwent EGD showing Barrett's esophagus erosive gastritis shallow ulcer in the stomach and nonbleeding duodenal ulcers. He finished abx from his last dschcarge on 06/21/20 and also had methylprednisolone dose pack and finished yesterday. He has been taking his lasix and other meds regularly. He has had watery stool since yesterday at least twice with associated nausea, low appetite, and he is getting over his yeast infection in his mouth. He gets fluid built up if he does not take it.  Patient otherwise denies any chest pain,shortness of breath,fever,chills,headache,focal weakness, numbness tingling, speech difficulties, dysurea or frequency.He has had felt occasional left chest pain when taking deep breath.  ED Course: BP 108-110s, heart rate is stable saturating well on room air, blood work shows hyponatremia 132 hyperkalemia 5.6, AKI 3.2 creatinine BUN 45, procalcitonin less than 0.1, leukocytosis 23.9 hemoglobin 11.1 platelet 329.  X-ray abdomen diffuse gaseous distention of bowel without dilation or free air similar in finding compared with x-ray in July 12.  Mildly increased bilateral upper lobe heterogeneous opacity.  CT abdomen and pelvis without contrast that showed "Mild fluid within the large and small bowel which may  be related to an underlying diarrheal state."  Patient was given half liter normal saline bolus and started on gentle fluid hydration. His fobt was neg, covid 19 was neg, and admission was requested for further management  Review of Systems: All systems were reviewed and were negative except as mentioned in HPI above. Negative for fever Negative for cough Negative for shortness of breath  Past Medical History:  Diagnosis Date   Carotid artery occlusion    Chronic systolic CHF (congestive heart failure) ([place])    a. 07/2013 Echo: EF 35-40%   CKD (chronic kidney disease), stage III    pt. states that he does not have CKD   CML (chronic myelocytic leukemia) ([place])    Complication of anesthesia    Pt states he has a hard time waking up feels like he cant breathe and esophagus is closing up    Coronary artery disease    a. 09/2013 Cath: LM min irregs, LAD 95p(2.75x20 Promus Premier DES & 3.0x20 Promus Premier DES overlapping), D1 50-60, LCX 30p, OM1 24m, OM2 60-70p, RCA small, nl.   Diverticulitis    Dysphonia 01/08/2017   History of hiatal hernia    History of kidney stones    History of small bowel obstruction    Hyperlipidemia    denies   Ischemic cardiomyopathy    a. 07/2013 Echo: EF 35-40%, apical septal, apical lat, apical AK, mod MR.   Small bowel perforation ([place])    a. 11/2012 s/p emergent SB resection.   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    Past Surgical History:  Procedure Laterality Date   BIOPSY  06/03/2020   Procedure: BIOPSY;  Surgeon: Jerene Bears, MD;  Location:  [facility];  Service: Endoscopy;;   BOWEL RESECTION     3cm sm intestine   CARDIAC CATHETERIZATION  10/09/2013   CAROTID ENDARTERECTOMY     CHOLECYSTECTOMY     COLON SURGERY  11/2012   BOWEL SURGERY    CORONARY ANGIOPLASTY  10/09/2013   MID LAD   ENDARTERECTOMY Right 09/17/2015   Procedure: Right Carotid ENDARTERECTOMY with Xenosure Patch Angioplasty;  Surgeon: Serafina Mitchell, MD;   Location: [facility] OR;  Service: Vascular;  Laterality: Right;   ESOPHAGOGASTRODUODENOSCOPY (EGD) WITH PROPOFOL N/A 06/03/2020   Procedure: ESOPHAGOGASTRODUODENOSCOPY (EGD) WITH PROPOFOL;  Surgeon: Jerene Bears, MD;  Location: [facility];  Service: Endoscopy;  Laterality: N/A;   EYE SURGERY Bilateral    INGUINAL HERNIA REPAIR Bilateral    LAPAROSCOPIC CHOLECYSTECTOMY     LAPAROTOMY N/A 10/14/2017   Procedure: EXPLORATORY LAPAROTOMY for SMALL BOWEL OBSTRUCTION, cecopexy;  Surgeon: Excell Seltzer, MD;  Location: [facility] OR[facility];  Service: General;  Laterality: N/A;   LAPAROTOMY N/A 11/09/2017   Procedure: EXPLORATORY LAPAROTOMY reduction small bowel valvulous;  Surgeon: Rolm Bookbinder, MD;  Location: [facility];  Service: General;  Laterality: N/A;   LAPAROTOMY N/A 01/10/2018   Procedure: exploratory laparotomy, lysis of adhesions, decompression of midgut volvulus;  Surgeon: Jovita Kussmaul, MD;  Location: [facility];  Service: General;  Laterality: N/A;   LAPAROTOMY N/A 08/21/2018   Procedure: EXPLORATORY LAPAROTOMY;  Surgeon: Excell Seltzer, MD;  Location: [facility];  Service: General;  Laterality: N/A;   LEFT HEART CATHETERIZATION WITH CORONARY ANGIOGRAM N/A 10/09/2013   Procedure: LEFT HEART CATHETERIZATION WITH CORONARY ANGIOGRAM;  Surgeon: Larey Dresser, MD;  Location: [facility] LAB;  Service: Cardiovascular;  Laterality: N/A;   PERCUTANEOUS CORONARY STENT INTERVENTION (PCI-S)  10/09/2013   Procedure: PERCUTANEOUS CORONARY STENT INTERVENTION (PCI-S);  Surgeon: Larey Dresser, MD;  Location: [facility] CATH LAB;  Service: Cardiovascular;;     reports that he quit smoking about 21 years ago. His smoking use included cigarettes. He has a 45.00 pack-year smoking history. He has never used smokeless tobacco. He reports that he does not drink alcohol and does not use drugs.  Allergies  Allergen Reactions   Amoxicillin Nausea Only   Crestor [Rosuvastatin] Itching   Percocet [Oxycodone-Acetaminophen]  Itching   Spironolactone Other (See Comments)    Nipples sore and swelling    Metronidazole Other (See Comments)    Insomnia and nervousness    Family History  Problem Relation Age of Onset   Emphysema Father        died @ 29- smoked   Heart disease Father    Kidney Stones Father    Diabetes Mother        died @ 98   Stroke Mother    Head & neck cancer Brother    Lymphoma Brother    Colon cancer Neg Hx    Stomach cancer Neg Hx    Rectal cancer Neg Hx    Esophageal cancer Neg Hx    Liver cancer Neg Hx      Prior to Admission medications   Medication Sig Start Date End Date Taking? Authorizing Provider  acetaminophen (TYLENOL) 325 MG tablet Take 650 mg by mouth every 6 (six) hours as needed for mild pain or headache.   Yes [provider]  Ascorbic Acid (VITAMIN C) 500 MG CAPS Take 500 mg by mouth daily.    Yes [provider]  aspirin EC 81 MG tablet Take 1 tablet (81 mg total) by mouth daily. 10/02/13  Yes Larey Dresser, MD  BOSULIF 400 MG tablet Take 400 mg by mouth daily with breakfast.   Yes [provider]  carvedilol (COREG) 12.5 MG tablet TAKE ONE TABLET BY MOUTH TWICE A DAY Patient taking differently: Take 12.5 mg by mouth 2 (two) times daily with a meal.  05/21/20  Yes Larey Dresser, MD  clotrimazole (MYCELEX) 10 MG troche Take 10 mg by mouth 5 (five) times daily. 10 day supply 06/18/20  Yes [provider]  cyanocobalamin 1000 MCG tablet Take 1,000 mcg by mouth daily.   Yes [provider]  diclofenac Sodium (VOLTAREN) 1 % GEL Apply 2 g topically 4 (four) times daily as needed (pain).   Yes [provider]  diphenhydramine-acetaminophen (TYLENOL PM) 25-500 MG TABS tablet Take 2 tablets by mouth at bedtime as needed (sleep).    Yes [provider]  ezetimibe (ZETIA) 10 MG tablet Take 1 tablet (10 mg total) by mouth daily. 04/09/20  Yes Larey Dresser, MD  famotidine (PEPCID) 20 MG tablet  Take 20 mg by mouth as needed for heartburn or indigestion.    Yes [provider]  Ferrous Sulfate (IRON) 325 (65 Fe) MG TABS Take 325 mg by mouth daily.    Yes [provider]  furosemide (LASIX) 20 MG tablet Take 1 tablet (20 mg total) by mouth See admin instructions. Take 20 mg daily for 3 days as needed if  weight gain of 3 lbs in a week or 2 lbs in 24 hrs. Patient taking differently: Take 10 mg by mouth daily.  06/09/20 07/09/20 Yes Shahmehdi, Valeria Batman, MD  guaiFENesin-dextromethorphan (ROBITUSSIN DM) 100-10 MG/5ML syrup Take 5 mLs by mouth every 4 (four) hours as needed for cough. 06/09/20  Yes Shahmehdi, Seyed A, MD  hydrocortisone cream 1 % Apply 1 application topically 2 (two) times daily as needed for itching.   Yes [provider]  loperamide (IMODIUM) 2 MG capsule Take 2 mg by mouth as needed for diarrhea or loose stools.    Yes [provider]  Simethicone (GAS-X PO) Take 1 tablet by mouth daily as needed (gas).    Yes [provider]  levofloxacin (LEVAQUIN) 750 MG tablet Take 1 tablet (750 mg total) by mouth every other day for 7 doses. 06/09/20 06/22/20  Deatra James, MD  methylPREDNISolone (MEDROL DOSEPAK) 4 MG TBPK tablet Medrol Dosepak take as instructed Patient not taking: Reported on 06/22/2020 06/09/20   Deatra James, MD    Physical Exam: Vitals:   06/22/20 0845 06/22/20 0918 06/22/20 1100 06/22/20 1330  BP: (!) 108/54  (!) 114/52 (!) 110/57  Pulse: 68  66 68  Resp: 18  17 19   Temp:  98.1 F (36.7 C)    SpO2: 97%  98% 97%  Weight:      Height:        General exam: AAOx3 , NAD, weak appearing. HEENT:Oral mucosa DRY, Ear/Nose WNL grossly, dentition normal. Respiratory system: bilaterally clear, upper lobe crackles,no wheezing or crackles,no use of accessory muscle Cardiovascular system: S1 & S2 +, No JVD,. Gastrointestinal system: Abdomen soft, NT,ND, BS+ Nervous System:Alert, awake, moving extremities and grossly  nonfocal Extremities: No edema, distal peripheral pulses palpable.  Skin: No rashes,no icterus. MSK: Normal muscle bulk,tone, power   Labs on Admission: I have personally reviewed following labs and imaging studies  CBC: Recent Labs  Lab 06/22/20 0850  WBC 23.9*  NEUTROABS 20.1*  HGB 11.1*  HCT 36.9*  MCV 91.6  PLT 001   Basic Metabolic Panel: Recent Labs  Lab 06/22/20 0850 06/22/20 1504  NA 132* 133*  K 5.6* 5.6*  CL 102 106  CO2 20* 20*  GLUCOSE 102* 90  BUN 45* 44*  CREATININE 3.25* 2.94*  CALCIUM 8.1* 7.7*   GFR: Estimated Creatinine Clearance: 13.1 mL/min (A) (by C-G formula based on SCr of 2.94 mg/dL (H)). Liver Function Tests: Recent Labs  Lab 06/22/20 0850  AST 13*  ALT 14  ALKPHOS 107  BILITOT 0.7  PROT 7.5  ALBUMIN 3.0*   No results for input(s): LIPASE, AMYLASE in the last 168 hours. No results for input(s): AMMONIA in the last 168 hours. Coagulation Profile: No results for input(s): INR, PROTIME in the last 168 hours. Cardiac Enzymes: No results for input(s): CKTOTAL, CKMB, CKMBINDEX, TROPONINI in the last 168 hours. BNP (last 3 results) No results for input(s): PROBNP in the last 8760 hours. HbA1C: No results for input(s): HGBA1C in the last 72 hours. CBG: No results for input(s): GLUCAP in the last 168 hours. Lipid Profile: No results for input(s): CHOL, HDL, LDLCALC, TRIG, CHOLHDL, LDLDIRECT in the last 72 hours. Thyroid Function Tests: No results for input(s): TSH, T4TOTAL, FREET4, T3FREE, THYROIDAB in the last 72 hours. Anemia Panel: No results for input(s): VITAMINB12, FOLATE, FERRITIN, TIBC, IRON, RETICCTPCT in the last 72 hours. Urine analysis:    Component Value Date/Time   COLORURINE YELLOW 05/31/2020 1405   APPEARANCEUR CLEAR 05/31/2020 1405   LABSPEC >1.030 (H) 05/31/2020 1405   PHURINE 5.0 05/31/2020 1405   GLUCOSEU NEGATIVE 05/31/2020 1405   HGBUR SMALL (A) 05/31/2020 1405   BILIRUBINUR NEGATIVE 05/31/2020 1405    KETONESUR NEGATIVE 05/31/2020 1405   PROTEINUR 30 (A) 05/31/2020 1405   UROBILINOGEN 0.2 09/06/2015 1935   NITRITE NEGATIVE 05/31/2020 1405   LEUKOCYTESUR NEGATIVE 05/31/2020 1405    Radiological Exams on Admission: CT ABDOMEN PELVIS WO CONTRAST  Result Date: 06/22/2020 CLINICAL DATA:  Nausea and vomiting EXAM: CT ABDOMEN AND PELVIS WITHOUT CONTRAST TECHNIQUE: Multidetector CT imaging of the abdomen and pelvis was performed following the standard protocol without IV contrast. COMPARISON:  05/31/2020 FINDINGS: Lower chest: Emphysematous changes are noted. No focal infiltrate or sizable effusion is seen. Hepatobiliary: No focal liver abnormality is seen. Status post cholecystectomy. No biliary dilatation. Pancreas: Unremarkable. No pancreatic ductal dilatation or surrounding inflammatory changes. Spleen: Normal in size without focal abnormality. Adrenals/Urinary Tract: Adrenal glands are within normal limits bilaterally. Bilateral nonobstructing renal calculi are seen. Prominent extrarenal pelvis is noted on the left. No obstructive changes are seen. The bladder is partially distended. Stomach/Bowel: Scattered diverticular change of the colon is noted without evidence of diverticulitis. Some fluid is noted within the large and small bowel likely related to a diarrheal state. No obstructive changes are seen stomach appears within normal limits. Vascular/Lymphatic: Aortic atherosclerosis. 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 No enlarged abdominal or pelvic lymph nodes. Reproductive: Prostate is unremarkable. Other: No abdominal wall hernia or abnormality. No abdominopelvic ascites. Musculoskeletal: Degenerative changes of lumbar spine are noted. No acute abnormality is seen. IMPRESSION: Bilateral nonobstructing renal calculi. Diverticulosis without diverticulitis. Mild fluid within the large and small bowel which may be related to an underlying diarrheal state. Electronically Signed    By: Inez Catalina M.D.   On: 06/22/2020 13:32   DG ABD ACUTE 2+V W 1V CHEST  Result Date: 06/22/2020 CLINICAL DATA:  Vomiting EXAM: DG ABDOMEN ACUTE W/ 1V CHEST COMPARISON:  July 12-30, 2021 FINDINGS: Diffuse gaseous  distention of bowel without dilation. Air is visualized in the rectum. No free air. Nonspecific scattered air-fluid levels. Unchanged cardiomediastinal silhouette. Emphysematous changes with LEFT greater than RIGHT upper lobe heterogeneous opacities, mildly increased in comparison to prior. No pleural effusion or pneumothorax. Surgical sutures in the LEFT upper quadrant. Vascular calcifications. Status post cholecystectomy. Degenerative changes of the lower lumbar spine with transitional anatomy of L5-S1 with bilateral assimilation joints. IMPRESSION: 1. Diffuse gaseous distention of bowel without dilation or free air. Extent of distension is similar in comparison to May 31, 2020. This may reflect a nonspecific gastroenteritis. If persistent concern for obstruction, recommend dedicated CT. 2. Mildly increased bilateral upper lobe heterogeneous opacities may reflect increased infection or inflammation. Recommend follow-up radiograph in 4-6 weeks to assess for resolution. Electronically Signed   By: Valentino Saxon MD   On: 06/22/2020 10:05     Assessment/Plan  AKI on CKD stage IIIb baseline creatinine on discharge was 1.6  On 7/18.  Suspect from poor intake recent hospitalization deconditioning with diarrhea Lasix use.  We will hold Lasix keep on gentle hydration, creatinine downtrending.  Still appears very dry.  He has history of CHF so we will need to watch for fluid overload educated patient to notify us if he has any leg edema or any kind of shortness of breath or cough.  Hyperkalemia mild, potassium remains at 5.6 on rechecke, likely from AKI, continue gentle hydration change Ringer's lactate to normal saline, repeat BMP later tonight.  Leukocytosis: Unclear etiology given his  diarrhea will check for C. difficile.  Patient recently was treated with antibiotics for his pneumonia.On the CT scan lower chest emphysematous changes noted no focal infiltrate or sizable effusion seen. Xray abd-mildly increased bilateral upper lobe heterogeneous opacities present opacity in the left side seems to be improving.procal is ordered and if high we can consider abx, if otherwise monitor.procalcitonin on 7/21 was < 0.01, Cbc in am.  Wonder if CML is contributing to his leukocytosis.  Hyponatremia/metabolic acidosis/dehydration keep on IV fluid hydration as #1.  Diarrhea: Given his recent antibiotic use we will check for C. difficile.  He had similar presentation previously.  Chronic systolic CHF : Currently dehydrated and holding Lasix.  CAD/Ischemic cardiomyopathy: Continue his home medication. 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  Continue the same.  CML resume his treatment and he needs to bring the medication.son is aware,  Failure to thrive/debility/deconditioning: PT OT eval.  Body mass index is 16.98 kg/m.   Severity of Illness: * I certify that at the point of admission it is my clinical judgment that the patient will require inpatient hospital care spanning beyond 2 midnights from the point of admission due to high intensity of service, high risk for further deterioration and high frequency of surveillance required.*    DVT prophylaxis: enoxaparin (LOVENOX) injection 40 mg Start: 06/22/20 1630 Code Status:   Code Status: Full Code Family Communication: Admission, patients condition and plan of care including tests being ordered have been discussed with the patient and his son at bedside who indicate understanding and agree with the plan and Code Status.  Consults called:  None  Antonieta Pert MD [facility]  If 7PM-7AM, please  contact night-coverage [email or web address]  06/22/2020, 4:26 PM

## 2020-06-22 NOTE — ED Notes (Signed)
Report to floor

## 2020-06-22 NOTE — ED Notes (Signed)
POC hemocult positive 

## 2020-06-22 NOTE — ED Triage Notes (Addendum)
Pt arrived via EMS from home, c/o abnormal stool, states dark green stools and fatigue x3 days, n/v yesterday. States he has had issues intermitent since last d/c from hospital end of July.   Recent pna, just finished abx yesterday.   116/62 68 HR 97% RA 115 CBG

## 2020-06-22 NOTE — ED Provider Notes (Signed)
[facility] DEPT Provider Note   CSN: [id number] Arrival date & time: 06/22/20  1829     History Chief Complaint  Patient presents with  . Stool Color Change    Marcoantonio Legault is a 86 y.o. male.  HPI 86 year old male presents with generalized weakness.  He has been feeling weak since he was discharged for pneumonia and GI bleed on 7/21.  He did have recurrent vomiting yesterday which he states was about three episodes of nonbloody emesis.  Has had on and off diarrhea as well, including two episodes this morning.  The stool looks dark to him.  No abdominal pain.  Cough is very minimal at this point.  No fevers, chest pain, shortness of breath.  No focal weakness.   Past Medical History:  Diagnosis Date  . Carotid artery occlusion   . Chronic systolic CHF (congestive heart failure) ([place])    a. 07/2013 Echo: EF 35-40%  . CKD (chronic kidney disease), stage III    pt. states that he does not have CKD  . CML (chronic myelocytic leukemia) ([place])   . Complication of anesthesia    Pt states he has a hard time waking up feels like he cant breathe and esophagus is closing up   . Coronary artery disease    a. 09/2013 Cath: LM min irregs, LAD 95p(2.75x20 Promus Premier DES & 3.0x20 Promus Premier DES overlapping), D1 50-60, LCX 30p, OM1 68m, OM2 60-70p, RCA small, nl.  . Diverticulitis   . Dysphonia 01/08/2017  . History of hiatal hernia   . History of kidney stones   . History of small bowel obstruction   . Hyperlipidemia    denies  . Ischemic cardiomyopathy    a. 07/2013 Echo: EF 35-40%, apical septal, apical lat, apical AK, mod MR.  . Small bowel perforation ([place])    a. 11/2012 s/p emergent SB resection.  . 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    Patient Active Problem List   Diagnosis Date Noted  . Barrett's esophagus without dysplasia   . Hiatal hernia   . Heme positive stool   . Acute blood loss anemia   . Acute gastric ulcer without hemorrhage or  perforation   . Duodenal ulcer disease   . Barrett's esophagus with dysplasia   . AKI (acute kidney injury) ([place]) 06/01/2020  . Acute renal failure superimposed on stage 3 chronic kidney disease ([place]) 05/31/2020  . Lung nodule 05/31/2020  . Enteritis 05/31/2020  . Hypocalcemia   . Protein-calorie malnutrition, severe 01/14/2018  . Acidosis, metabolic, with respiratory acidosis 11/10/2017  . Encounter for weaning from ventilator ([place]) 11/10/2017  . CKD (chronic kidney disease), stage III 11/10/2017  . Status post exploratory laparotomy 11/09/2017  . Bilateral rales   . 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 Cough 08/17/2016  . Carotid stenosis 09/17/2015  . Right carotid bruit 12/14/2014  . Unstable angina ([place]) 10/10/2013  . Ischemic cardiomyopathy   . Hyperlipidemia   . Chronic systolic CHF (congestive heart failure) ([place]) 10/03/2013  . CAD (coronary artery disease) 10/03/2013  . Acute systolic heart failure ([place]) 08/11/2013  . Chronic kidney disease (CKD), stage III (moderate) ([facility]) 08/11/2013  . Acute respiratory failure with hypoxia ([place]) 08/09/2013  . Pulmonary edema 08/09/2013  . Small bowel obstruction ([place]) 08/09/2013    Past Surgical History:  Procedure Laterality Date  . BIOPSY  06/03/2020   Procedure: BIOPSY;  Surgeon: Jerene Bears, MD;  Location: [facility];  Service: Endoscopy;;  . BOWEL RESECTION     3cm sm intestine  . CARDIAC CATHETERIZATION  10/09/2013  . CAROTID ENDARTERECTOMY    . CHOLECYSTECTOMY    . COLON SURGERY  11/2012   BOWEL SURGERY   . CORONARY ANGIOPLASTY  10/09/2013   MID LAD  . ENDARTERECTOMY Right 09/17/2015   Procedure: Right Carotid ENDARTERECTOMY with Xenosure Patch Angioplasty;  Surgeon: Serafina Mitchell, MD;  Location: [facility] OR;  Service: Vascular;  Laterality: Right;  . ESOPHAGOGASTRODUODENOSCOPY (EGD) WITH PROPOFOL N/A 06/03/2020   Procedure: ESOPHAGOGASTRODUODENOSCOPY (EGD) WITH PROPOFOL;   Surgeon: Jerene Bears, MD;  Location: [facility] ENDOSCOPY;  Service: Endoscopy;  Laterality: N/A;  . EYE SURGERY Bilateral   . INGUINAL HERNIA REPAIR Bilateral   . LAPAROSCOPIC CHOLECYSTECTOMY    . LAPAROTOMY N/A 10/14/2017   Procedure: EXPLORATORY LAPAROTOMY for SMALL BOWEL OBSTRUCTION, cecopexy;  Surgeon: Excell Seltzer, MD;  Location: [facility] ORS;  Service: General;  Laterality: N/A;  . LAPAROTOMY N/A 11/09/2017   Procedure: EXPLORATORY LAPAROTOMY reduction small bowel valvulous;  Surgeon: Rolm Bookbinder, MD;  Location: [facility] ORS;  Service: General;  Laterality: N/A;  . LAPAROTOMY N/A 01/10/2018   Procedure: exploratory laparotomy, lysis of adhesions, decompression of midgut volvulus;  Surgeon: Jovita Kussmaul, MD;  Location: [facility] ORS;  Service: General;  Laterality: N/A;  . LAPAROTOMY N/A 08/21/2018   Procedure: EXPLORATORY LAPAROTOMY;  Surgeon: Excell Seltzer, MD;  Location: [facility] ORS;  Service: General;  Laterality: N/A;  . LEFT HEART CATHETERIZATION WITH CORONARY ANGIOGRAM N/A 10/09/2013   Procedure: LEFT HEART CATHETERIZATION WITH CORONARY ANGIOGRAM;  Surgeon: Larey Dresser, MD;  Location: [facility] CATH LAB;  Service: Cardiovascular;  Laterality: N/A;  . PERCUTANEOUS CORONARY STENT INTERVENTION (PCI-S)  10/09/2013   Procedure: PERCUTANEOUS CORONARY STENT INTERVENTION (PCI-S);  Surgeon: Larey Dresser, MD;  Location: [facility] CATH LAB;  Service: Cardiovascular;;       Family History  Problem Relation Age of Onset  . Emphysema Father        died @ 20- smoked  . Heart disease Father   . Kidney Stones Father   . Diabetes Mother        died @ 81  . Stroke Mother   . Head & neck cancer Brother   . Lymphoma Brother   . Colon cancer Neg Hx   . Stomach cancer Neg Hx   . Rectal cancer Neg Hx   . Esophageal cancer Neg Hx   . Liver cancer Neg Hx     Social History   Tobacco Use  . Smoking status: Former Smoker    Packs/day: 1.00    Years: 45.00    Pack years: 45.00    Types: Cigarettes    Quit  date: 06/09/1999    Years since quitting: 21.0  . Smokeless tobacco: Never Used  Vaping Use  . Vaping Use: Never used  Substance Use Topics  . Alcohol use: No    Alcohol/week: 0.0 standard drinks  . Drug use: No    Home Medications Prior to Admission medications   Medication Sig Start Date End Date Taking? Authorizing Provider  acetaminophen (TYLENOL) 325 MG tablet Take 650 mg by mouth every 6 (six) hours as needed for mild pain or headache.   Yes [provider]  Ascorbic Acid (VITAMIN C) 500 MG CAPS Take 500 mg by mouth daily.    Yes [provider]  aspirin EC 81 MG tablet Take 1 tablet (81 mg total) by mouth daily. 10/02/13  Yes Loralie Champagne  S, MD  BOSULIF 400 MG tablet Take 400 mg by mouth daily with breakfast.   Yes [provider]  carvedilol (COREG) 12.5 MG tablet TAKE ONE TABLET BY MOUTH TWICE A DAY Patient taking differently: Take 12.5 mg by mouth 2 (two) times daily with a meal.  05/21/20  Yes Larey Dresser, MD  clotrimazole (MYCELEX) 10 MG troche Take 10 mg by mouth 5 (five) times daily. 10 day supply 06/18/20  Yes [provider]  cyanocobalamin 1000 MCG tablet Take 1,000 mcg by mouth daily.   Yes [provider]  diclofenac Sodium (VOLTAREN) 1 % GEL Apply 2 g topically 4 (four) times daily as needed (pain).   Yes [provider]  diphenhydramine-acetaminophen (TYLENOL PM) 25-500 MG TABS tablet Take 2 tablets by mouth at bedtime as needed (sleep).    Yes [provider]  ezetimibe (ZETIA) 10 MG tablet Take 1 tablet (10 mg total) by mouth daily. 04/09/20  Yes Larey Dresser, MD  famotidine (PEPCID) 20 MG tablet Take 20 mg by mouth as needed for heartburn or indigestion.    Yes [provider]  Ferrous Sulfate (IRON) 325 (65 Fe) MG TABS Take 325 mg by mouth daily.    Yes [provider]  furosemide (LASIX) 20 MG tablet Take 1 tablet (20 mg total) by mouth See admin instructions. Take 20 mg daily  for 3 days as needed if  weight gain of 3 lbs in a week or 2 lbs in 24 hrs. Patient taking differently: Take 10 mg by mouth daily.  06/09/20 07/09/20 Yes Shahmehdi, Valeria Batman, MD  guaiFENesin-dextromethorphan (ROBITUSSIN DM) 100-10 MG/5ML syrup Take 5 mLs by mouth every 4 (four) hours as needed for cough. 06/09/20  Yes Shahmehdi, Seyed A, MD  hydrocortisone cream 1 % Apply 1 application topically 2 (two) times daily as needed for itching.   Yes [provider]  loperamide (IMODIUM) 2 MG capsule Take 2 mg by mouth as needed for diarrhea or loose stools.    Yes [provider]  Simethicone (GAS-X PO) Take 1 tablet by mouth daily as needed (gas).    Yes [provider]    Allergies    Amoxicillin, Crestor [rosuvastatin], Percocet [oxycodone-acetaminophen], Spironolactone, and Metronidazole  Review of Systems   Review of Systems  Constitutional: Positive for fatigue. Negative for fever.  Respiratory: Negative for shortness of breath.   Cardiovascular: Negative for chest pain.  Gastrointestinal: Positive for diarrhea and vomiting. Negative for abdominal pain.  Neurological: Positive for weakness. Negative for headaches.  All other systems reviewed and are negative.   Physical Exam Updated Vital Signs BP (!) 109/52   Pulse 72   Temp 98.1 F (36.7 C)   Resp (!) 22   Ht 5\' 9"  (1.753 m)   Wt 52.2 kg   SpO2 98%   BMI 16.98 kg/m   Physical Exam Vitals and nursing note reviewed.  Constitutional:      General: He is not in acute distress.    Appearance: He is well-developed. He is not ill-appearing or diaphoretic.  HENT:     Head: Normocephalic and atraumatic.     Right Ear: External ear normal.     Left Ear: External ear normal.     Nose: Nose normal.  Eyes:     General:        Right eye: No discharge.        Left eye: No discharge.     Extraocular Movements: Extraocular movements intact.  Pupils: Pupils are equal, round, and reactive to light.   Cardiovascular:     Rate and Rhythm: Normal rate and regular rhythm.     Heart sounds: Normal heart sounds.  Pulmonary:     Effort: Pulmonary effort is normal.     Breath sounds: Normal breath sounds. No wheezing, rhonchi or rales.  Abdominal:     General: There is no distension.     Palpations: Abdomen is soft.     Tenderness: There is no abdominal tenderness.  Genitourinary:    Comments: Light brown stool on DRE Musculoskeletal:     Cervical back: Neck supple.  Skin:    General: Skin is warm and dry.  Neurological:     Mental Status: He is alert.     Comments: CN 3-12 grossly intact. 5/5 strength in all 4 extremities. Grossly normal sensation. Normal finger to nose.   Psychiatric:        Mood and Affect: Mood is not anxious.     ED Results / Procedures / Treatments   Labs (all labs ordered are listed, but only abnormal results are displayed) Labs Reviewed  COMPREHENSIVE METABOLIC PANEL - Abnormal; Notable for the following components:      Result Value   Sodium 132 (*)    Potassium 5.6 (*)    CO2 20 (*)    Glucose, Bld 102 (*)    BUN 45 (*)    Creatinine, Ser 3.25 (*)    Calcium 8.1 (*)    Albumin 3.0 (*)    AST 13 (*)    GFR calc non Af Amer 16 (*)    GFR calc Af Amer 19 (*)    All other components within normal limits  CBC WITH DIFFERENTIAL/PLATELET - Abnormal; Notable for the following components:   WBC 23.9 (*)    RBC 4.03 (*)    Hemoglobin 11.1 (*)    HCT 36.9 (*)    RDW 16.1 (*)    Neutro Abs 20.1 (*)    Monocytes Absolute 2.1 (*)    Abs Immature Granulocytes 0.22 (*)    All other components within normal limits  BASIC METABOLIC PANEL - Abnormal; Notable for the following components:   Sodium 133 (*)    Potassium 5.6 (*)    CO2 20 (*)    BUN 44 (*)    Creatinine, Ser 2.94 (*)    Calcium 7.7 (*)    GFR calc non Af Amer 18 (*)    GFR calc Af Amer 21 (*)    All other components within normal limits  SARS CORONAVIRUS 2 BY RT PCR (HOSPITAL ORDER,  [facility])  C DIFFICILE QUICK SCREEN W PCR REFLEX  URINALYSIS, ROUTINE W REFLEX MICROSCOPIC  PROCALCITONIN  CBC  CREATININE, SERUM  BASIC METABOLIC PANEL  POC OCCULT BLOOD, ED    EKG EKG Interpretation  Date/Time:  Tuesday June 22 2020 09:09:33 EDT Ventricular Rate:  67 PR Interval:    QRS Duration: 85 QT Interval:  429 QTC Calculation: 453 R Axis:   79 Text Interpretation: Sinus rhythm Probable anterior infarct, age indeterminate no significant change since May 31 2020 Confirmed by Sherwood Gambler [phone]) on 06/22/2020 9:30:23 AM   Radiology CT ABDOMEN PELVIS WO CONTRAST  Result Date: 06/22/2020 CLINICAL DATA:  Nausea and vomiting EXAM: CT ABDOMEN AND PELVIS WITHOUT CONTRAST TECHNIQUE: Multidetector CT imaging of the abdomen and pelvis was performed following the standard protocol without IV contrast. COMPARISON:  05/31/2020 FINDINGS: Lower chest:  Emphysematous changes are noted. No focal infiltrate or sizable effusion is seen. Hepatobiliary: No focal liver abnormality is seen. Status post cholecystectomy. No biliary dilatation. Pancreas: Unremarkable. No pancreatic ductal dilatation or surrounding inflammatory changes. Spleen: Normal in size without focal abnormality. Adrenals/Urinary Tract: Adrenal glands are within normal limits bilaterally. Bilateral nonobstructing renal calculi are seen. Prominent extrarenal pelvis is noted on the left. No obstructive changes are seen. The bladder is partially distended. Stomach/Bowel: Scattered diverticular change of the colon is noted without evidence of diverticulitis. Some fluid is noted within the large and small bowel likely related to a diarrheal state. No obstructive changes are seen stomach appears within normal limits. Vascular/Lymphatic: Aortic atherosclerosis. 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 No enlarged abdominal or pelvic lymph nodes. Reproductive: Prostate is unremarkable.  Other: No abdominal wall hernia or abnormality. No abdominopelvic ascites. Musculoskeletal: Degenerative changes of lumbar spine are noted. No acute abnormality is seen. IMPRESSION: Bilateral nonobstructing renal calculi. Diverticulosis without diverticulitis. Mild fluid within the large and small bowel which may be related to an underlying diarrheal state. Electronically Signed   By: Inez Catalina M.D.   On: 06/22/2020 13:32   DG ABD ACUTE 2+V W 1V CHEST  Result Date: 06/22/2020 CLINICAL DATA:  Vomiting EXAM: DG ABDOMEN ACUTE W/ 1V CHEST COMPARISON:  July 12-30, 2021 FINDINGS: Diffuse gaseous distention of bowel without dilation. Air is visualized in the rectum. No free air. Nonspecific scattered air-fluid levels. Unchanged cardiomediastinal silhouette. Emphysematous changes with LEFT greater than RIGHT upper lobe heterogeneous opacities, mildly increased in comparison to prior. No pleural effusion or pneumothorax. Surgical sutures in the LEFT upper quadrant. Vascular calcifications. Status post cholecystectomy. Degenerative changes of the lower lumbar spine with transitional anatomy of L5-S1 with bilateral assimilation joints. IMPRESSION: 1. Diffuse gaseous distention of bowel without dilation or free air. Extent of distension is similar in comparison to May 31, 2020. This may reflect a nonspecific gastroenteritis. If persistent concern for obstruction, recommend dedicated CT. 2. Mildly increased bilateral upper lobe heterogeneous opacities may reflect increased infection or inflammation. Recommend follow-up radiograph in 4-6 weeks to assess for resolution. Electronically Signed   By: Valentino Saxon MD   On: 06/22/2020 10:05    Procedures Procedures (including critical care time)  Medications Ordered in ED Medications  nystatin (MYCOSTATIN) 100000 UNIT/ML suspension 500,000 Units (has no administration in time range)  Vitamin C CAPS 500 mg (has no administration in time range)  aspirin EC tablet  81 mg (has no administration in time range)  bosutinib (BOSULIF) tablet 400 mg (has no administration in time range)  vitamin B-12 (CYANOCOBALAMIN) tablet 1,000 mcg (has no administration in time range)  ezetimibe (ZETIA) tablet 10 mg (has no administration in time range)  famotidine (PEPCID) tablet 20 mg (has no administration in time range)  Iron TABS 325 mg (has no administration in time range)  simethicone (MYLICON) chewable tablet 80 mg (has no administration in time range)  enoxaparin (LOVENOX) injection 40 mg (has no administration in time range)  acetaminophen (TYLENOL) tablet 650 mg (has no administration in time range)    Or  acetaminophen (TYLENOL) suppository 650 mg (has no administration in time range)  ondansetron (ZOFRAN) tablet 4 mg (has no administration in time range)    Or  ondansetron (ZOFRAN) injection 4 mg (has no administration in time range)  0.9 %  sodium chloride infusion (has no administration in time range)  sodium chloride 0.9 % bolus 500 mL (0 mLs Intravenous Stopped 06/22/20 1633)  ED Course  I have reviewed the triage vital signs and the nursing notes.  Pertinent labs & imaging results that were available during my care of the patient were reviewed by me and considered in my medical decision making (see chart for details).    MDM Rules/Calculators/A&P                          Patient has acute kidney injury on top of his chronic kidney disease.  Likely this is from dehydration given poor p.o. intake and now some vomiting and mild diarrhea.  CT obtained given the findings on x-ray but is overall unremarkable compared to previous.  Given his kidney function, he will need fluids and admission.  I discussed with hospitalist for admission he requested repeat BMP as well to make sure no worsening hyperkalemia. Final Clinical Impression(s) / ED Diagnoses Final diagnoses:  Acute kidney injury superimposed on chronic kidney disease [facility])    Rx / DC Orders ED  Discharge Orders    None       Sherwood Gambler, MD 06/22/20 [phone]

## 2020-06-23 DIAGNOSIS — I5022 Chronic systolic (congestive) heart failure: Secondary | ICD-10-CM

## 2020-06-23 DIAGNOSIS — Q433 Congenital malformations of intestinal fixation: Secondary | ICD-10-CM

## 2020-06-23 DIAGNOSIS — K269 Duodenal ulcer, unspecified as acute or chronic, without hemorrhage or perforation: Secondary | ICD-10-CM

## 2020-06-23 DIAGNOSIS — I2510 Atherosclerotic heart disease of native coronary artery without angina pectoris: Secondary | ICD-10-CM

## 2020-06-23 LAB — C DIFFICILE QUICK SCREEN W PCR REFLEX
C Diff antigen: NEGATIVE
C Diff interpretation: NOT DETECTED
C Diff toxin: NEGATIVE

## 2020-06-23 LAB — BASIC METABOLIC PANEL
Anion gap: 6 (ref 5–15)
BUN: 40 mg/dL — ABNORMAL HIGH (ref 8–23)
CO2: 18 mmol/L — ABNORMAL LOW (ref 22–32)
Calcium: 7.2 mg/dL — ABNORMAL LOW (ref 8.9–10.3)
Chloride: 109 mmol/L (ref 98–111)
Creatinine, Ser: 2.46 mg/dL — ABNORMAL HIGH (ref 0.61–1.24)
GFR calc Af Amer: 26 mL/min — ABNORMAL LOW (ref >60)
GFR calc non Af Amer: 23 mL/min — ABNORMAL LOW (ref >60)
Glucose, Bld: 109 mg/dL — ABNORMAL HIGH (ref 70–99)
Potassium: 4.8 mmol/L (ref 3.5–5.1)
Sodium: 133 mmol/L — ABNORMAL LOW (ref 135–145)

## 2020-06-23 LAB — CBC
HCT: 29 % — ABNORMAL LOW (ref 39.0–52.0)
Hemoglobin: 8.4 g/dL — ABNORMAL LOW (ref 13.0–17.0)
MCH: 27.4 pg (ref 26.0–34.0)
MCHC: 29 g/dL — ABNORMAL LOW (ref 30.0–36.0)
MCV: 94.5 fL (ref 80.0–100.0)
Platelets: 242 10*3/uL (ref 150–400)
RBC: 3.07 MIL/uL — ABNORMAL LOW (ref 4.22–5.81)
RDW: 16 % — ABNORMAL HIGH (ref 11.5–15.5)
WBC: 15 10*3/uL — ABNORMAL HIGH (ref 4.0–10.5)
nRBC: 0 % (ref 0.0–0.2)

## 2020-06-23 LAB — URINALYSIS, ROUTINE W REFLEX MICROSCOPIC
Bilirubin Urine: NEGATIVE
Glucose, UA: NEGATIVE mg/dL
Hgb urine dipstick: NEGATIVE
Ketones, ur: NEGATIVE mg/dL
Leukocytes,Ua: NEGATIVE
Nitrite: NEGATIVE
Protein, ur: NEGATIVE mg/dL
Specific Gravity, Urine: 1.013 (ref 1.005–1.030)
pH: 5 (ref 5.0–8.0)

## 2020-06-23 LAB — OCCULT BLOOD, POC DEVICE: Fecal Occult Bld: POSITIVE — AB

## 2020-06-23 LAB — BRAIN NATRIURETIC PEPTIDE: B Natriuretic Peptide: 141.5 pg/mL — ABNORMAL HIGH (ref 0.0–100.0)

## 2020-06-23 LAB — PROCALCITONIN: Procalcitonin: 0.19 ng/mL

## 2020-06-23 MED ORDER — PANTOPRAZOLE SODIUM 40 MG PO TBEC
40.0000 mg | DELAYED_RELEASE_TABLET | Freq: Two times a day (BID) | ORAL | Status: DC
  Administered 2020-06-23 – 2020-06-25 (×4): 40 mg via ORAL
  Filled 2020-06-23 (×4): qty 1

## 2020-06-23 NOTE — Progress Notes (Signed)
Occupational Therapy Evaluation  Patient does not require any physical assistance this date with self care tasks, performing transfers without physical assistance. Patient denies any questions/concerns for OT at this time, has good family support with son living approximately 5 mins away. Will discontinue, please re-order if any new needs arise.    06/23/20 1300  OT Visit Information  Last OT Received On 06/23/20  Assistance Needed +1  History of Present Illness 86 y.o. male with medical history significant for CML, CKD stage III, CAD status post stent, chronic systolic CHF with EF 38%/[id number] cardiomyopathy, per lipidemia, history of a small bowel obstruction/volvulus-multiple surgeries comes to the ED for evaluation of fatigue, diarrhea. He was recently admitted in the hospital and treated for an CKD, pneumonia, diarrhea/vomiting negative for CAD FOBT positive, GI bleed underwent EGD showing Barrett's esophagus erosive gastritis shallow ulcer in the stomach and nonbleeding duodenal ulcers. He finished abx from his last dschcarge on 06/21/20 and also had methylprednisolone dose pack and finished yesterday. He has been taking his lasix and other meds regularly.He has had watery stool since yesterday at least twice with associated nausea, low appetite, and he is getting over his yeast infection in his mouth. He has had felt occasional left chest pain when taking deep breath.  Precautions  Precautions None  Restrictions  Weight Bearing Restrictions No  Home Living  Family/patient expects to be discharged to: Private residence  Living Arrangements Alone  Available Help at Discharge Family;Available PRN/intermittently (family lives 5 mins away check in regularly)  Type of [place] to enter  Entrance Stairs-Number of Steps 3  Entrance Stairs-Rails Right  [place] to live on main level with bedroom/bathroom;Multi-level  Alternate Level Stairs-Number of Steps flight   Alternate Level Stairs-Rails Right  Bathroom Shower/Tub Tub/shower unit;Walk-in shower (has both)  Quarry manager - 2 wheels;Walker - 4 wheels;Shower seat;BSC  Prior Function  Level of Independence Independent  Comments Pt reports independent with household ambulation using rollator, independent with ADLs, and driving. Pt reports hasn't been able to perform yardwork since July 4 when he started getting sick. Pt reports currently participating with HHPT.  Communication  Communication HOH  Pain Assessment  Pain Assessment No/denies pain  Cognition  Arousal/Alertness Awake/alert  Behavior During Therapy WFL for tasks assessed/performed  Overall Cognitive Status Within Functional Limits for tasks assessed  Upper Extremity Assessment  Upper Extremity Assessment Overall WFL for tasks assessed  Lower Extremity Assessment  Lower Extremity Assessment Defer to PT evaluation  Cervical / Trunk Assessment  Cervical / Trunk Assessment Normal  ADL  Overall ADL's  Independent;At baseline  Bed Mobility  Overal bed mobility Independent  Transfers  Overall transfer level Modified independent  Equipment used None  Balance  Overall balance assessment No apparent balance deficits (not formally assessed)  OT - End of Session  Activity Tolerance Patient tolerated treatment well  Patient left in chair;with call bell/phone within reach;with family/visitor present  Nurse Communication Mobility status  OT Assessment  OT Recommendation/Assessment Patient does not need any further OT services  OT Visit Diagnosis Other abnormalities of gait and mobility (R26.89)  OT Problem List Decreased activity tolerance  AM-PAC OT "6 Clicks" Daily Activity Outcome Measure (Version 2)  Help from another person eating meals? 4  Help from another person taking care of personal grooming? 4  Help from another person toileting, which includes using toliet,  bedpan, or urinal? 4  Help from  another person bathing (including washing, rinsing, drying)? 4  Help from another person to put on and taking off regular upper body clothing? 4  Help from another person to put on and taking off regular lower body clothing? 4  6 Click Score 24  OT Recommendation  Follow Up Recommendations No OT follow up  OT Equipment None recommended by OT  Individuals Consulted  Consulted and Agree with Results and Recommendations Patient  Acute Rehab OT Goals  Patient Stated Goal return home with HHPT  OT Goal Formulation With patient  Time For Goal Achievement 07/07/20  OT Time Calculation  OT Start Time (ACUTE ONLY) 1149  OT Stop Time (ACUTE ONLY) 1200  OT Time Calculation (min) 11 min  OT General Charges  $OT Visit 1 Visit  OT Evaluation  $OT Eval Low Complexity 1 Low  Written Expression  Dominant Hand  (did not specify)   Delbert Phenix OT OT pager: [phone]

## 2020-06-23 NOTE — Evaluation (Signed)
Physical Therapy Evaluation Patient Details Name: [name] [name] MRN: [id number] DOB: [birth year] Today's Date: 06/23/2020   History of Present Illness  86 y.o. male with medical history significant for CML, CKD stage III, CAD status post stent, chronic systolic CHF with EF 76%/PPJKDTOI cardiomyopathy, per lipidemia, history of a small bowel obstruction/volvulus-multiple surgeries comes to the ED for evaluation of fatigue, diarrhea. He was recently admitted in the hospital and treated for an CKD, pneumonia, diarrhea/vomiting negative for CAD FOBT positive, GI bleed underwent EGD showing Barrett's esophagus erosive gastritis shallow ulcer in the stomach and nonbleeding duodenal ulcers. He finished abx from his last dschcarge on 06/21/20 and also had methylprednisolone dose pack and finished yesterday. He has been taking his lasix and other meds regularly.He has had watery stool since yesterday at least twice with associated nausea, low appetite, and he is getting over his yeast infection in his mouth. He has had felt occasional left chest pain when taking deep breath.  Clinical Impression  Pt admitted with above diagnosis. Pt able to get in/out of bed independently and rise from EOB and toilet without physical assist. Pt limited to ambulation in room per pt request; uses RW safely and appropriately in room without unsteadiness noted. Pt active with HHPT PTA and has been declining since July 2021, has good family support and motivated to maintain strength. Pt currently with functional limitations due to the deficits listed below (see PT Problem List). Pt will benefit from skilled PT to increase their independence and safety with mobility to allow discharge to the venue listed below.       Follow Up Recommendations Home health PT    Equipment Recommendations  None recommended by PT    Recommendations for Other Services       Precautions / Restrictions Precautions Precautions: None Restrictions Weight  Bearing Restrictions: No      Mobility  Bed Mobility Overal bed mobility: Independent  General bed mobility comments: able to come to sitting independently, no physical assist or use of bedrails  Transfers Overall transfer level: Modified independent Equipment used: Rolling walker (2 wheeled) Transfers: Sit to/from [facility] Sit to Stand: Modified independent (Device/Increase time)  General transfer comment: able to rise from seated with BUE assisting, good hand placement, no unsteadiness noted  Ambulation/Gait Ambulation/Gait assistance: Supervision Gait Distance (Feet): 20 Feet Assistive device: Rolling walker (2 wheeled) Gait Pattern/deviations: Step-through pattern;Decreased stride length Gait velocity: slightly decreased   General Gait Details: SUPV using RW while ambulating throughout room to restroom, able to safely clear past obstacles in room without LOB; pt declines further ambulation at this time without reason  Stairs            Wheelchair Mobility    Modified Rankin (Stroke Patients Only)       Balance Overall balance assessment: No apparent balance deficits (not formally assessed)            Pertinent Vitals/Pain Pain Assessment: Faces Faces Pain Scale: Hurts a little bit Pain Location: back Pain Descriptors / Indicators: Aching;Sore Pain Intervention(s): Limited activity within patient's tolerance;Monitored during session;Repositioned    Home Living Family/patient expects to be discharged to:: Private residence Living Arrangements: Alone Available Help at Discharge: Family;Available PRN/intermittently (family lives about 5 minutes away and can check on him easily- has 2 children in [place], 1 in [place], 1 in [place]) Type of Home: House Home Access: Stairs to enter Entrance Stairs-Rails: Right Entrance Stairs-Number of Steps: 3 Home Layout: Able to live on main level with  bedroom/bathroom;Multi-level Home Equipment: [place] - 2  wheels;Walker - 4 wheels;Shower seat;Bedside commode      Prior Function Level of Independence: Independent         Comments: Pt reports independent with household ambulation using rollator, independent with ADLs, and driving. Pt reports hasn't been able to perform yardwork since July 4 when he started getting sick. Pt reports currently participating with HHPT.     Hand Dominance        Extremity/Trunk Assessment   Upper Extremity Assessment Upper Extremity Assessment: Defer to OT evaluation    Lower Extremity Assessment Lower Extremity Assessment: Overall WFL for tasks assessed (AROM WNL, strength 4+/5, symmetrical, denies numbness/tingling)    Cervical / Trunk Assessment Cervical / Trunk Assessment: Normal  Communication   Communication: HOH  Cognition Arousal/Alertness: Awake/alert Behavior During Therapy: WFL for tasks assessed/performed Overall Cognitive Status: Within Functional Limits for tasks assessed            General Comments      Exercises     Assessment/Plan    PT Assessment Patient needs continued PT services  PT Problem List Decreased strength;Decreased activity tolerance;Pain       PT Treatment Interventions DME instruction;Gait training;Stair training;Functional mobility training;Therapeutic activities;Therapeutic exercise;Balance training;Patient/family education    PT Goals (Current goals can be found in the Care Plan section)  Acute Rehab PT Goals Patient Stated Goal: return home with HHPT PT Goal Formulation: With patient Time For Goal Achievement: 06/23/20 Potential to Achieve Goals: Good    Frequency Min 3X/week   Barriers to discharge        Co-evaluation               AM-PAC PT "6 Clicks" Mobility  Outcome Measure Help needed turning from your back to your side while in a flat bed without using bedrails?: None Help needed moving from lying on your back to sitting on the side of a flat bed without using bedrails?:  None Help needed moving to and from a bed to a chair (including a wheelchair)?: None Help needed standing up from a chair using your arms (e.g., wheelchair or bedside chair)?: None Help needed to walk in hospital room?: None Help needed climbing 3-5 steps with a railing? : A Little 6 Click Score: 23    End of Session   Activity Tolerance: Patient tolerated treatment well Patient left: in bed;with call bell/phone within reach Nurse Communication: Mobility status PT Visit Diagnosis: Other abnormalities of gait and mobility (R26.89)    Time: 1010-1028 PT Time Calculation (min) (ACUTE ONLY): 18 min   Charges:   PT Evaluation $PT Eval Low Complexity: 1 Low           Tori Amela Handley PT, DPT 06/23/20, 12:34 PM

## 2020-06-23 NOTE — Progress Notes (Signed)
Initial Nutrition Assessment  DOCUMENTATION CODES:   Severe malnutrition in context of chronic illness, Underweight  INTERVENTION:  Continue Ensure Enlive po BID, each supplement provides 350 kcal and 20 grams of protein (chocolate) Magic cup TID with meals, each supplement provides 290 kcal and 9 grams of protein (chocolate) MVI with minerals daily   NUTRITION DIAGNOSIS:   Severe Malnutrition related to chronic illness (CML, chronic sCHF with EF 30%) as evidenced by per patient/family report, energy intake < or equal to 75% for > or equal to 1 month, percent weight loss.    GOAL:   Patient will meet greater than or equal to 90% of their needs    MONITOR:   PO intake, Supplement acceptance, Weight trends, I & O's, Labs  REASON FOR ASSESSMENT:   Consult Assessment of nutrition requirement/status, Diet education  ASSESSMENT:  RD working remotely.   86 year old male with past medical history of CML, CKD stage III, CAD s/p stent, chronic sCHF with EF 30%, ischemic cardiomyopathy, history of small bowel obstruction/volvulus s/p multiple surgeries and recent hospital admission for treatment of CKD, pna, and GIB s/p EGD that revealed Barrett's esophagus and erosive gastritis presented with fatigue and diarrhea admitted for acute kidney injury superimposed on chronic kidney disease.  Patient reports feeling much better today, hopeful of going home tomorrow. He reports improving appetite and intake, however eat recalls mostly liquids for breakfast, said his eggs and bacon were ice cold and unable to eat them. Patient unable to recall dinner last night, but reports eating most of his meal. Patient reports appetite has recently been "real bad" at home over the past couple of months secondary to recently diagnosed Barrett's esophagus as well as being on so many antibiotics resulting in a yeast infection in his mouth, pt stated the only thing that tasted good to him was a chocolate chip  cookie. Patient recalls usually having a good appetite, he enjoys cooking, eats 2-3 meals daily. Patient reports that he has trouble gaining wt due to [facility] and on chemotherapy pill. Patient reports previous regular intake of Ensure supplements, but has not drank them on a consistent basis since his wife passed. RD educated on the importance of adequate nutrition, recommended eating small frequent meals/snacks daily po of nutrient dense supplements. Patient reports son has purchased a case of Ensure, agreeable to drinking 1-2 daily.   Current wt 107.78 lb, non-pitting RUE, BLE edema per RN assessment. UBW 120-125 lb per pt report Per chart, weights have trended down 16.72 lb (13.2%) in 4 months which is significant. Patient is underweight, given trends, CML with reports of inability to gain weight as well as reported poor oral intake over the past couple of months, pt meets criteria for malnutrition.   I/Os: +620 ml since admit Medications reviewed and include: Vitamin C, Ferrous sulfate, B12 Labs: Na 133 (L), BUN 40 (H), Cr 2.46 (H), BNP 141.5 (H), WBC 15 (H), Hgb 8.4 (L)   NUTRITION - FOCUSED PHYSICAL EXAM: Unable to complete at this time, RD working remotely.  Diet Order:   Diet Order            Diet regular Room service appropriate? Yes; Fluid consistency: Thin  Diet effective now                 EDUCATION NEEDS:   Education needs have been addressed  Skin:  Skin Assessment: Reviewed RN Assessment  Last BM:  8/4-type 6  Height:   Ht Readings from Last  1 Encounters:  06/22/20 5\' 9"  (1.753 m)    Weight:   Wt Readings from Last 1 Encounters:  06/22/20 49.9 kg    Ideal Body Weight:  72.7 kg  BMI:  Body mass index is 16.24 kg/m.  Estimated Nutritional Needs:   Kcal:  4403-4742  Protein:  75-85  Fluid:  >/= 1.5 L   Lajuan Lines, RD, LDN Clinical Nutrition After Hours/Weekend Pager # in [email or web address]

## 2020-06-23 NOTE — Progress Notes (Signed)
PROGRESS NOTE    [name] [name]  AST:[id number] DOB: Nov 24, 1929 DOA: 06/22/2020 PCP: Drake Leach, MD   Brief Narrative:  86 y.o. male with medical history significant for CML, CKD stage III, CAD status post stent, chronic systolic CHF with EF 98%/XQJJHERD cardiomyopathy, per lipidemia, history of a small bowel obstruction/volvulus-multiple surgeries comes to the ED for evaluation of fatigue, diarrhea.  Recurrent admission from dehydration, AKI from his Lasix use.  Recently also had hospital course complicated by possible GI bleed diagnosed with Barrett's esophagus/erosive gastritis started on PPI.   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  Admission creatinine 3.25, trended down to 2.4.  Complicated by recurrent hypotension -Secondary to poor oral intake and Lasix use -Discussed with Cardiology, Dr Radford Pax. Recommending holding Cardiac meds, doing lasix only prn and they will arrange for outpatient follow-up  Congestive heart failure reduced ejection fraction, 30% -Follows outpatient at CHF clinic.  Currently Lasix is on hold due to intravascular volume depletion - Coreg on hold due tohypotension  Diarrhea, nonspecific, bloody -C. difficile-negative  History of coronary artery disease/ischemic cardiomyopathy -Home Coreg on hold -Cardiology consulted for medication adjustment and close outpatient follow-up  History of midgut volvulus -Status post exploratory laparotomy/enteropexy in October 2019  Barrett's  esophagus with erosive gastritis/gastric and duodenal ulcer -PPI  History of CML -Resume home meds  Iron deficiency anemia -Daily iron supplements  Adult failure to thrive/deconditioning, BMI around 16 -PT/OT evaluation -Consult nutrition, add boost    DVT prophylaxis: Lovenox Code Status: Full code Family Communication:    Status is: Inpatient  Remains inpatient appropriate because:Inpatient level of care appropriate due to severity of illness   Dispo: The patient is from: Home              Anticipated d/c is to: Home              Anticipated d/c date is: 2 days              Patient currently is not medically stable to d/c.       Body mass index is 16.24 kg/m.         Subjective: Patient feels better, no complaints.  Admits that his p.o. intake has been inconsistent and very poor.  Review of Systems Otherwise negative except as per HPI, including: General: Denies fever, chills, night sweats or unintended weight loss. Resp: Denies cough, wheezing, shortness of breath. Cardiac: Denies chest pain, palpitations, orthopnea, paroxysmal nocturnal dyspnea. GI: Denies abdominal pain, nausea, vomiting, diarrhea or constipation GU: Denies dysuria, frequency, hesitancy or incontinence MS: Denies muscle aches, joint pain or swelling Neuro: Denies headache, neurologic deficits (focal weakness, numbness, tingling), abnormal gait Psych: Denies anxiety, depression, SI/HI/AVH Skin: Denies new rashes or lesions ID: Denies sick contacts, exotic exposures, travel  Examination:  General exam: Appears calm and comfortable  Respiratory system: Clear to auscultation. Respiratory effort normal. Cardiovascular system: S1 & S2 heard, RRR. No JVD, murmurs, rubs, gallops or clicks. No pedal edema. Gastrointestinal system: Abdomen is nondistended, soft and nontender. No organomegaly or masses  felt. Normal bowel sounds heard. Central nervous system: Alert and oriented. No focal  neurological deficits. Extremities: Symmetric 5 x 5 power. Skin: No rashes, lesions or ulcers Psychiatry: Judgement and insight appear normal. Mood & affect appropriate.     Objective: Vitals:   06/22/20 1720 06/22/20 2113 06/23/20 0132 06/23/20 0459  BP: (!) 123/59 (!) 103/53 (!) 119/51 (!) 109/55  Pulse: 72 68 65 (!) 58  Resp: 17 18 18 18   Temp: 98 F (36.7 C) 97.7 F (36.5 C) 97.7 F (36.5 C) 97.8 F (36.6 C)  TempSrc: Oral  Oral Oral  SpO2: 100% 96% 98% 97%  Weight:      Height:        Intake/Output Summary (Last 24 hours) at 06/23/2020 0837 Last data filed at 06/22/2020 2223 Gross per 24 hour  Intake 620 ml  Output --  Net 620 ml   Filed Weights   06/22/20 0831 06/22/20 1655  Weight: 52.2 kg 49.9 kg     Data Reviewed:   CBC: Recent Labs  Lab 06/22/20 0850 06/23/20 0350  WBC 23.9* 15.0*  NEUTROABS 20.1*  --   HGB 11.1* 8.4*  HCT 36.9* 29.0*  MCV 91.6 94.5  PLT 329 595   Basic Metabolic Panel: Recent Labs  Lab 06/22/20 0850 06/22/20 1504 06/22/20 2048 06/23/20 0350  NA 132* 133* 136 133*  K 5.6* 5.6* 5.3* 4.8  CL 102 106 109 109  CO2 20* 20* 18* 18*  GLUCOSE 102* 90 115* 109*  BUN 45* 44* 43* 40*  CREATININE 3.25* 2.94* 2.89* 2.46*  CALCIUM 8.1* 7.7* 7.7* 7.2*   GFR: Estimated Creatinine Clearance: 14.9 mL/min (A) (by C-G formula based on SCr of 2.46 mg/dL (H)). Liver Function Tests: Recent Labs  Lab 06/22/20 0850  AST 13*  ALT 14  ALKPHOS 107  BILITOT 0.7  PROT 7.5  ALBUMIN 3.0*   No results for input(s): LIPASE, AMYLASE in the last 168 hours. No results for input(s): AMMONIA in the last 168 hours. Coagulation Profile: No results for input(s): INR, PROTIME in the last 168 hours. Cardiac Enzymes: No results for input(s): CKTOTAL, CKMB, CKMBINDEX, TROPONINI in the last 168 hours. BNP (last 3 results) No results for input(s): PROBNP in the last 8760 hours. HbA1C: No results for input(s): HGBA1C in the last 72 hours. CBG: No  results for input(s): GLUCAP in the last 168 hours. Lipid Profile: No results for input(s): CHOL, HDL, LDLCALC, TRIG, CHOLHDL, LDLDIRECT in the last 72 hours. Thyroid Function Tests: No results for input(s): TSH, T4TOTAL, FREET4, T3FREE, THYROIDAB in the last 72 hours. Anemia Panel: No results for input(s): VITAMINB12, FOLATE, FERRITIN, TIBC, IRON, RETICCTPCT in the last 72 hours. Sepsis Labs: Recent Labs  Lab 06/22/20 1500 06/23/20 0350  PROCALCITON 0.24 0.19    Recent Results (from the past 240 hour(s))  SARS Coronavirus 2 by RT PCR (hospital order, performed in [facility] hospital lab) Nasopharyngeal Nasopharyngeal Swab     Status: None   Collection Time: 06/22/20 11:43 AM   Specimen: Nasopharyngeal Swab  Result Value Ref Range Status   SARS Coronavirus 2 NEGATIVE NEGATIVE Final    Comment: (NOTE) SARS-CoV-2 target nucleic acids are NOT DETECTED.  The SARS-CoV-2 RNA is generally detectable in upper and lower respiratory specimens during the acute phase of infection. The lowest concentration of SARS-CoV-2 viral copies this assay can detect is 250 copies / mL. A negative result does not preclude SARS-CoV-2 infection and should not be used as the sole basis for treatment  or other patient management decisions.  A negative result may occur with improper specimen collection / handling, submission of specimen other than nasopharyngeal swab, presence of viral mutation(s) within the areas targeted by this assay, and inadequate number of viral copies (<250 copies / mL). A negative result must be combined with clinical observations, patient history, and epidemiological information.  Fact Sheet for Patients:   StrictlyIdeas.no  Fact Sheet for Healthcare Providers: BankingDealers.co.za  This test is not yet approved or  cleared by the [place] FDA and has been authorized for detection and/or diagnosis of SARS-CoV-2 by FDA under an  Emergency Use Authorization (EUA).  This EUA will remain in effect (meaning this test can be used) for the duration of the COVID-19 declaration under Section 564(b)(1) of the Act, 21 U.S.C. section 360bbb-3(b)(1), unless the authorization is terminated or revoked sooner.  Performed at [facility], [place] [address], [place], [place] [address]   C Difficile Quick Screen w PCR reflex     Status: None   Collection Time: 06/23/20  5:26 AM   Specimen: STOOL  Result Value Ref Range Status   C Diff antigen NEGATIVE NEGATIVE Final   C Diff toxin NEGATIVE NEGATIVE Final   C Diff interpretation No C. difficile detected.  Final    Comment: Performed at [facility], [place] [address] [place], [place] [address]         Radiology Studies: CT ABDOMEN PELVIS WO CONTRAST  Result Date: 06/22/2020 CLINICAL DATA:  Nausea and vomiting EXAM: CT ABDOMEN AND PELVIS WITHOUT CONTRAST TECHNIQUE: Multidetector CT imaging of the abdomen and pelvis was performed following the standard protocol without IV contrast. COMPARISON:  05/31/2020 FINDINGS: Lower chest: Emphysematous changes are noted. No focal infiltrate or sizable effusion is seen. Hepatobiliary: No focal liver abnormality is seen. Status post cholecystectomy. No biliary dilatation. Pancreas: Unremarkable. No pancreatic ductal dilatation or surrounding inflammatory changes. Spleen: Normal in size without focal abnormality. Adrenals/Urinary Tract: Adrenal glands are within normal limits bilaterally. Bilateral nonobstructing renal calculi are seen. Prominent extrarenal pelvis is noted on the left. No obstructive changes are seen. The bladder is partially distended. Stomach/Bowel: Scattered diverticular change of the colon is noted without evidence of diverticulitis. Some fluid is noted within the large and small bowel likely related to a diarrheal state. No obstructive changes are seen stomach appears within normal limits.  Vascular/Lymphatic: Aortic atherosclerosis. 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 No enlarged abdominal or pelvic lymph nodes. Reproductive: Prostate is unremarkable. Other: No abdominal wall hernia or abnormality. No abdominopelvic ascites. Musculoskeletal: Degenerative changes of lumbar spine are noted. No acute abnormality is seen. IMPRESSION: Bilateral nonobstructing renal calculi. Diverticulosis without diverticulitis. Mild fluid within the large and small bowel which may be related to an underlying diarrheal state. Electronically Signed   By: Inez Catalina M.D.   On: 06/22/2020 13:32   DG ABD ACUTE 2+V W 1V CHEST  Result Date: 06/22/2020 CLINICAL DATA:  Vomiting EXAM: DG ABDOMEN ACUTE W/ 1V CHEST COMPARISON:  July 12-30, 2021 FINDINGS: Diffuse gaseous distention of bowel without dilation. Air is visualized in the rectum. No free air. Nonspecific scattered air-fluid levels. Unchanged cardiomediastinal silhouette. Emphysematous changes with LEFT greater than RIGHT upper lobe heterogeneous opacities, mildly increased in comparison to prior. No pleural effusion or pneumothorax. Surgical sutures in the LEFT upper quadrant. Vascular calcifications. Status post cholecystectomy. Degenerative changes of the lower lumbar spine with transitional anatomy of L5-S1 with bilateral assimilation joints. IMPRESSION: 1. Diffuse gaseous  distention of bowel without dilation or free air. Extent of distension is similar in comparison to May 31, 2020. This may reflect a nonspecific gastroenteritis. If persistent concern for obstruction, recommend dedicated CT. 2. Mildly increased bilateral upper lobe heterogeneous opacities may reflect increased infection or inflammation. Recommend follow-up radiograph in 4-6 weeks to assess for resolution. Electronically Signed   By: Valentino Saxon MD   On: 06/22/2020 10:05        Scheduled Meds: . ascorbic acid  500 mg Oral Daily  . aspirin EC  81 mg Oral  Daily  . bosutinib  400 mg Oral Q breakfast  . enoxaparin (LOVENOX) injection  30 mg Subcutaneous Q24H  . ezetimibe  10 mg Oral Daily  . feeding supplement (ENSURE ENLIVE)  237 mL Oral BID BM  . ferrous sulfate  325 mg Oral Daily  . nystatin  5 mL Oral QID  . cyanocobalamin  1,000 mcg Oral Daily   Continuous Infusions: . sodium chloride 75 mL/hr at 06/22/20 1910     LOS: 1 day   Time spent= 35 mins    Millette Halberstam Arsenio Loader, MD [facility]  If 7PM-7AM, please contact night-coverage  06/23/2020, 8:37 AM

## 2020-06-24 DIAGNOSIS — I255 Ischemic cardiomyopathy: Secondary | ICD-10-CM

## 2020-06-24 DIAGNOSIS — D500 Iron deficiency anemia secondary to blood loss (chronic): Secondary | ICD-10-CM

## 2020-06-24 LAB — PROCALCITONIN: Procalcitonin: <0.1 ng/mL

## 2020-06-24 LAB — VITAMIN B12: Vitamin B-12: 6702 pg/mL — ABNORMAL HIGH (ref 180–914)

## 2020-06-24 LAB — BASIC METABOLIC PANEL
Anion gap: 3 — ABNORMAL LOW (ref 5–15)
BUN: 24 mg/dL — ABNORMAL HIGH (ref 8–23)
CO2: 17 mmol/L — ABNORMAL LOW (ref 22–32)
Calcium: 6.8 mg/dL — ABNORMAL LOW (ref 8.9–10.3)
Chloride: 111 mmol/L (ref 98–111)
Creatinine, Ser: 1.91 mg/dL — ABNORMAL HIGH (ref 0.61–1.24)
GFR calc Af Amer: 36 mL/min — ABNORMAL LOW (ref >60)
GFR calc non Af Amer: 31 mL/min — ABNORMAL LOW (ref >60)
Glucose, Bld: 95 mg/dL (ref 70–99)
Potassium: 5.6 mmol/L — ABNORMAL HIGH (ref 3.5–5.1)
Sodium: 131 mmol/L — ABNORMAL LOW (ref 135–145)

## 2020-06-24 LAB — CBC
HCT: 24.9 % — ABNORMAL LOW (ref 39.0–52.0)
Hemoglobin: 7.5 g/dL — ABNORMAL LOW (ref 13.0–17.0)
MCH: 27.6 pg (ref 26.0–34.0)
MCHC: 30.1 g/dL (ref 30.0–36.0)
MCV: 91.5 fL (ref 80.0–100.0)
Platelets: 224 10*3/uL (ref 150–400)
RBC: 2.72 MIL/uL — ABNORMAL LOW (ref 4.22–5.81)
RDW: 16 % — ABNORMAL HIGH (ref 11.5–15.5)
WBC: 13.5 10*3/uL — ABNORMAL HIGH (ref 4.0–10.5)
nRBC: 0 % (ref 0.0–0.2)

## 2020-06-24 LAB — PREPARE RBC (CROSSMATCH)

## 2020-06-24 LAB — IRON AND TIBC
Iron: 17 ug/dL — ABNORMAL LOW (ref 45–182)
Saturation Ratios: 8 % — ABNORMAL LOW (ref 17.9–39.5)
TIBC: 212 ug/dL — ABNORMAL LOW (ref 250–450)
UIBC: 195 ug/dL

## 2020-06-24 LAB — TSH: TSH: 5.284 u[IU]/mL — ABNORMAL HIGH (ref 0.350–4.500)

## 2020-06-24 LAB — MAGNESIUM: Magnesium: 2.1 mg/dL (ref 1.7–2.4)

## 2020-06-24 LAB — FERRITIN: Ferritin: 179 ng/mL (ref 24–336)

## 2020-06-24 MED ORDER — SENNOSIDES-DOCUSATE SODIUM 8.6-50 MG PO TABS: 2.0000 | ORAL_TABLET | Freq: Every evening | ORAL | Status: DC | PRN

## 2020-06-24 MED ORDER — FERROUS SULFATE 325 (65 FE) MG PO TABS
325.0000 mg | ORAL_TABLET | Freq: Every day | ORAL | Status: DC
  Administered 2020-06-25: 325 mg via ORAL
  Filled 2020-06-24: qty 1

## 2020-06-24 MED ORDER — SODIUM CHLORIDE 0.9 % IV SOLN
510.0000 mg | Freq: Once | INTRAVENOUS | Status: AC
  Administered 2020-06-24: 510 mg via INTRAVENOUS
  Filled 2020-06-24: qty 510

## 2020-06-24 MED ORDER — SODIUM ZIRCONIUM CYCLOSILICATE 10 G PO PACK
10.0000 g | PACK | Freq: Once | ORAL | Status: AC
  Administered 2020-06-24: 10 g via ORAL
  Filled 2020-06-24: qty 1

## 2020-06-24 MED ORDER — POLYETHYLENE GLYCOL 3350 17 G PO PACK: 17.0000 g | PACK | Freq: Every day | ORAL | Status: DC | PRN

## 2020-06-24 MED ORDER — SODIUM CHLORIDE 0.9% IV SOLUTION: Freq: Once | INTRAVENOUS | Status: DC

## 2020-06-24 MED ORDER — FERROUS SULFATE 325 (65 FE) MG PO TABS: 325.0000 mg | ORAL_TABLET | Freq: Every day | ORAL | Status: DC

## 2020-06-24 MED ORDER — FERROUS SULFATE 325 (65 FE) MG PO TABS: 325.0000 mg | ORAL_TABLET | Freq: Two times a day (BID) | ORAL | Status: DC

## 2020-06-24 NOTE — Progress Notes (Signed)
PROGRESS NOTE    Monta Police  JOA:[id number] DOB: 10-30-1930 DOA: 06/22/2020 PCP: Drake Leach, MD   Brief Narrative:  86 y.o. male with medical history significant for CML, CKD stage III, CAD status post stent, chronic systolic CHF with EF 60%/FUXNATFT cardiomyopathy, per lipidemia, history of a small bowel obstruction/volvulus-multiple surgeries comes to the ED for evaluation of fatigue, diarrhea.  Recurrent admission from dehydration, AKI from his Lasix use.  Recently also had hospital course complicated by possible GI bleed diagnosed with Barrett's esophagus/erosive gastritis started on PPI.   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  Admission creatinine 3.25, trended down to 2.4. Improving with fluids, down to 1.91 this morning. -Secondary to poor oral intake and Lasix use -Discussed with Cardiology, Dr Radford Pax. Recommending holding Cardiac meds, doing lasix only prn and they will arrange for outpatient follow-up -Stop IV fluids  Hyperkalemia -Potassium 5.6. Appears somewhat chronic in nature. EKG recheck this morning does not show any acute T wave changes -1 dose of Lokelma  Congestive heart failure reduced ejection fraction, 30% -Follows outpatient at CHF clinic. Continue to hold Lasix for another day. - Coreg on hold due tohypotension  Diarrhea, nonspecific, bloody -C. difficile-negative  History of coronary artery disease/ischemic cardiomyopathy -Home Coreg on hold -Cardiology consulted for  medication adjustment and close outpatient follow-up  History of midgut volvulus -Status post exploratory laparotomy/enteropexy in October 2019  Barrett's esophagus with erosive gastritis/gastric and duodenal ulcer -PPI  History of CML -Resume home meds  Iron deficiency anemia, hemoglobin down to 7.5 -Currently his hemoglobin has trended down secondary to hemodilution but he probably truly is iron deficient as suggested by iron studies. We will give him 1 unit PRBC transfusion, 1 dose IV iron -Continue home p.o. regimen.  Adult failure to thrive/deconditioning, BMI around 16 -PT/OT evaluation -Consult nutrition, add boost    DVT prophylaxis: Lovenox Code Status: Full code Family Communication: Spoke with patient's son Alon over the phone  Status is: Inpatient  Remains inpatient appropriate because:Inpatient level of care appropriate due to severity of illness   Dispo: The patient is from: Home              Anticipated d/c is to: Home              Anticipated d/c date is: 1 day              Patient currently is not medically stable to d/c. If the hemoglobin remained stable over next 24 hours, I plan on discharging him.       Body mass index is 16.24 kg/m.     Subjective: Patient feels much better no complaints this morning. I discussed blood transfusion with him, he is okay with receiving day. He denies any obvious evidence of blood loss.  Review of Systems Otherwise negative except as per HPI, including: General: Denies fever, chills, night sweats or unintended weight loss. Resp: Denies cough, wheezing, shortness of breath. Cardiac: Denies chest pain, palpitations, orthopnea, paroxysmal nocturnal dyspnea. GI: Denies abdominal pain, nausea, vomiting, diarrhea or  constipation GU: Denies dysuria, frequency, hesitancy or incontinence MS: Denies muscle aches, joint pain or swelling Neuro: Denies headache, neurologic deficits (focal weakness, numbness, tingling),  abnormal gait Psych: Denies anxiety, depression, SI/HI/AVH Skin: Denies new rashes or lesions ID: Denies sick contacts, exotic exposures, travel  Examination:  Constitutional: Not in acute distress Respiratory: Clear to auscultation bilaterally Cardiovascular: Normal sinus rhythm, no rubs Abdomen: Nontender nondistended good bowel sounds Musculoskeletal: No edema noted Skin: No rashes seen Neurologic: CN 2-12 grossly intact.  And nonfocal Psychiatric: Normal judgment and insight. Alert and oriented x 3. Normal mood.   Objective: Vitals:   06/23/20 1933 06/24/20 0341 06/24/20 1044 06/24/20 1100  BP: (!) 110/54 (!) 107/54 (!) 118/52 (!) 109/47  Pulse: 65 62 68 99  Resp: 18 18 17 18   Temp: 98.2 F (36.8 C) 98.2 F (36.8 C) 98.2 F (36.8 C) 98.2 F (36.8 C)  TempSrc: Oral Oral  Oral  SpO2: 99% 96% 97% 97%  Weight:      Height:        Intake/Output Summary (Last 24 hours) at 06/24/2020 1214 Last data filed at 06/24/2020 0600 Gross per 24 hour  Intake 945 ml  Output 500 ml  Net 445 ml   Filed Weights   06/22/20 0831 06/22/20 1655  Weight: 52.2 kg 49.9 kg     Data Reviewed:   CBC: Recent Labs  Lab 06/22/20 0850 06/23/20 0350 06/24/20 0339  WBC 23.9* 15.0* 13.5*  NEUTROABS 20.1*  --   --   HGB 11.1* 8.4* 7.5*  HCT 36.9* 29.0* 24.9*  MCV 91.6 94.5 91.5  PLT 329 242 371   Basic Metabolic Panel: Recent Labs  Lab 06/22/20 0850 06/22/20 1504 06/22/20 2048 06/23/20 0350 06/24/20 0339  NA 132* 133* 136 133* 131*  K 5.6* 5.6* 5.3* 4.8 5.6*  CL 102 106 109 109 111  CO2 20* 20* 18* 18* 17*  GLUCOSE 102* 90 115* 109* 95  BUN 45* 44* 43* 40* 24*  CREATININE 3.25* 2.94* 2.89* 2.46* 1.91*  CALCIUM 8.1* 7.7* 7.7* 7.2* 6.8*  MG  --   --   --   --  2.1   GFR: Estimated Creatinine Clearance: 19.2 mL/min (A) (by C-G formula based on SCr of 1.91 mg/dL (H)). Liver Function Tests: Recent Labs  Lab 06/22/20 0850  AST 13*  ALT 14  ALKPHOS 107  BILITOT 0.7  PROT  7.5  ALBUMIN 3.0*   No results for input(s): LIPASE, AMYLASE in the last 168 hours. No results for input(s): AMMONIA in the last 168 hours. Coagulation Profile: No results for input(s): INR, PROTIME in the last 168 hours. Cardiac Enzymes: No results for input(s): CKTOTAL, CKMB, CKMBINDEX, TROPONINI in the last 168 hours. BNP (last 3 results) No results for input(s): PROBNP in the last 8760 hours. HbA1C: No results for input(s): HGBA1C in the last 72 hours. CBG: No results for input(s): GLUCAP in the last 168 hours. Lipid Profile: No results for input(s): CHOL, HDL, LDLCALC, TRIG, CHOLHDL, LDLDIRECT in the last 72 hours. Thyroid Function Tests: Recent Labs    06/24/20 0823  TSH 5.284*   Anemia Panel: Recent Labs    06/24/20 0823  VITAMINB12 6,702*  FERRITIN 179  TIBC 212*  IRON 17*   Sepsis Labs: Recent Labs  Lab 06/22/20 1500 06/23/20 0350 06/24/20 0339  PROCALCITON 0.24 0.19 <0.10    Recent Results (from the past 240 hour(s))  SARS Coronavirus 2 by RT PCR (hospital order, performed in [facility] hospital lab)  Nasopharyngeal Nasopharyngeal Swab     Status: None   Collection Time: 06/22/20 11:43 AM   Specimen: Nasopharyngeal Swab  Result Value Ref Range Status   SARS Coronavirus 2 NEGATIVE NEGATIVE Final    Comment: (NOTE) SARS-CoV-2 target nucleic acids are NOT DETECTED.  The SARS-CoV-2 RNA is generally detectable in upper and lower respiratory specimens during the acute phase of infection. The lowest concentration of SARS-CoV-2 viral copies this assay can detect is 250 copies / mL. A negative result does not preclude SARS-CoV-2 infection and should not be used as the sole basis for treatment or other patient management decisions.  A negative result may occur with improper specimen collection / handling, submission of specimen other than nasopharyngeal swab, presence of viral mutation(s) within the areas targeted by this assay, and inadequate number of viral  copies (<250 copies / mL). A negative result must be combined with clinical observations, patient history, and epidemiological information.  Fact Sheet for Patients:   StrictlyIdeas.no  Fact Sheet for Healthcare Providers: BankingDealers.co.za  This test is not yet approved or  cleared by the [place] FDA and has been authorized for detection and/or diagnosis of SARS-CoV-2 by FDA under an Emergency Use Authorization (EUA).  This EUA will remain in effect (meaning this test can be used) for the duration of the COVID-19 declaration under Section 564(b)(1) of the Act, 21 U.S.C. section 360bbb-3(b)(1), unless the authorization is terminated or revoked sooner.  Performed at [facility], [place] [address], [place], [place] [address]   C Difficile Quick Screen w PCR reflex     Status: None   Collection Time: 06/23/20  5:26 AM   Specimen: STOOL  Result Value Ref Range Status   C Diff antigen NEGATIVE NEGATIVE Final   C Diff toxin NEGATIVE NEGATIVE Final   C Diff interpretation No C. difficile detected.  Final    Comment: Performed at [facility], [place] [address] [place],  [address]         Radiology Studies: CT ABDOMEN PELVIS WO CONTRAST  Result Date: 06/22/2020 CLINICAL DATA:  Nausea and vomiting EXAM: CT ABDOMEN AND PELVIS WITHOUT CONTRAST TECHNIQUE: Multidetector CT imaging of the abdomen and pelvis was performed following the standard protocol without IV contrast. COMPARISON:  05/31/2020 FINDINGS: Lower chest: Emphysematous changes are noted. No focal infiltrate or sizable effusion is seen. Hepatobiliary: No focal liver abnormality is seen. Status post cholecystectomy. No biliary dilatation. Pancreas: Unremarkable. No pancreatic ductal dilatation or surrounding inflammatory changes. Spleen: Normal in size without focal abnormality. Adrenals/Urinary Tract: Adrenal glands are within normal  limits bilaterally. Bilateral nonobstructing renal calculi are seen. Prominent extrarenal pelvis is noted on the left. No obstructive changes are seen. The bladder is partially distended. Stomach/Bowel: Scattered diverticular change of the colon is noted without evidence of diverticulitis. Some fluid is noted within the large and small bowel likely related to a diarrheal state. No obstructive changes are seen stomach appears within normal limits. Vascular/Lymphatic: Aortic atherosclerosis. 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 No enlarged abdominal or pelvic lymph nodes. Reproductive: Prostate is unremarkable. Other: No abdominal wall hernia or abnormality. No abdominopelvic ascites. Musculoskeletal: Degenerative changes of lumbar spine are noted. No acute abnormality is seen. IMPRESSION: Bilateral nonobstructing renal calculi. Diverticulosis without diverticulitis. Mild fluid within the large and small bowel which may be related to an underlying diarrheal state. Electronically Signed   By: Inez Catalina M.D.   On: 06/22/2020 13:32  Scheduled Meds: . sodium chloride   Intravenous Once  . ascorbic acid  500 mg Oral Daily  . aspirin EC  81 mg Oral Daily  . bosutinib  400 mg Oral Q breakfast  . enoxaparin (LOVENOX) injection  30 mg Subcutaneous Q24H  . ezetimibe  10 mg Oral Daily  . feeding supplement (ENSURE ENLIVE)  237 mL Oral BID BM  . ferrous sulfate  325 mg Oral Daily  . nystatin  5 mL Oral QID  . pantoprazole  40 mg Oral BID AC  . cyanocobalamin  1,000 mcg Oral Daily   Continuous Infusions: . sodium chloride 75 mL/hr at 06/23/20 2151     LOS: 2 days   Time spent= 35 mins    Hiawatha Merriott Arsenio Loader, MD [facility]  If 7PM-7AM, please contact night-coverage  06/24/2020, 12:14 PM

## 2020-06-25 DIAGNOSIS — N1832 Chronic kidney disease, stage 3b: Secondary | ICD-10-CM

## 2020-06-25 LAB — BASIC METABOLIC PANEL
Anion gap: 7 (ref 5–15)
BUN: 16 mg/dL (ref 8–23)
CO2: 16 mmol/L — ABNORMAL LOW (ref 22–32)
Calcium: 7.1 mg/dL — ABNORMAL LOW (ref 8.9–10.3)
Chloride: 114 mmol/L — ABNORMAL HIGH (ref 98–111)
Creatinine, Ser: 1.64 mg/dL — ABNORMAL HIGH (ref 0.61–1.24)
GFR calc Af Amer: 43 mL/min — ABNORMAL LOW (ref >60)
GFR calc non Af Amer: 37 mL/min — ABNORMAL LOW (ref >60)
Glucose, Bld: 89 mg/dL (ref 70–99)
Potassium: 5.5 mmol/L — ABNORMAL HIGH (ref 3.5–5.1)
Sodium: 137 mmol/L (ref 135–145)

## 2020-06-25 LAB — CBC
HCT: 30.6 % — ABNORMAL LOW (ref 39.0–52.0)
Hemoglobin: 9.3 g/dL — ABNORMAL LOW (ref 13.0–17.0)
MCH: 27.8 pg (ref 26.0–34.0)
MCHC: 30.4 g/dL (ref 30.0–36.0)
MCV: 91.6 fL (ref 80.0–100.0)
Platelets: 217 10*3/uL (ref 150–400)
RBC: 3.34 MIL/uL — ABNORMAL LOW (ref 4.22–5.81)
RDW: 16.1 % — ABNORMAL HIGH (ref 11.5–15.5)
WBC: 12.7 10*3/uL — ABNORMAL HIGH (ref 4.0–10.5)
nRBC: 0 % (ref 0.0–0.2)

## 2020-06-25 LAB — TYPE AND SCREEN
ABO/RH(D): O POS
Antibody Screen: NEGATIVE
Unit division: 0

## 2020-06-25 LAB — FOLATE RBC
Folate, Hemolysate: 326 ng/mL
Folate, RBC: 1199 ng/mL (ref >498)
Hematocrit: 27.2 % — ABNORMAL LOW (ref 37.5–51.0)

## 2020-06-25 LAB — MAGNESIUM: Magnesium: 2 mg/dL (ref 1.7–2.4)

## 2020-06-25 LAB — BPAM RBC
Blood Product Expiration Date: 202109042359
ISSUE DATE / TIME: 202108051042
Unit Type and Rh: 5100

## 2020-06-25 MED ORDER — FUROSEMIDE 20 MG PO TABS: 10.0000 mg | ORAL_TABLET | Freq: Every day | ORAL | Status: DC | PRN

## 2020-06-25 MED ORDER — PANTOPRAZOLE SODIUM 40 MG PO TBEC: 40.0000 mg | DELAYED_RELEASE_TABLET | Freq: Two times a day (BID) | ORAL | 0 refills | Status: DC

## 2020-06-25 MED ORDER — SENNOSIDES-DOCUSATE SODIUM 8.6-50 MG PO TABS: 2.0000 | ORAL_TABLET | Freq: Every evening | ORAL | 0 refills | Status: DC | PRN

## 2020-06-25 NOTE — Discharge Instructions (Signed)
Heart Failure Action Plan A heart failure action plan helps you understand what to do when you have symptoms of heart failure. Follow the plan that was created by you and your health care provider. Review your plan each time you visit your health care provider. Red zone These signs and symptoms mean you should get medical help right away:  You have trouble breathing when resting.  You have a dry cough that is getting worse.  You have swelling or pain in your legs or abdomen that is getting worse.  You suddenly gain more than 2-3 lb (0.9-1.4 kg) in a day, or more than 5 lb (2.3 kg) in one week. This amount may be more or less depending on your condition.  You have trouble staying awake or you feel confused.  You have chest pain.  You do not have an appetite.  You pass out. If you experience any of these symptoms:  Call your local emergency services (911 in the [place] right away or seek help at the emergency department of the nearest hospital. Yellow zone These signs and symptoms mean your condition may be getting worse and you should make some changes:  You have trouble breathing when you are active or you need to sleep with extra pillows.  You have swelling in your legs or abdomen.  You gain 2-3 lb (0.9-1.4 kg) in one day, or 5 lb (2.3 kg) in one week. This amount may be more or less depending on your condition.  You get tired easily.  You have trouble sleeping.  You have a dry cough. If you experience any of these symptoms:  Contact your health care provider within the next day.  Your health care provider may adjust your medicines. Green zone These signs mean you are doing well and can continue what you are doing:  You do not have shortness of breath.  You have very little swelling or no new swelling.  Your weight is stable (no gain or loss).  You have a normal activity level.  You do not have chest pain or any other new symptoms. Follow these instructions at  home:  Take over-the-counter and prescription medicines only as told by your health care provider.  Weigh yourself daily. Your target weight is __________ lb (__________ kg). ? Call your health care provider if you gain more than __________ lb (__________ kg) in a day, or more than __________ lb (__________ kg) in one week.  Eat a heart-healthy diet. Work with a diet and nutrition specialist (dietitian) to create an eating plan that is best for you.  Keep all follow-up visits as told by your health care provider. This is important. Where to find more information  American Heart Association: www.heart.org Summary  Follow the action plan that was created by you and your health care provider.  Get help right away if you have any symptoms in the Red zone. This information is not intended to replace advice given to you by your health care provider. Make sure you discuss any questions you have with your health care provider. Document Revised: 10/19/2017 Document Reviewed: 12/16/2016 Elsevier Patient Education  2020 [employer]  

## 2020-06-25 NOTE — Discharge Summary (Signed)
Physician Discharge Summary  Hall Birchard HQI:[id number] DOB: 1930/08/28 DOA: 06/22/2020  PCP: Drake Leach, MD  Admit date: 06/22/2020 Discharge date: 06/25/2020  Admitted From: Home Disposition: Home  Recommendations for Outpatient Follow-up:  1. Follow up with PCP in 1-2 weeks 2. Please obtain BMP/magnesium levels in the next 3-4 days 3. Follow-up with Dr. Aundra Dubin from [place] team next week.  Appointment has been arranged by cardiology team. 4. Discontinue Coreg for now.  Lasix to be taken only as needed until seen by cardiology   Discharge Condition: Stable CODE STATUS: Full code Diet recommendation: Heart healthy diet  Brief/Interim Summary: 86 y.o.malewith medical history significant forCML, CKD stage III, CAD status post stent, chronic systolic CHF with EF 13%/KGMWNUUV cardiomyopathy,per lipidemia, history of a small bowel obstruction/volvulus-multiple surgeries comes to the ED for evaluation offatigue, diarrhea.  Recurrent admission from dehydration, AKI from his Lasix use.  Recently also had hospital course complicated by possible GI bleed diagnosed with Barrett's esophagus/erosive gastritis started on PPI.  After receiving IV fluid and PPI twice daily he started feeling much better..  Briefly discussed case with cardiology who recommended holding Coreg and only give Lasix as needed upon discharge and follow-up outpatient.  Outpatient follow-up arranged by their service already. Son is updated and all questions answered.  Stable for discharge today.   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  Admission creatinine 3.25, trended down to 2.4. Improving with fluids, down to 1.6 this morning. -Secondary to poor oral intake and Lasix use -Discussed with Cardiology, Dr Radford Pax. Recommending holding Cardiac meds- Coreg, doing lasix only prn and they will arrange for outpatient follow-up  Hyperkalemia -Given 1 dose of Lokelma, EKG unremarkable Repeat lab work next week.   Congestive heart failure reduced ejection fraction, 30% -Follows outpatient at CHF clinic. Continue to hold Lasix and only take as needed.  - Coreg on hold due tohypotension  Diarrhea, nonspecific, bloody -C. difficile-negative  History of coronary artery disease/ischemic cardiomyopathy -Home Coreg on hold -Cardiology consulted for medication adjustment and close outpatient follow-up  History of midgut volvulus -Status post exploratory laparotomy/enteropexy in October 2019  Barrett's esophagus with erosive gastritis/gastric and duodenal ulcer -PPI twice daily.  Pepcid.  History of CML. -Resume home meds  Iron deficiency anemia, hemoglobin down to 7.5 -Continue home p.o. regimen.  Status post 1 unit PRBC transfusion, 1 dose IV iron in the hospital as well. Hemoglobin improved to 9.3.   Adult failure to thrive/deconditioning, BMI around 16 -PT/OT evaluation-home PT OT -Consult nutrition, add boost    Body mass index is 16.24 kg/m.         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  Discharge Exam: Vitals:   06/24/20 2002 06/25/20 0326  BP: 116/61 (!) 110/55  Pulse: 69 63  Resp: 18 15  Temp: 98.1 F (36.7 C) 98 F (36.7 C)  SpO2: 96% 96%   Vitals:   06/24/20 1410 06/24/20 1517 06/24/20 2002 06/25/20 0326  BP: (!) 135/54 (!) 133/57 116/61 (!) 110/55  Pulse: 72 68 69 63  Resp: 18 17 18 15   Temp: 98.2 F (36.8 C) 98.3 F (36.8 C) 98.1 F (36.7 C) 98 F (36.7 C)  TempSrc: Oral  Oral Oral  SpO2: 98% 97% 96% 96%  Weight:      Height:        General: Pt is alert, awake, not in acute distress Cardiovascular: RRR, S1/S2 +, no rubs, no gallops Respiratory: CTA bilaterally, no wheezing, no rhonchi Abdominal: Soft, NT, ND, bowel sounds + Extremities: no edema, no cyanosis  Discharge Instructions   Allergies as of 06/25/2020      Reactions   Amoxicillin Nausea Only   Crestor [rosuvastatin] Itching   Percocet [oxycodone-acetaminophen] Itching   Spironolactone Other (See Comments)   Nipples sore and swelling   Metronidazole Other (See Comments)   Insomnia and nervousness      Medication List    STOP taking these medications   carvedilol 12.5 MG tablet Commonly known as: COREG     TAKE these medications   acetaminophen 325 MG tablet Commonly known as: TYLENOL Take 650 mg by mouth every 6 (six) hours as needed for mild pain or headache.   aspirin EC 81 MG tablet Take 1 tablet (81 mg total) by mouth daily.   Bosulif 400 MG tablet Generic drug: bosutinib Take 400 mg by mouth daily with breakfast.   clotrimazole 10 MG troche Commonly known as: MYCELEX Take 10 mg by mouth 5 (five) times daily. 10 day supply   cyanocobalamin 1000 MCG tablet Take 1,000 mcg by mouth daily.   diphenhydramine-acetaminophen 25-500 MG Tabs tablet Commonly known as: TYLENOL PM Take 2 tablets by mouth at bedtime as needed (sleep).   ezetimibe 10 MG tablet Commonly known as: ZETIA Take 1 tablet (10 mg total)  by mouth daily.   famotidine 20 MG tablet Commonly known as: PEPCID Take 20 mg by mouth as needed for heartburn or indigestion.   furosemide 20 MG tablet Commonly known as: LASIX Take 0.5 tablets (10 mg total) by mouth daily as needed for fluid or edema. What changed:   how much to take  when to take this  reasons to take this  additional instructions   GAS-X PO Take 1 tablet by mouth daily as needed (gas).   guaiFENesin-dextromethorphan 100-10 MG/5ML syrup Commonly known as: ROBITUSSIN DM Take 5 mLs by mouth every 4 (four) hours as needed for cough.   hydrocortisone cream 1 % Apply 1 application topically 2 (two) times daily as needed for itching.   Iron 325 (65 Fe) MG Tabs Take 325 mg by mouth daily.   loperamide 2 MG capsule Commonly known as: IMODIUM Take 2 mg by mouth as needed for diarrhea or loose stools.   pantoprazole 40 MG tablet Commonly known as: PROTONIX Take 1 tablet (40 mg total) by mouth 2 (two) times daily before a meal.   senna-docusate 8.6-50 MG tablet Commonly known as: Senokot-S Take 2 tablets by mouth at bedtime as needed for mild constipation or moderate constipation.   Vitamin C 500 MG Caps Take 500 mg by mouth daily.   Voltaren  1 % Gel Generic drug: diclofenac Sodium Apply 2 g topically 4 (four) times daily as needed (pain).       Follow-up Information    Larey Dresser, MD Follow up on 07/02/2020.   Specialty: Cardiology Why: Please arrive 15 minutes early for your 10am post-hospital cardiology follow-up appointment Contact information: [address] [place] [address] [phone]        Drake Leach, MD. Schedule an appointment as soon as possible for a visit in 1 week(s).   Specialty: Internal Medicine Contact information: [address] [place] [address] [phone]              Allergies  Allergen Reactions  . Amoxicillin Nausea Only  . Crestor [Rosuvastatin] Itching  .  Percocet [Oxycodone-Acetaminophen] Itching  . Spironolactone Other (See Comments)    Nipples sore and swelling   . Metronidazole Other (See Comments)    Insomnia and nervousness    You were cared for by a hospitalist during your hospital stay. If you have any questions about your discharge medications or the care you received while you were in the hospital after you are discharged, you can call the unit and asked to speak with the hospitalist on call if the hospitalist that took care of you is not available. Once you are discharged, your primary care physician will handle any further medical issues. Please note that no refills for any discharge medications will be authorized once you are discharged, as it is imperative that you return to your primary care physician (or establish a relationship with a primary care physician if you do not have one) for your aftercare needs so that they can reassess your need for medications and monitor your lab values.   Procedures/Studies: CT ABDOMEN PELVIS WO CONTRAST  Result Date: 06/22/2020 CLINICAL DATA:  Nausea and vomiting EXAM: CT ABDOMEN AND PELVIS WITHOUT CONTRAST TECHNIQUE: Multidetector CT imaging of the abdomen and pelvis was performed following the standard protocol without IV contrast. COMPARISON:  05/31/2020 FINDINGS: Lower chest: Emphysematous changes are noted. No focal infiltrate or sizable effusion is seen. Hepatobiliary: No focal liver abnormality is seen. Status post cholecystectomy. No biliary dilatation. Pancreas: Unremarkable. No pancreatic ductal dilatation or surrounding inflammatory changes. Spleen: Normal in size without focal abnormality. Adrenals/Urinary Tract: Adrenal glands are within normal limits bilaterally. Bilateral nonobstructing renal calculi are seen. Prominent extrarenal pelvis is noted on the left. No obstructive changes are seen. The bladder is partially distended. Stomach/Bowel: Scattered diverticular change of the colon is  noted without evidence of diverticulitis. Some fluid is noted within the large and small bowel likely related to a diarrheal state. No obstructive changes are seen stomach appears within normal limits. Vascular/Lymphatic: Aortic atherosclerosis. 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 No enlarged abdominal or pelvic lymph nodes. Reproductive: Prostate is unremarkable. Other: No abdominal wall hernia or abnormality. No abdominopelvic ascites. Musculoskeletal: Degenerative changes of lumbar spine are noted. No acute abnormality is seen. IMPRESSION: Bilateral nonobstructing renal calculi. Diverticulosis without diverticulitis. Mild fluid within the large and small bowel which may be related to an underlying diarrheal state. Electronically Signed   By: Inez Catalina M.D.   On: 06/22/2020 13:32   CT ABDOMEN PELVIS WO CONTRAST  Result Date: 05/31/2020 CLINICAL DATA:  Pneumonia, nausea, diarrhea EXAM: CT CHEST, ABDOMEN AND PELVIS WITHOUT CONTRAST TECHNIQUE: Multidetector CT imaging of the chest, abdomen and pelvis was performed following the standard protocol without IV contrast. COMPARISON:  None. 07/06/2016 FINDINGS: CT  CHEST FINDINGS Cardiovascular: There is extensive coronary artery calcification identified. Coronary artery stenting involving the proximal and mid left anterior descending coronary artery has been performed. Global cardiac size within normal limits. Moderate calcification of the mitral valve annulus. Mild calcification of the aortic valve leaflets. No pericardial effusion. Central pulmonary arteries are moderately enlarged in keeping with changes of pulmonary arterial hypertension. Atherosclerotic calcification is seen within the aortic arch and arch vasculature at their origins. No aneurysm. Mediastinum/Nodes: Shotty precarinal and aortopulmonary lymphadenopathy has progressed since prior examination, likely reactive in nature. No frankly pathologic adenopathy is identified  within the thorax. Lungs/Pleura: There is severe centrilobular emphysema again identified. The lungs are symmetrically hyperinflated. Bullous change noted at the lung apices. There is superimpose infiltrate noted within the upper lobes bilaterally, asymmetrically more severe within the left, most in keeping with superimposed acute infection. An 8 mm nodule has developed within the right upper lobe, on image 57/3, new since prior examination and indeterminate. 11 mm nodular lesion within the right upper lobe, axial image 50 is stable.3 mm nodule within the left lower lobe, image 47/3, is also stable and safely considered benign Musculoskeletal: No lytic or blastic bone lesions. CT ABDOMEN PELVIS FINDINGS Hepatobiliary: Cholecystectomy has been performed. Marked extrahepatic and moderate intrahepatic biliary ductal dilation may represent post cholecystectomy change, but appears progressive since prior examination. The liver is otherwise unremarkable. Pancreas: Unremarkable Spleen: Unremarkable Adrenals/Urinary Tract: The adrenal glands are unremarkable. The kidneys are normal in size and position. There is asymmetric mild left renal cortical atrophy. Multiple nonobstructing calculi are identified bilaterally measuring up to 7 mm within the lower pole of the left kidney. There is no hydronephrosis. No ureteral calculi. The bladder is unremarkable. Stomach/Bowel: Surgical changes of partial small bowel resection are identified. The small bowel demonstrates several proximal loops of fluid-filled marginally dilated loops without a discrete point of transition, a nonspecific finding that can be seen in the setting of infectious or inflammatory enteritis. There is, however, no evidence of obstruction. There is no free intraperitoneal gas or fluid. The distal colon and rectum demonstrate air-fluid levels, a finding that may present clinically as diarrhea. The stomach is unremarkable. Vascular/Lymphatic: There is extensive  aortoiliac atherosclerotic calcification. 2.9 cm infrarenal abdominal aortic aneurysm is present. There is no pathologic adenopathy within the abdomen and pelvis. Reproductive: Unremarkable Other: Diffuse body wall wasting noted. Musculoskeletal: Osseous structures are age-appropriate. No lytic or blastic bone lesions. IMPRESSION: Severe emphysema. Superimposed bilateral upper lobe pneumonic infiltrate, asymmetrically more severe within the left upper lobe. No central obstructing mass. New 8 mm pulmonary nodule within the right upper lobe. Non-contrast chest CT at 6-12 months is recommended. If the nodule is stable at time of repeat CT, then future CT at 18-24 months (from today's scan) is considered optional for low-risk patients, but is recommended for high-risk patients. This recommendation follows the consensus statement: Guidelines for Management of Incidental Pulmonary Nodules Detected on CT Images: From the Fleischner Society 2017; Radiology 2017; 284:228-243. Bilateral nonobstructing nephrolithiasis. Coronary artery and peripheral vascular disease. 2.9 cm infrarenal abdominal aortic aneurysm. Aortic Atherosclerosis (ICD10-I70.0) and Emphysema (ICD10-J43.9). Electronically Signed   By: Fidela Salisbury MD   On: 05/31/2020 17:41   DG Chest 2 View  Result Date: 06/03/2020 CLINICAL DATA:  Hypoxia EXAM: CHEST - 2 VIEW COMPARISON:  05/31/2020 CT. 05/27/2020 chest radiograph from [facility]. FINDINGS: Mild hyperinflation. Patient rotated minimally right. Normal heart size. Atherosclerosis in the transverse aorta. No pleural effusion or pneumothorax. Diffuse interstitial thickening.  Left greater than right upper lobe airspace disease is slightly increased. Right lower lobe airspace disease is similar. IMPRESSION: Left greater than right airspace disease, overall progressive. Favor pneumonia. Underlying interstitial thickening likely relates to emphysema/chronic bronchitis. Aortic Atherosclerosis  (ICD10-I70.0). Electronically Signed   By: Abigail Miyamoto M.D.   On: 06/03/2020 16:28   CT Chest Wo Contrast  Result Date: 05/31/2020 CLINICAL DATA:  Pneumonia, nausea, diarrhea EXAM: CT CHEST, ABDOMEN AND PELVIS WITHOUT CONTRAST TECHNIQUE: Multidetector CT imaging of the chest, abdomen and pelvis was performed following the standard protocol without IV contrast. COMPARISON:  None. 07/06/2016 FINDINGS: CT CHEST FINDINGS Cardiovascular: There is extensive coronary artery calcification identified. Coronary artery stenting involving the proximal and mid left anterior descending coronary artery has been performed. Global cardiac size within normal limits. Moderate calcification of the mitral valve annulus. Mild calcification of the aortic valve leaflets. No pericardial effusion. Central pulmonary arteries are moderately enlarged in keeping with changes of pulmonary arterial hypertension. Atherosclerotic calcification is seen within the aortic arch and arch vasculature at their origins. No aneurysm. Mediastinum/Nodes: Shotty precarinal and aortopulmonary lymphadenopathy has progressed since prior examination, likely reactive in nature. No frankly pathologic adenopathy is identified within the thorax. Lungs/Pleura: There is severe centrilobular emphysema again identified. The lungs are symmetrically hyperinflated. Bullous change noted at the lung apices. There is superimpose infiltrate noted within the upper lobes bilaterally, asymmetrically more severe within the left, most in keeping with superimposed acute infection. An 8 mm nodule has developed within the right upper lobe, on image 57/3, new since prior examination and indeterminate. 11 mm nodular lesion within the right upper lobe, axial image 50 is stable.3 mm nodule within the left lower lobe, image 47/3, is also stable and safely considered benign Musculoskeletal: No lytic or blastic bone lesions. CT ABDOMEN PELVIS FINDINGS Hepatobiliary: Cholecystectomy has  been performed. Marked extrahepatic and moderate intrahepatic biliary ductal dilation may represent post cholecystectomy change, but appears progressive since prior examination. The liver is otherwise unremarkable. Pancreas: Unremarkable Spleen: Unremarkable Adrenals/Urinary Tract: The adrenal glands are unremarkable. The kidneys are normal in size and position. There is asymmetric mild left renal cortical atrophy. Multiple nonobstructing calculi are identified bilaterally measuring up to 7 mm within the lower pole of the left kidney. There is no hydronephrosis. No ureteral calculi. The bladder is unremarkable. Stomach/Bowel: Surgical changes of partial small bowel resection are identified. The small bowel demonstrates several proximal loops of fluid-filled marginally dilated loops without a discrete point of transition, a nonspecific finding that can be seen in the setting of infectious or inflammatory enteritis. There is, however, no evidence of obstruction. There is no free intraperitoneal gas or fluid. The distal colon and rectum demonstrate air-fluid levels, a finding that may present clinically as diarrhea. The stomach is unremarkable. Vascular/Lymphatic: There is extensive aortoiliac atherosclerotic calcification. 2.9 cm infrarenal abdominal aortic aneurysm is present. There is no pathologic adenopathy within the abdomen and pelvis. Reproductive: Unremarkable Other: Diffuse body wall wasting noted. Musculoskeletal: Osseous structures are age-appropriate. No lytic or blastic bone lesions. IMPRESSION: Severe emphysema. Superimposed bilateral upper lobe pneumonic infiltrate, asymmetrically more severe within the left upper lobe. No central obstructing mass. New 8 mm pulmonary nodule within the right upper lobe. Non-contrast chest CT at 6-12 months is recommended. If the nodule is stable at time of repeat CT, then future CT at 18-24 months (from today's scan) is considered optional for low-risk patients, but is  recommended for high-risk patients. This recommendation follows the consensus statement: Guidelines for Management  of Incidental Pulmonary Nodules Detected on CT Images: From the Fleischner Society 2017; Radiology 2017; [phone]. Bilateral nonobstructing nephrolithiasis. Coronary artery and peripheral vascular disease. 2.9 cm infrarenal abdominal aortic aneurysm. Aortic Atherosclerosis (ICD10-I70.0) and Emphysema (ICD10-J43.9). Electronically Signed   By: Fidela Salisbury MD   On: 05/31/2020 17:41   DG CHEST PORT 1 VIEW  Result Date: 06/05/2020 CLINICAL DATA:  Shortness of breath. EXAM: PORTABLE CHEST 1 VIEW COMPARISON:  06/03/2020 and chest CT 05/31/2020 FINDINGS: Increased densities in the left upper lung compatible with worsening airspace disease in this region. Patchy densities in the right upper lung have minimally changed. Heart size is within normal limits and stable. Trachea is midline. Concern for slightly increased densities at the medial left lung base. IMPRESSION: Increased opacification in the left upper lung is suggestive for increased consolidation and worsening pneumonia. Patchy densities in the right upper lung have minimally changed. Slightly increased densities at the medial left lung base could represent developing disease in this area. Recommend attention on follow-up. Electronically Signed   By: Markus Daft M.D.   On: 06/05/2020 12:57   DG ABD ACUTE 2+V W 1V CHEST  Result Date: 06/22/2020 CLINICAL DATA:  Vomiting EXAM: DG ABDOMEN ACUTE W/ 1V CHEST COMPARISON:  July 12-30, 2021 FINDINGS: Diffuse gaseous distention of bowel without dilation. Air is visualized in the rectum. No free air. Nonspecific scattered air-fluid levels. Unchanged cardiomediastinal silhouette. Emphysematous changes with LEFT greater than RIGHT upper lobe heterogeneous opacities, mildly increased in comparison to prior. No pleural effusion or pneumothorax. Surgical sutures in the LEFT upper quadrant. Vascular  calcifications. Status post cholecystectomy. Degenerative changes of the lower lumbar spine with transitional anatomy of L5-S1 with bilateral assimilation joints. IMPRESSION: 1. Diffuse gaseous distention of bowel without dilation or free air. Extent of distension is similar in comparison to May 31, 2020. This may reflect a nonspecific gastroenteritis. If persistent concern for obstruction, recommend dedicated CT. 2. Mildly increased bilateral upper lobe heterogeneous opacities may reflect increased infection or inflammation. Recommend follow-up radiograph in 4-6 weeks to assess for resolution. Electronically Signed   By: Valentino Saxon MD   On: 06/22/2020 10:05      The results of significant diagnostics from this hospitalization (including imaging, microbiology, ancillary and laboratory) are listed below for reference.     Microbiology: Recent Results (from the past 240 hour(s))  SARS Coronavirus 2 by RT PCR (hospital order, performed in [facility] hospital lab) Nasopharyngeal Nasopharyngeal Swab     Status: None   Collection Time: 06/22/20 11:43 AM   Specimen: Nasopharyngeal Swab  Result Value Ref Range Status   SARS Coronavirus 2 NEGATIVE NEGATIVE Final    Comment: (NOTE) SARS-CoV-2 target nucleic acids are NOT DETECTED.  The SARS-CoV-2 RNA is generally detectable in upper and lower respiratory specimens during the acute phase of infection. The lowest concentration of SARS-CoV-2 viral copies this assay can detect is 250 copies / mL. A negative result does not preclude SARS-CoV-2 infection and should not be used as the sole basis for treatment or other patient management decisions.  A negative result may occur with improper specimen collection / handling, submission of specimen other than nasopharyngeal swab, presence of viral mutation(s) within the areas targeted by this assay, and inadequate number of viral copies (<250 copies / mL). A negative result must be combined with  clinical observations, patient history, and epidemiological information.  Fact Sheet for Patients:   StrictlyIdeas.no  Fact Sheet for Healthcare Providers: BankingDealers.co.za  This test is not yet  approved or  cleared by the [place] and has been authorized for detection and/or diagnosis of SARS-CoV-2 by FDA under an Emergency Use Authorization (EUA).  This EUA will remain in effect (meaning this test can be used) for the duration of the COVID-19 declaration under Section 564(b)(1) of the Act, 21 U.S.C. section 360bbb-3(b)(1), unless the authorization is terminated or revoked sooner.  Performed at [facility], [place] [address] [place], [place] [address]   C Difficile Quick Screen w PCR reflex     Status: None   Collection Time: 06/23/20  5:26 AM   Specimen: STOOL  Result Value Ref Range Status   C Diff antigen NEGATIVE NEGATIVE Final   C Diff toxin NEGATIVE NEGATIVE Final   C Diff interpretation No C. difficile detected.  Final    Comment: Performed at [facility], [place] [address], [place], [place] [address]     Labs: BNP (last 3 results) Recent Labs    06/23/20 0350  BNP 983.3*   Basic Metabolic Panel: Recent Labs  Lab 06/22/20 1504 06/22/20 2048 06/23/20 0350 06/24/20 0339 06/25/20 0336  NA 133* 136 133* 131* 137  K 5.6* 5.3* 4.8 5.6* 5.5*  CL 106 109 109 111 114*  CO2 20* 18* 18* 17* 16*  GLUCOSE 90 115* 109* 95 89  BUN 44* 43* 40* 24* 16  CREATININE 2.94* 2.89* 2.46* 1.91* 1.64*  CALCIUM 7.7* 7.7* 7.2* 6.8* 7.1*  MG  --   --   --  2.1 2.0   Liver Function Tests: Recent Labs  Lab 06/22/20 0850  AST 13*  ALT 14  ALKPHOS 107  BILITOT 0.7  PROT 7.5  ALBUMIN 3.0*   No results for input(s): LIPASE, AMYLASE in the last 168 hours. No results for input(s): AMMONIA in the last 168 hours. CBC: Recent Labs  Lab 06/22/20 0850 06/23/20 0350 06/24/20 0339  06/25/20 0336  WBC 23.9* 15.0* 13.5* 12.7*  NEUTROABS 20.1*  --   --   --   HGB 11.1* 8.4* 7.5* 9.3*  HCT 36.9* 29.0* 24.9* 30.6*  MCV 91.6 94.5 91.5 91.6  PLT 329 242 224 217   Cardiac Enzymes: No results for input(s): CKTOTAL, CKMB, CKMBINDEX, TROPONINI in the last 168 hours. BNP: Invalid input(s): POCBNP CBG: No results for input(s): GLUCAP in the last 168 hours. D-Dimer No results for input(s): DDIMER in the last 72 hours. Hgb A1c No results for input(s): HGBA1C in the last 72 hours. Lipid Profile No results for input(s): CHOL, HDL, LDLCALC, TRIG, CHOLHDL, LDLDIRECT in the last 72 hours. Thyroid function studies Recent Labs    06/24/20 0823  TSH 5.284*   Anemia work up Recent Labs    06/24/20 0823  VITAMINB12 6,702*  FERRITIN 179  TIBC 212*  IRON 17*   Urinalysis    Component Value Date/Time   COLORURINE YELLOW 06/23/2020 0448   APPEARANCEUR CLEAR 06/23/2020 0448   LABSPEC 1.013 06/23/2020 0448   PHURINE 5.0 06/23/2020 0448   GLUCOSEU NEGATIVE 06/23/2020 0448   HGBUR NEGATIVE 06/23/2020 0448   BILIRUBINUR NEGATIVE 06/23/2020 0448   KETONESUR NEGATIVE 06/23/2020 0448   PROTEINUR NEGATIVE 06/23/2020 0448   UROBILINOGEN 0.2 09/06/2015 1935   NITRITE NEGATIVE 06/23/2020 0448   LEUKOCYTESUR NEGATIVE 06/23/2020 0448   Sepsis Labs Invalid input(s): PROCALCITONIN,  WBC,  LACTICIDVEN Microbiology Recent Results (from the past 240 hour(s))  SARS Coronavirus 2 by RT PCR (hospital order, performed in [facility] hospital lab) Nasopharyngeal Nasopharyngeal Swab  Status: None   Collection Time: 06/22/20 11:43 AM   Specimen: Nasopharyngeal Swab  Result Value Ref Range Status   SARS Coronavirus 2 NEGATIVE NEGATIVE Final    Comment: (NOTE) SARS-CoV-2 target nucleic acids are NOT DETECTED.  The SARS-CoV-2 RNA is generally detectable in upper and lower respiratory specimens during the acute phase of infection. The lowest concentration of SARS-CoV-2 viral copies  this assay can detect is 250 copies / mL. A negative result does not preclude SARS-CoV-2 infection and should not be used as the sole basis for treatment or other patient management decisions.  A negative result may occur with improper specimen collection / handling, submission of specimen other than nasopharyngeal swab, presence of viral mutation(s) within the areas targeted by this assay, and inadequate number of viral copies (<250 copies / mL). A negative result must be combined with clinical observations, patient history, and epidemiological information.  Fact Sheet for Patients:   StrictlyIdeas.no  Fact Sheet for Healthcare Providers: BankingDealers.co.za  This test is not yet approved or  cleared by the [place] FDA and has been authorized for detection and/or diagnosis of SARS-CoV-2 by FDA under an Emergency Use Authorization (EUA).  This EUA will remain in effect (meaning this test can be used) for the duration of the COVID-19 declaration under Section 564(b)(1) of the Act, 21 U.S.C. section 360bbb-3(b)(1), unless the authorization is terminated or revoked sooner.  Performed at [facility], [place] [address], [place], [place] [address]   C Difficile Quick Screen w PCR reflex     Status: None   Collection Time: 06/23/20  5:26 AM   Specimen: STOOL  Result Value Ref Range Status   C Diff antigen NEGATIVE NEGATIVE Final   C Diff toxin NEGATIVE NEGATIVE Final   C Diff interpretation No C. difficile detected.  Final    Comment: Performed at [facility], [place] [address], [place], [place] [address]     Time coordinating discharge:  I have spent 35 minutes face to face with the patient and on the ward discussing the patients care, assessment, plan and disposition with other care givers. >50% of the time was devoted counseling the patient about the risks and benefits of treatment/Discharge  disposition and coordinating care.   SIGNED:   Damita Lack, MD  [facility] 06/25/2020, 10:03 AM   If 7PM-7AM, please contact night-coverage

## 2020-07-02 ENCOUNTER — Ambulatory Visit (HOSPITAL_COMMUNITY)
Admit: 2020-07-02 | Discharge: 2020-07-02 | Disposition: A | Discharge status: Home / Self Care | Payer: Medicare HMO | Source: Ambulatory Visit | Attending: Cardiology | Admitting: Cardiology

## 2020-07-02 ENCOUNTER — Other Ambulatory Visit: Payer: Self-pay

## 2020-07-02 ENCOUNTER — Encounter (HOSPITAL_COMMUNITY): Payer: Self-pay | Admitting: Cardiology

## 2020-07-02 VITALS — BP 122/60 | HR 81 | Wt 116.0 lb

## 2020-07-02 DIAGNOSIS — Z955 Presence of coronary angioplasty implant and graft: Secondary | ICD-10-CM | POA: Insufficient documentation

## 2020-07-02 DIAGNOSIS — Z87891 Personal history of nicotine dependence: Secondary | ICD-10-CM | POA: Insufficient documentation

## 2020-07-02 DIAGNOSIS — I5022 Chronic systolic (congestive) heart failure: Secondary | ICD-10-CM | POA: Insufficient documentation

## 2020-07-02 DIAGNOSIS — G4733 Obstructive sleep apnea (adult) (pediatric): Secondary | ICD-10-CM | POA: Diagnosis not present

## 2020-07-02 DIAGNOSIS — Z79899 Other long term (current) drug therapy: Secondary | ICD-10-CM | POA: Insufficient documentation

## 2020-07-02 DIAGNOSIS — I6523 Occlusion and stenosis of bilateral carotid arteries: Secondary | ICD-10-CM | POA: Diagnosis not present

## 2020-07-02 DIAGNOSIS — E785 Hyperlipidemia, unspecified: Secondary | ICD-10-CM | POA: Insufficient documentation

## 2020-07-02 DIAGNOSIS — I2510 Atherosclerotic heart disease of native coronary artery without angina pectoris: Secondary | ICD-10-CM | POA: Insufficient documentation

## 2020-07-02 DIAGNOSIS — Z7982 Long term (current) use of aspirin: Secondary | ICD-10-CM | POA: Insufficient documentation

## 2020-07-02 DIAGNOSIS — Z7901 Long term (current) use of anticoagulants: Secondary | ICD-10-CM | POA: Insufficient documentation

## 2020-07-02 DIAGNOSIS — I255 Ischemic cardiomyopathy: Secondary | ICD-10-CM | POA: Insufficient documentation

## 2020-07-02 DIAGNOSIS — N183 Chronic kidney disease, stage 3 unspecified: Secondary | ICD-10-CM | POA: Diagnosis not present

## 2020-07-02 LAB — BASIC METABOLIC PANEL
Anion gap: 10 (ref 5–15)
BUN: 14 mg/dL (ref 8–23)
CO2: 20 mmol/L — ABNORMAL LOW (ref 22–32)
Calcium: 7.9 mg/dL — ABNORMAL LOW (ref 8.9–10.3)
Chloride: 109 mmol/L (ref 98–111)
Creatinine, Ser: 1.41 mg/dL — ABNORMAL HIGH (ref 0.61–1.24)
GFR calc Af Amer: 52 mL/min — ABNORMAL LOW (ref >60)
GFR calc non Af Amer: 44 mL/min — ABNORMAL LOW (ref >60)
Glucose, Bld: 99 mg/dL (ref 70–99)
Potassium: 4.4 mmol/L (ref 3.5–5.1)
Sodium: 139 mmol/L (ref 135–145)

## 2020-07-02 MED ORDER — FUROSEMIDE 20 MG PO TABS
20.0000 mg | ORAL_TABLET | Freq: Every day | ORAL | 3 refills | Status: DC | PRN
Start: 2020-07-02 — End: 2020-07-16

## 2020-07-02 MED ORDER — CARVEDILOL 6.25 MG PO TABS
6.2500 mg | ORAL_TABLET | Freq: Two times a day (BID) | ORAL | 3 refills | Status: AC
Start: 2020-07-02 — End: ?

## 2020-07-02 NOTE — Patient Instructions (Addendum)
Start Carvedilol 6.25 mg Twice daily   Take Furosemide 20 mg AS NEEDED for 3 lb weight gain in a week, can take 2 days in a row  Labs done today, your results will be available in MyChart, we will contact you for abnormal readings.  Your physician has requested that you have a carotid duplex. This test is an ultrasound of the carotid arteries in your neck. It looks at blood flow through these arteries that supply the brain with blood. Allow one hour for this exam. There are no restrictions or special instructions.  Your physician recommends that you schedule a follow-up appointment in: 3 months with echocardiogram  If you have any questions or concerns before your next appointment please send [place] a message through [place] or call our office at [phone].    TO LEAVE A MESSAGE FOR THE NURSE SELECT OPTION 2, PLEASE LEAVE A MESSAGE INCLUDING: . YOUR NAME . DATE OF BIRTH . CALL BACK NUMBER . REASON FOR CALL**this is important as we prioritize the call backs  [place] AS LONG AS YOU CALL BEFORE 4:00 PM  At the [facility], you and your health needs are our priority. As part of our continuing mission to provide you with exceptional heart care, we have created designated Provider Care Teams. These Care Teams include your primary Cardiologist (physician) and Advanced Practice Providers (APPs- Physician Assistants and Nurse Practitioners) who all work together to provide you with the care you need, when you need it.   You may see any of the following providers on your designated Care Team at your next follow up: Marland Kitchen Dr Glori Bickers . Dr Loralie Champagne . Darrick Grinder, NP . Lyda Jester, PA . Audry Riles, PharmD   Please be sure to bring in all your medications bottles to every appointment.

## 2020-07-03 NOTE — Progress Notes (Signed)
Advanced Heart Failure Clinic Note   PCP: Dr. Sallyanne Havers [facility]) Cardiology: Dr. Aundra Dubin  86 y.o. male with history of recurrent small bowel obstruction and CAD with ischemic cardiomyopathy presents for followup.  In 9/14, patient was hospitalized with recurrent abdominal pain from small bowel obstruction.  This was managed conservatively with NG tube/NPO and IV fluid.  In the hospital, the patient developed pulmonary edema and actually ended up being intubated.  Troponin was normal. Echo was done showing EF 35-40% with anteroseptal, anterolateral, and apical akinesis along with moderate AI.  He had no prior known cardiac disease.  It was thought that the patient had had an out of hospital MI at some point then developed pulmonary edema with IVF while NPO.  He was diuresed and extubated.  Creatinine was up to about 1.6 in the hospital so no cardiac cath was done (not thought to be urgent given normal creatinine).   I took him for [facility] in 11/14.  This showed 95% mid LAD stenosis that was treated with 2 overlapping Promus DES.  Echo in 2/15 showed EF 30-35% with normal RV.  Coreg was cut back to 6.25 bid due to bradycardia and fatigue.   Had R CEA in 11/16 with Dr Trula Slade. He developed gynecomastia and had to stop spironolactone.  Eplerenone caused nausea/vomiting.    He was admitted in 12/18 with SBO, had ex lap with lysis of adhesions.  He was admitted again in 2/19 with SBO, again with ex lap and lysis of adhesions.    Lasix was stopped due to orthostatic symptoms, and he was admitted in 4/19 with dyspnea, found to have CHF.  He got IV Lasix and was restarted on po Lasix for home.   Echo in 4/19 with EF 30-35%, moderate LVH, mild to moderate AI, moderate MR.    In 5/21, he was admitted with recurrent SBO. In 7/21, he was admitted with PNA and GI bleeding from PUD.  In 8/21, he was admitted with AKI in setting of poor po intake (creatinine up to 3.25).    He continues to follow with oncology  for treatment of CML.   He returns today for HF follow up. He is off Lasix, losartan, and Coreg since admission with AKI/dehydration.  He uses a walker at times for balance.  No dyspnea walking on flat ground. No chest pain.  Still leaves alone but family helps out. Weight down 10 lbs since last appointment but getting his appetite back. Ankles swell by the end of the day but resolve by the morning.  No further BRBPR/melena.   ECG (personally reviewed): NSR, anterolateral TW changes   Labs (9/14): K 3.4, creatinine 1.55 => 1.4, LDL 89, HDL 38 Labs (11/14): K 4.9, creatinine 1.6 Labs (1/15): K 4.1, creatinine 1.4 Labs (2/15): LDL 34, HDL 32 Labs (2/16): K 4.5, creatinine 1.5 Labs (8/16): LDL 26, HDL 33 Labs (11/16): K 3.4, creatinine 1.49 Labs (4/17): K 4, creatinine 1.65 Labs (4/18): K 4.4, creatinine 1.4 Labs (5/18): K 4.3, creatinine 1.68, LDL 21, HDL 23 Labs (4/19): K 4.2, creatinine 1.4, LDL 72 Labs (4/21): LDL 52 Labs (8/21): K 5.5, creatinine 1.6, hgb 9.3  PMH: 1. Nephrolithiasis 2. H/o CCY 3. H/o diverticulitis 4. H/o SBO: initially in 1/14 with small bowel resection, again in 9/14 treated conservatively.  Ex laps for lysis of adhesions in 12/18 and 2/19.  5. Ischemic cardiomyopathy: Echo (9/14) with EF 35-40%, anteroseptal/anterolateral/apical akinesis and moderate AI.  Patient had acute pulmonary  edema while hospitalized in 9/14. Echo (2/15) with EF 30-35% with wall motion abnormalities, normal RV size and systolic function, mild MR and mild AI.  - Echo (4/19): EF 30-35% with regional wall motion abnormalities, moderate LVH, mild-moderate AI, moderate MR, PASP 59 mmHg.  6. CKD stage 3 7. Aortic insufficiency: mild on last echo 8. CAD: LHC (11/14) with 95% mLAD stenosis treated with 2 overlapping Promus DES.  There was residual 50% stenosis beyond the stent margin.   9. Carotid stenosis: 2/16 carotid dopplers with 80-99% RICA stenosis, 16-10% LICA stenosis. CTA neck showed 90%  RICA stenosis. Right CEA 10/16.  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 COPD: Emphysema on chest CT.  12. Hyperlipidemia: Myalgias with atorvastatin and Crestor.  13. OSA: Does not want CPAP.   SH: widower (2nd time) with 4 children.  Lives in [place].  Previous heavy smoker.  Retired Chief Financial Officer.   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   Review of systems complete and found to be negative unless listed in HPI.     Current Outpatient Medications  Medication Sig Dispense Refill  . acetaminophen (TYLENOL) 325 MG tablet Take 650 mg by mouth every 6 (six) hours as needed for mild pain or headache.    . Ascorbic Acid (VITAMIN C) 500 MG CAPS Take 500 mg by mouth daily.     Marland Kitchen aspirin EC 81 MG tablet Take 1 tablet (81 mg total) by mouth daily. 90 tablet 3  . BOSULIF 400 MG tablet Take 400 mg by mouth daily with breakfast.    . cyanocobalamin 1000 MCG tablet Take 1,000 mcg by mouth daily.    . diclofenac Sodium (VOLTAREN) 1 % GEL Apply 2 g topically 4 (four) times daily as needed (pain).    Marland Kitchen diphenhydramine-acetaminophen (TYLENOL PM) 25-500 MG TABS tablet Take 2 tablets by mouth at bedtime as needed (sleep).     . ezetimibe (ZETIA) 10 MG tablet Take 1 tablet (10 mg total) by mouth daily. 90 tablet 3  . Ferrous Sulfate (IRON) 325 (65 Fe) MG TABS Take 325 mg by mouth daily.     Marland Kitchen guaiFENesin-dextromethorphan (ROBITUSSIN DM) 100-10 MG/5ML syrup Take 5 mLs by mouth every 4 (four) hours as needed for cough. 118 mL 0  . hydrocortisone cream 1 % Apply 1 application topically 2 (two) times daily as needed for itching.    . loperamide (IMODIUM) 2 MG capsule Take 2 mg by mouth as needed for diarrhea or loose stools.     . nystatin (MYCOSTATIN) 100000 UNIT/ML suspension Take by mouth.    . pantoprazole (PROTONIX) 40 MG tablet Take 1 tablet (40 mg total) by mouth 2 (two) times daily before a meal. 60 tablet 0  . Simethicone (GAS-X PO) Take 1 tablet by mouth daily as needed (gas).     . carvedilol (COREG)  6.25 MG tablet Take 1 tablet (6.25 mg total) by mouth 2 (two) times daily. 60 tablet 3  . furosemide (LASIX) 20 MG tablet Take 1 tablet (20 mg total) by mouth daily as needed. 30 tablet 3   No current facility-administered medications for this encounter.    BP 122/60   Pulse 81   Wt 52.6 kg (116 lb)   SpO2 98%   BMI 17.13 kg/m  General: NAD Neck: No JVD, no thyromegaly or thyroid nodule.  Lungs: Clear to auscultation bilaterally with normal respiratory effort. CV: Nondisplaced PMI.  Heart regular S1/S2, no S3/S4, 1/6 SEM RUSB.  No peripheral edema.  No carotid  bruit.  Normal pedal pulses.  Abdomen: Soft, nontender, no hepatosplenomegaly, no distention.  Skin: Intact without lesions or rashes.  Neurologic: Alert and oriented x 3.  Psych: Normal affect. Extremities: No clubbing or cyanosis.  HEENT: Normal.   Assessment/Plan: 1. Chronic systolic CHF: Ischemic cardiomyopathy, EF 30-35% on 4/19 echo. Volume status looks ok.  NYHA class I-II symptoms. Unable to take spironolactone (gynecomastia) or eplerenone (nausea/vomiting).  He has been off losartan and Coreg as well as Lasix since 8/21 admission with AKI. - Restart Coreg 6.25 mg bid. He can stay off losartan for now.  - No ICD given age.  - He will use Lasix 20 mg daily x 2 dose prn weight gain of 3 lbs in a week.  - I will arrange for echocardiogram.  2. CAD: S/p DES to mLAD with overlapping Promus stents in 11/14.  Of note, there was residual 50% stenosis just beyond the stent margin.  No ischemic symptoms.  - Continue ASA 81 mg daily.  - Myalgias with atorvastatin and crestor. Now taking Zetia.  3. Hyperlipidemia: Myalgias with atorvastatin and Crestor, now taking Zetia. LDL 52 in 4/21.  4. CKD: Stage 3, recent AKI. BMET today.  5. Carotid stenosis: s/p R CEA 09/2015 with Dr Trula Slade.  - I will arrange for carotid dopplers.   6. OSA: mild OSA on sleep study. Not interested in CPAP.   He can followup in 3 months.   Loralie Champagne 07/03/2020

## 2020-07-06 ENCOUNTER — Telehealth (HOSPITAL_COMMUNITY): Payer: Self-pay | Admitting: *Deleted

## 2020-07-06 NOTE — Telephone Encounter (Signed)
pts daughter left vm to r/s echo so that she could be at the appt with him. I called back to r/s no answer/left msg for return call.

## 2020-07-14 ENCOUNTER — Telehealth (HOSPITAL_COMMUNITY): Payer: Self-pay | Admitting: Cardiology

## 2020-07-14 NOTE — Telephone Encounter (Signed)
Pt daughter(Beverly) called to r/s dad Carotid appt, please call her back before another day is scheduled, she has to compare with her Dr's appt as well. thanks

## 2020-07-15 ENCOUNTER — Ambulatory Visit: Payer: Medicare HMO | Admitting: Nurse Practitioner

## 2020-07-15 ENCOUNTER — Encounter (HOSPITAL_COMMUNITY): Payer: Medicare HMO | Admitting: Cardiology

## 2020-07-15 ENCOUNTER — Institutional Professional Consult (permissible substitution): Payer: Medicare HMO | Admitting: Pulmonary Disease

## 2020-07-15 NOTE — Telephone Encounter (Signed)
[phone] (M) LMOM

## 2020-07-16 ENCOUNTER — Encounter: Payer: Self-pay | Admitting: Physician Assistant

## 2020-07-16 ENCOUNTER — Ambulatory Visit: Payer: Medicare HMO | Admitting: Physician Assistant

## 2020-07-16 ENCOUNTER — Telehealth: Payer: Self-pay | Admitting: Physician Assistant

## 2020-07-16 ENCOUNTER — Other Ambulatory Visit (INDEPENDENT_AMBULATORY_CARE_PROVIDER_SITE_OTHER): Payer: Medicare HMO

## 2020-07-16 VITALS — BP 122/60 | HR 72 | Ht 68.0 in | Wt 124.4 lb

## 2020-07-16 DIAGNOSIS — K254 Chronic or unspecified gastric ulcer with hemorrhage: Secondary | ICD-10-CM

## 2020-07-16 DIAGNOSIS — K259 Gastric ulcer, unspecified as acute or chronic, without hemorrhage or perforation: Secondary | ICD-10-CM

## 2020-07-16 DIAGNOSIS — R9389 Abnormal findings on diagnostic imaging of other specified body structures: Secondary | ICD-10-CM | POA: Diagnosis not present

## 2020-07-16 LAB — CBC WITH DIFFERENTIAL/PLATELET
Basophils Absolute: 0.1 10*3/uL (ref 0.0–0.1)
Basophils Relative: 0.6 % (ref 0.0–3.0)
Eosinophils Absolute: 0.3 10*3/uL (ref 0.0–0.7)
Eosinophils Relative: 2.9 % (ref 0.0–5.0)
HCT: 33.5 % — ABNORMAL LOW (ref 39.0–52.0)
Hemoglobin: 10.7 g/dL — ABNORMAL LOW (ref 13.0–17.0)
Lymphocytes Relative: 11 % — ABNORMAL LOW (ref 12.0–46.0)
Lymphs Abs: 1.3 10*3/uL (ref 0.7–4.0)
MCHC: 32 g/dL (ref 30.0–36.0)
MCV: 86.3 fl (ref 78.0–100.0)
Monocytes Absolute: 1.2 10*3/uL — ABNORMAL HIGH (ref 0.1–1.0)
Monocytes Relative: 9.8 % (ref 3.0–12.0)
Neutro Abs: 8.9 10*3/uL — ABNORMAL HIGH (ref 1.4–7.7)
Neutrophils Relative %: 75.7 % (ref 43.0–77.0)
Platelets: 288 10*3/uL (ref 150.0–400.0)
RBC: 3.88 Mil/uL — ABNORMAL LOW (ref 4.22–5.81)
RDW: 19.4 % — ABNORMAL HIGH (ref 11.5–15.5)
WBC: 11.8 10*3/uL — ABNORMAL HIGH (ref 4.0–10.5)

## 2020-07-16 LAB — IBC + FERRITIN
Ferritin: 239.5 ng/mL (ref 22.0–322.0)
Iron: 40 ug/dL — ABNORMAL LOW (ref 42–165)
Saturation Ratios: 18 % — ABNORMAL LOW (ref 20.0–50.0)
Transferrin: 159 mg/dL — ABNORMAL LOW (ref 212.0–360.0)

## 2020-07-16 NOTE — Progress Notes (Signed)
Chief Complaint: Follow-up after hospitalization for GI bleed  HPI:    [name] [name] is an 86 year old male with past medical history as listed below including CHF, ischemic cardiomyopathy (02/28/2018 echo with an LVEF 30-35%), CAD status post stenting, pulmonary hypertension, diverticulitis, recurrent midgut volvulus with surgical repair x4, CML (in remission) and CKD stage III, known to Dr. Havery Moros, who presents to clinic today for follow-up after recent hospitalization for GI bleed.      06/01/2020-06/04/2020 patient hospitalized for a GI bleed.  He was consulted by our service and it was noted that he was admitted for weakness, had recently been diagnosed with left-sided pneumonia.  Apparently had some vomiting.  Noncontrast CT showed small bowel with several proximal loops of fluid-filled marginally dilated loops without discrete point of transition, described having dark stools.  His chronic diarrhea was discussed which he generally responded to antibiotics for SIBO.    06/03/2020 patient underwent EGD with Dr. Hilarie Fredrickson with esophageal mucosal changes consistent with long segment Barrett's esophagus, 2 cm hiatal hernia, acute gastritis with 2 small gastric ulcers and scattered erosions, nonbleeding duodenal ulcers and duodenitis with no stigmata of bleeding as well as normal second portion of duodenum.  Patient was started on twice daily PPI x8 weeks and then daily thereafter.  It was recommended patient have outpatient MRCP for further evaluation of biliary dilation seen on CT.    06/04/2020 our service signed off in the hospital.  Biopsies from the procedure were notable for nondysplastic Barrett's esophagus and non-H. pylori gastritis.  He was continue to improve the hemoglobin overall stable and no overt GI blood loss.  Continued on Protonix 40 twice daily x8 weeks, then daily with plans for outpatient MRI/MRCP.    06/25/2020 CBC with a hemoglobin of 9.3.  (7.5 on 06/24/2020).    07/02/2020 patient seen by  Dr. Aundra Dubin with advanced heart failure clinic.      Today, the patient presents to clinic accompanied by his daughter who does assist with history, and tells me he has no problems at all.  He is currently taking his Pantoprazole 40 mg twice daily and has no indigestion.  Previously this is a problem for him.  Denies seeing any further black stools and has no complaints.    Denies fever, chills, weight loss, change in bowel habits, blood in his stool or symptoms that awaken him from sleep.  Past Medical History:  Diagnosis Date  . Barrett's esophagus   . Carotid artery occlusion   . Chronic systolic CHF (congestive heart failure) ([place])    a. 07/2013 Echo: EF 35-40%  . CKD (chronic kidney disease), stage III    pt. states that he does not have CKD  . CML (chronic myelocytic leukemia) ([place])   . Complication of anesthesia    Pt states he has a hard time waking up feels like he cant breathe and esophagus is closing up   . Coronary artery disease    a. 09/2013 Cath: LM min irregs, LAD 95p(2.75x20 Promus Premier DES & 3.0x20 Promus Premier DES overlapping), D1 50-60, LCX 30p, OM1 25m, OM2 60-70p, RCA small, nl.  . Diverticulitis   . Dysphonia 01/08/2017  . History of hiatal hernia   . History of kidney stones   . History of small bowel obstruction   . Hyperlipidemia    denies  . Ischemic cardiomyopathy    a. 07/2013 Echo: EF 35-40%, apical septal, apical lat, apical AK, mod MR.  . Small bowel perforation ([place])  a. 11/2012 s/p emergent SB resection.  . 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 BIOPSY  06/03/2020   Procedure: BIOPSY;  Surgeon: Jerene Bears, MD;  Location: [facility];  Service: Endoscopy;;  . BOWEL RESECTION     3cm sm intestine  . CARDIAC CATHETERIZATION  10/09/2013  . CAROTID ENDARTERECTOMY    . CHOLECYSTECTOMY    . COLON SURGERY  11/2012   BOWEL SURGERY   . CORONARY ANGIOPLASTY  10/09/2013   MID LAD  . ENDARTERECTOMY  Right 09/17/2015   Procedure: Right Carotid ENDARTERECTOMY with Xenosure Patch Angioplasty;  Surgeon: Serafina Mitchell, MD;  Location: [facility] OR;  Service: Vascular;  Laterality: Right;  . ESOPHAGOGASTRODUODENOSCOPY (EGD) WITH PROPOFOL N/A 06/03/2020   Procedure: ESOPHAGOGASTRODUODENOSCOPY (EGD) WITH PROPOFOL;  Surgeon: Jerene Bears, MD;  Location: [facility];  Service: Endoscopy;  Laterality: N/A;  . EYE SURGERY Bilateral   . INGUINAL HERNIA REPAIR Bilateral   . LAPAROSCOPIC CHOLECYSTECTOMY    . LAPAROTOMY N/A 10/14/2017   Procedure: EXPLORATORY LAPAROTOMY for SMALL BOWEL OBSTRUCTION, cecopexy;  Surgeon: Excell Seltzer, MD;  Location: [facility] ORS;  Service: General;  Laterality: N/A;  . LAPAROTOMY N/A 11/09/2017   Procedure: EXPLORATORY LAPAROTOMY reduction small bowel valvulous;  Surgeon: Rolm Bookbinder, MD;  Location: [facility] ORS;  Service: General;  Laterality: N/A;  . LAPAROTOMY N/A 01/10/2018   Procedure: exploratory laparotomy, lysis of adhesions, decompression of midgut volvulus;  Surgeon: Jovita Kussmaul, MD;  Location: [facility];  Service: General;  Laterality: N/A;  . LAPAROTOMY N/A 08/21/2018   Procedure: EXPLORATORY LAPAROTOMY;  Surgeon: Excell Seltzer, MD;  Location: [facility] OR[facility];  Service: General;  Laterality: N/A;  . LEFT HEART CATHETERIZATION WITH CORONARY ANGIOGRAM N/A 10/09/2013   Procedure: LEFT HEART CATHETERIZATION WITH CORONARY ANGIOGRAM;  Surgeon: Larey Dresser, MD;  Location: [facility] CATH LAB;  Service: Cardiovascular;  Laterality: N/A;  . PERCUTANEOUS CORONARY STENT INTERVENTION (PCI-S)  10/09/2013   Procedure: PERCUTANEOUS CORONARY STENT INTERVENTION (PCI-S);  Surgeon: Larey Dresser, MD;  Location: [facility] CATH LAB;  Service: Cardiovascular;;    Current Outpatient Medications  Medication Sig Dispense Refill  . acetaminophen (TYLENOL) 325 MG tablet Take 650 mg by mouth every 6 (six) hours as needed for mild pain or headache.    . Ascorbic Acid (VITAMIN C) 500 MG CAPS Take 500 mg by  mouth daily.     Marland Kitchen aspirin EC 81 MG tablet Take 1 tablet (81 mg total) by mouth daily. 90 tablet 3  . BOSULIF 400 MG tablet Take 400 mg by mouth daily with breakfast.    . carvedilol (COREG) 6.25 MG tablet Take 1 tablet (6.25 mg total) by mouth 2 (two) times daily. 60 tablet 3  . cyanocobalamin 1000 MCG tablet Take 1,000 mcg by mouth daily.    . diclofenac Sodium (VOLTAREN) 1 % GEL Apply 2 g topically 4 (four) times daily as needed (pain).    Marland Kitchen diphenhydramine-acetaminophen (TYLENOL PM) 25-500 MG TABS tablet Take 2 tablets by mouth at bedtime as needed (sleep).     . ezetimibe (ZETIA) 10 MG tablet Take 1 tablet (10 mg total) by mouth daily. 90 tablet 3  . Ferrous Sulfate (IRON) 325 (65 Fe) MG TABS Take 325 mg by mouth daily.     . furosemide (LASIX) 20 MG tablet Take 1 tablet (20 mg total) by mouth daily as needed. 30 tablet 3  . guaiFENesin-dextromethorphan (ROBITUSSIN DM) 100-10 MG/5ML syrup Take 5 mLs by mouth  every 4 (four) hours as needed for cough. 118 mL 0  . hydrocortisone cream 1 % Apply 1 application topically 2 (two) times daily as needed for itching.    . loperamide (IMODIUM) 2 MG capsule Take 2 mg by mouth as needed for diarrhea or loose stools.     . pantoprazole (PROTONIX) 40 MG tablet Take 1 tablet (40 mg total) by mouth 2 (two) times daily before a meal. 60 tablet 0  . Simethicone (GAS-X PO) Take 1 tablet by mouth daily as needed (gas).      No current facility-administered medications for this visit.    Allergies as of 07/16/2020 - Review Complete 07/02/2020  Allergen Reaction Noted  . Amoxicillin Nausea Only 06/22/2020  . Crestor [rosuvastatin] Itching 06/01/2020  . Percocet [oxycodone-acetaminophen] Itching 09/17/2015  . Spironolactone Other (See Comments) 06/01/2020  . Metronidazole Other (See Comments) 09/16/2015    Family History  Problem Relation Age of Onset  . Emphysema Father        died @ 57- smoked  . Heart disease Father   . Kidney Stones Father   .  Diabetes Mother        died @ 75  . Stroke Mother   . Head & neck cancer Brother   . Lymphoma Brother   . Colon cancer Neg Hx   . Stomach cancer Neg Hx   . Rectal cancer Neg Hx   . Esophageal cancer Neg Hx   . Liver cancer Neg Hx     Social History   Socioeconomic History  . Marital status: Married    Spouse name: Not on file  . Number of children: 4  . Years of education: Not on file  . Highest education level: Not on file  Occupational History  . Occupation: retired  Tobacco Use  . Smoking status: Former Smoker    Packs/day: 1.00    Years: 45.00    Pack years: 45.00    Types: Cigarettes    Quit date: 06/09/1999    Years since quitting: 21.1  . Smokeless tobacco: Never Used  Vaping Use  . Vaping Use: Never used  Substance and Sexual Activity  . Alcohol use: No    Alcohol/week: 0.0 standard drinks  . Drug use: No  . Sexual activity: Not on file  Other Topics Concern  . Not on file  Social History Narrative   Pt lives in [place] with his wife of 14 yrs (second wife).  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  Very active @ home.  Still push mows his yard.   Social Determinants of Health   Financial Resource Strain:   . Difficulty of Paying Living Expenses: Not on file  Food Insecurity:   . Worried About Charity fundraiser in the Last Year: Not on file  . Ran Out of Food in the Last Year: Not on file  Transportation Needs:   . Lack of Transportation (Medical): Not on file  . Lack of Transportation (Non-Medical): Not on file  Physical Activity:   . Days of Exercise per Week: Not on file  . Minutes of Exercise per Session: Not on file  Stress:   . Feeling of Stress : Not on file  Social Connections:   . Frequency of Communication with Friends and Family: Not on file  . Frequency of Social Gatherings with Friends and Family: Not on file  . Attends Religious Services: Not on file  . Active Member  of Clubs or Organizations:  Not on file  . Attends Archivist Meetings: Not on file  . Marital Status: Not on file  Intimate Partner Violence:   . Fear of Current or Ex-Partner: Not on file  . Emotionally Abused: Not on file  . Physically Abused: Not on file  . Sexually Abused: Not on file    Review of Systems:    Constitutional: No weight loss, fever or chills Cardiovascular: No chest pain Respiratory: No SOB Gastrointestinal: See HPI and otherwise negative   Physical Exam:  Vital signs: BP 122/60 (BP Location: Left Arm, Patient Position: Sitting, Cuff Size: Normal)   Pulse 72   Ht 5\' 8"  (1.727 m) Comment: height measured without shoes  Wt 124 lb 6 oz (56.4 kg)   BMI 18.91 kg/m   Constitutional:   Pleasant Elderly Caucasian male appears to be in NAD, Well developed, Well nourished, alert and cooperative Respiratory: Respirations even and unlabored. Lungs clear to auscultation bilaterally.   No wheezes, crackles, or rhonchi.  Cardiovascular: Normal S1, S2. No MRG. Regular rate and rhythm. No peripheral edema, cyanosis or pallor.  Gastrointestinal:  Soft, nondistended, nontender. No rebound or guarding. Normal bowel sounds. No appreciable masses or hepatomegaly. Rectal:  Not performed.  Psychiatric: Demonstrates good judgement and reason without abnormal affect or behaviors.  RELEVANT LABS AND IMAGING: CBC    Component Value Date/Time   WBC 12.7 (H) 06/25/2020 0336   RBC 3.34 (L) 06/25/2020 0336   HGB 9.3 (L) 06/25/2020 0336   HCT 30.6 (L) 06/25/2020 0336   HCT 27.2 (L) 06/24/2020 0823   PLT 217 06/25/2020 0336   MCV 91.6 06/25/2020 0336   MCH 27.8 06/25/2020 0336   MCHC 30.4 06/25/2020 0336   RDW 16.1 (H) 06/25/2020 0336   LYMPHSABS 1.2 06/22/2020 0850   MONOABS 2.1 (H) 06/22/2020 0850   EOSABS 0.3 06/22/2020 0850   BASOSABS 0.0 06/22/2020 0850    CMP     Component Value Date/Time   NA 139 07/02/2020 1006   K 4.4 07/02/2020 1006   CL 109 07/02/2020 1006   CO2 20 (L) 07/02/2020  1006   GLUCOSE 99 07/02/2020 1006   BUN 14 07/02/2020 1006   CREATININE 1.41 (H) 07/02/2020 1006   CREATININE 1.65 (H) 02/28/2016 0958   CALCIUM 7.9 (L) 07/02/2020 1006   PROT 7.5 06/22/2020 0850   ALBUMIN 3.0 (L) 06/22/2020 0850   AST 13 (L) 06/22/2020 0850   ALT 14 06/22/2020 0850   ALKPHOS 107 06/22/2020 0850   BILITOT 0.7 06/22/2020 0850   GFRNONAA 44 (L) 07/02/2020 1006   GFRAA 52 (L) 07/02/2020 1006    Assessment: 1.  Recent GI bleed with gastric ulcers: See HPI for recent hospitalization details, EGD with gastric ulcers, no further complaints at this time 2.  Abnormal CT: With dilated bile ducts in the hospital, repeat imaging with MRI/MRCP was recommended as an outpatient  Plan: 1.  Ordered CBC and iron studies today 2.  Will discuss with Dr. Havery Moros if patient needs repeat EGD for surveillance of gastric ulcers noted during hospitalization.  If he does need this procedure would need to take place in the hospital given his decreased EF.  Patient does tell me today that "I do not want have anything done that I do not need to". 3.  Schedule patient for an MRI/MRCP given abnormal ducts on recent CT in the hospital from 05/31/2020. 4.  Patient was told to continue Pantoprazole 40 mg twice daily for  a total of 8 weeks, then decrease to once daily dosing, in the morning 30 to 60 minutes for breakfast.  He should stay on this indefinitely. 5.  Daughter asked about using Pepcid if he has any breakthrough indigestion.  This is safe to do up to twice a day. 6.  Patient to follow in clinic for recommendations after imaging and labs above.  Ellouise Newer, PA-C [facility] 07/16/2020, 11:30 AM  Cc: Drake Leach, MD

## 2020-07-16 NOTE — Telephone Encounter (Signed)
See lab result note from today.

## 2020-07-16 NOTE — Progress Notes (Signed)
Agree with assessment and plan as outlined. He is higher than average risk for repeat endoscopy, if he does not wish to have any further surveillance given his age and comorbidities that may be reasonable. I would like to see him back in the office in 3-4 months for reassessment, otherwise await results of MRCP in the interim, continue PPI. Thanks

## 2020-07-16 NOTE — Telephone Encounter (Signed)
Pts daughter calling stating someone called her father and he could not hear them as he is HOH. States she "thought we knew to call her as he is HOH." I explained I did not see a note from a call but would send this to [place] and [place] B[place]berg to see if they perhaps called. Please call daughter.

## 2020-07-16 NOTE — Telephone Encounter (Signed)
Pt's daughter is requesting a call back from a nurse to discuss an EGD procedure.

## 2020-07-16 NOTE — Patient Instructions (Signed)
Your provider has requested that you go to the basement level for lab work before leaving today. Press "B" on the elevator. The lab is located at the first door on the left as you exit the elevator.  Remain on pantoprazole 40 mg twice a day for a total of 8 weeks, then decrease to once daily dosing 30-60 minutes before breakfast.   You have been scheduled for an MRI/MRCP at [facility] on 09/27/20. Your appointment time is 1:00pm. Please arrive 30 minutes prior to your appointment time for registration purposes. Please make certain not to have anything to eat or drink 4 hours prior to your test. In addition, if you have any metal in your body, have a pacemaker or defibrillator, please be sure to let your ordering physician know. This test typically takes 45 minutes to 1 hour to complete. Should you need to reschedule, please call [phone] to do so.

## 2020-07-21 ENCOUNTER — Other Ambulatory Visit (HOSPITAL_COMMUNITY): Payer: Medicare HMO

## 2020-07-24 ENCOUNTER — Other Ambulatory Visit: Payer: Self-pay

## 2020-07-24 ENCOUNTER — Emergency Department (HOSPITAL_BASED_OUTPATIENT_CLINIC_OR_DEPARTMENT_OTHER)
Admission: EM | Admit: 2020-07-24 | Discharge: 2020-07-24 | Disposition: A | Discharge status: Home / Self Care | Payer: Medicare HMO | Attending: Emergency Medicine | Admitting: Emergency Medicine

## 2020-07-24 ENCOUNTER — Encounter (HOSPITAL_BASED_OUTPATIENT_CLINIC_OR_DEPARTMENT_OTHER): Payer: Self-pay

## 2020-07-24 DIAGNOSIS — R109 Unspecified abdominal pain: Secondary | ICD-10-CM | POA: Diagnosis not present

## 2020-07-24 DIAGNOSIS — R110 Nausea: Secondary | ICD-10-CM | POA: Diagnosis not present

## 2020-07-24 DIAGNOSIS — Z5321 Procedure and treatment not carried out due to patient leaving prior to being seen by health care provider: Secondary | ICD-10-CM | POA: Diagnosis not present

## 2020-07-24 LAB — COMPREHENSIVE METABOLIC PANEL
ALT: 11 U/L (ref 0–44)
AST: 14 U/L — ABNORMAL LOW (ref 15–41)
Albumin: 2.8 g/dL — ABNORMAL LOW (ref 3.5–5.0)
Alkaline Phosphatase: 85 U/L (ref 38–126)
Anion gap: 9 (ref 5–15)
BUN: 13 mg/dL (ref 8–23)
CO2: 19 mmol/L — ABNORMAL LOW (ref 22–32)
Calcium: 7.6 mg/dL — ABNORMAL LOW (ref 8.9–10.3)
Chloride: 106 mmol/L (ref 98–111)
Creatinine, Ser: 1.26 mg/dL — ABNORMAL HIGH (ref 0.61–1.24)
GFR calc Af Amer: 59 mL/min — ABNORMAL LOW (ref >60)
GFR calc non Af Amer: 51 mL/min — ABNORMAL LOW (ref >60)
Glucose, Bld: 105 mg/dL — ABNORMAL HIGH (ref 70–99)
Potassium: 4 mmol/L (ref 3.5–5.1)
Sodium: 134 mmol/L — ABNORMAL LOW (ref 135–145)
Total Bilirubin: 0.5 mg/dL (ref 0.3–1.2)
Total Protein: 6.7 g/dL (ref 6.5–8.1)

## 2020-07-24 LAB — CBC
HCT: 36.7 % — ABNORMAL LOW (ref 39.0–52.0)
Hemoglobin: 11.2 g/dL — ABNORMAL LOW (ref 13.0–17.0)
MCH: 27.4 pg (ref 26.0–34.0)
MCHC: 30.5 g/dL (ref 30.0–36.0)
MCV: 89.7 fL (ref 80.0–100.0)
Platelets: 277 10*3/uL (ref 150–400)
RBC: 4.09 MIL/uL — ABNORMAL LOW (ref 4.22–5.81)
RDW: 18.6 % — ABNORMAL HIGH (ref 11.5–15.5)
WBC: 12.3 10*3/uL — ABNORMAL HIGH (ref 4.0–10.5)
nRBC: 0 % (ref 0.0–0.2)

## 2020-07-24 LAB — LIPASE, BLOOD: Lipase: 16 U/L (ref 11–51)

## 2020-07-24 NOTE — ED Triage Notes (Signed)
Pt arrives with son who reports pt has had bad stomach pains that come and go over the past few hours with some nausea over the last few days. Pt reports history of bowel blockages.

## 2020-07-27 ENCOUNTER — Other Ambulatory Visit: Payer: Self-pay

## 2020-07-27 ENCOUNTER — Ambulatory Visit (HOSPITAL_COMMUNITY)
Admission: RE | Admit: 2020-07-27 | Discharge: 2020-07-27 | Disposition: A | Discharge status: Home / Self Care | Payer: Medicare HMO | Source: Ambulatory Visit | Attending: Cardiology | Admitting: Cardiology

## 2020-07-27 DIAGNOSIS — I340 Nonrheumatic mitral (valve) insufficiency: Secondary | ICD-10-CM | POA: Diagnosis not present

## 2020-07-27 DIAGNOSIS — I351 Nonrheumatic aortic (valve) insufficiency: Secondary | ICD-10-CM | POA: Insufficient documentation

## 2020-07-27 DIAGNOSIS — I5022 Chronic systolic (congestive) heart failure: Secondary | ICD-10-CM

## 2020-07-27 DIAGNOSIS — I2510 Atherosclerotic heart disease of native coronary artery without angina pectoris: Secondary | ICD-10-CM | POA: Diagnosis not present

## 2020-07-27 DIAGNOSIS — I429 Cardiomyopathy, unspecified: Secondary | ICD-10-CM | POA: Insufficient documentation

## 2020-07-27 LAB — ECHOCARDIOGRAM COMPLETE
Area-P 1/2: 4.39 cm2
Calc EF: 33 %
MV M vel: 5.43 m/s
MV Peak grad: 117.9 mmHg
P 1/2 time: 405 msec
Radius: 0.4 cm
S' Lateral: 4 cm
Single Plane A2C EF: 35.3 %
Single Plane A4C EF: 21.3 %

## 2020-07-27 NOTE — Progress Notes (Signed)
  Echocardiogram 2D Echocardiogram has been performed.  [name] [name] 07/27/2020, 3:21 PM

## 2020-07-28 ENCOUNTER — Other Ambulatory Visit: Payer: Self-pay

## 2020-07-28 ENCOUNTER — Telehealth: Payer: Self-pay | Admitting: Gastroenterology

## 2020-07-28 ENCOUNTER — Ambulatory Visit (HOSPITAL_COMMUNITY)
Admission: RE | Admit: 2020-07-28 | Discharge: 2020-07-28 | Disposition: A | Discharge status: Home / Self Care | Payer: Medicare HMO | Source: Ambulatory Visit | Attending: Cardiovascular Disease | Admitting: Cardiovascular Disease

## 2020-07-28 DIAGNOSIS — I6523 Occlusion and stenosis of bilateral carotid arteries: Secondary | ICD-10-CM

## 2020-07-28 MED ORDER — PANTOPRAZOLE SODIUM 40 MG PO TBEC: 40.0000 mg | DELAYED_RELEASE_TABLET | Freq: Two times a day (BID) | ORAL | 1 refills | Status: DC

## 2020-07-28 NOTE — Progress Notes (Signed)
Script sent to [facility] pharmacy per request

## 2020-07-28 NOTE — Telephone Encounter (Signed)
Patient seen on 8-27 by  Darrell Jewel.  Refill Protonix 40mg  bid

## 2020-07-29 ENCOUNTER — Encounter (HOSPITAL_COMMUNITY): Payer: Self-pay

## 2020-07-29 ENCOUNTER — Ambulatory Visit (HOSPITAL_COMMUNITY)
Admission: RE | Admit: 2020-07-29 | Payer: Medicare HMO | Source: Ambulatory Visit | Attending: Cardiology | Admitting: Cardiology

## 2020-07-30 ENCOUNTER — Telehealth (HOSPITAL_COMMUNITY): Payer: Self-pay

## 2020-07-30 ENCOUNTER — Encounter (HOSPITAL_COMMUNITY): Payer: Self-pay

## 2020-07-30 NOTE — Telephone Encounter (Signed)
-----   Message from Larey Dresser, MD sent at 07/28/2020  8:38 PM EDT ----- Mild bilateral stenosis

## 2020-07-30 NOTE — Telephone Encounter (Signed)
Malena Edman, RN  07/30/2020 4:22 PM EDT Back to Top    Advised Patient and daughter. They were agreeable and verbalized understanding   [place], CMA  07/29/2020 10:00 AM EDT     lmtrc

## 2020-08-02 ENCOUNTER — Encounter (HOSPITAL_COMMUNITY): Payer: Self-pay

## 2020-08-03 ENCOUNTER — Other Ambulatory Visit: Payer: Self-pay

## 2020-08-03 ENCOUNTER — Ambulatory Visit (HOSPITAL_COMMUNITY)
Admission: RE | Admit: 2020-08-03 | Discharge: 2020-08-03 | Disposition: A | Discharge status: Home / Self Care | Payer: Medicare HMO | Source: Ambulatory Visit | Attending: Cardiology | Admitting: Cardiology

## 2020-08-03 ENCOUNTER — Other Ambulatory Visit (HOSPITAL_COMMUNITY): Payer: Self-pay | Admitting: *Deleted

## 2020-08-03 VITALS — BP 125/75 | HR 81 | Wt 117.6 lb

## 2020-08-03 DIAGNOSIS — G4733 Obstructive sleep apnea (adult) (pediatric): Secondary | ICD-10-CM | POA: Diagnosis not present

## 2020-08-03 DIAGNOSIS — E785 Hyperlipidemia, unspecified: Secondary | ICD-10-CM | POA: Diagnosis not present

## 2020-08-03 DIAGNOSIS — N183 Chronic kidney disease, stage 3 unspecified: Secondary | ICD-10-CM | POA: Diagnosis not present

## 2020-08-03 DIAGNOSIS — I340 Nonrheumatic mitral (valve) insufficiency: Secondary | ICD-10-CM

## 2020-08-03 DIAGNOSIS — Z8719 Personal history of other diseases of the digestive system: Secondary | ICD-10-CM | POA: Diagnosis not present

## 2020-08-03 DIAGNOSIS — Z87891 Personal history of nicotine dependence: Secondary | ICD-10-CM | POA: Diagnosis not present

## 2020-08-03 DIAGNOSIS — Z79899 Other long term (current) drug therapy: Secondary | ICD-10-CM | POA: Insufficient documentation

## 2020-08-03 DIAGNOSIS — J439 Emphysema, unspecified: Secondary | ICD-10-CM | POA: Insufficient documentation

## 2020-08-03 DIAGNOSIS — Z955 Presence of coronary angioplasty implant and graft: Secondary | ICD-10-CM | POA: Diagnosis not present

## 2020-08-03 DIAGNOSIS — Z9049 Acquired absence of other specified parts of digestive tract: Secondary | ICD-10-CM | POA: Insufficient documentation

## 2020-08-03 DIAGNOSIS — I6529 Occlusion and stenosis of unspecified carotid artery: Secondary | ICD-10-CM | POA: Insufficient documentation

## 2020-08-03 DIAGNOSIS — I255 Ischemic cardiomyopathy: Secondary | ICD-10-CM | POA: Insufficient documentation

## 2020-08-03 DIAGNOSIS — Z836 Family history of other diseases of the respiratory system: Secondary | ICD-10-CM | POA: Diagnosis not present

## 2020-08-03 DIAGNOSIS — I5022 Chronic systolic (congestive) heart failure: Secondary | ICD-10-CM | POA: Insufficient documentation

## 2020-08-03 DIAGNOSIS — Z7982 Long term (current) use of aspirin: Secondary | ICD-10-CM | POA: Diagnosis not present

## 2020-08-03 DIAGNOSIS — I2510 Atherosclerotic heart disease of native coronary artery without angina pectoris: Secondary | ICD-10-CM | POA: Diagnosis not present

## 2020-08-03 MED ORDER — FUROSEMIDE 20 MG PO TABS
20.0000 mg | ORAL_TABLET | Freq: Every day | ORAL | 3 refills | Status: DC
Start: 2020-08-03 — End: 2020-08-24

## 2020-08-03 MED ORDER — POTASSIUM CHLORIDE ER 10 MEQ PO TBCR
10.0000 meq | EXTENDED_RELEASE_TABLET | Freq: Every day | ORAL | 3 refills | Status: DC
Start: 2020-08-03 — End: 2020-09-03

## 2020-08-03 NOTE — Patient Instructions (Signed)
Start Furosemide 20 mg Daily  Start Potassium 10 meq Daily  Your physician has requested that you have a TEE. During a TEE, sound waves are used to create images of your heart. It provides your doctor with information about the size and shape of your heart and how well your hearts chambers and valves are working. In this test, a transducer is attached to the end of a flexible tube thats guided down your throat and into your esophagus (the tube leading from you mouth to your stomach) to get a more detailed image of your heart. You are not awake for the procedure. Please see the instruction sheet given to you today. For further information please visit [email or web address].  Your physician recommends that you schedule a follow-up appointment in: 1 month  If you have any questions or concerns before your next appointment please send [place] a message through [place] or call our office at [phone].    TO LEAVE A MESSAGE FOR THE NURSE SELECT OPTION 2, PLEASE LEAVE A MESSAGE INCLUDING:  YOUR NAME  DATE OF BIRTH  CALL BACK NUMBER  REASON FOR CALL**this is important as we prioritize the call backs  YOU WILL RECEIVE A CALL BACK THE SAME DAY AS LONG AS YOU CALL BEFORE 4:00 PM  At the [facility], you and your health needs are our priority. As part of our continuing mission to provide you with exceptional heart care, we have created designated Provider Care Teams. These Care Teams include your primary Cardiologist (physician) and Advanced Practice Providers (APPs- Physician Assistants and Nurse Practitioners) who all work together to provide you with the care you need, when you need it.   You may see any of the following providers on your designated Care Team at your next follow up:  Dr Glori Bickers  Dr Haynes Kerns, NP  Lyda Jester, [place]  Audry Riles, PharmD   Please be sure to bring in all your medications bottles to every appointment.     You are  scheduled for a TEE on Wed 08/11/20 with Dr. Aundra Dubin.  Please arrive at the [facility] (Main Entrance A) at [facility]: [address] [place], [place] [address] at 10:00 am   DIET: Nothing to eat or drink after midnight except a sip of water with medications (see medication instructions below)  Medication Instructions:  DO NOT TAKE FUROSEMIDE WED 9/22 AM  COVID TEST: ON Monday 08/09/20 AT 10:45 am, PLEASE GO TO:   [facility] must have a responsible person to drive you home and stay in the waiting area during your procedure. Failure to do so could result in cancellation.  Bring your insurance cards.  *Special Note: Every effort is made to have your procedure done on time. Occasionally there are emergencies that occur at the hospital that may cause delays. Please be patient if a delay does occur.

## 2020-08-04 ENCOUNTER — Encounter (HOSPITAL_COMMUNITY): Payer: Self-pay

## 2020-08-04 NOTE — Progress Notes (Signed)
Advanced Heart Failure Clinic Note   PCP: Dr. Sallyanne Havers [facility]) Cardiology: Dr. Aundra Dubin  86 y.o. male with history of recurrent small bowel obstruction and CAD with ischemic cardiomyopathy presents for followup.  In 9/14, patient was hospitalized with recurrent abdominal pain from small bowel obstruction.  This was managed conservatively with NG tube/NPO and IV fluid.  In the hospital, the patient developed pulmonary edema and actually ended up being intubated.  Troponin was normal. Echo was done showing EF 35-40% with anteroseptal, anterolateral, and apical akinesis along with moderate AI.  He had no prior known cardiac disease.  It was thought that the patient had had an out of hospital MI at some point then developed pulmonary edema with IVF while NPO.  He was diuresed and extubated.  Creatinine was up to about 1.6 in the hospital so no cardiac cath was done (not thought to be urgent given normal creatinine).   I took him for [facility] in 11/14.  This showed 95% mid LAD stenosis that was treated with 2 overlapping Promus DES.  Echo in 2/15 showed EF 30-35% with normal RV.  Coreg was cut back to 6.25 bid due to bradycardia and fatigue.   Had R CEA in 11/16 with Dr Trula Slade. He developed gynecomastia and had to stop spironolactone.  Eplerenone caused nausea/vomiting.    He was admitted in 12/18 with SBO, had ex lap with lysis of adhesions.  He was admitted again in 2/19 with SBO, again with ex lap and lysis of adhesions.    Lasix was stopped due to orthostatic symptoms, and he was admitted in 4/19 with dyspnea, found to have CHF.  He got IV Lasix and was restarted on po Lasix for home.   Echo in 4/19 with EF 30-35%, moderate LVH, mild to moderate AI, moderate MR.    In 5/21, he was admitted with recurrent SBO. In 7/21, he was admitted with PNA and GI bleeding from PUD.  In 8/21, he was admitted with AKI in setting of poor po intake (creatinine up to 3.25).    He continues to follow with oncology  for treatment of CML.   Echo in 9/21 showed EF 30-35%, mild LVH, normal RV, at least moderate and possibly severe mitral regurgitation, moderate AI. CT chest in 9/21 showed emphysema and a LUL consolidation with air-fluid levels in a bulla consistent with small abscess.   He returns today for HF follow up. He is on doxycycline for possible lung abscess, no fever.  Over the summer, he has been more short of breath.  He is dyspneic with moderate exertion likely walking up stairs.  He is walking with a walker.  He is doing less than he did in the past. He occasionally uses Lasix if he has ankle edema.  Weight stable.    Labs (9/14): K 3.4, creatinine 1.55 => 1.4, LDL 89, HDL 38 Labs (11/14): K 4.9, creatinine 1.6 Labs (1/15): K 4.1, creatinine 1.4 Labs (2/15): LDL 34, HDL 32 Labs (2/16): K 4.5, creatinine 1.5 Labs (8/16): LDL 26, HDL 33 Labs (11/16): K 3.4, creatinine 1.49 Labs (4/17): K 4, creatinine 1.65 Labs (4/18): K 4.4, creatinine 1.4 Labs (5/18): K 4.3, creatinine 1.68, LDL 21, HDL 23 Labs (4/19): K 4.2, creatinine 1.4, LDL 72 Labs (4/21): LDL 52 Labs (8/21): K 5.5, creatinine 1.6, hgb 9.3 Labs (9/21): K 4, creatinine 1.26  PMH: 1. Nephrolithiasis 2. H/o CCY 3. H/o diverticulitis 4. H/o SBO: initially in 1/14 with small bowel resection,  again in 9/14 treated conservatively.  Ex laps for lysis of adhesions in 12/18 and 2/19.  5. Ischemic cardiomyopathy: Echo (9/14) with EF 35-40%, anteroseptal/anterolateral/apical akinesis and moderate AI.  Patient had acute pulmonary edema while hospitalized in 9/14. Echo (2/15) with EF 30-35% with wall motion abnormalities, normal RV size and systolic function, mild MR and mild AI.  - Echo (4/19): EF 30-35% with regional wall motion abnormalities, moderate LVH, mild-moderate AI, moderate MR, PASP 59 mmHg.  - Echo (9/21): EF 30-35%, mild LVH, normal RV, at least moderate and possibly severe mitral regurgitation, moderate AI.  6. CKD stage 3 7.  Aortic insufficiency: mild on last echo 8. CAD: LHC (11/14) with 95% mLAD stenosis treated with 2 overlapping Promus DES.  There was residual 50% stenosis beyond the stent margin.   9. Carotid stenosis: 2/16 carotid dopplers with 80-99% RICA stenosis, 29-79% LICA stenosis. CTA neck showed 90% RICA stenosis. Right CEA 10/16.  - Carotid dopplers (9/21): mild bilateral ICA stenosis.  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 COPD: Emphysema on chest CT.  12. Hyperlipidemia: Myalgias with atorvastatin and Crestor.  13. OSA: Does not want CPAP.   SH: widower (2nd time) with 4 children.  Lives in [place].  Previous heavy smoker.  Retired Chief Financial Officer.   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   Review of systems complete and found to be negative unless listed in HPI.     Current Outpatient Medications  Medication Sig Dispense Refill  . acetaminophen (TYLENOL) 325 MG tablet Take 650 mg by mouth every 6 (six) hours as needed for mild pain or headache.    . Ascorbic Acid (VITAMIN C) 500 MG CAPS Take 500 mg by mouth daily.     Marland Kitchen aspirin EC 81 MG tablet Take 1 tablet (81 mg total) by mouth daily. 90 tablet 3  . BOSULIF 400 MG tablet Take 400 mg by mouth daily with breakfast.    . carvedilol (COREG) 6.25 MG tablet Take 1 tablet (6.25 mg total) by mouth 2 (two) times daily. 60 tablet 3  . cyanocobalamin 1000 MCG tablet Take 1,000 mcg by mouth daily.    . diclofenac Sodium (VOLTAREN) 1 % GEL Apply 2 g topically 4 (four) times daily as needed (pain).    Marland Kitchen diphenhydramine-acetaminophen (TYLENOL PM) 25-500 MG TABS tablet Take 2 tablets by mouth at bedtime as needed (sleep).     . ezetimibe (ZETIA) 10 MG tablet Take 1 tablet (10 mg total) by mouth daily. 90 tablet 3  . famotidine (PEPCID) 20 MG tablet Take 20 mg by mouth as needed for heartburn or indigestion.    . Ferrous Sulfate (IRON) 325 (65 Fe) MG TABS Take 325 mg by mouth daily.     . hydrocortisone cream 1 % Apply 1 application topically 2 (two) times  daily as needed for itching.    . loperamide (IMODIUM) 2 MG capsule Take 2 mg by mouth as needed for diarrhea or loose stools.     . pantoprazole (PROTONIX) 40 MG tablet Take 1 tablet (40 mg total) by mouth 2 (two) times daily before a meal. 180 tablet 1  . furosemide (LASIX) 20 MG tablet Take 1 tablet (20 mg total) by mouth daily. 30 tablet 3  . potassium chloride (KLOR-CON) 10 MEQ tablet Take 1 tablet (10 mEq total) by mouth daily. 30 tablet 3   No current facility-administered medications for this encounter.    BP 125/75   Pulse 81   Wt 53.3 kg (117 lb  9.6 oz)   SpO2 95%   BMI 17.37 kg/m  General: NAD Neck: JVP 8 cm, no thyromegaly or thyroid nodule.  Lungs: Clear to auscultation bilaterally with normal respiratory effort. CV: Nondisplaced PMI.  Heart regular S1/S2, no S3/S4, 2/6 HSM apex.  Trace ankle edema.  No carotid bruit.  Normal pedal pulses.  Abdomen: Soft, nontender, no hepatosplenomegaly, no distention.  Skin: Intact without lesions or rashes.  Neurologic: Alert and oriented x 3.  Psych: Normal affect. Extremities: No clubbing or cyanosis.  HEENT: Normal.   Assessment/Plan: 1. Chronic systolic CHF: Ischemic cardiomyopathy, EF 30-35% on 9/21 echo. Unable to take spironolactone (gynecomastia) or eplerenone (nausea/vomiting).  He has been off losartan as well as Lasix since 8/21 admission with AKI.  NYHA class II-III symptoms, somewhat progressive.  Possible mild volume overload on exam.  - Continue Coreg 6.25 mg bid.  - Start Lasix 20 mg daily with KCl 10 mEq daily. BMET in 10 days.  - Hold off on losartan/Entresto with recent AKI and also history of orthostatic symptoms.  2. CAD: S/p DES to mLAD with overlapping Promus stents in 11/14.  Of note, there was residual 50% stenosis just beyond the stent margin.  No ischemic symptoms.  - Continue ASA 81 mg daily.  - Myalgias with atorvastatin and crestor. Now taking Zetia.  3. Hyperlipidemia: Myalgias with atorvastatin and  Crestor, now taking Zetia. Check lipids.  4. CKD: Stage 3, recent AKI. Recent BMET stable, repeat in 10 days with addition of Lasix.  5. Carotid stenosis: s/p R CEA 09/2015 with Dr Trula Slade.  Stable dopplers in 9/21.    6. OSA: mild OSA on sleep study. Not interested in CPAP.  7. Mitral regurgitation: Moderate to possibly severe on last echo.  Given increased dyspnea, I think we need further evaluation.  He could be a Mitraclip candidate. - I will arrange for TEE to assess MR.  We discussed risks/benefits and he agrees to procedure.  8. Pulmonary: Possible abscess LUL on recent CT.  Also with emphysema.  - He has appt soon with pulmonary.   Followup with NP/PA in 1 month.   Loralie Champagne 08/04/2020

## 2020-08-04 NOTE — H&P (View-Only) (Signed)
Advanced Heart Failure Clinic Note   PCP: Dr. Sallyanne Havers [facility]) Cardiology: Dr. Aundra Dubin  86 y.o. male with history of recurrent small bowel obstruction and CAD with ischemic cardiomyopathy presents for followup.  In 9/14, patient was hospitalized with recurrent abdominal pain from small bowel obstruction.  This was managed conservatively with NG tube/NPO and IV fluid.  In the hospital, the patient developed pulmonary edema and actually ended up being intubated.  Troponin was normal. Echo was done showing EF 35-40% with anteroseptal, anterolateral, and apical akinesis along with moderate AI.  He had no prior known cardiac disease.  It was thought that the patient had had an out of hospital MI at some point then developed pulmonary edema with IVF while NPO.  He was diuresed and extubated.  Creatinine was up to about 1.6 in the hospital so no cardiac cath was done (not thought to be urgent given normal creatinine).   I took him for [facility] in 11/14.  This showed 95% mid LAD stenosis that was treated with 2 overlapping Promus DES.  Echo in 2/15 showed EF 30-35% with normal RV.  Coreg was cut back to 6.25 bid due to bradycardia and fatigue.   Had R CEA in 11/16 with Dr Trula Slade. He developed gynecomastia and had to stop spironolactone.  Eplerenone caused nausea/vomiting.    He was admitted in 12/18 with SBO, had ex lap with lysis of adhesions.  He was admitted again in 2/19 with SBO, again with ex lap and lysis of adhesions.    Lasix was stopped due to orthostatic symptoms, and he was admitted in 4/19 with dyspnea, found to have CHF.  He got IV Lasix and was restarted on po Lasix for home.   Echo in 4/19 with EF 30-35%, moderate LVH, mild to moderate AI, moderate MR.    In 5/21, he was admitted with recurrent SBO. In 7/21, he was admitted with PNA and GI bleeding from PUD.  In 8/21, he was admitted with AKI in setting of poor po intake (creatinine up to 3.25).    He continues to follow with oncology  for treatment of CML.   Echo in 9/21 showed EF 30-35%, mild LVH, normal RV, at least moderate and possibly severe mitral regurgitation, moderate AI. CT chest in 9/21 showed emphysema and a LUL consolidation with air-fluid levels in a bulla consistent with small abscess.   He returns today for HF follow up. He is on doxycycline for possible lung abscess, no fever.  Over the summer, he has been more short of breath.  He is dyspneic with moderate exertion likely walking up stairs.  He is walking with a walker.  He is doing less than he did in the past. He occasionally uses Lasix if he has ankle edema.  Weight stable.    Labs (9/14): K 3.4, creatinine 1.55 => 1.4, LDL 89, HDL 38 Labs (11/14): K 4.9, creatinine 1.6 Labs (1/15): K 4.1, creatinine 1.4 Labs (2/15): LDL 34, HDL 32 Labs (2/16): K 4.5, creatinine 1.5 Labs (8/16): LDL 26, HDL 33 Labs (11/16): K 3.4, creatinine 1.49 Labs (4/17): K 4, creatinine 1.65 Labs (4/18): K 4.4, creatinine 1.4 Labs (5/18): K 4.3, creatinine 1.68, LDL 21, HDL 23 Labs (4/19): K 4.2, creatinine 1.4, LDL 72 Labs (4/21): LDL 52 Labs (8/21): K 5.5, creatinine 1.6, hgb 9.3 Labs (9/21): K 4, creatinine 1.26  PMH: 1. Nephrolithiasis 2. H/o CCY 3. H/o diverticulitis 4. H/o SBO: initially in 1/14 with small bowel resection,  again in 9/14 treated conservatively.  Ex laps for lysis of adhesions in 12/18 and 2/19.  5. Ischemic cardiomyopathy: Echo (9/14) with EF 35-40%, anteroseptal/anterolateral/apical akinesis and moderate AI.  Patient had acute pulmonary edema while hospitalized in 9/14. Echo (2/15) with EF 30-35% with wall motion abnormalities, normal RV size and systolic function, mild MR and mild AI.  - Echo (4/19): EF 30-35% with regional wall motion abnormalities, moderate LVH, mild-moderate AI, moderate MR, PASP 59 mmHg.  - Echo (9/21): EF 30-35%, mild LVH, normal RV, at least moderate and possibly severe mitral regurgitation, moderate AI.  6. CKD stage 3 7.  Aortic insufficiency: mild on last echo 8. CAD: LHC (11/14) with 95% mLAD stenosis treated with 2 overlapping Promus DES.  There was residual 50% stenosis beyond the stent margin.   9. Carotid stenosis: 2/16 carotid dopplers with 80-99% RICA stenosis, 35-36% LICA stenosis. CTA neck showed 90% RICA stenosis. Right CEA 10/16.  - Carotid dopplers (9/21): mild bilateral ICA stenosis.  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 COPD: Emphysema on chest CT.  12. Hyperlipidemia: Myalgias with atorvastatin and Crestor.  13. OSA: Does not want CPAP.   SH: widower (2nd time) with 4 children.  Lives in [place].  Previous heavy smoker.  Retired Chief Financial Officer.   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   Review of systems complete and found to be negative unless listed in HPI.     Current Outpatient Medications  Medication Sig Dispense Refill  . acetaminophen (TYLENOL) 325 MG tablet Take 650 mg by mouth every 6 (six) hours as needed for mild pain or headache.    . Ascorbic Acid (VITAMIN C) 500 MG CAPS Take 500 mg by mouth daily.     Marland Kitchen aspirin EC 81 MG tablet Take 1 tablet (81 mg total) by mouth daily. 90 tablet 3  . BOSULIF 400 MG tablet Take 400 mg by mouth daily with breakfast.    . carvedilol (COREG) 6.25 MG tablet Take 1 tablet (6.25 mg total) by mouth 2 (two) times daily. 60 tablet 3  . cyanocobalamin 1000 MCG tablet Take 1,000 mcg by mouth daily.    . diclofenac Sodium (VOLTAREN) 1 % GEL Apply 2 g topically 4 (four) times daily as needed (pain).    Marland Kitchen diphenhydramine-acetaminophen (TYLENOL PM) 25-500 MG TABS tablet Take 2 tablets by mouth at bedtime as needed (sleep).     . ezetimibe (ZETIA) 10 MG tablet Take 1 tablet (10 mg total) by mouth daily. 90 tablet 3  . famotidine (PEPCID) 20 MG tablet Take 20 mg by mouth as needed for heartburn or indigestion.    . Ferrous Sulfate (IRON) 325 (65 Fe) MG TABS Take 325 mg by mouth daily.     . hydrocortisone cream 1 % Apply 1 application topically 2 (two) times  daily as needed for itching.    . loperamide (IMODIUM) 2 MG capsule Take 2 mg by mouth as needed for diarrhea or loose stools.     . pantoprazole (PROTONIX) 40 MG tablet Take 1 tablet (40 mg total) by mouth 2 (two) times daily before a meal. 180 tablet 1  . furosemide (LASIX) 20 MG tablet Take 1 tablet (20 mg total) by mouth daily. 30 tablet 3  . potassium chloride (KLOR-CON) 10 MEQ tablet Take 1 tablet (10 mEq total) by mouth daily. 30 tablet 3   No current facility-administered medications for this encounter.    BP 125/75   Pulse 81   Wt 53.3 kg (117 lb  9.6 oz)   SpO2 95%   BMI 17.37 kg/m  General: NAD Neck: JVP 8 cm, no thyromegaly or thyroid nodule.  Lungs: Clear to auscultation bilaterally with normal respiratory effort. CV: Nondisplaced PMI.  Heart regular S1/S2, no S3/S4, 2/6 HSM apex.  Trace ankle edema.  No carotid bruit.  Normal pedal pulses.  Abdomen: Soft, nontender, no hepatosplenomegaly, no distention.  Skin: Intact without lesions or rashes.  Neurologic: Alert and oriented x 3.  Psych: Normal affect. Extremities: No clubbing or cyanosis.  HEENT: Normal.   Assessment/Plan: 1. Chronic systolic CHF: Ischemic cardiomyopathy, EF 30-35% on 9/21 echo. Unable to take spironolactone (gynecomastia) or eplerenone (nausea/vomiting).  He has been off losartan as well as Lasix since 8/21 admission with AKI.  NYHA class II-III symptoms, somewhat progressive.  Possible mild volume overload on exam.  - Continue Coreg 6.25 mg bid.  - Start Lasix 20 mg daily with KCl 10 mEq daily. BMET in 10 days.  - Hold off on losartan/Entresto with recent AKI and also history of orthostatic symptoms.  2. CAD: S/p DES to mLAD with overlapping Promus stents in 11/14.  Of note, there was residual 50% stenosis just beyond the stent margin.  No ischemic symptoms.  - Continue ASA 81 mg daily.  - Myalgias with atorvastatin and crestor. Now taking Zetia.  3. Hyperlipidemia: Myalgias with atorvastatin and  Crestor, now taking Zetia. Check lipids.  4. CKD: Stage 3, recent AKI. Recent BMET stable, repeat in 10 days with addition of Lasix.  5. Carotid stenosis: s/p R CEA 09/2015 with Dr Trula Slade.  Stable dopplers in 9/21.    6. OSA: mild OSA on sleep study. Not interested in CPAP.  7. Mitral regurgitation: Moderate to possibly severe on last echo.  Given increased dyspnea, I think we need further evaluation.  He could be a Mitraclip candidate. - I will arrange for TEE to assess MR.  We discussed risks/benefits and he agrees to procedure.  8. Pulmonary: Possible abscess LUL on recent CT.  Also with emphysema.  - He has appt soon with pulmonary.   Followup with NP/PA in 1 month.   Loralie Champagne 08/04/2020

## 2020-08-09 ENCOUNTER — Other Ambulatory Visit (HOSPITAL_COMMUNITY)
Admission: RE | Admit: 2020-08-09 | Discharge: 2020-08-09 | Disposition: A | Discharge status: Home / Self Care | Payer: Medicare HMO | Source: Ambulatory Visit | Attending: Cardiology | Admitting: Cardiology

## 2020-08-09 DIAGNOSIS — Z01812 Encounter for preprocedural laboratory examination: Secondary | ICD-10-CM | POA: Diagnosis present

## 2020-08-09 DIAGNOSIS — Z20822 Contact with and (suspected) exposure to covid-19: Secondary | ICD-10-CM | POA: Insufficient documentation

## 2020-08-09 LAB — SARS CORONAVIRUS 2 (TAT 6-24 HRS): SARS Coronavirus 2: NEGATIVE

## 2020-08-11 ENCOUNTER — Ambulatory Visit (HOSPITAL_COMMUNITY)
Admission: RE | Admit: 2020-08-11 | Discharge: 2020-08-11 | Disposition: A | Discharge status: Home / Self Care | Payer: Medicare HMO | Attending: Cardiology | Admitting: Cardiology

## 2020-08-11 ENCOUNTER — Ambulatory Visit (HOSPITAL_BASED_OUTPATIENT_CLINIC_OR_DEPARTMENT_OTHER)
Admission: RE | Admit: 2020-08-11 | Discharge: 2020-08-11 | Disposition: A | Discharge status: Home / Self Care | Payer: Medicare HMO | Source: Ambulatory Visit | Attending: Cardiology | Admitting: Cardiology

## 2020-08-11 ENCOUNTER — Encounter (HOSPITAL_COMMUNITY)
Admission: RE | Disposition: A | Discharge status: Home / Self Care | Payer: Self-pay | Source: Home / Self Care | Attending: Cardiology

## 2020-08-11 ENCOUNTER — Ambulatory Visit (HOSPITAL_COMMUNITY): Payer: Medicare HMO | Admitting: Certified Registered"

## 2020-08-11 ENCOUNTER — Encounter (HOSPITAL_COMMUNITY): Payer: Self-pay | Admitting: Cardiology

## 2020-08-11 ENCOUNTER — Other Ambulatory Visit: Payer: Self-pay

## 2020-08-11 DIAGNOSIS — I340 Nonrheumatic mitral (valve) insufficiency: Secondary | ICD-10-CM | POA: Diagnosis not present

## 2020-08-11 DIAGNOSIS — E785 Hyperlipidemia, unspecified: Secondary | ICD-10-CM | POA: Diagnosis not present

## 2020-08-11 DIAGNOSIS — G4733 Obstructive sleep apnea (adult) (pediatric): Secondary | ICD-10-CM | POA: Diagnosis not present

## 2020-08-11 DIAGNOSIS — N183 Chronic kidney disease, stage 3 unspecified: Secondary | ICD-10-CM | POA: Insufficient documentation

## 2020-08-11 DIAGNOSIS — Z955 Presence of coronary angioplasty implant and graft: Secondary | ICD-10-CM | POA: Diagnosis not present

## 2020-08-11 DIAGNOSIS — M791 Myalgia, unspecified site: Secondary | ICD-10-CM | POA: Insufficient documentation

## 2020-08-11 DIAGNOSIS — Z87891 Personal history of nicotine dependence: Secondary | ICD-10-CM | POA: Diagnosis not present

## 2020-08-11 DIAGNOSIS — I080 Rheumatic disorders of both mitral and aortic valves: Secondary | ICD-10-CM | POA: Diagnosis present

## 2020-08-11 DIAGNOSIS — Z79899 Other long term (current) drug therapy: Secondary | ICD-10-CM | POA: Diagnosis not present

## 2020-08-11 DIAGNOSIS — J439 Emphysema, unspecified: Secondary | ICD-10-CM | POA: Insufficient documentation

## 2020-08-11 DIAGNOSIS — I5022 Chronic systolic (congestive) heart failure: Secondary | ICD-10-CM | POA: Insufficient documentation

## 2020-08-11 DIAGNOSIS — I2510 Atherosclerotic heart disease of native coronary artery without angina pectoris: Secondary | ICD-10-CM | POA: Insufficient documentation

## 2020-08-11 DIAGNOSIS — Z7982 Long term (current) use of aspirin: Secondary | ICD-10-CM | POA: Diagnosis not present

## 2020-08-11 DIAGNOSIS — I6523 Occlusion and stenosis of bilateral carotid arteries: Secondary | ICD-10-CM | POA: Insufficient documentation

## 2020-08-11 DIAGNOSIS — I255 Ischemic cardiomyopathy: Secondary | ICD-10-CM | POA: Diagnosis not present

## 2020-08-11 HISTORY — DX: Sleep apnea, unspecified: G47.30

## 2020-08-11 HISTORY — PX: TEE WITHOUT CARDIOVERSION: SHX5443

## 2020-08-11 SURGERY — ECHOCARDIOGRAM, TRANSESOPHAGEAL
Anesthesia: Monitor Anesthesia Care

## 2020-08-11 MED ORDER — PHENYLEPHRINE HCL-NACL 10-0.9 MG/250ML-% IV SOLN
INTRAVENOUS | Status: DC | PRN
  Administered 2020-08-11: 30 ug/min via INTRAVENOUS

## 2020-08-11 MED ORDER — SODIUM CHLORIDE 0.9 % IV SOLN: INTRAVENOUS | Status: DC | PRN

## 2020-08-11 MED ORDER — PROPOFOL 500 MG/50ML IV EMUL
INTRAVENOUS | Status: DC | PRN
  Administered 2020-08-11: 60 ug/kg/min via INTRAVENOUS

## 2020-08-11 MED ORDER — LIDOCAINE 2% (20 MG/ML) 5 ML SYRINGE
INTRAMUSCULAR | Status: DC | PRN
  Administered 2020-08-11: 40 mg via INTRAVENOUS

## 2020-08-11 MED ORDER — PROPOFOL 10 MG/ML IV BOLUS
INTRAVENOUS | Status: DC | PRN
  Administered 2020-08-11: 50 mg via INTRAVENOUS

## 2020-08-11 NOTE — Discharge Instructions (Signed)

## 2020-08-11 NOTE — CV Procedure (Signed)
Procedure: TEE  Indication: Mitral regurgitation  Sedation: Per anesthesiology  Findings: Please see echo section for full report.  Normal LV size with mild LV hypertrophy.  Mid to apical anterior and anteroseptal akinesis and akinesis of the apex with EF 30-35%.  Normal RV size and systolic function.  Mild left atrial enlargement.  Normal right atriuim.  No PFO or ASD by color doppler.  Trivial TR, unable to estimate RV-RA gradient.  Mitral regurgitation appeared only mild-moderate with no systolic flow reversal in the pulmonary vein doppler signal.  The aortic valve was trileaflet, moderately thickened with no stenosis but probably moderate aortic insufficiency (no holodiastolic flow reversal in the ascending aorta doppler signal).  Normal caliber thoracic aorta with grade IV plaque descending thoracic aorta.   Impression: Mild-moderate MR, moderate AI.   [name] [name] 08/11/2020 12:00 PM

## 2020-08-11 NOTE — Anesthesia Postprocedure Evaluation (Signed)
Anesthesia Post Note  Patient: [name] [name]  Procedure(s) Performed: TRANSESOPHAGEAL ECHOCARDIOGRAM (TEE) (N/A )     Patient location during evaluation: Endoscopy Anesthesia Type: MAC Level of consciousness: awake and alert Pain management: pain level controlled Vital Signs Assessment: post-procedure vital signs reviewed and stable Respiratory status: spontaneous breathing and respiratory function stable Cardiovascular status: stable Postop Assessment: no apparent nausea or vomiting Anesthetic complications: no   No complications documented.  Last Vitals:  Vitals:   08/11/20 1208 08/11/20 1214  BP: (!) 123/47 135/60  Pulse: 70 67  Resp: (!) 23 (!) 21  Temp:    SpO2: 100% 99%    Last Pain:  Vitals:   08/11/20 1214  TempSrc:   PainSc: 0-No pain                 Clifford Coudriet DANIEL

## 2020-08-11 NOTE — Progress Notes (Signed)
  Echocardiogram Echocardiogram Transesophageal has been performed.  Jennette Dubin 08/11/2020, 12:42 PM

## 2020-08-11 NOTE — Interval H&P Note (Signed)
History and Physical Interval Note:  08/11/2020 11:33 AM  [name] [name]  has presented today for surgery, with the diagnosis of [place].  The various methods of treatment have been discussed with the patient and family. After consideration of risks, benefits and other options for treatment, the patient has consented to  Procedure(s): TRANSESOPHAGEAL ECHOCARDIOGRAM (TEE) (N/A) as a surgical intervention.  The patient's history has been reviewed, patient examined, no change in status, stable for surgery.  I have reviewed the patient's chart and labs.  Questions were answered to the patient's satisfaction.     Matilda Fleig [employer]

## 2020-08-11 NOTE — Anesthesia Preprocedure Evaluation (Addendum)
Anesthesia Evaluation  Patient identified by MRN, date of birth, ID band Patient awake    Reviewed: Allergy & Precautions, NPO status , Patient's Chart, lab work & pertinent test results, reviewed documented beta blocker date and time   History of Anesthesia Complications Negative for: history of anesthetic complications  Airway Mallampati: II  TM Distance: >3 FB Neck ROM: Full    Dental  (+) Chipped, Dental Advisory Given, Partial Upper, Partial Lower   Pulmonary former smoker,  05/31/2020 SARS coronavirus NEG H/o vocal cord polyp: s/p collagen injection   breath sounds clear to auscultation       Cardiovascular hypertension, Pt. on medications and Pt. on home beta blockers (-) angina+ CAD, + Cardiac Stents and +CHF  + Valvular Problems/Murmurs MR and AI  Rhythm:Regular Rate:Normal  21 Echo IMPRESSIONS    1. Left ventricular ejection fraction, by estimation, is 30 to 35%. The  left ventricle has moderately decreased function. The left ventricle  demonstrates regional wall motion abnormalities (see scoring  diagram/findings for description). There is mild  concentric left ventricular hypertrophy. Left ventricular diastolic  function could not be evaluated. The average left ventricular global  longitudinal strain is -7.7 %.  2. Right ventricular systolic function is normal. The right ventricular  size is normal. There is moderately elevated pulmonary artery systolic  pressure. The estimated right ventricular systolic pressure is 02.5 mmHg.  3. Left atrial size was mildly dilated.  4. Right atrial size was mildly dilated.  5. There is eccentric mitral regurgitation that is anteriorly directed.  It is not well quantitated on this study. The eccentricity into the  posterior LA suggests at least moderate and possibly severe regurgitation  is present (flow reversal seen in the R  upper pulmonary vein. BP possibly influencing  this (174/76). Consider a  TEE for better characterization if clinically indicated. The mitral valve  is degenerative. Moderate to severe mitral valve regurgitation. No  evidence of mitral stenosis.  6. The aortic valve is tricuspid. Aortic valve regurgitation is moderate.  No aortic stenosis is present. Aortic regurgitation PHT measures 405 msec.  7. Pulmonic valve regurgitation is moderate.  8. The inferior vena cava is normal in size with <50% respiratory  variability, suggesting right atrial pressure of 8 mmHg.   Comparison(s): No significant change from prior study. EF remains 30-35%  with WMA similar to prior. MR appears severe but BP severely elevated. AI  remains moderate.  '19 ECHO:  moderate LV concentric hypertrophy. Systolic function was moderately toseverely reduced. EF 30-35%. Akinesis of the mid-apicalanteroseptal,anterior, and apical myocardium. Severe hypokinesis of the inferior myocardium. Mild-mod AI, mod MR   Neuro/Psych negative neurological ROS     GI/Hepatic Neg liver ROS, GERD  Medicated and Controlled,  Endo/Other  negative endocrine ROS  Renal/GU Renal InsufficiencyRenal disease (creat 1.65)     Musculoskeletal   Abdominal   Peds  Hematology  (+) Blood dyscrasia (Hb 7.6), anemia , CML   Anesthesia Other Findings   Reproductive/Obstetrics                            Anesthesia Physical  Anesthesia Plan  ASA: IV  Anesthesia Plan: MAC   Post-op Pain Management:    Induction:   PONV Risk Score and Plan: 1 and Treatment may vary due to age or medical condition  Airway Management Planned: Natural Airway and Nasal Cannula  Additional Equipment: None  Intra-op Plan:   Post-operative Plan:  Informed Consent: I have reviewed the patients History and Physical, chart, labs and discussed the procedure including the risks, benefits and alternatives for the proposed anesthesia with the patient or authorized  representative who has indicated his/her understanding and acceptance.     Dental advisory given  Plan Discussed with: CRNA and Surgeon  Anesthesia Plan Comments:         Anesthesia Quick Evaluation

## 2020-08-11 NOTE — Transfer of Care (Signed)
Immediate Anesthesia Transfer of Care Note  Patient: Carnelius Hammitt  Procedure(s) Performed: TRANSESOPHAGEAL ECHOCARDIOGRAM (TEE) (N/A )  Patient Location: PACU  Anesthesia Type:MAC  Level of Consciousness: awake, alert , oriented and patient cooperative  Airway & Oxygen Therapy: Patient Spontanous Breathing and Patient connected to nasal cannula oxygen  Post-op Assessment: Report given to RN and Post -op Vital signs reviewed and stable  Post vital signs: Reviewed and stable  Last Vitals:  Vitals Value Taken Time  BP 111/34 08/11/20 1158  Temp    Pulse 69 08/11/20 1159  Resp 20 08/11/20 1159  SpO2 100 % 08/11/20 1159  Vitals shown include unvalidated device data.  Last Pain:  Vitals:   08/11/20 1023  TempSrc: Oral  PainSc: 0-No pain         Complications: No complications documented.

## 2020-08-24 ENCOUNTER — Ambulatory Visit (HOSPITAL_COMMUNITY)
Admission: RE | Admit: 2020-08-24 | Discharge: 2020-08-24 | Disposition: A | Discharge status: Home / Self Care | Payer: Medicare HMO | Source: Ambulatory Visit | Attending: Adult Health | Admitting: Adult Health

## 2020-08-24 ENCOUNTER — Other Ambulatory Visit: Payer: Self-pay

## 2020-08-24 VITALS — BP 125/85 | HR 67 | Wt 115.8 lb

## 2020-08-24 DIAGNOSIS — I2510 Atherosclerotic heart disease of native coronary artery without angina pectoris: Secondary | ICD-10-CM | POA: Diagnosis not present

## 2020-08-24 DIAGNOSIS — Z79899 Other long term (current) drug therapy: Secondary | ICD-10-CM | POA: Insufficient documentation

## 2020-08-24 DIAGNOSIS — Z87891 Personal history of nicotine dependence: Secondary | ICD-10-CM | POA: Diagnosis not present

## 2020-08-24 DIAGNOSIS — J439 Emphysema, unspecified: Secondary | ICD-10-CM | POA: Insufficient documentation

## 2020-08-24 DIAGNOSIS — I340 Nonrheumatic mitral (valve) insufficiency: Secondary | ICD-10-CM | POA: Insufficient documentation

## 2020-08-24 DIAGNOSIS — Z7982 Long term (current) use of aspirin: Secondary | ICD-10-CM | POA: Diagnosis not present

## 2020-08-24 DIAGNOSIS — I255 Ischemic cardiomyopathy: Secondary | ICD-10-CM | POA: Diagnosis not present

## 2020-08-24 DIAGNOSIS — Z955 Presence of coronary angioplasty implant and graft: Secondary | ICD-10-CM | POA: Insufficient documentation

## 2020-08-24 DIAGNOSIS — I5022 Chronic systolic (congestive) heart failure: Secondary | ICD-10-CM | POA: Insufficient documentation

## 2020-08-24 DIAGNOSIS — N183 Chronic kidney disease, stage 3 unspecified: Secondary | ICD-10-CM | POA: Diagnosis not present

## 2020-08-24 DIAGNOSIS — G4733 Obstructive sleep apnea (adult) (pediatric): Secondary | ICD-10-CM | POA: Insufficient documentation

## 2020-08-24 DIAGNOSIS — N1831 Chronic kidney disease, stage 3a: Secondary | ICD-10-CM | POA: Diagnosis not present

## 2020-08-24 DIAGNOSIS — E785 Hyperlipidemia, unspecified: Secondary | ICD-10-CM | POA: Insufficient documentation

## 2020-08-24 LAB — BASIC METABOLIC PANEL
Anion gap: 11 (ref 5–15)
BUN: 23 mg/dL (ref 8–23)
CO2: 18 mmol/L — ABNORMAL LOW (ref 22–32)
Calcium: 8.3 mg/dL — ABNORMAL LOW (ref 8.9–10.3)
Chloride: 110 mmol/L (ref 98–111)
Creatinine, Ser: 1.24 mg/dL (ref 0.61–1.24)
GFR calc non Af Amer: 52 mL/min — ABNORMAL LOW (ref >60)
Glucose, Bld: 92 mg/dL (ref 70–99)
Potassium: 4.1 mmol/L (ref 3.5–5.1)
Sodium: 139 mmol/L (ref 135–145)

## 2020-08-24 MED ORDER — FUROSEMIDE 20 MG PO TABS
20.0000 mg | ORAL_TABLET | ORAL | 3 refills | Status: DC | PRN
Start: 2020-08-24 — End: 2020-10-04

## 2020-08-24 NOTE — Progress Notes (Signed)
ReDS Vest / Clip - 08/24/20 1433      ReDS Vest / Clip   Station Marker C    Ruler Value 26    ReDS Value Range Low volume    ReDS Actual Value 33    Anatomical Comments sitting

## 2020-08-24 NOTE — Progress Notes (Signed)
Advanced Heart Failure Clinic Note   PCP: Dr. Sallyanne Havers [facility]) Cardiology: Dr. Aundra Dubin  Mr [name] [name] is a 86 y.o. male with history of recurrent small bowel obstruction and CAD with ischemic cardiomyopathy presents for followup.  In 9/14, patient was hospitalized with recurrent abdominal pain from small bowel obstruction.  This was managed conservatively with NG tube/NPO and IV fluid.  In the hospital, the patient developed pulmonary edema and actually ended up being intubated.  Troponin was normal. Echo was done showing EF 35-40% with anteroseptal, anterolateral, and apical akinesis along with moderate AI.  He had no prior known cardiac disease.  It was thought that the patient had had an out of hospital MI at some point then developed pulmonary edema with IVF while NPO.  He was diuresed and extubated.  Creatinine was up to about 1.6 in the hospital so no cardiac cath was done (not thought to be urgent given normal creatinine).   I took him for [facility] in 11/14.  This showed 95% mid LAD stenosis that was treated with 2 overlapping Promus DES.  Echo in 2/15 showed EF 30-35% with normal RV.  Coreg was cut back to 6.25 bid due to bradycardia and fatigue.   Had R CEA in 11/16 with Dr Trula Slade. He developed gynecomastia and had to stop spironolactone.  Eplerenone caused nausea/vomiting.    He was admitted in 12/18 with SBO, had ex lap with lysis of adhesions.  He was admitted again in 2/19 with SBO, again with ex lap and lysis of adhesions.    Lasix was stopped due to orthostatic symptoms, and he was admitted in 4/19 with dyspnea, found to have CHF.  He got IV Lasix and was restarted on po Lasix for home.   Echo in 4/19 with EF 30-35%, moderate LVH, mild to moderate AI, moderate MR.    In 5/21, he was admitted with recurrent SBO. In 7/21, he was admitted with PNA and GI bleeding from PUD.  In 8/21, he was admitted with AKI in setting of poor po intake (creatinine up to 3.25).    He continues to follow  with oncology for treatment of CML.   Echo in 9/21 showed EF 30-35%, mild LVH, normal RV, at least moderate and possibly severe mitral regurgitation, moderate AI. CT chest in 9/21 showed emphysema and a LUL consolidation with air-fluid levels in a bulla consistent with small abscess.   Today he returns for HF follow up with his daughter. Overall feeling a little better. His daughter is concerned about his weight loss.  Riding stationary bike 5 minutes per day and walking on the treadmill 5 minutes per day. Denies SOB/PND/Orthopnea. Uses rolling walker in the house on  Appetite ok. No fever or chills. Weight at home 110-114  pounds. Taking all medications.    Labs (9/14): K 3.4, creatinine 1.55 => 1.4, LDL 89, HDL 38 Labs (11/14): K 4.9, creatinine 1.6 Labs (1/15): K 4.1, creatinine 1.4 Labs (2/15): LDL 34, HDL 32 Labs (2/16): K 4.5, creatinine 1.5 Labs (8/16): LDL 26, HDL 33 Labs (11/16): K 3.4, creatinine 1.49 Labs (4/17): K 4, creatinine 1.65 Labs (4/18): K 4.4, creatinine 1.4 Labs (5/18): K 4.3, creatinine 1.68, LDL 21, HDL 23 Labs (4/19): K 4.2, creatinine 1.4, LDL 72 Labs (4/21): LDL 52 Labs (8/21): K 5.5, creatinine 1.6, hgb 9.3 Labs (9/21): K 4, creatinine 1.26  PMH: 1. Nephrolithiasis 2. H/o CCY 3. H/o diverticulitis 4. H/o SBO: initially in 1/14 with small bowel resection,  again in 9/14 treated conservatively.  Ex laps for lysis of adhesions in 12/18 and 2/19.  5. Ischemic cardiomyopathy: Echo (9/14) with EF 35-40%, anteroseptal/anterolateral/apical akinesis and moderate AI.  Patient had acute pulmonary edema while hospitalized in 9/14. Echo (2/15) with EF 30-35% with wall motion abnormalities, normal RV size and systolic function, mild MR and mild AI.  - Echo (4/19): EF 30-35% with regional wall motion abnormalities, moderate LVH, mild-moderate AI, moderate MR, PASP 59 mmHg.  - Echo (9/21): EF 30-35%, mild LVH, normal RV, at least moderate and possibly severe mitral  regurgitation, moderate AI.  6. CKD stage 3 7. Aortic insufficiency: mild on last echo 8. CAD: LHC (11/14) with 95% mLAD stenosis treated with 2 overlapping Promus DES.  There was residual 50% stenosis beyond the stent margin.   9. Carotid stenosis: 2/16 carotid dopplers with 80-99% RICA stenosis, 83-38% LICA stenosis. CTA neck showed 90% RICA stenosis. Right CEA 10/16.  - Carotid dopplers (9/21): mild bilateral ICA stenosis.  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 COPD: Emphysema on chest CT.  12. Hyperlipidemia: Myalgias with atorvastatin and Crestor.  13. OSA: Does not want CPAP.   SH: widower (2nd time) with 4 children.  Lives in [place].  Previous heavy smoker.  Retired Chief Financial Officer.   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   Review of systems complete and found to be negative unless listed in HPI.     Current Outpatient Medications  Medication Sig Dispense Refill  . acetaminophen (TYLENOL) 325 MG tablet Take 650 mg by mouth every 6 (six) hours as needed for mild pain or headache.    . Ascorbic Acid (VITAMIN C) 500 MG CAPS Take 500 mg by mouth daily.     Marland Kitchen aspirin EC 81 MG tablet Take 1 tablet (81 mg total) by mouth daily. 90 tablet 3  . BOSULIF 400 MG tablet Take 400 mg by mouth daily with breakfast.    . carvedilol (COREG) 6.25 MG tablet Take 1 tablet (6.25 mg total) by mouth 2 (two) times daily. 60 tablet 3  . cyanocobalamin 1000 MCG tablet Take 1,000 mcg by mouth daily.    . diclofenac Sodium (VOLTAREN) 1 % GEL Apply 2 g topically 4 (four) times daily as needed (pain).    Marland Kitchen diphenhydramine-acetaminophen (TYLENOL PM) 25-500 MG TABS tablet Take 2 tablets by mouth at bedtime as needed (sleep).     . ezetimibe (ZETIA) 10 MG tablet Take 1 tablet (10 mg total) by mouth daily. 90 tablet 3  . famotidine (PEPCID) 20 MG tablet Take 20 mg by mouth as needed for heartburn or indigestion.    . Ferrous Sulfate (IRON) 325 (65 Fe) MG TABS Take 325 mg by mouth daily.     . furosemide (LASIX) 20  MG tablet Take 1 tablet (20 mg total) by mouth daily. 30 tablet 3  . hydrocortisone cream 1 % Apply 1 application topically 2 (two) times daily as needed for itching.    . loperamide (IMODIUM) 2 MG capsule Take 2 mg by mouth as needed for diarrhea or loose stools.     . pantoprazole (PROTONIX) 40 MG tablet Take 1 tablet (40 mg total) by mouth 2 (two) times daily before a meal. 180 tablet 1  . potassium chloride (KLOR-CON) 10 MEQ tablet Take 1 tablet (10 mEq total) by mouth daily. 30 tablet 3   No current facility-administered medications for this encounter.    BP 125/85   Pulse 67   Wt 52.5 kg (115 lb  12.8 oz)   SpO2 95%   BMI 17.10 kg/m   Wt Readings from Last 3 Encounters:  08/24/20 52.5 kg (115 lb 12.8 oz)  08/11/20 52.6 kg (116 lb)  08/03/20 53.3 kg (117 lb 9.6 oz)    General: Thin appearing. No resp difficulty. Used rolling walker in the clinic.  HEENT: normal Neck: supple. no JVD. Carotids 2+ bilat; no bruits. No lymphadenopathy or thryomegaly appreciated. Cor: PMI nondisplaced. Regular rate & rhythm. No rubs, gallops or murmurs. Lungs: clear Abdomen: soft, nontender, nondistended. No hepatosplenomegaly. No bruits or masses. Good bowel sounds. Extremities: no cyanosis, clubbing, rash, edema Neuro: alert & orientedx3, cranial nerves grossly intact. moves all 4 extremities w/o difficulty. Affect pleasant .   Assessment/Plan: 1. Chronic systolic CHF: Ischemic cardiomyopathy, EF 30-35% on 9/21 echo. Unable to take spironolactone (gynecomastia) or eplerenone (nausea/vomiting).  He has been off losartan as well as Lasix since 8/21 admission with AKI.  - NYHA II. Reds Clip 33%. Volume status stable. Change lasix as needed for weight 116 or greater.--> He will then take Lasix 20 mg daily with KCl 10 mEq daily. - Continue Coreg 6.25 mg bid.   - Hold off on losartan/Entresto with recent AKI and also history of orthostatic symptoms.  2. CAD: S/p DES to mLAD with overlapping Promus  stents in 11/14.  Of note, there was residual 50% stenosis just beyond the stent margin. No chest pain.  - Continue ASA 81 mg daily.  - Myalgias with atorvastatin and crestor. Now taking Zetia.  3. Hyperlipidemia: Myalgias with atorvastatin and Crestor, now taking Zetia. Check lipids.  4. CKD: Stage 3 Renal funciton stable on 08/24/20.  5. Carotid stenosis: s/p R CEA 09/2015 with Dr Trula Slade.  Stable dopplers in 9/21.    6. OSA: mild OSA on sleep study. Not interested in CPAP.  7. Mitral regurgitation: Moderate to possibly severe on last echo.   TEE 08/11/20 -Mild-moderate MR, moderate AI.  8. Pulmonary: Possible abscess LUL on recent CT.  Also with emphysema.  - He has appt soon with pulmonary.   Follow up in 6-8 weeks with Dr Aundra Dubin.  Edouard Gikas NP-C  08/24/2020

## 2020-08-24 NOTE — Patient Instructions (Signed)
Lasix 20mg  is AS NEEDED ONLY for when weight is 116lbs OR GREATER  Labs done today, your results will be available in MyChart, we will contact you for abnormal readings.  Your physician recommends that you return for a follow up appointment in 6-8 weeks   If you have any questions or concerns before your next appointment please send [place] a message through [place] or call our office at [phone].    TO LEAVE A MESSAGE FOR THE NURSE SELECT OPTION 2, PLEASE LEAVE A MESSAGE INCLUDING: . YOUR NAME . DATE OF BIRTH . CALL BACK NUMBER . REASON FOR CALL**this is important as we prioritize the call backs  YOU WILL RECEIVE A CALL BACK THE SAME DAY AS LONG AS YOU CALL BEFORE 4:00 PM

## 2020-09-03 ENCOUNTER — Other Ambulatory Visit (HOSPITAL_COMMUNITY): Payer: Self-pay | Admitting: *Deleted

## 2020-09-03 MED ORDER — POTASSIUM CHLORIDE ER 10 MEQ PO TBCR: 10.0000 meq | EXTENDED_RELEASE_TABLET | Freq: Every day | ORAL | 3 refills | Status: DC

## 2020-09-16 ENCOUNTER — Telehealth: Payer: Self-pay | Admitting: Gastroenterology

## 2020-09-16 DIAGNOSIS — R197 Diarrhea, unspecified: Secondary | ICD-10-CM

## 2020-09-16 NOTE — Telephone Encounter (Signed)
Thanks [employer]. If he's having worsening diarrhea despite taking higher doses of Immodium may want to do a GI pathogen panel to make sure nothing infectious going on. I would not take more immodium that he is taking right now. He can try some peptobismol in the interim. Thanks

## 2020-09-16 NOTE — Telephone Encounter (Signed)
Spoke with patient's daughter Bev regarding recommendations as outlined by Dr. Havery Moros. Discussed location of lab and lab hours that she can pick up stool kit for patient, advised that they will instruct her on how to collect the specimen. Advised to hold Imodium at this time and for patient to use Pepto bismol in the interim. Daughter states that she will get by the lab as soon as she can and will relay this information to the patient. Daughter verbalized understanding of all instructions, answered all of her questions, she had no other concerns at the end of the call.  Lab order in epic.

## 2020-09-16 NOTE — Telephone Encounter (Signed)
Spoke with patient's daughter, she states that patient has a history of diarrhea due to chemo meds. Daughter reports that it has been well controlled with Imodium everyday, until this weekend (Saturday) pt started having increased episodes of diarrhea. She states that they have been giving him up to 6-7 Imodium a day. Vomited that night or the next day - which is concerning. No abdominal pain or cramps. No fevers or blood in stool that patient has mentioned.  Daughter wants to know how many Imodium are safe to take in a day? Is kaopectate better for him? Should he schedule a follow up with you? Please advise, thank you

## 2020-09-17 NOTE — Telephone Encounter (Signed)
Pt's daughter is wanting to inform the nurse that the pt is feeling better and that he does not wish to do the stool kit, pt will call back if he has any further complications.

## 2020-09-17 NOTE — Telephone Encounter (Signed)
Noted, thanks!

## 2020-09-27 ENCOUNTER — Other Ambulatory Visit: Payer: Self-pay | Admitting: Physician Assistant

## 2020-09-27 ENCOUNTER — Ambulatory Visit (HOSPITAL_COMMUNITY)
Admission: RE | Admit: 2020-09-27 | Discharge: 2020-09-27 | Disposition: A | Discharge status: Home / Self Care | Payer: Medicare HMO | Source: Ambulatory Visit | Attending: Physician Assistant | Admitting: Physician Assistant

## 2020-09-27 ENCOUNTER — Other Ambulatory Visit: Payer: Self-pay

## 2020-09-27 DIAGNOSIS — R9389 Abnormal findings on diagnostic imaging of other specified body structures: Secondary | ICD-10-CM | POA: Diagnosis present

## 2020-09-27 IMAGING — MR MR 3D RECON AT SCANNER
19 of 20 series · 46 of 48 positions shown · IV contrast (gadavist)
Comparison: CT abdomen pelvis, [DATE]

CLINICAL DATA: Abnormal CT scan, history of gastric ulcer and GI
bleed, reported history of biliary ductal dilatation



[Series 3: T2 fat-sat · axial · 6.0mm · 1.19mm/px · z∈[-210,+42]mm · 2 of 36 slices shown]
[im 1/36]
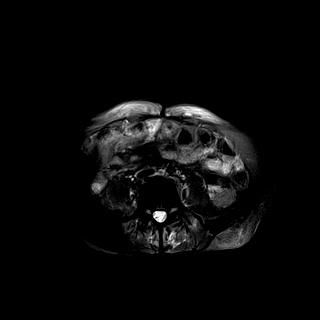
[im 36/36]
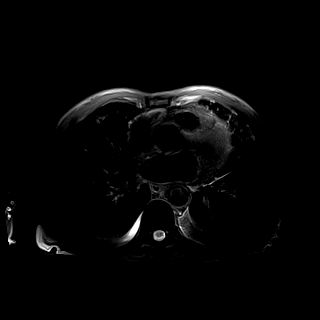

[Series 6: T2 · coronal · 6.0mm · 1.48mm/px · 1 of 30 slices shown (1 of 2)]
[im 1/30]
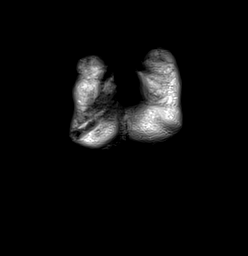

[Series 7: DWI · axial · 6.0mm · 1.49mm/px · z∈[-214,+38]mm · 3 of 72 slices shown (1 of 2)]
[im 1/72]
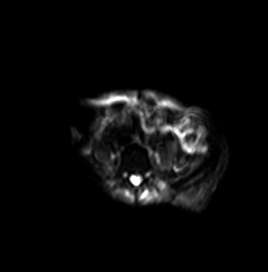
[im 36/72]
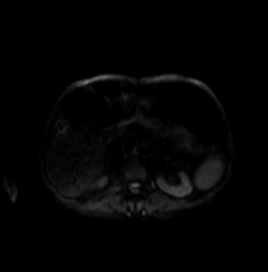
[im 72/72]
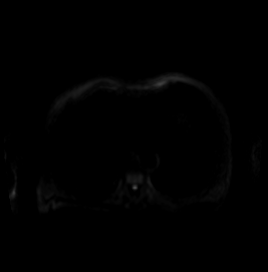

[Series 8: DWI · axial · 6.0mm · 1.49mm/px · 1 of 36 slices shown (2 of 2)]
[im 1/36]
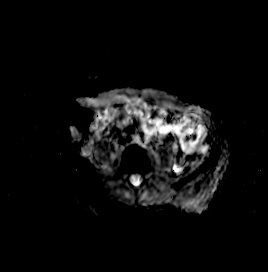

[Series 9: T1 · axial · 3.0mm · 1.25mm/px · z∈[-205,+32]mm · 3 of 80 slices shown (1 of 2)]
[im 1/80]
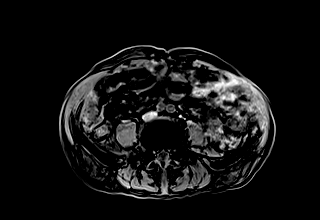
[im 40/80]
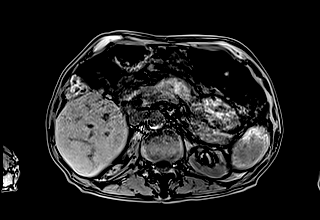
[im 80/80]
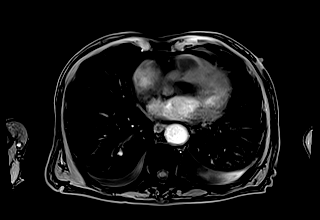

[Series 10: T1 · axial · 3.0mm · 1.25mm/px · z∈[-205,+32]mm · 3 of 80 slices shown (2 of 2)]
[im 1/80]
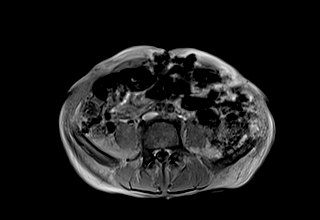
[im 40/80]
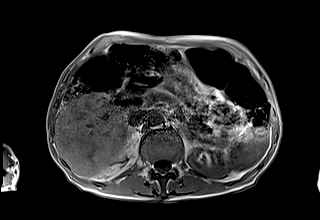
[im 80/80]
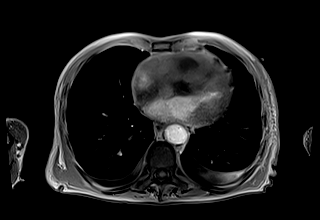

[Series 11: cor obl thk · sagittal · 50.0mm · 0.78mm/px · 1 of 6 slices shown]
[im 1/6]
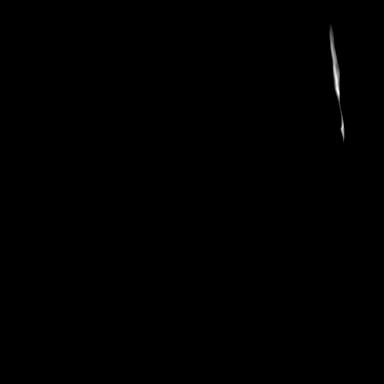

[Series 13: cor_3d_spc_trig · coronal · 1.0mm · 0.49mm/px · 2 of 68 slices shown]
[im 1/68]
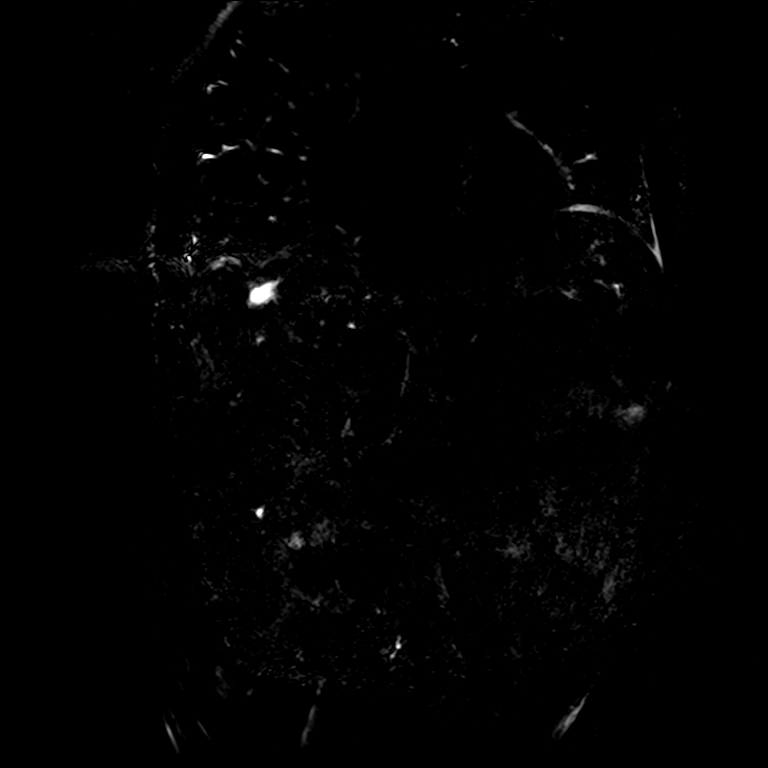
[im 68/68]
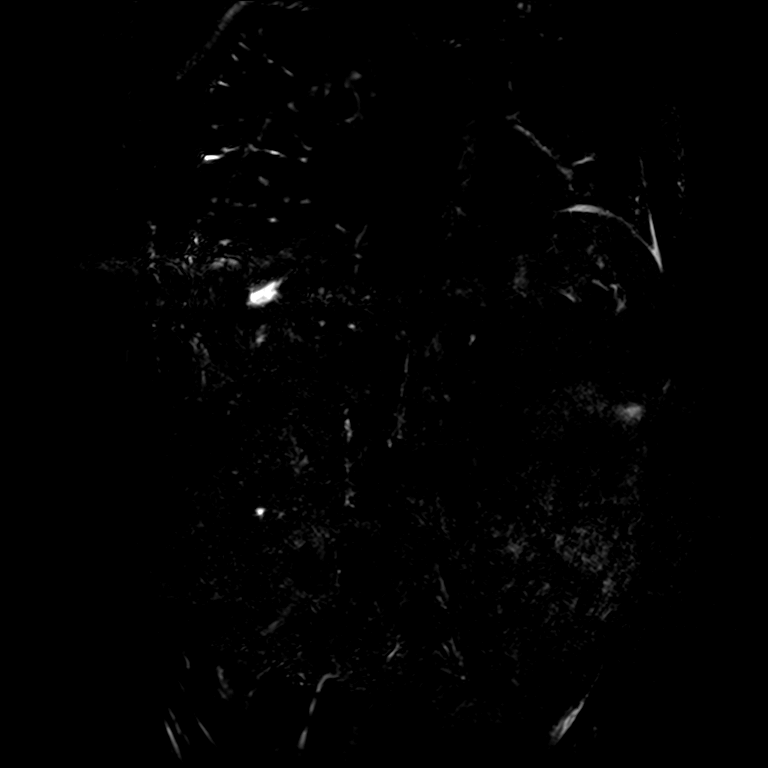

[Series 15: T2 · axial · 6.0mm · 1.56mm/px · 1 of 35 slices shown (2 of 2)]
[im 1/35]
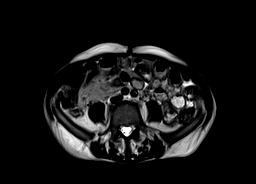

[Series 17: T1 dynamic · axial · 3.0mm · 1.25mm/px · z∈[-217,+44]mm · 3 of 88 slices shown (1 of 6)]
[im 1/88]
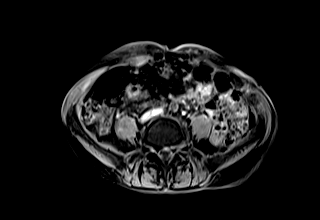
[im 44/88]
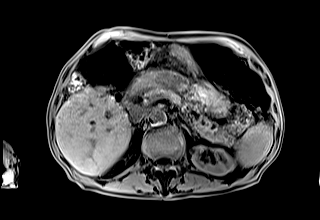
[im 88/88]
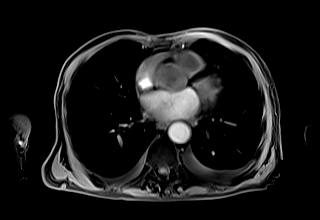

[Series 20: T1 dynamic · axial · 3.0mm · 1.25mm/px · z∈[-217,+44]mm · 3 of 88 slices shown (2 of 6)]
[im 1/88]
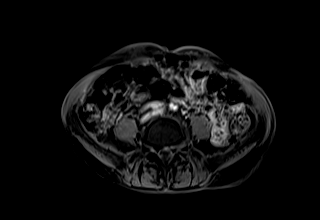
[im 44/88]
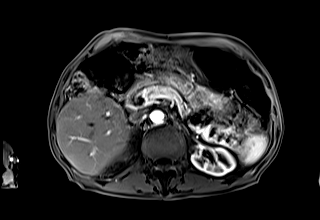
[im 88/88]
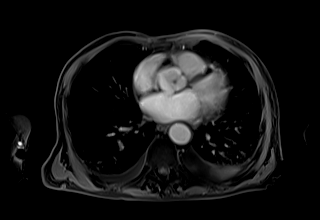

[Series 22: T1 dynamic · axial · 3.0mm · 1.25mm/px · z∈[-217,+44]mm · 3 of 88 slices shown (3 of 6)]
[im 1/88]
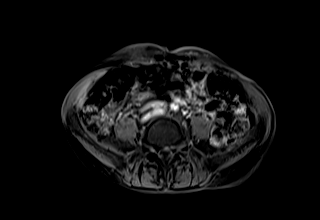
[im 44/88]
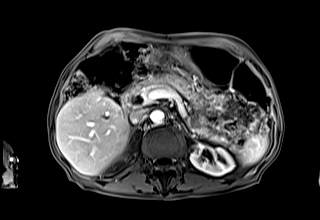
[im 88/88]
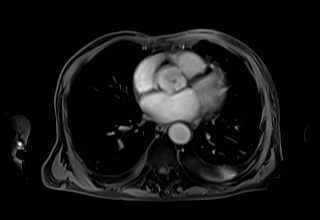

[Series 25: T1 dynamic · axial · 3.0mm · 1.25mm/px · z∈[-217,+44]mm · 3 of 88 slices shown (4 of 6)]
[im 1/88]
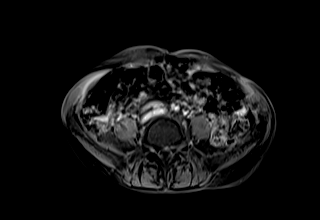
[im 44/88]
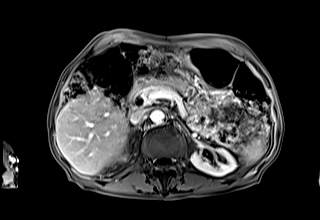
[im 88/88]
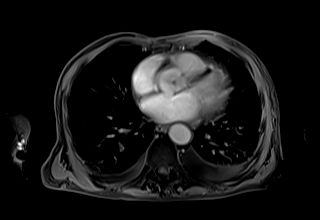

[Series 27: T1 dynamic · coronal · 4.0mm · 1.41mm/px · 2 of 56 slices shown (5 of 6)]
[im 1/56]
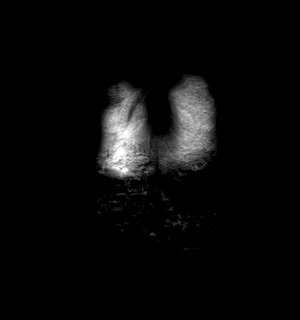
[im 56/56]
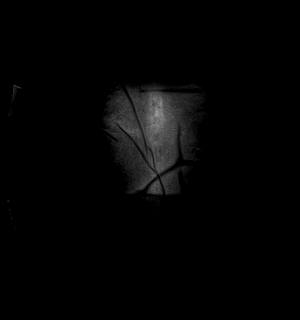

[Series 29: T1 dynamic · axial · 3.0mm · 1.25mm/px · z∈[-217,+44]mm · 3 of 88 slices shown (6 of 6)]
[im 1/88]
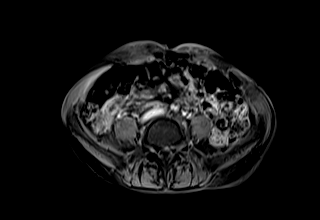
[im 44/88]
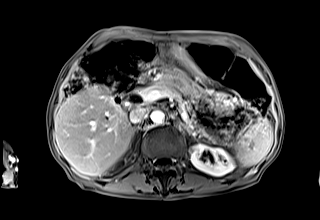
[im 88/88]
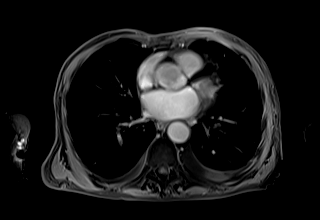

[Series 101: sub 20 sec · axial · 3.0mm · 1.25mm/px · z∈[-217,+44]mm · 3 of 88 slices shown]
[im 1/88]
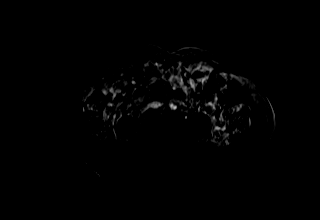
[im 44/88]
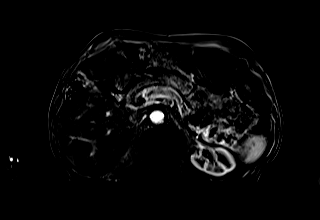
[im 88/88]
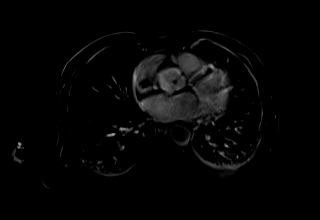

[Series 102: sub 45 sec · axial · 3.0mm · 1.25mm/px · z∈[-217,+44]mm · 3 of 88 slices shown]
[im 1/88]
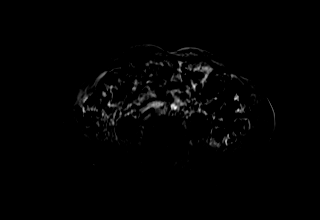
[im 44/88]
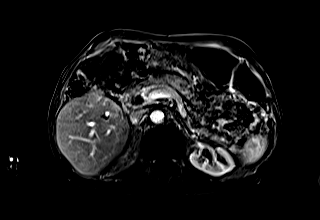
[im 88/88]
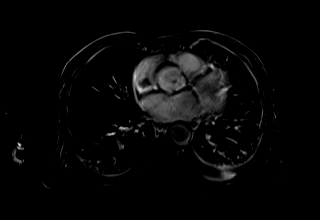

[Series 103: sub 90 sec · axial · 3.0mm · 1.25mm/px · z∈[-217,+44]mm · 3 of 88 slices shown]
[im 1/88]
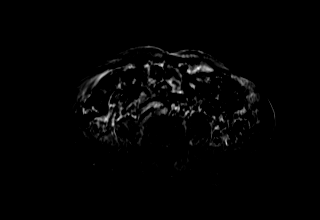
[im 44/88]
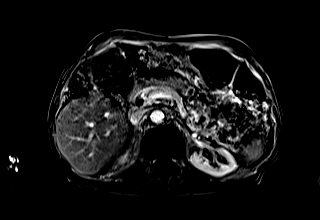
[im 88/88]
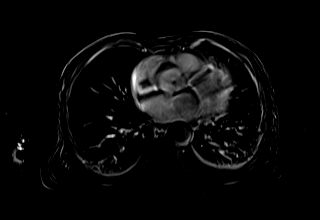

[Series 104: sub 3 min · axial · 3.0mm · 1.25mm/px · z∈[-217,+44]mm · 3 of 88 slices shown]
[im 1/88]
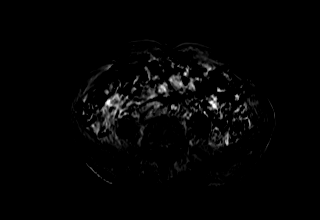
[im 44/88]
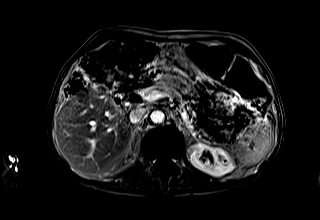
[im 88/88]
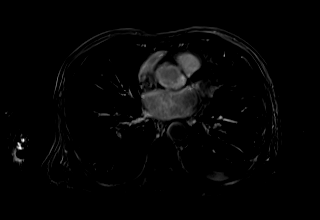

[46 of 48 positions shown; findings below may reference images not displayed]

FINDINGS: Lower chest: Small bilateral pleural effusions.

Hepatobiliary: No mass or other parenchymal abnormality identified.
Status post cholecystectomy. Unchanged postoperative biliary ductal
dilatation.

Pancreas: No mass, inflammatory changes, or other parenchymal
abnormality identified.

Spleen:  Within normal limits in size and appearance.

Adrenals/Urinary Tract: No masses identified. No evidence of
hydronephrosis.

Stomach/Bowel: Visualized portions within the abdomen are
unremarkable.

Vascular/Lymphatic: No pathologically enlarged lymph nodes
identified. Aortic atherosclerosis. Unchanged aneurysm of the
infrarenal abdominal aorta measuring 3.0 x 3.0 cm (series 20, image
64).

Other:  None.

Musculoskeletal: No suspicious bone lesions identified.
IMPRESSION: 1. Status post cholecystectomy. Unchanged postoperative biliary
ductal dilatation.
2. No MR abnormality of the stomach per reported history of gastric
ulcer and GI bleed. Please note that evaluation of the stomach by MR
is generally very limited.
3. Small bilateral pleural effusions.
4. Unchanged aneurysm of the infrarenal abdominal aorta measuring
3.0 x 3.0 cm. Recommend follow-up ultrasound every 3 years if
clinically appropriate. This recommendation follows ACR consensus
guidelines: White Paper of the ACR Incidental Findings Committee II
Atherosclerosis ([8X]-[8X]).

## 2020-09-27 IMAGING — MR MR ABDOMEN WO/W CM MRCP
19 of 20 series · 46 of 48 positions shown · IV contrast (gadavist)
Comparison: CT abdomen pelvis, [DATE]

CLINICAL DATA: Abnormal CT scan, history of gastric ulcer and GI
bleed, reported history of biliary ductal dilatation



[Series 3: T2 fat-sat · axial · 6.0mm · 1.19mm/px · z∈[-210,+42]mm · 2 of 36 slices shown]
[im 1/36]
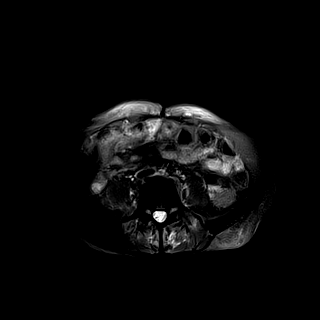
[im 36/36]
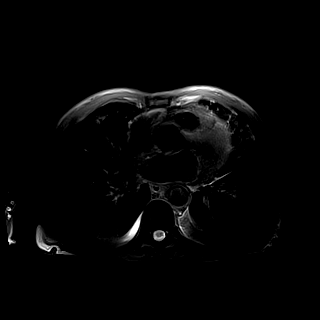

[Series 6: T2 · coronal · 6.0mm · 1.48mm/px · 1 of 30 slices shown (1 of 2)]
[im 1/30]
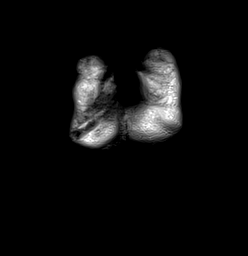

[Series 7: DWI · axial · 6.0mm · 1.49mm/px · z∈[-214,+38]mm · 3 of 72 slices shown (1 of 2)]
[im 1/72]
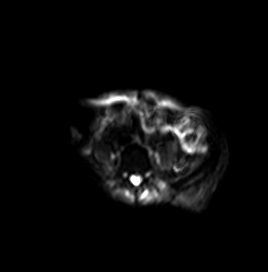
[im 36/72]
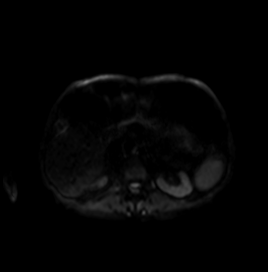
[im 72/72]
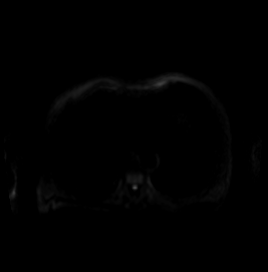

[Series 8: DWI · axial · 6.0mm · 1.49mm/px · 1 of 36 slices shown (2 of 2)]
[im 1/36]
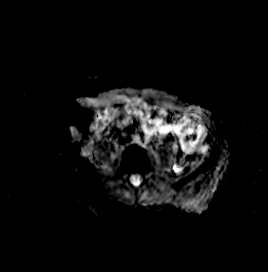

[Series 9: T1 · axial · 3.0mm · 1.25mm/px · z∈[-205,+32]mm · 3 of 80 slices shown (1 of 2)]
[im 1/80]
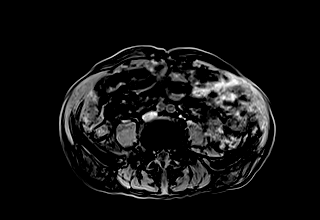
[im 40/80]
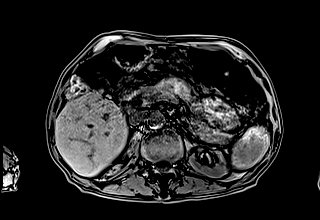
[im 80/80]
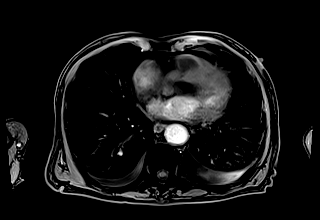

[Series 10: T1 · axial · 3.0mm · 1.25mm/px · z∈[-205,+32]mm · 3 of 80 slices shown (2 of 2)]
[im 1/80]
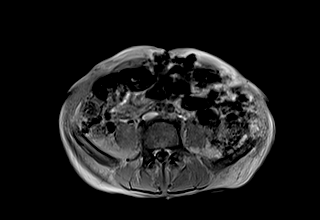
[im 40/80]
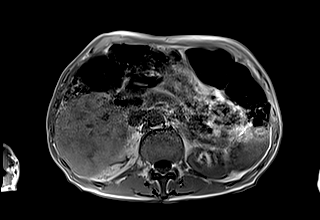
[im 80/80]
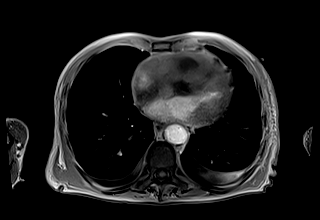

[Series 11: cor obl thk · sagittal · 50.0mm · 0.78mm/px · 1 of 6 slices shown]
[im 1/6]
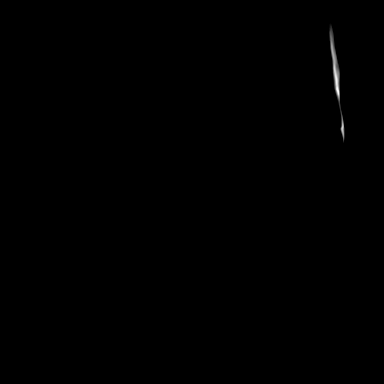

[Series 13: cor_3d_spc_trig · coronal · 1.0mm · 0.49mm/px · 2 of 68 slices shown]
[im 1/68]
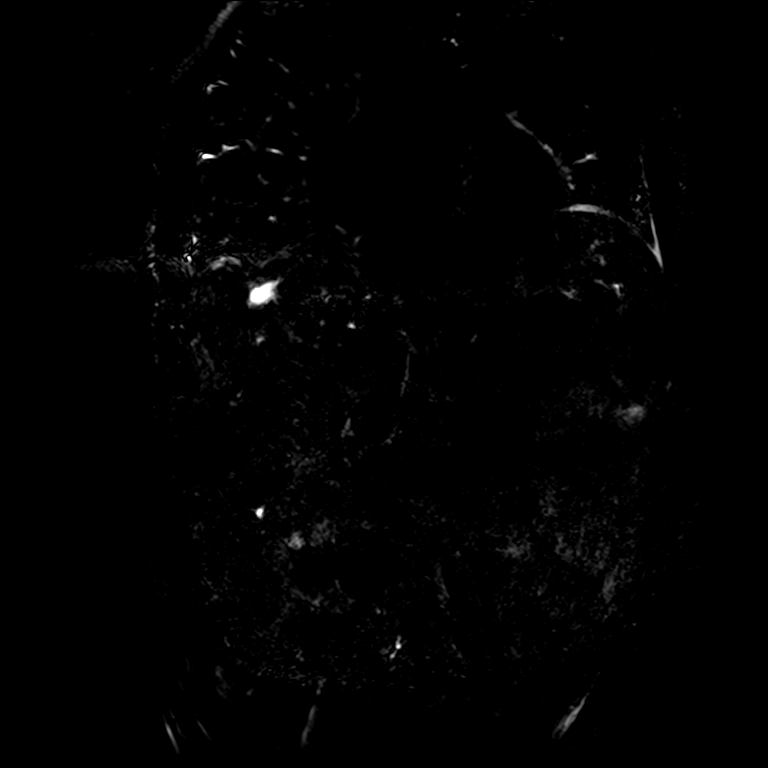
[im 68/68]
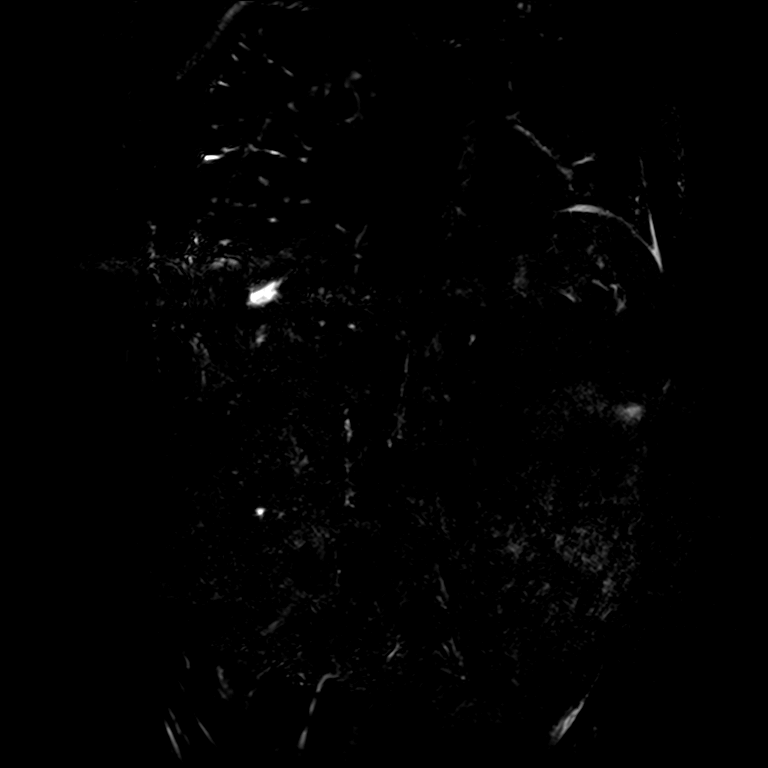

[Series 15: T2 · axial · 6.0mm · 1.56mm/px · 1 of 35 slices shown (2 of 2)]
[im 1/35]
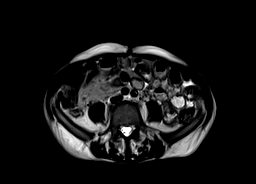

[Series 17: T1 dynamic · axial · 3.0mm · 1.25mm/px · z∈[-217,+44]mm · 3 of 88 slices shown (1 of 6)]
[im 1/88]
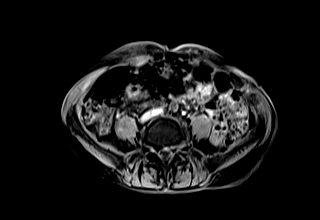
[im 44/88]
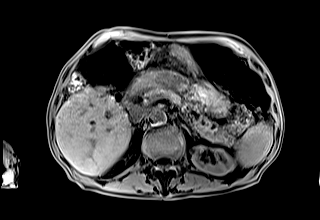
[im 88/88]
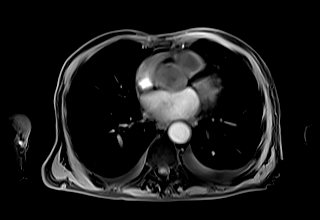

[Series 20: T1 dynamic · axial · 3.0mm · 1.25mm/px · z∈[-217,+44]mm · 3 of 88 slices shown (2 of 6)]
[im 1/88]
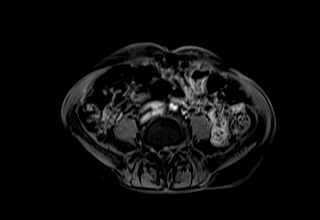
[im 44/88]
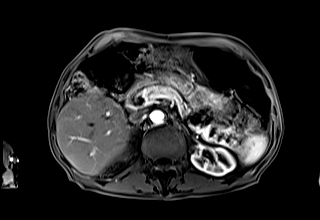
[im 88/88]
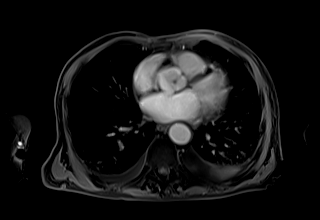

[Series 22: T1 dynamic · axial · 3.0mm · 1.25mm/px · z∈[-217,+44]mm · 3 of 88 slices shown (3 of 6)]
[im 1/88]
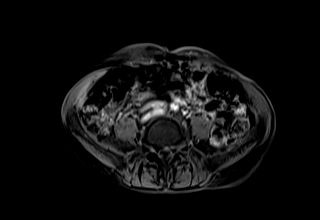
[im 44/88]
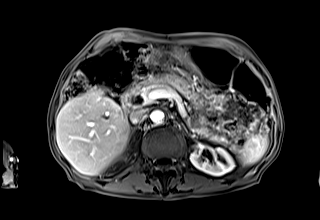
[im 88/88]
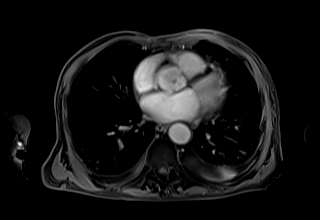

[Series 25: T1 dynamic · axial · 3.0mm · 1.25mm/px · z∈[-217,+44]mm · 3 of 88 slices shown (4 of 6)]
[im 1/88]
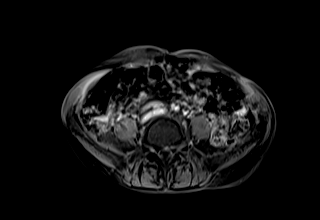
[im 44/88]
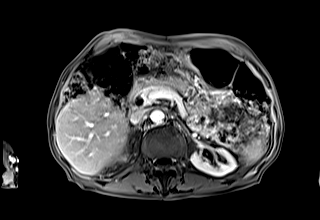
[im 88/88]
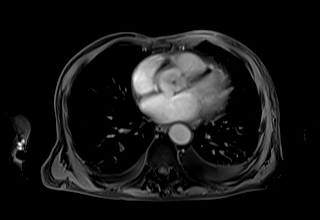

[Series 27: T1 dynamic · coronal · 4.0mm · 1.41mm/px · 2 of 56 slices shown (5 of 6)]
[im 1/56]
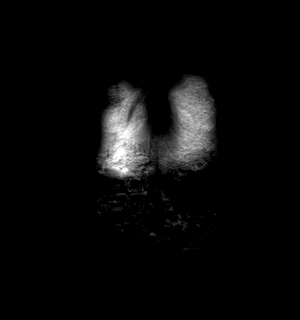
[im 56/56]
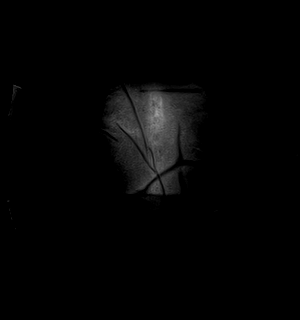

[Series 29: T1 dynamic · axial · 3.0mm · 1.25mm/px · z∈[-217,+44]mm · 3 of 88 slices shown (6 of 6)]
[im 1/88]
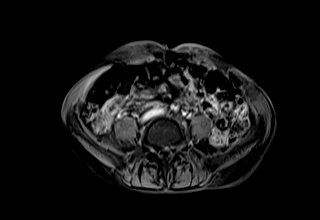
[im 44/88]
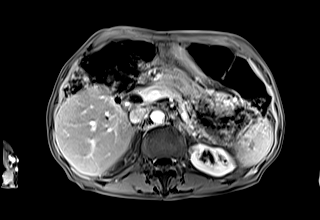
[im 88/88]
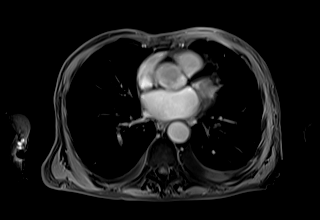

[Series 101: sub 20 sec · axial · 3.0mm · 1.25mm/px · z∈[-217,+44]mm · 3 of 88 slices shown]
[im 1/88]
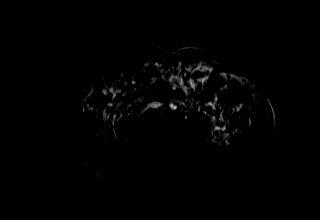
[im 44/88]
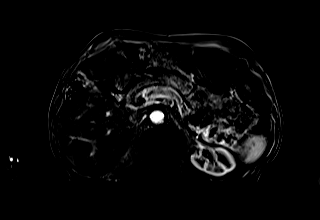
[im 88/88]
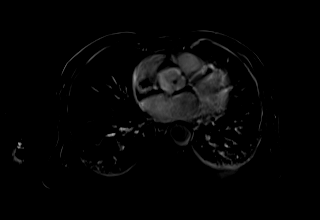

[Series 102: sub 45 sec · axial · 3.0mm · 1.25mm/px · z∈[-217,+44]mm · 3 of 88 slices shown]
[im 1/88]
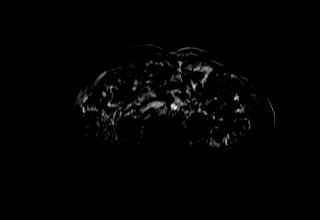
[im 44/88]
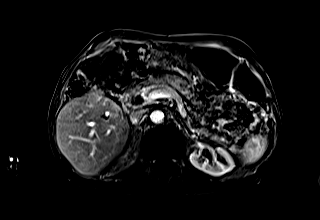
[im 88/88]
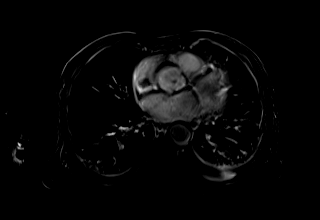

[Series 103: sub 90 sec · axial · 3.0mm · 1.25mm/px · z∈[-217,+44]mm · 3 of 88 slices shown]
[im 1/88]
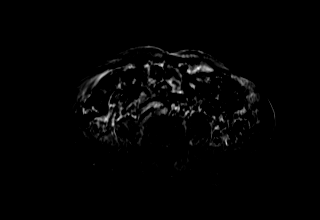
[im 44/88]
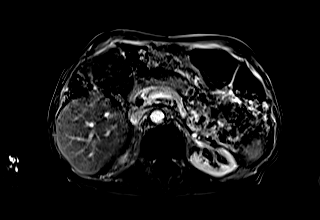
[im 88/88]
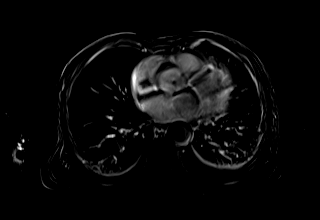

[Series 104: sub 3 min · axial · 3.0mm · 1.25mm/px · z∈[-217,+44]mm · 3 of 88 slices shown]
[im 1/88]
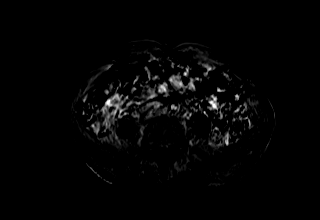
[im 44/88]
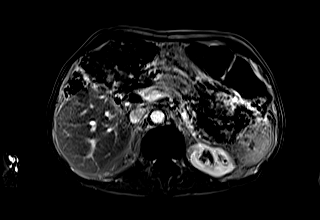
[im 88/88]
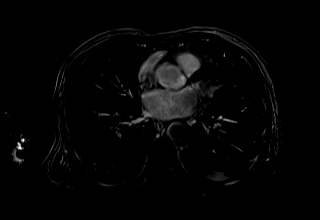

[46 of 48 positions shown; findings below may reference images not displayed]

FINDINGS: Lower chest: Small bilateral pleural effusions.

Hepatobiliary: No mass or other parenchymal abnormality identified.
Status post cholecystectomy. Unchanged postoperative biliary ductal
dilatation.

Pancreas: No mass, inflammatory changes, or other parenchymal
abnormality identified.

Spleen:  Within normal limits in size and appearance.

Adrenals/Urinary Tract: No masses identified. No evidence of
hydronephrosis.

Stomach/Bowel: Visualized portions within the abdomen are
unremarkable.

Vascular/Lymphatic: No pathologically enlarged lymph nodes
identified. Aortic atherosclerosis. Unchanged aneurysm of the
infrarenal abdominal aorta measuring 3.0 x 3.0 cm (series 20, image
64).

Other:  None.

Musculoskeletal: No suspicious bone lesions identified.
IMPRESSION: 1. Status post cholecystectomy. Unchanged postoperative biliary
ductal dilatation.
2. No MR abnormality of the stomach per reported history of gastric
ulcer and GI bleed. Please note that evaluation of the stomach by MR
is generally very limited.
3. Small bilateral pleural effusions.
4. Unchanged aneurysm of the infrarenal abdominal aorta measuring
3.0 x 3.0 cm. Recommend follow-up ultrasound every 3 years if
clinically appropriate. This recommendation follows ACR consensus
guidelines: White Paper of the ACR Incidental Findings Committee II
Atherosclerosis ([8X]-[8X]).

## 2020-09-27 MED ORDER — GADOBUTROL 1 MMOL/ML IV SOLN
5.0000 mL | Freq: Once | INTRAVENOUS | Status: AC | PRN
  Administered 2020-09-27: 5 mL via INTRAVENOUS

## 2020-10-01 ENCOUNTER — Telehealth: Payer: Self-pay | Admitting: Gastroenterology

## 2020-10-01 ENCOUNTER — Other Ambulatory Visit: Payer: Self-pay

## 2020-10-01 MED ORDER — METRONIDAZOLE 500 MG PO TABS: 500.0000 mg | ORAL_TABLET | Freq: Three times a day (TID) | ORAL | 0 refills | Status: DC

## 2020-10-01 NOTE — Telephone Encounter (Signed)
Spoke with patient's daughter, Bev, discussed in detail recommendations per Dr. Havery Moros. Daughter states that patient is taking Protonix twice a day and denies any bloating or gas. She would like to try Flagyl for a week, and patient has been scheduled for a follow up on 10/06/20 at 9:20 AM. Daughter verbalized understanding that if patient experiences severe pain, intolerance to things by mouth, fever, severe headache, etc, he will need to be evaluated urgently in the ED.   Dr. Loni Muse, just an FYI Confirmed with daughter that patient does not have an allergy to Flagyl - has never had an anaphylactic reaction or respiratory distress, she states that he has taken this several times. Daughter had no other concerns at the end of the call.  Prescription sent to pharmacy - daughter confirmed pharmacy on file.

## 2020-10-01 NOTE — Telephone Encounter (Signed)
Spoke with patient's daughter, Bev, she reports that patient started having abdominal pain on Tuesday/Wednesday, intense pain in stomach - comes in burst and then continuous, one night episode of vomiting, diarrhea for 1 week - has returned back to normal. Pt also reported that he had a headache -described as the worst headache he has ever felt, she states that he thought he was having a stroke, headache eased off today. Pt requesting medication for abdominal pain, she states that he has not taken dicyclomine before. Please advise on recommendations, thank you.  Bev stated that if she does not answer please leave a detailed message.

## 2020-10-01 NOTE — Telephone Encounter (Signed)
Not sure what to make of this over the phone. He just had an MRCP / MRI of the abdomen and looked okay without anything acute a few days ago. He's had an EGD per Dr. Hilarie Fredrickson over the summer with ulcers noted, should make sure he is on protonix twice daily as prescribed. He has had recurrent midgut volvulus a few times in 2019, has also been treated for SIBO in the past given small bowel diverticulosis to which he has responded well. If he has severe pain, intolerant of PO, fevers, etc, or his headache comes back that severe, he would need to go to the ED for urgent evaluation. If symptoms mild we can offer some empiric flagyl for which he has had in the past for possible SIBO if he has a lot of gas / bloating but I'm not really sure what we are treating without further evaluation, and not able to see him today given its late Friday afternoon. If they want to try the flagyl it would be 500mg  TID for one week, again not sure if this will help or not, but if he has worsening symptoms, severe pain, etc, he needs to go to the ED.

## 2020-10-04 ENCOUNTER — Ambulatory Visit (HOSPITAL_COMMUNITY)
Admission: RE | Admit: 2020-10-04 | Discharge: 2020-10-04 | Disposition: A | Discharge status: Home / Self Care | Payer: Medicare HMO | Source: Ambulatory Visit | Attending: Cardiology | Admitting: Cardiology

## 2020-10-04 ENCOUNTER — Encounter (HOSPITAL_COMMUNITY): Payer: Self-pay | Admitting: Cardiology

## 2020-10-04 ENCOUNTER — Other Ambulatory Visit: Payer: Self-pay

## 2020-10-04 VITALS — BP 142/80 | HR 68 | Wt 124.6 lb

## 2020-10-04 DIAGNOSIS — G4733 Obstructive sleep apnea (adult) (pediatric): Secondary | ICD-10-CM | POA: Diagnosis not present

## 2020-10-04 DIAGNOSIS — I2510 Atherosclerotic heart disease of native coronary artery without angina pectoris: Secondary | ICD-10-CM | POA: Diagnosis not present

## 2020-10-04 DIAGNOSIS — I6529 Occlusion and stenosis of unspecified carotid artery: Secondary | ICD-10-CM | POA: Diagnosis not present

## 2020-10-04 DIAGNOSIS — Z7982 Long term (current) use of aspirin: Secondary | ICD-10-CM | POA: Diagnosis not present

## 2020-10-04 DIAGNOSIS — J439 Emphysema, unspecified: Secondary | ICD-10-CM | POA: Insufficient documentation

## 2020-10-04 DIAGNOSIS — Z87891 Personal history of nicotine dependence: Secondary | ICD-10-CM | POA: Insufficient documentation

## 2020-10-04 DIAGNOSIS — Z8719 Personal history of other diseases of the digestive system: Secondary | ICD-10-CM | POA: Insufficient documentation

## 2020-10-04 DIAGNOSIS — Z955 Presence of coronary angioplasty implant and graft: Secondary | ICD-10-CM | POA: Insufficient documentation

## 2020-10-04 DIAGNOSIS — Z9049 Acquired absence of other specified parts of digestive tract: Secondary | ICD-10-CM | POA: Insufficient documentation

## 2020-10-04 DIAGNOSIS — E785 Hyperlipidemia, unspecified: Secondary | ICD-10-CM | POA: Diagnosis not present

## 2020-10-04 DIAGNOSIS — I340 Nonrheumatic mitral (valve) insufficiency: Secondary | ICD-10-CM | POA: Diagnosis not present

## 2020-10-04 DIAGNOSIS — I080 Rheumatic disorders of both mitral and aortic valves: Secondary | ICD-10-CM | POA: Insufficient documentation

## 2020-10-04 DIAGNOSIS — I5022 Chronic systolic (congestive) heart failure: Secondary | ICD-10-CM | POA: Insufficient documentation

## 2020-10-04 DIAGNOSIS — I255 Ischemic cardiomyopathy: Secondary | ICD-10-CM | POA: Diagnosis present

## 2020-10-04 DIAGNOSIS — N183 Chronic kidney disease, stage 3 unspecified: Secondary | ICD-10-CM | POA: Insufficient documentation

## 2020-10-04 DIAGNOSIS — Z79899 Other long term (current) drug therapy: Secondary | ICD-10-CM | POA: Diagnosis not present

## 2020-10-04 LAB — BASIC METABOLIC PANEL
Anion gap: 8 (ref 5–15)
BUN: 23 mg/dL (ref 8–23)
CO2: 21 mmol/L — ABNORMAL LOW (ref 22–32)
Calcium: 8.1 mg/dL — ABNORMAL LOW (ref 8.9–10.3)
Chloride: 110 mmol/L (ref 98–111)
Creatinine, Ser: 1.69 mg/dL — ABNORMAL HIGH (ref 0.61–1.24)
GFR, Estimated: 39 mL/min — ABNORMAL LOW (ref >60)
Glucose, Bld: 108 mg/dL — ABNORMAL HIGH (ref 70–99)
Potassium: 4.9 mmol/L (ref 3.5–5.1)
Sodium: 139 mmol/L (ref 135–145)

## 2020-10-04 LAB — LIPID PANEL
Cholesterol: 106 mg/dL (ref 0–200)
HDL: 34 mg/dL — ABNORMAL LOW (ref >40)
LDL Cholesterol: 52 mg/dL (ref 0–99)
Total CHOL/HDL Ratio: 3.1 RATIO
Triglycerides: 99 mg/dL (ref <150)
VLDL: 20 mg/dL (ref 0–40)

## 2020-10-04 MED ORDER — POTASSIUM CHLORIDE ER 10 MEQ PO TBCR: 10.0000 meq | EXTENDED_RELEASE_TABLET | Freq: Every day | ORAL | 3 refills | Status: AC

## 2020-10-04 MED ORDER — FUROSEMIDE 20 MG PO TABS: 20.0000 mg | ORAL_TABLET | Freq: Every day | ORAL | 3 refills | Status: AC

## 2020-10-04 NOTE — Patient Instructions (Addendum)
INCREASE Lasix 20mg  (1 tablet) Daily  CONTINUE KCL 80meq (1 tablet) daily  Labs done today, your results will be available in MyChart, we will contact you for abnormal readings.  Your physician recommends that you schedule repeat labs in 10-14 days  Your physician recommends that you schedule a follow-up appointment in: 4 months  If you have any questions or concerns before your next appointment please send [place] a message through [place] or call our office at [phone].    TO LEAVE A MESSAGE FOR THE NURSE SELECT OPTION 2, PLEASE LEAVE A MESSAGE INCLUDING: . YOUR NAME . DATE OF BIRTH . CALL BACK NUMBER . REASON FOR CALL**this is important as we prioritize the call backs  YOU WILL RECEIVE A CALL BACK THE SAME DAY AS LONG AS YOU CALL BEFORE 4:00 PM

## 2020-10-04 NOTE — Progress Notes (Signed)
Advanced Heart Failure Clinic Note   PCP: Dr. Sallyanne Havers [facility]) Cardiology: Dr. Aundra Dubin  86 y.o. male with history of recurrent small bowel obstruction and CAD with ischemic cardiomyopathy presents for followup.  In 9/14, patient was hospitalized with recurrent abdominal pain from small bowel obstruction.  This was managed conservatively with NG tube/NPO and IV fluid.  In the hospital, the patient developed pulmonary edema and actually ended up being intubated.  Troponin was normal. Echo was done showing EF 35-40% with anteroseptal, anterolateral, and apical akinesis along with moderate AI.  He had no prior known cardiac disease.  It was thought that the patient had had an out of hospital MI at some point then developed pulmonary edema with IVF while NPO.  He was diuresed and extubated.  Creatinine was up to about 1.6 in the hospital so no cardiac cath was done (not thought to be urgent given normal creatinine).   I took him for [facility] in 11/14.  This showed 95% mid LAD stenosis that was treated with 2 overlapping Promus DES.  Echo in 2/15 showed EF 30-35% with normal RV.  Coreg was cut back to 6.25 bid due to bradycardia and fatigue.   Had R CEA in 11/16 with Dr Trula Slade. He developed gynecomastia and had to stop spironolactone.  Eplerenone caused nausea/vomiting.    He was admitted in 12/18 with SBO, had ex lap with lysis of adhesions.  He was admitted again in 2/19 with SBO, again with ex lap and lysis of adhesions.    Lasix was stopped due to orthostatic symptoms, and he was admitted in 4/19 with dyspnea, found to have CHF.  He got IV Lasix and was restarted on po Lasix for home.   Echo in 4/19 with EF 30-35%, moderate LVH, mild to moderate AI, moderate MR.    In 5/21, he was admitted with recurrent SBO. In 7/21, he was admitted with PNA and GI bleeding from PUD.  In 8/21, he was admitted with AKI in setting of poor po intake (creatinine up to 3.25).    He continues to follow with oncology  for treatment of CML.   Echo in 9/21 showed EF 30-35%, mild LVH, normal RV, at least moderate and possibly severe mitral regurgitation, moderate AI. CT chest in 9/21 showed emphysema and a LUL consolidation with air-fluid levels in a bulla consistent with small abscess.  TEE was done in 9/21 showing EF 30-35%, mid-apical anteroseptal and anterior akinesis, normal RV, mild-moderate MR, moderate AI, no AS.   He returns today for HF follow up. Weight is up about 9 lbs.  He has increased ankle edema.  Mild dyspnea walking 100-200 feet.  No orthopnea/PND.  No chest pain. Uses walker for stability.  Taking Lasix prn, uses about once a week.    Labs (9/14): K 3.4, creatinine 1.55 => 1.4, LDL 89, HDL 38 Labs (11/14): K 4.9, creatinine 1.6 Labs (1/15): K 4.1, creatinine 1.4 Labs (2/15): LDL 34, HDL 32 Labs (2/16): K 4.5, creatinine 1.5 Labs (8/16): LDL 26, HDL 33 Labs (11/16): K 3.4, creatinine 1.49 Labs (4/17): K 4, creatinine 1.65 Labs (4/18): K 4.4, creatinine 1.4 Labs (5/18): K 4.3, creatinine 1.68, LDL 21, HDL 23 Labs (4/19): K 4.2, creatinine 1.4, LDL 72 Labs (4/21): LDL 52 Labs (8/21): K 5.5, creatinine 1.6, hgb 9.3 Labs (9/21): K 4, creatinine 1.26 Labs (10/21): K 4.1, creatinine 1.2  PMH: 1. Nephrolithiasis 2. H/o CCY 3. H/o diverticulitis 4. H/o SBO: initially in 1/14  with small bowel resection, again in 9/14 treated conservatively.  Ex laps for lysis of adhesions in 12/18 and 2/19.  5. Ischemic cardiomyopathy: Echo (9/14) with EF 35-40%, anteroseptal/anterolateral/apical akinesis and moderate AI.  Patient had acute pulmonary edema while hospitalized in 9/14. Echo (2/15) with EF 30-35% with wall motion abnormalities, normal RV size and systolic function, mild MR and mild AI.  - Echo (4/19): EF 30-35% with regional wall motion abnormalities, moderate LVH, mild-moderate AI, moderate MR, PASP 59 mmHg.  - Echo (9/21): EF 30-35%, mild LVH, normal RV, at least moderate and possibly severe  mitral regurgitation, moderate AI.  - TEE (9/21): EF 30-35%, mid-apical anteroseptal and anterior akinesis, normal RV, mild-moderate MR, moderate AI, no AS.  6. CKD stage 3 7. Aortic insufficiency: mild on last echo 8. CAD: LHC (11/14) with 95% mLAD stenosis treated with 2 overlapping Promus DES.  There was residual 50% stenosis beyond the stent margin.   9. Carotid stenosis: 2/16 carotid dopplers with 80-99% RICA stenosis, 40-98% LICA stenosis. CTA neck showed 90% RICA stenosis. Right CEA 10/16.  - Carotid dopplers (9/21): mild bilateral ICA stenosis.  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 COPD: Emphysema on chest CT.  12. Hyperlipidemia: Myalgias with atorvastatin and Crestor.  13. OSA: Does not want CPAP.   SH: widower (2nd time) with 4 children.  Lives in [place].  Previous heavy smoker.  Retired Chief Financial Officer.   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   Review of systems complete and found to be negative unless listed in HPI.     Current Outpatient Medications  Medication Sig Dispense Refill  . acetaminophen (TYLENOL) 325 MG tablet Take 650 mg by mouth every 6 (six) hours as needed for mild pain or headache.    . Ascorbic Acid (VITAMIN C) 500 MG CAPS Take 500 mg by mouth daily.     Marland Kitchen aspirin EC 81 MG tablet Take 1 tablet (81 mg total) by mouth daily. 90 tablet 3  . BOSULIF 400 MG tablet Take 400 mg by mouth daily with breakfast.    . carvedilol (COREG) 6.25 MG tablet Take 1 tablet (6.25 mg total) by mouth 2 (two) times daily. 60 tablet 3  . cyanocobalamin 1000 MCG tablet Take 1,000 mcg by mouth daily.    . diclofenac Sodium (VOLTAREN) 1 % GEL Apply 2 g topically 4 (four) times daily as needed (pain).    Marland Kitchen diphenhydramine-acetaminophen (TYLENOL PM) 25-500 MG TABS tablet Take 2 tablets by mouth at bedtime as needed (sleep).     . ezetimibe (ZETIA) 10 MG tablet Take 1 tablet (10 mg total) by mouth daily. 90 tablet 3  . famotidine (PEPCID) 20 MG tablet Take 20 mg by mouth as needed for  heartburn or indigestion.    . Ferrous Sulfate (IRON) 325 (65 Fe) MG TABS Take 325 mg by mouth daily.     . furosemide (LASIX) 20 MG tablet Take 1 tablet (20 mg total) by mouth daily. 30 tablet 3  . hydrocortisone cream 1 % Apply 1 application topically 2 (two) times daily as needed for itching.    . loperamide (IMODIUM) 2 MG capsule Take 2 mg by mouth as needed for diarrhea or loose stools.     . metroNIDAZOLE (FLAGYL) 500 MG tablet Take 1 tablet (500 mg total) by mouth 3 (three) times daily for 7 days. 21 tablet 0  . pantoprazole (PROTONIX) 40 MG tablet Take 1 tablet (40 mg total) by mouth 2 (two) times daily before a meal.  180 tablet 1  . potassium chloride (KLOR-CON) 10 MEQ tablet Take 1 tablet (10 mEq total) by mouth daily. 30 tablet 3   No current facility-administered medications for this encounter.    BP (!) 142/80   Pulse 68   Wt 56.5 kg (124 lb 9.6 oz)   SpO2 92%   BMI 18.40 kg/m  General: NAD Neck: JVP 8 cm, no thyromegaly or thyroid nodule.  Lungs: Mild crackles at bases. CV: Nondisplaced PMI.  Heart regular S1/S2, no S3/S4, 1/6 SEM RUSB.  1+ ankle edema.  No carotid bruit.  Normal pedal pulses.  Abdomen: Soft, nontender, no hepatosplenomegaly, no distention.  Skin: Intact without lesions or rashes.  Neurologic: Alert and oriented x 3.  Psych: Normal affect. Extremities: No clubbing or cyanosis.  HEENT: Normal.   Assessment/Plan: 1. Chronic systolic CHF: Ischemic cardiomyopathy, EF 30-35% on 9/21 echo. Unable to take spironolactone (gynecomastia) or eplerenone (nausea/vomiting).  He has been off losartan as well as Lasix since 8/21 admission with AKI.  NYHA class II-III symptoms, stable.  Mild volume overload on exam.  - Continue Coreg 6.25 mg bid.  - Start Lasix 20 mg daily with KCl 10 daily.  BMET today and in 10 days.   - Hold off on losartan/Entresto with history of AKI and also history of orthostatic symptoms.  2. CAD: S/p DES to mLAD with overlapping Promus stents  in 11/14.  Of note, there was residual 50% stenosis just beyond the stent margin.  No ischemic symptoms.  - Continue ASA 81 mg daily.  - Myalgias with atorvastatin and crestor. Now taking Zetia.  3. Hyperlipidemia: Myalgias with atorvastatin and Crestor, now taking Zetia. Check lipids today.  4. CKD: Stage 3, recent AKI. Recent BMET stable, repeat in 10 days with addition of Lasix.  5. Carotid stenosis: s/p R CEA 09/2015 with Dr Trula Slade.  Stable dopplers in 9/21.    6. OSA: mild OSA on sleep study. Not interested in CPAP.  7. Mitral regurgitation: TEE in 9/21 showed only mild-moderate MR.  8. Pulmonary: Possible abscess LUL on recent CT.  Also with emphysema.  - Followed in pulmonary clinic.  9. Aortic insufficiency: Moderate on TEE in 9/21. Continue to follow.   Followup 4 months.   Loralie Champagne 10/04/2020

## 2020-10-05 ENCOUNTER — Telehealth: Payer: Self-pay | Admitting: Gastroenterology

## 2020-10-05 NOTE — Telephone Encounter (Signed)
Called and spoke to daughter, Bev. Pt is NOT aware that his children would possibly like him referred to Neurology. She asked that we NOT mention this to the patient at all.

## 2020-10-06 ENCOUNTER — Encounter: Payer: Self-pay | Admitting: Gastroenterology

## 2020-10-06 ENCOUNTER — Other Ambulatory Visit (INDEPENDENT_AMBULATORY_CARE_PROVIDER_SITE_OTHER): Payer: Medicare HMO

## 2020-10-06 ENCOUNTER — Ambulatory Visit: Payer: Medicare HMO | Admitting: Gastroenterology

## 2020-10-06 VITALS — BP 160/80 | HR 84 | Ht 68.0 in | Wt 123.1 lb

## 2020-10-06 DIAGNOSIS — K269 Duodenal ulcer, unspecified as acute or chronic, without hemorrhage or perforation: Secondary | ICD-10-CM

## 2020-10-06 DIAGNOSIS — R109 Unspecified abdominal pain: Secondary | ICD-10-CM | POA: Diagnosis not present

## 2020-10-06 DIAGNOSIS — K2270 Barrett's esophagus without dysplasia: Secondary | ICD-10-CM

## 2020-10-06 DIAGNOSIS — R933 Abnormal findings on diagnostic imaging of other parts of digestive tract: Secondary | ICD-10-CM

## 2020-10-06 LAB — HEPATIC FUNCTION PANEL
ALT: 7 U/L (ref 0–53)
AST: 11 U/L (ref 0–37)
Albumin: 3.3 g/dL — ABNORMAL LOW (ref 3.5–5.2)
Alkaline Phosphatase: 89 U/L (ref 39–117)
Bilirubin, Direct: 0.1 mg/dL (ref 0.0–0.3)
Total Bilirubin: 0.3 mg/dL (ref 0.2–1.2)
Total Protein: 6.6 g/dL (ref 6.0–8.3)

## 2020-10-06 LAB — CBC WITH DIFFERENTIAL/PLATELET
Basophils Absolute: 0.1 10*3/uL (ref 0.0–0.1)
Basophils Relative: 0.9 % (ref 0.0–3.0)
Eosinophils Absolute: 0.1 10*3/uL (ref 0.0–0.7)
Eosinophils Relative: 1.2 % (ref 0.0–5.0)
HCT: 37.2 % — ABNORMAL LOW (ref 39.0–52.0)
Hemoglobin: 12.1 g/dL — ABNORMAL LOW (ref 13.0–17.0)
Lymphocytes Relative: 12.4 % (ref 12.0–46.0)
Lymphs Abs: 1.3 10*3/uL (ref 0.7–4.0)
MCHC: 32.6 g/dL (ref 30.0–36.0)
MCV: 90 fl (ref 78.0–100.0)
Monocytes Absolute: 0.9 10*3/uL (ref 0.1–1.0)
Monocytes Relative: 8.7 % (ref 3.0–12.0)
Neutro Abs: 8 10*3/uL — ABNORMAL HIGH (ref 1.4–7.7)
Neutrophils Relative %: 76.8 % (ref 43.0–77.0)
Platelets: 188 10*3/uL (ref 150.0–400.0)
RBC: 4.14 Mil/uL — ABNORMAL LOW (ref 4.22–5.81)
RDW: 18.6 % — ABNORMAL HIGH (ref 11.5–15.5)
WBC: 10.5 10*3/uL (ref 4.0–10.5)

## 2020-10-06 LAB — LIPASE: Lipase: 7 U/L — ABNORMAL LOW (ref 11.0–59.0)

## 2020-10-06 NOTE — Patient Instructions (Addendum)
If you are age 84 or older, your body mass index should be between 23-30. Your Body mass index is 18.72 kg/m. If this is out of the aforementioned range listed, please consider follow up with your Primary Care Provider.  If you are age 58 or younger, your body mass index should be between 19-25. Your Body mass index is 18.72 kg/m. If this is out of the aformentioned range listed, please consider follow up with your Primary Care Provider.   Please go to the lab in the basement of our building to have lab work done as you leave today. Hit "B" for basement when you get on the elevator.  When the doors open the lab is on your left.  We will call you with the results. Thank you.  Due to recent changes in healthcare laws, you may see the results of your imaging and laboratory studies on MyChart before your provider has had a chance to review them.  We understand that in some cases there may be results that are confusing or concerning to you. Not all laboratory results come back in the same time frame and the provider may be waiting for multiple results in order to interpret others.  Please give [place] 48 hours in order for your provider to thoroughly review all the results before contacting the office for clarification of your results.   Decrease Protonix to ONCE a day.  Discontinue Flagyl.  Thank you for entrusting me with your care and for choosing [facility], Dr. [place]

## 2020-10-06 NOTE — Progress Notes (Signed)
HPI :  86 year old male here for a follow-up visit.  He is accompanied by his daughter today.  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  Recall that he was admitted to the hospital in September for an upper GI bleed.  He had a EGD with Dr. Hilarie Fredrickson showing a long segment of Barrett's esophagus, 2 small gastric ulcers, multiple small duodenal ulcers.  Biopsies negative for H. pylori.  Barrett's biopsies have no dysplasia.  He was treated with PPI, continues to take Protonix 40 mg twice daily and denies any heartburn.  States it is working quite well for him.  Daughter called in on November 12 stating the patient had 2 episodes of rather severe abdominal pain last week.  Patient endorses mid abdominal discomfort that appeared to be sporadic and not clearly precipitated by anything.  He previously had some loose stools prior to onset but that has since stopped.  Symptoms appeared short-lived.  Felt nauseated, unclear if he vomited.  He states it felt different from his prior symptoms of midgut volvulus.  He denied any fevers at the time.  He has had bloating and gas with pain in the past and we have empirically treated him for SIBO which has provided a lot of benefit from in the past.  In this light I offered him empiric Flagyl last week.  Sounds like he never got it until last night took a dose last night.  However he has not had pain since last Wednesday, roughly 1 week ago.  He states his abdomen feels fine, he is eating well.  He denies any problems with his abdomen.  No problems with his bowels.  No blood in his stools.  He is not taking any NSAIDs, Tylenol only.  He has had 2 headaches in the past week which were rather severe for him, otherwise is feeling normal.  He did have an MRCP on November 8 which showed some stable biliary ductal dilation thought to be related to  postcholecystectomy state.  His LFTs have historically been normal.  He had his gallbladder removed several years ago and his biliary tree dilation has been stable on imaging in recent years.  There is no mass in the pancreas or other concerning findings like that.   See prior notes for details of his case.  He has declined colonoscopy in the past as a friend has had a bowel perforation from colonoscopy and is not willing to accept the risks at his age.  No prior colonoscopy. He has had a prior flex sig he thinks 30 years ago.   Echo 07/27/20 - EF 30-35% which is stable range but concern for at least moderate, possibly severe MR. Think he should have an appointment to discuss, may need TEE to evaluate more closely  TEE 08/11/20 - EF 30-35%, mild to moderate MR  MRCP 09/28/20 - IMPRESSION: 1. Status post cholecystectomy. Unchanged postoperative biliary ductal dilatation. 2. No MR abnormality of the stomach per reported history of gastric ulcer and GI bleed. Please note that evaluation of the stomach by MR is generally very limited. 3. Small bilateral pleural effusions. 4. 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 Recommend follow-up ultrasound every 3 years if clinically appropriate. This recommendation follows ACR consensus guidelines: White Paper of the ACR Incidental Findings Committee II on Vascular Findings. J Am Coll Radiol 2013; 10:789-794. Aortic Atherosclerosis (  ICD10-I70.0).   EGD 06/03/20 - The esophagus and gastroesophageal junction were examined with white light and narrow band imaging (NBI) from a forward view and retroflexed position. There were esophageal mucosal changes consistent with long-segment Barrett's esophagus. These changes involved the mucosa at the upper extent of the gastric folds (40 cm from the incisors) extending to the Z-line (35 cm from the incisors). Circumferential salmon-colored mucosa was present from 36 to 40 cm, two  tongues of salmon-colored mucosa were present from 35 to 36 cm and no visible abnormalities were present. The maximum longitudinal extent of these esophageal mucosal changes was 5 cm in length. Mucosa was biopsied with a cold forceps for histology for diagnostic purposes only and to exclude dysplasia. One specimen bottle was sent to pathology. Findings: A 2 cm hiatal hernia was present located 40-42 cm from the incisors. Moderate inflammation characterized by erosions, erythema and shallow ulcerations (2) was found in the gastric body, in the gastric antrum and prepyloric stomach. Biopsies were taken with a cold forceps for histology and Helicobacter pylori testing. No evidence of active bleeding. Five non-bleeding cratered duodenal ulcers with no stigmata of bleeding were found in the duodenal bulb. The largest lesion was 7 mm in largest dimension. There is duodenitis felt peptic in nature in the bulb and duodenal sweep. The second portion of the duodenum was normal.  FINAL MICROSCOPIC DIAGNOSIS:   A. GASTRIC, ANTRUM/BODY, BIOPSY:  - Antral and oxyntic mucosa with slight chronic inflammation.  - Warthin-Starry negative for Helicobacter pylori.  - No intestinal metaplasia, dysplasia or carcinoma.   B. ESOPHAGUS, DISTAL, BIOPSY:  - Intestinal metaplasia consistent with Barrett's esophagus.  - No dysplasia or carcinoma.      Past Medical History:  Diagnosis Date  . Barrett's esophagus   . Carotid artery occlusion   . Chronic systolic CHF (congestive heart failure) ([place])    a. 07/2013 Echo: EF 35-40%  . CKD (chronic kidney disease), stage III ([place])    pt. states that he does not have CKD  . CML (chronic myelocytic leukemia) ([place])   . Complication of anesthesia    Pt states he has a hard time waking up feels like he cant breathe and esophagus is closing up   . Coronary artery disease    a. 09/2013 Cath: LM min irregs, LAD 95p(2.75x20 Promus Premier DES & 3.0x20 Promus Premier DES  overlapping), D1 50-60, LCX 30p, OM1 46m, OM2 60-70p, RCA small, nl.  . Diverticulitis   . Dysphonia 01/08/2017  . History of hiatal hernia   . History of kidney stones   . History of small bowel obstruction   . Hyperlipidemia    denies  . Ischemic cardiomyopathy    a. 07/2013 Echo: EF 35-40%, apical septal, apical lat, apical AK, mod MR.  . Sleep apnea   . Small bowel perforation ([place])    a. 11/2012 s/p emergent SB resection.  . 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     Past Surgical History:  Procedure Laterality Date  . BIOPSY  06/03/2020   Procedure: BIOPSY;  Surgeon: Jerene Bears, MD;  Location: [facility] ENDOSCOPY;  Service: Endoscopy;;  . BOWEL RESECTION     3cm sm intestine  . CARDIAC CATHETERIZATION  10/09/2013  . CAROTID ENDARTERECTOMY    . CHOLECYSTECTOMY    . COLON SURGERY  11/2012   BOWEL SURGERY   . CORONARY ANGIOPLASTY  10/09/2013   MID LAD  . ENDARTERECTOMY Right 09/17/2015   Procedure: Right Carotid ENDARTERECTOMY with Xenosure Patch Angioplasty;  Surgeon: Serafina Mitchell, MD;  Location: [facility] OR;  Service: Vascular;  Laterality: Right;  . ESOPHAGOGASTRODUODENOSCOPY (EGD) WITH PROPOFOL N/A 06/03/2020   Procedure: ESOPHAGOGASTRODUODENOSCOPY (EGD) WITH PROPOFOL;  Surgeon: Jerene Bears, MD;  Location: [facility] ENDOSCOPY;  Service: Endoscopy;  Laterality: N/A;  . EYE SURGERY Bilateral   . INGUINAL HERNIA REPAIR Bilateral   . LAPAROSCOPIC CHOLECYSTECTOMY    . LAPAROTOMY N/A 10/14/2017   Procedure: EXPLORATORY LAPAROTOMY for SMALL BOWEL OBSTRUCTION, cecopexy;  Surgeon: Excell Seltzer, MD;  Location: [facility] ORS;  Service: General;  Laterality: N/A;  . LAPAROTOMY N/A 11/09/2017   Procedure: EXPLORATORY LAPAROTOMY reduction small bowel valvulous;  Surgeon: Rolm Bookbinder, MD;  Location: [facility] ORS;  Service: General;  Laterality: N/A;  . LAPAROTOMY N/A 01/10/2018   Procedure: exploratory laparotomy, lysis of adhesions, decompression of midgut volvulus;  Surgeon: Jovita Kussmaul, MD;  Location: [facility]  ORS;  Service: General;  Laterality: N/A;  . LAPAROTOMY N/A 08/21/2018   Procedure: EXPLORATORY LAPAROTOMY;  Surgeon: Excell Seltzer, MD;  Location: [facility] ORS;  Service: General;  Laterality: N/A;  . LEFT HEART CATHETERIZATION WITH CORONARY ANGIOGRAM N/A 10/09/2013   Procedure: LEFT HEART CATHETERIZATION WITH CORONARY ANGIOGRAM;  Surgeon: Larey Dresser, MD;  Location: [facility] CATH LAB;  Service: Cardiovascular;  Laterality: N/A;  . PERCUTANEOUS CORONARY STENT INTERVENTION (PCI-S)  10/09/2013   Procedure: PERCUTANEOUS CORONARY STENT INTERVENTION (PCI-S);  Surgeon: Larey Dresser, MD;  Location: [facility] CATH LAB;  Service: Cardiovascular;;  . TEE WITHOUT CARDIOVERSION N/A 08/11/2020   Procedure: TRANSESOPHAGEAL ECHOCARDIOGRAM (TEE);  Surgeon: Larey Dresser, MD;  Location: [facility] ENDOSCOPY;  Service: Cardiovascular;  Laterality: N/A;   Family History  Problem Relation Age of Onset  . Emphysema Father        died @ 48- smoked  . Heart disease Father   . Kidney Stones Father   . Diabetes Mother        died @ 3  . Stroke Mother   . Head & neck cancer Brother   . Lymphoma Brother   . Colon cancer Neg Hx   . Stomach cancer Neg Hx   . Rectal cancer Neg Hx   . Esophageal cancer Neg Hx   . Liver cancer Neg Hx    Social History   Tobacco Use  . Smoking status: Former Smoker    Packs/day: 1.00    Years: 45.00    Pack years: 45.00    Types: Cigarettes    Quit date: 06/09/1999    Years since quitting: 21.3  . Smokeless tobacco: Never Used  Vaping Use  . Vaping Use: Never used  Substance Use Topics  . Alcohol use: No    Alcohol/week: 0.0 standard drinks  . Drug use: No   Current Outpatient Medications  Medication Sig Dispense Refill  . acetaminophen (TYLENOL) 325 MG tablet Take 650 mg by mouth every 6 (six) hours as needed for mild pain or headache.    . Ascorbic Acid (VITAMIN C) 500 MG CAPS Take 500 mg by mouth daily.     Marland Kitchen aspirin EC 81 MG tablet Take 1 tablet (81 mg total) by mouth daily.  90 tablet 3  . BOSULIF 400 MG tablet Take 400 mg by mouth daily with breakfast.    . carvedilol (COREG) 6.25 MG tablet Take 1 tablet (6.25 mg total) by mouth 2 (two) times daily. 60 tablet 3  . cyanocobalamin 1000 MCG tablet Take 1,000 mcg by mouth daily.    . diclofenac Sodium (VOLTAREN) 1 % GEL  Apply 2 g topically 4 (four) times daily as needed (pain).    Marland Kitchen diphenhydramine-acetaminophen (TYLENOL PM) 25-500 MG TABS tablet Take 2 tablets by mouth at bedtime as needed (sleep).     . ezetimibe (ZETIA) 10 MG tablet Take 1 tablet (10 mg total) by mouth daily. 90 tablet 3  . famotidine (PEPCID) 20 MG tablet Take 20 mg by mouth as needed for heartburn or indigestion.    . Ferrous Sulfate (IRON) 325 (65 Fe) MG TABS Take 325 mg by mouth daily.     . furosemide (LASIX) 20 MG tablet Take 1 tablet (20 mg total) by mouth daily. 30 tablet 3  . hydrocortisone cream 1 % Apply 1 application topically 2 (two) times daily as needed for itching.    . loperamide (IMODIUM) 2 MG capsule Take 2 mg by mouth as needed for diarrhea or loose stools.     . metroNIDAZOLE (FLAGYL) 500 MG tablet Take 500 mg by mouth 3 (three) times daily. For 7 days started 10/05/2020    . pantoprazole (PROTONIX) 40 MG tablet Take 1 tablet (40 mg total) by mouth 2 (two) times daily before a meal. 180 tablet 1  . potassium chloride (KLOR-CON) 10 MEQ tablet Take 1 tablet (10 mEq total) by mouth daily. 30 tablet 3   No current facility-administered medications for this visit.   Allergies  Allergen Reactions  . Amoxicillin Nausea Only  . Crestor [Rosuvastatin] Itching  . Percocet [Oxycodone-Acetaminophen] Itching  . Spironolactone Other (See Comments)    Nipples sore and swelling   . Metronidazole Other (See Comments)    Insomnia and nervousness     Review of Systems: All systems reviewed and negative except where noted in HPI.    MR 3D Recon At Scanner  Result Date: 09/28/2020 CLINICAL DATA:  Abnormal CT scan, history of gastric  ulcer and GI bleed, reported history of biliary ductal dilatation EXAM: MRI ABDOMEN WITHOUT AND WITH CONTRAST (INCLUDING MRCP) TECHNIQUE: Multiplanar multisequence MR imaging of the abdomen was performed both before and after the administration of intravenous contrast. Heavily T2-weighted images of the biliary and pancreatic ducts were obtained, and three-dimensional MRCP images were rendered by post processing. CONTRAST:  45mL GADAVIST GADOBUTROL 1 MMOL/ML IV SOLN COMPARISON:  CT abdomen pelvis, 06/22/2020 FINDINGS: Lower chest: Small bilateral pleural effusions. Hepatobiliary: No mass or other parenchymal abnormality identified. Status post cholecystectomy. Unchanged postoperative biliary ductal dilatation. Pancreas: No mass, inflammatory changes, or other parenchymal abnormality identified. Spleen:  Within normal limits in size and appearance. Adrenals/Urinary Tract: No masses identified. No evidence of hydronephrosis. Stomach/Bowel: Visualized portions within the abdomen are unremarkable. Vascular/Lymphatic: No pathologically enlarged lymph nodes identified. Aortic atherosclerosis. 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 Other:  None. Musculoskeletal: No suspicious bone lesions identified. IMPRESSION: 1. Status post cholecystectomy. Unchanged postoperative biliary ductal dilatation. 2. No MR abnormality of the stomach per reported history of gastric ulcer and GI bleed. Please note that evaluation of the stomach by MR is generally very limited. 3. Small bilateral pleural effusions. 4. 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 Recommend follow-up ultrasound every 3 years if clinically appropriate. This recommendation follows ACR consensus guidelines: White Paper of the ACR Incidental Findings Committee II on Vascular Findings. J Am Coll Radiol 2013; 10:789-794. Aortic Atherosclerosis (ICD10-I70.0). Electronically Signed    By: Eddie Candle M.D.   On: 09/28/2020 09:13   MR ABDOMEN MRCP W WO CONTAST  Result Date: 09/28/2020 CLINICAL DATA:  Abnormal CT scan, history of gastric ulcer and GI bleed, reported history of biliary ductal dilatation EXAM: MRI ABDOMEN WITHOUT AND WITH CONTRAST (INCLUDING MRCP) TECHNIQUE: Multiplanar multisequence MR imaging of the abdomen was performed both before and after the administration of intravenous contrast. Heavily T2-weighted images of the biliary and pancreatic ducts were obtained, and three-dimensional MRCP images were rendered by post processing. CONTRAST:  78mL GADAVIST GADOBUTROL 1 MMOL/ML IV SOLN COMPARISON:  CT abdomen pelvis, 06/22/2020 FINDINGS: Lower chest: Small bilateral pleural effusions. Hepatobiliary: No mass or other parenchymal abnormality identified. Status post cholecystectomy. Unchanged postoperative biliary ductal dilatation. Pancreas: No mass, inflammatory changes, or other parenchymal abnormality identified. Spleen:  Within normal limits in size and appearance. Adrenals/Urinary Tract: No masses identified. No evidence of hydronephrosis. Stomach/Bowel: Visualized portions within the abdomen are unremarkable. Vascular/Lymphatic: No pathologically enlarged lymph nodes identified. Aortic atherosclerosis. 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 Other:  None. Musculoskeletal: No suspicious bone lesions identified. IMPRESSION: 1. Status post cholecystectomy. Unchanged postoperative biliary ductal dilatation. 2. No MR abnormality of the stomach per reported history of gastric ulcer and GI bleed. Please note that evaluation of the stomach by MR is generally very limited. 3. Small bilateral pleural effusions. 4. 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 Recommend follow-up ultrasound every 3 years if clinically appropriate. This recommendation follows ACR consensus guidelines: White Paper of  the ACR Incidental Findings Committee II on Vascular Findings. J Am Coll Radiol 2013; 10:789-794. Aortic Atherosclerosis (ICD10-I70.0). Electronically Signed   By: Eddie Candle M.D.   On: 09/28/2020 09:13      Physical Exam: BP (!) 160/80 (BP Location: Left Arm, Patient Position: Sitting, Cuff Size: Normal)   Pulse 84   Ht 5\' 8"  (1.727 m)   Wt 123 lb 2 oz (55.8 kg)   BMI 18.72 kg/m  Constitutional: Pleasant, male in no acute distress. Abdominal: Soft, nondistended, nontender.  There are no masses palpable.  Extremities: no edema Lymphadenopathy: No cervical adenopathy noted. Neurological: Alert and oriented to person place and time. Skin: Skin is warm and dry. No rashes noted. Psychiatric: Normal mood and affect. Behavior is normal.   ASSESSMENT AND PLAN: 85 year old male here for reassessment of the following:  Abdominal pain - as above 2 acute episodes of mid abdominal pain last week that was rather severe, apparently short-lived and he has not had any episodes since last Wednesday.  He called then at that time, he had some diarrhea and I offered him empiric Flagyl which he never took.  Symptoms have since resolved and he feels well.  We discussed differential diagnosis of what could have caused that.  He has had relatively recent imaging with MRCP that looks okay.  Given he has felt well for the past week I told him to stop the Flagyl (he just started it last night).  Would monitor for recurrent symptoms.  If he has nausea vomiting and recurrent pain I would recommend a CT scan given his history of midgut volvulus etc.  I do not think related to history of ulcers as these were small and he has been compliant with PPI.  I will check LFTs, lipase, CBC to ensure stable given the symptoms.  Otherwise he will observe and let me know if things recur moving forward.  Barrett's esophagus - long segment of Barrett's esophagus without dysplasia noted during EGD for work-up of GI bleed.  He has  been on Protonix 40 mg twice daily since July.  He has no reflux symptoms and feels well on the regimen.  He endorses he thinks he has had longstanding reflux but never really knew it until he was treated with this.  We discussed Barrett's esophagus and his risk for esophageal cancer in the future.  Given his age, we likely will not pursue further surveillance endoscopy but we can discuss this at his next visit.  I will see him every year for this.  I think okay to reduce his Protonix to 40 mg once daily at this time.  He agreed.  Should he have reflux symptoms that bother him on once daily dosing he should contact me and we can increase back to twice daily.  History of ulcers - H. pylori negative, avoiding NSAIDs.  Given his age and comorbidities, he does not want to have any follow-up endoscopy for surveillance of this and I think that is reasonable.  He will continue Protonix and avoid NSAIDs.  Abnormal MRCP - has some stable biliary ductal dilation on imaging which is most likely due to postcholecystectomy state.  There is no obvious abnormality the pancreas or biliary tree otherwise of concern.  Given his recent symptoms we will repeat LFTs and lipase to make sure okay.   [place] Cellar, MD [facility]

## 2020-10-07 ENCOUNTER — Emergency Department (HOSPITAL_COMMUNITY)
Admission: EM | Admit: 2020-10-07 | Discharge: 2020-10-07 | Disposition: A | Discharge status: Home / Self Care | Payer: Medicare HMO | Attending: Emergency Medicine | Admitting: Emergency Medicine

## 2020-10-07 ENCOUNTER — Emergency Department (HOSPITAL_COMMUNITY): Payer: Medicare HMO

## 2020-10-07 DIAGNOSIS — I5022 Chronic systolic (congestive) heart failure: Secondary | ICD-10-CM | POA: Diagnosis not present

## 2020-10-07 DIAGNOSIS — Z955 Presence of coronary angioplasty implant and graft: Secondary | ICD-10-CM | POA: Diagnosis not present

## 2020-10-07 DIAGNOSIS — R410 Disorientation, unspecified: Secondary | ICD-10-CM | POA: Diagnosis not present

## 2020-10-07 DIAGNOSIS — Z87891 Personal history of nicotine dependence: Secondary | ICD-10-CM | POA: Diagnosis not present

## 2020-10-07 DIAGNOSIS — Z7982 Long term (current) use of aspirin: Secondary | ICD-10-CM | POA: Insufficient documentation

## 2020-10-07 DIAGNOSIS — N183 Chronic kidney disease, stage 3 unspecified: Secondary | ICD-10-CM | POA: Insufficient documentation

## 2020-10-07 DIAGNOSIS — I2510 Atherosclerotic heart disease of native coronary artery without angina pectoris: Secondary | ICD-10-CM | POA: Insufficient documentation

## 2020-10-07 DIAGNOSIS — R4182 Altered mental status, unspecified: Secondary | ICD-10-CM | POA: Diagnosis present

## 2020-10-07 LAB — COMPREHENSIVE METABOLIC PANEL
ALT: 9 U/L (ref 0–44)
AST: 20 U/L (ref 15–41)
Albumin: 2.6 g/dL — ABNORMAL LOW (ref 3.5–5.0)
Alkaline Phosphatase: 73 U/L (ref 38–126)
Anion gap: 10 (ref 5–15)
BUN: 26 mg/dL — ABNORMAL HIGH (ref 8–23)
CO2: 22 mmol/L (ref 22–32)
Calcium: 7.9 mg/dL — ABNORMAL LOW (ref 8.9–10.3)
Chloride: 110 mmol/L (ref 98–111)
Creatinine, Ser: 1.98 mg/dL — ABNORMAL HIGH (ref 0.61–1.24)
GFR, Estimated: 32 mL/min — ABNORMAL LOW (ref >60)
Glucose, Bld: 125 mg/dL — ABNORMAL HIGH (ref 70–99)
Potassium: 5 mmol/L (ref 3.5–5.1)
Sodium: 142 mmol/L (ref 135–145)
Total Bilirubin: 0.6 mg/dL (ref 0.3–1.2)
Total Protein: 5.7 g/dL — ABNORMAL LOW (ref 6.5–8.1)

## 2020-10-07 LAB — URINALYSIS, ROUTINE W REFLEX MICROSCOPIC
Bilirubin Urine: NEGATIVE
Glucose, UA: NEGATIVE mg/dL
Ketones, ur: NEGATIVE mg/dL
Leukocytes,Ua: NEGATIVE
Nitrite: NEGATIVE
Protein, ur: >=300 mg/dL — AB
Specific Gravity, Urine: 1.028 (ref 1.005–1.030)
pH: 5 (ref 5.0–8.0)

## 2020-10-07 LAB — CBC WITH DIFFERENTIAL/PLATELET
Abs Immature Granulocytes: 0.03 10*3/uL (ref 0.00–0.07)
Basophils Absolute: 0 10*3/uL (ref 0.0–0.1)
Basophils Relative: 0 %
Eosinophils Absolute: 0.1 10*3/uL (ref 0.0–0.5)
Eosinophils Relative: 1 %
HCT: 38.2 % — ABNORMAL LOW (ref 39.0–52.0)
Hemoglobin: 11.7 g/dL — ABNORMAL LOW (ref 13.0–17.0)
Immature Granulocytes: 0 %
Lymphocytes Relative: 10 %
Lymphs Abs: 0.9 10*3/uL (ref 0.7–4.0)
MCH: 29.5 pg (ref 26.0–34.0)
MCHC: 30.6 g/dL (ref 30.0–36.0)
MCV: 96.2 fL (ref 80.0–100.0)
Monocytes Absolute: 0.8 10*3/uL (ref 0.1–1.0)
Monocytes Relative: 10 %
Neutro Abs: 6.7 10*3/uL (ref 1.7–7.7)
Neutrophils Relative %: 79 %
Platelets: 207 10*3/uL (ref 150–400)
RBC: 3.97 MIL/uL — ABNORMAL LOW (ref 4.22–5.81)
RDW: 17.4 % — ABNORMAL HIGH (ref 11.5–15.5)
WBC: 8.5 10*3/uL (ref 4.0–10.5)
nRBC: 0 % (ref 0.0–0.2)

## 2020-10-07 IMAGING — DX DG CHEST 1V PORT
1 series · 1 of 1 positions shown · non-contrast
Comparison: Multiple priors include chest CT [DATE] pain chest
radiograph [DATE]

CLINICAL DATA: Altered mental status.

EXAM:
PORTABLE CHEST 1 VIEW

[chest ap]
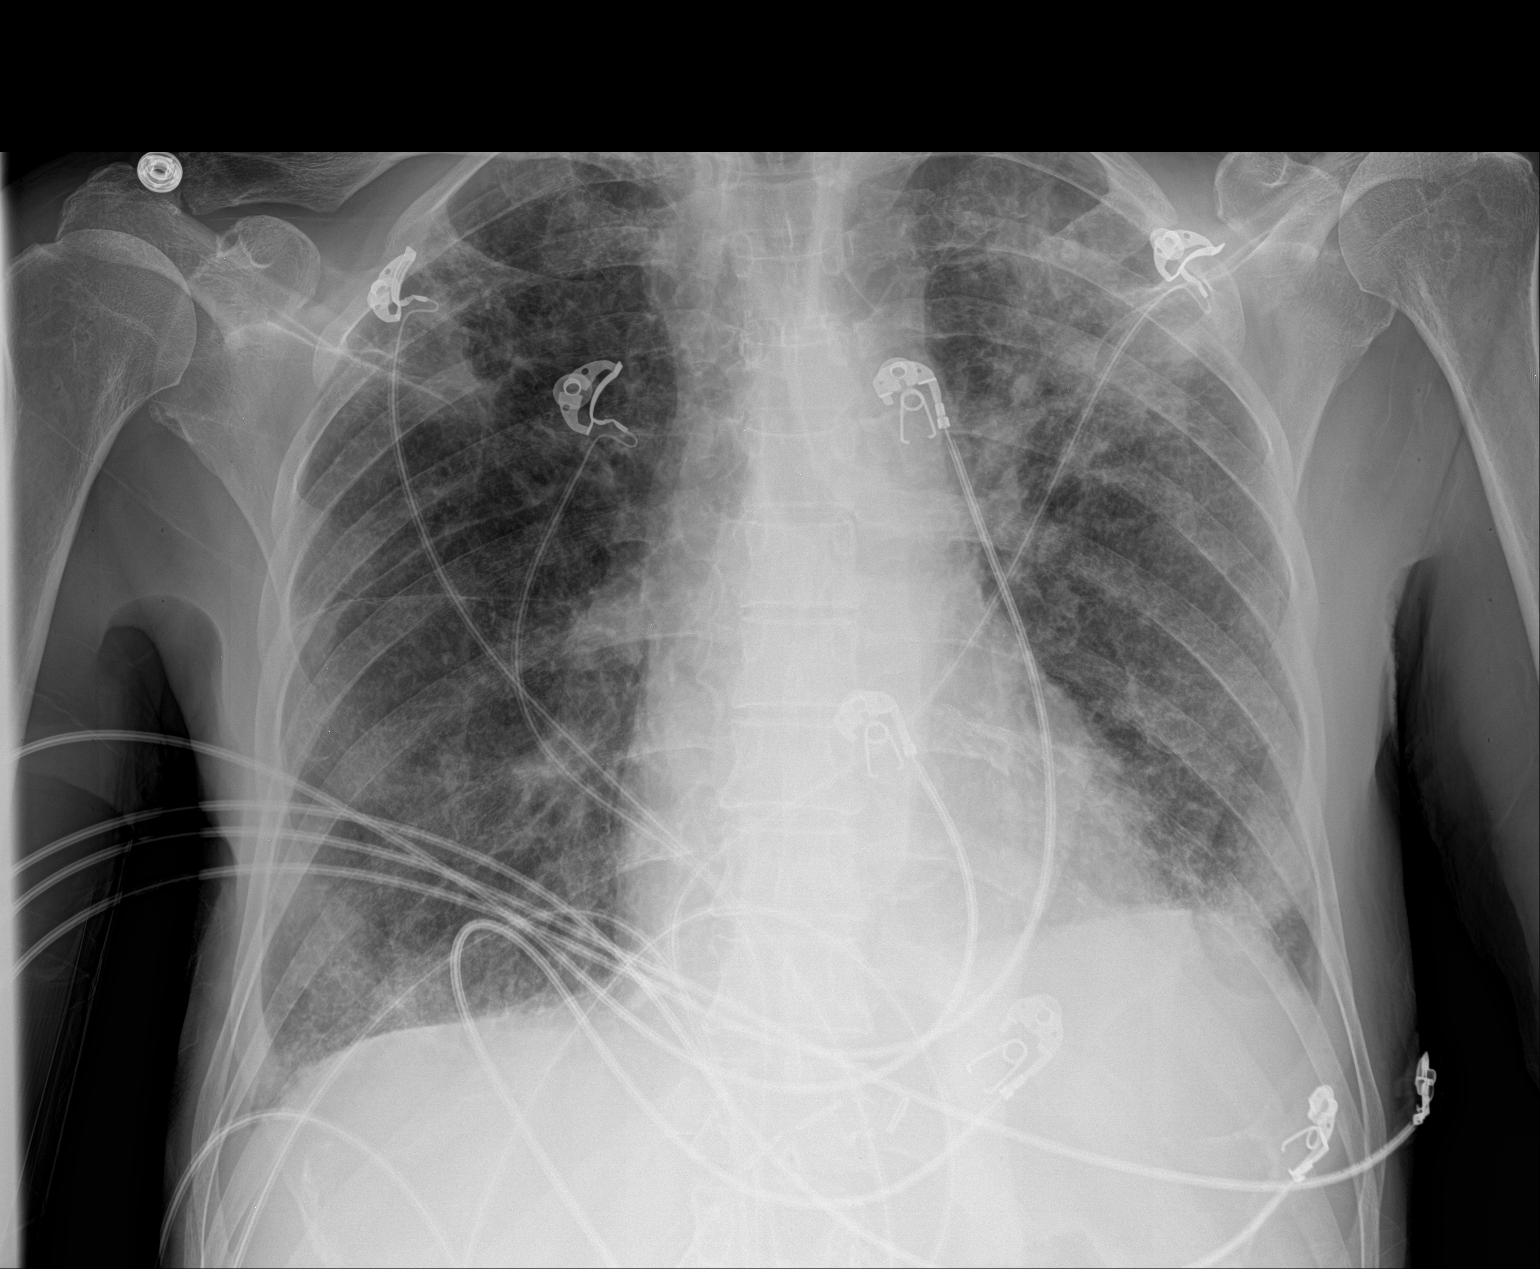

[1 of 1 positions shown; findings below may reference images not displayed]

FINDINGS: The heart size and mediastinal contours are within normal limits.
Opacity in the left greater than right upper lobes and left lower
lobe are similar to chest CT [DATE], and are superimposed
on similar chronic parenchymal lung changes. No definite new focal
consolidation/opacity. No acute osseous abnormality. Surgical clips
overlie the upper abdomen.
IMPRESSION: Similar opacity in the left greater than right upper lobes and left
lower lobe, with no significant change in superimposed chronic
parenchymal lung changes. No definite new focal consolidation.

## 2020-10-07 IMAGING — CT CT HEAD W/O CM
4 series · 16 of 47 positions shown, 18 images · non-contrast
Comparison: None.

CLINICAL DATA: Pt is very agitated, and seems to be confused today.
Pt was normal at [NF] yesterday, when he went to a doctor's appt.;
No injury.

EXAM:
CT HEAD WITHOUT CONTRAST
TECHNIQUE: Contiguous axial images were obtained from the base of the skull
through the vertex without intravenous contrast.

[Series 3: head without · axial · non-contrast · 0.46mm/px · z∈[+1015,+1130]mm · 7 of 31 slices shown, 9 images]
[im 4/31  brain]
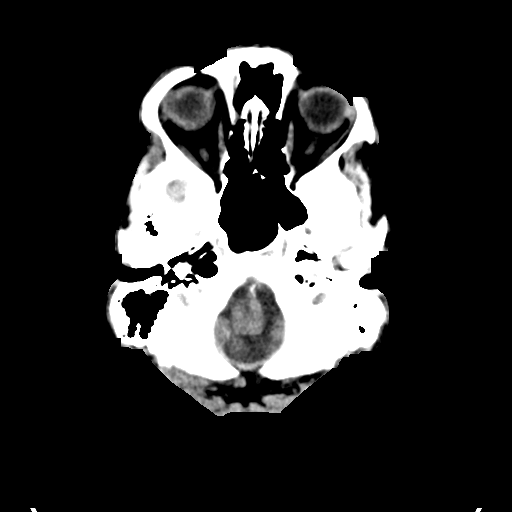
[im 4/31  bone]
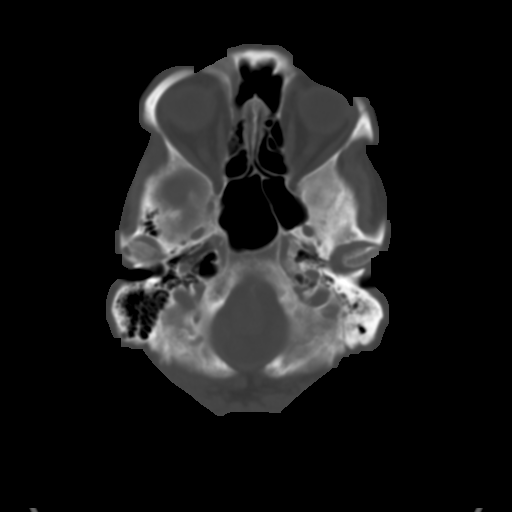
[im 8/31  brain]
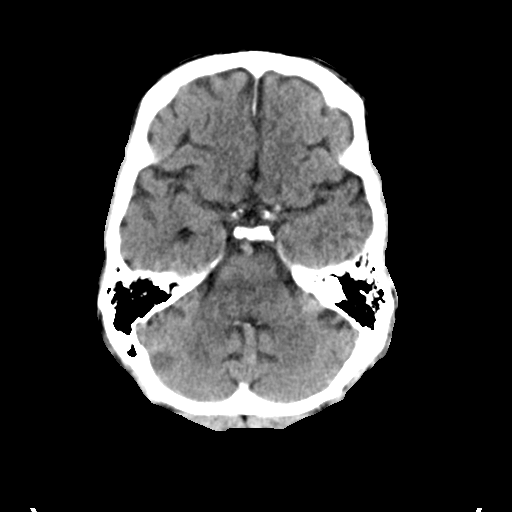
[im 12/31  brain]
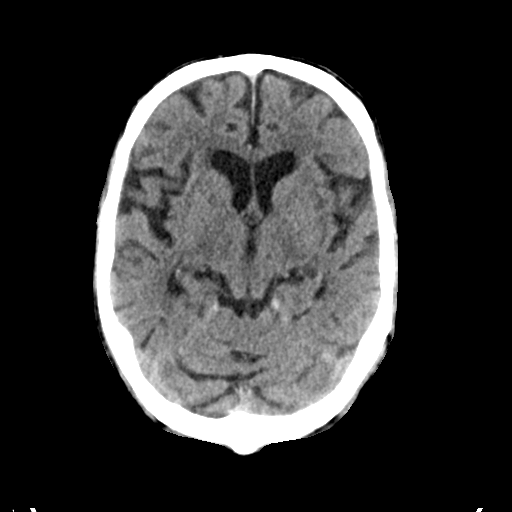
[im 16/31  brain]
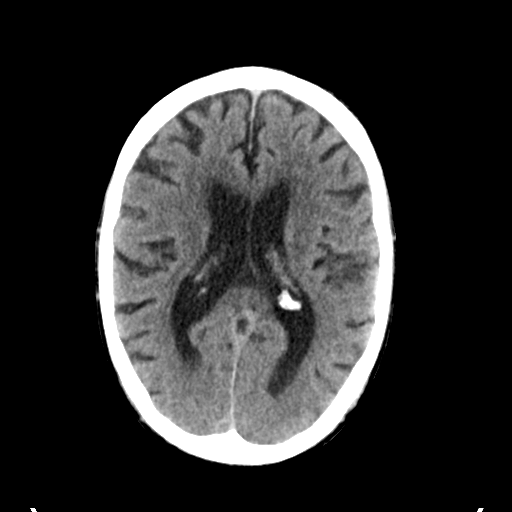
[im 19/31  brain]
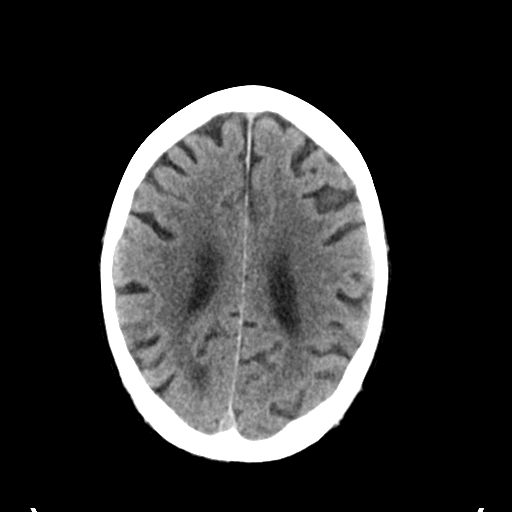
[im 19/31  bone]
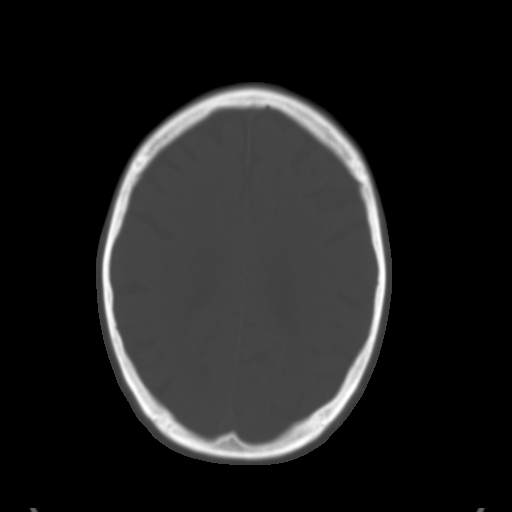
[im 23/31  brain]
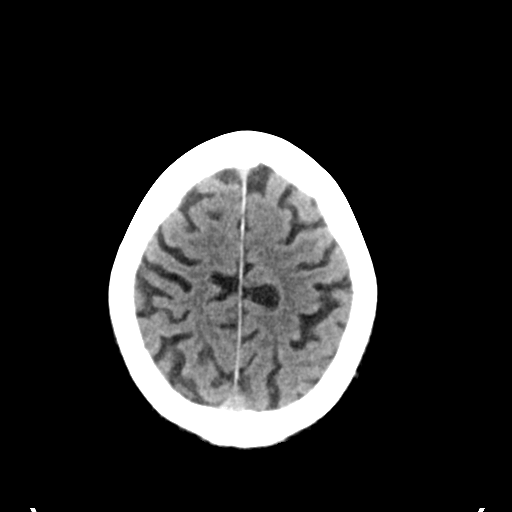
[im 27/31  brain]
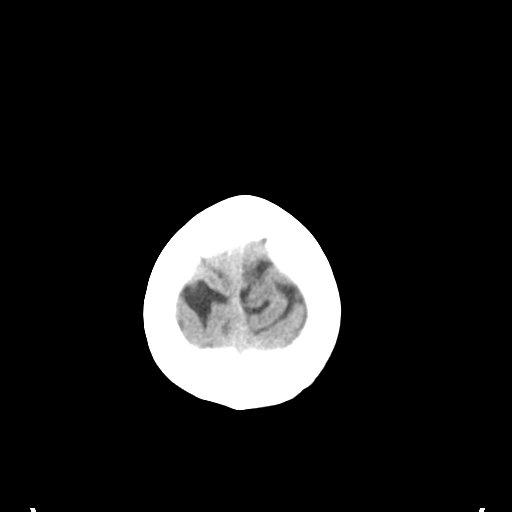

[Series 4: head bone · axial · 0.46mm/px · z∈[+1014,+1044]mm · 3 of 76 slices shown]
[im 8/76  bone]
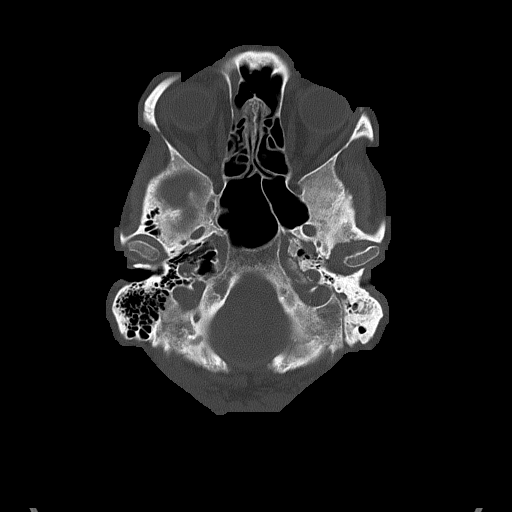
[im 16/76  bone]
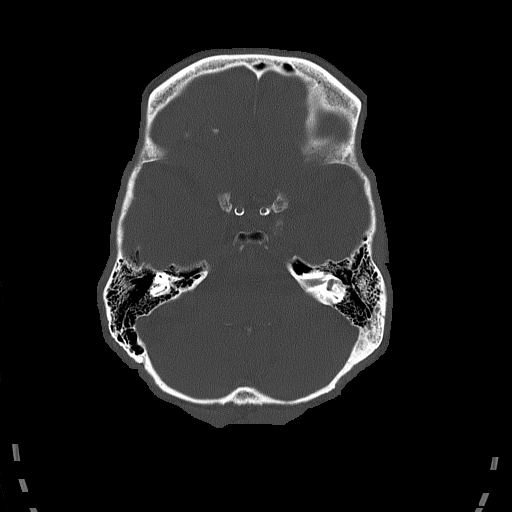
[im 23/76  bone]
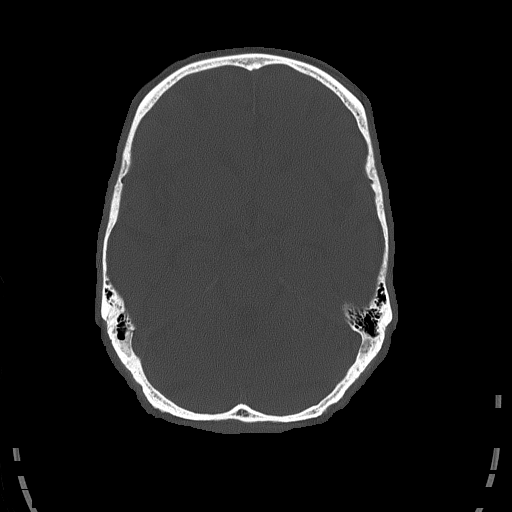

[Series 5: head without cor · coronal · non-contrast · 0.30mm/px · 3 of 73 slices shown]
[im 25/73  brain]
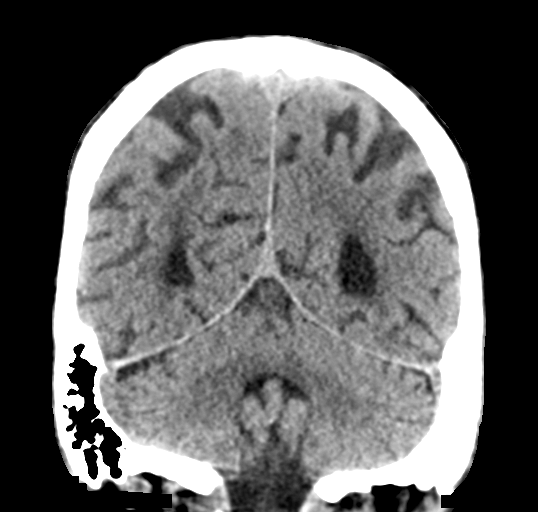
[im 33/73  brain]
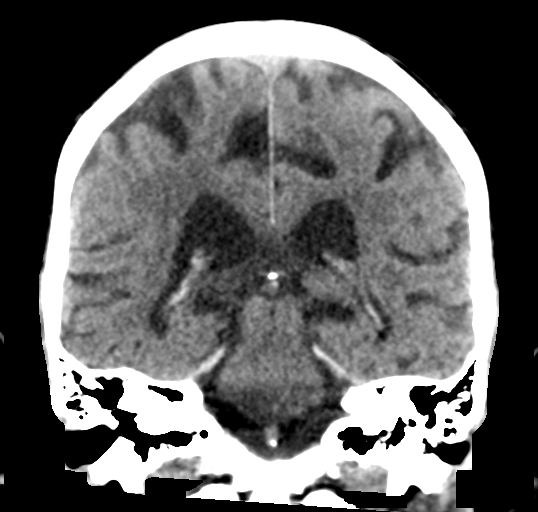
[im 41/73  brain]
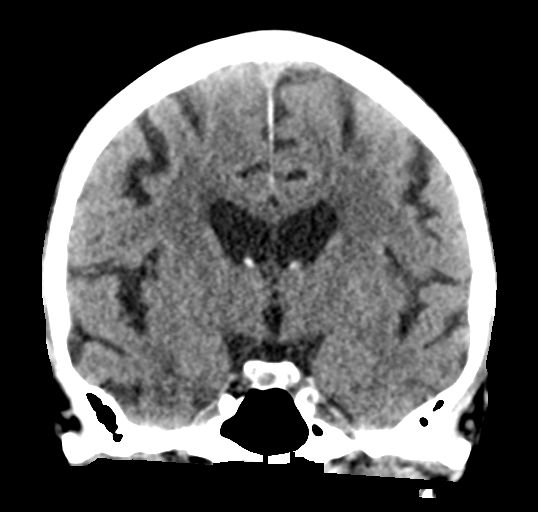

[Series 6: head without sag · sagittal · non-contrast · 0.31mm/px · 3 of 60 slices shown]
[im 20/60  brain]
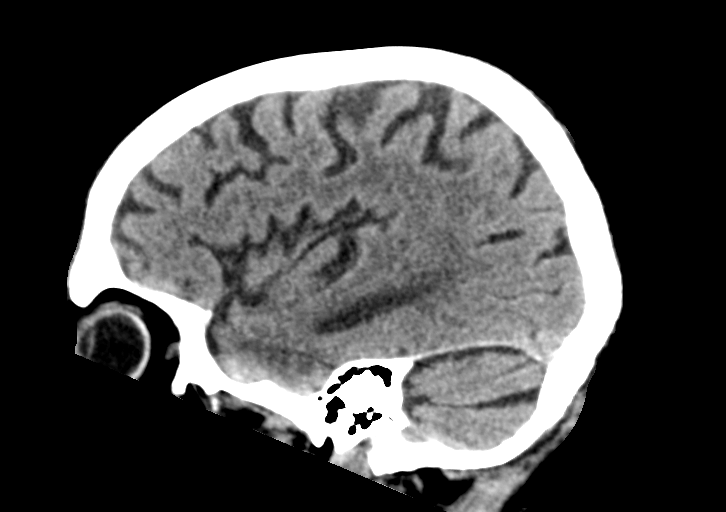
[im 30/60  brain]
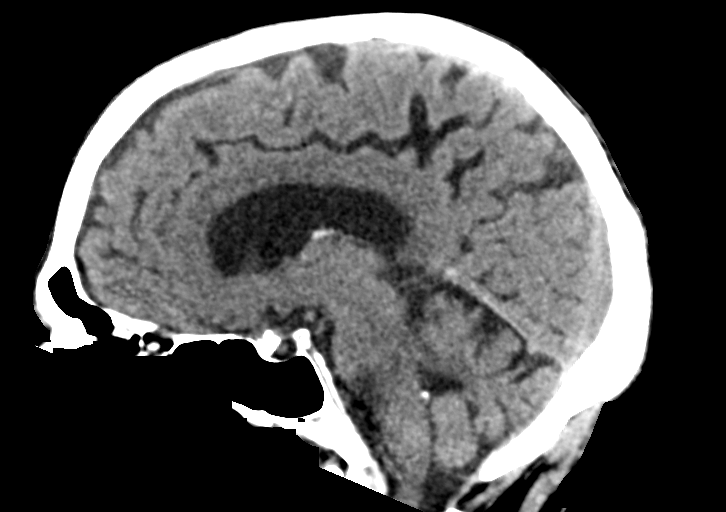
[im 40/60  brain]
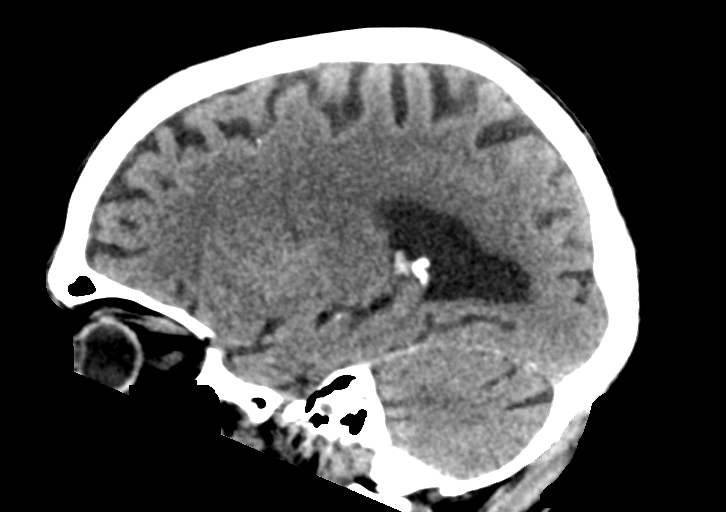

[16 of 47 positions shown; findings below may reference images not displayed]

FINDINGS: Brain: No evidence of acute infarction, hemorrhage, hydrocephalus,
extra-axial collection or mass lesion/mass effect.

Age appropriate ventricular and sulcal enlargement. Mild areas of
white matter hypoattenuation consistent with chronic microvascular
ischemic change.

Vascular: No hyperdense vessel or unexpected calcification.

Skull: Normal. Negative for fracture or focal lesion.

Sinuses/Orbits: Visualized globes and orbits are unremarkable.
Opacified portion of the right frontal sinus with mild right ethmoid
sinus mucosal thickening.

Other: None.
IMPRESSION: 1. No acute intracranial abnormalities.
2. Age-appropriate volume loss and mild chronic microvascular
ischemic change.

## 2020-10-07 IMAGING — MR MR HEAD W/O CM
9 of 10 series · 38 of 48 positions shown · non-contrast
Comparison: Noncontrast head CT performed earlier the same day
[DATE].

CLINICAL DATA: Altered mental status.

EXAM:
MRI HEAD WITHOUT CONTRAST
TECHNIQUE: Multiplanar, multiecho pulse sequences of the brain and surrounding
structures were obtained without intravenous contrast.

[Series 3: DWI · axial · 3.0mm · 1.09mm/px · z∈[-58,+94]mm · 11 of 104 slices shown (1 of 4)]
[im 1/104]
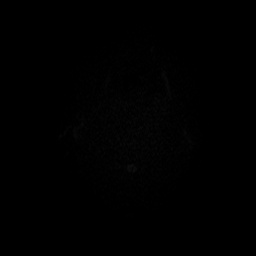
[im 11/104]
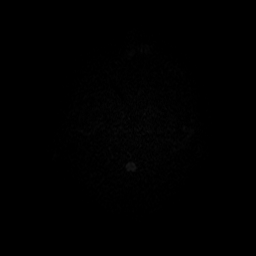
[im 21/104]
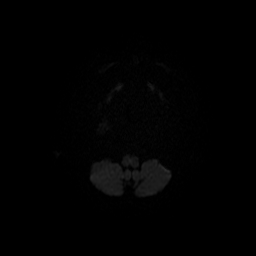
[im 31/104]
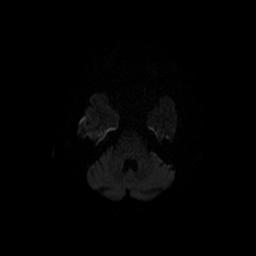
[im 42/104]
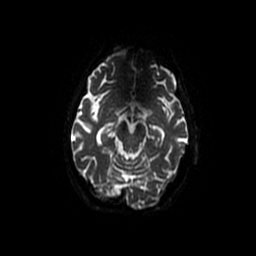
[im 52/104]
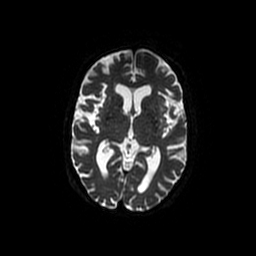
[im 62/104]
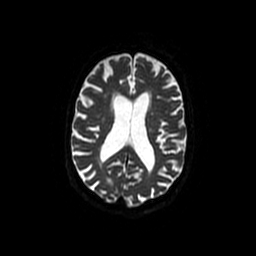
[im 73/104]
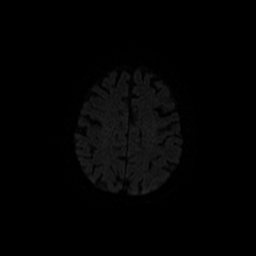
[im 83/104]
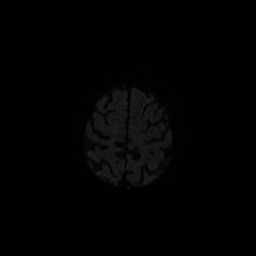
[im 93/104]
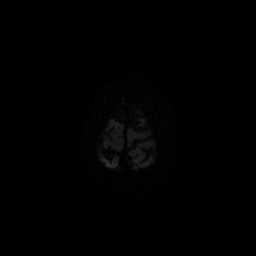
[im 104/104]
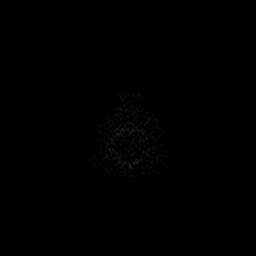

[Series 4: DWI · coronal · 5.0mm · 1.09mm/px · 8 of 80 slices shown (2 of 4)]
[im 1/80]
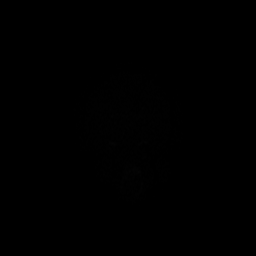
[im 12/80]
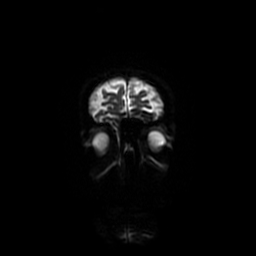
[im 23/80]
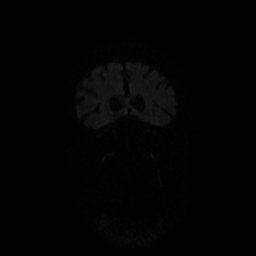
[im 34/80]
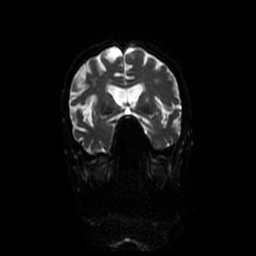
[im 46/80]
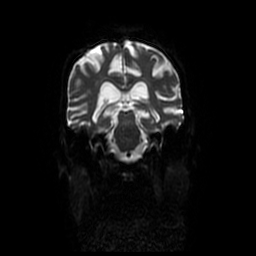
[im 57/80]
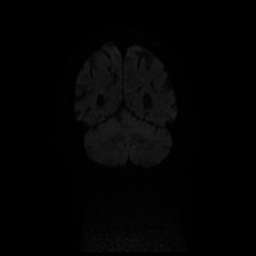
[im 68/80]
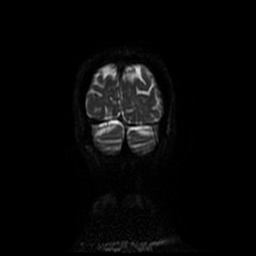
[im 80/80]
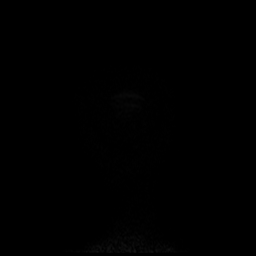

[Series 5: T1 · sagittal · 5.0mm · 0.47mm/px · 2 of 22 slices shown (1 of 2)]
[im 1/22]
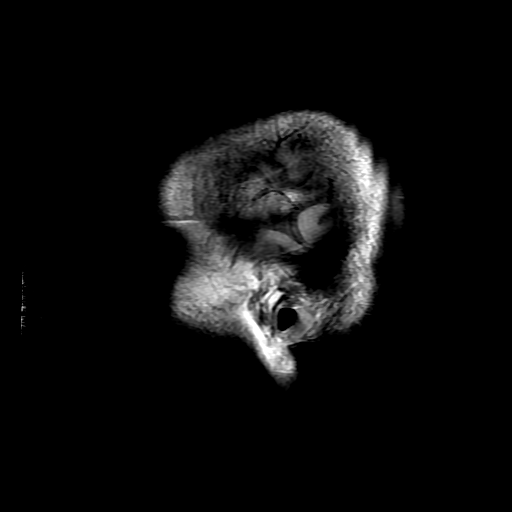
[im 22/22]
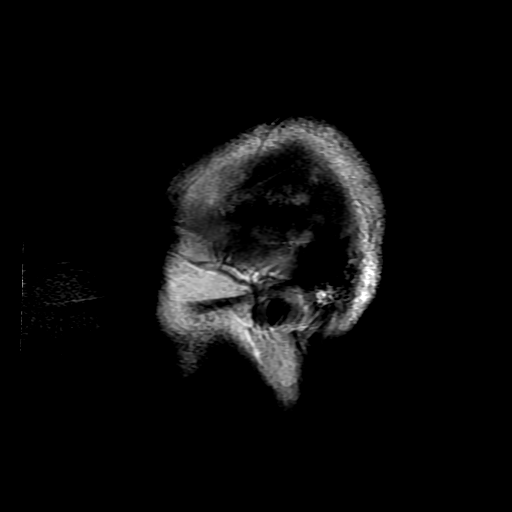

[Series 6: T2 · axial · 5.0mm · 0.43mm/px · z∈[-61,+81]mm · 2 of 25 slices shown (1 of 2)]
[im 1/25]
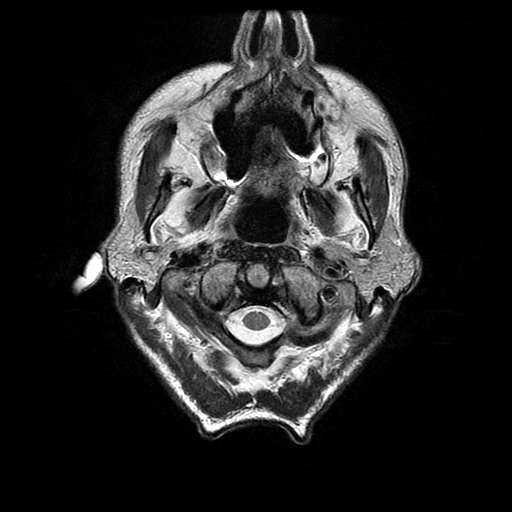
[im 25/25]
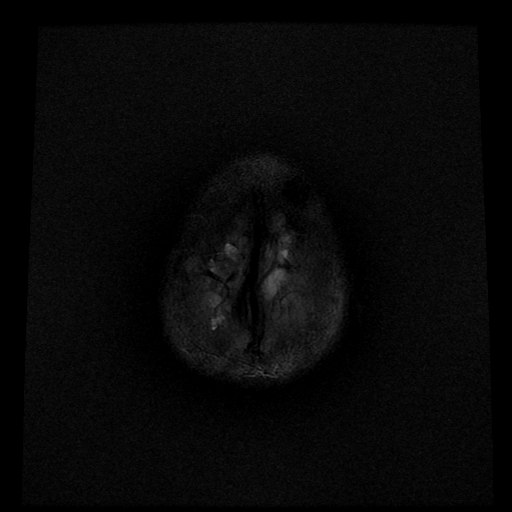

[Series 7: FLAIR · axial · 3.0mm · 0.43mm/px · z∈[-61,+81]mm · 2 of 25 slices shown]
[im 1/25]
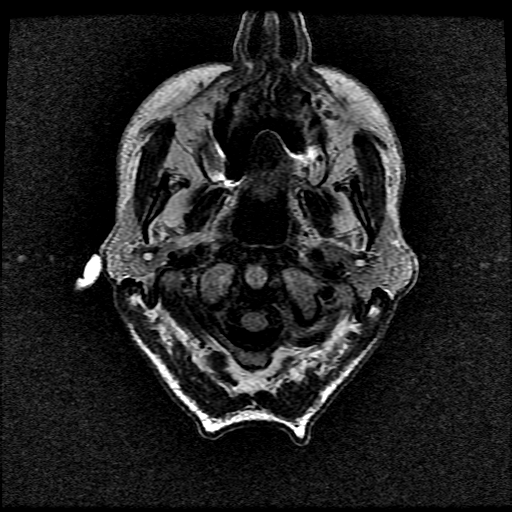
[im 25/25]
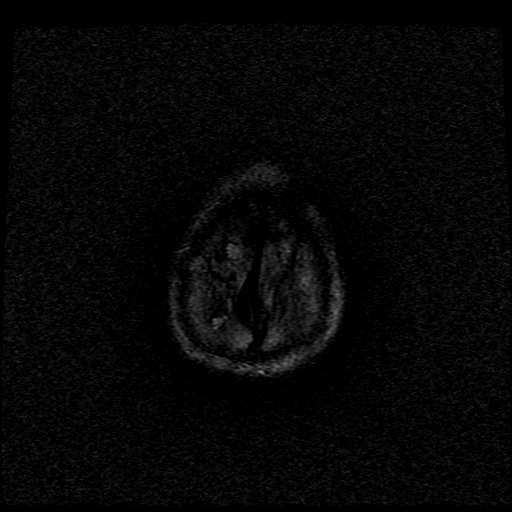

[Series 9: T1 · axial · 3.0mm · 0.47mm/px · z∈[-64,-48]mm · 2 of 100 slices shown (2 of 2)]
[im 1/100]
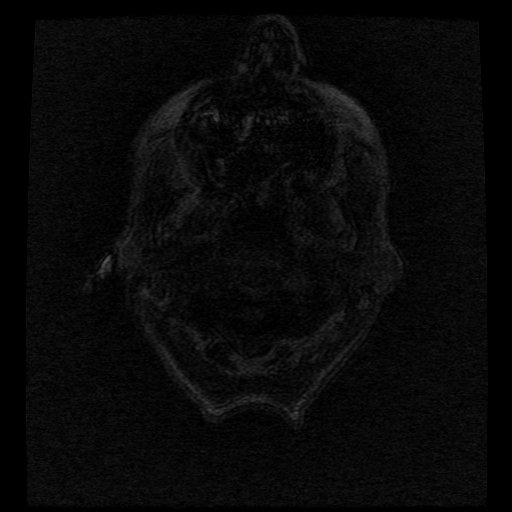
[im 12/100]
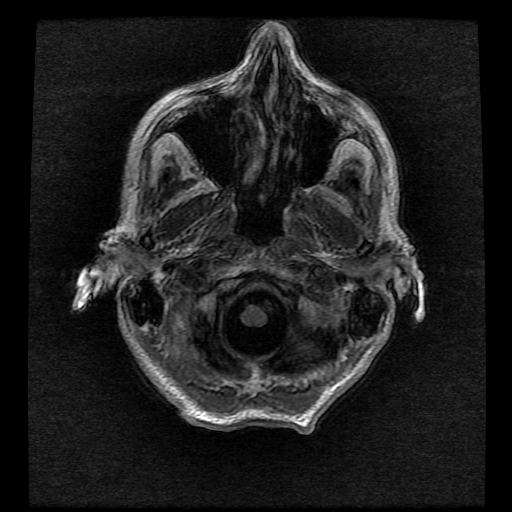

[Series 10: T2 · coronal · 5.0mm · 0.39mm/px · 2 of 25 slices shown (2 of 2)]
[im 1/25]
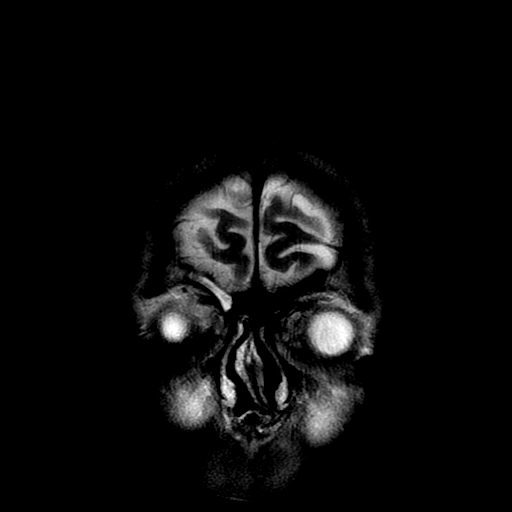
[im 25/25]
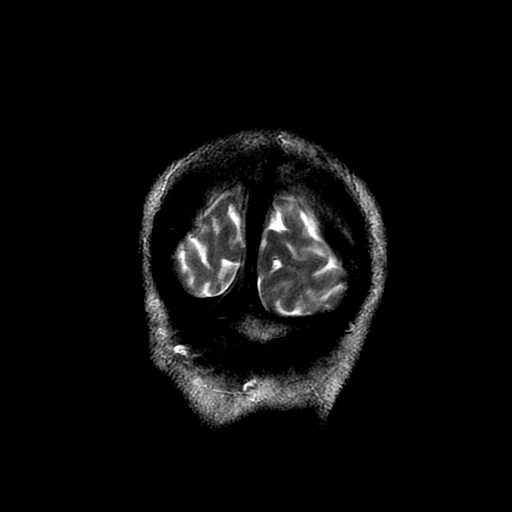

[Series 300: DWI · axial · 3.0mm · 1.09mm/px · z∈[-58,+94]mm · 5 of 52 slices shown (3 of 4)]
[im 1/52]
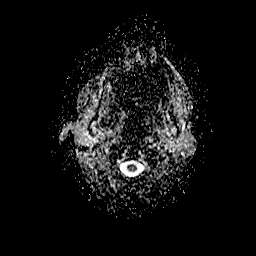
[im 13/52]
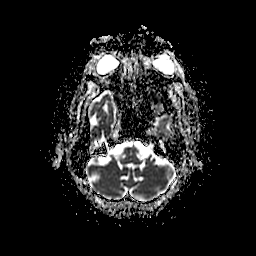
[im 26/52]
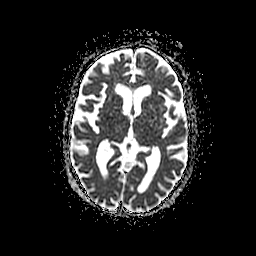
[im 39/52]
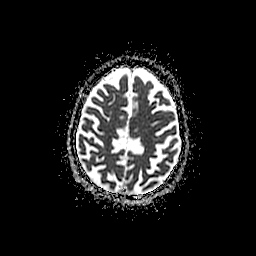
[im 52/52]
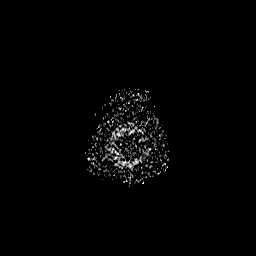

[Series 400: DWI · coronal · 5.0mm · 1.09mm/px · 4 of 40 slices shown (4 of 4)]
[im 1/40]
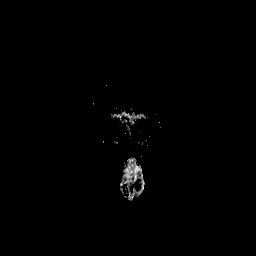
[im 14/40]
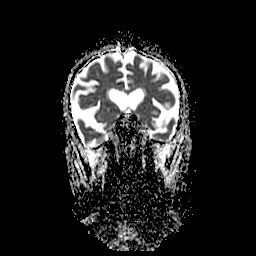
[im 27/40]
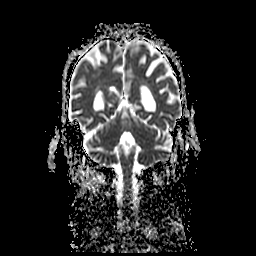
[im 40/40]
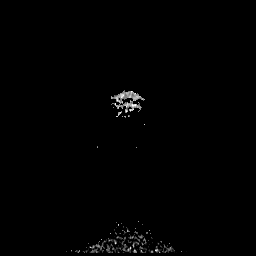

[38 of 48 positions shown; findings below may reference images not displayed]

FINDINGS: Brain:

Mild intermittent motion degradation.

Moderate generalized cerebral atrophy.

Mild multifocal T2/FLAIR hyperintensity within the cerebral white
matter and pons is nonspecific, but compatible with chronic small
vessel ischemic disease.

There is no acute infarct.

No evidence of intracranial mass.

No chronic intracranial blood products.

No extra-axial fluid collection.

No midline shift.

Vascular: Expected proximal arterial flow voids.

Skull and upper cervical spine: No focal marrow lesion

Sinuses/Orbits: Visualized orbits show no acute finding. Paranasal
sinus disease most notably as follows. Complete opacification of the
right frontal sinus. Mild ethmoid sinus mucosal thickening.

Other: Left greater than right mastoid effusions.
IMPRESSION: No evidence of acute intracranial abnormality, including acute
infarction.

Moderate cerebral atrophy with mild-for-age chronic small vessel
ischemic disease.

Ethmoid and right frontal sinusitis.

Left greater than right mastoid effusions.

## 2020-10-07 MED ORDER — SODIUM CHLORIDE 0.9 % IV BOLUS
250.0000 mL | Freq: Once | INTRAVENOUS | Status: AC
  Administered 2020-10-07: 250 mL via INTRAVENOUS

## 2020-10-07 NOTE — ED Provider Notes (Signed)
[facility] EMERGENCY DEPARTMENT Provider Note   CSN: [id number] Arrival date & time: 10/07/20  7793     History Chief Complaint  Patient presents with  . Altered Mental Status    [name] [name] is a 86 y.o. male with PMH/o CKD, CHF, Carotid artery occlusion, CML, HLD brought in by EMS for evaluation of AMS and behavior changes.  Per EMS, patient was last seen normal around 2 PM yesterday.  I talked to son who provides most the history.  He reports that 2 days ago, his sister went to the house and noticed that dad seemed slightly disheveled.  He was doing some things that are not normal for him.  They noticed that he forgot to pray before his meal and that he was trying to use a dirty tissue to wipe his mouth.  She would also notice that he would start talking nonstop in the middle and would forget his words.  He had also been complaining of some abdominal pain so he saw GI yesterday and was discharged home.  Daughter had lunch with him and he seemed to be at baseline but when the other daughter came this morning, they noticed that patient was not dressed as he normally would.  He had some garbled speech and was having difficulty answering questions.  Patient seemed agitated and was not at his baseline, prompting EMS call.  Son states that patient had been complaining of a headache few days ago.  That had since resolved.  He states that right now, he does not feel like his dad is at baseline.  He states that his dad is very regimented and typically follows a routine.  He states that he would never not get dressed in the morning or forget to prep for meal.  Additionally, he feels that patient is not getting all of his words out as he normally would.  He states that normally he is very conversive and cooperative.  EM LEVEL 5 CAVEAT DUE TO AMS  The history is provided by the patient.       Past Medical History:  Diagnosis Date  . Barrett's esophagus   . Carotid artery occlusion    . Chronic systolic CHF (congestive heart failure) ([place])    a. 07/2013 Echo: EF 35-40%  . CKD (chronic kidney disease), stage III ([place])    pt. states that he does not have CKD  . CML (chronic myelocytic leukemia) ([place])   . Complication of anesthesia    Pt states he has a hard time waking up feels like he cant breathe and esophagus is closing up   . Coronary artery disease    a. 09/2013 Cath: LM min irregs, LAD 95p(2.75x20 Promus Premier DES & 3.0x20 Promus Premier DES overlapping), D1 50-60, LCX 30p, OM1 22m, OM2 60-70p, RCA small, nl.  . Diverticulitis   . Dysphonia 01/08/2017  . History of hiatal hernia   . History of kidney stones   . History of small bowel obstruction   . Hyperlipidemia    denies  . Ischemic cardiomyopathy    a. 07/2013 Echo: EF 35-40%, apical septal, apical lat, apical AK, mod MR.  . Sleep apnea   . Small bowel perforation ([place])    a. 11/2012 s/p emergent SB resection.  . 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    Patient Active Problem List   Diagnosis Date Noted  . Barrett's esophagus without dysplasia   . Hiatal hernia   . Heme positive stool   .  Acute blood loss anemia   . Acute gastric ulcer without hemorrhage or perforation   . Duodenal ulcer disease   . Barrett's esophagus with dysplasia   . AKI (acute kidney injury) ([place]) 06/01/2020  . Acute renal failure superimposed on stage 3 chronic kidney disease ([place]) 05/31/2020  . Lung nodule 05/31/2020  . Enteritis 05/31/2020  . Hypocalcemia   . Protein-calorie malnutrition, severe 01/14/2018  . Acidosis, metabolic, with respiratory acidosis 11/10/2017  . Encounter for weaning from ventilator ([place]) 11/10/2017  . CKD (chronic kidney disease), stage III ([place]) 11/10/2017  . Status post exploratory laparotomy 11/09/2017  . Bilateral rales   . 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 Cough 08/17/2016  . Carotid stenosis 09/17/2015  . Right carotid bruit 12/14/2014  . Unstable  angina ([place]) 10/10/2013  . Ischemic cardiomyopathy   . Hyperlipidemia   . Chronic systolic CHF (congestive heart failure) ([place]) 10/03/2013  . CAD (coronary artery disease) 10/03/2013  . Acute systolic heart failure ([place]) 08/11/2013  . Chronic kidney disease (CKD), stage III (moderate) ([facility]) 08/11/2013  . Acute respiratory failure with hypoxia ([place]) 08/09/2013  . Pulmonary edema 08/09/2013  . Small bowel obstruction ([place]) 08/09/2013    Past Surgical History:  Procedure Laterality Date  . BIOPSY  06/03/2020   Procedure: BIOPSY;  Surgeon: Jerene Bears, MD;  Location: [facility] ENDOSCOPY;  Service: Endoscopy;;  . BOWEL RESECTION     3cm sm intestine  . CARDIAC CATHETERIZATION  10/09/2013  . CAROTID ENDARTERECTOMY    . CHOLECYSTECTOMY    . COLON SURGERY  11/2012   BOWEL SURGERY   . CORONARY ANGIOPLASTY  10/09/2013   MID LAD  . ENDARTERECTOMY Right 09/17/2015   Procedure: Right Carotid ENDARTERECTOMY with Xenosure Patch Angioplasty;  Surgeon: Serafina Mitchell, MD;  Location[facility];  Service: Vascular;  Laterality: Right;  . ESOPHAGOGASTRODUODENOSCOPY (EGD) WITH PROPOFOL N/A 06/03/2020   Procedure: ESOPHAGOGASTRODUODENOSCOPY (EGD) WITH PROPOFOL;  Surgeon: Jerene Bears, MD;  Location: [facility] ENDOSCOPY;  Service: Endoscopy;  Laterality: N/A;  . EYE SURGERY Bilateral   . INGUINAL HERNIA REPAIR Bilateral   . LAPAROSCOPIC CHOLECYSTECTOMY    . LAPAROTOMY N/A 10/14/2017   Procedure: EXPLORATORY LAPAROTOMY for SMALL BOWEL OBSTRUCTION, cecopexy;  Surgeon: Excell Seltzer, MD;  Location: [facility] ORS;  Service: General;  Laterality: N/A;  . LAPAROTOMY N/A 11/09/2017   Procedure: EXPLORATORY LAPAROTOMY reduction small bowel valvulous;  Surgeon: Rolm Bookbinder, MD;  Location: [facility] ORS;  Service: General;  Laterality: N/A;  . LAPAROTOMY N/A 01/10/2018   Procedure: exploratory laparotomy, lysis of adhesions, decompression of midgut volvulus;  Surgeon: Jovita Kussmaul, MD;  Location: [facility] ORS;  Service: General;   Laterality: N/A;  . LAPAROTOMY N/A 08/21/2018   Procedure: EXPLORATORY LAPAROTOMY;  Surgeon: Excell Seltzer, MD;  Location: [facility] ORS;  Service: General;  Laterality: N/A;  . LEFT HEART CATHETERIZATION WITH CORONARY ANGIOGRAM N/A 10/09/2013   Procedure: LEFT HEART CATHETERIZATION WITH CORONARY ANGIOGRAM;  Surgeon: Larey Dresser, MD;  Location[facility] CATH LAB;  Service: Cardiovascular;  Laterality: N/A;  . PERCUTANEOUS CORONARY STENT INTERVENTION (PCI-S)  10/09/2013   Procedure: PERCUTANEOUS CORONARY STENT INTERVENTION (PCI-S);  Surgeon: Larey Dresser, MD;  Location: [facility] CATH LAB;  Service: Cardiovascular;;  . TEE WITHOUT CARDIOVERSION N/A 08/11/2020   Procedure: TRANSESOPHAGEAL ECHOCARDIOGRAM (TEE);  Surgeon: Larey Dresser, MD;  Location: [facility] ENDOSCOPY;  Service: Cardiovascular;  Laterality: N/A;       Family History  Problem Relation Age of Onset  .  Emphysema Father        died @ 44- smoked  . Heart disease Father   . Kidney Stones Father   . Diabetes Mother        died @ 3  . Stroke Mother   . Head & neck cancer Brother   . Lymphoma Brother   . Colon cancer Neg Hx   . Stomach cancer Neg Hx   . Rectal cancer Neg Hx   . Esophageal cancer Neg Hx   . Liver cancer Neg Hx     Social History   Tobacco Use  . Smoking status: Former Smoker    Packs/day: 1.00    Years: 45.00    Pack years: 45.00    Types: Cigarettes    Quit date: 06/09/1999    Years since quitting: 21.3  . Smokeless tobacco: Never Used  Vaping Use  . Vaping Use: Never used  Substance Use Topics  . Alcohol use: No    Alcohol/week: 0.0 standard drinks  . Drug use: No    Home Medications Prior to Admission medications   Medication Sig Start Date End Date Taking? Authorizing Provider  acetaminophen (TYLENOL) 325 MG tablet Take 650 mg by mouth every 6 (six) hours as needed for mild pain or headache.   Yes [provider]  Ascorbic Acid (VITAMIN C) 500 MG CAPS Take 500 mg by mouth daily.    Yes  [provider]  aspirin EC 81 MG tablet Take 1 tablet (81 mg total) by mouth daily. 10/02/13  Yes Larey Dresser, MD  BOSULIF 400 MG tablet Take 400 mg by mouth daily with breakfast.   Yes [provider]  carvedilol (COREG) 6.25 MG tablet Take 1 tablet (6.25 mg total) by mouth 2 (two) times daily. 07/02/20  Yes Larey Dresser, MD  cyanocobalamin 1000 MCG tablet Take 1,000 mcg by mouth daily.   Yes [provider]  diphenhydramine-acetaminophen (TYLENOL PM) 25-500 MG TABS tablet Take 2 tablets by mouth at bedtime as needed (sleep).    Yes [provider]  ezetimibe (ZETIA) 10 MG tablet Take 1 tablet (10 mg total) by mouth daily. 04/09/20  Yes Larey Dresser, MD  famotidine (PEPCID) 20 MG tablet Take 20 mg by mouth as needed for heartburn or indigestion.   Yes [provider]  Ferrous Sulfate (IRON) 325 (65 Fe) MG TABS Take 325 mg by mouth daily.    Yes [provider]  furosemide (LASIX) 20 MG tablet Take 1 tablet (20 mg total) by mouth daily. 10/04/20  Yes Larey Dresser, MD  loperamide (IMODIUM) 2 MG capsule Take 2 mg by mouth as needed for diarrhea or loose stools.    Yes [provider]  pantoprazole (PROTONIX) 40 MG tablet Take 1 tablet (40 mg total) by mouth daily. 10/06/20  Yes Armbruster, Carlota Raspberry, MD  potassium chloride (KLOR-CON) 10 MEQ tablet Take 1 tablet (10 mEq total) by mouth daily. 10/04/20  Yes Larey Dresser, MD    Allergies    Amoxicillin, Crestor [rosuvastatin], Percocet [oxycodone-acetaminophen], Spironolactone, and Metronidazole  Review of Systems   Review of Systems  Unable to perform ROS: Mental status change    Physical Exam Updated Vital Signs BP (!) 152/107   Pulse 74   Temp 98.7 F (37.1 C) (Oral)   Resp (!) 21   SpO2 90%   Physical Exam Vitals and nursing note reviewed.  Constitutional:      Appearance: Normal appearance. He is well-developed.  HENT:     Head: Normocephalic and  atraumatic.     Comments: No tenderness to palpation of skull. No deformities or crepitus noted. No open wounds, abrasions or lacerations.  Eyes:     General: Lids are normal.     Conjunctiva/sclera: Conjunctivae normal.     Pupils: Pupils are equal, round, and reactive to light.     Comments: PERRL. EOMs intact. No nystagmus. No neglect.   Cardiovascular:     Rate and Rhythm: Normal rate and regular rhythm.     Pulses: Normal pulses.     Heart sounds: Normal heart sounds. No murmur heard.  No friction rub. No gallop.   Pulmonary:     Effort: Pulmonary effort is normal.     Breath sounds: Normal breath sounds.     Comments: Lungs clear to auscultation bilaterally.  Symmetric chest rise.  No wheezing, rales, rhonchi. Abdominal:     Palpations: Abdomen is soft. Abdomen is not rigid.     Tenderness: There is no abdominal tenderness. There is no guarding.     Comments: Abdomen is soft, non-distended, non-tender. No rigidity, No guarding. No peritoneal signs.  Musculoskeletal:        General: Normal range of motion.     Cervical back: Full passive range of motion without pain.  Skin:    General: Skin is warm and dry.     Capillary Refill: Capillary refill takes less than 2 seconds.  Neurological:     Mental Status: He is alert.     Comments: Alert and oriented x 1. He can tell me his name. When I ask him where he is he responds, "Well that's a silly question," and doesn't answer.  Cranial nerves III-XII intact Follows commands, Moves all extremities  5/5 strength to BUE and BLE  Sensation intact throughout all major nerve distributions No slurred speech. No facial droop.  He can point to his watch when I ask him to but when I point to it and ask him what it is, he won't answer. He can repeat the phrase "You can't teach an old dog new tricks" but not "No ifs ands or buts."   Psychiatric:        Speech: Speech normal.     ED Results / Procedures / Treatments   Labs (all labs  ordered are listed, but only abnormal results are displayed) Labs Reviewed  COMPREHENSIVE METABOLIC PANEL - Abnormal; Notable for the following components:      Result Value   Glucose, Bld 125 (*)    BUN 26 (*)    Creatinine, Ser 1.98 (*)    Calcium 7.9 (*)    Total Protein 5.7 (*)    Albumin 2.6 (*)    GFR, Estimated 32 (*)    All other components within normal limits  CBC WITH DIFFERENTIAL/PLATELET - Abnormal; Notable for the following components:   RBC 3.97 (*)    Hemoglobin 11.7 (*)    HCT 38.2 (*)    RDW 17.4 (*)    All other components within normal limits  URINALYSIS, ROUTINE W REFLEX MICROSCOPIC - Abnormal; Notable for the following components:   APPearance HAZY (*)    Hgb urine dipstick SMALL (*)    Protein, ur >=300 (*)    Bacteria, UA RARE (*)    All other components within normal limits    EKG EKG Interpretation  Date/Time:  Thursday October 07 2020 09:54:31 EST Ventricular Rate:  86 PR Interval:    QRS  Duration: 81 QT Interval:  341 QTC Calculation: 408 R Axis:   87 Text Interpretation: Sinus rhythm Atrial premature complex Borderline right axis deviation Borderline repolarization abnormality No STEMI Confirmed by Octaviano Glow [phone]) on 10/07/2020 10:35:25 AM   Radiology CT Head Wo Contrast  Result Date: 10/07/2020 CLINICAL DATA:  Pt is very agitated, and seems to be confused today. Pt was normal at 1400 yesterday, when he went to a doctor's appt.; No injury. EXAM: CT HEAD WITHOUT CONTRAST TECHNIQUE: Contiguous axial images were obtained from the base of the skull through the vertex without intravenous contrast. COMPARISON:  None. FINDINGS: Brain: No evidence of acute infarction, hemorrhage, hydrocephalus, extra-axial collection or mass lesion/mass effect. Age appropriate ventricular and sulcal enlargement. Mild areas of white matter hypoattenuation consistent with chronic microvascular ischemic change. Vascular: No hyperdense vessel or unexpected  calcification. Skull: Normal. Negative for fracture or focal lesion. Sinuses/Orbits: Visualized globes and orbits are unremarkable. Opacified portion of the right frontal sinus with mild right ethmoid sinus mucosal thickening. Other: None. IMPRESSION: 1. No acute intracranial abnormalities. 2. Age-appropriate volume loss and mild chronic microvascular ischemic change. Electronically Signed   By: Lajean Manes M.D.   On: 10/07/2020 10:56   MR BRAIN WO CONTRAST  Result Date: 10/07/2020 CLINICAL DATA:  Altered mental status. EXAM: MRI HEAD WITHOUT CONTRAST TECHNIQUE: Multiplanar, multiecho pulse sequences of the brain and surrounding structures were obtained without intravenous contrast. COMPARISON:  Noncontrast head CT performed earlier the same day 10/07/2020. FINDINGS: Brain: Mild intermittent motion degradation. Moderate generalized cerebral atrophy. Mild multifocal T2/FLAIR hyperintensity within the cerebral white matter and pons is nonspecific, but compatible with chronic small vessel ischemic disease. There is no acute infarct. No evidence of intracranial mass. No chronic intracranial blood products. No extra-axial fluid collection. No midline shift. Vascular: Expected proximal arterial flow voids. Skull and upper cervical spine: No focal marrow lesion Sinuses/Orbits: Visualized orbits show no acute finding. Paranasal sinus disease most notably as follows. Complete opacification of the right frontal sinus. Mild ethmoid sinus mucosal thickening. Other: Left greater than right mastoid effusions. IMPRESSION: No evidence of acute intracranial abnormality, including acute infarction. Moderate cerebral atrophy with mild-for-age chronic small vessel ischemic disease. Ethmoid and right frontal sinusitis. Left greater than right mastoid effusions. Electronically Signed   By: Kellie Simmering DO   On: 10/07/2020 12:54   DG Chest Portable 1 View  Result Date: 10/07/2020 CLINICAL DATA:  Altered mental status. EXAM:  PORTABLE CHEST 1 VIEW COMPARISON:  Multiple priors include chest CT 01/16/2020 pain chest radiograph July 22, 2020 FINDINGS: The heart size and mediastinal contours are within normal limits. Opacity in the left greater than right upper lobes and left lower lobe are similar to chest CT September 14, 2020, and are superimposed on similar chronic parenchymal lung changes. No definite new focal consolidation/opacity. No acute osseous abnormality. Surgical clips overlie the upper abdomen. IMPRESSION: Similar opacity in the left greater than right upper lobes and left lower lobe, with no significant change in superimposed chronic parenchymal lung changes. No definite new focal consolidation. Electronically Signed   By: Dahlia Bailiff MD   On: 10/07/2020 11:10    Procedures Procedures (including critical care time)  Medications Ordered in ED Medications  sodium chloride 0.9 % bolus 250 mL (0 mLs Intravenous Stopped 10/07/20 1309)    ED Course  I have reviewed the triage vital signs and the nursing notes.  Pertinent labs & imaging results that were available during my care of the patient were reviewed  by me and considered in my medical decision making (see chart for details).  Clinical Course as of Oct 07 1541  Thu Oct 07, 2020  1525 This is an 86 year old male with history of coronary disease presenting to emergency department with garbled speech and expressive aphasia earlier this morning.  The patient lives by himself but his family members frequently check in on him, including his daughter visits him every morning to help with breakfast.  Today she noted that there was a period of time in which he was having difficulty "getting words out".  There are some question about confusion of this.  Upon arrival in the ED, and since his stay, his son at the bedside believes the patient returned to his mental baseline state.  On my exam the patient's voice is clear, he has no expressive aphasia, he has a benign  neurological exam with no focal deficits.  He underwent CT brain followed by MRI of the brain without contrast, which did show signs of sinusitis, but otherwise had no evidence of acute ischemic event or stroke.  Per chart review, the patient has also had an echocardiogram and a vascular Doppler ultrasound of the carotids 2 months ago, which did not show any significant stenosis, vegetations or lesions.  Therefore I believe he has essentially completed a stroke work-up, it would not need hospitalization at this time.  In addition we performed an infectious work-up which shows no evidence of infection in the patient's urinalysis, normal white blood cell count, no signs or symptoms of SIRS or sepsis.   [MT]  1526 Had a long discussion with the patient's son at bedside, as well as his daughter on the phone, and we agreed for discharge home.  The family will watch the patient and stay with him for the next 24 hours.  They also will be checking on him daily.  We discussed the patient's mild dehydration and low albumin today, I encouraged him to drink more fluids and to add boost or Ensure into his diet.  The family and the patient verbalized understanding.  Close return precautions were given, and made it clear that if the patient again has further episodes of expressive aphasia, or any other concerning neurological deficits, they need to bring him back to the ER.   [MT]    Clinical Course User Index [MT] Trifan, Carola Rhine, MD   MDM Rules/Calculators/A&P                          86 year old male who presents for evaluation of altered mental status.  Family saw him today and was concerned that patient was agitated and was having difficulty speaking.  He could answer yes or no questions but was having some garbled speech per daughter.  On EMS arrival, patient was not ataxic and had no weakness.  Speech seemed to improved with EMS.  On initial arrival, he is afebrile, nontoxic-appearing.  Vital signs are stable.  On initial arrival, he can follow some commands.  He has no evidence of slurred speech.  He can tell me his name but has difficulty cooperating with other questions.  He will routinely answer my questions by saying "you are just trying to pick on me" or "that such a silly question, you do not need to know that."  When I ask him to point to his watch, he can identify it but when I point to it until asked him to tell me what it  is, he does not answer.  Additionally, he cannot identify glasses or issue.  He follows some commands.  He can tell me his name but cannot tell me where he is at or what year it is.  He cannot identify his birthday.  I discussed with the son who states that at this time, patient is not at baseline.  There was some concern that he was slightly altered 2 days ago as he did not follow his routine which they state is abnormal for him but the speech difficulties were not noticed until this morning.  They last saw him yesterday at 2 PM.  At this time, he is outside the stroke window for TPA.  Concern for medical process that could cause delirium versus stroke versus intracranial hemorrhage given history of falls.  We will plan to check labs, CT head.  Will need MRI.  CMP shows slight bump in his BUN and creatinine.  Patient given very small amount of fluid given his history of CHF.  UA shows small hemoglobin, negative for leukocytes, infectious ideology.  CBC shows no leukocytosis.  Hemoglobin stable 11.7.  CT of head negative for any acute intracranial hemorrhage.  Chest x-ray shows similar opacity in the left greater than right upper lobes and left lower lobe with no significant change.   MRI brain shows no evidence of acute intracranial abnormality, including acute infarction.  Resting calmly in bed.  He has been ambulatory in the ED.  Patient is hemodynamically stable.  He was evaluated by ED attending.  Patient stated that he did not want to answer questions.  At this time, family is  agreeable to stay with patient.  Daughter had requested a outpatient neurology evaluation from her PCP because she felt like patient was having some worsening forgetfulness.  I discussed with patient son.  He states that has not yet happened.  Will give outpatient neurology referral.  His work-up today is reassuring.  Instructed patient follow-up as outpatient primary care doctor. At this time, patient exhibits no emergent life-threatening condition that require further evaluation in ED. Patient had ample opportunity for questions and discussion. All patient's questions were answered with full understanding. Strict return precautions discussed. Patient expresses understanding and agreement to plan.   Portions of this note were generated with Lobbyist. Dictation errors may occur despite best attempts at proofreading.   Final Clinical Impression(s) / ED Diagnoses Final diagnoses:  Confusion    Rx / DC Orders ED Discharge Orders         Ordered    Ambulatory referral to Neurology       Comments: An appointment is requested in approximately: 4 weeks   10/07/20 1531           Volanda Napoleon, PA-C 10/07/20 1542    Wyvonnia Dusky, MD 10/07/20 1816

## 2020-10-07 NOTE — ED Notes (Signed)
Got patient undress on the monitor did ekg shown to er doctor patient is resting with call bell in reach 

## 2020-10-07 NOTE — ED Triage Notes (Signed)
Pt comes from home via EMS, daughter came to care for pt this morning and noticed altered mental status and behavior change. Pt's speech was "garbled but he could answer yes or no". EMS reports symptoms have partially resolved but pt's behavior is not at baseline, pt is agitated and not cooperative but not aggressive or violent. Last know normal yesterday 1400. Family reports behavior changes over the past weeks with increased agitation and intermittant confusion. Pt denies pain, tingling or numbness. Pt has skin tear to left elbow, denies fall. 20g IV established in LAC. VSS per EMS

## 2020-10-07 NOTE — Discharge Instructions (Signed)
Your work-up today was reassuring.  Your imaging did not show any evidence of a stroke.  Your labs and urine did not show any evidence of infection.  As we discussed, we have a referral for outpatient neurology that you can follow-up with.  Return to emergency department for confusion, difficulty speaking, weakness or any other worsening or concerning symptoms.

## 2020-10-07 NOTE — ED Notes (Signed)
Pt has small skin tear to left elbow that was dressed with guaze and tegaderm by EMS, wound redressed with nonstick telfa pad and kerlex

## 2020-10-08 ENCOUNTER — Encounter (HOSPITAL_COMMUNITY): Payer: Medicare HMO | Admitting: Cardiology

## 2020-10-20 ENCOUNTER — Other Ambulatory Visit (HOSPITAL_COMMUNITY): Payer: Medicare HMO

## 2020-11-03 ENCOUNTER — Telehealth (HOSPITAL_COMMUNITY): Payer: Self-pay

## 2020-11-04 ENCOUNTER — Other Ambulatory Visit (HOSPITAL_COMMUNITY): Payer: Self-pay | Admitting: *Deleted

## 2020-11-04 NOTE — Telephone Encounter (Signed)
Patients daughter called requesting more information about patients condition. Tried calling patients daughter back no answer,lmtrc

## 2020-11-05 ENCOUNTER — Telehealth (HOSPITAL_COMMUNITY): Payer: Self-pay | Admitting: Cardiology

## 2020-11-05 NOTE — Telephone Encounter (Signed)
Per Dr.McLean pt needs to be evaluated in the ED for low O2 saturation. I called pts daughter to advise . no answer

## 2020-11-05 NOTE — Telephone Encounter (Signed)
Bev(daughter) returned call, stating pt really need to see DM  Today if possible. Pt  hands, and feet are swollen Oxygen is 86, he has been out the hospital a week today, need medication consult also, Please advise

## 2020-11-18 ENCOUNTER — Encounter (HOSPITAL_COMMUNITY): Payer: Medicare HMO | Admitting: Cardiology

## 2020-11-23 ENCOUNTER — Telehealth: Payer: Self-pay | Admitting: Gastroenterology

## 2020-11-23 NOTE — Telephone Encounter (Signed)
Inbound call from patient's daughter wanting to inform you that patient is now in hospice.  She knows that he is very fond of you and just wanted to let you be aware.

## 2020-11-23 NOTE — Telephone Encounter (Signed)
So sorry to hear this, thanks for letting me know.

## 2020-11-29 ENCOUNTER — Ambulatory Visit: Payer: Medicare HMO | Admitting: Neurology

## 2021-02-01 ENCOUNTER — Telehealth (HOSPITAL_COMMUNITY): Payer: Self-pay | Admitting: *Deleted

## 2021-02-01 NOTE — Telephone Encounter (Signed)
Pts daughter left VM stating patient passed away on 01/23/21.

## 2021-02-01 NOTE — Telephone Encounter (Signed)
Pt's daughter Bev called to let inform Dr. Havery Moros that her father passed away on 02-21-2023.

## 2021-02-01 NOTE — Telephone Encounter (Signed)
Very sorry to hear this, thank you for letting me know

## 2021-02-08 ENCOUNTER — Encounter (HOSPITAL_COMMUNITY): Payer: Medicare HMO | Admitting: Cardiology

## 2021-02-18 DEATH — deceased
